# Patient Record
Sex: Female | Born: 1935 | Race: White | Hispanic: No | Marital: Married | Smoking: Never smoker
Health system: Southern US, Community
[De-identification: ages and names within clinical notes are randomized; demographics above are authoritative.]

## PROBLEM LIST (undated history)

## (undated) ENCOUNTER — Emergency Department (HOSPITAL_COMMUNITY): Discharge status: Absent without leave | Payer: Self-pay

## (undated) DIAGNOSIS — I4891 Unspecified atrial fibrillation: Secondary | ICD-10-CM

## (undated) DIAGNOSIS — I350 Nonrheumatic aortic (valve) stenosis: Secondary | ICD-10-CM

## (undated) DIAGNOSIS — E079 Disorder of thyroid, unspecified: Secondary | ICD-10-CM

## (undated) DIAGNOSIS — I340 Nonrheumatic mitral (valve) insufficiency: Secondary | ICD-10-CM

## (undated) DIAGNOSIS — I495 Sick sinus syndrome: Secondary | ICD-10-CM

## (undated) DIAGNOSIS — I441 Atrioventricular block, second degree: Secondary | ICD-10-CM

## (undated) DIAGNOSIS — I4819 Other persistent atrial fibrillation: Secondary | ICD-10-CM

## (undated) DIAGNOSIS — I10 Essential (primary) hypertension: Secondary | ICD-10-CM

## (undated) DIAGNOSIS — M353 Polymyalgia rheumatica: Secondary | ICD-10-CM

## (undated) DIAGNOSIS — H269 Unspecified cataract: Secondary | ICD-10-CM

## (undated) DIAGNOSIS — R2689 Other abnormalities of gait and mobility: Secondary | ICD-10-CM

## (undated) DIAGNOSIS — F99 Mental disorder, not otherwise specified: Secondary | ICD-10-CM

## (undated) DIAGNOSIS — Z87898 Personal history of other specified conditions: Secondary | ICD-10-CM

## (undated) DIAGNOSIS — M1990 Unspecified osteoarthritis, unspecified site: Secondary | ICD-10-CM

## (undated) DIAGNOSIS — C801 Malignant (primary) neoplasm, unspecified: Secondary | ICD-10-CM

## (undated) DIAGNOSIS — Z91013 Allergy to seafood: Principal | ICD-10-CM

## (undated) HISTORY — PX: EXCISION, PILONIDAL CYST: SHX000484

## (undated) HISTORY — PX: BIOPSY, BREAST: SHX000098

## (undated) HISTORY — DX: Unspecified atrial fibrillation: I48.91

## (undated) HISTORY — PX: CHOLECYSTECTOMY: SHX000213

## (undated) HISTORY — DX: Allergy to seafood: Z91.013

## (undated) HISTORY — PX: HYSTERECTOMY: AMBSHX0005

## (undated) HISTORY — PX: TONSILLECTOMY: SHX001830

## (undated) HISTORY — PX: CHOLECYSTECTOMY: SHX55

## (undated) HISTORY — DX: Polymyalgia rheumatica: M35.3

## (undated) HISTORY — DX: Sick sinus syndrome: I49.5

## (undated) HISTORY — DX: Atrioventricular block, second degree: I44.1

## (undated) HISTORY — DX: Nonrheumatic mitral (valve) insufficiency: I34.0

## (undated) HISTORY — DX: Other persistent atrial fibrillation: I48.19

## (undated) HISTORY — DX: Disorder of thyroid, unspecified: E07.9

## (undated) HISTORY — PX: ABDOMINAL HYSTERECTOMY: SHX81

## (undated) HISTORY — PX: PILONIDAL CYST EXCISION: SHX744

## (undated) HISTORY — DX: Unspecified cataract: H26.9

## (undated) HISTORY — DX: Nonrheumatic aortic (valve) stenosis: I35.0

## (undated) HISTORY — PX: APPENDECTOMY: SHX54

## (undated) HISTORY — PX: PACEMAKER IMPLANT: EP1218

---

## 2001-12-02 ENCOUNTER — Ambulatory Visit: Payer: Self-pay

## 2002-01-01 ENCOUNTER — Ambulatory Visit: Payer: Self-pay

## 2002-01-07 ENCOUNTER — Encounter: Payer: Self-pay | Admitting: Family Medicine

## 2002-01-15 ENCOUNTER — Ambulatory Visit: Payer: Self-pay

## 2002-01-15 ENCOUNTER — Encounter: Payer: Self-pay | Admitting: Family Medicine

## 2002-03-02 ENCOUNTER — Ambulatory Visit: Payer: Self-pay

## 2002-04-14 ENCOUNTER — Ambulatory Visit

## 2002-04-17 ENCOUNTER — Other Ambulatory Visit: Payer: Self-pay | Admitting: Family Medicine

## 2002-05-18 ENCOUNTER — Encounter: Payer: Self-pay | Admitting: "Endocrinology

## 2002-05-26 ENCOUNTER — Ambulatory Visit

## 2002-05-28 ENCOUNTER — Other Ambulatory Visit: Payer: Self-pay | Admitting: Family Medicine

## 2002-05-29 ENCOUNTER — Ambulatory Visit

## 2002-06-01 ENCOUNTER — Encounter

## 2002-06-22 ENCOUNTER — Other Ambulatory Visit: Payer: Self-pay | Admitting: "Endocrinology

## 2002-06-22 ENCOUNTER — Encounter: Payer: Self-pay | Admitting: "Endocrinology

## 2002-06-22 ENCOUNTER — Encounter: Admitting: "Endocrinology

## 2002-06-22 ENCOUNTER — Ambulatory Visit

## 2002-06-24 LAB — VITAMIN D, 25 HYDROXY (ARUP): Vitamin D, 25 Hydroxy (ARUP): 25 ng/mL (ref 20–57)

## 2003-01-26 ENCOUNTER — Emergency Department (HOSPITAL_COMMUNITY): Admission: EM | Admit: 2003-01-26 | Discharge: 2003-01-26 | Payer: Self-pay | Admitting: *Deleted

## 2003-10-01 ENCOUNTER — Ambulatory Visit

## 2004-01-04 ENCOUNTER — Encounter: Admitting: "Endocrinology

## 2005-06-25 DIAGNOSIS — E038 Other specified hypothyroidism: Secondary | ICD-10-CM | POA: Insufficient documentation

## 2006-02-18 ENCOUNTER — Encounter: Admission: RE | Admit: 2006-02-18 | Discharge: 2006-02-18 | Payer: Self-pay | Admitting: Neurology

## 2006-11-06 DIAGNOSIS — I4819 Other persistent atrial fibrillation: Secondary | ICD-10-CM | POA: Insufficient documentation

## 2006-11-06 DIAGNOSIS — E7800 Pure hypercholesterolemia, unspecified: Secondary | ICD-10-CM | POA: Insufficient documentation

## 2007-05-05 DIAGNOSIS — I10 Essential (primary) hypertension: Secondary | ICD-10-CM | POA: Insufficient documentation

## 2007-05-05 DIAGNOSIS — I059 Rheumatic mitral valve disease, unspecified: Secondary | ICD-10-CM | POA: Insufficient documentation

## 2007-05-05 DIAGNOSIS — R700 Elevated erythrocyte sedimentation rate: Secondary | ICD-10-CM | POA: Insufficient documentation

## 2007-10-17 DIAGNOSIS — M791 Myalgia, unspecified site: Secondary | ICD-10-CM | POA: Insufficient documentation

## 2008-03-30 DIAGNOSIS — Z7952 Long term (current) use of systemic steroids: Secondary | ICD-10-CM | POA: Insufficient documentation

## 2008-03-30 DIAGNOSIS — M353 Polymyalgia rheumatica: Secondary | ICD-10-CM | POA: Insufficient documentation

## 2008-07-19 DIAGNOSIS — N951 Menopausal and female climacteric states: Secondary | ICD-10-CM | POA: Insufficient documentation

## 2008-11-22 DIAGNOSIS — H269 Unspecified cataract: Secondary | ICD-10-CM | POA: Insufficient documentation

## 2008-11-22 DIAGNOSIS — M7120 Synovial cyst of popliteal space [Baker], unspecified knee: Secondary | ICD-10-CM | POA: Insufficient documentation

## 2009-05-23 DIAGNOSIS — R7301 Impaired fasting glucose: Secondary | ICD-10-CM | POA: Insufficient documentation

## 2010-10-21 NOTE — Progress Notes (Addendum)
June 22, 2002 RE: ALTAMESE, DEGUIRE    MR#: [id number]   DOB: 1933-02-23   Date of Service: 06/22/2002       Lissa Hoard, MD  PCN [facility]    Dear Dr. Frutoso Chase,    Thank you for allowing me to consult with your patient, Brittany Stokes, to comment on treatment options for her goiter and hypothyroidism.    As you know, she is 36 and was diagnosed with hypothyroidism and a goiter in 1996. The patient brought in old records to show on 11/20/94. She had an MRI of her neck that revealed a thyroid that measured 8 cm by 4.5 x 4 cm on the right and 4.5 x 2.5 x 3 cm on the left. She had an I-123 uptake on 11/28/94 that had a borderline high uptake of 29 percent. The distribution of the nuclide in both lobes was non-homogeneous, but there were no focal areas of photopenia, meaning no cold areas. She was started on thyroid hormone replacement and gradually the dosage has been adjusted and she is currently taking Levoxyl 0.112 mg.    She has had periodic ultrasounds of the thyroid and on 07/09/00, the right lobe of her thyroid measured 10.7 x 2.5 x 3.2 cm and on the left it measured 10.2 x 2.5 x 3.7 cm done at [facility] in [place], [place]. She then had a repeat done at [facility] in [place], [place] and her thyroid at that time, on the right measured 7.1 x 3.4 cm, and on the left, it measured 5.7 x 3.2 cm. At neither of the ultrasounds were nodules noted.    The patient is occasionally aware that she has a goiter. If she moves her head in a particular position she has difficulty swallowing. Very rarely she will awake with a choking feeling.    Past Medical History: 1) Macular degeneration. 2) Hypothyroidism. 3) Hypertension. 4) History of atrial fibrillation. 5) Hyperlipidemia.    Past Surgical History: 1) Status post tonsillectomy and adenoidectomy. 2) Status post cholecystectomy. 3) Status post C-section. 4) Status post pilonidal cyst repair. 5) Status post breast biopsy. 6) Status  post TAHBSO.    Family History: Mother died at 84. She had rheumatic valve disease. Father died at 54, he had hypertension and died of alcohol related complications. She has two brothers, both with hypertension. She has a daughter with scleroderma and mixed connective tissue disorder.    Social History: She is married. She splits her time between [place] and [address] [facility]. She is spending more time here in the [facility] area because of her daughter's illness. She does not have a regular exercise program. She is afraid to exercise because of the atrial fibrillation. She does not smoke; she quit in the 30's. She does not drink alcohol.    Medications: Levoxyl 0.112 mg, Vasotec 5 mg, Premarin 0.625 mg, Tenormin 50 mg, vitamin E, vitamin B12, aspirin 81 mg, Xanax 0.25 mg and amiodarone.    Review of Systems: Her weight is stable. Cardiovascular; she has intermittent palpitations, but no recent history of atrial fibrillation. She has intermittent constipation. Mild neck pain. She has vertigo, particularly when she changes her head, more severe recently. The rest of her review of systems is negative.    Physical Exam: Blood pressure 154/74. Pulse 64. Weight 160 pounds. General: She is alert, well-appearing. Skin: Warm and dry, no rash. Neck: The right lobe of her thyroid is two to three times enlarged and measures  6 cm by 4 cm with a measuring tape. The left lobe measures 4 cm by 3 cm. Lungs: Clear, no rales. Heart: Regular rate and rhythm. Abdomen; Soft, nontender no hepatosplenomegaly. Extremities: No edema.    LABORATORY: On 01/08/02, TSH 5.10, free T4 1.24. It was reported to me by the patient that in September of 2003, another physician of the patient's did a TSH and it was 3.07.    ASSESSMENT AND PLAN: 1) Goiter. By measurements done by ultrasound at two different sites, her thyroid size decreased slightly between 2001 and 2002. This may have been operator dependent. My tape measurements of her thyroid are  even smaller than the ultrasound, but I probably am not able to feel the extent and measure the extent of both of her lobes.    Since she did not have a nodule in either of the two previous ultrasounds, and since I cannot feel a nodule, I did not request a repeat ultrasound this year. I will request another ultrasound in November of 2004, but between now and then if her thyroid should increased in size or I can palpate a nodule, I will document this with an ultrasound and perform a fine needle aspiration. Since she has a usually asymptomatic goiter, I would just observe. If she becomes symptomatic could consider surgery or I-131.    2. Hypothyroidism. By reported labs in September of 2003, her TSH was in a reasonable range and she should continue on the Levoxyl 0.112 mg.    3. Health care maintenance. I discussed the importance of exercises. I discussed that the Tenormin and amiodarone should control her heart rate and it is important that she does gradually restart exercising. I also discussed the importance of calcium and vitamin D to prevent osteoporosis. The patient did have a bone density study that was in the normal range in October of 2003.    Because of the history of macular degeneration, I have advised the patient to take lutein and to wear her sunglasses.    Thank you for allowing me to consult. I would like to see your patient in follow-up in six months to check the size of her goiter.    Sincerely,        Gearald Stonebraker Elenor Legato, MD  ASSOCIATE PROFESSOR  DIVISION OF ENDOCRINOLOGY, CLINICAL NUTRITION AND VASCULAR MEDICINE DEPARTMENT OF INTERNAL MEDICINE  THIS WAS ELECTRONICALLY SIGNED - 06/25/2002 12:30 PM PST BY:                   ZOX:[id number])    D: 06/22/2002 10:53 AM  T: 06/23/2002 07:35 PM  C#: [id number]

## 2011-11-09 DIAGNOSIS — Z950 Presence of cardiac pacemaker: Secondary | ICD-10-CM | POA: Insufficient documentation

## 2012-03-19 ENCOUNTER — Encounter (HOSPITAL_COMMUNITY): Payer: Self-pay

## 2012-03-19 ENCOUNTER — Emergency Department (INDEPENDENT_AMBULATORY_CARE_PROVIDER_SITE_OTHER)
Admission: EM | Admit: 2012-03-19 | Discharge: 2012-03-19 | Disposition: A | Discharge status: Home / Self Care | Payer: Medicare Other | Source: Home / Self Care | Attending: Family Medicine | Admitting: Family Medicine

## 2012-03-19 DIAGNOSIS — N390 Urinary tract infection, site not specified: Secondary | ICD-10-CM

## 2012-03-19 HISTORY — DX: Unspecified atrial fibrillation: I48.91

## 2012-03-19 HISTORY — DX: Essential (primary) hypertension: I10

## 2012-03-19 LAB — POCT URINALYSIS DIP (DEVICE): Bilirubin Urine: NEGATIVE

## 2012-03-19 MED ORDER — CEPHALEXIN 500 MG PO CAPS: 500.0000 mg | ORAL_CAPSULE | Freq: Four times a day (QID) | ORAL | Status: AC

## 2012-03-19 NOTE — ED Provider Notes (Signed)
History     CSN: [id number]  Arrival date & time 03/19/12  1505   First MD Initiated Contact with Patient 03/19/12 1610      Chief Complaint  Patient presents with  . Urinary Tract Infection    (Consider location/radiation/quality/duration/timing/severity/associated sxs/prior treatment) Patient is a 78 y.o. female presenting with urinary tract infection. The history is provided by the patient.  Urinary Tract Infection This is a new problem. The current episode started more than 2 days ago. The problem has not changed since onset.Associated symptoms include abdominal pain.    Past Medical History  Diagnosis Date  . Hypertension   . Atrial fibrillation     Past Surgical History  Procedure Date  . Cholecystectomy   . Cesarean section   . Abdominal hysterectomy   . Pilonidal cyst excision     No family history on file.  History  Substance Use Topics  . Smoking status: Never Smoker   . Smokeless tobacco: Not on file  . Alcohol Use: No    OB History    Grav Para Term Preterm Abortions TAB SAB Ect Mult Living                  Review of Systems  Constitutional: Negative.  Negative for fever.  Gastrointestinal: Positive for abdominal pain. Negative for nausea and vomiting.  Genitourinary: Positive for dysuria, urgency and frequency. Negative for flank pain.    Allergies  Epinephrine; Erythromycin; and Tetracyclines & related  Home Medications   Current Outpatient Rx  Name Route Sig Dispense Refill  . ALPRAZOLAM 0.25 MG PO TABS Oral Take 0.25 mg by mouth as needed.    Marland Kitchen DABIGATRAN ETEXILATE MESYLATE 150 MG PO CAPS Oral Take 150 mg by mouth every 12 (twelve) hours.    Marland Kitchen DIGOXIN 0.25 MG PO TABS Oral Take 0.25 mg by mouth daily.    Marland Kitchen ESTROGENS CONJUGATED 0.625 MG PO TABS Oral Take 0.625 mg by mouth daily. Take daily for 21 days then do not take for 7 days.    Marland Kitchen LEVOTHYROXINE SODIUM 125 MCG PO TABS Oral Take 125 mcg by mouth daily.    Marland Kitchen PREDNISONE PO Oral Take 4 mg  by mouth daily.    Marland Kitchen SOTALOL HCL 80 MG PO TABS Oral Take 80 mg by mouth 3 (three) times daily.    . CEPHALEXIN 500 MG PO CAPS Oral Take 1 capsule (500 mg total) by mouth 4 (four) times daily. Take all of medicine and drink lots of fluids 20 capsule 0    BP 136/79  Pulse 99  Temp 98.3 F (36.8 C) (Oral)  Resp 18  SpO2 100%  Physical Exam  Nursing note and vitals reviewed. Constitutional: She is oriented to person, place, and time. She appears well-developed and well-nourished.  HENT:  Head: Normocephalic.  Pulmonary/Chest: Breath sounds normal.  Abdominal: Soft. Bowel sounds are normal. There is no tenderness. There is no rebound.  Neurological: She is alert and oriented to person, place, and time.  Skin: Skin is warm and dry.    ED Course  Procedures (including critical care time)  Labs Reviewed  POCT URINALYSIS DIP (DEVICE) - Abnormal; Notable for the following:    Hgb urine dipstick SMALL (*)     Leukocytes, UA SMALL (*)  Biochemical Testing Only. Please order routine urinalysis from main lab if confirmatory testing is needed.   All other components within normal limits  LAB REPORT - SCANNED   No results found.   1.  UTI (lower urinary tract infection)       MDM          Linna Hoff, MD 03/24/12 [phone]

## 2012-03-19 NOTE — ED Notes (Addendum)
When I went in to discharge pt, she stated she was concerned about her heart rate being in the 40s- states she is concerned about this because she has pacemaker with rate set in the 70's.  Discussed with pt that her heart rate was in the 90's when checked and was not aware of it being that low.  Pt states that when EMT took her vitals that it read in the 40's on the monitor - all the while her husband is asking if they can leave.  Spoke with Red Christians who states that pulse ox was reading 40 BPM but when she palpated the pulse she got 74 but irregular and the pulse ox subsequently read in the 90s.  Explained to pt that pulse ox will not always be accurate with an irregular heart like atrial fib.which is why Kristen then checked it manually.  Spoke with Dr Artis Flock who states he discussed this with pt, palpated her pulse and auscultated her heart- states he reassured her although heart rate is irregular as expected with atrial fib, he found nothing to be concerned about.

## 2012-03-19 NOTE — ED Notes (Signed)
C/o urinary frequency, suprapubic pressure and dysuria for 1 week.  Pt is here visiting family.

## 2014-09-13 DIAGNOSIS — I5030 Unspecified diastolic (congestive) heart failure: Secondary | ICD-10-CM | POA: Insufficient documentation

## 2015-03-14 DIAGNOSIS — R350 Frequency of micturition: Secondary | ICD-10-CM | POA: Insufficient documentation

## 2015-03-14 DIAGNOSIS — N390 Urinary tract infection, site not specified: Secondary | ICD-10-CM | POA: Insufficient documentation

## 2015-05-16 DIAGNOSIS — Z7901 Long term (current) use of anticoagulants: Secondary | ICD-10-CM | POA: Insufficient documentation

## 2015-09-08 DIAGNOSIS — E042 Nontoxic multinodular goiter: Secondary | ICD-10-CM | POA: Insufficient documentation

## 2015-09-16 ENCOUNTER — Other Ambulatory Visit: Payer: Self-pay | Admitting: Cardiovascular Disease

## 2015-09-16 DIAGNOSIS — I340 Nonrheumatic mitral (valve) insufficiency: Secondary | ICD-10-CM | POA: Insufficient documentation

## 2015-09-19 ENCOUNTER — Encounter: Payer: Self-pay | Admitting: Cardiovascular Disease

## 2015-09-27 ENCOUNTER — Encounter: Payer: Self-pay | Admitting: Cardiovascular Disease

## 2015-09-27 ENCOUNTER — Ambulatory Visit
Admission: RE | Admit: 2015-09-27 | Discharge: 2015-09-27 | Disposition: A | Discharge status: Home / Self Care | Payer: Medicare Other | Source: Ambulatory Visit | Attending: Cardiovascular Disease | Admitting: Cardiovascular Disease

## 2015-09-27 ENCOUNTER — Ambulatory Visit (HOSPITAL_BASED_OUTPATIENT_CLINIC_OR_DEPARTMENT_OTHER): Payer: Medicare Other | Admitting: Cardiovascular Disease

## 2015-09-27 ENCOUNTER — Ambulatory Visit (HOSPITAL_BASED_OUTPATIENT_CLINIC_OR_DEPARTMENT_OTHER): Payer: Medicare Other

## 2015-09-27 DIAGNOSIS — I4891 Unspecified atrial fibrillation: Secondary | ICD-10-CM | POA: Insufficient documentation

## 2015-09-27 DIAGNOSIS — Z45018 Encounter for adjustment and management of other part of cardiac pacemaker: Secondary | ICD-10-CM

## 2015-09-27 DIAGNOSIS — I351 Nonrheumatic aortic (valve) insufficiency: Principal | ICD-10-CM | POA: Insufficient documentation

## 2015-09-27 DIAGNOSIS — I340 Nonrheumatic mitral (valve) insufficiency: Principal | ICD-10-CM

## 2015-09-27 LAB — ECHOCARDIOGRAM COMPLETE
IVSD 2D: 0.94 cm (ref 0.6–0.9)
LEFT INTERNAL DIMENSION IN SYSTOLE: 2.4 cm
LEFT VENTRICULAR INTERNAL DIMENSION IN DIASTOLE: 4.32 cm (ref 3.8–5.2)
LVEF (EST): 45 %
MV VALVE AREA P 1/2 METHOD: 4.57 cm2
POSTERIOR WALL: 0.74 cm (ref 0.6–0.9)
RV D(2D): 2.68 cm (ref 1.9–?)
TAPSE: 1.8 cm
TV PEAK SYSTOLIC PULMONARY ARTERY PRESSURE: 35.15 mmHg

## 2015-09-27 NOTE — Progress Notes (Signed)
Date of Service: 09/27/2015    Following MD: Dr. [name]  Implanting MD: Dr. Aileen Fass  Referring MD: Dr. Zenaida Deed    Diagnosis: I49.5 Sick Sinus Syndrome / Brady-Tachy    Device:  [place] [id number] ADVANTIO  Implant date: 10/16/2011  RA Lead: [place] Model 4469 [place] II Sterox EZ  Implant date: 10/16/2011  RV Lead: [place] Model 4470 [place] II Sterox EZ  Implant date: 10/16/2011  Underlying rhythm: AFib, Vent rate at 44 bpm    Settings:  Mode DDDR  Lower rate 75 bpm  Max tracking rate 130 bpm  Brady max sensor rate 130 bpm  AV delay 350.0/350.0 ms  RA sensing sensitivity 0.2 mV  RV sensing sensitivity 0.6 mV  RA pacing polarity: Bipolar  RV pacing polarity: Bipolar  RA pacing: Amplitude 2.0 V @ Pulse Width 0.4 ms  RV pacing: Amplitude 1.2 V @ Pulse Width 0.4 ms    RA Test:  RA impedance 580.0 ohms  RA sensing 2.4 mV  RA pacing threshold: Amplitude AFib  @ Pulse Width      RV Test:  RV impedance 556.0 ohms  RV sensing 18.7 mV  RV pacing threshold: Amplitude 0.6 V @ Pulse Width 0.4 ms    Battery:  Battery status MOS  Battery remaining longevity 3 years   Battery at [employer] 1     Percent Paced:  RA percent paced 1 %  RV percent paced 94 %

## 2015-09-27 NOTE — Progress Notes (Signed)
Gearlene Deasy was seen in Dr. Keturah Barre clinic today and had requested a device check today.  Patient has a Holiday representative.    Presenting Rhythm: AFib, Vp  Underlying Rhythm: AFib, ventricular rate at 44 bpm  Device function is normal, Lead function is normal  Battery status is good, estimating 3 years  Arrhythmias/Comments:   1 event in the NSVT bin. EGM shows AFib with RVR at 180 bpm for 10 seconds. According to the patient, this event occurred on the same day that he daughter passed away and she was very distraught which may have caused the tachycardia.  21 ATR events, totaling 99%. EGMs show AFib.  Patient is anti-coagulated    She was implanted by Dr. Davina Poke at [facility] and wants to establish care here. I will have her remote checks transferred to our clinic.    Dr. Candiss Norse was notified of the results.  Next scheduled office visit is in one year and next remote follow up is in 3 months  Interrogated by: Antoine Poche, MS, HLT II    Dr. Candiss Norse (HLT II supervising)  Department of Internal Medicine, Division of Cardiology

## 2015-09-27 NOTE — Nursing Note (Signed)
Pt identity verified X 2. Vitals checked. Allergies reviewed and verified. Pharmacy verified, EKG performed.    Gracelyn Coventry, MAII

## 2015-09-28 ENCOUNTER — Telehealth: Payer: Self-pay | Admitting: Cardiovascular Disease

## 2015-09-28 ENCOUNTER — Telehealth: Payer: Self-pay | Admitting: Physician Assistant

## 2015-09-28 LAB — POC ELECTROCARDIOGRAM WITH RHYTHM STRIP: QTC: 495

## 2015-09-28 NOTE — Telephone Encounter (Signed)
CALLED PATIENT TO SET-UP HER TEE FOR 10/10/15 @1100 

## 2015-09-28 NOTE — Telephone Encounter (Signed)
Patient was seen yesterday and requested that we follow her Latitude monitor now for her pacemaker. She stated she was followed by [facility]. I called [facility] and spoke to [place] in the pacemaker clinic. He stated they do not have her on their Latitude and have not seen her in years. I tried to enroll her on the web site however it stated there was an error with her information. I called [employer] support and had them look up her account. They stated she is followed by [facility] and the birthday in her account is incorrect. I was able to use the incorrect birthday and transfer her care to our clinic. I corrected the birthday once she was transferred. I also e-mailed Junior at [facility] to let him know we are following this patient. I called the patient to let her know she is on our account now.  Antoine Poche, MS, HLT II

## 2015-09-29 ENCOUNTER — Telehealth: Payer: Self-pay | Admitting: Cardiovascular Disease

## 2015-09-29 NOTE — Telephone Encounter (Signed)
PT CALL TO R/S HER TEE FM 10/10/15 TO 11/10/15 @1100 
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
## 2015-10-05 ENCOUNTER — Encounter: Payer: Self-pay | Admitting: Cardiovascular Disease

## 2015-10-05 NOTE — Progress Notes (Addendum)
Latitude Remote, reviewed on 10/05/15.  Idellar Stocks had a unscheduled remote transmission.   Patient has a Engineer, site [phone] dual chamber pacemaker.    Device function is Normal, Lead function is Normal  Battery status is good estimated longevity is 3 years.  Presenting EGM: Afib/Vp at 76bpm    Arrhythmias/Comments:  Chronic PAF. Per Dr Frederich Chick Singh's last note on 09/27/15,  the  PT is taking Digoxin, Sotalol and Pradaxa.    Next scheduled remote is in May  and office visit is yearly in February.   Interrogated by: Rulon Abide HLT II    [name] MD (HLT II supervising)  Department of Internal Medicine, Division of Cardiology    I have personally reviewed the report and download and agree with the findings.  [name], P.A  Department of Internal Medicine,  Division of Cardiology.

## 2015-10-06 NOTE — Progress Notes (Signed)
Date of Service: 10/05/2015    Following MD: Dr. [name]  Implanting MD: Dr. Aileen Fass  Referring MD: Dr. Zenaida Deed    Diagnosis: I49.5 Sick Sinus Syndrome / Brady-Tachy    Device:  [place] [id number] ADVANTIO  Implant date: 10/16/2011  RA Lead: [place] Model 4469 [place] II Sterox EZ  Implant date: 10/16/2011  RV Lead: [place] Model 4470 [place] II Sterox EZ  Implant date: 10/16/2011  Underlying rhythm: afibb    Settings:  Mode DDDR  Lower rate 75 bpm  Max tracking rate 130 bpm  Brady max sensor rate 130 bpm  AV delay 350/350 ms  RA sensing sensitivity 0.2 mV  RV sensing sensitivity 0.6 mV  RA pacing polarity: Bipolar  RV pacing polarity: Bipolar  RA pacing: Amplitude 2.0 V @ Pulse Width 0.4 ms  RV pacing: Amplitude 1.1 V @ Pulse Width 0.4 ms    RA Test:  RA impedance 578 ohms  RA pacing threshold: Amplitude 0.5 V @ Pulse Width 0.4 ms    RV Test:  RV impedance 544 ohms  RV pacing threshold: Amplitude 0.7 V @ Pulse Width 0.4 ms    Battery:  Battery status BOS  Battery remaining longevity 36 months    Percent Paced:  RA percent paced 0 %  RV percent paced 99 %

## 2015-10-13 NOTE — Progress Notes (Signed)
The following instructions were sent to patient:    Name: Keyanna Cannella    [place]:     Transesophageal echo    On  11/09/2015  Arrival time 1100    AT [facility], [address], [place]. [address]  When you arrive at the hospital go straight to admissions at 1P214 in the [facility] and check in then you will be going to [place] [facility] to get ready for the procedure.     Wheelchairs are available at the [employer] and in the Parking Structure  Patients receive ONE parking voucher on the day of the procedure                   YPRE-PROCEDURE INSTRUCTIONS     Do not eat or drink after midnight the night before the procedure         May take your medications you normally take in the morning with sips of water, except for  any diuretics (water pills)  and any blood sugar pills  you are on.     You will receive a call from the Nurse coordinator before the day of your procedure      YON THE [place] will be here for 3 to 4 hours and then will be discharged home       You need to have someone bringing you and taking you home    ANY QUESTIONS OR CONCERNS:     Call Nurse Coordinator at [phone] or Scheduler at [phone]

## 2015-10-28 ENCOUNTER — Encounter: Payer: Self-pay | Admitting: Cardiovascular Disease

## 2015-10-28 DIAGNOSIS — I340 Nonrheumatic mitral (valve) insufficiency: Principal | ICD-10-CM

## 2015-10-28 NOTE — Progress Notes (Addendum)
Latitude  Remote, reviewed on 10/28/15.  Brittany Stokes had a unscheduled remote transmission.   Patient has a Engineer, site [phone] dual chamber pacemaker.    Device function is Normal, Lead function is Normal  Battery status is good  Presenting EGM: Afib Vp at 78bpm    Arrhythmias/Comments: Afib 100% of the time. Pr takes Digoxin, Sotalol and Pradaxa per Dr Ledell Noss last note.  Pt is pacaed 99% of the time in the RV.  Echo  EF=45% per Dr Ledell Noss last note.  Next scheduled remote is in June and office visit is  Yearly in February.   Interrogated by: Rulon Abide    Needs to establish with one of our EP or pacer cardiologists for [place] to follow her remotes.  I have personally reviewed the report and download and agree with the findings.  [name], P.A  Department of Internal Medicine,  Division of Cardiology.

## 2015-10-28 NOTE — Progress Notes (Signed)
Date of Service: 10/28/2015    Following MD: Dr. [name]  Implanting MD: Dr. Aileen Fass  Referring MD: Dr. Zenaida Deed    Diagnosis: I49.5 Sick Sinus Syndrome / Brady-Tachy    Device:  [place] [phone] ADVANTIO  Implant date: 10/16/2011  RA Lead: [place] Model 4469 [place] II Sterox EZ  Implant date: 10/16/2011  RV Lead: [place] Model 4470 [place] II Sterox EZ  Implant date: 10/16/2011  Underlying rhythm: afib    Settings:  Mode DDDR  Lower rate 75 bpm  Max tracking rate 130 bpm  Brady max sensor rate 130 bpm  AV delay 350/350 ms  RA sensing sensitivity 0.2 mV  RV sensing sensitivity 0.6 mV  RA pacing polarity: Bipolar  RV pacing polarity: Bipolar  RA pacing: Amplitude 2.0 V @ Pulse Width 0.4 ms  RV pacing: Amplitude 1.2 V @ Pulse Width 0.4 ms    RA Test:  RA impedance 580 ohms  RA pacing threshold: Amplitude 0.5 V @ Pulse Width 0.4 ms    RV Test:  RV impedance 558 ohms  RV pacing threshold: Amplitude 0.7 V @ Pulse Width 0.4 ms    Battery:  Battery status BOS  Battery remaining longevity 36 months    Percent Paced:  RA percent paced 0 %  RV percent paced 99 %

## 2015-10-30 NOTE — Progress Notes (Signed)
Ok to place referral.    gs

## 2015-10-31 NOTE — Progress Notes (Signed)
Referral ordered.    Adelaine Roppolo, RN

## 2015-10-31 NOTE — Addendum Note (Signed)
Addended byLindie Spruce on: 10/31/2015 11:42 AM     Modules accepted: Orders

## 2015-11-08 ENCOUNTER — Telehealth: Payer: Self-pay

## 2015-11-08 NOTE — H&P (Addendum)
CARDIOLOGY ADMIT NOTE    Scheduled Procedure: TEE   Date: 11/09/15  Cardiologist: Candiss Norse MD    Preoperative dx:  Mitral Regurg     HPI:  Brittany Stokes is a 59yr female with paroxymsal A Fib, SSS s/p PPM, IBM, PMR, HTN who was evaluated by Dr. Candiss Norse for MitraClip given her severe MR 2/2 degenerative mitral disease. She is referred for TEE as part of her work-up.      Denies hest pain, chest tightness, LE edema, orthopnea, or paroxysmal nocturnal dyspnea. Patient unaware of palpitations, dizziness, or syncope. Patient has never been told of angina pectoris or prior myocardial infarction. She does report dyspnea with minimal exertion and is quite anxious about the procedure.     PAST MEDICAL HISTORY:   Severe MR  Paroxysmal A Fib  HTN  PMR  IBM  SSS s/p PPM    CURRENT PROBLEM LIST:   Patient Active Problem List    Diagnosis Date Noted    Mitral regurgitation 09/16/2015       ALLERGIES:     Ciprofloxacin    Unknown-Explain in Comments  Epinephrine    Tachycardia  Erythromycin    N/A  Shellfish Containing Products    Angioedema  Tetracycline    Unknown-Explain in Comments    MEDICATIONS:   Sotalol 80mg  po TID  Digoxin  Prednisone   Pradaxa    FAMILY HISTORY: NC    SOCIAL HISTORY:   Denies EtOH, tobacco or illicit drug use. Lives with husband.   (former smoker->40 yrs ago)  3 children - one daughter recently died.  REVIEW OF SYSTEMS:    General review negative except for the above noted.  Including absence of fever, chills, nausea, vomiting, night sweats, decreased appetite, recent weight loss, generalized bone pain, or new onset headaches.                    PHYSICAL EXAMINATION:    Pre-Sedation Assessment:  Airway:  Mallampati class 2  Neck:  ROM normal  Lungs:  normal  Card:  irregular    ASA Status: II  Vital Signs:   Temp: --  Temp src: --  Pulse: 81 (03/29 1135)  BP: 160/80 (03/29 1135)  Resp: 20 (03/29 1135)  SpO2: 99 % (03/29 1135)  Height: 162.6 cm (5\' 4" ) (03/29 1135)  Weight: 68.1 kg (150 lb 3.2 oz) (03/29  1135)    General:NAD, alert, orientated, cooperative    Neuro: Grossly intact. Non-focal    HEENT: Normocephalic, sclera clear, pink/moist oral pharynx.    Neck: No JVD or bruits. Supple without adenopathy.    Cardiac: S1, S2 with holosystolic murmur heard best at apex. No rubs, gallops.    Lungs: normal    Abdomen:  Soft, NT, +BS, no hepatospleenomegaly    Upper Extremities:  +2 radial and brachial pulses. No edema.    Lower Extremities:  Groin with +2 femoral pulses, negative bruits. Sensation intact.    Right: +2 DP and +2 PT pulses           Left:   +2 DP and +2 PT pulses   No pedal edema, legs are warm.    Skin:  Intact     LABS:  Lab Results   Lab Name Value Date/Time    WBC 6.7 06/22/2002 10:03 AM    HGB 13.0 06/22/2002 10:03 AM    HCT 37.9 06/22/2002 10:03 AM    PLT 331 06/22/2002 10:03 AM     No results  found for: NA, K, CL, CO2, BUN, CR, GLU  No components found for: HGHA1C  INR   No results found for this basename: INR:* in the last 72 hours   CrCl cannot be calculated (Patient has no serum creatinine result on file.).  No results found for: CHOL, LDLC, HDL, TRIG  No results found for: AST, ALT, ALP, ALB, TP, TBIL  THYROID STIMULATING HORMONE (mcIU/mL)   Date Value   06/22/2002 4.01     THYROXINE, FREE (FREE T4) (ng/dL)   Date Value   06/22/2002 1.21     No results found for: HSCRP  SED RATE WESTERGREN (no units)   Date Value   06/22/2002 44       RADIOGRAPHIC FINDINGS:     ECHO:  09/27/15  1. Apical septal segment, apical inferior segment, and apex are abnormal as described below.  2. The LV systolic function is mildly reduced. The estimated LV ejection fraction is 45%.  3. Trivial pericardial effusion.  4. Mild to moderate aortic valve sclerosis/calcification without any evidence of aortic stenosis.  5. The left ventricular diastolic function is normal.  6. Hypokinetic right ventricular apex.  7. Pacing wire/catheter visualized in the right atrium and Pacing wire/catheter visualized in the   right  ventricle.  8. The left atrium is mildly dilated by LA volume.  9. Mildly dilated right atrium.  10. Mitral regurgitation vena contracta is 0.5 cm.  11. 2+ regurgitation of the tricuspid valve.  12. 3+ regurgitation of the mitral valve.    EKG:   Pre-Procedure  V paced    IMPRESSION AND PLAN:  73yr female scheduled for TEE    We have discussed the benefits, risks, and alternatives regarding procedure.  Patient wishes to proceed forward with the procedure and has signed the consent freely. We have discussed and answered all the patient's questions, and agrees with the surgical plan.    Kizzie Ide, MD  Clinical Fellow  Cardiovascular Medicine  Pager [phone]    Attending note:  I  discussed with fellow and spoke with patient.  Followed by rheumatology for PMR.  Has been given script for furosemide but does not use it.  No h/o MI.  Told of recent decrease in LVEF.  Evaluating MR by Dr. Glynn Octave.  No dysphagia or GI bleeding.  Chews food well.  Has AF on pradaxa and PPM and mainly paced on current monitoring.  States she previously had HTN but checks bp at home and not elevated. Initially BP 160/90 here.        Roberto Scales, M.D.  Attending  Pgr [phone]

## 2015-11-08 NOTE — Telephone Encounter (Addendum)
Called and spoke to patient about her Transesophageal echo scheduled on 11/09/15 . Patient is to arrive at 1100 at admissions on the first floor of the hospital in the [facility] [facility]. NPO after midnight the night before. May take medications in the morning that she normally takes with sips of water except for any diuretics or blood sugar pills she is on. Will be here about 3 to 4 hours then will be sent home. Will need a ride to and from the hospital. Patient understands instruction and will call if any other questions. Patient is very anxious about the procedure, she states that she doesn't swallow pills well so she is not sure how this is going to work. States she has a muscle condition that is a cause of this. Dr. Candiss Norse order the TEE and she stated she did tell him but also did talk to him about a lot of things that day. So it is noted and I told her that the TEE department does look at history and notes from Dr. Candiss Norse usually the day prior to the procedure. Also patient stated she has a pacemaker and has Latitude wanted to know if she could download manually today. Yesterday coming back from her cruise she stated she felt sick and that was about 6:00pm. She wanted to see if her device captured anything, checked her pulse rate and it was she thought in the 150s. Also she is wondering if her electrolytes are off. She really tried hard to watch her diet on the cruise.  I let her know that she can download her device and I will call the device clinic and let them know why you downloaded. Added BMP to patients procedure.     Amount of time spent educating patient: 15 minutes    Education     Title: Generic Teaching Goals/Outcomes (Resolved)     Points For This Title     Point: Diagnostic Tests/Procedures (Resolved)    Description: Diagnostic Tests/Procedures    Learning Progress Summary    Learner Readiness Method Response Comment Documented by Status   Patient Acceptance E VU see EMR note DB 11/08/15 1446 Done                Point: Dietary Modifications (Resolved)    Description: Dietary Modifications    Learning Progress Summary    Learner Readiness Method Response Comment Documented by Status   Patient Acceptance E VU see EMR note DB 11/08/15 1446 Done               Point: Medications (Resolved)    Description: Reason for the medication, significant clinical side effects, answer any questions prior to first administration.  Enter name of the new medication in the comments section.    Learning Progress Summary    Learner Readiness Method Response Comment Documented by Status   Patient Acceptance E VU see EMR note DB 11/08/15 1446 Done               Point: Room/Orientation (Resolved)    Description: Room/Orientation    Learning Progress Summary    Learner Readiness Method Response Comment Documented by Status   Patient Acceptance E VU see EMR note DB 11/08/15 1446 Done                      User Key     Initials Effective Dates Name Provider Type Discipline    DB 04/14/15 -  Jerrell Belfast, RN .NURSE: Investment banker, corporate or  LVN) Interdisciplinary

## 2015-11-09 ENCOUNTER — Ambulatory Visit: Payer: Medicare Other

## 2015-11-09 ENCOUNTER — Ambulatory Visit
Admission: RE | Admit: 2015-11-09 | Discharge: 2015-11-09 | DRG: 307 | Disposition: A | Discharge status: Home / Self Care | Payer: Medicare Other | Source: Ambulatory Visit | Attending: Internal Medicine | Admitting: Internal Medicine

## 2015-11-09 ENCOUNTER — Ambulatory Visit: Payer: Medicare Other | Attending: Cardiovascular Disease

## 2015-11-09 DIAGNOSIS — Z950 Presence of cardiac pacemaker: Secondary | ICD-10-CM | POA: Insufficient documentation

## 2015-11-09 DIAGNOSIS — I081 Rheumatic disorders of both mitral and tricuspid valves: Secondary | ICD-10-CM

## 2015-11-09 DIAGNOSIS — Z45018 Encounter for adjustment and management of other part of cardiac pacemaker: Principal | ICD-10-CM | POA: Insufficient documentation

## 2015-11-09 DIAGNOSIS — I340 Nonrheumatic mitral (valve) insufficiency: Secondary | ICD-10-CM | POA: Insufficient documentation

## 2015-11-09 DIAGNOSIS — I480 Paroxysmal atrial fibrillation: Secondary | ICD-10-CM | POA: Insufficient documentation

## 2015-11-09 DIAGNOSIS — I10 Essential (primary) hypertension: Secondary | ICD-10-CM | POA: Insufficient documentation

## 2015-11-09 HISTORY — DX: Unspecified osteoarthritis, unspecified site: M19.90

## 2015-11-09 HISTORY — DX: Essential (primary) hypertension: I10

## 2015-11-09 HISTORY — DX: Malignant (primary) neoplasm, unspecified: C80.1

## 2015-11-09 HISTORY — DX: Other abnormalities of gait and mobility: R26.89

## 2015-11-09 LAB — BASIC METABOLIC PANEL
CALCIUM: 9.5 mg/dL (ref 8.6–10.5)
CARBON DIOXIDE TOTAL: 24 meq/L (ref 24–32)
CHLORIDE: 106 meq/L (ref 95–110)
CREATININE BLOOD: 0.45 mg/dL (ref 0.44–1.27)
GLUCOSE: 86 mg/dL (ref 70–99)
POTASSIUM: 4 meq/L (ref 3.3–5.0)
SODIUM: 139 meq/L (ref 135–145)
UREA NITROGEN, BLOOD (BUN): 11 mg/dL (ref 8–22)

## 2015-11-09 MED ORDER — EPINEPHRINE 0.1 MG/ML INJECTION SYRINGE
1.0000 mg | INJECTION | INTRAMUSCULAR | Status: DC | PRN
Start: 2015-11-09 — End: 2015-11-09

## 2015-11-09 MED ORDER — LIDOCAINE HCL 2 % MUCOSAL SOLUTION
5.0000 mL | Freq: Once | Status: AC
Start: 2015-11-09 — End: 2015-11-09
  Administered 2015-11-09: 5 mL via OROMUCOSAL
  Filled 2015-11-09: qty 15

## 2015-11-09 MED ORDER — MIDAZOLAM (PF) 1 MG/ML INJECTION SOLUTION
0.5000 mg | INTRAMUSCULAR | Status: DC | PRN
Start: 2015-11-09 — End: 2015-11-09
  Administered 2015-11-09: 2 mg via INTRAVENOUS
  Filled 2015-11-09: qty 8

## 2015-11-09 MED ORDER — FENTANYL (PF) 50 MCG/ML INJECTION SOLUTION
25.0000 ug | INTRAMUSCULAR | Status: DC | PRN
Start: 2015-11-09 — End: 2015-11-09
  Administered 2015-11-09: 75 ug via INTRAVENOUS
  Filled 2015-11-09: qty 4

## 2015-11-09 MED ORDER — NACL 0.9% IV INFUSION
INTRAVENOUS | Status: DC
Start: 2015-11-09 — End: 2015-11-09

## 2015-11-09 MED ORDER — LIDOCAINE HCL 10 MG/ML (1 %) INJECTION SOLUTION
3.0000 mL | Freq: Once | INTRAMUSCULAR | Status: AC
Start: 2015-11-09 — End: 2015-11-09
  Administered 2015-11-09: 10 mL via TOPICAL
  Filled 2015-11-09: qty 20

## 2015-11-09 MED ORDER — LIDOCAINE HCL 2 % MUCOSAL JELLY
Freq: Once | Status: AC
Start: 2015-11-09 — End: 2015-11-09
  Administered 2015-11-09: 5 mL via TOPICAL
  Filled 2015-11-09: qty 5

## 2015-11-09 MED ORDER — ATROPINE 0.1 MG/ML INJECTION SYRINGE
0.5000 mg | INJECTION | INTRAMUSCULAR | Status: DC | PRN
Start: 2015-11-09 — End: 2015-11-09
  Filled 2015-11-09: qty 10

## 2015-11-09 MED ORDER — NALOXONE 0.4 MG/ML INJECTION SOLUTION
0.1000 mg | INTRAMUSCULAR | Status: DC | PRN
Start: 2015-11-09 — End: 2015-11-09

## 2015-11-09 MED ORDER — FLUMAZENIL 0.1 MG/ML INTRAVENOUS SOLUTION
0.2000 mg | INTRAVENOUS | Status: DC | PRN
Start: 2015-11-09 — End: 2015-11-09

## 2015-11-09 MED ORDER — DIPHENHYDRAMINE 50 MG/ML INJECTION SOLUTION
25.0000 mg | INTRAMUSCULAR | Status: DC | PRN
Start: 2015-11-09 — End: 2015-11-09
  Filled 2015-11-09: qty 1

## 2015-11-09 MED ORDER — ONDANSETRON HCL (PF) 4 MG/2 ML INJECTION SOLUTION
4.0000 mg | INTRAMUSCULAR | Status: DC | PRN
Start: 2015-11-09 — End: 2015-11-09
  Filled 2015-11-09: qty 2

## 2015-11-09 NOTE — Nurse Focus (Signed)
Patient arrived to [place] via gurney. Family member at bedside. VSS, RA, tele: paced. Patient sleepy. Arouses easily when name is called. Answers questions appropriately. Continue to monitor. Sherri Rad RN

## 2015-11-09 NOTE — Nurse Procedure (Addendum)
Patient is brought to mini EP lab by gurney for TEE. Patient is alert and awake. Verbalizes understanding about the procedure. ID band checked.Patient is placed on continuous cardiac monitoring. O2 started by nasal canula 2L per min. Suction at bedside. Dr Donata Clay explained the procedure to the patient. Consent available. Air way assessment completed. Lidocaine 1% throat spray applied. Sedation started with versed and fentanyl. Bite block placed. Patient is positioned on left lateral position.Scope inserted by DR. Aman with ease. Procedure completed with out any complications. Findings: moderate to severe MR    RN Transfer report for TEE and Cardioversion:    S/P TEE  Total medication received:  Fentanyl:    75 mcg                                      Last dose @ 1353  Versed          2 mg                                       Last dose @ 1350    Other  Probe out @ 1415  Last vital signs: see emr  Patient able to get out of bed @ 1615 / patient able to eat soft liquids @ 1615 and solids at 1815  Treatment plan:   Procedure physician information:  [place]   Patient is transferred to [place] Report given to Ruso

## 2015-11-09 NOTE — Discharge Instructions (Signed)
If you had Sedation with your Procedure;      WATCH FOR COMMON SIDE EFFECTS OF SEDATION FOR 24 HOURS    Dizziness    Confusion    Unsteadiness of gait    Altered vision       DO NOT DO THESE ACTIVITES FOR 24 HOURS    Drive as a driver    Do not Drink alcoholic beverages    Do not stay alone if possible     POST-PROCEDURE WATCH FOR THE FOLLOWING    Shortness of breath    Chest pain    Racing heart    Dizziness    Nausea, vomiting     Sweating, Claminess       No solid food for 2 hours after your procedure (0615 pm today). Start with soft foods and advance slowly to your usual diet. Use caution when sipping liquids as your gag reflex can be altered by the local anesthesia used on your throat during your procedure.

## 2015-11-09 NOTE — Nurse Discharge Note (Signed)
DISCHARGE NOTE:    Patient has been discharged home with all instructions regarding changes to medication regiment and diet, activity limitation as related to procedure, and sedation received, all follow up care, and when to seek medical assistance or advice. Patient and family verbalize understanding of all d/c instructions. Discharge paperwork given to patient. PIV discontinued. Patient sent home in stable condition with family.

## 2015-11-10 ENCOUNTER — Encounter: Payer: Self-pay | Admitting: Physician Assistant

## 2015-11-10 ENCOUNTER — Encounter: Payer: Self-pay | Admitting: Cardiovascular Disease

## 2015-11-10 NOTE — Progress Notes (Signed)
Turned AFib burden alert off on Latitude per Truitt Leep, PA and Dr. Anice Paganini Cisneros-Fong, MS, HLT II

## 2015-11-10 NOTE — Progress Notes (Addendum)
Latitude Remote, reviewed on 11/10/15.  Brittany Stokes had a unscheduled remote transmission.   Patient has a Engineer, site [phone] dual chamber pacemaker.    Device function is Normal, Lead function is Normal  Battery status is  Good estimated longevity is 3 years  Presenting EGM: Afib/Vp At 50bpm    Arrhythmias/Comments: Chronic Afibb. Pt takes Digoxin Sotalol and Pradaxa  Pt is paced in the RV 99% of the time.  TEE on 11/09/15 EF was 50%.    Next scheduled remote is in June and office visit is in February.   Interrogated by: Rulon Abide HLT II    [name] MD (HLT II supervising)  Department of Internal Medicine, Division of Cardiology    Some atrial undersensing with the auto sensing.  May need to bring her in and change to more sensitive and static.  I have personally reviewed the report and download and agree with the findings.  [name], P.A  Department of Internal Medicine,  Division of Cardiology.

## 2015-11-11 LAB — TEE TRANSESOPHAGEAL ECHOCARDIOGRAM: LVEF (EST): 50 %

## 2015-11-11 NOTE — Progress Notes (Signed)
Date of Service: 11/09/2015    Following MD: Dr. [name]  Implanting MD: Dr. Aileen Fass  Referring MD: Dr. Zenaida Deed    Diagnosis: I49.5 Sick Sinus Syndrome / Brady-Tachy    Device:  [place] [phone] ADVANTIO  Implant date: 10/16/2011  RA Lead: [place] Model 4469 [place] II Sterox EZ  Implant date: 10/16/2011  RV Lead: [place] Model 4470 [place] II Sterox EZ  Implant date: 10/16/2011  Underlying rhythm: afibb    Settings:  Mode DDDR  Lower rate 75 bpm  Max tracking rate 130 bpm  Brady max sensor rate 130 bpm  AV delay 350.0/350.0 ms  RA sensing sensitivity 0.2 mV  RV sensing sensitivity 0.6 mV  RA pacing polarity: Bipolar  RV pacing polarity: Bipolar  RA pacing: Amplitude 2.0 V @ Pulse Width 0.4 ms  RV pacing: Amplitude 1.2 V @ Pulse Width 0.4 ms    RA Test:  RA impedance 506 ohms  RA sensing 1.2 mV    RV Test:  RV impedance 511 ohms  RV sensing paced   RV pacing threshold: Amplitude 0.7 V @ Pulse Width 0.40 ms    Battery:  Battery remaining longevity 36 months    Percent Paced:  RA percent paced 0 %  RV percent paced 99 %

## 2015-11-11 NOTE — Progress Notes (Addendum)
Latitude Remote, reviewed on 11/09/15.  Brittany Stokes had a scheduled remote transmission.   Patient has a [place] dual IT trainer.    Device function is Normal, Lead function is Normal  Battery status is Good  Presenting EGM: Afibb/ Vp at 75bpm    Arrhythmias/Comments: Chronic afib. Pt takes Digoxin Sotalol and Pradaxa.Pt called in saying she felt ill on 11/08/15 and wondered if her heart race had increased on that day around 6:00pm. No arrhythmia was noted.    Next scheduled remote is in June  and office visit is yearly in February.   Interrogated by: Rulon Abide HTL II    [name] MD (HLT II supervising)  Department of Internal Medicine, Division of Cardiology

## 2015-11-12 NOTE — Progress Notes (Signed)
I have personally reviewed the report and agree with the findings.

## 2015-11-14 NOTE — Progress Notes (Signed)
Date of Service: 11/10/2015    Following MD: Dr. [name]  Implanting MD: Dr. Aileen Fass  Referring MD: Dr. Zenaida Deed    Diagnosis: I49.5 Sick Sinus Syndrome / Brady-Tachy    Device:  [place] [phone] ADVANTIO  Implant date: 10/16/2011  RA Lead: [place] Model 4469 [place] II Sterox EZ  Implant date: 10/16/2011  RV Lead: [place] Model 4470 [place] II Sterox EZ  Implant date: 10/16/2011  Underlying rhythm: afib    Settings:  Mode DDDR  Lower rate 75 bpm  Max tracking rate 130 bpm  Brady max sensor rate 130 bpm  AV delay 350/350 ms  RA sensing sensitivity 0.2 mV  RV sensing sensitivity 0.6 mV  RA pacing polarity: Bipolar  RV pacing polarity: Bipolar  RA pacing: Amplitude 2.0 V @ Pulse Width 0.4 ms  RV pacing: Amplitude 1.2 V @ Pulse Width 0.4 ms    RA Test:  RA impedance 587 ohms  RA pacing threshold: Amplitude 0.5 V @ Pulse Width 0.4 ms    RV Test:  RV impedance 569 ohms  RV pacing threshold: Amplitude 0.6 V @ Pulse Width 0.4 ms    Battery:  Battery status BOS  Battery remaining longevity 36 months    Percent Paced:  RA percent paced 0 %  RV percent paced 99 %

## 2015-11-15 LAB — ELECTROCARDIOGRAM WITH RHYTHM STRIP: QTC: 472

## 2015-11-16 ENCOUNTER — Ambulatory Visit (HOSPITAL_BASED_OUTPATIENT_CLINIC_OR_DEPARTMENT_OTHER): Payer: Medicare Other | Admitting: Cardiovascular Disease

## 2015-11-16 ENCOUNTER — Ambulatory Visit (HOSPITAL_BASED_OUTPATIENT_CLINIC_OR_DEPARTMENT_OTHER): Payer: Medicare Other

## 2015-11-16 ENCOUNTER — Encounter: Payer: Self-pay | Admitting: Cardiovascular Disease

## 2015-11-16 ENCOUNTER — Ambulatory Visit
Admission: RE | Admit: 2015-11-16 | Discharge: 2015-11-16 | Disposition: A | Discharge status: Home / Self Care | Payer: Medicare Other | Source: Ambulatory Visit | Attending: Cardiovascular Disease | Admitting: Cardiovascular Disease

## 2015-11-16 VITALS — BP 138/82 | HR 89 | Ht 64.5 in | Wt 150.0 lb

## 2015-11-16 DIAGNOSIS — Z7901 Long term (current) use of anticoagulants: Secondary | ICD-10-CM

## 2015-11-16 DIAGNOSIS — Z45018 Encounter for adjustment and management of other part of cardiac pacemaker: Secondary | ICD-10-CM

## 2015-11-16 DIAGNOSIS — I481 Persistent atrial fibrillation: Principal | ICD-10-CM | POA: Insufficient documentation

## 2015-11-16 DIAGNOSIS — I447 Left bundle-branch block, unspecified: Secondary | ICD-10-CM

## 2015-11-16 DIAGNOSIS — I429 Cardiomyopathy, unspecified: Secondary | ICD-10-CM

## 2015-11-16 DIAGNOSIS — I4819 Other persistent atrial fibrillation: Secondary | ICD-10-CM

## 2015-11-16 DIAGNOSIS — R9431 Abnormal electrocardiogram [ECG] [EKG]: Secondary | ICD-10-CM

## 2015-11-16 DIAGNOSIS — Z950 Presence of cardiac pacemaker: Secondary | ICD-10-CM

## 2015-11-16 DIAGNOSIS — I340 Nonrheumatic mitral (valve) insufficiency: Principal | ICD-10-CM

## 2015-11-16 DIAGNOSIS — R0600 Dyspnea, unspecified: Secondary | ICD-10-CM

## 2015-11-16 MED ORDER — METOPROLOL TARTRATE 25 MG TABLET
12.5000 mg | ORAL_TABLET | Freq: Two times a day (BID) | ORAL | 12 refills | Status: DC
Start: 2015-11-16 — End: 2015-11-21

## 2015-11-16 NOTE — Progress Notes (Signed)
Brittany Stokes  MR #: [id number]    DOB: [birth year]    SEX: Female   AGE: 82yr    CONSULTATION DATE:11/16/2015    CARDIOLOGY CONSULTATION:    REASON FOR CONSULT: afib      REQUESTING PHYSICIAN: Dr. Candiss Norse    CHIEF COMPLAINT:   palpitations    HISTORY OF PRESENT ILLNESS:    82 yr old female with dyspnea on exertion, class II, relieved by rest; no radiating pain. EKg noted to have afib persistent, and echo revealed severe MR. She is being evaluated for MV repair vs mitraclip.She has been on pradaxa and sotalol.     No radiating chest pain,  no orthopnea or PND,  no sustained palpitations, no syncope.No leg swelling.No TIA/Stroke; no fatigue.      No cough, no fever, no TIA/ stroke symptoms, no constipation, no nausea/ vomiting, no diarrhoea, no open wounds, no urinary problems . No bleeding.    A complete review of systems was performed and negative except as per HPI.        PAST MEDICAL HISTORY:    Past Medical History   Diagnosis Date    Arthritis     Balance problem     Cancer      skin cancer    Hypertension     Shellfish allergy 11/22/2015   Severe MR  Persistent afib  PPM for SSS  Polymyalgia Rheumatica and inclusion body myositis. Chronically elevated CPK. On steroids  HTN  Hypothyroidism  PVC    No past surgical history on file.  Patient Active Problem List   Diagnosis    Mitral regurgitation    Shellfish allergy         ALLERGIES:      Ciprofloxacin    Unknown-Explain in Comments  Epinephrine    Tachycardia  Erythromycin    N/A  Shellfish Containing Products    Angioedema  Tetracycline    Unknown-Explain in Comments      MEDICATIONS:      Current Outpatient Prescriptions:     Alprazolam (XANAX) 0.25 mg Tablet Tablet, Take by mouth., Disp: , Rfl:     DIGOX 250 mcg Tablet,   , Disp: , Rfl:     FamoTIDine (PEPCID) 20 mg Tablet Tablet, Take 1 tablet by mouth every morning. Take with Prednisone and Benadryl, Disp: 2 tablet, Rfl: 0    Metoprolol Succinate (TOPROL XL) 50 mg SR Tablet, Take 1  tablet by mouth every day. Take 1 tablet by mouth mid morning., Disp: 30 tablet, Rfl: 12    PRADAXA 150 mg Capsule,   , Disp: , Rfl:     PredniSONE (DELTASONE) 1 mg Tablet, Take 3 mg by mouth., Disp: , Rfl:     PREMARIN 0.625 mg Tablet,   , Disp: , Rfl:     SYNTHROID 125 mcg Tablet,   , Disp: , Rfl:         SOCIAL HISTORY:    Social History   Substance Use Topics    Smoking status: Former Smoker    Smokeless tobacco: None    Alcohol use No         FAMILY HISTORY:    No family history on file.      PHYSICAL EXAMINATION:    BP 138/82  Pulse 89  Ht 1.638 m (5' 4.5")  Wt 68 kg (150 lb)  SpO2 96%  BMI 25.35 kg/m2  HEENT normal, PERLA, EOMI  Sclera anicteric  External ears normal  Oral mucosa- no  erythema  Neck supple  A&O x4  Well groomed  No thyromegaly  No lymphadenopathy  chest: CTA  cor: irr, no m/r/g  JVP normal, no car bruit  no clubbing/cyanosis/edema  pulses  bil equal  abd: soft, bs+, no Hepatosplenomegaly  Skeletal: no deformities  Skin: normal  neuro: no gross deficit        STUDIES:    Ekg: afib; intermittent VP complexes        LABORATORY:    Lab Results   Component Value Date    TSH 4.01 06/22/2002   ]    Lab Results   Lab Name Value Date/Time    NA 139 11/09/2015 11:30 AM    K 4.0 11/09/2015 11:30 AM    CL 106 11/09/2015 11:30 AM    CO2 24 11/09/2015 11:30 AM    BUN 11 11/09/2015 11:30 AM    CR 0.45 11/09/2015 11:30 AM    GLU 86 11/09/2015 11:30 AM       Lab Results   Lab Name Value Date/Time    WBC 6.7 06/22/2002 10:03 AM    HGB 13.0 06/22/2002 10:03 AM    HCT 37.9 06/22/2002 10:03 AM    PLT 331 06/22/2002 10:03 AM       No results found for: HGBA1C    No results found for: CHOL, LDLC, HDL, TRIG    No results found for: AST, ALT, ALP, ALB, TP, TBIL      Echo: LVEF 50%, severe MR        Holter:NA    I have personally reviewed, EKG, Holter/ event monitor, labs,and imaging studies.    PPM;     ASSESSMENT AND PLAN:  82 yr old female     1. Dyspnea: PFT ordered; TEE reveals severe MR; likely  controbuting. [place] planned.    2. MR ? ACEI, also for BP control; primary vs secondary to persistent Afib and LAE. LV dimensions normal. Mitraclip likely to be done by Dr. Candiss Norse    3. Afib- persistent since Sep 2016, secondary to MR.   Ablation discussed- deferred, until resolution of MR.   Rate control + vs Rhythm control addressed. She prefers to be rate controlled.    stop sotalol.start metoprolol    Continue pradaxa for now.     4.PPM- dual chamber- reprogram 60/min to reduce VP.   5. Cardiomyopathy: mild.. Unclear if related to MR or secondary to fast rates. Best to obtain Holter for HR monitor.      The plan was reviewed with the patient. Patient was informed of the thought process behind the clinical decision making that occurred during this visit. By the end of the visit, all questions were answered, and the patient was in agreement with the plan.     Thank you for allowing me to participate in the care of your patient. Please call me for any questions.    Fu arrcar after mitraclip.    Electronically signed by  Hermina Barters MD, MAS  Attending Physician  Division of Cardiovascular Medicine

## 2015-11-16 NOTE — Nursing Note (Signed)
BP repeated. Olympia Adelsberger, LVN

## 2015-11-16 NOTE — Progress Notes (Signed)
Brittany Stokes was seen in Lorretta Harp MD clinic today and had requested a device check today.  Patient has Careers adviser.    Device function is normal, Lead function is normal  Battery status is MOL II with 3 years longevity.  Underlying rhythm: Afib at 68-72 bpm    Arrhythmias/Comments:   1 AMS episode with atrial burden of 100 bpm. EGMs showed atrial fibrillation/ atrial flutter.   No VT/VF episodes     Per Lorretta Harp MD change LLR from 80 to 60 bpm.    Lorretta Harp MD was notified of results.  Next scheduled office visit is 1 year and next remote follow up is 3 months  Interrogated by: Jack Quarto CCT, CRAT, HLT II    Lorretta Harp MD (HLT II supervising)  Department of Internal Medicine, Division of Cardiology

## 2015-11-16 NOTE — Progress Notes (Signed)
Date of Service: 11/16/2015    Following MD: Dr. [name]  Implanting MD: Dr. Aileen Fass  Referring MD: Dr. Zenaida Deed    Diagnosis: I49.5 Sick Sinus Syndrome / Brady-Tachy    Device:  [place] [id number] ADVANTIO  Implant date: 10/16/2011  RA Lead: [place] Model 4469 [place] II Sterox EZ  Implant date: 10/16/2011  RV Lead: [place] Model 4470 [place] II Sterox EZ  Implant date: 10/16/2011  Underlying rhythm: 68-72 bpm AFIB/AFLU    Settings:  Mode DDDR  Lower rate 60 bpm  Max tracking rate 130 bpm  Brady max sensor rate 130 bpm  AV delay 350.0/350.0 ms  RA sensing sensitivity 0.2 mV  RV sensing sensitivity 0.6 mV  RA pacing polarity: Bipolar  RV pacing polarity: Bipolar  RA pacing: Amplitude 2.0 V @ Pulse Width 0.4 ms  RV pacing: Amplitude 1.1 V @ Pulse Width 0.4 ms    RA Test:  RA impedance 578.0 ohms  RA sensing 1.9 mV  RA pacing threshold: Amplitude 0.5000 V @ Pulse Width 0.4 ms    RV Test:  RV impedance 550.0 ohms  RV sensing 19.0 mV  RV pacing threshold: Amplitude 0.6000 V @ Pulse Width 0.4 ms    Battery:  Battery status BOS  Battery remaining longevity 6-10 years     Percent Paced:  RA percent paced 0 %  RV percent paced 99 %

## 2015-11-16 NOTE — Nursing Note (Signed)
Patient ID verified x2. Vitals taken. Screened for pain. Allergy list reviewed. Pharmacy confirmed. EKG done per MD's order. Troi Bechtold, LVN

## 2015-11-17 ENCOUNTER — Encounter: Payer: Self-pay | Admitting: Cardiovascular Disease

## 2015-11-17 ENCOUNTER — Telehealth: Payer: Self-pay | Admitting: Cardiovascular Disease

## 2015-11-17 DIAGNOSIS — Z91013 Allergy to seafood: Principal | ICD-10-CM

## 2015-11-17 NOTE — Telephone Encounter (Signed)
Patient states her pacemaker setting was recently changed from 35 to 17 which she would discuss with a pacemaker technician.  She can be reached at [phone].    Raeford Razor; [facility]  Phone: [phone]  Fax  [phone]

## 2015-11-17 NOTE — Progress Notes (Signed)
Date of Service: 11/17/2015    Following MD: Dr. [name]  Implanting MD: Dr. Aileen Fass  Referring MD: Dr. Zenaida Deed    Diagnosis: I49.5 Sick Sinus Syndrome / Brady-Tachy    Device:  [place] [id number] ADVANTIO  Implant date: 10/16/2011  RA Lead: [place] Model 4469 [place] II Sterox EZ  Implant date: 10/16/2011  RV Lead: [place] Model 4470 [place] II Sterox EZ  Implant date: 10/16/2011    Settings:  Mode DDDR  Lower rate 60 bpm  Max tracking rate 130 bpm  Brady max sensor rate 130 bpm  AV delay 350/350 ms  RA sensing sensitivity 0.2 mV  RV sensing sensitivity 0.6 mV  RA pacing polarity: Bipolar  RV pacing polarity: Bipolar  RA pacing: Amplitude 2.0 V @ Pulse Width 0.4 ms  RV pacing: Amplitude 1.1 V @ Pulse Width 0.4 ms    RA Test:  RA impedance 580 ohms  RA sensing 1.4 mV    RV Test:  RV impedance 566 ohms  RV pacing threshold: Amplitude 0.6 V @ Pulse Width 0.4 ms    Battery:  Battery status BOS  Battery remaining longevity 36 months    Percent Paced:  RA percent paced 0 %  RV percent paced 69 %

## 2015-11-17 NOTE — Progress Notes (Addendum)
Latitude Remote, reviewed on 11/17/2015.  A remote transmission was sent on [employer]  Patient has Careers adviser.   This was an unscheduled remote transmission.     Device function is normal, Lead function is normal  Battery status is MOL I, with an estimated longevity of 3 years.   Presenting EGM:  AS/VS-VP at 60-75 bpm- atrial fibrillation     Arrhythmias/Comments:   1 AMS episode with atrial burden of 100%, EGMs showed atrial fibrillation   No VT/VF episodes     Patient had called regarding changes, see telephone encounter    Next scheduled remote is 3 months and office visit is one year.   Interrogated by: Jack Quarto CCT, CRAT, HLT II    Lorretta Harp MD (HLT II supervising)  Department of Internal Medicine, Division of Cardiology    Appears to be persistant PAF/PAFlutter.  On pradaxa, toprol xl and digoxin.  I have personally reviewed the report and download and agree with the findings.  [name], P.A  Department of Internal Medicine,  Division of Cardiology.

## 2015-11-17 NOTE — Telephone Encounter (Signed)
Called patient. She stated that she does not like her heart rate jumping around. She can feel it. She stated that it makes her feel jittery. She would like to have the rate back up, but agreed to try it for one week. She Stated she had her heart rate like this before and she did like it before and she she felt more normal when it was higher.   I told her that she had went from 99% RV paced to 56% RV paced, which is good.     She would like an appt with Dr Candiss Norse to talk about her heart and the echo results. She also wants to know if she is safe to travel this summer, she will be gone for 2 months.   Please advise  Rica Heather CCT, CRAT, HLT II

## 2015-11-17 NOTE — Telephone Encounter (Signed)
Let me talk to her when i get back (this weekend).      Last i left it with her.  She needs a TEE and Cath.  TEE completed.  Cath pending.      Let me discuss via phone before scheduling with me at least.      Bartley Vuolo

## 2015-11-17 NOTE — Telephone Encounter (Signed)
Forwarding message to Dr. Candiss Norse and to Dr. Delane Ginger.    Patient is requesting a f/up appointment with Dr. Candiss Norse. Marcell Barlow, RN

## 2015-11-17 NOTE — Telephone Encounter (Signed)
Called and spoke with patient.  Identity verified.  She described her heart as doing a "hop, skip and jump" today, but for the last couple of hours, things are "calming down".  She denies chest pain or shortness of breath, and doesn't feel the heartbeat as much when she is standing up.  She said she decided to bake some cookies to distract her.  She does not sound to be in any distress at this time.  Heart rate ranges from 59 to 73.  Says she was switched from sotalol to metoprolol.    She would like to have an appointment with Dr. Candiss Norse soon, as she plans on visiting her family/grandchildren in June, and wants to know if she'll be able to help take care of them, or if she can travel at all.    I told her I would send this message to Dr. Candiss Norse, as well as Dr. Delane Ginger, and we would call her back with an appointment date and time.  Daiva Eves, RN, Cardiology & Nephrology

## 2015-11-18 ENCOUNTER — Encounter: Payer: Self-pay | Admitting: Cardiovascular Disease

## 2015-11-18 ENCOUNTER — Telehealth: Payer: Self-pay | Admitting: Cardiovascular Disease

## 2015-11-18 NOTE — Progress Notes (Addendum)
Latitude Remote, reviewed on 11/17/2015.  A remote transmission was sent on [facility].  Patient has Careers adviser.   This was an unscheduled remote transmission.     Device function is normal, Lead function is normal  Battery status is MOL I, with an estimated longevity of 3 years.   Presenting EGM:  AS/VS-VP at 60-75 bpm- atrial fibrillation     Arrhythmias/Comments:   1 AMS episode with atrial burden of 100%, EGMs showed atrial fibrillation. Patient is on Dabigatran (PRADAXA)   No VT/VF episodes     Next scheduled remote is 3 months and office visit is one year.   Interrogated by: Jack Quarto CCT, CRAT, HLT II    Lorretta Harp MD (HLT II supervising)  Department of Internal Medicine, Division of Cardiology    LRL recently decreased to allow intrinsic rhythm.  I have personally reviewed the report and download and agree with the findings.  [name], P.A  Department of Internal Medicine,  Division of Cardiology.

## 2015-11-18 NOTE — Progress Notes (Signed)
Date of Service: 11/18/2015    Following MD: Dr. [name]  Implanting MD: Dr. Aileen Fass  Referring MD: Dr. Zenaida Deed    Diagnosis: I49.5 Sick Sinus Syndrome / Brady-Tachy    Device:  [place] [id number] ADVANTIO  Implant date: 10/16/2011  RA Lead: [place] Model 4469 [place] II Sterox EZ  Implant date: 10/16/2011  RV Lead: [place] Model 4470 [place] II Sterox EZ  Implant date: 10/16/2011    Settings:  Mode DDDR  Lower rate 60 bpm  Max tracking rate 130 bpm  Brady max sensor rate 130 bpm  AV delay 350/350 ms  RA sensing sensitivity 0.2 mV  RV sensing sensitivity 0.6 mV  RA pacing polarity: Bipolar  RV pacing polarity: Bipolar  RA pacing: Amplitude 2.0 V @ Pulse Width 0.4 ms  RV pacing: Amplitude 1.1 V @ Pulse Width 0.4 ms    RA Test:  RA impedance 580 ohms  RA sensing 1.4 mV    RV Test:  RV impedance 566 ohms  RV pacing threshold: Amplitude 0.6 V @ Pulse Width 0.4 ms    Battery:  Battery status BOS  Battery remaining longevity 36 months  Battery at 57%     Percent Paced:  RA percent paced 0 %  RV percent paced 56 %

## 2015-11-18 NOTE — Telephone Encounter (Signed)
Patient calling with c/o elevated BP since yesterday. Feels agitated and nervous. Feels like her heart is beating harder than usual. Irregularity is more noticeable.  Patient was tearful during our call.   She was seen on 4/5 and sotalol was discontinued. She was started on Metoprolol 12.'5mg'$  BID.  Last BP about 2 hours ago 173/111.  She is currently at an infusion center with her husband who is receiving a blood transfusion for multiple myeloma.   She has a pulse oximeter with her and she reports HR ranges from 79-100. O2 sat 97%.    Advised patient I would consult Dr. Delane Ginger and call her back with plan.    Discussed with Dr. Delane Ginger.  Orders to increase Metoprolol tartrate to '25mg'$  BID.  BP check, EKG on Monday while MD in clinic.  Reassure patient.     Spoke to patient again, gave instructions per MD.   Patient repeats back instructions and verbalized understanding.  Patient reassured.  Scheduled for nurse visit on 4/10 at [place], RN

## 2015-11-18 NOTE — Progress Notes (Signed)
Latitude Remote, reviewed on 11/18/2015.  A remote transmission was sent on [facility]. Patient has Careers adviser.   This was an unscheduled remote transmission.     Device function is normal, Lead function is normal  Battery status is MOL I, with an estimated longevity of 3 years.   Presenting EGM: AS/VS-VP at 60-75 bpm- atrial fibrillation     Arrhythmias/Comments:   1 AMS episode with atrial burden of 100%, EGMs showed atrial fibrillation. Patient is on Dabigatran (PRADAXA)     No VT/VF episodes     Patient had called regarding changes, see telephone encounter    Next scheduled remote is 3 months and office visit is one year.   Interrogated by: Jack Quarto CCT, CRAT, HLT II    Lorretta Harp MD (HLT II supervising)  Department of Internal Medicine, Division of Cardiology

## 2015-11-18 NOTE — Telephone Encounter (Signed)
Patient is calling because BP is going high 173/111.  She states that Dr. Delane Ginger changed her medications.  Informed nurse, she will contact patient.

## 2015-11-19 NOTE — Progress Notes (Signed)
I have personally reviewed the report and agree with the findings.

## 2015-11-20 NOTE — Telephone Encounter (Signed)
Noted that Dr. Candiss Norse is following re echo. I have reprogramemd device to allow intrinsic RV conduction, to improve LV function.    Electronically signed by  Hermina Barters MD, MAS  Attending Physician  Division of Cardiovascular Medicine

## 2015-11-21 ENCOUNTER — Encounter: Payer: Self-pay | Admitting: Adult Health

## 2015-11-21 ENCOUNTER — Ambulatory Visit: Payer: Medicare Other | Attending: Adult Health | Admitting: Adult Health

## 2015-11-21 VITALS — BP 149/73 | HR 60 | Ht 64.5 in | Wt 151.9 lb

## 2015-11-21 VITALS — BP 149/73 | HR 60 | Wt 151.9 lb

## 2015-11-21 DIAGNOSIS — I4891 Unspecified atrial fibrillation: Secondary | ICD-10-CM | POA: Insufficient documentation

## 2015-11-21 DIAGNOSIS — I10 Essential (primary) hypertension: Principal | ICD-10-CM | POA: Insufficient documentation

## 2015-11-21 DIAGNOSIS — R9431 Abnormal electrocardiogram [ECG] [EKG]: Secondary | ICD-10-CM | POA: Insufficient documentation

## 2015-11-21 DIAGNOSIS — I482 Chronic atrial fibrillation, unspecified: Secondary | ICD-10-CM

## 2015-11-21 MED ORDER — METOPROLOL SUCCINATE ER 50 MG TABLET,EXTENDED RELEASE 24 HR
50.0000 mg | EXTENDED_RELEASE_TABLET | Freq: Every day | ORAL | 12 refills | Status: DC
Start: 2015-11-21 — End: 2016-11-30

## 2015-11-21 NOTE — Nursing Note (Signed)
Vital signs taken, screened for pain, allergies and pharmacy verified. Latasha Amos, MAI

## 2015-11-21 NOTE — Telephone Encounter (Signed)
Just spoke to patient.  Discussed findings of TEE.    She has moderate-severe, severe MR.  She has mild anterior leaflet prolapse with medial A2/P2 severe centrally directed MR.  She has bouts of HTN with SBP rising to 190s leading to dyspnea and also anxiety and also worsening of her afib with RVR.      We discussed role of MitraClip here which would be to decrease her MR and to decrease above symptoms.    She is agreeable to proceed.  She needs cor angio + catheterization.  Order has already been placed.    Janine, can we schedule cor angio for next week? L radial, R IJ.  She is on pradaxa, will need to hold.  No bridge.  I think Tuesday is good for me next week    Brittany Stokes

## 2015-11-21 NOTE — Patient Instructions (Signed)
Your prescription will be changed to toprol XL (extended release).  You will now take  toprol XL 50mg  once a day.  Take the medication mid morning for best results.   Continue to take your blood pressure daily.   Follow up with Dr. Candiss Norse on Friday.

## 2015-11-22 ENCOUNTER — Encounter: Payer: Self-pay | Admitting: Cardiovascular Disease

## 2015-11-22 DIAGNOSIS — Z91013 Allergy to seafood: Secondary | ICD-10-CM | POA: Insufficient documentation

## 2015-11-22 HISTORY — DX: Allergy to seafood: Z91.013

## 2015-11-22 MED ORDER — PREDNISONE 20 MG TABLET
60.0000 mg | ORAL_TABLET | Freq: Every day | ORAL | 0 refills | Status: AC
Start: 2015-11-27 — End: 2015-11-29

## 2015-11-22 MED ORDER — FAMOTIDINE 20 MG TABLET
20.0000 mg | ORAL_TABLET | Freq: Every day | ORAL | 0 refills | Status: DC
Start: 2015-11-27 — End: 2016-07-18

## 2015-11-22 MED ORDER — DIPHENHYDRAMINE 25 MG CAPSULE
25.0000 mg | ORAL_CAPSULE | Freq: Every day | ORAL | 0 refills | Status: AC
Start: 2015-11-27 — End: 2015-11-29

## 2015-11-22 NOTE — Telephone Encounter (Signed)
Left message for Janine regarding scheduling.    Melvyn Neth RN, BSN

## 2015-11-22 NOTE — Progress Notes (Signed)
I have personally reviewed the report and agree with the findings and recommendations.      Electronically signed by  Amzie Sillas N Emilee Market MD, MAS  Attending Physician  Division of Cardiovascular Medicine

## 2015-11-22 NOTE — Progress Notes (Signed)
S:Brittany Stokes is a 67yr female with paroxymsal A Fib, SSS s/p PPM, IBM, PMR, HTN. She was seen on 11/16/15 by Dr. Delane Ginger and sotalol was discontinued. She was started on Metoprolol 12.5mg  BID at that time. Pt called clinic on 11/18/15 with c/o elevated BP.  Pt reported feeling agitated and nervous "like her heart is beating harder than usual".  Her BP prior to her phone call was 173/111.  She had a pulse oximeter with her and she reported HR ranges from 79-100. O2 sat 97%. She was instructed to increase metoprolol to 25mg  BID and asked to come to clinic to be evaluated.  Today she presents to clinic for follow up.    Pt reports she feels fine now, but for the last couple of days, her blood pressure has increased to 190/136 around 5pm.  As a result of the high BP, she has palpitations and anxiety.  Pt reports improvement of symptoms when her husband rubs her arm and shoulders.  Pt reports she has been taking 25mg  metoprolol in the evening, but only taking 12.5 in the morning because she is afraid to take an increased dose due to her SBP running 100-110 in the am.  Pt is supposed to be scheduled for cardiac catheterization to evaluate for mitral clip, but pt states she has questions regarding the procedure and would like to discuss it further with Dr. Maryelizabeth Kaufmann:    Blood pressure 149/73, pulse 60, height 1.638 m (5' 4.5"), weight 68.9 kg (151 lb 14.4 oz), SpO2 99 %.    GENERAL: NAD  HEART: rate regular. Irregular rhythm. No murmurs/rubs/gallops  LUNGS: Clear bilaterally to auscultation, no rales/wheezes/rhonchi.  ABDOMEN: soft and nontender. BS (+) x 4quad  EXT: no edema.  Extremities warm with good cap refill.  2+ radial pulses B/L    EKG completed 11/21/15   Atrial fibrillation with frequent ventricular paced complexes  Vent rate 67bpm      A: hypertension likely exacerbated by anxiety    P: D/w Dr. Delane Ginger who recommends the following:  -change beta blocker to Toprol XL 50mg  tablet - take 1 tablet once a day in  mid morning  -continue to monitor BP daily.    -follow up with Dr. Candiss Norse on Friday 11/25/15 RE: mitral clip procedure    Deondrea Markos M. Donnal Debar MSN, ANP-BC  Nurse Practitioner  [facility]  [phone]

## 2015-11-22 NOTE — Addendum Note (Signed)
Addended by: Danny Lawless on: 11/22/2015 11:49 AM     Modules accepted: Orders

## 2015-11-22 NOTE — Telephone Encounter (Addendum)
A date and time of 11/29/15 with 0900 arrival for cardiac catheterization with Dr. Candiss Norse, referral from Dr. Donavan Burnet,  were left with my call back number for confirmation. Pre-procedure instructions, information and directions will be mailed to her home. Pre-medications for her Shellfish allergy were ordered at the [facility] in her chart. JNeely, MSN, Cardiovascular CNS    [facility]   INVASIVE CARDIOVASCULAR PRE-PROCEDURE INSTRUCTIONS    Brittany Stokes  YOU HAVE BEEN SCHEDULED ON 11/29/2015 TO ARRIVE AT 9:00 AM  FOR YOUR CORONARY ANGIOGRAM  WITH DR. [facility]        Please check in at [facility], [phone]   [address] [place]  [address]    Wheelchairs are available at the [employer] and in [place] [address]    You will receive ONE parking voucher for the day of the procedure      YPRE-PROCEDURE INSTRUCTIONSY                 STOP taking Pradaxa (dabigatran) on 11/27/2015  TAKE PREDNISONE 60 MG, PEPCID 20 MG AND BENADRYL 25 MG      ONCE A DAY, FOR TWO DAYS, STARTING ON 11/27/2015     They have been ordered at the [facility] on [address] MIDNIGHT the night before your procedure.     You may take THE REST of your morning medications with a sip of water.                         IF YOU HAVE ANY QUESTIONS CALL THE NURSE COORDINATOR AT [phone]     Lysle Dingwall OF [facility]    Unexpected patient emergencies may delay the start of your procedure.    We will keep you informed if this occurs.      You need to be driven home.  Your ride should be ready to pick you up when you are discharged.      [place] FOR YOUR CARE

## 2015-11-24 ENCOUNTER — Telehealth: Payer: Self-pay

## 2015-11-24 NOTE — Telephone Encounter (Signed)
Brittany Stokes was not home when I called.  Requested she call me bakc and gave my number to her husband.  He asked what the call was about and I explained we needed to review her medications before her procedure nest Tuesday.  Brittany Stokes said they knew nothing about a procedure next Tuesday.  I explained that I LM on their phone on April 6.  I asked her to call me so I can speak to her about the procedure and medications. JNeely, MSN, Cardiovascular CNS

## 2015-11-25 ENCOUNTER — Ambulatory Visit: Payer: Medicare Other | Attending: Cardiovascular Disease | Admitting: Cardiovascular Disease

## 2015-11-25 ENCOUNTER — Encounter: Payer: Self-pay | Admitting: Cardiovascular Disease

## 2015-11-25 VITALS — BP 152/75 | HR 72 | Resp 18 | Ht 64.5 in | Wt 151.6 lb

## 2015-11-25 DIAGNOSIS — Z950 Presence of cardiac pacemaker: Secondary | ICD-10-CM | POA: Insufficient documentation

## 2015-11-25 DIAGNOSIS — Z7901 Long term (current) use of anticoagulants: Secondary | ICD-10-CM | POA: Insufficient documentation

## 2015-11-25 DIAGNOSIS — I10 Essential (primary) hypertension: Secondary | ICD-10-CM | POA: Insufficient documentation

## 2015-11-25 DIAGNOSIS — I340 Nonrheumatic mitral (valve) insufficiency: Principal | ICD-10-CM | POA: Insufficient documentation

## 2015-11-25 DIAGNOSIS — R9431 Abnormal electrocardiogram [ECG] [EKG]: Secondary | ICD-10-CM | POA: Insufficient documentation

## 2015-11-25 DIAGNOSIS — I495 Sick sinus syndrome: Secondary | ICD-10-CM | POA: Insufficient documentation

## 2015-11-25 DIAGNOSIS — I481 Persistent atrial fibrillation: Secondary | ICD-10-CM | POA: Insufficient documentation

## 2015-11-25 NOTE — Nursing Note (Signed)
Vitals were taken, screened for pain, pharmacy and allergies verified. EKG was also done.      Sesar Madewell MA
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
## 2015-11-28 NOTE — Progress Notes (Signed)
Dr. Candiss Norse has notified me that Brittany Stokes's cardiac catheterization for tomorrow, 11/29/15, has been cancelled.  She is not pursuing Mitral Clipping as an option at this time. JNeely, MSN, Cardiovascular CNS

## 2015-11-30 LAB — POC ELECTROCARDIOGRAM WITH RHYTHM STRIP: QTC: 405

## 2015-12-01 ENCOUNTER — Encounter: Payer: Self-pay | Admitting: Cardiovascular Disease

## 2015-12-02 NOTE — Progress Notes (Signed)
Date of Service: 12/01/2015    Following MD: Dr. [name]  Implanting MD: Dr. Aileen Fass  Referring MD: Dr. Zenaida Deed    Diagnosis: I49.5 Sick Sinus Syndrome / Brady-Tachy    Device:  [place] [phone] ADVANTIO  Implant date: 10/16/2011  RA Lead: [place] Model 4469 [place] II Sterox EZ  Implant date: 10/16/2011  RV Lead: [place] Model 4470 [place] II Sterox EZ  Implant date: 10/16/2011    Settings:  Mode DDDR  Lower rate 60 bpm  Max tracking rate 130 bpm  Brady max sensor rate 130 bpm  AV delay 350/350 ms  RA sensing sensitivity 0.2 mV  RV sensing sensitivity 0.6 mV  RA pacing polarity: Bipolar  RV pacing polarity: Bipolar  RA pacing: Amplitude 2.0 V @ Pulse Width 0.4 ms  RV pacing: Amplitude 1.1 V @ Pulse Width 0.4 ms    RA Test:  RA impedance 570 ohms  RA sensing 2.0 mV    RV Test:  RV impedance 497 ohms  RV sensing 19.8 mV  RV pacing threshold: Amplitude 0.5 V @ Pulse Width 0.4 ms    Battery:  Battery status BOS  Battery remaining longevity 36 months  Battery at 57%     Percent Paced:  RA percent paced 0 %  RV percent paced 58 %

## 2015-12-02 NOTE — Progress Notes (Addendum)
Latitude Remote, reviewed on 12/01/2015.  A remote transmission was sent on [facility].  Patient has Careers adviser.   This was an unscheduled  remote transmission.     Device function is normal, Lead function is normal  Battery status is MOL I, with an estimated longevity of 3 years.   Presenting EGM:  AS/VS-P at 70-86 bpm    Arrhythmias/Comments:   1 AMS episode which the atrial burden is 100%. EGM showed atrial fibrillation. Patient is on Apixaban [place]ne [place])    1-NSVT episode which the EGMs showed atrial fibrillation with rapid ventricular response at 190-200 bpm for 1.5 seconds    Next scheduled remote is May and office visit is April 2018.   Interrogated by: Jack Quarto CCT, CRAT, HLT II    [name] MD (HLT II supervising)  Department of Internal Medicine, Division of Cardiology    100% atrial burden.  Event in the NS VT bin is a short run of RVR.  I have personally reviewed the report and download and agree with the findings.  [name], P.A  Department of Internal Medicine,  Division of Cardiology.

## 2015-12-03 NOTE — Progress Notes (Signed)
I have personally reviewed the report and agree with the findings and recommendations.      Electronically signed by  Shaaron Golliday N Lucy Woolever MD, MAS  Attending Physician  Division of Cardiovascular Medicine

## 2015-12-05 ENCOUNTER — Telehealth: Payer: Self-pay | Admitting: Cardiovascular Disease

## 2015-12-05 NOTE — Telephone Encounter (Signed)
Tell her to stop captopril.    She should start amlodipine 2.5mg  po daily.    If in 1 week after starting her SBPs are > 140, she should increase to 5mg  po daily.    No blood tests needed.      Brittany Stokes

## 2015-12-05 NOTE — Telephone Encounter (Signed)
Returned call @ # provided and left message to call clinic. Will triage further upon callback.  Beryl Balz, Float RN

## 2015-12-05 NOTE — Telephone Encounter (Signed)
Patient is calling stating she can not control her b/p states it has been running high to the point where she was in the ER.  States her reading was 195/110.    States she was given medication at the Er to bring it down,.    States she does not know what to do but needs help from Dr. Candiss Norse to help adjust her medication,.      Thank You,  Brittany Stokes

## 2015-12-05 NOTE — Telephone Encounter (Signed)
Patient returned clinic/physicians call. Verified name, DOB, MRN and/or address at initiation of call. Stated she went to [facility] ER last night due to elevated BP since Friday as systolic > 123XX123 diastolic > 123XX123. Was evaluated. Rx for captopril 25 mg HS was given. About 30 minutes later her BP went down to 123456 systolic. Called in to find out if providers agree to the new Rx she was given. Pt bought a BP machine with arm cuff. Stated it was calibrated in ER last night. Advised to monitor and log readings as suggested. Knows to call clinic with elevated readings, symptoms.  In agreement with plan.        Morene Antu, Float RN

## 2015-12-06 ENCOUNTER — Telehealth: Payer: Self-pay | Admitting: Internal Medicine

## 2015-12-06 MED ORDER — LISINOPRIL 5 MG TABLET
5.0000 mg | ORAL_TABLET | Freq: Every day | ORAL | 11 refills | Status: DC
Start: 2015-12-06 — End: 2016-07-18

## 2015-12-06 NOTE — Telephone Encounter (Signed)
She calls in because she is having really high blood pressure 195/110 and she went to the ED 2 nights ago and was given Captopril QHS. She decided not to take the Captopril last night because she wasn't sure that it wouldn't affect her heart rate. She calls in because her blood pressure is close to A999333 systolic again today. She is very anxious and afraid of having damage to her mitraclip from the HTN. She denied any shortness of breath, swelling, or orthopnea. I reassured her that we would get her blood pressure under control. I sent a prescription to [facility] for Lisinopril 5mg  PO daily. I told her that if her blood pressure got over [id number] systolic we recommended that she go back to the ED. She felt more at ease and thanked me.

## 2015-12-06 NOTE — Telephone Encounter (Signed)
Patient husband calling wanting to follow-up on wife's BP    Please call patient back.    Thank Doy Mince  [facility]  [phone]

## 2015-12-06 NOTE — Telephone Encounter (Signed)
Left message for patient to return call.  Per EMR notes, it appears patient spoke to MD on call sometime this morning.     Lindie Spruce, RN

## 2015-12-09 NOTE — Telephone Encounter (Signed)
Agree with recommendation made.    Electronically signed by  Hermina Barters MD, MAS  Attending Physician  Division of Cardiovascular Medicine

## 2015-12-12 ENCOUNTER — Ambulatory Visit
Admission: RE | Admit: 2015-12-12 | Discharge: 2015-12-12 | Disposition: A | Discharge status: Home / Self Care | Payer: Medicare Other | Source: Ambulatory Visit | Attending: Cardiovascular Disease | Admitting: Cardiovascular Disease

## 2015-12-12 DIAGNOSIS — I4949 Other premature depolarization: Secondary | ICD-10-CM

## 2015-12-12 DIAGNOSIS — I481 Persistent atrial fibrillation: Secondary | ICD-10-CM | POA: Insufficient documentation

## 2015-12-12 DIAGNOSIS — I499 Cardiac arrhythmia, unspecified: Principal | ICD-10-CM | POA: Insufficient documentation

## 2015-12-12 DIAGNOSIS — I480 Paroxysmal atrial fibrillation: Secondary | ICD-10-CM

## 2015-12-12 NOTE — Procedures (Signed)
Brittany Stokes arrived for a 24 hour Holter Monitor appointment. The monitor number was  [id number]. Start time of the monitor was . Patient was given verbal instructions regarding the monitor as stated in the patient diary form. Brittany Stokes was given verbal instruction to return the monitor on 12/13/2015.  Patient signed loan agreement stating that he/she is responsible to return the monitor. Brittany Stokes verbalized understanding of all instructions given.  Dr. Delane Ginger ordered the test for cardiac dysrhythmia.

## 2015-12-14 NOTE — Procedures (Addendum)
Holter was returned and data for patient Brittany Stokes was downloaded and sent to [facility] (Holter System) with no technical problems.  Study ready for holter scanning.

## 2015-12-15 LAB — POC ELECTROCARDIOGRAM WITH RHYTHM STRIP: QTC: 489

## 2015-12-16 ENCOUNTER — Ambulatory Visit
Admission: RE | Admit: 2015-12-16 | Discharge: 2015-12-16 | Disposition: A | Discharge status: Home / Self Care | Payer: Medicare Other | Source: Ambulatory Visit

## 2015-12-16 NOTE — Procedures (Signed)
Holter study for patient Brittany Stokes was scanned and will be sent to physician's Cardiofile box (Holter system) for review and final interpretation.

## 2015-12-19 ENCOUNTER — Ambulatory Visit
Admission: RE | Admit: 2015-12-19 | Discharge: 2015-12-19 | Disposition: A | Discharge status: Home / Self Care | Payer: Medicare Other | Source: Ambulatory Visit | Attending: Cardiovascular Disease | Admitting: Cardiovascular Disease

## 2015-12-19 DIAGNOSIS — I428 Other cardiomyopathies: Principal | ICD-10-CM | POA: Insufficient documentation

## 2015-12-19 LAB — ECHOCARDIOGRAM COMPLETE
IVSD 2D: 1.16 cm (ref 0.6–0.9)
LEFT INTERNAL DIMENSION IN SYSTOLE: 2.55 cm
LEFT VENTRICULAR INTERNAL DIMENSION IN DIASTOLE: 4.18 cm (ref 3.8–5.2)
LVEF (EST): 55 %
MV VALVE AREA P 1/2 METHOD: 4.21 cm2
POSTERIOR WALL: 0.96 cm (ref 0.6–0.9)
RV D(2D): 2.52 cm (ref 1.9–?)
TAPSE: 1.4 cm
TV PEAK SYSTOLIC PULMONARY ARTERY PRESSURE: 42.35 mmHg

## 2015-12-31 LAB — ELECTROCARDIOGRAM WITH RHYTHM STRIP
QTC: 393
QTC: 399
QTC: 413

## 2016-01-02 ENCOUNTER — Encounter: Payer: Self-pay | Admitting: Cardiovascular Disease

## 2016-01-02 NOTE — Telephone Encounter (Signed)
From: Brittany Stokes  To: Brittany Ace, MD  Sent: 01/02/2016 9:07 AM PDT  Subject: Test Result Question    Dr. Candiss Norse,  I just enrolled In [facility] so was able to pick the results of the ECHOCARDIOGRAM done on May 8 th. I was happy to see the higher ejection factor but was very surprised to see Stage ll left ventricular diastolic function is abnormal. THis has NEVER been abnormal even on previous ECHO done at [facility]. Is this a concern? WHat does it mean? I am seeing you on Friday, June 2 nd. At 2:00. We are leaving a week later for two months to the [facility]. Should this be addressed sooner? Will this create a problem?  I need your guidance and expertise to feel a level of comfort at this point.  Thank you so much. Brittany Stokes [phone]. [phone]

## 2016-01-03 NOTE — Progress Notes (Signed)
Date of Service: 01/02/2016    Following MD: Dr. [name]  Implanting MD: Dr. Aileen Fass  Referring MD: Dr. Zenaida Deed    Diagnosis: I49.5 Sick Sinus Syndrome / Brady-Tachy    Device:  [place] [id number] ADVANTIO  Implant date: 10/16/2011  RA Lead: [place] Model 4469 [place] II Sterox EZ  Implant date: 10/16/2011  RV Lead: [place] Model 4470 [place] II Sterox EZ  Implant date: 10/16/2011  Underlying rhythm: N/A    Settings:  Mode DDDR  Lower rate 60 bpm  Max tracking rate 130 bpm  Brady max sensor rate 130 bpm  AV delay 350/350 ms  RA sensing sensitivity 0.2 mV  RV sensing sensitivity 0.6 mV  RA pacing polarity: Bipolar  RV pacing polarity: Bipolar  RA pacing: Amplitude 2.0 V @ Pulse Width 0.4 ms  RV pacing: Amplitude 1.1 V @ Pulse Width 0.4 ms    RA Test:  RA sensing 1.5 mV  RA pacing threshold: Amplitude N/A  @ Pulse Width      RV Test:  RV sensing 23.3 mV  RV pacing threshold: Amplitude 0.6 V @ Pulse Width 0.4 ms    Battery:  Battery status MOS  Battery remaining longevity 36 months  Battery at MOL 1     Percent Paced:  RA percent paced 0 %  RV percent paced 62 %

## 2016-01-03 NOTE — Progress Notes (Addendum)
Latitude Remote, reviewed on 01/02/16.  Brittany Stokes had a scheduled remote transmission that is too early to bill.   Patient has a [employer] representative.    Device function is normal, Lead function is normal  Battery status is good, estimating 3 years  Presenting EGM: AFib, Vp  Arrhythmias/Comments:   No VHR events.  11 ATR events. EGMs show AFib, the longest event is started on 12/22/15 and is still ongoing.    Patient has a history of atrial arrhythmias and is on Pradaxa and Toprol. She saw Lorretta Harp MD in April and it was noted, "Ablation discussed- deferred, until resolution of MR."    Next scheduled remote is August and office visit is April 2018.   Interrogated by: Antoine Poche, MS, HLT II    [name] MD (HLT II supervising)  Department of Internal Medicine, Division of Cardiology    I have personally reviewed the report and download and agree with the findings.  [name], P.A  Department of Internal Medicine,  Division of Cardiology.

## 2016-01-13 ENCOUNTER — Ambulatory Visit: Payer: Medicare Other | Attending: Cardiovascular Disease | Admitting: Cardiovascular Disease

## 2016-01-13 ENCOUNTER — Encounter: Payer: Self-pay | Admitting: Cardiovascular Disease

## 2016-01-13 VITALS — BP 142/84 | HR 82 | Resp 18 | Ht 64.0 in | Wt 150.8 lb

## 2016-01-13 DIAGNOSIS — I10 Essential (primary) hypertension: Secondary | ICD-10-CM | POA: Insufficient documentation

## 2016-01-13 DIAGNOSIS — R9431 Abnormal electrocardiogram [ECG] [EKG]: Secondary | ICD-10-CM | POA: Insufficient documentation

## 2016-01-13 DIAGNOSIS — I495 Sick sinus syndrome: Secondary | ICD-10-CM | POA: Insufficient documentation

## 2016-01-13 DIAGNOSIS — I481 Persistent atrial fibrillation: Secondary | ICD-10-CM | POA: Insufficient documentation

## 2016-01-13 DIAGNOSIS — I340 Nonrheumatic mitral (valve) insufficiency: Principal | ICD-10-CM | POA: Insufficient documentation

## 2016-01-13 DIAGNOSIS — Z950 Presence of cardiac pacemaker: Secondary | ICD-10-CM | POA: Insufficient documentation

## 2016-01-13 DIAGNOSIS — Z7901 Long term (current) use of anticoagulants: Secondary | ICD-10-CM | POA: Insufficient documentation

## 2016-01-13 NOTE — Nursing Note (Signed)
Vitals were taken, screened for pain, pharmacy and allergies verified. EKG was also done.      Durwood Dittus MA
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
## 2016-01-13 NOTE — Nursing Note (Signed)
Medication list reviewed by pt and updated. N. Camille Dragan LVN

## 2016-01-15 LAB — POC ELECTROCARDIOGRAM WITH RHYTHM STRIP: QTC: 414

## 2016-01-18 NOTE — Progress Notes (Signed)
Date of Service: 01/13/2016    Following MD: Dr. [name]  Implanting MD: Dr. Aileen Fass  Referring MD: Dr. Zenaida Deed    Diagnosis: I49.5 Sick Sinus Syndrome / Brady-Tachy    Device:  [place] [id number] ADVANTIO  Implant date: 10/16/2011  RA Lead: [place] Model 4469 [place] II Sterox EZ  Implant date: 10/16/2011  RV Lead: [place] Model 4470 [place] II Sterox EZ  Implant date: 10/16/2011    Settings:  Mode DDDR  Lower rate 60 bpm  Max tracking rate 130 bpm  Brady max sensor rate 130 bpm  AV delay 350/350 ms  RA sensing sensitivity 0.2 mV  RV sensing sensitivity 0.6 mV  RA pacing polarity: Bipolar  RV pacing polarity: Bipolar  RA pacing: Amplitude 2.0 V @ Pulse Width 0.4 ms  RV pacing: Amplitude 1.1 V @ Pulse Width 0.4 ms    RA Test:  RA impedance 567 ohms  RA sensing 2.8 mV    RV Test:  RV impedance 517 ohms  RV sensing 20.0 mV  RV pacing threshold: Amplitude 0.6 V @ Pulse Width 0.4 ms    Battery:  Battery status BOS  Battery remaining longevity 36 months  Battery at 57%     Percent Paced:  RA percent paced 0 %  RV percent paced 62 %

## 2016-01-18 NOTE — Progress Notes (Addendum)
Latitude  Remote, reviewed on 01/13/2016.  A remote transmission was sent on [facility].  Patient has Careers adviser.   This was an unscheduled  remote transmission.     Device function is normal, Lead function is normal  Battery status is MOL I, with an estimated longevity of 3 years.   Presenting EGM:  AS/VS at atrial flutter     Arrhythmias/Comments:   1 AMS episode with atrial burden of 100% and the longest is >48 hours. Patient is on Dabigatran (PRADAXA)   No new NSVT episode     Next scheduled remote is Aug and office visit is April.   Interrogated by: Jack Quarto CCT, CRAT, HLT II    [name] MD (HLT II supervising)  Department of Internal Medicine, Division of Cardiology    100% atrial burden with controlled ventricular rate on pradaxa.  I have personally reviewed the report and download and agree with the findings.  [name], P.A  Department of Internal Medicine,  Division of Cardiology.

## 2016-03-05 ENCOUNTER — Encounter: Payer: Self-pay | Admitting: Cardiovascular Disease

## 2016-03-09 NOTE — Progress Notes (Signed)
Date of Service: 03/05/2016    Following MD: Dr. [name]  Implanting MD: Dr. Aileen Fass  Referring MD: Dr. Zenaida Deed    Diagnosis: I49.5 Sick Sinus Syndrome / Brady-Tachy    Device:  [place] [id number] ADVANTIO  Implant date: 10/16/2011  RA Lead: [place] Model 4469 [place] II Sterox EZ  Implant date: 10/16/2011  RV Lead: [place] Model 4470 [place] II Sterox EZ  Implant date: 10/16/2011    Settings:  Mode DDDR  Lower rate 60 bpm  Max tracking rate 130 bpm  Brady max sensor rate 130 bpm  AV delay 350/350 ms  RA sensing sensitivity 0.2 mV  RV sensing sensitivity 0.6 mV  RA pacing polarity: Bipolar  RV pacing polarity: Bipolar  RA pacing: Amplitude 2.0 V @ Pulse Width 0.4 ms  RV pacing: Amplitude 1.0 V @ Pulse Width 0.4 ms    RA Test:  RA impedance 571 ohms  RA pacing threshold: Amplitude 0.5 V @ Pulse Width 0.4 ms    RV Test:  RV impedance 492 ohms  RV pacing threshold: Amplitude 0.5 V @ Pulse Width 0.4 ms    Battery:  Battery status BOS  Battery remaining longevity 36 months    Percent Paced:  RA percent paced 0 %  RV percent paced 62 %

## 2016-03-09 NOTE — Progress Notes (Signed)
Latitude Remote, reviewed on 03/05/16.  Brianni Gonzalo had a unscheduled remote transmission.   Patient has a Engineer, site [phone] dual chamber pacemaker.    Device function is Normal, Lead function is Normal  Battery status is:  Good estimated longevity is 3 years.  Presenting EGM: Afib/Vp at 95-100bpm    Arrhythmias/Comments: Chronic Afib,Atrial burden is 100%. Pt is taking Pradaxa and Digoxin.    Next scheduled remote is in August and office visit is  Yearly in April.   Interrogated by: Rulon Abide HLT II    [name] MD (HLT II supervising)  Department of Internal Medicine, Division of Cardiology

## 2016-03-10 NOTE — Progress Notes (Signed)
I have personally reviewed the report and agree with the findings.

## 2016-04-02 ENCOUNTER — Ambulatory Visit: Payer: Medicare Other | Attending: Cardiovascular Disease

## 2016-04-02 DIAGNOSIS — Z45018 Encounter for adjustment and management of other part of cardiac pacemaker: Principal | ICD-10-CM | POA: Insufficient documentation

## 2016-04-03 NOTE — Progress Notes (Signed)
Date of Service: 04/02/2016    Following MD: Dr. [name]  Implanting MD: Dr. Aileen Fass  Referring MD: Dr. Zenaida Deed    Diagnosis: I49.5 Sick Sinus Syndrome / Brady-Tachy    Device:  [place] [phone] ADVANTIO  Implant date: 10/16/2011  RA Lead: [place] Model 4469 [place] II Sterox EZ  Implant date: 10/16/2011  RV Lead: [place] Model 4470 [place] II Sterox EZ  Implant date: 10/16/2011  Underlying rhythm: N/A    Settings:  Mode DDDR  Lower rate 60 bpm  Max tracking rate 130 bpm  Brady max sensor rate 130 bpm  AV delay 350/350 ms  RA sensing sensitivity 0.2 mV  RV sensing sensitivity 0.6 mV  RA pacing polarity: Bipolar  RV pacing polarity: Bipolar  RA pacing: Amplitude 2.0 V @ Pulse Width 0.4 ms  RV pacing: Amplitude 1.1 V @ Pulse Width 0.4 ms    RA Test:  RA impedance 549 ohms  RA sensing 1.9 mV  RA pacing threshold: Amplitude N/A  @ Pulse Width      RV Test:  RV impedance 460 ohms  RV sensing 20.2 mV  RV pacing threshold: Amplitude 0.6 V @ Pulse Width 0.4 ms    Battery:  Battery status MOS  Battery remaining longevity 30 months  Battery at MOL 1     Percent Paced:  RA percent paced 0 %  RV percent paced 61 %

## 2016-04-03 NOTE — Progress Notes (Signed)
Latitude Remote, reviewed on 04/02/16.  Brittany Stokes had a scheduled remote transmission.   Patient has a Holiday representative.    Device function is normal, Lead function is normal  Battery status is good, estimating 2.5 years  Presenting EGM: AFib, Vp (AFib since 03/22/16 at 6:36 pm)  Arrhythmias/Comments:   No VHR events  11 ATR events. EGMs show AFib. The longest event is the one the patient is currently in. She has been in AFib for 11 days.  Patient has a history of atrial arrhythmias and is on Pradaxa, Digoxin and Toprol.     Next scheduled remote is November and office visit is April 2018.   Interrogated by: Antoine Poche, MS, HLT II    [name] MD (HLT II supervising)  Department of Internal Medicine, Division of Cardiology

## 2016-04-05 ENCOUNTER — Telehealth: Payer: Self-pay | Admitting: Cardiovascular Disease

## 2016-04-05 ENCOUNTER — Encounter: Payer: Self-pay | Admitting: Cardiovascular Disease

## 2016-04-05 NOTE — Telephone Encounter (Signed)
Patient calling to report that recent chest x-ray performed at [facility] (results available in [facility]) revealed an enlargement of her heart.  She wants Dr. Candiss Norse to be aware and wonders if she should schedule with him soon.  She can be reached at [phone].    Raeford Razor; [place] [facility] clinic  Phone: [phone]  Fax  [phone]

## 2016-04-05 NOTE — Telephone Encounter (Signed)
Per chart review, pt's last CXR done at [facility] on 11/16/15 reads:     Findings: Frontal and lateral views of the chest were obtained. Bipolar  pacemaker is in place from the left box partially obscuring left upper zone  on the PA view. Heart size is normal and there are no signs of CHF. Pacer  leads appear intact. There is no evidence of pleural fluid or mass. The  bones are osteopenic with degenerative changes    From care everywhere, patient has CXRs going back to 2011 that document heart mildly enlarged. Unable to view actual CXR image in [place] for comparison.     Most recent CXR reading on 03/05/16 from Care Everywhere reads:    Cardiomediastinal silhouette: Cardiomediastinal silhouette is  enlarged. Pacemaker.Acquanetta Belling to Dr. Candiss Norse for review.     Spoke with patient. Identity verified. Advised patient that I am forwarding her message to Dr. Candiss Norse for review and will get back to her with his recommendations. Patient verbalizes understanding and has no further questions at this time.     Katy Apo RN, BSN

## 2016-04-06 NOTE — Progress Notes (Signed)
I have personally reviewed the report and agree with the findings.

## 2016-04-06 NOTE — Progress Notes (Signed)
Date of Service: 04/05/2016    Following MD: Dr. [name]  Implanting MD: Dr. Aileen Fass  Referring MD: Dr. Zenaida Deed    Diagnosis: I49.5 Sick Sinus Syndrome / Brady-Tachy    Device:  [place] [phone] ADVANTIO  Implant date: 10/16/2011  RA Lead: [place] Model 4469 [place] II Sterox EZ  Implant date: 10/16/2011  RV Lead: [place] Model 4470 [place] II Sterox EZ  Implant date: 10/16/2011  Underlying rhythm: N/A    Settings:  Mode DDDR  Lower rate 60 bpm  Max tracking rate 130 bpm  Brady max sensor rate 130 bpm  AV delay 350/350 ms  RA sensing sensitivity 0.2 mV  RV sensing sensitivity 0.6 mV  RA pacing polarity: Bipolar  RV pacing polarity: Bipolar  RA pacing: Amplitude 2.0 V @ Pulse Width 0.4 ms  RV pacing: Amplitude 1.0 V @ Pulse Width 0.4 ms    RA Test:  RA impedance 549 ohms  RA sensing 1.1 mV  RA pacing threshold: Amplitude N/A  @ Pulse Width      RV Test:  RV impedance 464 ohms  RV sensing 21.9 mV  RV pacing threshold: Amplitude 0.5 V @ Pulse Width 0.4 ms    Battery:  Battery status MOS  Battery remaining longevity 30 months  Battery at MOS 1     Percent Paced:  RA percent paced 0 %  RV percent paced 61 %

## 2016-04-06 NOTE — Progress Notes (Addendum)
Latitude Remote, reviewed on 04/05/16.  Brittany Stokes had an unscheduled patient initiated remote transmission.   Patient has a Holiday representative.    Device function is normal, Lead function is normal  Battery status is good, estimating 2.5 years  Presenting EGM: AFib, Vp and Vs (AFib since 03/22/16 at 6:36 pm)  Arrhythmias/Comments:   No VHR events  1 ATR event since the last remote on 04/02/16 which the patient is currently in.   Patient has a history of atrial arrhythmias and is on Pradaxa, Digoxin and Toprol.     I sent a mychart message to the patient regarding her manual downloads. It appears she has been downloading monthly.    Next scheduled remote is November and office visit is April 2018.   Interrogated by: Antoine Poche, MS, HLT II    [name] MD (HLT II supervising)  Department of Internal Medicine, Division of Cardiology    I have personally reviewed the report and download and agree with the findings.  [name], P.A  Department of Internal Medicine,  Division of Cardiology.

## 2016-04-09 NOTE — Telephone Encounter (Signed)
Spoke with patient, identity verified. Advised patient as written below per Dr. Candiss Norse. Patient agreeable to repeat ECHO in 6 months. Patient given phone number of heart station to schedule [phone]. Patient verbalizes understanding and has no further questions.    Katy Apo RN, BSN

## 2016-04-09 NOTE — Telephone Encounter (Signed)
CXR is not a great test to evaluate for heart size and function.  She had an echocardiogram in May which showed squeeze of the heart and the size of the heart were normal.  We can get a repeat echo in Nov (6 monts from prior).  And this way can discuss the results during clinic visit in dec.    pls order echo for mid November.    gs

## 2016-04-27 ENCOUNTER — Ambulatory Visit
Admission: RE | Admit: 2016-04-27 | Discharge: 2016-04-27 | Disposition: A | Discharge status: Home / Self Care | Payer: Medicare Other | Source: Ambulatory Visit | Attending: Cardiovascular Disease | Admitting: Cardiovascular Disease

## 2016-04-27 ENCOUNTER — Other Ambulatory Visit: Payer: Self-pay | Admitting: Cardiovascular Disease

## 2016-04-27 DIAGNOSIS — I081 Rheumatic disorders of both mitral and tricuspid valves: Principal | ICD-10-CM | POA: Insufficient documentation

## 2016-04-27 DIAGNOSIS — I340 Nonrheumatic mitral (valve) insufficiency: Principal | ICD-10-CM

## 2016-04-27 LAB — ECHOCARDIOGRAM COMPLETE
IVSD 2D: 1 cm (ref 0.6–0.9)
LEFT INTERNAL DIMENSION IN SYSTOLE: 2.88 cm
LEFT VENTRICULAR INTERNAL DIMENSION IN DIASTOLE: 4.33 cm (ref 3.8–5.2)
LVEF (EST): 55 %
MV E-WAVE VEL/E'TISSUE VEL LAT: 15.133 Decel
MV VALVE AREA P 1/2 METHOD: 3.68 cm2
POSTERIOR WALL: 1.01 cm (ref 0.6–0.9)
RV D(2D): 2.03 cm (ref 1.9–?)
TAPSE: 1.3 cm
TV PEAK SYSTOLIC PULMONARY ARTERY PRESSURE: 26.43 mmHg

## 2016-04-30 ENCOUNTER — Telehealth: Payer: Self-pay | Admitting: Cardiovascular Disease

## 2016-04-30 NOTE — Telephone Encounter (Signed)
Results released. Gamble Enderle, RN

## 2016-04-30 NOTE — Telephone Encounter (Signed)
Patient would like her echo result to be released on mychart.    Thank You,  Irving Shows

## 2016-05-21 ENCOUNTER — Encounter: Payer: Self-pay | Admitting: Cardiovascular Disease

## 2016-05-21 NOTE — Telephone Encounter (Signed)
From: [name]  To: Sidney Ace, MD  Sent: 05/21/2016 11:29 AM PDT  Subject: Test Result Question    I am referencing the Echo done onSept.15, [id number]. I do not have an appointment till December and am concerned about worsening of Tricuspid valve and what I can be doing as far as improving  this new result. I am not certain I understand if this is something I need to be concerned about  . I was wondering if I need to see Dr,  . Candiss Norse before my scheduled appointment to feel a level of confidence  as we are heading for a 17 day cruise in November and whether cruise is recommended. I would appreciate your help. I am, also, having a more difficult time   keeping my blood pressure down regularly. I am very strict with my low salt diet.  I will look forward to your comments, suggestions and help. thank you  [facility].

## 2016-05-24 ENCOUNTER — Telehealth: Payer: Self-pay | Admitting: Cardiovascular Disease

## 2016-05-24 NOTE — Telephone Encounter (Signed)
Called and left message on both numbers to schedule appointment with Dr. Candiss Norse.

## 2016-05-24 NOTE — Telephone Encounter (Signed)
Please schedule and contact patient with date and time.  Thanks. Daiva Eves, RN, Cardiology & Nephrology

## 2016-05-24 NOTE — Telephone Encounter (Signed)
Called and spoke with patient.  Identity verified.  States for the last couple of weeks her blood pressure has been ranging from the 123456 to 99991111 systolic, and she is "really good" about avoiding sodium in her diet.  She denies edema or other symptoms, except feeling tired.  She saw on her echo results that her tricuspid regurg went from 2+ to 3+, and she questions the significance of this.    She is leaving for a cruise on Nov. 15th, and will be gone until Dec. 2nd, 2017.  She wonders if she needs to see Dr. Candiss Norse before she leaves on her trip.    I told her I would send this message to Dr. Candiss Norse. Daiva Eves, RN, Cardiology & Nephrology

## 2016-05-24 NOTE — Telephone Encounter (Signed)
You can add her for oct 23 at 4pm or end of clinic at 5:20pm    gs

## 2016-05-24 NOTE — Telephone Encounter (Signed)
Patient states there's been some recent changes on her last echo and she is concerned about it.  She feels she's doing everything she can do to control BP, but its still high.

## 2016-05-25 NOTE — Telephone Encounter (Signed)
Patient called back and scheduled appointment

## 2016-05-25 NOTE — Telephone Encounter (Signed)
Patient scheduled for 06/04/16 with Dr. Candiss Norse.    Lindie Spruce, RN

## 2016-06-04 ENCOUNTER — Ambulatory Visit: Payer: Medicare Other | Attending: Cardiovascular Disease | Admitting: Cardiovascular Disease

## 2016-06-04 VITALS — BP 148/77 | HR 68 | Ht 64.0 in | Wt 150.8 lb

## 2016-06-04 DIAGNOSIS — Z950 Presence of cardiac pacemaker: Secondary | ICD-10-CM | POA: Insufficient documentation

## 2016-06-04 DIAGNOSIS — I10 Essential (primary) hypertension: Secondary | ICD-10-CM | POA: Insufficient documentation

## 2016-06-04 DIAGNOSIS — I340 Nonrheumatic mitral (valve) insufficiency: Secondary | ICD-10-CM | POA: Insufficient documentation

## 2016-06-04 DIAGNOSIS — I349 Nonrheumatic mitral valve disorder, unspecified: Principal | ICD-10-CM | POA: Insufficient documentation

## 2016-06-04 DIAGNOSIS — I481 Persistent atrial fibrillation: Secondary | ICD-10-CM | POA: Insufficient documentation

## 2016-06-04 DIAGNOSIS — I4891 Unspecified atrial fibrillation: Secondary | ICD-10-CM | POA: Insufficient documentation

## 2016-06-04 NOTE — Nursing Note (Signed)
EKG performed as ordered. Vital signs taken, screened for pain, allergies and pharmacy verified.Vita Mindzyak, MAI

## 2016-06-06 LAB — POC ELECTROCARDIOGRAM WITH RHYTHM STRIP: QTC: 409
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
## 2016-07-16 ENCOUNTER — Ambulatory Visit: Payer: Medicare Other | Attending: Cardiovascular Disease

## 2016-07-16 ENCOUNTER — Encounter: Payer: Self-pay | Admitting: Cardiovascular Disease

## 2016-07-16 DIAGNOSIS — Z45018 Encounter for adjustment and management of other part of cardiac pacemaker: Principal | ICD-10-CM | POA: Insufficient documentation

## 2016-07-17 ENCOUNTER — Encounter: Payer: Self-pay | Admitting: Cardiovascular Disease

## 2016-07-18 ENCOUNTER — Telehealth: Payer: Self-pay | Admitting: Cardiovascular Disease

## 2016-07-18 DIAGNOSIS — I10 Essential (primary) hypertension: Principal | ICD-10-CM

## 2016-07-18 MED ORDER — LISINOPRIL 20 MG TABLET
20.0000 mg | ORAL_TABLET | Freq: Every day | ORAL | 5 refills | Status: DC
Start: 2016-07-18 — End: 2017-01-01

## 2016-07-18 MED ORDER — AMLODIPINE 5 MG TABLET
2.5000 mg | ORAL_TABLET | Freq: Every day | ORAL | 5 refills | Status: DC
Start: 2016-07-18 — End: 2016-08-31

## 2016-07-18 NOTE — Telephone Encounter (Addendum)
Per Dr. Keturah Barre last office visit note:    --meds actively being adjusted by Dr Delane Ginger.  Will defer further management to her.      Called and spoke with patient.  She is tearful sounding and states someone needs to help her, she doesn't know what to do anymore.  States her blood pressure today was 190/110.  Heart rate is "fine".  Denies symptoms such as chest pain or shortness of breath; states her head hurts.  Denies that it is unbearable or excruciating.  She already took her daily metoprolol, which she usually takes in the afternoon.  She did purchase an arm cuff blood pressure monitor.    She also mentioned that she sent in her "latitude" to the device clinic; states she didn't come to her 07/16/16 appointment. She said she had "5 hours of palpitations" the other day.    I told her I would send this message to her physicians, as well as the device clinic, and would call her back with any instructions.  She was agreeable to this.  Daiva Eves, RN, Cardiology & Nephrology

## 2016-07-18 NOTE — Telephone Encounter (Signed)
Noted. Agree with recs.    Electronically signed by  Hermina Barters MD, MAS  Attending Physician  Division of Cardiovascular Medicine

## 2016-07-18 NOTE — Telephone Encounter (Signed)
Called and spoke with patient.  Confirmed cardiac meds, per current medication list.  She is taking all as prescribed.  Informed of Dr. Keturah Barre instructions.  She verbalized understanding.  Medications and lab test ordered.   Daiva Eves, RN, Cardiology & Nephrology

## 2016-07-18 NOTE — Telephone Encounter (Signed)
This patient sent 2 remote downloads this week. Both downloads show 100% AFib burden with the majority of her ventricular rates between 60-90 bpm. Patient has known atrial arrhythmias. There are a few times where her ventricular rate increases to 130 bpm however they are not long enough to record as a ventricular high rate event. Patient is on Prqadxa, Digoxin and Metoprolol. I will notify the nurse.  Antoine Poche, MS, HLT II

## 2016-07-18 NOTE — Telephone Encounter (Signed)
Patient has been experiencing high BP even though she has made adjustments.  Her last BP reading this morning was 175/108.  She takes medication but it doesn't work consistently.

## 2016-07-18 NOTE — Telephone Encounter (Signed)
pls confirm home meds with her.    Increase lisinopril to 20mg  po daily  Start norvasc 2.5 daily    BMP in one week.    gs

## 2016-07-19 NOTE — Progress Notes (Addendum)
Latitude Remote, reviewed on 07/17/16.  Brittany Stokes had a patient intiated remote transmission.   Patient has a Holiday representative.    Device function is normal, Lead function is normal  Battery status is good, estimating 2.5 years  Presenting EGM: AFib, Vp and Vs  Arrhythmias/Comments:   No VHR events  Still in 100% AFib since yesterday's remote.  Patient has a history of atrial arrhythmias and is on Pradaxa, Digoxin and Toprol.    Next scheduled remote is March and office visit is 11/21/16.   Interrogated by: Antoine Poche, MS, HLT II    [name] MD (HLT II supervising)  Department of Internal Medicine, Division of Cardiology    Atrial fib with controlled ventricular rates.  On pradaxa, dig and toprol xl.  I have personally reviewed the report and download and agree with the findings.  [name], P.A  Department of Internal Medicine,  Division of Cardiology.

## 2016-07-19 NOTE — Progress Notes (Signed)
Date of Service: 07/17/2016    Following MD: Dr. [name]  Implanting MD: Dr. Aileen Fass  Referring MD: Dr. Zenaida Deed    Diagnosis: I49.5 Sick Sinus Syndrome / Brady-Tachy    Device:  [place] [id number] ADVANTIO  Implant date: 10/16/2011  RA Lead: [place] Model 4469 [place] II Sterox EZ  Implant date: 10/16/2011  RV Lead: [place] Model 4470 [place] II Sterox EZ  Implant date: 10/16/2011  Underlying rhythm: N/A    Settings:  Mode DDDR  Lower rate 60 bpm  Max tracking rate 130 bpm  Brady max sensor rate 130 bpm  AV delay 350/350 ms  RA sensing sensitivity 0.2 mV  RV sensing sensitivity 0.6 mV  RA pacing polarity: Bipolar  RV pacing polarity: Bipolar  RA pacing: Amplitude 2.0 V @ Pulse Width 0.4 ms  RV pacing: Amplitude 1.0 V @ Pulse Width 0.4 ms    RA Test:  RA impedance 533 ohms  RA sensing 3.9 mV  RA pacing threshold: Amplitude N/A  @ Pulse Width      RV Test:  RV impedance 443 ohms  RV sensing 20.5 mV  RV pacing threshold: Amplitude 0.5 V @ Pulse Width 0.4 ms    Battery:  Battery status MOS  Battery remaining longevity 30 months  Battery at [employer]     Percent Paced:  RA percent paced 0 %  RV percent paced 62 %

## 2016-07-19 NOTE — Progress Notes (Signed)
Latitude Remote, reviewed on 07/16/16.  Brittany Stokes had a scheduled remote transmission.   Patient has a Holiday representative.    Device function is normal, Lead function is normal  Battery status is good, estimating 2.5 years  Presenting EGM: AFib, Vp  Arrhythmias/Comments:   No VHR events  10 ATR events, totaling 100% of the time. EGMs show AFib.  Patient has a history of atrial arrhythmias and is on Pradaxa, Digoxin and Toprol.     Next scheduled remote is March and office visit is 11/21/16.   Interrogated by: Antoine Poche, MS, HLT II    [name] MD (HLT II supervising)  Department of Internal Medicine, Division of Cardiology

## 2016-07-19 NOTE — Progress Notes (Signed)
Date of Service: 07/16/2016    Following MD: Dr. [name]  Implanting MD: Dr. Aileen Fass  Referring MD: Dr. Zenaida Deed    Diagnosis: I49.5 Sick Sinus Syndrome / Brady-Tachy    Device:  [place] [id number] ADVANTIO  Implant date: 10/16/2011  RA Lead: [place] Model 4469 [place] II Sterox EZ  Implant date: 10/16/2011  RV Lead: [place] Model 4470 [place] II Sterox EZ  Implant date: 10/16/2011  Underlying rhythm: N/A    Settings:  Mode DDDR  Lower rate 60 bpm  Max tracking rate 130 bpm  Brady max sensor rate 130 bpm  AV delay 350/350 ms  RA sensing sensitivity 0.2 mV  RV sensing sensitivity 0.6 mV  RA pacing polarity: Bipolar  RV pacing polarity: Bipolar  RA pacing: Amplitude 2.0 V @ Pulse Width 0.4 ms  RV pacing: Amplitude 1.0 V @ Pulse Width 0.4 ms    RA Test:  RA impedance 580 ohms  RA sensing 3.8 mV  RA pacing threshold: Amplitude N/A  @ Pulse Width      RV Test:  RV impedance 478 ohms  RV sensing 24.8 mV  RV pacing threshold: Amplitude 0.5 V @ Pulse Width 0.4 ms    Battery:  Battery status MOS  Battery remaining longevity 30 months  Battery at [employer]     Percent Paced:  RA percent paced 0 %  RV percent paced 62 %

## 2016-07-20 NOTE — Progress Notes (Signed)
I have personally reviewed the report and agree with the findings.

## 2016-07-30 ENCOUNTER — Ambulatory Visit: Payer: Medicare Other | Attending: Cardiovascular Disease

## 2016-07-30 DIAGNOSIS — I10 Essential (primary) hypertension: Principal | ICD-10-CM | POA: Insufficient documentation

## 2016-07-30 LAB — BASIC METABOLIC PANEL
CALCIUM: 10 mg/dL (ref 8.6–10.5)
CARBON DIOXIDE TOTAL: 26 mmol/L (ref 24–32)
CHLORIDE: 103 mmol/L (ref 95–110)
CREATININE BLOOD: 0.44 mg/dL (ref 0.44–1.27)
GLUCOSE: 120 mg/dL — AB (ref 70–99)
POTASSIUM: 4.3 mmol/L (ref 3.3–5.0)
SODIUM: 135 mmol/L (ref 135–145)
UREA NITROGEN, BLOOD (BUN): 9 mg/dL (ref 8–22)

## 2016-08-01 ENCOUNTER — Telehealth: Payer: Self-pay | Admitting: Cardiovascular Disease

## 2016-08-01 NOTE — Telephone Encounter (Signed)
Called and spoke with patient  Identity verified.  I told her that her bmp was normal, except her blood sugar was a little elevated at 120.  She said she was spoiled because Judi Cong gives her results the same day. I told her that the results will be released today, via MyChart.    She states her blood pressure's are "all over the place", and said this past Sunday, 07/29/16, her blood pressure was 190/110.  We discussed at length her medication regimen. She has been taking, or not taking, her amlodipine, and possibly her lisinopril, based on her blood pressure readings.  I gave her specific instructions to take her metoprolol and amlodipine in the morning, around the same time, then take her lisinopril in the evening, ~ 12 hrs later.  I advised her to do so because she said sometimes her systolic will drop to 99, and at times she feels dizzy.      She confirmed that she worries a lot about her blood pressure.  I advised that she not take her blood pressure multiple times a day unless she is experiencing symptoms, but it is okay to take it in the morning to see how it is responding to her medications.    She verbalized understanding and agreed to try this regimen for a week.  She will call us with any additional questions or problems.  Daiva Eves, RN, Cardiology

## 2016-08-01 NOTE — Telephone Encounter (Signed)
Patient is calling in regards to her test results. Said that she is upset that it is taking so long for people at [facility] to get back to her with the results. Told her that I would send a message to the nurses and have them give her a call back.    [place]

## 2016-08-01 NOTE — Telephone Encounter (Signed)
Agree.      gs

## 2016-08-07 ENCOUNTER — Other Ambulatory Visit: Payer: Self-pay | Admitting: Cardiovascular Disease

## 2016-08-07 MED ORDER — DIGOX 250 MCG (0.25 MG) TABLET
250.0000 ug | ORAL_TABLET | Freq: Every day | ORAL | 11 refills | Status: DC
Start: 2016-08-07 — End: 2017-07-19

## 2016-08-07 NOTE — Telephone Encounter (Signed)
Ok to refilll.    Thnks,    gs

## 2016-08-20 ENCOUNTER — Ambulatory Visit
Admission: RE | Admit: 2016-08-20 | Discharge: 2016-08-20 | Disposition: A | Discharge status: Home / Self Care | Payer: Medicare Other | Source: Ambulatory Visit | Attending: Cardiovascular Disease | Admitting: Cardiovascular Disease

## 2016-08-20 ENCOUNTER — Other Ambulatory Visit: Payer: Self-pay

## 2016-08-20 DIAGNOSIS — I361 Nonrheumatic tricuspid (valve) insufficiency: Secondary | ICD-10-CM | POA: Insufficient documentation

## 2016-08-20 DIAGNOSIS — I340 Nonrheumatic mitral (valve) insufficiency: Principal | ICD-10-CM | POA: Insufficient documentation

## 2016-08-20 LAB — ECHOCARDIOGRAM COMPLETE
IVSD 2D: 1.16 cm (ref 0.6–0.9)
LEFT INTERNAL DIMENSION IN SYSTOLE: 3.28 cm
LEFT VENTRICULAR INTERNAL DIMENSION IN DIASTOLE: 4.43 cm (ref 3.8–5.2)
LVEF (EST): 60 %
MV E-WAVE VEL/E'TISSUE VEL LAT: 10.525
MV VALVE AREA P 1/2 METHOD: 3.42 cm2
POSTERIOR WALL: 0.82 cm (ref 0.6–0.9)
TAPSE: 1.51 cm
TV PEAK SYSTOLIC PULMONARY ARTERY PRESSURE: 30.56 mmHg

## 2016-08-21 ENCOUNTER — Telehealth: Payer: Self-pay | Admitting: Cardiovascular Disease

## 2016-08-21 NOTE — Telephone Encounter (Signed)
Patient is calling stating she had an echo done yesterday 1/8 in the morning.  She would like to get there results to her test.  She would like it to be posted on my chart as well.    Thank You,  Irving Shows

## 2016-08-21 NOTE — Telephone Encounter (Signed)
Forwarding to Dr. Candiss Norse for review and interpretation of echo.     Lindie Spruce, RN

## 2016-08-22 NOTE — Telephone Encounter (Addendum)
Called and spoke with patient.  Identity verified. I told her that the echo result was going to be automatically released today, so I will release it for her.      She wants to know if she can go on a cruise; she is anxious to book one, states, "I want to live my life", but isn't sure if she needs the Mitraclip procedure.    I told her I would send this message to Dr. Candiss Norse and we would get back to her.  She was agreeable to this.  Daiva Eves, RN, Cardiology

## 2016-08-23 NOTE — Telephone Encounter (Signed)
I called patient.    gs

## 2016-08-24 ENCOUNTER — Telehealth: Payer: Self-pay | Admitting: Cardiovascular Disease

## 2016-08-24 DIAGNOSIS — I340 Nonrheumatic mitral (valve) insufficiency: Principal | ICD-10-CM

## 2016-08-24 NOTE — Telephone Encounter (Signed)
Forwarding to phone room staff, please schedule echo and visit with Dr. Candiss Norse per his note below.    Lindie Spruce, RN

## 2016-08-24 NOTE — Telephone Encounter (Signed)
LVM for patient, scheduled Echo for Feb 1st at 3pm. Mailed appointment slip and provided [place] number to r/s if needed [phone])

## 2016-08-24 NOTE — Telephone Encounter (Signed)
Notes Recorded by Sidney Ace, MD on 08/23/2016 at 9:06 AM  Called pt at home with results. Ok to observe for now. No real CHF symptoms.    pls arrange for echo first week of march and visit with me prior to mar 18.    Thanks,    gs

## 2016-08-24 NOTE — Telephone Encounter (Signed)
Echo ordered.     Routing to discharge staff and phone room.    Please coordinate echo and f/u with Dr. Candiss Norse as indicated below.    Lindie Spruce, RN

## 2016-08-28 NOTE — Telephone Encounter (Signed)
Please reschedule echo for first week of March per Dr. Keturah Barre request below.    Lindie Spruce, RN

## 2016-08-29 NOTE — Telephone Encounter (Signed)
Echo scheduled for March 1st

## 2016-08-30 NOTE — Telephone Encounter (Signed)
Called and [facility] for patient to confirm apt for 10/18/16 @4 :00. Mailed reminder slip.  [place]

## 2016-08-30 NOTE — Telephone Encounter (Signed)
Forwarding to phone room staff, please reschedule appointment for after March 1st echo but prior to March 18th.    Thanks,  Lindie Spruce, RN

## 2016-08-31 ENCOUNTER — Other Ambulatory Visit: Payer: Self-pay | Admitting: Cardiovascular Disease

## 2016-08-31 MED ORDER — AMLODIPINE 2.5 MG TABLET
2.5000 mg | ORAL_TABLET | Freq: Every day | ORAL | 3 refills | Status: DC
Start: 2016-08-31 — End: 2016-09-10

## 2016-08-31 NOTE — Telephone Encounter (Signed)
Received request from pt's pharmacy to change 5 mg tablet of Norvasc to 2.5 mg tablet d/t cutting 5 mg tabs is causing them to crumble.    Refilled medication per RN protocol.

## 2016-09-10 ENCOUNTER — Telehealth: Payer: Self-pay | Admitting: Cardiovascular Disease

## 2016-09-10 NOTE — Telephone Encounter (Signed)
Discussed with Dr. Candiss Norse, ok for patient to stop Amlodipine. She can continue Toprol 25mg  daily and Lisinopril 5-10mg  daily (depeding on BP reading).    Called patient, relayed the above message from MD. I asked her to call back with any concerns.    Lindie Spruce, RN

## 2016-09-10 NOTE — Telephone Encounter (Signed)
Patient is calling stating she need advise on her Norvasc and other b/p medication.    States her blood pressure is starting to run low.  114/64 this is before her medication this morning.         Thank You,  Brittany Stokes

## 2016-09-10 NOTE — Telephone Encounter (Signed)
Patient reports that the last few weeks her BP has been low. Typically in am SBP 110s prior to medication. C/o dizziness when SBP below 100.  She has made some medication adjustments on her own.   She's been taking Toprol 25mg   (actual dose is 50mg ).   She is also cutting amlodipine in half, taking 1.25mg  daily. She takes 5mg -10mg  Lisinopril in the afternoon, depending on BP. The highest afternoon reading has been 140s.   If she is experiencing emotional stress, she does say BP can become very high.     Patient would like some direction on what medications she can eliminate or take PRN. She has mild pedal edema and asks if she can stop Amlodipine.   Will route to Dr. Candiss Norse and call patient back with plan.    Lindie Spruce, RN

## 2016-09-10 NOTE — Telephone Encounter (Signed)
Left message for patient to return call. Kiondre Grenz, RN

## 2016-09-13 ENCOUNTER — Ambulatory Visit: Payer: Medicare Other

## 2016-09-17 ENCOUNTER — Encounter: Payer: Self-pay | Admitting: Cardiovascular Disease

## 2016-10-09 ENCOUNTER — Telehealth: Payer: Self-pay | Admitting: Cardiovascular Disease

## 2016-10-09 NOTE — Telephone Encounter (Signed)
Patient called very distraught about her BP, says that it has been high for 12 hours with systolics in [id number].  She says that typically she is controlled with BP in 130's and that medications typically work for her.  She says that she woke up in the middle of the night because she was worried about her blood pressure and took metoprolol early.  She says that she feels like because her blood pressure is high that her "body is working harder", though she denies CP, SOB, HA or blurry vision.  She is wondering if she needs to go to the emergency room.    -Provided reassurance that isolated BP readings not concerning could be related to other factors, stress, illness or pain.  Continue BP medication as prescribed, if BP remains elevated to call and we can make adjustments as needed but not typically needed for 1 day of elevated pressures.  -Patient wondering if she needs to go to hospital, given symptoms described discussed that there were no red flags and no need to go to urgently though if it would give her piece of mind to be seen by physician she could go but no need to go currently.  To take her medication as prescribed and check BP later today if still elevated call clinic and can make adjustments if needed.    Aida Raider, MD  [facility]  [phone]

## 2016-10-11 ENCOUNTER — Ambulatory Visit
Admission: RE | Admit: 2016-10-11 | Discharge: 2016-10-11 | Disposition: A | Discharge status: Home / Self Care | Payer: Medicare Other | Source: Ambulatory Visit | Attending: Cardiovascular Disease | Admitting: Cardiovascular Disease

## 2016-10-11 DIAGNOSIS — I361 Nonrheumatic tricuspid (valve) insufficiency: Secondary | ICD-10-CM | POA: Insufficient documentation

## 2016-10-11 DIAGNOSIS — I340 Nonrheumatic mitral (valve) insufficiency: Principal | ICD-10-CM | POA: Insufficient documentation

## 2016-10-11 LAB — ECHOCARDIOGRAM COMPLETE
IVSD 2D: 0.91 cm (ref 0.6–0.9)
LEFT INTERNAL DIMENSION IN SYSTOLE: 2.62 cm
LEFT VENTRICULAR INTERNAL DIMENSION IN DIASTOLE: 4.37 cm (ref 3.8–5.2)
LVEF (EST): 65 %
MV E-WAVE VEL/E'TISSUE VEL LAT: 15.916 Decel
MV VALVE AREA P 1/2 METHOD: 3.08 cm2
POSTERIOR WALL: 0.92 cm (ref 0.6–0.9)
TAPSE: 1.35 cm
TV PEAK SYSTOLIC PULMONARY ARTERY PRESSURE: 38.32 mmHg

## 2016-10-12 ENCOUNTER — Telehealth: Payer: Self-pay | Admitting: Cardiovascular Disease

## 2016-10-12 NOTE — Telephone Encounter (Signed)
Forwarding to Dr. Singh for review.    Amiliah Campisi, RN

## 2016-10-12 NOTE — Telephone Encounter (Signed)
Patient would like her echo results °

## 2016-10-15 ENCOUNTER — Ambulatory Visit: Payer: Medicare Other | Attending: Cardiovascular Disease

## 2016-10-15 DIAGNOSIS — Z45018 Encounter for adjustment and management of other part of cardiac pacemaker: Principal | ICD-10-CM | POA: Insufficient documentation

## 2016-10-15 NOTE — Telephone Encounter (Signed)
Patient calling to inquire about echo results again.    Can be reached at [phone]    Thank you,  Suella Broad  [place], Float

## 2016-10-15 NOTE — Telephone Encounter (Signed)
Called patient, left message to call clinic back.    1. We received her first message and have forwarded it to Dr. Candiss Norse for review and recommendations- no recs received yet  2. Patient has appointment next week with Dr. Candiss Norse, he can discuss echo results with her at that time.    Katy Apo RN, BSN

## 2016-10-16 NOTE — Telephone Encounter (Signed)
Patient returning Brittany Stokes's call. Relayed to Patient Brittany Stokes's message below. Patient still requesting a call from [place].    [place] II, Float

## 2016-10-16 NOTE — Telephone Encounter (Signed)
I spoke to her.  SM.    gs

## 2016-10-16 NOTE — Telephone Encounter (Signed)
Spoke to patient, she is anxious about her echo results. She reviewed them in EMR and is worried that her heart is getting worse.   She has an appointment with Dr Candiss Norse on 3/12 but hopes to have a call from him prior just for reassurance.   She can be reached on her cell phone and it's ok to leave a message.    Advised patient that I would forward a message to Dr. Candiss Norse.    Lindie Spruce, RN

## 2016-10-18 ENCOUNTER — Encounter: Payer: Self-pay | Admitting: Cardiovascular Disease

## 2016-10-22 ENCOUNTER — Telehealth: Payer: Self-pay | Admitting: Cardiovascular Disease

## 2016-10-22 ENCOUNTER — Encounter: Payer: Self-pay | Admitting: Cardiovascular Disease

## 2016-10-22 NOTE — Progress Notes (Signed)
Latitude Remote, reviewed on 10/15/16.  Brittany Stokes had a scheduled remote transmission.   Patient has a Engineer, agricultural pacemaker that was implanted by Dr. Davina Poke.    Device function is normal, Lead function is normal  Battery status is good, estimating 2.5 years  Presenting EGM: AFib, Vp  Arrhythmias/Comments:   1 NSVT event. EGM shows AFib with a 2 second run of RVR between 155-190 bpm.  Patient is in AFib 100% of the time and is on Pradaxa, Digoxin and Toprol.    Next scheduled remote is June and office visit will be scheduled soon.   Interrogated by: Antoine Poche, MS, HLT II    [name] MD (HLT II supervising)  Department of Internal Medicine, Division of Cardiology

## 2016-10-22 NOTE — Telephone Encounter (Signed)
Patient had an appointment today that she requested to cancel as she is currently in the hospital. She is requesting a call from Dr. Candiss Norse to discuss her heart and issues. Also wanted to reschedule and next available in May so Patient would like to discuss options regarding coming in sooner if possible.    Thank you,  Brittany Stokes  [place] II, Float

## 2016-10-22 NOTE — Progress Notes (Signed)
Date of Service: 10/15/2016    Following MD: Dr. [name]  Referring MD: Dr. Zenaida Deed    Diagnosis: I49.5 Sick Sinus Syndrome / Brady-Tachy    Device:  [place] [id number] ADVANTIO  Implant date: 10/16/2011  RA Lead: [place] Model 4469 [place] II Sterox EZ  Implant date: 10/16/2011   RV Lead: Cardiac [place] Model 4470 [place] II Sterox EZ  Implant date: 10/16/2011   Underlying rhythm: N/A    Settings:  Mode DDDR  Lower rate 60 bpm  Max tracking rate 130 bpm  Max sensor rate 130 bpm  AV delay 350/350 ms  RA sensing sensitivity 0.2 mV  RV sensing sensitivity 0.6 mV  RA pacing polarity: Bipolar  RV pacing polarity: Bipolar  RA pacing: Amplitude 2.0 V @ Pulse Width 0.4 ms  RV pacing: Amplitude 1.0 V @ Pulse Width 0.4 ms    RA Test:  RA impedance 538 ohms  RA sensing 1.2 mV  RA pacing threshold: Amplitude N/A  @ Pulse Width      RV Test:  RV impedance 438 ohms  RV sensing 19.6 mV  RV pacing threshold: Amplitude 0.6 V @ Pulse Width 0.4 ms    Battery:  Battery status MOS  Battery remaining longevity 30 months  Battery at MOS     Percent Paced:  RA percent paced 0 %  RV percent paced 63 %

## 2016-10-22 NOTE — Telephone Encounter (Signed)
Spoke to patient, she is hospitalized at [facility]. She was taken by ambulance Saturday morning for abdominal pain and diarrhea. While there her HR has been elevated intermittently, as high as 150s.   She is concerned because they know nothing about her cardiac history and thinks she isn't getting the cardiac care she needs. [facility] does not have Epic and cannot see her records. Discussed with patient that increased HR can be the body's normal response to fever, pain, dehydration. Urged her to discuss her concerns with the physician or team caring for her today. Advised that [facility] can request any records they need from [facility]. She would like to reschedule her appointment but next available is in May. I asked her to update [place] once she is out of the hospital and we can work on finding an earlier appointment for her.    Lindie Spruce, RN

## 2016-10-22 NOTE — Progress Notes (Signed)
I have personally reviewed the report and agree with the findings.

## 2016-11-19 ENCOUNTER — Ambulatory Visit (HOSPITAL_BASED_OUTPATIENT_CLINIC_OR_DEPARTMENT_OTHER): Payer: Medicare Other

## 2016-11-19 ENCOUNTER — Ambulatory Visit: Payer: Medicare Other | Attending: Cardiovascular Disease | Admitting: Cardiovascular Disease

## 2016-11-19 ENCOUNTER — Encounter: Payer: Self-pay | Admitting: Cardiovascular Disease

## 2016-11-19 VITALS — BP 136/78 | HR 60 | Resp 18 | Ht 64.0 in | Wt 147.7 lb

## 2016-11-19 DIAGNOSIS — I349 Nonrheumatic mitral valve disorder, unspecified: Principal | ICD-10-CM | POA: Insufficient documentation

## 2016-11-19 DIAGNOSIS — I481 Persistent atrial fibrillation: Secondary | ICD-10-CM | POA: Insufficient documentation

## 2016-11-19 DIAGNOSIS — Z45018 Encounter for adjustment and management of other part of cardiac pacemaker: Secondary | ICD-10-CM | POA: Insufficient documentation

## 2016-11-19 DIAGNOSIS — Z950 Presence of cardiac pacemaker: Secondary | ICD-10-CM | POA: Insufficient documentation

## 2016-11-19 DIAGNOSIS — I10 Essential (primary) hypertension: Secondary | ICD-10-CM | POA: Insufficient documentation

## 2016-11-19 DIAGNOSIS — Z7901 Long term (current) use of anticoagulants: Secondary | ICD-10-CM | POA: Insufficient documentation

## 2016-11-19 MED ORDER — HYDRALAZINE 25 MG TABLET
25.0000 mg | ORAL_TABLET | ORAL | 3 refills | Status: DC
Start: 2016-11-19 — End: 2017-10-07

## 2016-11-19 NOTE — Progress Notes (Signed)
Date of Service: 11/19/2016    Following MD: Dr. [name]  Referring MD: Dr. Zenaida Deed    Diagnosis: I49.5 Sick Sinus Syndrome / Brady-Tachy    Device:  [place] [id number] ADVANTIO  Implant date: 10/16/2011  RA Lead: [place] Model 4469 [place] II Sterox EZ  Implant date: 10/16/2011   RV Lead: Cardiac [place] Model 4470 [place] II Sterox EZ  Implant date: 10/16/2011   Underlying rhythm: Afib at 60-92 bpm    Settings:  Mode DDDR  Lower rate 60 bpm  Max tracking rate 130 bpm  Max sensor rate 130 bpm  AV delay 350.0/350.0 ms  RA sensing sensitivity 0.2 mV  RV sensing sensitivity 0.6 mV  RA pacing polarity: Bipolar  RV pacing polarity: Bipolar  RA pacing: Amplitude 2.0 V @ Pulse Width 0.4 ms  RV pacing: Amplitude 1.0 V @ Pulse Width 0.4 ms    RA Test:  RA impedance 570.0 ohms  RA sensing 3.5 mV    RV Test:  RV impedance 460.0 ohms  RV sensing 20.5 mV  RV pacing threshold: Amplitude 0.5000 V @ Pulse Width 0.4 ms    Battery:  Battery status MOS  Battery remaining longevity 2.5 years   Battery at [employer] I     Percent Paced:  RA percent paced 1 %  RV percent paced 63 %

## 2016-11-19 NOTE — Progress Notes (Signed)
I have personally reviewed the report and agree with the findings.

## 2016-11-19 NOTE — Nursing Note (Addendum)
Vitals were taken, screened for pain, pharmacy and allergies verified. Patient saw [place] [facility].    Karie Fetch MA

## 2016-11-19 NOTE — Progress Notes (Signed)
Brittany Stokes was seen in [facility] clinic today and had requested a device check today.  Patient has Teacher, music.    Device function is normal, Lead function is normal  Battery status is MOL with estimated longevity of 2.5 years.  Underlying rhythm: atrial fibrillation at 60-92 bpm    Arrhythmias/Comments:   111 AMS episode with atrial burden of 100% for 365 days. EGMs showed atrial fibrillation/ atrial flutter.   50 NSVT episode which 3 EGMs showed atrial fibrillation with rapid ventricular response at 160-185 bpm. (this was also when the patient was in the hospital at [facility] )    Patient is on Dabigatran (PRADAXA) and Metoprolol     Glynn Octave MD was notified of results.  Next scheduled office visit is one year and next remote follow up is every 3 months  Interrogated by: Jack Quarto CCT, CRAT, HLT II    [name] MD (HLT II supervising)  Department of Internal Medicine, Division of Cardiology
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
## 2016-11-30 ENCOUNTER — Other Ambulatory Visit: Payer: Self-pay | Admitting: Adult Health

## 2016-11-30 NOTE — Telephone Encounter (Signed)
Chart reviewed. Refilled per [facility] RN refill policy.   Stillman Buenger, RN

## 2017-01-01 ENCOUNTER — Other Ambulatory Visit: Payer: Self-pay | Admitting: Cardiovascular Disease

## 2017-01-01 MED ORDER — LISINOPRIL 20 MG TABLET
20.0000 mg | ORAL_TABLET | Freq: Every day | ORAL | 5 refills | Status: DC
Start: 2017-01-01 — End: 2017-07-18

## 2017-01-01 NOTE — Telephone Encounter (Signed)
Per 11/19/16 visit with Dr. Candiss Norse, patient to continue with lisinopril 20mg  as prescribed.  Requested Prescriptions     Signed Prescriptions Disp Refills    Lisinopril (PRINIVIL, ZESTRIL) 20 mg Tablet 30 tablet 5     Sig: Take 1 tablet by mouth every day.     Authorizing Provider: Zenaida Deed DEEP     Ordering User: Toma Aran, RN

## 2017-01-01 NOTE — Telephone Encounter (Signed)
Refill request received from [place]  for Lisinopril (PRINIVIL, ZESTRIL) 20 mg Tablet      LOV: 04.09.2018  NOV: N/a    Wessie Shanks/MA I

## 2017-02-25 ENCOUNTER — Ambulatory Visit: Payer: Medicare Other | Attending: Cardiovascular Disease

## 2017-02-25 DIAGNOSIS — Z45018 Encounter for adjustment and management of other part of cardiac pacemaker: Principal | ICD-10-CM | POA: Insufficient documentation

## 2017-03-04 NOTE — Progress Notes (Signed)
Latitude  Remote, reviewed on 02/25/17.  A remote transmission was sent on [facility].  Patient has Careers adviser.   This was a scheduled  remote transmission.     Device function is normal, Lead function is normal  Battery status is MOL I, with an estimated longevity of 2 years.   Presenting EGM:  AS/ VS-VP(predominatly VP)    Arrhythmias/Comments:   1 AMS/ATR episode with atrial burden of 100% with the longest of 97 days(since 11/19/2016). EGMs showed atrial fibrillation/ atrial flutter with ventricular rate of 70-75 bpm  No VT/VF episodes     Next scheduled remote is Oct and office visit is April.   Interrogated by: Jack Quarto CCT, CRAT, HLT II    [name] MD, PH.D, F.A.C.C., F.A.S.E.  [place]  Department of Internal Medicine, Division of Cardiology   (HLT II supervising)

## 2017-03-04 NOTE — Progress Notes (Signed)
Date of Service: 02/25/2017    Following MD: Dr. [name]  Referring MD: Dr. Zenaida Deed    Diagnosis: I49.5 Sick Sinus Syndrome / Brady-Tachy    Device:  [place] [id number] ADVANTIO  Implant date: 10/16/2011  RA Lead: [place] Model 4469 [place] II Sterox EZ  Implant date: 10/16/2011   RV Lead: [place] Model 4470 [place] II Sterox EZ  Implant date: 10/16/2011     Settings:  Mode DDDR  Lower rate 60 bpm  Max tracking rate 130 bpm  Max sensor rate 130 bpm  AV delay 350/350 ms  RA sensing sensitivity 0.2 mV  RV sensing sensitivity 0.6 mV  RA pacing polarity: Bipolar  RV pacing polarity: Bipolar  RA pacing: Amplitude 2.0 V @ Pulse Width 0.4 ms  RV pacing: Amplitude 1.0 V @ Pulse Width 0.4 ms    RA Test:  RA impedance 564 ohms  RA sensing 3.3 mV    RV Test:  RV impedance 453 ohms  RV sensing 21.4 mV  RV pacing threshold: Amplitude 0.5 V @ Pulse Width 0.4 ms    Battery:  Battery status BOS  Battery remaining longevity 24 months  Battery at 42%     Percent Paced:  RA percent paced 0 %  RV percent paced 75 %

## 2017-03-07 NOTE — Progress Notes (Signed)
I have personally reviewed the report and agree with the findings.

## 2017-03-19 ENCOUNTER — Encounter: Payer: Self-pay | Admitting: Cardiovascular Disease

## 2017-03-19 NOTE — Progress Notes (Signed)
Contacted patient via MyChart requesting to review medications. -Julian Medina LVN

## 2017-03-21 NOTE — Progress Notes (Signed)
Called patient, patient answered, verified patient identification x2, reconciled medication and appointment with patient. - Hafiz Irion LVN

## 2017-04-01 ENCOUNTER — Telehealth: Payer: Self-pay | Admitting: Cardiovascular Disease

## 2017-04-01 ENCOUNTER — Encounter: Payer: Self-pay | Admitting: Cardiovascular Disease

## 2017-04-01 ENCOUNTER — Ambulatory Visit: Payer: Medicare Other | Attending: Cardiovascular Disease | Admitting: Cardiovascular Disease

## 2017-04-01 VITALS — BP 143/70 | HR 75 | Ht 64.0 in | Wt 145.3 lb

## 2017-04-01 DIAGNOSIS — I159 Secondary hypertension, unspecified: Secondary | ICD-10-CM | POA: Insufficient documentation

## 2017-04-01 DIAGNOSIS — I482 Chronic atrial fibrillation: Secondary | ICD-10-CM | POA: Insufficient documentation

## 2017-04-01 DIAGNOSIS — I340 Nonrheumatic mitral (valve) insufficiency: Secondary | ICD-10-CM | POA: Insufficient documentation

## 2017-04-01 DIAGNOSIS — I10 Essential (primary) hypertension: Secondary | ICD-10-CM | POA: Insufficient documentation

## 2017-04-01 DIAGNOSIS — I119 Hypertensive heart disease without heart failure: Secondary | ICD-10-CM | POA: Insufficient documentation

## 2017-04-01 DIAGNOSIS — I4891 Unspecified atrial fibrillation: Secondary | ICD-10-CM | POA: Insufficient documentation

## 2017-04-01 DIAGNOSIS — Z1329 Encounter for screening for other suspected endocrine disorder: Secondary | ICD-10-CM | POA: Insufficient documentation

## 2017-04-01 DIAGNOSIS — Z950 Presence of cardiac pacemaker: Secondary | ICD-10-CM | POA: Insufficient documentation

## 2017-04-01 DIAGNOSIS — I349 Nonrheumatic mitral valve disorder, unspecified: Principal | ICD-10-CM | POA: Insufficient documentation

## 2017-04-01 NOTE — Telephone Encounter (Signed)
Pt is calling stating that she has not had a echo done yet and she states that she usually has one done before her appt. Her appt is for today and she is not sure what she should do.     She states that she has not seen MD since April.    Please advise and call back     Thank you,  [employer]

## 2017-04-01 NOTE — Nursing Note (Signed)
Vitals signs were taken, screened for pain, pharmacy and allergies verified.  EKG performed.    Essa Wenk, MA

## 2017-04-01 NOTE — Telephone Encounter (Signed)
Last echo 3/18. A repeat echo prior to this appointment was not ordered. Patient instructed to keep appointment.     Lindie Spruce, RN
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
## 2017-04-02 NOTE — Telephone Encounter (Signed)
Patient informed of labs ordered for secondary HTN.    Lindie Spruce, RN

## 2017-04-04 ENCOUNTER — Ambulatory Visit
Admission: RE | Admit: 2017-04-04 | Discharge: 2017-04-04 | Disposition: A | Discharge status: Home / Self Care | Payer: Medicare Other | Source: Ambulatory Visit | Attending: Cardiovascular Disease | Admitting: Cardiovascular Disease

## 2017-04-04 DIAGNOSIS — I159 Secondary hypertension, unspecified: Principal | ICD-10-CM | POA: Insufficient documentation

## 2017-04-04 DIAGNOSIS — I701 Atherosclerosis of renal artery: Secondary | ICD-10-CM

## 2017-04-07 LAB — POC ELECTROCARDIOGRAM WITH RHYTHM STRIP: QTC: 473

## 2017-04-08 ENCOUNTER — Telehealth: Payer: Self-pay | Admitting: Cardiovascular Disease

## 2017-04-08 ENCOUNTER — Ambulatory Visit: Payer: Medicare Other | Attending: Cardiovascular Disease

## 2017-04-08 DIAGNOSIS — Z1329 Encounter for screening for other suspected endocrine disorder: Secondary | ICD-10-CM | POA: Insufficient documentation

## 2017-04-08 DIAGNOSIS — I340 Nonrheumatic mitral (valve) insufficiency: Principal | ICD-10-CM | POA: Insufficient documentation

## 2017-04-08 DIAGNOSIS — I150 Renovascular hypertension: Principal | ICD-10-CM

## 2017-04-08 DIAGNOSIS — I159 Secondary hypertension, unspecified: Secondary | ICD-10-CM | POA: Insufficient documentation

## 2017-04-08 LAB — SED RATE WESTERGREN: SED RATE WESTERGREN: 37 mm/h — AB (ref 0–30)

## 2017-04-08 LAB — THYROXINE, FREE (FREE T4): THYROXINE, FREE (FREE T4): 1.16 ng/dL (ref 0.56–1.64)

## 2017-04-08 LAB — C-REACTIVE PROTEIN: C-REACTIVE PROTEIN (MULTPLIER 0.1): 0.8 mg/dL (ref 0.1–0.8)

## 2017-04-08 LAB — THYROID STIMULATING HORMONE: THYROID STIMULATING HORMONE: 1.08 u[IU]/mL (ref 0.35–3.30)

## 2017-04-08 NOTE — Telephone Encounter (Signed)
Called pt to discuss finding of RAS and issues with controlling her BP.    Will have her see Dr Reece Agar for further discussion and consideration of renal artery stenting.    Zenaida Deed, M.D., [facility], [facility]  Interventional Cardiology  Pgr [phone]

## 2017-04-10 LAB — ALDOSTERONE,BLOOD: ALDOSTERONE,BLOOD: 16.5 ng/dL

## 2017-04-10 LAB — RENIN ACTIVITY: RENIN ACTIVITY: 2.5 ng/mL/h

## 2017-04-12 ENCOUNTER — Ambulatory Visit: Payer: Medicare Other | Attending: Cardiovascular Disease

## 2017-04-12 DIAGNOSIS — I159 Secondary hypertension, unspecified: Principal | ICD-10-CM | POA: Insufficient documentation

## 2017-04-13 LAB — CORTISOL: CORTISOL: 9.9 ug/dL

## 2017-04-17 LAB — METANEPHRINES FRACTIONATED,UR
CREATININE,UR PER 24HR: 462 mg/d (ref 400–1300)
CREATININE,UR PER VOLUME: 42 mg/dL
METANEPHRINE,UR-PER 24HR: 52 ug/d (ref 39–143)
METANEPHRINE,UR-PER VOLUME: 47 ug/L
METANEPHRINE,UR-RATIO TO CRT: 112 ug/g{creat} (ref 0–300)
NORMETANEPHRIN,UR-RATIO TO CRT: 479 ug/g{creat} — AB (ref 0–400)
NORMETANEPHRINE,UR-PER 24HR: 221 ug/d (ref 109–393)
NORMETANEPHRINE,UR-PER VOLUME: 201 ug/L
TIME OF COLLECTION: 24 h
TOTAL VOLUME: 1100 mL

## 2017-04-17 LAB — CATECHOLAMINES,URINE FREE
CREATININE,UR PER 24HR: 418 mg/d (ref 400–1300)
CREATININE,UR PER VOLUME: 38 mg/dL
DOPAMINE,UR PER 24HR: 129 ug/d (ref 56–272)
DOPAMINE,UR PER VOLUME: 117 ug/L
DOPAMINE,UR RATIO TO CRT: 308 ug/g{creat} — AB (ref 0–250)
EPINEPHRINE,UR PER 24 HR: 1 ug/d (ref 1–5)
EPINEPHRINE,UR PER VOLUME: 1 ug/L
EPINEPHRINE,UR RATIO TO CRT: 3 ug/g{creat} (ref 0–20)
NOREPINEPHRINE,UR PER 24HR: 42 ug/d (ref 11–60)
NOREPINEPHRINE,UR PER VOLUME: 38 ug/L
NOREPINEPHRINE,UR RATIO TO CRT: 100 ug/g{creat} — AB (ref 0–45)
TIME OF COLLECTION: 24 h
TOTAL VOLUME: 1100 mL

## 2017-04-17 LAB — METANEPHRINES,PLASMA (FREE)
METANEPHRINE: 0.11 nmol/L (ref 0.00–0.49)
NORMETANEPHRINE: 0.35 nmol/L (ref 0.00–0.89)

## 2017-04-18 ENCOUNTER — Encounter: Payer: Self-pay | Admitting: Vascular Surgery

## 2017-04-18 ENCOUNTER — Ambulatory Visit: Payer: Medicare Other | Attending: Vascular Surgery | Admitting: Vascular Surgery

## 2017-04-18 VITALS — BP 131/78 | HR 76 | Temp 97.4°F | Resp 16 | Wt 144.6 lb

## 2017-04-18 DIAGNOSIS — I701 Atherosclerosis of renal artery: Secondary | ICD-10-CM | POA: Insufficient documentation

## 2017-04-18 DIAGNOSIS — I150 Renovascular hypertension: Principal | ICD-10-CM | POA: Insufficient documentation

## 2017-04-18 NOTE — Progress Notes (Signed)
Vascular Surgery Attending Note  CC: Referral from cardiologit for renovascular HTN    Subjective:   Brittany Stokes comes in today for evaluation of R  renovascular stenosis of 60% found on ultrasound imaging studies. She has a long history of HTN, as well as a fib and mitral regurgitaiton and is followed closely by Dr. Wallace Keller with cardiology. Her blood pressures have become more labile recently, varying with SBPs from 90 to 220s so further workup was initiated to evaluate the etiology of her HTN.     The patient reports recent episodes of hypertensive emergency with associated vision changes and "not feeling well", which sometimes have required ER visits for control. She denies any headaches, SOB, CP, abdominal pain, n/v/d, or fevers/chills. She does note recent weight loss but attributes this to recent changes in her diet.     Past Medical History:   Diagnosis Date    Arthritis     Atrial fibrillation     Balance problem     Cancer     skin cancer    Hypertension     Shellfish allergy 11/22/2015       Past Surgical History:   Procedure Laterality Date    BIOPSY, BREAST      CESAREAN SECTION      x 3    CHOLECYSTECTOMY      EXCISION, PILONIDAL CYST      HYSTERECTOMY      TONSILLECTOMY           Current Outpatient Prescriptions:     Alprazolam (XANAX) 0.25 mg Tablet Tablet, Take by mouth., Disp: , Rfl:     DIGOX 250 mcg Tablet, Take 1 tablet by mouth every day., Disp: 30 tablet, Rfl: 11    HydrALAZINE (APRESOLINE) 25 mg Tablet, Take 1 tablet by mouth As Directed. Indications: hypertension Take one tablet every 6 hours as needed for systolic blood pressure (the top number) > 150., Disp: 90 tablet, Rfl: 3    Lisinopril (PRINIVIL, ZESTRIL) 20 mg Tablet, Take 1 tablet by mouth every day., Disp: 30 tablet, Rfl: 5    Metoprolol Succinate (TOPROL XL) 50 mg SR Tablet, Take 1 tablet by mouth every day., Disp: 30 tablet, Rfl: 11    PRADAXA 150 mg Capsule, Take 150 mg by mouth 2 times daily.        , Disp: ,  Rfl:     PredniSONE (DELTASONE) 1 mg Tablet, Take 2 mg by mouth every morning after a meal.        , Disp: , Rfl:     PREMARIN 0.625 mg Tablet, Take 0.625 mg by mouth every day.        , Disp: , Rfl:     SYNTHROID 125 mcg Tablet, Take 125 mcg by mouth every morning before a meal.        , Disp: , Rfl:       Social History     Social History    Marital status: MARRIED     Spouse name: Brittany Stokes    Number of children: Brittany Stokes    Years of education: Brittany Stokes     Occupational History    Not on file.     Social History Main Topics    Smoking status: Former Smoker     Packs/day: 0.50     Types: Cigarettes     Quit date: 04/19/1967    Smokeless tobacco: Never Used    Alcohol use No    Drug use: No  Sexual activity: Not on file     Other Topics Concern    Not on file     Social History Narrative    Patient is married, 2 children. She is currently retired and used to be a Pharmacist, hospital.        Family History   Problem Relation Age of Onset    heart condition [OTHER] Mother     Sclerosis [OTHER] Father     lung disease [OTHER] Brother          Ciprofloxacin    Unknown-Explain in Comments  Epinephrine    Tachycardia  Erythromycin    Brittany Stokes  Shellfish Containing Products    Angioedema  Tetracycline    Unknown-Explain in Comments      Review of Systems   Constitutional: Negative for activity change, appetite change and fever.   HENT: Negative for congestion.    Eyes: Negative for discharge.   Respiratory: Negative for shortness of breath.    Cardiovascular: Positive for palpitations. Negative for chest pain.   Gastrointestinal: Positive for nausea. Negative for abdominal pain, constipation and diarrhea.   Endocrine: Negative for cold intolerance and heat intolerance.   Musculoskeletal: Positive for gait problem.   Skin: Negative for color change.   Neurological: Negative for syncope, speech difficulty and headaches.   Psychiatric/Behavioral: Negative for agitation.        Temp: 36.3 C (97.4 F) (09/06 1539)  Temp src: Tympanic (09/06  1539)  Pulse: 76 (09/06 1544)  BP: 131/78 (09/06 1544)  Resp: 16 (09/06 1544)  SpO2: --  Height: --  Weight: 65.6 kg (144 lb 10 oz) (09/06 1539)     Physical Exam   Constitutional: She is oriented to person, place, and time. She appears well-developed and well-nourished.   HENT:   Head: Normocephalic and atraumatic.   Eyes: EOM are normal.   Neck: Normal range of motion. Neck supple.   Cardiovascular: Normal rate.    Irregularly irregular   Musculoskeletal: Normal range of motion. She exhibits no edema.   Lymphadenopathy:     She has no cervical adenopathy.   Neurological: She is alert and oriented to person, place, and time.   Skin: Skin is warm and dry.         Imaging:   Renal Artery Duplex 04/04/2017:   1. THERE ARE TWO RIGHT RENAL ARTERIES, BOTH WITH GREATER THAN 60% STENOSIS. THE RIGHT RENAL VEIN IS PATENT.  2. THERE ARE TWO LEFT RENAL ARTERIES, BOTH WITH NO EVIDENCE OF STENOSIS. THE LEFT RENAL VEIN IS   PATENT.  3. THE KIDNEYS ARE OF NORMAL AND SYMMETRIC LENGTH.    A/P:   Patient is an 83 year old woman with an extensive cardiac history including years of HTN, recently with persistently high blood pressure, occasionally with SBP as high as 230. Renal artery ultrasound demonstrated R renal artery stenosis of 60%. Given the mild velocities demonstrated on duplex studies, as well as the patients resistance to surgical intervention, no vascular intervention is recommended at this time. We recommend continued close follow up with cardiology for further HTN management.     The patient was seen and evaluated and a plan devised with the attending physician Dr. Reece Agar.     [name], MD  General Surgery PGY-1  PI # [id number]  Vascular Surgery, service pager [phone]        Vascular Surgery Attending Note    This patient was seen and evaluated with Dr. Jenness Corner in clinic. We developed  a plan together and discussed this with the patient.     I also personally reviewed her duplex ultrasound with her today.  We discussed  that given the mild elevation in the velocities within her renal arteries I would not recommend any treatment at this time.  We discussed that renal artery interventions are reserved for patients with a solitary kidney or those with flash pulmonary edema, worsening creatinine, or severe ostial stenosis.  She does not take any of these criteria.    Given that the stenosis is only right at 60% I think she will do fine without any further treatment.  I also reviewed her metanephrine studies and she does not appear to have a pheochromocytoma.  It is unclear why she has such labile blood pressure, but I discussed with her that at this time there would not be a vascular intervention she needs.    I also do not feel strongly about following her renal artery stenosis again given how mildly elevated her velocities are.  If she were to have a worsening creatinine or develop any other concerning symptoms that might indicate a renal origin, we could reconsider our treatment plan.  She does not need to follow-up with me.    This note was created using the support of [employer]. Please note any grammatical, sound-alike, or syntax errors as likely dictation errors.     Sheila Oats, MD  Division of Vascular Surgery  [facility]  [address] [place]  [place], [place]  Phone [phone]  Pager [phone]

## 2017-04-18 NOTE — Nursing Note (Signed)
Vital signs taken, allergies verified, screened for pain,  Joury Allcorn, MA

## 2017-04-30 ENCOUNTER — Telehealth: Payer: Self-pay | Admitting: Cardiovascular Disease

## 2017-04-30 NOTE — Telephone Encounter (Signed)
Discussed with Dr. Candiss Norse, he will review echo, in the meantime, hold Lisinopril for SBP <100, if SBP >100, take Lisinopril 10mg  daily (half of current dose). Patient to come in for BP check and BP machine calibration.     Spoke to patient, reviewed MD recommendation. She verbalized understanding. She declined nurse visit at this time, she will see her PCP for machine calibration. I asked her to call back if she wasn't able to do this or if she still had concerns about BP. Patient verbalized understanding.    Lindie Spruce, RN    .

## 2017-04-30 NOTE — Telephone Encounter (Signed)
Patient c/o low BP, 90s/50-60s x 2 weeks. Has been feeling fatigued so checking BP more frequently.  She is concerned because BP has never been consistently this low.   She has been taking Toprol 50mg  daily but sometimes hold Lisinopril 40mg  or takes half a pills when BP too low.   Echo is scheduled for tomorrow.  Requests MD review and recommendation regarding BP and fatigue.     Lindie Spruce, RN

## 2017-04-30 NOTE — Telephone Encounter (Signed)
Patient states his BP on the low side, under 100.  She's been very tired lately.  She states this is a new symptom for her.

## 2017-05-01 ENCOUNTER — Ambulatory Visit
Admission: RE | Admit: 2017-05-01 | Discharge: 2017-05-01 | Disposition: A | Discharge status: Home / Self Care | Payer: Medicare Other | Source: Ambulatory Visit | Attending: Cardiovascular Disease | Admitting: Cardiovascular Disease

## 2017-05-01 DIAGNOSIS — I340 Nonrheumatic mitral (valve) insufficiency: Principal | ICD-10-CM | POA: Insufficient documentation

## 2017-05-01 DIAGNOSIS — I361 Nonrheumatic tricuspid (valve) insufficiency: Secondary | ICD-10-CM | POA: Insufficient documentation

## 2017-05-01 LAB — ECHOCARDIOGRAM COMPLETE
IVSD 2D: 1.05 cm (ref 0.6–0.9)
LEFT INTERNAL DIMENSION IN SYSTOLE: 3.02 cm
LEFT VENTRICULAR INTERNAL DIMENSION IN DIASTOLE: 4.81 cm (ref 3.8–5.2)
LVEF (EST): 65 %
POSTERIOR WALL: 1.06 cm (ref 0.6–0.9)
RV D(2D): 2.38 cm (ref 1.9–?)
TAPSE: 1.6 cm
TV PEAK SYSTOLIC PULMONARY ARTERY PRESSURE: 36.18 mmHg

## 2017-05-03 ENCOUNTER — Other Ambulatory Visit: Payer: Self-pay

## 2017-05-03 ENCOUNTER — Telehealth: Payer: Self-pay | Admitting: Cardiovascular Disease

## 2017-05-03 NOTE — Telephone Encounter (Signed)
Notes Recorded by Sidney Ace, MD on 05/02/2017 at 6:54 AM  Hi Brittany Stokes,    Can you pls let her know that the MR is overall about the same.    gs    Patient informed, she had no questions.    Brittany Spruce, RN

## 2017-05-03 NOTE — Telephone Encounter (Signed)
Patient is calling to get results to her echo.      Thank You,  Brittany Stokes

## 2017-05-16 ENCOUNTER — Ambulatory Visit: Payer: Medicare Other | Admitting: Vascular Surgery

## 2017-05-27 ENCOUNTER — Ambulatory Visit: Payer: Medicare Other | Attending: Cardiovascular Disease

## 2017-05-27 DIAGNOSIS — Z45018 Encounter for adjustment and management of other part of cardiac pacemaker: Principal | ICD-10-CM | POA: Insufficient documentation

## 2017-06-03 ENCOUNTER — Other Ambulatory Visit: Payer: Self-pay | Admitting: Cardiovascular Disease

## 2017-06-03 DIAGNOSIS — I340 Nonrheumatic mitral (valve) insufficiency: Principal | ICD-10-CM

## 2017-06-03 NOTE — Progress Notes (Signed)
Received medical clearance form from [place], to be completed and signed by Dr. Candiss Norse.  Discussed with him.  Will order lab work, since last bmp was 07/30/16, and last documented CBC was 06/22/02.      Called and spoke with patient.  Identity verified.  She said that her oral procedure is on hold for now, since she recently developed Shingles, and had to go to [facility] due to vomiting/side effects of Acyclovir.    I told her I will request records from them, as they most likely did a cbc and bmp.  I told her we will keep the clearance form for now, until she is ready to move forward with her procedure.  She was agreeable.

## 2017-06-07 ENCOUNTER — Telehealth: Payer: Self-pay | Admitting: Cardiovascular Disease

## 2017-06-07 NOTE — Telephone Encounter (Signed)
Called and spoke with patient, regarding our recent telephone conversation about her getting lab tests prior to her oral surgery.  (See Orders only note dated 06/03/17).      I told her that I ordered the labs requested by her oral surgeon, and when she is ready to proceed, she can get them done at whatever [facility] lab she normally goes to.  She verbalized understanding and was in agreement.      She then told me that she is still feeling poorly, related to her shingles and Acyclovir.  I had to clarify with her who diagnosed her.  She said she thought she had a spider bite and sent to the ED because she was feeling poorly. They diagnosed her and prescribed the medication.  She has since followed up with her pcp office, and was advised to lower her dose of Acyclovir, but she states she feels worse.  I advised that she call them back to see if there is anything else she can do or take.  She was in agreement.      I will keep the oral surgeons form in Dr. Keturah Barre file until patient is ready to proceed with the procedure.  Daiva Eves, RN, Cardiology    Correction from Orders Only note:  Patient went to [facility], not [facility].  Will fax request to [phone], as given to me by [place].

## 2017-06-12 ENCOUNTER — Telehealth: Payer: Self-pay | Admitting: Cardiovascular Disease

## 2017-06-12 NOTE — Telephone Encounter (Signed)
[place] calling stating that she is going to fax over clearance for patient.    Please give her a call back    Thank you,  [employer]

## 2017-06-13 NOTE — Telephone Encounter (Signed)
Clearance forms located in Dr. Ledell Noss folder. Notified [place] that we received forms.    Melvyn Neth RN, BSN

## 2017-06-18 ENCOUNTER — Telehealth: Payer: Self-pay | Admitting: Cardiovascular Disease

## 2017-06-18 NOTE — Telephone Encounter (Signed)
Patient has shingles and they want to prescribe Gabapentin.  She wants to make sure its ok to take with her heart medication.

## 2017-06-18 NOTE — Telephone Encounter (Signed)
Gabapentin ok per Dr. Candiss Norse. Patient informed.     Lindie Spruce, RN

## 2017-06-18 NOTE — Telephone Encounter (Signed)
Yes

## 2017-07-03 NOTE — Progress Notes (Signed)
Date of Service: 05/27/2017    Following MD: Dr. [name]  Implanting MD: Dr. Aileen Fass  Referring MD: Dr. Zenaida Deed    Diagnosis: I49.5 Sick Sinus Syndrome / Brady-Tachy    Device:  [id number]-[id number] [phone] ADVANTIO  Implant date: 10/16/2011  RA Lead: Bon[employer]el Model 4469 [place] II Sterox EZ  Implant date: 10/16/2011   RV Lead: Freedom Model 4470 [place] II Sterox EZ  Implant date: 10/16/2011     Settings:  Mode DDDR  Lower rate 60 bpm  Max tracking rate 130 bpm  Max sensor rate 130 bpm  AV delay 350/350 ms  RA sensing sensitivity 0.2 mV  RV sensing sensitivity 0.6 mV  RA pacing polarity: Bipolar  RV pacing polarity: Bipolar  RA Sense polarity: Bipolar  RV Sense polarity: Bipolar  RA pacing: Amplitude 2.0 V @ Pulse Width 0.4 ms  RV pacing: Amplitude 1.0 V @ Pulse Width 0.4 ms    RA Test:  RA impedance 604 ohms  RA pacing threshold: Amplitude 0.5 V @ Pulse Width 0.4 ms    RV Test:  RV impedance 489 ohms  RV pacing threshold: Amplitude 0.5 V @ Pulse Width 0.4 ms    Battery:  Battery status BOS  Battery remaining longevity 24 months    Percent Paced:  RA percent paced 0 %  RV percent paced 78 %

## 2017-07-03 NOTE — Progress Notes (Signed)
Lat[employer] Remote, reviewed on 05/27/17.  Brittany Stokes had a scheduled remote transmission.   Patient has a Engineer, site [phone] dual chamber pacemaker. Dr Zenaida Deed is her Primary Cardiologist.    Device function is normal, Lead function is normal  Battery status is good, estimating 2 years  Presenting EGM: AFib/ Vp and Vs.    Arrhythmias/Comments:   No VHR events    Afib 100% of he time, she is on Pradaxa, Digoxin and Toprol.     Next scheduled remote is in January and office visit is yearly in April.   Interrogated by: Rulon Abide HLT II    [name] MD, PH.D, F.A.C.C., F.A.S.E.  [place]  Department of Internal Medicine, Division of Cardiology

## 2017-07-06 NOTE — Progress Notes (Signed)
I have personally reviewed the report and agree with the findings.

## 2017-07-12 ENCOUNTER — Encounter: Payer: Self-pay | Admitting: Cardiovascular Disease

## 2017-07-12 NOTE — Progress Notes (Signed)
Medications Last Reviewed by Sidney Ace, MD on 04/02/2017.  PVP Chart Review done. Contacted patient via MyChart requesting to review medications and upcoming appointment with Dr. Candiss Norse. Leanor Kail LVN

## 2017-07-18 ENCOUNTER — Other Ambulatory Visit: Payer: Self-pay | Admitting: Cardiovascular Disease

## 2017-07-18 NOTE — Telephone Encounter (Signed)
Chart reviewed. Patient seen in clinic within the last year. Medication refilled per prescription refill by clinic RN standardized protocol.    Charmaine Placido RN, BSN  Cardiology Triage

## 2017-07-19 ENCOUNTER — Ambulatory Visit: Payer: Medicare Other | Attending: Cardiovascular Disease | Admitting: Cardiovascular Disease

## 2017-07-19 ENCOUNTER — Encounter: Payer: Self-pay | Admitting: Cardiovascular Disease

## 2017-07-19 VITALS — BP 162/80 | HR 63 | Ht 64.0 in | Wt 143.1 lb

## 2017-07-19 DIAGNOSIS — I10 Essential (primary) hypertension: Secondary | ICD-10-CM | POA: Insufficient documentation

## 2017-07-19 DIAGNOSIS — I4819 Other persistent atrial fibrillation: Secondary | ICD-10-CM

## 2017-07-19 DIAGNOSIS — I482 Chronic atrial fibrillation: Secondary | ICD-10-CM | POA: Insufficient documentation

## 2017-07-19 DIAGNOSIS — I4891 Unspecified atrial fibrillation: Secondary | ICD-10-CM | POA: Insufficient documentation

## 2017-07-19 DIAGNOSIS — Z950 Presence of cardiac pacemaker: Secondary | ICD-10-CM | POA: Insufficient documentation

## 2017-07-19 DIAGNOSIS — I349 Nonrheumatic mitral valve disorder, unspecified: Principal | ICD-10-CM | POA: Insufficient documentation

## 2017-07-19 DIAGNOSIS — R9431 Abnormal electrocardiogram [ECG] [EKG]: Secondary | ICD-10-CM | POA: Insufficient documentation

## 2017-07-19 MED ORDER — DIGOXIN 125 MCG (0.125 MG) TABLET
0.1250 mg | ORAL_TABLET | Freq: Every day | ORAL | 3 refills | Status: DC
Start: 2017-07-19 — End: 2018-06-24

## 2017-07-19 MED ORDER — AMLODIPINE 2.5 MG TABLET
2.5000 mg | ORAL_TABLET | Freq: Every day | ORAL | 3 refills | Status: DC
Start: 2017-07-19 — End: 2017-07-29

## 2017-07-19 NOTE — Nursing Note (Signed)
Vitals were taken, screened for pain, pharmacy and allergies verified. EKG performed.    Tymir Terral, MA
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
## 2017-07-22 ENCOUNTER — Encounter: Payer: Self-pay | Admitting: Cardiovascular Disease

## 2017-07-22 LAB — POC ELECTROCARDIOGRAM WITH RHYTHM STRIP: QTC: 387

## 2017-07-29 ENCOUNTER — Telehealth: Payer: Self-pay | Admitting: Cardiovascular Disease

## 2017-07-29 MED ORDER — AMLODIPINE 5 MG TABLET
5.0000 mg | ORAL_TABLET | Freq: Every day | ORAL | 12 refills | Status: DC
Start: 2017-07-29 — End: 2017-10-07

## 2017-07-29 NOTE — Telephone Encounter (Signed)
Relayed MD recommendation below. Patient says she cannot tolerate hydralazine because it makes her heart race. She will increase Amlodipine to 5mg  daily. I asked her to keep a log and call back later this week if BP remains high. She verbalized understanding.    Lindie Spruce, RN

## 2017-07-29 NOTE — Telephone Encounter (Signed)
Patient is calling stating that she would like to talk to the nurse in regards to her BP and BP medication. States that her BP medication is not working and would like advise before she leaves out of town. States she has not taken BP this morning, but BP last night was 170/95. Please call patient back.    Thank you,  Rae Mar II, Cardiology Clinic
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
## 2017-07-29 NOTE — Telephone Encounter (Signed)
Sounds good.  Increase norvasc to 5mg  po daily    And then would use her hydralazine prn.  I think she has a prescription for this.    Keep track of SBPs and would have to add on third agent if BP continues to be elevated.    gs

## 2017-07-30 NOTE — Telephone Encounter (Signed)
Spoke to patient, after increase of Amlodipine to 5mg , BP improved this morning 125/72.  While out running errands, she felt that BP was high but was unable to check it.  Once back home, recheck on BP 120s/70s.   Patient does have mild edema to hands and feet. No other symptoms.   Advised that this could be from increasing dose of amlodipine. She will monitor BP and swelling for the next 2 days and call Thursday afternoon if it persists. She is leaving town on Friday and would like to have BP under control by then.    Lindie Spruce, RN

## 2017-07-30 NOTE — Telephone Encounter (Signed)
Patient is calling stating that she would like to talk to [place], [place] in regards to her BP. States that she is still having high blood pressure. States that she can't take BP right now since they are not at home. Please call patient back.    Thank you,  Rae Mar II, Cardiology Clinic

## 2017-07-30 NOTE — Telephone Encounter (Signed)
Called patient, mailbox full. Will call again.    Lindie Spruce, RN

## 2017-08-28 ENCOUNTER — Ambulatory Visit: Payer: Medicare Other | Attending: Cardiovascular Disease

## 2017-08-28 DIAGNOSIS — Z45018 Encounter for adjustment and management of other part of cardiac pacemaker: Principal | ICD-10-CM | POA: Insufficient documentation

## 2017-09-13 NOTE — Telephone Encounter (Signed)
Encounter closed for administrative reasons.

## 2017-09-14 ENCOUNTER — Other Ambulatory Visit: Payer: Self-pay | Admitting: Cardiovascular Disease

## 2017-09-16 NOTE — Telephone Encounter (Signed)
Chart reviewed; Medication refilled per [place] RN standardized procedure.  Tiyon Sanor, RN

## 2017-09-17 ENCOUNTER — Telehealth: Payer: Self-pay | Admitting: Cardiovascular Disease

## 2017-09-17 NOTE — Telephone Encounter (Signed)
Agree. Thanks

## 2017-09-17 NOTE — Telephone Encounter (Signed)
Patient states that she is having leg swelling, and feels little more tired. She is getting over shingles.
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
## 2017-09-17 NOTE — Telephone Encounter (Signed)
Discussed with Dr. Candiss Norse, plan for nurse visit 2/11 when he is in clinic. Patient to bring all medication bottles.   Called patient, VM is full. Will route to triage for f/u tomorrow.     Lindie Spruce, RN

## 2017-09-18 NOTE — Telephone Encounter (Signed)
Spoke to pt. Scheduled appt for 2/11 at 1045am. Pt will bring all medications with her.

## 2017-10-04 NOTE — Progress Notes (Signed)
I have personally reviewed the report and agree with the findings.

## 2017-10-04 NOTE — Progress Notes (Signed)
Date of Service: 08/28/2017    Following MD: Dr. [name]  Implanting MD: Dr. Aileen Fass  Referring MD: Dr. Zenaida Deed    Diagnosis: I49.5 Sick Sinus Syndrome / Brady-Tachy    Device:  Louise [id number] ADVANTIO  Implant date: 10/16/2011  RA Lead: [place] Model 4469 [place] II Sterox EZ  Implant date: 10/16/2011   RV Lead: [place] Model 4470 [place] II Sterox EZ  Implant date: 10/16/2011     Settings:  Mode DDDR  Lower rate 60 bpm  Max tracking rate 130 bpm  Max sensor rate 130 bpm  AV delay 350/350 ms  RA sensing sensitivity 0.2 mV  RV sensing sensitivity 0.6 mV  RA pacing polarity: Bipolar  RV pacing polarity: Bipolar  RA Sense polarity: Bipolar  RV Sense polarity: Bipolar  RA pacing: Amplitude 2.0 V @ Pulse Width 0.4 ms  RV pacing: Amplitude 1.1 V @ Pulse Width 0.4 ms    RA Test:  RA impedance 526 ohms  RA pacing threshold: Amplitude 0.5 V @ Pulse Width 0.4 ms    RV Test:  RV impedance 442 ohms  RV pacing threshold: Amplitude 0.6 V @ Pulse Width 0.4 ms    Battery:  Battery status MOS  Battery remaining longevity 18 months    Percent Paced:  RA percent paced 0 %  RV percent paced 77 %

## 2017-10-04 NOTE — Progress Notes (Signed)
Latitude Remote, reviewed on 08/28/17  Brittany Stokes had a scheduled remote transmission.   Patient has a Engineer, site [phone] dual chamber pacemaker. Dr Zenaida Deed is her Primary Cardiologist.    Device function is normal, Lead function is normal  Battery status is good, estimating 1.5 years  Presenting EGM: AFib / Vp and Vs.    Arrhythmias/Comments:   3 NSVT episodes (all on 08/18/17) - EGMs show Atrial fibrillation w/ RVR. Longest lasting 13 sec at 169-182bpm.    Afib 100% of he time, she is on Pradaxa, Digoxin and Toprol.     Next scheduled remote is every 91 days and office visit is yearly in April.   Interrogated by: Zenovia Jordan, HLT II    [name] MD, PH.D, F.A.C.C., F.A.S.E.  [place]  Department of Internal Medicine, Division of Cardiology      Current Outpatient Medications on File Prior to Visit   Medication Sig Dispense Refill    Alprazolam (XANAX) 0.25 mg Tablet Tablet Take by mouth.      Amlodipine (NORVASC) 5 mg Tablet Take 1 tablet by mouth every day. 30 tablet 12    Digoxin (LANOXIN) 125 mcg Tablet Take 1 tablet by mouth every day. 90 tablet 3    HydrALAZINE (APRESOLINE) 25 mg Tablet Take 1 tablet by mouth As Directed. Indications: hypertension Take one tablet every 6 hours as needed for systolic blood pressure (the top number) > 150. 90 tablet 3    Lisinopril (PRINIVIL, ZESTRIL) 20 mg Tablet TAKE ONE TABLET BY MOUTH ONE TIME DAILY  90 tablet 3    PRADAXA 150 mg Capsule Take 150 mg by mouth 2 times daily.                  SYNTHROID 125 mcg Tablet Take 125 mcg by mouth every morning before a meal.                   No current facility-administered medications on file prior to visit.

## 2017-10-07 ENCOUNTER — Ambulatory Visit: Payer: Medicare Other | Attending: Cardiovascular Disease

## 2017-10-07 VITALS — BP 163/87 | HR 76 | Ht 64.0 in | Wt 144.4 lb

## 2017-10-07 DIAGNOSIS — I10 Essential (primary) hypertension: Secondary | ICD-10-CM | POA: Insufficient documentation

## 2017-10-07 DIAGNOSIS — I340 Nonrheumatic mitral (valve) insufficiency: Principal | ICD-10-CM | POA: Insufficient documentation

## 2017-10-07 NOTE — Nursing Note (Signed)
Patient verified by name and date of birth.   Vital signs assessed, allergies verified, screened for pain and verified pharmacy.   By Tyaire Odem Vega-Mendoza, MA II

## 2017-10-07 NOTE — Patient Instructions (Signed)
1. Schedule echo for March  2. See Dr. Candiss Norse in April  3. Do not take clonidine  4. Consider amlodipine or hydralazine for blood pressure control

## 2017-10-08 NOTE — Progress Notes (Signed)
S: Pt unsure of the reason for the appointment; she thought she was supposed to see Dr. Candiss Norse. Pt was contacted to come to clinic d/t reports of swelling and elevated blood pressure and for medication review. She had her [place] fax over a medication report. She says that her PCP has given her an Rx for clonidine which she takes sparingly. She says she does not take amlodipine or hydralazine because they don't work for her. She is worried about the ankle swelling and her mitral valve regurgitation. She is also anxious because her husband is hospitalized at an OSH currently.    O:    BP 163/87 (SITE: left arm, Orthostatic Position: sitting, Cuff Size: regular)   Pulse 76   Ht 1.626 m (5\' 4" )   Wt 65.5 kg (144 lb 6.4 oz)   SpO2 100%   BMI 24.79 kg/m     GENERAL: NAD  HEART: irregular. No murmurs  LUNGS: Clear bilaterally, no rales/wheezes/rhonchi  ABDOMEN: soft and nontender.   EXT: 1+ bilateral ankle edema    Lab Results   Lab Name Value Date/Time    NA 135 07/30/2016 09:58 AM    NA 139 11/09/2015 11:30 AM    K 4.3 07/30/2016 09:58 AM    K 4.0 11/09/2015 11:30 AM    CL 103 07/30/2016 09:58 AM    CL 106 11/09/2015 11:30 AM    CO2 26 07/30/2016 09:58 AM    CO2 24 11/09/2015 11:30 AM    BUN 9 07/30/2016 09:58 AM    BUN 11 11/09/2015 11:30 AM    CR 0.44 07/30/2016 09:58 AM    CR 0.45 11/09/2015 11:30 AM    GLU 120 (H) 07/30/2016 09:58 AM    GLU 86 11/09/2015 11:30 AM       Lab Results   Lab Name Value Date/Time    WBC 6.7 06/22/2002 10:03 AM    HGB 13.0 06/22/2002 10:03 AM    HCT 37.9 06/22/2002 10:03 AM    PLT 331 06/22/2002 10:03 AM       Outpatient Medications Marked as Taking for the 10/07/17 encounter (Nursing/Ancillary Staff) with NURSE GENCAR   Medication Sig Dispense Refill    CloNIDine (CATAPRES) 0.1 mg Tablet       Conjugated Estrogens (PREMARIN) 0.625 mg Tablet Take 0.625 mg by mouth every day.      Digoxin (LANOXIN) 125 mcg Tablet Take 1 tablet by mouth every day. 90 tablet 3    Lisinopril  (PRINIVIL, ZESTRIL) 20 mg Tablet TAKE ONE TABLET BY MOUTH ONE TIME DAILY  90 tablet 3    Metoprolol Succinate (TOPROL XL) 50 mg SR Tablet TAKE ONE TABLET BY MOUTH ONE TIME DAILY  90 tablet 3    PRADAXA 150 mg Capsule Take 150 mg by mouth 2 times daily.                  PredniSONE (DELTASONE) 1 mg Tablet Take 1 mg by mouth every morning after a meal.      SYNTHROID 125 mcg Tablet Take 125 mcg by mouth every morning before a meal.                     A: Pt is hypertensive but refusing to take amlodipine    P: D/w Dr. Candiss Norse who recommends the following:  1. Echo  2. F/u in one month  3. Consider amlodipine  4. No clonidine    The patient indicates understanding of these issues  and agrees with the plan.

## 2017-10-15 ENCOUNTER — Encounter: Payer: Self-pay | Admitting: Cardiovascular Disease

## 2017-10-17 ENCOUNTER — Emergency Department (EMERGENCY_DEPARTMENT_HOSPITAL): Payer: Medicare Other

## 2017-10-17 ENCOUNTER — Observation Stay
Admission: EM | Admit: 2017-10-17 | Discharge: 2017-10-18 | DRG: 307 | Disposition: A | Discharge status: Home / Self Care | Payer: Medicare Other | Attending: Cardiovascular Disease | Admitting: Cardiovascular Disease

## 2017-10-17 DIAGNOSIS — Z7952 Long term (current) use of systemic steroids: Secondary | ICD-10-CM

## 2017-10-17 DIAGNOSIS — Z950 Presence of cardiac pacemaker: Secondary | ICD-10-CM | POA: Insufficient documentation

## 2017-10-17 DIAGNOSIS — R0989 Other specified symptoms and signs involving the circulatory and respiratory systems: Secondary | ICD-10-CM

## 2017-10-17 DIAGNOSIS — G4452 New daily persistent headache (NDPH): Secondary | ICD-10-CM

## 2017-10-17 DIAGNOSIS — I340 Nonrheumatic mitral (valve) insufficiency: Principal | ICD-10-CM | POA: Insufficient documentation

## 2017-10-17 DIAGNOSIS — M1990 Unspecified osteoarthritis, unspecified site: Secondary | ICD-10-CM | POA: Insufficient documentation

## 2017-10-17 DIAGNOSIS — I482 Chronic atrial fibrillation: Secondary | ICD-10-CM | POA: Insufficient documentation

## 2017-10-17 DIAGNOSIS — Z7901 Long term (current) use of anticoagulants: Secondary | ICD-10-CM | POA: Insufficient documentation

## 2017-10-17 DIAGNOSIS — Z8551 Personal history of malignant neoplasm of bladder: Secondary | ICD-10-CM

## 2017-10-17 DIAGNOSIS — K5730 Diverticulosis of large intestine without perforation or abscess without bleeding: Secondary | ICD-10-CM

## 2017-10-17 DIAGNOSIS — R1032 Left lower quadrant pain: Secondary | ICD-10-CM

## 2017-10-17 DIAGNOSIS — G7241 Inclusion body myositis [IBM]: Secondary | ICD-10-CM | POA: Insufficient documentation

## 2017-10-17 DIAGNOSIS — I10 Essential (primary) hypertension: Secondary | ICD-10-CM | POA: Insufficient documentation

## 2017-10-17 DIAGNOSIS — Z85828 Personal history of other malignant neoplasm of skin: Secondary | ICD-10-CM | POA: Insufficient documentation

## 2017-10-17 DIAGNOSIS — R7989 Other specified abnormal findings of blood chemistry: Secondary | ICD-10-CM

## 2017-10-17 DIAGNOSIS — R9431 Abnormal electrocardiogram [ECG] [EKG]: Secondary | ICD-10-CM

## 2017-10-17 DIAGNOSIS — E039 Hypothyroidism, unspecified: Secondary | ICD-10-CM

## 2017-10-17 DIAGNOSIS — I160 Hypertensive urgency: Secondary | ICD-10-CM

## 2017-10-17 DIAGNOSIS — I4891 Unspecified atrial fibrillation: Secondary | ICD-10-CM

## 2017-10-17 DIAGNOSIS — R51 Headache: Secondary | ICD-10-CM

## 2017-10-17 DIAGNOSIS — I495 Sick sinus syndrome: Secondary | ICD-10-CM

## 2017-10-17 DIAGNOSIS — Z87891 Personal history of nicotine dependence: Secondary | ICD-10-CM | POA: Insufficient documentation

## 2017-10-17 DIAGNOSIS — M353 Polymyalgia rheumatica: Secondary | ICD-10-CM | POA: Insufficient documentation

## 2017-10-17 LAB — HEPATIC FUNCTION PANEL
ALANINE TRANSFERASE (ALT): 40 U/L (ref 5–54)
ALBUMIN: 3.5 g/dL (ref 3.2–4.6)
ALKALINE PHOSPHATASE (ALP): 57 U/L (ref 35–115)
ASPARTATE TRANSAMINASE (AST): 44 U/L — AB (ref 15–43)
BILIRUBIN DIRECT: 0.2 mg/dL (ref 0.0–0.2)
BILIRUBIN TOTAL: 0.9 mg/dL (ref 0.3–1.3)
PROTEIN: 7.3 g/dL (ref 6.3–8.3)

## 2017-10-17 LAB — CBC WITH DIFFERENTIAL
BASOPHILS % AUTO: 0.8 %
Basophils Abs Auto: 0.1 10*3/uL (ref 0.0–0.2)
EOSINOPHIL % AUTO: 0.6 %
EOSINOPHIL ABS AUTO: 0 10*3/uL (ref 0.0–0.5)
HEMATOCRIT: 39.9 % (ref 36.0–46.0)
HEMOGLOBIN: 13.4 g/dL (ref 12.0–16.0)
LYMPHOCYTE ABS AUTO: 2.7 10*3/uL (ref 1.0–4.8)
LYMPHOCYTES % AUTO: 41.3 %
MCH: 31.9 pg (ref 27.0–33.0)
MCHC: 33.7 % (ref 32.0–36.0)
MCV: 94.7 fL (ref 80.0–100.0)
MONOCYTES % AUTO: 9.9 %
MONOCYTES ABS AUTO: 0.7 10*3/uL (ref 0.1–0.8)
MPV: 8.6 fL (ref 6.8–10.0)
NEUTROPHIL ABS AUTO: 3.1 10*3/uL (ref 1.8–7.7)
NEUTROPHILS % AUTO: 47.4 %
PLATELET COUNT: 248 10*3/uL (ref 130–400)
RDW: 12.5 % (ref 0.0–14.7)
RED CELL COUNT: 4.21 10*6/uL (ref 4.00–5.20)
WHITE BLOOD CELL COUNT: 6.6 10*3/uL (ref 4.5–11.0)

## 2017-10-17 LAB — URINALYSIS-COMPLETE
BILIRUBIN URINE: NEGATIVE
GLUCOSE URINE: NEGATIVE mg/dL
KETONES: NEGATIVE mg/dL
LEUK. ESTERASE: NEGATIVE
NITRITE URINE: NEGATIVE
OCCULT BLOOD URINE: NEGATIVE mg/dL
PH URINE: 7 (ref 4.8–7.8)
PROTEIN URINE: NEGATIVE mg/dL
SPECIFIC GRAVITY, URINE: 1.005 (ref 1.002–1.030)
UROBILINOGEN.: NEGATIVE mg/dL (ref ?–2.0)

## 2017-10-17 LAB — BASIC METABOLIC PANEL
CALCIUM: 9.5 mg/dL (ref 8.6–10.5)
CARBON DIOXIDE TOTAL: 24 mmol/L (ref 24–32)
CHLORIDE: 104 mmol/L (ref 95–110)
CREATININE BLOOD: 0.42 mg/dL — AB (ref 0.44–1.27)
Glucose: 96 mg/dL (ref 70–99)
POTASSIUM: 4.1 mmol/L (ref 3.3–5.0)
SODIUM: 137 mmol/L (ref 135–145)
Urea Nitrogen, Blood (BUN): 8 mg/dL (ref 8–22)

## 2017-10-17 LAB — MAGNESIUM (MG): MAGNESIUM (MG): 1.9 mg/dL (ref 1.5–2.6)

## 2017-10-17 LAB — TSH WITH FREE T4 REFLEX: THYROID STIMULATING HORMONE: 0.99 u[IU]/mL (ref 0.35–3.30)

## 2017-10-17 LAB — LIPASE: LIPASE: 33 U/L (ref 13–51)

## 2017-10-17 LAB — INR
INR: 1.05 (ref 0.87–1.18)
PROTHROMBIN TIME: 10.4 s (ref 8.0–11.9)

## 2017-10-17 LAB — SED RATE WESTERGREN: SED RATE WESTERGREN: 58 mm/h — AB (ref 0–30)

## 2017-10-17 LAB — B-TYPE NATRIURETIC PEPTIDE: B-TYPE NATRIURETIC PEPTIDE: 218 pg/mL — AB (ref 1–100)

## 2017-10-17 LAB — CREATINE KINASE: CREATINE KINASE: 296 U/L — AB (ref 0–250)

## 2017-10-17 LAB — TROPONIN T
TROPONINT: 175 ng/L — AB (ref <=19)
TROPONINT: 164 ng/L — AB (ref <=19)

## 2017-10-17 LAB — C-REACTIVE PROTEIN: C-REACTIVE PROTEIN (MULTPLIER 0.1): 0.8 mg/dL (ref 0.1–0.8)

## 2017-10-17 MED ORDER — ACETAMINOPHEN 325 MG TABLET
650.0000 mg | ORAL_TABLET | Freq: Three times a day (TID) | ORAL | Status: DC | PRN
Start: 2017-10-17 — End: 2017-10-18

## 2017-10-17 MED ORDER — IOHEXOL 350 MG IODINE/ML INTRAVENOUS SOLUTION
125.0000 mL | INTRAVENOUS | Status: AC
Start: 2017-10-17 — End: 2017-10-17
  Administered 2017-10-17: 125 mL via INTRAVENOUS

## 2017-10-17 MED ORDER — PROCHLORPERAZINE EDISYLATE 10 MG/2 ML (5 MG/ML) INJECTION SOLUTION
10.0000 mg | Freq: Once | INTRAMUSCULAR | Status: DC
Start: 2017-10-17 — End: 2017-10-17
  Filled 2017-10-17: qty 2

## 2017-10-17 MED ORDER — DABIGATRAN ETEXILATE 150 MG CAPSULE
150.0000 mg | ORAL_CAPSULE | Freq: Two times a day (BID) | ORAL | Status: DC
Start: 2017-10-17 — End: 2017-10-18
  Administered 2017-10-17 – 2017-10-18 (×2): 150 mg via ORAL
  Filled 2017-10-17 (×2): qty 1

## 2017-10-17 MED ORDER — LEVOTHYROXINE 125 MCG TABLET
125.0000 ug | ORAL_TABLET | Freq: Every day | ORAL | Status: DC
Start: 2017-10-18 — End: 2017-10-18
  Administered 2017-10-18: 125 ug via ORAL
  Filled 2017-10-17: qty 1

## 2017-10-17 MED ORDER — METOPROLOL SUCCINATE ER 50 MG TABLET,EXTENDED RELEASE 24 HR
50.0000 mg | EXTENDED_RELEASE_TABLET | Freq: Every day | ORAL | Status: DC
Start: 2017-10-17 — End: 2017-10-18
  Administered 2017-10-17 – 2017-10-18 (×2): 50 mg via ORAL
  Filled 2017-10-17 (×2): qty 1

## 2017-10-17 MED ORDER — ACETAMINOPHEN 500 MG TABLET
1000.0000 mg | ORAL_TABLET | Freq: Once | ORAL | Status: AC
Start: 2017-10-17 — End: 2017-10-17
  Administered 2017-10-17: 500 mg via ORAL
  Filled 2017-10-17: qty 2

## 2017-10-17 MED ORDER — LACTATED RINGERS IV BOLUS - DURATION REQ
1.0000 L | Freq: Once | INTRAVENOUS | Status: AC
Start: 2017-10-17 — End: 2017-10-17
  Administered 2017-10-17: 1000 mL via INTRAVENOUS

## 2017-10-17 MED ORDER — POLYETHYLENE GLYCOL 3350 17 GRAM ORAL POWDER PACKET
17.0000 g | Freq: Every day | ORAL | Status: DC
Start: 2017-10-18 — End: 2017-10-18
  Filled 2017-10-17: qty 1

## 2017-10-17 MED ORDER — ASPIRIN 325 MG TABLET
325.0000 mg | ORAL_TABLET | Freq: Once | ORAL | Status: AC
Start: 2017-10-17 — End: 2017-10-17
  Administered 2017-10-17: 325 mg via ORAL
  Filled 2017-10-17: qty 1

## 2017-10-17 MED ORDER — NIFEDIPINE 10 MG CAPSULE
10.0000 mg | ORAL_CAPSULE | Freq: Three times a day (TID) | ORAL | Status: DC
Start: 2017-10-17 — End: 2017-10-17

## 2017-10-17 MED ORDER — CLONIDINE HCL 0.1 MG TABLET
0.1000 mg | ORAL_TABLET | Freq: Once | ORAL | Status: DC
Start: 2017-10-17 — End: 2017-10-17
  Filled 2017-10-17: qty 1

## 2017-10-17 MED ORDER — BISACODYL 10 MG RECTAL SUPPOSITORY
10.0000 mg | RECTAL | Status: DC | PRN
Start: 2017-10-17 — End: 2017-10-18

## 2017-10-17 MED ORDER — DIGOXIN 125 MCG (0.125 MG) TABLET
0.1250 mg | ORAL_TABLET | Freq: Every day | ORAL | Status: DC
Start: 2017-10-18 — End: 2017-10-18
  Administered 2017-10-18: 0.125 mg via ORAL
  Filled 2017-10-17: qty 1

## 2017-10-17 MED ORDER — PREDNISONE 1 MG TABLET
1.0000 mg | ORAL_TABLET | Freq: Every day | ORAL | Status: DC
Start: 2017-10-18 — End: 2017-10-18
  Administered 2017-10-18: 1 mg via ORAL
  Filled 2017-10-17: qty 1

## 2017-10-17 MED ORDER — LISINOPRIL 20 MG TABLET
20.0000 mg | ORAL_TABLET | Freq: Two times a day (BID) | ORAL | Status: DC
Start: 2017-10-17 — End: 2017-10-18
  Administered 2017-10-17 – 2017-10-18 (×2): 20 mg via ORAL
  Filled 2017-10-17 (×2): qty 1

## 2017-10-17 NOTE — ED Nursing Note (Signed)
Report back to rn, tx care

## 2017-10-17 NOTE — Clinical Case Management (Signed)
Clinical Case Management Assessments    Name: Brittany Stokes  MRN: [id number]   Date of Birth: 18-Dec-1932 (60yr) Gender: female    Note Date: 10/17/2017 Note Time: 18:13       INITIAL ASSESSMENT NOTE    Patient able to participate in plan?: Yes        Developmental Level Appropriate (Pediatrics): N/A   Living arrangements: Spouse/Significant other   Type of Residence: private residence  Support system: Children  Primary support person: Nikeshia Keetch  - Son   Primary contact phone number: [phone]       Pre-Hospital Services: None       DME in place: Manual wheelchair, Front wheel walker, 3 in 1 commode       Does the patient have ongoing [place] needs?: Yes     Permanent Address: [place] [address]  Discharge Address:  TBA  Pre-Hospitalization mobility: Independent           Patient can follow-up with:   PCP: [name], MD  / Phone Number: [phone]  Preferred Pharmacy: [facility] [place], [place], [phone] [place]    Funding/Billing: Payor: MEDICARE / Plan: MEDICARE PART A&B / Product Type: *No Product type* /    Secondary Insurance: AARP    Comments: Patient lives with her spouse and daughter . She is independent but ambulates using a walker. Anticipated discharge plan is to discharge to home with no needs depending on the hospital course.        Date/Time: 10/17/2017 18:13  Electronically Signed by:   Aliene Altes  Pager: [phone]

## 2017-10-17 NOTE — ED Triage Note (Signed)
Pt a/o, ambulatory to triage with c/o neck pain.  Pt reports started yesterday, worse today. Denies fevers. Denies trauma, falls.  Pt reports ongoing cough, productive, clear.  Diarrhea x 4 days.  Pt reports has been under a lot of stress-husband in SNF.  Lungs cta, abdomen soft, mild swelling noted to bilat feet.  Pt hypertensive in triage.  Reports took all am meds as prescribed.  Reports new prescription of amlodipine that she has not yet started taking.  Skin p/w/d.
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
## 2017-10-17 NOTE — ED Provider Notes (Addendum)
EMERGENCY DEPARTMENT PHYSICIAN NOTE - Brittany Stokes       Date of Service:   10/17/2017  8:45 AM Patient's PCP: [name]   Note Started: 10/17/2017 11:52 DOB: 02-14-1933         Chief Complaint   Patient presents with    Neck Pain       The history provided by the patient, relative and medical records.  Interpreter used: No    Brittany Stokes is a 84yr old female, who  has a past medical history of Arthritis, Atrial fibrillation ([place]), Balance problem, Cancer ([place]), Hypertension, and Shellfish allergy., presenting to the ED with a chief complaint of weakness, rectal bleeding, and headache which started approximately 5-7 days ago.  She states that her #1 complaint is generalized weakness and fatigue.  She states that she has this at baseline but it has gotten significantly worse over the last week.      Has noticed some severe diarrhea and diffuse, crampy, LLQ, abdominal pain without radiation.  Mild rectal bleeding, especially with wiping.  No rectal pain.  No history of GI bleeds.  No history of hemorrhoids.      No chest pain or shortness of breath.  Mild cough.  Also states that she is having some neck pain and posterior headache.  No change to her vision.  No focal weakness or numbness.  No change to her vision.  No change in her mental status per family members.  States that she has been compliant with her hypertension medications but may have missed a dose yesterday.  Did take her hypertension meds this morning.  Has had globally poor p.o. intake for the last 3-5 days.     Per chart review has severe mitral regurg for which she is being followed by Dr. Candiss Norse of cardiology.  She also has polymyalgia rheumatica with inclusion body myositis for which she supposed be on prednisone but has not given no follow-up since her rheumatologist retired.     A full history, including past medical, social, and family history (as detailed in this note), was reviewed and updated as necessary.      HISTORY:  Past Medical  History    Arthritis    Atrial fibrillation (HCC)    Balance problem    Cancer (HCC)    Hypertension    Shellfish allergy    Allergies   Allergen Reactions    Ciprofloxacin Unknown-Explain in Comments    Epinephrine Tachycardia    Erythromycin N/A    Shellfish Containing Products Angioedema    Tetracycline Unknown-Explain in Comments      Past Surgical History:  No date: Biopsy, breast  No date: Cesarean section  No date: Cholecystectomy  No date: Excision, pilonidal cyst  No date: Hysterectomy  No date: Tonsillectomy   Current Outpatient Medications:     Alprazolam (XANAX) 0.25 mg Tablet Tablet, Take by mouth.    CloNIDine (CATAPRES) 0.1 mg Tablet,       Conjugated Estrogens (PREMARIN) 0.625 mg Tablet, Take 0.625 mg by mouth every day.    Digoxin (LANOXIN) 125 mcg Tablet, Take 1 tablet by mouth every day.    Lisinopril (PRINIVIL, ZESTRIL) 20 mg Tablet, TAKE ONE TABLET BY MOUTH ONE TIME DAILY     Metoprolol Succinate (TOPROL XL) 50 mg SR Tablet, TAKE ONE TABLET BY MOUTH ONE TIME DAILY     PRADAXA 150 mg Capsule, Take 150 mg by mouth 2 times daily.  PredniSONE (DELTASONE) 1 mg Tablet, Take 1 mg by mouth every morning after a meal.    SYNTHROID 125 mcg Tablet, Take 125 mcg by mouth every morning before a meal.           Social History     Tobacco Use    Smoking status: Former Smoker     Packs/day: 0.50     Types: Cigarettes     Last attempt to quit: 04/19/1967     Years since quitting: 50.5    Smokeless tobacco: Never Used   Substance Use Topics    Alcohol use: No    Drug use: No     Social History     Social History Narrative    Patient is married, 2 children. She is currently retired and used to be a Pharmacist, hospital.     Family History   Problem Relation Age of Onset    Other (heart condition) Mother     Other (Sclerosis) Father     Other (lung disease) Brother            Review of Systems   Constitutional: Positive for fatigue. Negative for chills and fever.   HENT: Negative for  congestion, rhinorrhea and sore throat.    Eyes: Negative for visual disturbance.   Respiratory: Positive for cough. Negative for chest tightness, shortness of breath and wheezing.    Cardiovascular: Negative for chest pain and palpitations.   Gastrointestinal: Positive for abdominal pain, anal bleeding, blood in stool, diarrhea and nausea. Negative for vomiting.   Genitourinary: Negative for dysuria, frequency and urgency.   Musculoskeletal: Negative for back pain and neck pain.   Skin: Negative for rash and wound.   Neurological: Positive for headaches. Negative for syncope, weakness, light-headedness and numbness.   All other systems reviewed and are negative.         TRIAGE VITAL SIGNS:  Temp: 36.4 C (97.5 F) (10/17/17 0845)  Temp src: Oral (10/17/17 0845)  Pulse: 79 (10/17/17 0845)  BP: (!) 204/94 (10/17/17 0845)  Resp: 20 (10/17/17 0845)  SpO2: 99 % (10/17/17 0845)  Weight: 62.8 kg (138 lb 7.2 oz) (10/17/17 0845)    Physical Exam   Constitutional: She is oriented to person, place, and time. She appears well-developed and well-nourished. No distress.   Well-appearing elderly female, lying on ED stretcher, no acute distress, GCS 15, pleasant interact with provider.   HENT:   Head: Normocephalic and atraumatic.   Right Ear: External ear normal.   Left Ear: External ear normal.   Nose: Nose normal.   Mouth/Throat: Oropharynx is clear and moist.   Eyes: Pupils are equal, round, and reactive to light. Conjunctivae and EOM are normal.   Neck: Normal range of motion. Neck supple.   Mild discomfort with head rotation.  No midline C-spine tenderness.   Cardiovascular: Normal rate, normal heart sounds and intact distal pulses. Exam reveals no gallop and no friction rub.   No murmur heard.  Irregularly irregular rhythm.   Pulmonary/Chest: Effort normal and breath sounds normal. No stridor. No respiratory distress. She has no wheezes. She has no rales. She exhibits no tenderness.   Abdominal: Soft. Bowel sounds are  normal. She exhibits no distension. There is tenderness. There is no rebound and no guarding.   Left lower quadrant abdominal pain.   Genitourinary: Rectal exam shows guaiac negative stool.   Musculoskeletal: Normal range of motion. She exhibits no tenderness or deformity.   Neurological: She is alert and oriented to person,  place, and time. No cranial nerve deficit or sensory deficit. Coordination normal.   Skin: Skin is warm and dry. No rash noted. She is not diaphoretic.   Psychiatric: She has a normal mood and affect. Her behavior is normal. Judgment and thought content normal.   Nursing note and vitals reviewed.       INITIAL ASSESSMENT & PLAN, MEDICAL DECISION MAKING, ED COURSE  Brittany Stokes is a 4yr female who presents with a chief complaint of fatigue, diarrhea, neck pain, and headache.     Differential includes, but is not limited to: N STEMI, electrolyte abnormality, diverticulitis, intra-abdominal infection, sepsis, intracranial hemorrhage, migraine, anxiety, rheumatologic condition, medication noncompliance, hypertensive urgency, hypertensive emergency     The results of the ED evaluation were notable for the following:     Pertinent lab results:   Labs Reviewed   BASIC METABOLIC PANEL - Abnormal       Result Value    Sodium 137      Potassium 4.1      Chloride 104      Carbon Dioxide Total 24      Urea Nitrogen, Blood (BUN) 8      Glucose 96      Calcium 9.5      Creatinine Serum 0.42 (*)     E-GFR, African American >60      E-GFR, Non-African American >60     HEPATIC FUNCTION PANEL - Abnormal    Protein 7.3      Alkaline Phosphatase (ALP) 57      Aspartate Transaminase (AST) 44 (*)     Bilirubin Total 0.9      Alanine Transferase (ALT) 40      Bilirubin Direct 0.2      Albumin 3.5     TROPONIN T - Abnormal    TROPONIN T 175 (*)     Narrative:     [facility] uses an Production manager for [employer] (IFCC) compliant high sensitivity cardiac troponin T assay with a cut off  corresponding to the 99th percentile of the reference population with <10% CV (imprecision). This cutoff for the high sensitivity cardiac troponin T assay is 19 ng/L.    High sensitivity cardiac troponin results should never be used with any contemporary troponin assays, and troponin I should never be used interchangeably with troponin T.    The Third Universal Definition of Myocardial Infarction from the Joint ESC/ACCF/AHA/WHF Task Force for the Redefinition of Myocardial Infarction (Circulation 2012; 126; 2020-2035) states that a typical rise and fall of Troponin above the 99th percentile and 10% CV cut-off with at least one of the following as suggestive of myocardial injury: (a) Ischemic symptoms, (b) ECG changes indicative of ischemia, (c) Imaging evidence of a new regional wall-motion abnormality, and/or (d) Identification of an intracoronary thrombus by angiography or autopsy.    New troponin units of measurements were implemented May 14, 2016. To convert from old units of measurement for troponin (ng/mL) to new units (ng/L), multiply old units by 1000 (e.g., [old units] ng/mL x 1000 = [new units] ng/L).    B-TYPE NATRIURETIC PEPTIDE - Abnormal    B-TYPE NATRIURETIC PEPTIDE 218 (*)     Narrative:     Suggestive of congestive heart failure; clinical correlation necessary.   SED RATE WESTERGREN - Abnormal    SED Rate Westergren 58 (*)    TROPONIN T - Abnormal    TROPONIN T 164 (*)     Narrative:  [facility] uses an Production manager for [employer]) compliant high sensitivity cardiac troponin T assay with a cut off corresponding to the 99th percentile of the reference population with <10% CV (imprecision). This cutoff for the high sensitivity cardiac troponin T assay is 19 ng/L.    High sensitivity cardiac troponin results should never be used with any contemporary troponin assays, and troponin I should never be used interchangeably with troponin T.    The Third Universal  Definition of Myocardial Infarction from the Joint ESC/ACCF/AHA/WHF Task Force for the Redefinition of Myocardial Infarction (Circulation 2012; 126; 2020-2035) states that a typical rise and fall of Troponin above the 99th percentile and 10% CV cut-off with at least one of the following as suggestive of myocardial injury: (a) Ischemic symptoms, (b) ECG changes indicative of ischemia, (c) Imaging evidence of a new regional wall-motion abnormality, and/or (d) Identification of an intracoronary thrombus by angiography or autopsy.    New troponin units of measurements were implemented May 14, 2016. To convert from old units of measurement for troponin (ng/mL) to new units (ng/L), multiply old units by 1000 (e.g., [old units] ng/mL x 1000 = [new units] ng/L).    LIPASE - Normal    Lipase 33     INR - Normal    INR 1.05      Prothrombin Time 10.4     TSH WITH FREE T4 REFLEX - Normal    Thyroid Stimulating Hormone 0.99     C-REACTIVE PROTEIN - Normal    C-Reactive Protein 0.8      Narrative:     TEST INTERPRETATION:    Primarily useful in measuring acute non-specific response to tissue injury or inflammation or chronic inflammation. Units of measure are mg/dL. Correlations have been demonstrated for postoperative recovery, infectious diseases, clinical evaluation of stress, trauma,infection, inflammation, and surgery.  NOT for cardiovascular risk evaluation!    Reference Range:  0.1-0.8 mg/dL     CBC WITH DIFFERENTIAL    White Blood Cell Count 6.6      Red Blood Cell Count 4.21      Hemoglobin 13.4      Hematocrit 39.9      MCV 94.7      MCH 31.9      MCHC 33.7      RDW 12.5      MPV 8.6      Platelet Count 248      Neutrophils % Auto 47.4      Lymphocytes % Auto 41.3      Monocytes % Auto 9.9      Eosinophil % Auto 0.6      Basophils % Auto 0.8      Neutrophil Abs Auto 3.1      Lymphocyte Abs Auto 2.7      Monocytes Abs Auto 0.7      Eosinophil Abs Auto 0.0      Basophils Abs Auto 0.1     URINALYSIS-COMPLETE     COLLECTION CLEAN CATCH      COLOR Yellow      CLARITY Clear      SPECIFIC GRAVITY, URINE 1.005      pH URINE 7.0      OCCULT BLOOD URINE Negative      BILIRUBIN URINE Negative      KETONES Negative      GLUCOSE URINE Negative      PROTEIN URINE Negative      UROBILINOGEN Negative  NITRITE URINE Negative      LEUK ESTERASE Negative      MICROSCOPIC  Not Indicated         Pertinent imaging results (reviewed and interpreted independently by me):   Head CT: no ICH    Radiology reads:   Ct Head Without Contrast    Result Date: 10/17/2017  CT HEAD WITHOUT CONTRAST EXAM DATE: 10/17/2017 2:55 PM COMPARISON: None INDICATION: Headache, new daily persistent; Posterior headache, severe HTN; TECHNIQUE: 2.5 mm axial images from skull base through vertex with sagittal and coronal reformatted images. DOSE REPORT: This study involved (1) CT acquisition(s).  The CTDIvol and DLP values are included below as required by state law: 1; Series: 2; Head; 16 cm; CTDIvol=66.2 mGy;  DLP 1242 mGy-cm For further information on CT radiation dose, see [email or web address] FINDINGS: Brain: No evidence of hemorrhage, mass, shift, or extra axial fluid collection. Few scattered foci of decreased attenuation throughout the hemispheric white matter, likely moderate sequela of chronic small vessel ischemic disease The gray-white matter differentiation is maintained. Ventricular and sulcal prominence consistent with mild generalized brain parenchymal volume loss, within normal limits for age. Bones and orbits: Minimal paranasal sinus mucosal thickening. Soft tissues: Normal. Paranasal sinuses and mastoid air cells: Clear. IMPRESSION: 1. No acute intracranial hemorrhage or mass effect. Preliminary Report Electronically Signed By: Theodoro Kalata on 10/17/2017 3:08 PM I have personally reviewed the images of this study and agree with the above report. Final Report Electronically Signed By: Mariana Arn, M.D. on 10/17/2017  4:00 PM    EKG (reviewed and interpreted independently by me): Atrial fibrillation, rate 75, 2 PVCs, nonspecific ST segment changes, unchanged from priors  ED Medication Administration through 10/17/2017 1624     Date/Time Order Dose Route Action    10/17/2017 1245 Lactated Ringers Bolus 1,000 mL 1,000 mL IV New bag/syringe    10/17/2017 1238 Acetaminophen (TYLENOL) Tablet 1,000 mg 500 mg ORAL Given    10/17/2017 1419 Aspirin Tablet 325 mg 325 mg ORAL Given    10/17/2017 1456 Iohexol (OMNIPAQUE) 350 mg/mL Injection 125 mL 125 mL IV Given        Chart Review: I reviewed the patient's prior medical records. Pertinent information that is relevant to this encounter         PATIENT SUMMARY  Brittany Stokes is an 84 year old female presented to the emergency department with multiple complaints.  ABC stable.  Vitals upon arrival were significant for significant hypertension.  On physical exam, the patient had mild left lower quadrant abdominal tenderness.      Given her complaints of possible rectal bleeding guaiac test was performed and was negative.  Given her normal hemoglobin, reassuring for no acute GI bleed at this time.    Given her history of atrial fibrillation, severe mitral regurg, and now headache, concerning for possible intracranial source of her symptoms.  A CT scan was obtained and showed no acute intracranial hemorrhage.    Has been poorly compliant with her rheumatology medications as an outpatient given lack of follow-up, inflammatory markers were obtained and were mildly elevated.    Her most significant lab abnormality was a elevated troponin which down trended on her second troponin.  No acute signs of ischemia on her EKG however she does have nonspecific ST segment changes across the lateral leads and multiple PVCs in the setting of her known A. fib.  Is a high risk for possible cardiac cause of her symptoms given her previous pacemaker and known A. fib.  Given  that the patient was on clonidine as an  outpatient offered to control her blood pressure with clonidine but the patient refused.  Her blood pressure went down without intervention, so unlikely to have hypertensive emergency as her primary underlying cause of her symptoms. Patient was given aspirin for her underlying possible NSTEMI.    At the time of signout to the oncoming service the patient's disposition was pending reads of abdominal CT scan.  If her CT scan is negative anticipate cardiology consult/admission.       LAST VITAL SIGNS:  Temp: 36.7 C (98.1 F) (10/17/17 1500)  Temp src: Oral (10/17/17 1500)  Pulse: 69 (10/17/17 1500)  BP: 169/84 (10/17/17 1500)  Resp: 18 (10/17/17 1500)  SpO2: 98 % (10/17/17 1500)  Weight: 62.8 kg (138 lb 7.2 oz) (10/17/17 0845)    Clinical Impression: Hypertension, headache, elevated troponin, atrial fibrillation, abdominal pain    Disposition: Pending    PATIENT'S GENERAL CONDITION:  Serious: Vital signs may be unstable and not within normal limits. Patient is acutely ill. Indicators are questionable.    Electronically signed by: Peyton Najjar, MD, Resident         This patient was seen, evaluated, and care plan was developed with the resident.  I agree with the findings and plan as outlined in our combined note.    Verlene Mayer, MD      Electronically signed by: Verlene Mayer, MD, Attending Physician

## 2017-10-17 NOTE — ED Nursing Note (Signed)
Cardiology at bedside.

## 2017-10-17 NOTE — ED Nursing Note (Signed)
Patient transported to CT 

## 2017-10-17 NOTE — ED Nursing Note (Signed)
Report to Vanessa, RN.

## 2017-10-17 NOTE — ED Nursing Note (Signed)
Assumed care of pt for 1 hour lunch break. Md b/s with [place]. Will continue to montior

## 2017-10-17 NOTE — ED Nursing Note (Signed)
Assumed care of pt.  Awake and alert, skin pwd.   Complaints of increased bp, HA and neck pain.  BP improved since arrival, refuses bp meds/compazine.  Dr. Loletta Specter at bedside for eval

## 2017-10-17 NOTE — ED Nursing Note (Signed)
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 Patient presents in awake state, A/O x 4 and speaking in slightly slurred speech pattern which granddaughter states as baseline. Facial symmetry present and tongue midline. Moves all extremities equally to command. Currently denies chest pain, shortness of breath, nausea, or back pain, but is acknowledging neck pain 6/10 and described as new onset which has been preventing her from sleeping. Initial vital signs show Ventricular paced rhythm HR 59 172/80 and 99% room air. See nursing flowsheet for assessment information.

## 2017-10-17 NOTE — H&P (Addendum)
CARDIOLOGY ADMISSION HISTORY & PHYSICAL    DATE OF ADMISSION:  10/17/2017   Primary Care: [name], Phone: [phone]  Cardiologist: Dr. Sidney Ace      CODE STATUS: Full    CC: Neck Pain  SYMPTOMS:  dizziness  RISK FACTORS:  hypertension    HPI:   Brittany Stokes is a 75yrfemale with hx of severe MR, HTN, a-fib, sick sinus syndrome with current pacemaker presenting with L posterior neck pain and general complaints of fatigue and malaise. These symptoms have been present x 1 week, but she has had generalized fatigue x 2-3 weeks. She has had diarrhea and diffuse abdominal pain, and has noted BRBPR only on the toilet paper. She denies gross blood, and has no hx of hemorrhoids. She reports swelling in her feet R>L and denies any recent weight-gain, orthopnea, PND. Has a pacemaker that was interrogated on 08/28/2017. The patient does note that she has been fighting shingles x 3 months and her husband has been hospitalized recently.    The patient has had viral symptoms of productive cough of white sputum and congestion x 2 weeks and believes that she has a respiratory infection. She denies any chest pain or shortness of breath, no visual changes or any fever or chills. The patient is reportedly compliant with her hypertensive regimens, but previously has been given PO hydralazine, which she has historically refused to take because she states that it doesn't work, she also has previously been been prescribed clonidine which she intermittently took until 2/26 when she was prescribed amlodipine. The patient has not picked up her amlodipine until today, on chart review it appears that the patient was prescribed this in December 2018, but stopped this due to swelling in hands and feet.    Patient has a hx of PMR with inclusion body myositis and is currently reportedly on '1mg'$  prednisone daily. Reports no follow-up with rheumatology since her previous doctor retired.       ED Course:  Vitals: T36.4, HR 79, RR 20, BP  204/94  Patient presented with vague complaints including diarrhea, generalized fatigue and malaise. Guaiac negative.CT head negative, elevated ESR and CRP. She had an initially elevated Troponin at 175 which down-trended to 164, no acute ST ischemic changes. The patient was hypertensive, and ED attempted to give the patient clonodine for symptoms but the patient refused. BP improved without intervention. Given 1L LR bolus    EKG 10/17/17  Atrial fibrillation with V-paced complexes rate 75, nonspecific T wave inversions in inferior leads.     CXR 10/17/17  IMPRESSION:  1. Mild pulmonary congestion    Trop:  175 -->164  CBC: unremarkable  BMP: unremarkable  BNP:  218    Admitted to CCU due to hypertension    PRIOR CARDIOVASCULAR PROCEDURES  1. S/p dcPPM for sick sinus syndrome    Echo 05/01/2018  SUMMARY:  1. The LV systolic function is normal. The estimated LV ejection fraction is 65 %.  2. Borderline concentric left ventricular hypertrophy.  3. Mild aortic valve stenosis, mean gradient 6 mmHg, calculated AVA 1.4 sq cm.  4. The left ventricular diastolic function is indeterminate.  5. The left atrium is mildly dilated by LA volume.  6. Mildly dilated right atrium.  7. 2-3+ regurgitation of the mitral valve. MR appears slightly less severe than seen on prior echo   10/11/2016  8. 2+ regurgitation of the tricuspid valve.  9. Pacing wire/catheter visualized in the right atrium and Pacing wire/catheter visualized  in the   right ventricle.    ROS: All systems were reviewed and were negative except as mentioned in HPI.    MEDICAL HISTORY:  Past Medical History:   Diagnosis Date    Arthritis     Atrial fibrillation (HCC)     Balance problem     Cancer ([place])     skin cancer    Hypertension     Shellfish allergy 11/22/2015       SURGICAL HISTORY:  Past Surgical History:   Procedure Laterality Date    BIOPSY, BREAST      CESAREAN SECTION      x 3    CHOLECYSTECTOMY      EXCISION, PILONIDAL CYST       HYSTERECTOMY      TONSILLECTOMY       FAMILY HISTORY:  Family History   Problem Relation Name Age of Onset    Other (heart condition) Mother      Other (Sclerosis) Father      Other (lung disease) Brother       SOCIAL HISTORY  Social History     Socioeconomic History    Marital status: MARRIED     Spouse name: Not on file    Number of children: Not on file    Years of education: Not on file    Highest education level: Not on file   Occupational History    Not on file   Social Needs    Financial resource strain: Not on file    Food insecurity:     Worry: Not on file     Inability: Not on file    Transportation needs:     Medical: Not on file     Non-medical: Not on file   Tobacco Use    Smoking status: Former Smoker     Packs/day: 0.50     Types: Cigarettes     Last attempt to quit: 04/19/1967     Years since quitting: 50.5    Smokeless tobacco: Never Used   Substance and Sexual Activity    Alcohol use: No    Drug use: No    Sexual activity: Not on file   Lifestyle    Physical activity:     Days per week: Not on file     Minutes per session: Not on file    Stress: Not on file   Relationships    Social connections:     Talks on phone: Not on file     Gets together: Not on file     Attends religious service: Not on file     Active member of club or organization: Not on file     Attends meetings of clubs or organizations: Not on file     Relationship status: Not on file    Intimate partner violence:     Fear of current or ex partner: Not on file     Emotionally abused: Not on file     Physically abused: Not on file     Forced sexual activity: Not on file   Other Topics Concern    Not on file   Social History Narrative    Patient is married, 2 children. She is currently retired and used to be a Pharmacist, hospital.      RESIDENCE: Lives at home with husband  EtOH: none  TOBACCO: hx o smoking quit in 2725D  ILLICITS: none      ALLERGIES:   Ciprofloxacin  Unknown-Explain in Comments  Epinephrine     Tachycardia  Erythromycin    N/A  Shellfish Containing Products    Angioedema  Tetracycline    Unknown-Explain in Comments    MEDS PRIOR TO ADMISSION    (Not in a hospital admission)    CURRENT DAILY MEDS  Dabigatran (PRADAXA) Capsule 150 mg, ORAL, BID  [START ON 10/18/2017] Digoxin (LANOXIN) Tablet 0.125 mg, ORAL, Daily 1300  [START ON 10/18/2017] LevoTHYROxine Tablet 125 mcg, ORAL, QAM AC  Lisinopril (PRINIVIL, ZESTRIL) Tablet 20 mg, ORAL, BID  Metoprolol Succinate (TOPROL XL) SR Tablet 50 mg, ORAL, QAM  NIFEdipine (ADALAT, PROCARDIA) Capsule 10 mg, ORAL, TID  [START ON 10/18/2017] Polyethylene Glycol 3350 (MIRALAX) Oral Powder Packet 17 g, ORAL, QAM  [START ON 10/18/2017] PredniSONE (DELTASONE) Tablet 1 mg, ORAL, QAM w/ meal      CURRENT MED DRIPS     CURRENT PRN MEDS       ADMISSION VITALS  VITAL SIGNS (Last Recorded) Average, Max, and Min within 24 hours   BP: 172/75     BP: (158-209)/(75-101)   Pulse: 73     Pulse Min: 69 Max: 79  Resp: 17     Resp Min: 17 Max: 20  SpO2: 100 %     SpO2 Min: 98 % Max: 100 %  Temp: 37 C (98.6 F)     Temp Min: 36.4 C (97.5 F) Max: 37 C (98.6 F)  Weight: 62.8 kg (138 lb 7.2 oz)    CVP mean: (not recorded) No Data Recorded    Retired PAP systolic: (not recorded) No Data Recorded    Retired PAP diastolic: (not recorded) No Data Recorded    Retired PWP: (not recorded) No Data Recorded    Retired CO: (not recorded) No Data Recorded    Retired CI: (not recorded) No Data Recorded    Retired SVR: (not recorded) No Data Recorded        PHYSICAL EXAM:  General appearance: NAD, resting comfortably, smiling and laughing.   HEENT: NCAT. EOMI. PERRL. Sclerae anicteric. OP clear. Nares externally normal  Neck: No JVD. No LAD. No thyromegaly. No carotid bruits.  Heart: No heaves/thrills. Irregularly irregular. Normal S1, S2. 2/6 holosystolic murmur heard best at apex, no rubs, gallops. PMI is non-displaced.  Lungs: CTAB. No wheezes, rales, ronchi.  Abd: + BS. Soft. NTND.  Extremities: mild  trace edema to the ankles R>L. Warm and well perfused.  Skin: No rashes or ulcerations.  Neuro: CN II-XII intact. Strength and sensation intact grossly throughout.  Pulses: Radial pulses 2+ bilaterally. DP pulses 2+ bilaterally.  Mental Status: Alert and oriented x 3.    LABORATORY STUDIES:  Recent labs for the past 48 hours     10/17/17 1240    WHITE BLOOD CELL COUNT 6.6    HEMOGLOBIN 13.4    HEMATOCRIT 39.9    PLATELET COUNT 248      Recent labs for the past 48 hours     10/17/17 1239    GLUCOSE 96    UREA NITROGEN, BLOOD (BUN) 8    CREATININE BLOOD 0.42*    SODIUM 137    POTASSIUM 4.1    CHLORIDE 104    CARBON DIOXIDE TOTAL 24    CALCIUM 9.5    MAGNESIUM (MG) --    PHOSPHORUS (PO4) --        Recent labs for the past 48 hours     10/17/17 1239    INR 1.05  No results found for this basename: HGBA1C:* in the last 48 hours   Recent labs for the past 48 hours     10/17/17 1239    BILIRUBIN TOTAL 0.9    ASPARTATE TRANSAMINASE (AST) 44*    ALKALINE PHOSPHATASE (ALP) 57    ALBUMIN 3.5    PROTEIN 7.3        No results found for this basename: CHOL:*,LDLC:*,LDLD:*,HDL:*,TRIG:* in the last 48 hours   Recent labs for the past 48 hours     10/17/17 1239    CK-MB --    MYOGLOBIN --    B-TYPE NATRIURETIC PEPTIDE 218*       STUDIES/IMAGING:     EKG 10/17/17  Atrial fibrillation with V-paced complexes rate 75, nonspecific T wave inversions in inferior leads.     CXR 10/17/17  IMPRESSION:  1. Mild pulmonary congestion    CT Head  1. No acute intracranial hemorrhage or mass effect.    CT ABDOMEN  IMPRESSION:  1. No cause for left lower quadrant pain.   2. Pancolonic diverticulosis without acute inflammation.  3. The enhancement of the liver suggests congestive hepatopathy from  elevated right heart pressure.    ASSESSMENT AND PLAN   In summary, 43W with PMH of severe MR, SSS s/p dual-chamber PPM, Chronic Afib (on Pradaxa), PMR w/ inclusion body myositis, HTN, who presents poorly controlled HTN and neck pain.  (03/07 1751)    =======ACTIVE=======    #HTN: Patient with hypertension currently only on lisinopril and metoprolol. Was prescribed amlodipine, which the patient was unable to pick up and per chart review also refused to take intermittently. No signs/symptoms of hypertensive urgency or emergency as patient has no suggestion of end organ dysfunction. Blood pressure improved without intervention in the ED.    -Increased lisinopril to 20 mg BID   -Continue metoprolol succinate '50mg'$  daily   -Consider Nifedipine '10mg'$  TID    #Mitral Regurgitation: Has a hx of Severe MR, likely mixed with FMR > DMR.  Etiology: dilated LA and MV annulus from afib. Patient with very few signs of volume overload, though CXR with suggestion of pulmonary edema. Patient not on outpatient lasix. Last echo with EF of 65% and 2+ mitral regurgitation.   -Formal echocardiogram    #Atrial fibrillation.  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 Rate controlled, CHADs2VaSC = 4.    -continue pradaxa    #Hypothyroidism  -continue levothyroxine 168mg    #Polymyalgia rheumatica and inclusion body myositis  -Continue prednisone '1mg'$   -will need outpatient referral to rheumatology      =======HOSPITAL BUNDLE=======  # FEN:   - IVF: None  - Diet: Low Sodium diet  # Code Status: Full  # DVT PPX: on pradaxa  # Bowel Regimen: miralax and dulcolax  # Allergies:   Ciprofloxacin    Unknown-Explain in Comments  Epinephrine    Tachycardia  Erythromycin    N/A  Shellfish Containing Products    Angioedema  Tetracycline    Unknown-Explain in Comments  # Disposition: Likely home tomorrow  # PRESENT ON ADMISSION:  Are any of the following five conditions present or suspected on admission: decubitus ulcer, infection from an intravascular device, infection due to an indwelling catheter, surgical site infection or pneumonia? No.    Electronically signed by:  MModena Jansky MD PGY-1  Pager # [phone]   INTERVENTIONAL CARDIOLOGY ATTENDING ADDENDUM:  I  performed a history and physical examination of the patient, reviewed all relevant laboratory  and diagnostic studies, and discussed management with the resident on October 18, 2017. I reviewed the resident's note and agree with the documented findings and plan of care (except as edited above), which we developed together. Working diagnosis is hypertensive urgency (bordering onto emergency) due to likely medication non-adherence. 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 Nifedipine has been added to the patient's current regimen, but there is concern this patient has secondary (renovascular) hypertension. It may be prudent to pursue invasive physiologic assessment of the right renal artery stenosis before placement of a stent that is described as 60% on renal duplex ultrasound. However, the patient today (as she has been in the past) is very resistant to any invasive procedures. This negates the additional suggestion to consider CT or MRA of the renal arteries. Otherwise, the patient was noted to have a type 2 NSTEMI and is again not interested in an invasive procedure like cardiac catheterization. This, however, may not be necessary in the future if she is able to maintain a blood pressure goal of at least <140/90 mmHg. The patient will be discharged home today and will record her blood pressures twice per day for the next week; results will be sent to the [facility].    Camillia Herter, MD, [facility], [facility]  Health Sciences Clinical Assistant Professor  Interventional Cardiology  Division of Cardiovascular Medicine  Email: [email or web address]  Office: [phone]  Pager: [phone]

## 2017-10-17 NOTE — ED Nursing Note (Signed)
Received report from Zel RN from break relief period. Assuming care of patient at this time.

## 2017-10-17 NOTE — ED Nursing Note (Signed)
First contact with pt. Pt here for elevated BP and neck pain. States neck pain started yesterday. Pt with intact ROM. Pt on BP meds. States SBP in 200's. Pt denies HA, CP or abd pain. Pt ambulates to BR with RN. Steady slow gait. Even unlabored resp.

## 2017-10-18 ENCOUNTER — Telehealth: Payer: Self-pay | Admitting: Cardiovascular Disease

## 2017-10-18 DIAGNOSIS — I160 Hypertensive urgency: Secondary | ICD-10-CM

## 2017-10-18 LAB — BASIC METABOLIC PANEL
CALCIUM: 9 mg/dL (ref 8.6–10.5)
CARBON DIOXIDE TOTAL: 25 mmol/L (ref 24–32)
CHLORIDE: 103 mmol/L (ref 95–110)
CREATININE BLOOD: 0.35 mg/dL — AB (ref 0.44–1.27)
GLUCOSE: 77 mg/dL (ref 70–99)
POTASSIUM: 3.7 mmol/L (ref 3.3–5.0)
SODIUM: 138 mmol/L (ref 135–145)
UREA NITROGEN, BLOOD (BUN): 8 mg/dL (ref 8–22)

## 2017-10-18 LAB — CBC WITH DIFFERENTIAL
BASOPHILS % AUTO: 1 %
BASOPHILS ABS AUTO: 0 10*3/uL (ref 0.0–0.2)
EOSINOPHIL % AUTO: 2.5 %
EOSINOPHIL ABS AUTO: 0.1 10*3/uL (ref 0.0–0.5)
HEMATOCRIT: 38.6 % (ref 36.0–46.0)
HEMOGLOBIN: 12.8 g/dL (ref 12.0–16.0)
LYMPHOCYTE ABS AUTO: 2 10*3/uL (ref 1.0–4.8)
LYMPHOCYTES % AUTO: 45.8 %
MCH: 31.7 pg (ref 27.0–33.0)
MCHC: 33.2 % (ref 32.0–36.0)
MCV: 95.3 fL (ref 80.0–100.0)
MONOCYTES % AUTO: 13.6 %
MONOCYTES ABS AUTO: 0.6 10*3/uL (ref 0.1–0.8)
MPV: 8.8 fL (ref 6.8–10.0)
NEUTROPHILS % AUTO: 37.1 %
Neutrophils Abs Auto: 1.6 10*3/uL — ABNORMAL LOW (ref 1.8–7.7)
Platelet Count: 227 10*3/uL (ref 130–400)
RDW: 12.9 % (ref 0.0–14.7)
RED CELL COUNT: 4.05 10*6/uL (ref 4.00–5.20)
WHITE BLOOD CELL COUNT: 4.4 10*3/uL — AB (ref 4.5–11.0)

## 2017-10-18 LAB — ECHOCARDIOGRAM COMPLETE
IVSD 2D: 1.11 cm (ref 0.6–0.9)
LEFT INTERNAL DIMENSION IN SYSTOLE: 2.74 cm
LEFT VENTRICULAR INTERNAL DIMENSION IN DIASTOLE: 4.09 cm (ref 3.8–5.2)
LVEF (EST): 65 %
POSTERIOR WALL: 1.31 cm (ref 0.6–0.9)
TAPSE: 1.91 cm
TV PEAK SYSTOLIC PULMONARY ARTERY PRESSURE: 30.09 mmHg

## 2017-10-18 LAB — C DIFFICILE SURVEILLANCE TEST: TEST RESULT 2: NEGATIVE

## 2017-10-18 LAB — HEPATIC FUNCTION PANEL
ALANINE TRANSFERASE (ALT): 34 U/L (ref 5–54)
ALBUMIN: 2.9 g/dL — AB (ref 3.2–4.6)
ALKALINE PHOSPHATASE (ALP): 47 U/L (ref 35–115)
ASPARTATE TRANSAMINASE (AST): 38 U/L (ref 15–43)
BILIRUBIN DIRECT: 0.2 mg/dL (ref 0.0–0.2)
BILIRUBIN TOTAL: 0.6 mg/dL (ref 0.3–1.3)
PROTEIN: 6.3 g/dL (ref 6.3–8.3)

## 2017-10-18 MED ORDER — NIFEDIPINE ER 30 MG TABLET,EXTENDED RELEASE 24 HR
30.0000 mg | EXTENDED_RELEASE_CAPSULE | Freq: Every day | ORAL | Status: DC
Start: 2017-10-18 — End: 2017-10-18
  Administered 2017-10-18: 30 mg via ORAL
  Filled 2017-10-18: qty 1

## 2017-10-18 MED ORDER — NIFEDIPINE ER 30 MG TABLET,EXTENDED RELEASE 24 HR
30.0000 mg | EXTENDED_RELEASE_CAPSULE | Freq: Every day | ORAL | 0 refills | Status: DC
Start: 2017-10-19 — End: 2017-11-06

## 2017-10-18 MED ORDER — LISINOPRIL 20 MG TABLET
20.0000 mg | ORAL_TABLET | Freq: Two times a day (BID) | ORAL | 0 refills | Status: DC
Start: 2017-10-18 — End: 2017-11-11

## 2017-10-18 NOTE — Discharge Instructions (Signed)
We increased your lisinopril to twice a day  We continued your metoprolol  We added nifidipine 30mg  daily for better blood pressure control    Your Ejection fraction was 65%, and your mitral regurgitation is stable    Please follow-up with Dr. Candiss Norse in clinic in your previously scheduled appointment.

## 2017-10-18 NOTE — Discharge Summary (Addendum)
DISCHARGE SUMMARY  Date of Admission:   10/17/2017 0845 Date of Discharge 10/18/2017   Admitting  Service: Cardiology Discharging Service: (A) Cardiology    Attending Physician at time of Discharge: Camillia Herter, MD        Comments to Outpatient Physician:  1. Patient needs blood pressure control, and monitoring. 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   2. Has cardiologist Dr. 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     Medications at time of Discharge:  Current Discharge Medication List      START taking these medications    Details   NIFEdipine (PROCARDIA XL) 30 mg ER Tablet Take 1 tablet by mouth every morning.  Qty: 30 tablet, Refills: 0         CONTINUE these medications which have CHANGED    Details   Lisinopril (PRINIVIL, ZESTRIL) 20 mg Tablet Take 1 tablet by mouth 2 times daily.  Qty: 60 tablet, Refills: 0         CONTINUE these medications which have NOT CHANGED    Details   Alprazolam (XANAX) 0.25 mg Tablet Tablet Take by mouth.      Conjugated Estrogens (PREMARIN) 0.625 mg Tablet Take 0.625 mg by mouth every day.      Digoxin (LANOXIN) 125 mcg Tablet Take 1 tablet by mouth every day.  Qty: 90 tablet, Refills: 3      Metoprolol Succinate (TOPROL XL) 50 mg SR Tablet TAKE ONE TABLET BY MOUTH ONE TIME DAILY   Qty: 90 tablet, Refills: 3      PRADAXA 150 mg Capsule Take 150 mg by mouth 2 times daily.                  PredniSONE (DELTASONE) 1 mg Tablet Take 1 mg by mouth every morning after a meal.      SYNTHROID 125 mcg Tablet Take 125 mcg by mouth every morning before a meal.                     STOP taking these medications        CloNIDine (CATAPRES) 0.1 mg Tablet Comments:   Reason for Stopping:                Procedure(s) Performed:   Echocardiogram    Consultation(s):   None    Reason for Admission and Brief HPI:   Brittany Stokes is a 68yr female with hx of severe MR, HTN, a-fib, sick sinus syndrome with current pacemaker presenting with L posterior neck pain and general complaints of fatigue and malaise. These symptoms have been present x 1 week, but she has had generalized fatigue x 2-3 weeks. She has had diarrhea and diffuse abdominal pain, and has noted BRBPR only on the toilet paper. She denies gross blood, and has no hx of hemorrhoids. She reports swelling in her feet R>L and denies any recent weight-gain, orthopnea, PND. Has a pacemaker that was interrogated on 08/28/2017. The patient does note that she has been fighting shingles x 3 months and her husband has been hospitalized recently.    The patient has had viral symptoms of productive cough of white sputum and congestion x  2 weeks and believes that she has a respiratory infection. She denies any chest pain or shortness of breath, no visual changes or any fever or chills. The patient is reportedly compliant with her hypertensive regimens, but previously has been given PO hydralazine, which she has historically refused to take because she states that it doesn't work, she also has previously been been prescribed clonidine which she intermittently took until 2/26 when she was prescribed amlodipine. The patient has not picked up her amlodipine until today, on chart review it appears that the patient was prescribed this in December 2018, but stopped this due to swelling in hands and feet.    Patient has a hx of PMR with inclusion body myositis and is currently reportedly on 1mg  prednisone daily. Reports no follow-up with rheumatology since her previous doctor retired.    Hospital Course:   #HTN: Patient with hypertension currently only on lisinopril and metoprolol. Was prescribed  amlodipine, which the patient was unable to pick up and per chart review also refused to take intermittently. No signs/symptoms of hypertensive urgency or emergency as patient has no suggestion of end organ dysfunction. Blood pressure improved without intervention in the ED.              -Increased lisinopril to 20 mg BID             -Continue metoprolol succinate 50mg  daily             -Consider Nifedipine 10mg  TID    #Mitral Regurgitation: Has a hx of Severe MR, likely mixed with FMR >DMR. Etiology: dilated LA and MV annulus from afib. Patient with very few signs of volume overload, though CXR with suggestion of pulmonary edema. Patient not on outpatient lasix. Last echo with EF of 65% and 2+ mitral regurgitation.   -Formal echocardiogram    #Atrial fibrillation.  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 Rate controlled, CHADs2VaSC = 4.    -continue pradaxa    #Hypothyroidism  -continue levothyroxine 160mcg    #Polymyalgia rheumatica and inclusion body myositis  -Continue prednisone 1mg   -will need outpatient referral to rheumatology       COMPLICATIONS DURING ADMISSION     Complication(s) During Admission: None         Vital Signs:  Current Vitals  Temp: 36.5 C (97.7 F)  BP: 143/68  Pulse: 62  Resp: 18  SpO2: 96 %      Weight: 62.7 kg (138 lb 3.7 oz)     Physical Exam:  General appearance: NAD, resting comfortably, smiling and laughing.   HEENT: NCAT. EOMI. PERRL. Sclerae anicteric. OP clear. Nares externally normal  Neck: No JVD. No LAD. No thyromegaly. No carotid bruits.  Heart: No heaves/thrills. Regular rate, normal rhythm. Normal S1, S2. 2/6 holosystolic murmur heard best at apex, no rubs, gallops. PMI is non-displaced.  Lungs: CTAB. No wheezes, rales, ronchi.  Abd: + BS. Soft. NTND.  Extremities: mild trace edema to the ankles R>L. Warm and well perfused.  Skin: No rashes or ulcerations.  Neuro: CN II-XII intact. Strength and sensation intact grossly throughout.  Pulses: Radial  pulses 2+ bilaterally. DP pulses 2+ bilaterally.  Mental Status: Alert and oriented x 3.      Lines on discharge: None         CONDITION AT DISCHARGE     Condition at Discharge: Stable            DISCHARGE DISPOSITION     Discharge To: Home  Discharge Instructions:     DISCHARGE DIET     Discharge Diet: Low Sodium (2 gms)      DISCHARGE FLUID INSTRUCTIONS    2 L fluid restriction     Discharge Fluid Instructions: Restrict, Specify mls / 24 hours in Comments      [place]     Discharge Activity Restrictions: Increase exercise/mobilization    Discharge Activity Restrictions: Patient may return to work / school, specify date in comments          Recommended Follow Up Appointments:  Sidney Ace, MD  [facility]  [place] [address]  [phone]      Follow-up on previously scheduled appointment.        Scheduled Appointments:    Future Appointments   Date Time Provider [place]   10/21/2017  3:00 PM TESTS [facility] [place] None   11/18/2017  9:40 AM Sidney Ace, MD [facility] A        Pertinent Lab, Study, and Image Findings:   Lab Results   Lab Name Value Date/Time    NA 138 10/18/2017 03:15 AM    NA 139 11/09/2015 11:30 AM    K 3.7 10/18/2017 03:15 AM    K 4.0 11/09/2015 11:30 AM    CL 103 10/18/2017 03:15 AM    CL 106 11/09/2015 11:30 AM    CO2 25 10/18/2017 03:15 AM    CO2 24 11/09/2015 11:30 AM    BUN 8 10/18/2017 03:15 AM    BUN 11 11/09/2015 11:30 AM    CR 0.35 (L) 10/18/2017 03:15 AM    CR 0.45 11/09/2015 11:30 AM    GLU 77 10/18/2017 03:15 AM    GLU 86 11/09/2015 11:30 AM    CA 9.0 10/18/2017 03:15 AM    CA 9.5 11/09/2015 11:30 AM    MG 1.9 10/17/2017 12:39 PM     Lab Results   Lab Name Value Date/Time    WBC 4.4 (L) 10/18/2017 03:15 AM    WBC 6.7 06/22/2002 10:03 AM    HGB 12.8 10/18/2017 03:15 AM    HGB 13.0 06/22/2002 10:03 AM    HCT 38.6 10/18/2017 03:15 AM    HCT 37.9 06/22/2002 10:03 AM    PLT 227 10/18/2017 03:15 AM    PLT 331  06/22/2002 10:03 AM    MCV 95.3 10/18/2017 03:15 AM    MCV 95.5 06/22/2002 10:03 AM    RDW 12.9 10/18/2017 03:15 AM    RDW 13.0 06/22/2002 10:03 AM      Lab Results   Lab Name Value Date/Time    AST 38 10/18/2017 03:15 AM    ALT 34 10/18/2017 03:15 AM    ALP 47 10/18/2017 03:15 AM    TBIL 0.6 10/18/2017 03:15 AM    BILID 0.2 10/18/2017 03:15 AM    ALB 2.9 (L) 10/18/2017 03:15 AM     Lab Results   Lab Name Value Date/Time    INR 1.05 10/17/2017 12:39 PM      No results found for: HGBA1C  No results found for: CHOL, LDLCDIRECT, LDLC, HDL, TRIG  No results found for: TROPONINI  Lab Results   Lab Name Value Date/Time    TSH 0.99 10/17/2017 12:39 PM    TSH 4.01 06/22/2002 10:03 AM    FT4 1.16 04/08/2017 12:00 PM    FT4 1.21 06/22/2002 10:03 AM           MICROBIOLOGY RESULTS  MRSA negative  C-Diff negative    IMAGING RESULTS  CXR 10/17/17  IMPRESSION:  1. Mild pulmonary congestion    CT Head  1. No acute intracranial hemorrhage or mass effect.    CT ABDOMEN  IMPRESSION:  1. No cause for left lower quadrant pain.   2. Pancolonic diverticulosis without acute inflammation.  3. The enhancement of the liver suggests congestive hepatopathy from  elevated right heart pressure.    EKG   EKG 10/17/17  Atrial fibrillation with V-paced complexes rate 75, nonspecific T wave inversions in inferior leads.     PROCEDURE RESULTS  None    Studies Pending at Time of Discharge:  - None      Electronically signed by:  Modena Jansky, MD PGY-1  Emergency Medicine  Pager # [phone]      Total time spent on discharge summary: 20 minutes    INTERVENTIONAL CARDIOLOGY ATTENDING ADDENDUM:  I performed a history and physical examination of the patient, reviewed all relevant laboratory and diagnostic studies, and discussed management with the resident on October 18, 2017. I reviewed the resident's note and agree with the documented findings and plan of care (except as edited above), which we developed together. Working diagnosis is hypertensive  urgency (bordering onto emergency) due to likely medication non-adherence. 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 Nifedipine has been added to the patient's current regimen, but there is concern this patient has secondary (renovascular) hypertension. It may be prudent to pursue invasive physiologic assessment of the right renal artery stenosis before placement of a stent that is described as 60% on renal duplex ultrasound. However, the patient today (as she has been in the past) is very resistant to any invasive procedures. This negates the additional suggestion to consider CT or MRA of the renal arteries. Otherwise, the patient was noted to have a type 2 NSTEMI and is again not interested in an invasive procedure like cardiac catheterization, as it is possible the patient does have obstructive coronary artery disease. This procedure, however, may not be necessary in the future if she is able to maintain a blood pressure goal of at least <140/90 mmHg, and continues to deny chest pain or other angina equivalent. The patient will be discharged home today since her blood pressure has consistently been at goal this afternoon, and will obtain her blood pressure measurements twice per day for the next week; results will be sent to the [place] by [place]. Of note, the patient has a grade 3/6 holosystolic murmur best heard in the apex that radiates to the left axillae and becomes louder with handgrip.     Camillia Herter, MD, [facility], [facility]  Health Sciences Clinical Assistant Professor  Interventional Cardiology  Division of Cardiovascular Medicine  Email: [email or web address]  Office: [phone]  Pager: [phone]

## 2017-10-18 NOTE — Nurse Assessment (Signed)
ASSESSMENT NOTE    Note Started: 10/17/2017, 19:50     Initial assessment completed and recorded in EMR.  Report received from day shift nurse and orders reviewed. Plan of Care reviewed and updated, discussed with patient.  Nash Mantis,  RN

## 2017-10-18 NOTE — Telephone Encounter (Signed)
Returned call to pt. Verified pt ID. She is currently admitted in the hospital. Called to request her ECHO results and wonders if she can be discharged today. She has expensive tickets to the theater tonight that she does not want to miss.   Encouraged pt to discuss her care and d/c plans with inpatient care team.   Informed bedside nurse of pt call.   Wende Mott, RN

## 2017-10-18 NOTE — Nurse Focus (Signed)
DISCHARGE NOTE    Pt ready for DC. Pt verbalizes understanding of all DC instructions including meds and follow up appointments. Pt ambulating with walker, IV Dc'd Telemetry DC'd. Pt DC'd to home. Vassie Loll, RN

## 2017-10-18 NOTE — Plan of Care (Signed)
Problem: Patient Care Overview  Goal: Plan of Care Review  Respiratory Care Evaluation - Pulmonary Hygiene Pathway Initial Evaluation    Name: Brittany Stokes Admit Date:   10/17/2017  8:45 AM Date of Birth:   May 10, 1933   Age: 84 yrs Sex: female        Chief Complaint:   Chief Complaint   Patient presents with    Neck Pain       Risk Factor Analysis:    *If any part of the risk factor analysis cannot be filled out, do not apply the tool: assess/tx as appropriate. Physician may order a new evaluation via the risk factor analysis at any time.    Score with Spirometry  0 - 4 points = low risk: no atelectasis; no retained secretions  5 - 9 points = moderate risk  10 - 17 points = high risk  18 - 24 points = acute risk    Score without Spirometry  0 - 3 points = low risk: no atelectasis; no retained secretions  4 - 7 points = moderate risk  8 - 14 points = high risk  15 - 20 points = acute risk    Spirometry:  No spirometry on file  Age:  >= 75 = 2  Obesity:  <150% IBW = 0  Surgery Location:  None = 0  Pulmonary Hx:  None = 0  Incentive Spirometry:  < 59ml/kg IBW = 1  Atelectasis/consolidation on CXR:  no atelectasis/consolidation/cxray = 0  Smoking Hx:  quit >= 5 yrs ago = 0  Mobility Issues:   None = 0    Risk Factor Total Score: 3  Risk Level: Low Risk          Treatment Plan by RT:   Modality: None  Frequency: None    Treatment Plan by RN:  4x/day deep breathing, I.S., cough/splint, ambulate/chair per pathway.     *Moderate through acute risk patients will be re-evaluated every 24 hours using the Progress Scoring Tool.    If patient's condition changes please reorder Respiratory Care Evaluation.    Lyndee Leo

## 2017-10-18 NOTE — Nurse Focus (Signed)
Spoke with MD at nurses station, pt SBP dropped to the 150's. MD would like patient to take dose of nifedipine now and continue to monitor patient BP. Will continue to monitor.

## 2017-10-18 NOTE — Nurse Focus (Signed)
Patient called me to the room frustrated, stating that she has not slept due to her roommates TV being on and wanted to know if she could have it turned off. I said of course. She also stated that she is feeling like her BP is high again. SBP is currently in the 170's again. Spent extensive time talking with patient and getting her to calm down, she was in tears over her BP being elevated again. Cross cover MD notified and requested to have her BP re-taken and txt page the results to the MD. Will continue to monitor.

## 2017-10-18 NOTE — Plan of Care (Signed)
Problem: Patient Care Overview  Goal: Plan of Care Review  Outcome: Ongoing (interventions implemented as appropriate)  Evaluation Note For All Goals  Patient BP continued to fluctuate through the night, patient states that this hypertensive event feels different from previous hypertensive emergency episodes that she has had in the past. She continues to be very anxious about new medications and requires a lot of support and explanation about medications. She stated several times that she often cuts her pills in half at home because she does not feel she always needs a full dose. Cross cover MD team made aware of this statement. Patient SBP down into the 140's this am. She was not able to sleep well last night. Patient should discharge when medically stable. Will continue to monitor.   Goal: Individualization and Mutuality  Outcome: Ongoing (interventions implemented as appropriate)    Goal: Discharge Needs Assessment  Outcome: Ongoing (interventions implemented as appropriate)    Goal: Interprofessional Rounds/Family Conf  Outcome: Ongoing (interventions implemented as appropriate)      Problem: Fall Risk (Adult)  Goal: Identify Related Risk Factors and Signs and Symptoms  Related risk factors and signs and symptoms are identified upon initiation of Human Response Clinical Practice Guideline (CPG).  Outcome: Ongoing (interventions implemented as appropriate)    Goal: Absence of Fall  Patient will demonstrate the desired outcomes by discharge/transition of care.  Outcome: Ongoing (interventions implemented as appropriate)      Problem: Hypertensive Disease/Crisis (Arterial) (Adult)  Goal: Signs and Symptoms of Listed Potential Problems Will be Absent, Minimized or Managed (Hypertensive Disease/Crisis)  Signs and symptoms of listed potential problems will be absent, minimized or managed by discharge/transition of care (reference Hypertensive Disease/Crisis (Arterial) (Adult) CPG).  Outcome: Ongoing (interventions  implemented as appropriate)

## 2017-10-18 NOTE — Nurse Focus (Signed)
Talked with patient for over 20 minutes and re-checked BP a couple times. She is nervous about taking an additional cardiac medication, she wants me to come back at 0300 to re-check her BP before taking the medication. Will come back and check her then. Will notify MD of any adverse changes or if patient decides to refuse medication.

## 2017-10-18 NOTE — Telephone Encounter (Signed)
Patient is calling in, states that she had echo done today and is waiting for results. States that she would like to know the results because that is the only way she can leave the ER.  Please call patient back

## 2017-10-18 NOTE — Nurse Focus (Signed)
Dr. Titus Dubin notified re: pt Mag level not sent this AM. Per MD, no need for mag lab at this time. Vassie Loll, RN

## 2017-10-18 NOTE — Nurse Assessment (Addendum)
ASSESSMENT NOTE     Initial assessment completed and recorded in EMR.  Report received from night shift nurse and orders reviewed. Rec'd patient lying in bed AAOx4. VSS, afebrile. + CMS to all extremities. Lungs CTA, no c/o pain. Tele=normal sinus rhythm. Tol PO well. Voids to bathroom. Will continue to monitor. Plan of Care reviewed and appropriate, discussed with patient. Call to Dr. Titus Dubin re: pt Metoprolol and Nifidipine given at 0300 this AM.  Per MD, will discuss on rounds and advise RN when next dose Metoprolol should be given. Vassie Loll, RN

## 2017-10-19 ENCOUNTER — Telehealth: Payer: Self-pay | Admitting: Cardiovascular Disease

## 2017-10-19 LAB — CULTURE SURVEILLANCE, MRSA

## 2017-10-19 NOTE — Telephone Encounter (Signed)
Brittany Stokes called because she is concerned that her blood pressure is in the 111B systolic this morning and she has not received Nifedipine yet.  I recommended that she go to the pharmacy this morning and have the medication filled.    Jonetta Osgood, MD  Cardiology fellow

## 2017-10-21 ENCOUNTER — Telehealth: Payer: Self-pay | Admitting: Cardiovascular Disease

## 2017-10-21 ENCOUNTER — Other Ambulatory Visit: Payer: Self-pay

## 2017-10-21 NOTE — Telephone Encounter (Signed)
Patient discharged 10/18/17. Nifedipine ER 30mg  was added. Patient calling with concerns of low BP in the mornings. SBP readings often low 100s and upper 90s.  Also taking Lisinopril 20mg  BID and Toprol 50mg  once daily.   She has been taking Lisinopril, Toprol and Nifedipine in am and Lisinopril in pm.  Yesterday she took Nifedipine in the evening. AM BP was slightly better, SBP 108 but she felt it was too low to take Lisinopril and Toprol.   At time of our call, BP 136/74, patient agrees to take medications now.     She wants to know if she can hold evening dose of Lisinopril and take just nifedipine in the evening in hopes that am BP will not be so low.   Advised patient that I will discuss with Dr. Candiss Norse and call patient back with plan.    Lindie Spruce, RN

## 2017-10-21 NOTE — Telephone Encounter (Addendum)
Patient is calling in, states that she can't take NIFEdipine (PROCARDIA XL) 30 mg ER Tablet. States that her BP was 98/83 and she doesn't think she can take another Nifedipine.   Please call patient back.

## 2017-10-21 NOTE — Telephone Encounter (Signed)
Discussed with Dr. Candiss Norse, ok for patient to hold evening dose of Lisinopril and take only nifedipine in the evening.   AM meds: Lisinopril 20mg , Toprol 50mg    PM meds: Nifedipine ER 30mg      Patient is agreeable to plan and will to monitor BP closely and call at the end of the week with readings.     Lindie Spruce, RN

## 2017-10-21 NOTE — Telephone Encounter (Signed)
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       Per: patient. Three patient identifiers used.    Discharge Instruction Clarification:    Patient has been prescribed the following medications and is concerned with taking all of them together to control blood pressure. Patient is fearful it will decrease her blood pressure too much.     Nifedipine 30 mg Take 1 tablet by mouth every morning.  Lisinopril 20 mg tab take one tab two times per day  Metoprolol 50 mg Take one tab once daily    Patient reported home blood pressure reading this morning was 98/60 HR 83  Patient is concerned her blood pressure goes as high as 381 systolic but she is fearful of hypotension.       Advice:  Patient has an appointment that was previously scheduled with Dr. Candiss Norse in April. Recommended patient to follow up sooner for increased medication compliance. Transferred patient to cardiology clinic for scheduling. Patient to call PCP or present to the ED for any worsening of symptoms.       Per: patient verbalizes agreement to plan. Patient to follow up with Cardiology Clinic if further issues or concerns.    Time Spent to Close: 10 minutes    Mertha Finders, BSN, RN  Post-discharge [employer]  [phone] - Direct Line  [phone] - Post-discharge call department    Please send all EMR Responses to our work department pool:   P [facility] [place]

## 2017-10-23 ENCOUNTER — Telehealth: Payer: Self-pay | Admitting: Cardiovascular Disease

## 2017-10-23 DIAGNOSIS — I10 Essential (primary) hypertension: Principal | ICD-10-CM

## 2017-10-23 NOTE — Telephone Encounter (Signed)
Called patient.  Left message to return my call.  Dashea Mcmullan, RN, Cardiology

## 2017-10-23 NOTE — Telephone Encounter (Signed)
Patient is calling again states that she forgot to leave her number. Patient can be reached at [phone]

## 2017-10-23 NOTE — Telephone Encounter (Signed)
Patient is returning a phone call to nurse.      Thank You,  Brittany Stokes

## 2017-10-23 NOTE — Telephone Encounter (Signed)
Called and spoke with patient.  Identity verified.  She states the swelling of her feet started ~ 2 weeks ago, but she feels it has gotten worse since starting the Procardia.  Sbp today was 141.  She said her feet look "grotesque".  Follows a low sodium diet and has lost weight, not gained.  She has no other complaints.    I told her I would send this message to Dr. Candiss Norse and would call her back with any new instructions.  Daiva Eves, RN, Cardiology

## 2017-10-23 NOTE — Telephone Encounter (Signed)
Patient is calling stating that she was in the hospital over night was given new b/p medication NIFEdipine (PROCARDIA XL) 30 mg ER  States she is struggling with this medication states she cant even put on her shoe on because she is so swollen.  States her whole body is swollen.    Patient can be reached at [phone]      Thank You,  Irving Shows

## 2017-10-26 NOTE — Telephone Encounter (Signed)
Would stop nifedipine, it's prob whats causing her LE edema (pls list on allergies as well)    pls start HCTZ 25 daily.  BMP in one week.    Zenaida Deed, M.D., [facility], [facility]  Interventional Cardiology  Pgr [phone]

## 2017-10-27 LAB — ELECTROCARDIOGRAM WITH RHYTHM STRIP: QTC: 406

## 2017-11-01 ENCOUNTER — Encounter: Payer: Self-pay | Admitting: Cardiovascular Disease

## 2017-11-05 NOTE — Telephone Encounter (Signed)
Message below was not routed to triage pool and noticed today by our device tech when patient called in after sending a download.   Called patient, left message to return call.    Lindie Spruce, RN

## 2017-11-06 MED ORDER — HYDROCHLOROTHIAZIDE 25 MG TABLET
25.0000 mg | ORAL_TABLET | Freq: Every day | ORAL | 5 refills | Status: DC
Start: 2017-11-06 — End: 2017-11-11

## 2017-11-06 NOTE — Telephone Encounter (Signed)
Called and spoke with patient.  Identity verified.  She was a little upset that she is just now getting a response.  I apologized.  She said she has been feeling horrible lately and has had a productive cough and feeling fatigued for ~ three weeks, but the cough is getting better.  She may ask her doctor for a chest Xray.  She said her sbp was 100 and105 last night and this am, after taking Procardia and metoprolol respectively.    I informed of Dr. Keturah Barre instructions to start the new medication and get a blood draw one week after starting. She verbalized understanding.  She asked me to call her pharmacist to cancel her Nifedipine refill.  I told her I would do so.      Called her [facility] and spoke with pharmacist Merrilee Seashore.  He will discontinue her Procardia prescription.      I called the patient back to tell her I called her pharmacy, and left brief message to follow up with her pcp regarding her current symptoms, especially if they don't improve.  Daiva Eves, RN, Cardiology

## 2017-11-07 NOTE — Telephone Encounter (Signed)
Call transferred to me from the phone room.  She is frustrated and a little confused because she doesn't understand why Hctz was prescribed in place of Procardia, since it is a diuretic.  I explained to her that it affects her blood pressure as well.  She has not yet started it.  We discussed her medication regimen at length.  She said she took 1/2 of her nifedipine last night because she worries greatly about her blood pressure being high at night.    I suggested she take her metoprolol and hctz in the am's, and her lisinopril at night but she states "lisinopril does nothing for me".  She has only taken her metoprolol today.  She again is going take her lisinopril and Nifedipine this evening, then will start the hctz tomorrow, and take her meds as I suggested.  I told her she may be able to go back to her lisinopril twice a day but she sees Dr. Candiss Norse on April 1st, so will discuss everything with him at that time.  She had for further questions.  Daiva Eves, RN, Cardiology

## 2017-11-07 NOTE — Telephone Encounter (Signed)
Called patient back.  Left brief message informing that she doesn't have to call me back unless she wants to; I left her a message yesterday with the information I wanted to give her. Daiva Eves, RN, Cardiology

## 2017-11-07 NOTE — Telephone Encounter (Signed)
Patient is returning phone call to nurse.    Thank you, [employer]

## 2017-11-11 ENCOUNTER — Encounter: Payer: Self-pay | Admitting: Cardiovascular Disease

## 2017-11-11 ENCOUNTER — Ambulatory Visit: Payer: Medicare Other | Attending: Cardiovascular Disease | Admitting: Cardiovascular Disease

## 2017-11-11 ENCOUNTER — Ambulatory Visit: Payer: Medicare Other

## 2017-11-11 VITALS — BP 128/56 | HR 59 | Resp 18 | Ht 64.0 in | Wt 134.7 lb

## 2017-11-11 DIAGNOSIS — I340 Nonrheumatic mitral (valve) insufficiency: Principal | ICD-10-CM | POA: Insufficient documentation

## 2017-11-11 DIAGNOSIS — R9431 Abnormal electrocardiogram [ECG] [EKG]: Secondary | ICD-10-CM | POA: Insufficient documentation

## 2017-11-11 DIAGNOSIS — I10 Essential (primary) hypertension: Secondary | ICD-10-CM | POA: Insufficient documentation

## 2017-11-11 DIAGNOSIS — I482 Chronic atrial fibrillation: Secondary | ICD-10-CM | POA: Insufficient documentation

## 2017-11-11 DIAGNOSIS — I495 Sick sinus syndrome: Secondary | ICD-10-CM | POA: Insufficient documentation

## 2017-11-11 LAB — CBC WITH DIFFERENTIAL
BASOPHILS % AUTO: 0.7 %
BASOPHILS ABS AUTO: 0 10*3/uL (ref 0.0–0.2)
EOSINOPHIL % AUTO: 1.4 %
EOSINOPHIL ABS AUTO: 0.1 10*3/uL (ref 0.0–0.5)
HEMOGLOBIN: 13.4 g/dL (ref 12.0–16.0)
Hematocrit: 40.1 % (ref 36.0–46.0)
LYMPHOCYTE ABS AUTO: 3 10*3/uL (ref 1.0–4.8)
LYMPHOCYTES % AUTO: 45.4 %
MCH: 31.9 pg (ref 27.0–33.0)
MCHC: 33.5 % (ref 32.0–36.0)
MCV: 95.3 fL (ref 80.0–100.0)
MONOCYTES % AUTO: 11.1 %
MONOCYTES ABS AUTO: 0.7 10*3/uL (ref 0.1–0.8)
MPV: 8.8 fL (ref 6.8–10.0)
NEUTROPHIL ABS AUTO: 2.7 10*3/uL (ref 1.8–7.7)
NEUTROPHILS % AUTO: 41.4 %
PLATELET COUNT: 284 10*3/uL (ref 130–400)
RDW: 12.9 % (ref 0.0–14.7)
RED CELL COUNT: 4.21 10*6/uL (ref 4.00–5.20)
WHITE BLOOD CELL COUNT: 6.5 10*3/uL (ref 4.5–11.0)

## 2017-11-11 LAB — BASIC METABOLIC PANEL
CARBON DIOXIDE TOTAL: 25 mmol/L (ref 24–32)
CHLORIDE: 94 mmol/L — AB (ref 95–110)
CREATININE BLOOD: 0.39 mg/dL — AB (ref 0.44–1.27)
Calcium: 10 mg/dL (ref 8.6–10.5)
GLUCOSE: 101 mg/dL — AB (ref 70–99)
POTASSIUM: 4.1 mmol/L (ref 3.3–5.0)
SODIUM: 131 mmol/L — AB (ref 135–145)
UREA NITROGEN, BLOOD (BUN): 16 mg/dL (ref 8–22)

## 2017-11-11 MED ORDER — LISINOPRIL 20 MG TABLET
20.0000 mg | ORAL_TABLET | Freq: Every day | ORAL | 3 refills | Status: DC
Start: 2017-11-11 — End: 2018-01-13

## 2017-11-11 MED ORDER — HYDROCHLOROTHIAZIDE 25 MG TABLET
12.5000 mg | ORAL_TABLET | Freq: Every day | ORAL | 3 refills | Status: DC
Start: 2017-11-11 — End: 2018-06-10

## 2017-11-11 NOTE — Nursing Note (Signed)
Vitals were taken, screened for pain, pharmacy and allergies verified. EKG was also done.      Tanda Morrissey MA

## 2017-11-12 LAB — POC ELECTROCARDIOGRAM WITH RHYTHM STRIP: QTC: 458

## 2017-11-15 NOTE — Progress Notes (Signed)
Latitude Remote, reviewed on 10/15/17  Brittany Stokes had a unscheduled remote transmission for NSVT episodes.  Patient has a Company secretary pacemaker. Dr Zenaida Deed is her Primary Cardiologist.    Device function is normal, Lead function is normal  Battery status is good, estimating 1.5 years  Presenting EGM: Chronic AFib /Vp and Vs.    Arrhythmias/Comments: Since last reset on 08/28/17,  7 NSVT episodes  - EGMs show Atrial fibrillation w/ RVR. Longest lasting 5- 13 sec at 169-186bpm.    Afib 100% of he time, sheis on Pradaxa, Digoxin and Toprol.     Next scheduled remote isevery 91 daysand office visit is yearly in April.   Interrogated [id number] Brittany Stokes, HLT II    [name] MD, PH.D, F.A.C.C., F.A.S.E.  [place]  Department of Internal Medicine, Division of Cardiology    Current Outpatient Medications on File Prior to Visit   Medication Sig Dispense Refill    Alprazolam (XANAX) 0.25 mg Tablet Tablet Take by mouth.      Conjugated Estrogens (PREMARIN) 0.625 mg Tablet Take 0.625 mg by mouth every day.      Digoxin (LANOXIN) 125 mcg Tablet Take 1 tablet by mouth every day. 90 tablet 3    Metoprolol Succinate (TOPROL XL) 50 mg SR Tablet TAKE ONE TABLET BY MOUTH ONE TIME DAILY  90 tablet 3    PRADAXA 150 mg Capsule Take 150 mg by mouth 2 times daily.                  PredniSONE (DELTASONE) 1 mg Tablet Take 1 mg by mouth every morning after a meal.      SYNTHROID 125 mcg Tablet Take 125 mcg by mouth every morning before a meal.                   No current facility-administered medications on file prior to visit.

## 2017-11-15 NOTE — Progress Notes (Signed)
Date of Service: 10/15/2017    Following MD: Dr. [name]  Implanting MD: Dr. Aileen Fass  Referring MD: Dr. Zenaida Deed    Diagnosis: I49.5 Sick Sinus Syndrome / Brady-Tachy    Device:  [place] [id number] ADVANTIO  Implant date: 10/16/2011  RA Lead: [place] Model 4469 [place] II Sterox EZ  Implant date: 10/16/2011   RV Lead: [place] Model 4470 [place] II Sterox EZ  Implant date: 10/16/2011     Settings:  Mode DDDR  Lower rate 60 bpm  Max tracking rate 130 bpm  Max sensor rate 130 bpm  AV delay 350/350 ms  RA sensing sensitivity 0.2 mV  RV sensing sensitivity 0.6 mV  RA pacing polarity: Bipolar  RV pacing polarity: Bipolar  RA Sense polarity: Bipolar  RV Sense polarity: Bipolar  RA pacing: Amplitude 2.0 V @ Pulse Width 0.4 ms  RV pacing: Amplitude 1.2 V @ Pulse Width 0.4 ms    RA Test:  RA impedance 529 ohms  RA pacing threshold: Amplitude 0.5 V @ Pulse Width 0.4 ms    RV Test:  RV impedance 419 ohms  RV pacing threshold: Amplitude 0.6 V @ Pulse Width 0.4 ms    Battery:  Battery status MOS  Battery remaining longevity 18 months    Percent Paced:  RA percent paced 0 %  RV percent paced 74 %
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
## 2017-11-18 ENCOUNTER — Encounter: Payer: Self-pay | Admitting: Cardiovascular Disease

## 2017-11-26 NOTE — Progress Notes (Signed)
ATTENDING NOTE: I have seen and examined this patient.  Please refer to the Cardiology Fellow's note for the full details the history and physical examination of this visit.  I agree with the Fellow's findings and assessment.  I agree with the plans that we developed as outlined above. I was present during the entire procedure.  Samanthamarie Ezzell, MD

## 2017-11-27 ENCOUNTER — Ambulatory Visit: Payer: Medicare Other | Attending: Cardiovascular Disease

## 2017-11-27 DIAGNOSIS — Z45018 Encounter for adjustment and management of other part of cardiac pacemaker: Principal | ICD-10-CM | POA: Insufficient documentation

## 2017-12-23 ENCOUNTER — Encounter: Payer: Self-pay | Admitting: Cardiovascular Disease

## 2017-12-29 NOTE — Progress Notes (Signed)
Date of Service: 11/01/2017    Following MD: Dr. [name]  Implanting MD: Dr. Aileen Fass  Referring MD: Dr. Zenaida Deed    Diagnosis: I49.5 Sick Sinus Syndrome / Brady-Tachy    Device:  [place] [id number] ADVANTIO  Implant date: 10/16/2011  RA Lead: [place] Model 4469 [place] II Sterox EZ  Implant date: 10/16/2011   RV Lead: [place] Model 4470 [place] II Sterox EZ  Implant date: 10/16/2011     Settings:  Mode DDDR  Lower rate 60 bpm  Max tracking rate 130 bpm  Max sensor rate 130 bpm  AV delay 350/350 ms  RA sensing sensitivity 0.2 mV  RV sensing sensitivity 0.6 mV  RA pacing polarity: Bipolar  RV pacing polarity: Bipolar  RA Sense polarity: Bipolar  RV Sense polarity: Bipolar  RA pacing: Amplitude 2.0 V @ Pulse Width 0.4 ms  RV pacing: Amplitude 1.0 V @ Pulse Width 0.4 ms    RA Test:  RA impedance 568 ohms  RA pacing threshold: Amplitude 0.5 V @ Pulse Width 0.4 ms    RV Test:  RV impedance 464 ohms  RV pacing threshold: Amplitude 0.5 V @ Pulse Width 0.4 ms    Battery:  Battery status BOS  Battery remaining longevity 18 months    Percent Paced:  RA percent paced 0 %  RV percent paced 73 %

## 2017-12-29 NOTE — Progress Notes (Signed)
Latitude Remote, reviewed on 11/01/17.  [name] had an unscheduled remote transmission for a symptom activated event.  Patient has a [place] [phone] dual chamber pacemaker that was implanted by Dr. Davina Poke, now followed by Dr. [name].    Device function is normal, Lead function is normal  Battery status is good, estimating 2.5 years  Presenting EGM: AFib, Vp    Arrhythmias/Comments:   2 NSVT events. EGMs show AFib with  RVR between 160-190 bpm for 3-6 seconds.Pt complains of occasional  Palpitations.  Pt is in AFib 100% of the time and is on Pradaxa, Digoxin and Toprol.    Next scheduled remote is June and office visit will be scheduled soon.   Interrogated ZE:SPQZRA Kellems-Ross, HLT II    [name] MD, PH.D, F.A.C.C., F.A.S.E.  [place]  Department of Internal Medicine, Division of Cardiology

## 2017-12-31 NOTE — Progress Notes (Signed)
I have personally reviewed the report and agree with the findings.

## 2018-01-01 ENCOUNTER — Encounter: Payer: Self-pay | Admitting: Cardiovascular Disease

## 2018-01-01 NOTE — Telephone Encounter (Signed)
From: [name]  To: Sidney Ace, MD  Sent: 01/01/2018 3:36 PM PDT  Subject: Non-urgent Medical Advice Question    Has anyone noticed we have disconnected the latitude because we are selling our house?

## 2018-01-02 ENCOUNTER — Ambulatory Visit: Payer: Medicare Other | Attending: Cardiovascular Disease

## 2018-01-02 DIAGNOSIS — I10 Essential (primary) hypertension: Principal | ICD-10-CM | POA: Insufficient documentation

## 2018-01-02 LAB — BASIC METABOLIC PANEL
CALCIUM: 10 mg/dL (ref 8.6–10.5)
CARBON DIOXIDE TOTAL: 23 mmol/L — AB (ref 24–32)
CHLORIDE: 98 mmol/L (ref 95–110)
CREATININE BLOOD: 0.42 mg/dL — AB (ref 0.44–1.27)
GLUCOSE: 71 mg/dL (ref 70–99)
POTASSIUM: 4.1 mmol/L (ref 3.3–5.0)
SODIUM: 135 mmol/L (ref 135–145)
UREA NITROGEN, BLOOD (BUN): 17 mg/dL (ref 8–22)

## 2018-01-13 ENCOUNTER — Encounter: Payer: Self-pay | Admitting: Cardiovascular Disease

## 2018-01-13 ENCOUNTER — Ambulatory Visit: Payer: Medicare Other | Attending: Cardiovascular Disease | Admitting: Cardiovascular Disease

## 2018-01-13 VITALS — BP 142/77 | HR 61 | Ht 64.0 in | Wt 133.8 lb

## 2018-01-13 DIAGNOSIS — I517 Cardiomegaly: Secondary | ICD-10-CM | POA: Insufficient documentation

## 2018-01-13 DIAGNOSIS — I349 Nonrheumatic mitral valve disorder, unspecified: Principal | ICD-10-CM | POA: Insufficient documentation

## 2018-01-13 DIAGNOSIS — Z950 Presence of cardiac pacemaker: Secondary | ICD-10-CM | POA: Insufficient documentation

## 2018-01-13 DIAGNOSIS — I340 Nonrheumatic mitral (valve) insufficiency: Secondary | ICD-10-CM | POA: Insufficient documentation

## 2018-01-13 DIAGNOSIS — I10 Essential (primary) hypertension: Secondary | ICD-10-CM | POA: Insufficient documentation

## 2018-01-13 DIAGNOSIS — I482 Chronic atrial fibrillation: Secondary | ICD-10-CM | POA: Insufficient documentation

## 2018-01-13 DIAGNOSIS — R9431 Abnormal electrocardiogram [ECG] [EKG]: Secondary | ICD-10-CM | POA: Insufficient documentation
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
## 2018-01-13 NOTE — Nursing Note (Signed)
Vitals were taken, screened for pain, pharmacy and allergies verified. EKG performed.    Francis Doenges, MA

## 2018-01-16 LAB — POC ELECTROCARDIOGRAM WITH RHYTHM STRIP: QTC: 470

## 2018-01-17 NOTE — Progress Notes (Signed)
[employer] Remote, reviewed on 11/27/17  Brittany Stokes had a scheduled remote transmission.  Patient has a Company secretary pacemaker. Dr Zenaida Deed is her Primary Cardiologist.    Device function is normal, Lead function is normal  Battery status is good, estimating1year  Presenting EGM: Chronic AFib /Vp and Vs.    Arrhythmias/Comments:Since last reset on 11/01/17,  2 NSVT episodes  - EGMs show Atrial fibrillation w/ RVR. Longest lasting 8- 10 sec at 164-181bpm.    Afib 100% of he time, sheis on Pradaxa, Digoxin and Toprol.     Next scheduled remote isevery 91 daysand office visit is yearly in April.   Interrogated MP:NTIRWER [name], HLT II     [name] MD, PH.D, F.A.C.C., F.A.S.E.  [name]  Department of Internal Medicine, Division of Cardiology    Current Outpatient Medications on File Prior to Visit   Medication Sig Dispense Refill    Alprazolam (XANAX) 0.25 mg Tablet Tablet Take by mouth.      Conjugated Estrogens (PREMARIN) 0.625 mg Tablet Take 0.625 mg by mouth every day.      Digoxin (LANOXIN) 125 mcg Tablet Take 1 tablet by mouth every day. 90 tablet 3    Hydrochlorothiazide (HYDRO-DIURIL) 25 mg Tablet Take 0.5 tablets by mouth every morning. 45 tablet 3    Metoprolol Succinate (TOPROL XL) 50 mg SR Tablet TAKE ONE TABLET BY MOUTH ONE TIME DAILY  90 tablet 3    PRADAXA 150 mg Capsule Take 150 mg by mouth 2 times daily.                  PredniSONE (DELTASONE) 1 mg Tablet Take 1 mg by mouth every morning after a meal.      SYNTHROID 125 mcg Tablet Take 125 mcg by mouth every morning before a meal.                   No current facility-administered medications on file prior to visit.

## 2018-01-17 NOTE — Progress Notes (Signed)
I have personally reviewed the report and agree with the findings.

## 2018-01-17 NOTE — Progress Notes (Signed)
Date of Service: 11/27/2017    Following MD: Dr. [name]  Implanting MD: Dr. Aileen Fass  Referring MD: Dr. Zenaida Deed    Diagnosis: I49.5 Sick Sinus Syndrome / Brady-Tachy    Device:  [employer] [id number] ADVANTIO  Implant date: 10/16/2011  RA Lead: [place] Model 4469 [place] II Sterox EZ  Implant date: 10/16/2011   RV Lead: [place] Model 4470 [place] II Sterox EZ  Implant date: 10/16/2011     Settings:  Mode DDDR  Lower rate 60 bpm  Max tracking rate 130 bpm  Max sensor rate 130 bpm  AV delay 350/350 ms  RA sensing sensitivity 0.2 mV  RV sensing sensitivity 0.6 mV  RA pacing polarity: Bipolar  RV pacing polarity: Bipolar  RA Sense polarity: Bipolar  RV Sense polarity: Bipolar  RA pacing: Amplitude 2.0 V @ Pulse Width 0.4 ms  RV pacing: Amplitude 1.1 V @ Pulse Width 0.4 ms    RA Test:  RA impedance 424 ohms  RA pacing threshold: Amplitude 0.5 V @ Pulse Width 0.4 ms    RV Test:  RV impedance 453 ohms  RV pacing threshold: Amplitude 0.6 V @ Pulse Width 0.4 ms    Battery:  Battery status MOS  Battery remaining longevity 12 months    Percent Paced:  RA percent paced 0 %  RV percent paced 72 %

## 2018-02-28 ENCOUNTER — Ambulatory Visit
Admission: RE | Admit: 2018-02-28 | Discharge: 2018-02-28 | Disposition: A | Discharge status: Home / Self Care | Payer: Medicare Other | Source: Ambulatory Visit | Attending: Cardiovascular Disease | Admitting: Cardiovascular Disease

## 2018-02-28 DIAGNOSIS — I361 Nonrheumatic tricuspid (valve) insufficiency: Secondary | ICD-10-CM | POA: Insufficient documentation

## 2018-02-28 DIAGNOSIS — I340 Nonrheumatic mitral (valve) insufficiency: Principal | ICD-10-CM | POA: Insufficient documentation

## 2018-02-28 DIAGNOSIS — I517 Cardiomegaly: Secondary | ICD-10-CM | POA: Insufficient documentation

## 2018-02-28 LAB — ECHOCARDIOGRAM COMPLETE
IVSD 2D: 1.1 cm (ref 0.6–0.9)
LEFT INTERNAL DIMENSION IN SYSTOLE: 2.62 cm
LEFT VENTRICULAR INTERNAL DIMENSION IN DIASTOLE: 4.16 cm (ref 3.8–5.2)
POSTERIOR WALL: 1 cm (ref 0.6–0.9)
TAPSE: 1.7 cm
TV PEAK SYSTOLIC PULMONARY ARTERY PRESSURE: 36.56 mmHg

## 2018-03-04 ENCOUNTER — Encounter: Payer: Self-pay | Admitting: Cardiovascular Disease

## 2018-03-04 NOTE — Telephone Encounter (Signed)
Sidney Ace, MD   to Me       03/04/18 9:30 AM   Note      Everything is unchanged.  Continue as is.    gs

## 2018-03-04 NOTE — Telephone Encounter (Signed)
Numbers can fluctuate between studies.    It was 38, then 36, then 30, and now 36.  As long as it is < 40 is all that matters.    RA size is same when i've reviewed them side by side.    Echo is globally unchanged from prior.  No changes.  Cont meds as is.    gs

## 2018-03-04 NOTE — Telephone Encounter (Signed)
Everything is unchanged.  Continue as is.    gs

## 2018-03-04 NOTE — Telephone Encounter (Signed)
From: Kamill Fulbright  Sent: 03/04/2018 8:06 AM PDT  Subject: Test Result Question    This is a message for Dr. Candiss Norse. After receiving my latest Echo results in a timely manner I have a few question.  What are your thoughts and concerns regarding this report? Do I have to do anything different? Is my heart worse? Any recommendations would be appreciated.  Thank you in advance for addressing my concerns.

## 2018-04-10 ENCOUNTER — Ambulatory Visit: Payer: Medicare Other | Admitting: RHEUMATOLOGY

## 2018-04-15 ENCOUNTER — Ambulatory Visit: Payer: Medicare Other | Admitting: RHEUMATOLOGY

## 2018-04-15 VITALS — BP 116/68 | HR 60 | Wt 134.0 lb

## 2018-04-15 DIAGNOSIS — M353 Polymyalgia rheumatica: Principal | ICD-10-CM

## 2018-04-15 NOTE — Nursing Note (Signed)
Vital signs obtained, chief complaint identified, allergies verified, screened for pain, med history taken, pharmacy verified.  [name], MA I

## 2018-04-15 NOTE — Progress Notes (Signed)
Rheumatology Initial Consultation Note    Reason for consult : per Referring physician : Muscle pain    Dear Dr. [name], MD   I had the pleasure of seeing Brittany Stokes in consultation in my rheumatology clinic    CC : Muscle pain    HPI : Brittany Stokes, 84yrold female presents to Rheumatology clinic for initial evaluation.   Patient is a very pleasant 84year old Caucasian female accompanied by her spouse.  She reports that she has been diagnosed with inclusion body myositis in 2009.  Patient has seen multiple rheumatologist over the years, along the line she was diagnosed with polymyalgia rheumatica with increased pain and stiffness in her shoulders and thighs with some swelling in her hand was started on prednisone in 2009, the dose at that time was 15 mg in the morning and 5 mg at night and all symptoms became normal within a few days.  Patient was on prednisone for few years and then she went off the prednisone but then she had again a relapse in 2016 16 was put back on the prednisone.      Patient now reports of increased worsening muscle aches and pains and muscle weakness he denies any falls swelling or numbness.  This is had over the past few weeks.  She saw her PCP who did blood work on 0805 to the 19th show normal CBC normal CMP ESR elevated at 52 AST elevated at 38-, UA is normal.  She is denying any jaw pain or temporal headaches.  No rashes.        EMG done 11/28/2007: there is evdience for myopathy, incresed insertional activity and fibs, positive sharp waves for all the muscles of the right upper, lower extremities and paralumbar muscles. ncs minor cts right.   minor abnormality right posterior tibial.     MRI Of the upper leg w/o"  distal portion of vastus lateralis muscles bilaterally symmetrically, increased signal on stir images,     muscle bx from the L thigh:  done 11/12/2007:  left thigh muscle bx: active inflammation with muscle fiber destruction, consistent with inflammatory  myopathy:  -active interstitial inflammatory process  -lymphocytic infiltrates in the endomysium and perimysium of the muscle.   evidence of cell mediated distruction of the muscle fibers that appear degenerated and invaded by lymphocytes and histiocytes.   No ragged red fibers. NO rimmed vacuolae      Review of Systems   Constitutional: Positive for activity change and fatigue.   HENT: Negative.    Eyes: Negative.    Respiratory: Negative.    Cardiovascular: Negative.    Gastrointestinal: Negative.    Endocrine: Negative.    Genitourinary: Negative.    Musculoskeletal: Positive for gait problem and myalgias.   Skin: Negative.    Allergic/Immunologic: Negative.    Neurological: Positive for weakness.   Psychiatric/Behavioral: Negative.        I reviewed patient's past medical/surgical and family/social history.    PAST MEDICAL HX:  Past Medical History:   Diagnosis Date    Arthritis     Atrial fibrillation ([place]     Balance problem     Cancer ([place]     skin cancer    Hypertension     Shellfish allergy 11/22/2015       PAST SURGICAL HX:  Past Surgical History:   Procedure Laterality Date    BIOPSY, BREAST      CESAREAN SECTION      x 3  CHOLECYSTECTOMY      EXCISION, PILONIDAL CYST      HYSTERECTOMY      TONSILLECTOMY         MEDICATIONS: reviewed in EMR  Current Outpatient Medications on File Prior to Visit   Medication Sig Dispense Refill    Alprazolam (XANAX) 0.25 mg Tablet Tablet Take by mouth.      Conjugated Estrogens (PREMARIN) 0.625 mg Tablet Take 0.625 mg by mouth every day.      Digoxin (LANOXIN) 125 mcg Tablet Take 1 tablet by mouth every day. 90 tablet 3    Hydrochlorothiazide (HYDRO-DIURIL) 25 mg Tablet Take 0.5 tablets by mouth every morning. 45 tablet 3    Metoprolol Succinate (TOPROL XL) 50 mg SR Tablet TAKE ONE TABLET BY MOUTH ONE TIME DAILY  90 tablet 3    PRADAXA 150 mg Capsule Take 150 mg by mouth 2 times daily.                  PredniSONE (DELTASONE) 1 mg Tablet Take 1 mg by  mouth every morning after a meal.      SYNTHROID 125 mcg Tablet Take 125 mcg by mouth every morning before a meal.                   No current facility-administered medications on file prior to visit.          Immunization:    There is no immunization history on file for this patient.    ALLERGY:  Allergies   Allergen Reactions    Ciprofloxacin Unknown-Explain in Comments    Epinephrine Tachycardia    Erythromycin N/A    Shellfish Containing Products Angioedema    Tetracycline Unknown-Explain in Comments        SOCIAL HX:  Social History     Socioeconomic History    Marital status: MARRIED     Spouse name: Not on file    Number of children: Not on file    Years of education: Not on file    Highest education level: Not on file   Occupational History    Not on file   Social Needs    Financial resource strain: Not on file    Food insecurity:     Worry: Not on file     Inability: Not on file    Transportation needs:     Medical: Not on file     Non-medical: Not on file   Tobacco Use    Smoking status: Former Smoker     Packs/day: 0.50     Types: Cigarettes     Last attempt to quit: 04/19/1967     Years since quitting: 51.0    Smokeless tobacco: Never Used   Substance and Sexual Activity    Alcohol use: No    Drug use: No    Sexual activity: Not on file   Lifestyle    Physical activity:     Days per week: Not on file     Minutes per session: Not on file    Stress: Not on file   Relationships    Social connections:     Talks on phone: Not on file     Gets together: Not on file     Attends religious service: Not on file     Active member of club or organization: Not on file     Attends meetings of clubs or organizations: Not on file     Relationship status: Not  on file    Intimate partner violence:     Fear of current or ex partner: Not on file     Emotionally abused: Not on file     Physically abused: Not on file     Forced sexual activity: Not on file   Other Topics Concern    Not on file   Social  History Narrative    Patient is married, 2 children. She is currently retired and used to be a Pharmacist, hospital.        FAMILY HX:  Family History   Problem Relation Name Age of Onset    Other (heart condition) Mother      Other (Sclerosis) Father      Other (lung disease) Brother             OBJECTIVE:   Temp: --  Temp src: --  Pulse: 60 (09/03 1548)  BP: 116/68 (09/03 1548)  Resp: --  SpO2: 96 % (09/03 1548)  Height: --  Weight: 60.8 kg (134 lb) (09/03 1548)    Physical Exam   Constitutional: She is oriented to person, place, and time. She appears well-developed and well-nourished.   HENT:   Head: Normocephalic and atraumatic.   Right Ear: External ear normal.   Left Ear: External ear normal.   Nose: Nose normal.   Mouth/Throat: Oropharynx is clear and moist.   Eyes: Pupils are equal, round, and reactive to light. Conjunctivae and EOM are normal.   Neck: Normal range of motion. Neck supple.   Cardiovascular: Normal rate, regular rhythm, normal heart sounds and intact distal pulses.   Pulmonary/Chest: Effort normal and breath sounds normal.   Abdominal: Soft. Bowel sounds are normal.   Musculoskeletal: Normal range of motion.   Impingement in the right shoulder, normal range of motion the left shoulder normal range of motion in the elbows wrists and hands no effusion in the large joints no synovitis in the peripheral joints gelling in the hips bilaterally, 4 out of 5 muscle strength in the lower extremities normal strength in the upper extremities.   Neurological: She is alert and oriented to person, place, and time.   Skin: Skin is warm and dry.   Psychiatric: She has a normal mood and affect. Her behavior is normal. Judgment and thought content normal.         LABS:  Lab Results   Lab Name Value Date/Time    WBC 6.5 11/11/2017 04:16 PM    WBC 6.7 06/22/2002 10:03 AM    HGB 13.4 11/11/2017 04:16 PM    HGB 13.0 06/22/2002 10:03 AM    HCT 40.1 11/11/2017 04:16 PM    HCT 37.9 06/22/2002 10:03 AM    PLT 284 11/11/2017 04:16  PM    PLT 331 06/22/2002 10:03 AM       Lab Results   Lab Name Value Date/Time    NA 135 01/02/2018 11:01 AM    NA 139 11/09/2015 11:30 AM    K 4.1 01/02/2018 11:01 AM    K 4.0 11/09/2015 11:30 AM    CL 98 01/02/2018 11:01 AM    CL 106 11/09/2015 11:30 AM    CO2 23 (L) 01/02/2018 11:01 AM    CO2 24 11/09/2015 11:30 AM    BUN 17 01/02/2018 11:01 AM    BUN 11 11/09/2015 11:30 AM    CR 0.42 (L) 01/02/2018 11:01 AM    CR 0.45 11/09/2015 11:30 AM    GLU 71 01/02/2018 11:01 AM  GLU 86 11/09/2015 11:30 AM     Lab Results   Lab Name Value Date/Time    AST 38 10/18/2017 03:15 AM    ALT 34 10/18/2017 03:15 AM    ALP 47 10/18/2017 03:15 AM    ALB 2.9 (L) 10/18/2017 03:15 AM    TP 6.3 10/18/2017 03:15 AM    TBIL 0.6 10/18/2017 03:15 AM           Pertinent laboratory data  and imaging on EMR was reviewed and discussed with patient.     ASSESSMENT/PLAN: Brittany Stokes is a 84yrold female who presents to rheumatology clinic for initial evaluation.  1. [place]  Patient is a very pleasant 84year old Caucasian female who has been diagnosed with polymyalgia rheumatica and also inclusion body myositis starting in 2019 and since then she has been on on and off prednisone to some degree to her previous rheumatologist.  Now she is having increasing joint pain and stiffness of the past few weeks.  It is possible that she may be having recurrence of her inflammatory arthropathy versus inflammatory muscle disease.  Is also possible that she may have some amount of adrenal insufficiency because she is been on prednisone which has been discontinued recently.  Advised patient that I would like to repeat her blood work with her cortisol levels and ACTH levels to further insufficiency and also check her CPK and inflammatory markers to see which of her diseases are giving her problems.  It is possible that patient may need to go back on prednisone 10 mg daily to treat her inflammatory arthropathy.  I also believe that the  patient has been on chronic prednisone we may better be just leave her on low-dose prednisone 3 to 4 mg so that she does not become adrenal insufficient.  We discussed her blood work and further plans in 2 weeks.  - CORTISOL; Future  - CORTISOL, SALIVA; Future  - ADRENOCORTICOTROPIC HORMONE; Future  - C-REACTIVE PROTEIN; Future  - SED RATE WESTERGREN; Future  - ALDOLASE; Future  - ANTI-NUCLEAR AB SCREEN (ANA); Future  - ANTI-SCLERODERMA (SCL-70) AB; Future  - SM/RNP AB, IGG; Future  - SS-A/RO AB, IGG; Future  - SS-B/LA AB, IGG; Future  - CYCLIC CITRULLINATED PEPTIDE; Future  - CREATINE KINASE; Future  - ANTI-DNA AB, IGG; Future      A total of 45 minutes were spent with the patient, more than 50 % of the face-to-face time with patient was spent discussing the patient's disease status, potential diagnosis,prognosis, treatment options, side effects, and treatment plan.   No barriers to learning.Patient verbalizes understanding of teaching and instructions.  Patient reminded to always contact me should any concerns develop.        I appreciate being able to assist in this patient's care.    RTC : 2 weeks      Report electronically signed by:  HJacqualin CombesMD  Division of Rheumatology, Allergy and Immunology

## 2018-04-17 ENCOUNTER — Ambulatory Visit: Payer: Medicare Other | Attending: RHEUMATOLOGY

## 2018-04-17 DIAGNOSIS — M353 Polymyalgia rheumatica: Principal | ICD-10-CM | POA: Insufficient documentation

## 2018-04-17 LAB — C-REACTIVE PROTEIN: C-Reactive Protein: 1.2 mg/dL — ABNORMAL HIGH (ref 0.1–0.8)

## 2018-04-17 LAB — CORTISOL: Cortisol: 10 ug/dL

## 2018-04-17 LAB — SED RATE WESTERGREN: Sed Rate Westergren: 25 mm/h (ref 0–30)

## 2018-04-17 LAB — CREATINE KINASE: CREATINE KINASE: 404 U/L — AB (ref 0–250)

## 2018-04-18 LAB — ANTI-SCLERODERMA (SCL-70) AB: Scl 70 Ab, IgG: NEGATIVE

## 2018-04-18 LAB — ANTI-NUCLEAR AB SCREEN (ANA): Anti-Nuclear Ab Screen (ANA): NEGATIVE

## 2018-04-18 LAB — SS-B/LA AB, IGG: SS-B/La Ab, IgG: NEGATIVE

## 2018-04-18 LAB — SS-A/RO AB, IGG: ANTI-SSA AB: NEGATIVE

## 2018-04-18 LAB — CYCLIC CITRULLINATED PEPTIDE: CYCLIC CITRULLINATED PEPTIDE: NEGATIVE

## 2018-04-18 LAB — SM/RNP AB, IGG: Sm/RNP Ab, IgG: NEGATIVE

## 2018-04-18 LAB — ANTI-DNA AB, IGG
ANTI-DNA AB QUALITATIVE: NEGATIVE
ANTI-DNA AB QUANTITATIVE: 4 [IU]/mL (ref <=4)

## 2018-04-18 LAB — ALDOLASE: ALDOLASE: 7.8 U/L (ref 1.5–8.1)

## 2018-04-22 ENCOUNTER — Ambulatory Visit: Payer: Self-pay | Admitting: RHEUMATOLOGY

## 2018-04-23 ENCOUNTER — Ambulatory Visit: Payer: Medicare Other | Admitting: RHEUMATOLOGY

## 2018-04-23 LAB — ADRENOCORTICOTROPIC HORMONE: ACTH,Result: 26 ng/L (ref 6–50)

## 2018-04-30 ENCOUNTER — Telehealth: Payer: Self-pay | Admitting: RHEUMATOLOGY

## 2018-04-30 ENCOUNTER — Ambulatory Visit: Payer: Medicare Other | Admitting: RHEUMATOLOGY

## 2018-04-30 VITALS — BP 108/70 | HR 65 | Wt 134.0 lb

## 2018-04-30 DIAGNOSIS — G7241 Inclusion body myositis [IBM]: Secondary | ICD-10-CM | POA: Insufficient documentation

## 2018-04-30 DIAGNOSIS — M353 Polymyalgia rheumatica: Secondary | ICD-10-CM | POA: Insufficient documentation

## 2018-04-30 MED ORDER — PREDNISONE 1 MG TABLET
5.0000 mg | ORAL_TABLET | Freq: Every day | ORAL | 11 refills | Status: AC
Start: 2018-04-30 — End: 2019-04-25

## 2018-04-30 MED ORDER — PREDNISONE 1 MG TABLET
5.0000 mg | ORAL_TABLET | Freq: Every day | ORAL | 11 refills | Status: DC
Start: 2018-04-30 — End: 2018-04-30

## 2018-04-30 NOTE — Telephone Encounter (Signed)
done

## 2018-04-30 NOTE — Telephone Encounter (Signed)
The patient requested to resend the medication because when she got to the pharmacy she was told they don't have anything from the doctor. Thanks.       PredniSONE (DELTASONE) 1 mg Tablet

## 2018-04-30 NOTE — Progress Notes (Signed)
Rheumatology Initial Consultation Note    Reason for consult : per Referring physician : Muscle pain    Dear Dr. [name], MD   I had the pleasure of seeing Brittany Stokes in consultation in my rheumatology clinic    CC : Muscle pain    HPI : Brittany Stokes, 84yrold female presents to Rheumatology clinic for initial evaluation.   Patient is a very pleasant 84year old Caucasian female accompanied by her spouse.  She reports that she has been diagnosed with inclusion body myositis in 2009.  Patient has seen multiple rheumatologist over the years, along the line she was diagnosed with polymyalgia rheumatica with increased pain and stiffness in her shoulders and thighs with some swelling in her hand was started on prednisone in 2009, the dose at that time was 15 mg in the morning and 5 mg at night and all symptoms became normal within a few days.  Patient was on prednisone for few years and then she went off the prednisone but then she had again a relapse in 2016 16 was put back on the prednisone.      Patient now reports of increased worsening muscle aches and pains and muscle weakness he denies any falls swelling or numbness.  This is had over the past few weeks.  She saw her PCP who did blood work on 0805 to the 19th show normal CBC normal CMP ESR elevated at 52 AST elevated at 38-, UA is normal.  She is denying any jaw pain or temporal headaches.  No rashes.        EMG done 11/28/2007: there is evdience for myopathy, incresed insertional activity and fibs, positive sharp waves for all the muscles of the right upper, lower extremities and paralumbar muscles. ncs minor cts right.   minor abnormality right posterior tibial.     MRI Of the upper leg w/o"  distal portion of vastus lateralis muscles bilaterally symmetrically, increased signal on stir images,     muscle bx from the L thigh:  done 11/12/2007:  left thigh muscle bx: active inflammation with muscle fiber destruction, consistent with inflammatory  myopathy:  -active interstitial inflammatory process  -lymphocytic infiltrates in the endomysium and perimysium of the muscle.   evidence of cell mediated distruction of the muscle fibers that appear degenerated and invaded by lymphocytes and histiocytes.   No ragged red fibers. NO rimmed vacuolae      Interim history:  Patient.  She still having persistent stiffness in her hips and her thighs going to her legs she still has to use her walker regularly, she is not taking any prednisone for about 6 months.  Denies any headaches or jaw pain denies any pain or stiffness in her shoulders elbows or hands.  She has not had any recent fractures.  She reports that she feels weak in her legs over the past 2 to 4 months.    Review of Systems   Constitutional: Positive for activity change and fatigue.   HENT: Negative.    Eyes: Negative.    Respiratory: Negative.    Cardiovascular: Negative.    Gastrointestinal: Negative.    Endocrine: Negative.    Genitourinary: Negative.    Musculoskeletal: Positive for gait problem and myalgias.   Skin: Negative.    Allergic/Immunologic: Negative.    Neurological: Positive for weakness.   Psychiatric/Behavioral: Negative.        I reviewed patient's past medical/surgical and family/social history.    PAST MEDICAL HX:  Past Medical  History:   Diagnosis Date    Arthritis     Atrial fibrillation ([facility])     Balance problem     Cancer ([place])     skin cancer    Hypertension     Shellfish allergy 11/22/2015       PAST SURGICAL HX:  Past Surgical History:   Procedure Laterality Date    BIOPSY, BREAST      CESAREAN SECTION      x 3    CHOLECYSTECTOMY      EXCISION, PILONIDAL CYST      HYSTERECTOMY      TONSILLECTOMY         MEDICATIONS: reviewed in EMR  Current Outpatient Medications on File Prior to Visit   Medication Sig Dispense Refill    Alprazolam (XANAX) 0.25 mg Tablet Tablet Take by mouth.      Conjugated Estrogens (PREMARIN) 0.625 mg Tablet Take 0.625 mg by mouth every day.       Digoxin (LANOXIN) 125 mcg Tablet Take 1 tablet by mouth every day. 90 tablet 3    Hydrochlorothiazide (HYDRO-DIURIL) 25 mg Tablet Take 0.5 tablets by mouth every morning. 45 tablet 3    Metoprolol Succinate (TOPROL XL) 50 mg SR Tablet TAKE ONE TABLET BY MOUTH ONE TIME DAILY  90 tablet 3    PRADAXA 150 mg Capsule Take 150 mg by mouth 2 times daily.                  PredniSONE (DELTASONE) 1 mg Tablet Take 1 mg by mouth every morning after a meal.      SYNTHROID 125 mcg Tablet Take 125 mcg by mouth every morning before a meal.                   No current facility-administered medications on file prior to visit.          Immunization:    There is no immunization history on file for this patient.    ALLERGY:  Allergies   Allergen Reactions    Ciprofloxacin Unknown-Explain in Comments    Epinephrine Tachycardia    Erythromycin N/A    Shellfish Containing Products Angioedema    Tetracycline Unknown-Explain in Comments        SOCIAL HX:  Social History     Socioeconomic History    Marital status: MARRIED     Spouse name: Not on file    Number of children: Not on file    Years of education: Not on file    Highest education level: Not on file   Occupational History    Not on file   Social Needs    Financial resource strain: Not on file    Food insecurity:     Worry: Not on file     Inability: Not on file    Transportation needs:     Medical: Not on file     Non-medical: Not on file   Tobacco Use    Smoking status: Former Smoker     Packs/day: 0.50     Types: Cigarettes     Last attempt to quit: 04/19/1967     Years since quitting: 51.0    Smokeless tobacco: Never Used   Substance and Sexual Activity    Alcohol use: No    Drug use: No    Sexual activity: Not on file   Lifestyle    Physical activity:     Days per week: Not on file  Minutes per session: Not on file    Stress: Not on file   Relationships    Social connections:     Talks on phone: Not on file     Gets together: Not on file     Attends  religious service: Not on file     Active member of club or organization: Not on file     Attends meetings of clubs or organizations: Not on file     Relationship status: Not on file    Intimate partner violence:     Fear of current or ex partner: Not on file     Emotionally abused: Not on file     Physically abused: Not on file     Forced sexual activity: Not on file   Other Topics Concern    Not on file   Social History Narrative    Patient is married, 2 children. She is currently retired and used to be a Pharmacist, hospital.        FAMILY HX:  Family History   Problem Relation Name Age of Onset    Other (heart condition) Mother      Other (Sclerosis) Father      Other (lung disease) Brother             OBJECTIVE:   Temp: --  Temp src: --  Pulse: 65 (09/18 1043)  BP: 108/70 (09/18 1043)  Resp: --  SpO2: 98 % (09/18 1043)  Height: --  Weight: 60.8 kg (134 lb) (09/18 1043)    Physical Exam   Constitutional: She is oriented to person, place, and time. She appears well-developed and well-nourished.   HENT:   Head: Normocephalic and atraumatic.   Right Ear: External ear normal.   Left Ear: External ear normal.   Nose: Nose normal.   Mouth/Throat: Oropharynx is clear and moist.   Eyes: Pupils are equal, round, and reactive to light. Conjunctivae and EOM are normal.   Neck: Normal range of motion. Neck supple.   Cardiovascular: Normal rate, regular rhythm, normal heart sounds and intact distal pulses.   Pulmonary/Chest: Effort normal and breath sounds normal.   Abdominal: Soft. Bowel sounds are normal.   Musculoskeletal: Normal range of motion.   Impingement in the right shoulder, normal range of motion the left shoulder normal range of motion in the elbows wrists and hands no effusion in the large joints no synovitis in the peripheral joints gelling in the hips bilaterally, 4 out of 5 muscle strength in the lower extremities normal strength in the upper extremities.   Neurological: She is alert and oriented to person, place,  and time.   Skin: Skin is warm and dry.   Psychiatric: She has a normal mood and affect. Her behavior is normal. Judgment and thought content normal.         LABS:  Lab Results   Lab Name Value Date/Time    WBC 6.5 11/11/2017 04:16 PM    WBC 6.7 06/22/2002 10:03 AM    HGB 13.4 11/11/2017 04:16 PM    HGB 13.0 06/22/2002 10:03 AM    HCT 40.1 11/11/2017 04:16 PM    HCT 37.9 06/22/2002 10:03 AM    PLT 284 11/11/2017 04:16 PM    PLT 331 06/22/2002 10:03 AM       Lab Results   Lab Name Value Date/Time    NA 135 01/02/2018 11:01 AM    NA 139 11/09/2015 11:30 AM    K 4.1 01/02/2018 11:01 AM  K 4.0 11/09/2015 11:30 AM    CL 98 01/02/2018 11:01 AM    CL 106 11/09/2015 11:30 AM    CO2 23 (L) 01/02/2018 11:01 AM    CO2 24 11/09/2015 11:30 AM    BUN 17 01/02/2018 11:01 AM    BUN 11 11/09/2015 11:30 AM    CR 0.42 (L) 01/02/2018 11:01 AM    CR 0.45 11/09/2015 11:30 AM    GLU 71 01/02/2018 11:01 AM    GLU 86 11/09/2015 11:30 AM     Lab Results   Lab Name Value Date/Time    AST 38 10/18/2017 03:15 AM    ALT 34 10/18/2017 03:15 AM    ALP 47 10/18/2017 03:15 AM    ALB 2.9 (L) 10/18/2017 03:15 AM    TP 6.3 10/18/2017 03:15 AM    TBIL 0.6 10/18/2017 03:15 AM           Pertinent laboratory data  and imaging on EMR was reviewed and discussed with patient.     ASSESSMENT/PLAN: Brittany Stokes is a 84yrold female who presents to rheumatology clinic for initial evaluation.  1. [place]umatica ([place]  Patient is a very pleasant 84year old Caucasian female who has been diagnosed with polymyalgia rheumatica and also inclusion body myositis starting in 2019 and since then she has been on on and off prednisone to some degree to her previous rheumatologist.  Now she is having increasing joint pain and stiffness of the past few weeks.  It is possible that she may be having recurrence of her inflammatory arthropathy versus inflammatory muscle disease.  Is also possible that she may have some amount of adrenal insufficiency because she is  been on prednisone which has been discontinued recently.  Advised patient that I would like to repeat her blood work with her cortisol levels and ACTH levels to further insufficiency and also check her CPK and inflammatory markers to see which of her diseases are giving her problems.  It is possible that patient may need to go back on prednisone 10 mg daily to treat her inflammatory arthropathy.  I also believe that the patient has been on chronic prednisone we may better be just leave her on low-dose prednisone 3 to 4 mg so that she does not become adrenal insufficient.  We discussed her blood work and further plans in 2 weeks.    Today's evaluation  Patient will continue with the patient did show that her CRP level, her CPK level also somewhat elevated but she has had elevated CPK level for more than 10 years.  Advised her that this is most likely a relapse of her polymyalgia rheumatica  Started her prednisone 5 mg daily advised her to call immediately to see if her stiffness pain and weakness is improved.  Patient will try to decrease her prednisone dose to 1 mg daily however patient should not get off the prednisone completely she should most likely be on prednisone to 3 mg daily for rest of her.  Patient does have history of inclusion body myositis and her CPK levels are again elevated however patient symptoms are more consistent with PMR therefore I am not pushing for any new muscle biopsy or any other immunosuppressive therapy.      A total of 25 minutes were spent with the patient, more than 50 % of the face-to-face time with patient was spent discussing the patient's disease status, potential diagnosis,prognosis, treatment options, side effects, and treatment plan.   No barriers to learning.Patient verbalizes  understanding of teaching and instructions.  Patient reminded to always contact me should any concerns develop.        I appreciate being able to assist in this patient's care.    RTC : 12  weeks      Report electronically signed by:  Jacqualin Combes MD  Division of Rheumatology, Allergy and Immunology

## 2018-05-12 ENCOUNTER — Ambulatory Visit: Payer: Medicare Other | Attending: Cardiovascular Disease | Admitting: Cardiovascular Disease

## 2018-05-12 ENCOUNTER — Ambulatory Visit (HOSPITAL_BASED_OUTPATIENT_CLINIC_OR_DEPARTMENT_OTHER): Payer: Medicare Other

## 2018-05-12 ENCOUNTER — Encounter: Payer: Self-pay | Admitting: Cardiovascular Disease

## 2018-05-12 VITALS — BP 143/67 | HR 68 | Ht 60.0 in | Wt 135.6 lb

## 2018-05-12 DIAGNOSIS — Z45018 Encounter for adjustment and management of other part of cardiac pacemaker: Secondary | ICD-10-CM | POA: Insufficient documentation

## 2018-05-12 DIAGNOSIS — I10 Essential (primary) hypertension: Secondary | ICD-10-CM | POA: Insufficient documentation

## 2018-05-12 DIAGNOSIS — Z950 Presence of cardiac pacemaker: Secondary | ICD-10-CM | POA: Insufficient documentation

## 2018-05-12 DIAGNOSIS — R9431 Abnormal electrocardiogram [ECG] [EKG]: Secondary | ICD-10-CM | POA: Insufficient documentation

## 2018-05-12 DIAGNOSIS — I349 Nonrheumatic mitral valve disorder, unspecified: Principal | ICD-10-CM | POA: Insufficient documentation

## 2018-05-12 DIAGNOSIS — I482 Chronic atrial fibrillation: Secondary | ICD-10-CM | POA: Insufficient documentation

## 2018-05-12 DIAGNOSIS — I495 Sick sinus syndrome: Secondary | ICD-10-CM | POA: Insufficient documentation

## 2018-05-12 NOTE — Nursing Note (Signed)
Vitals were taken, screened for pain, pharmacy and allergies verified. EKG performed.    Kealy Lewter, MA

## 2018-05-13 LAB — POC ELECTROCARDIOGRAM WITH RHYTHM STRIP: QTC: 392
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
## 2018-05-14 NOTE — Progress Notes (Signed)
Brittany Stokes was seen in [facility] clinic today and had requested a device check today.  Patient has a [place] [id number] dual chamber pacemaker that was implanted by Dr. Davina Poke, now followed by Dr. [name].    Device function is normal, Lead function is normal  Battery status is MOL with estimated longevity of 10 months.  Underlying rhythm: atrial fibrillation-Vs at 42-50 bpm    Arrhythmias/Comments:   1 AMS episode with atrial burden of 100% for 365 days. EGMs showed atrial fibrillation/ atrial flutter.   22 NSVT episode which 3 EGMs showed atrial fibrillation with rapid ventricular response at 160-185 bpm.      Brittany Deed MD was notified of results.  Next scheduled office visit is one year.I ordered her a new Latitude remote transmitter with a cell adapter today.   Interrogated by: Tenna Delaine, HLT II    [name] MD, PH.D, F.A.C.C., F.A.S.E.  [place]  Department of Internal Medicine, Division of Cardiology    Current Outpatient Medications:     Alprazolam (XANAX) 0.25 mg Tablet Tablet, Take by mouth., Disp: , Rfl:     Conjugated Estrogens (PREMARIN) 0.625 mg Tablet, Take 0.625 mg by mouth every day., Disp: , Rfl:     Digoxin (LANOXIN) 125 mcg Tablet, Take 1 tablet by mouth every day., Disp: 90 tablet, Rfl: 3    Hydrochlorothiazide (HYDRO-DIURIL) 25 mg Tablet, Take 0.5 tablets by mouth every morning., Disp: 45 tablet, Rfl: 3    Metoprolol Succinate (TOPROL XL) 50 mg SR Tablet, TAKE ONE TABLET BY MOUTH ONE TIME DAILY , Disp: 90 tablet, Rfl: 3    PRADAXA 150 mg Capsule, Take 150 mg by mouth 2 times daily.        , Disp: , Rfl:     PredniSONE (DELTASONE) 1 mg Tablet, Take 1 mg by mouth every morning after a meal., Disp: , Rfl:     PredniSONE (DELTASONE) 1 mg Tablet, Take 5 tablets by mouth every morning after a meal., Disp: 150 tablet, Rfl: 11    SYNTHROID 125 mcg Tablet, Take 125 mcg by mouth every morning before a meal.        , Disp: , Rfl:

## 2018-05-14 NOTE — Progress Notes (Signed)
Date of Service: 05/12/2018    Following MD: Dr. [name]  Implanting MD: Dr. Aileen Fass  Referring MD: Dr. Zenaida Deed    Diagnosis: I49.5 Sick Sinus Syndrome / Brady-Tachy    Device:  [place] [id number] ADVANTIO  Implant date: 10/16/2011  RA Lead: [place] Model 4469 [place] II Sterox EZ  Implant date: 10/16/2011   RV Lead: [place] Model 4470 [place] II Sterox EZ  Implant date: 10/16/2011     Settings:  Mode DDDR  Lower rate 60 bpm  Max tracking rate 130 bpm  Max sensor rate 130 bpm  AV delay 350.0/350.0 ms  RA sensing sensitivity 0.2 mV  RV sensing sensitivity 0.6 mV  RA pacing polarity: Bipolar  RV pacing polarity: Bipolar  RA Sense polarity: Bipolar  RV Sense polarity: Bipolar  RA pacing: Amplitude 2.0 V @ Pulse Width 0.4 ms  RV pacing: Amplitude 1.1 V @ Pulse Width 0.4 ms    RA Test:  RA impedance 566.0 ohms  RA sensing 2.8 mV    RV Test:  RV impedance 482.0 ohms  RV sensing 23.4 mV  RV pacing threshold: Amplitude 0.5000 V @ Pulse Width 0.4 ms    Battery:  Battery status BOS    Percent Paced:  RA percent paced 0 %  RV percent paced 71.0 %

## 2018-05-17 NOTE — Progress Notes (Signed)
I have personally reviewed the report and agree with the findings.

## 2018-05-26 ENCOUNTER — Telehealth: Payer: Self-pay | Admitting: Cardiovascular Disease

## 2018-05-26 MED ORDER — METOPROLOL SUCCINATE ER 50 MG TABLET,EXTENDED RELEASE 24 HR
50.0000 mg | EXTENDED_RELEASE_TABLET | Freq: Every day | ORAL | 3 refills | Status: AC
Start: 2018-05-26 — End: 2019-05-26

## 2018-05-26 NOTE — Telephone Encounter (Signed)
Medication refilled per Prescription Refill by Clinic RN Standardized Procedure.    Duval Macleod, RN

## 2018-05-26 NOTE — Telephone Encounter (Signed)
Patient is calling stating she needs a refill on her Metoprolol Succinate (TOPROL XL) 50 mg SR Tablet    She is requesting for a 90 day supply called into the [facility] on file        Thank You,  [employer]

## 2018-05-27 ENCOUNTER — Telehealth: Payer: Self-pay | Admitting: Cardiovascular Disease

## 2018-05-28 ENCOUNTER — Encounter: Payer: Self-pay | Admitting: Cardiovascular Disease

## 2018-06-04 ENCOUNTER — Encounter: Payer: Self-pay | Admitting: Cardiovascular Disease

## 2018-06-10 ENCOUNTER — Telehealth: Payer: Self-pay | Admitting: Cardiovascular Disease

## 2018-06-10 MED ORDER — HYDROCHLOROTHIAZIDE 25 MG TABLET
12.5000 mg | ORAL_TABLET | Freq: Every day | ORAL | 3 refills | Status: AC
Start: 2018-06-10 — End: 2019-06-10

## 2018-06-10 NOTE — Telephone Encounter (Signed)
Medication refilled per Prescription Refill by Clinic RN Standardized Procedure.    Auden Wettstein, RN

## 2018-06-10 NOTE — Telephone Encounter (Signed)
Patient needs her Hydrochlorothiazide prescription sent to new pharmacy, [facility] [phone].

## 2018-06-18 ENCOUNTER — Ambulatory Visit: Payer: Medicare Other | Admitting: RHEUMATOLOGY

## 2018-06-18 VITALS — BP 102/64 | HR 63 | Wt 134.0 lb

## 2018-06-18 DIAGNOSIS — M353 Polymyalgia rheumatica: Principal | ICD-10-CM

## 2018-06-18 NOTE — Progress Notes (Signed)
Latitude Remote reviewed on 05/28/18.  Brittany Stokes had unscheduled remote transmission: for SAM, MV sensor switch.  Patient has a [employer] representative. Implanted by Dr. Davina Poke for sick sinus syndrome/Tachy-Brady.  [name] MD follows device.    Device function is normal, Lead function is normal  Battery status is MOS2, with an estimated longevity of 7 months.   Presenting EGM:  AS/VS-VP atrial fibrillation    Arrhythmias/Comments: Since last office visit 05/12/18:  No AMS/ATR episodes  No VT/VF episodes    1 SAM, MV sensor switch episode with 1 EGM showing atrial fibrillation with ventricular rate of 80-130.      Next scheduled remote is January and office visit is September 2020.   Interrogated by: Jamelle Haring, CCT, HLT II     [name] MD, PH.D, F.A.C.C., F.A.S.E.  [place]  Department of Internal Medicine, Division of Cardiology  (HLT II supervising)    Current Outpatient Medications on File Prior to Visit   Medication Sig Dispense Refill    Alprazolam (XANAX) 0.25 mg Tablet Tablet Take by mouth.      Conjugated Estrogens (PREMARIN) 0.625 mg Tablet Take 0.625 mg by mouth every day.      Digoxin (LANOXIN) 125 mcg Tablet Take 1 tablet by mouth every day. 90 tablet 3    Metoprolol Succinate (TOPROL XL) 50 mg SR Tablet Take 1 tablet by mouth every day. 90 tablet 3    PRADAXA 150 mg Capsule Take 150 mg by mouth 2 times daily.                  PredniSONE (DELTASONE) 1 mg Tablet Take 1 mg by mouth every morning after a meal.      PredniSONE (DELTASONE) 1 mg Tablet Take 5 tablets by mouth every morning after a meal. 150 tablet 11    SYNTHROID 125 mcg Tablet Take 125 mcg by mouth every morning before a meal.                   No current facility-administered medications on file prior to visit.

## 2018-06-18 NOTE — Nursing Note (Signed)
Vital signs obtained, chief complaint identified, allergies verified, screened for pain, med history taken, pharmacy verified.  Brittany Vilchis, MA I

## 2018-06-18 NOTE — Progress Notes (Signed)
Date of Service: 05/28/2018    Following MD: Dr. [name]  Implanting MD: Dr. Aileen Fass  Referring MD: Dr. Zenaida Deed    Diagnosis: I49.5 Sick Sinus Syndrome / Brady-Tachy    Device:  [place] [id number] ADVANTIO  Implant date: 10/16/2011  RA Lead: [place] Model 4469 [place] II Sterox EZ  Implant date: 10/16/2011   RV Lead: [place] Model 4470 [place] II Sterox EZ  Implant date: 10/16/2011   Underlying rhythm: AS/VS-VP  atrial fibrillation    Settings:  Mode DDDR  Lower rate 60 bpm  Max tracking rate 130 bpm  Max sensor rate 130 bpm  AV delay 350/350 ms  RA sensing sensitivity 0.2 mV  RV sensing sensitivity 0.6 mV  RA pacing polarity: Bipolar  RV pacing polarity: Bipolar  RA Sense polarity: Bipolar  RV Sense polarity: Bipolar  RA pacing: Amplitude 2.0 V @ Pulse Width 0.4 ms  RV pacing: Amplitude 1.1 V @ Pulse Width 0.4 ms    RA Test:  RA impedance 535 ohms    RV Test:  RV impedance 451 ohms  RV pacing threshold: Amplitude 0.6 V @ Pulse Width 0.4 ms    Battery:  Battery status MOS  Battery remaining longevity 7 months  Battery at 11%     Percent Paced:  RA percent paced 0 %  RV percent paced 73 %

## 2018-06-18 NOTE — Progress Notes (Signed)
Rheumatology Initial Consultation Note    Reason for consult : per Referring physician : Muscle pain    Dear Dr. [name], MD   I had the pleasure of seeing Brittany Stokes in consultation in my rheumatology clinic    CC : Muscle pain    HPI : Brittany Stokes, 84yrold female presents to Rheumatology clinic for initial evaluation.   Patient is a very pleasant 84year old Caucasian female accompanied by her spouse.  She reports that she has been diagnosed with inclusion body myositis in 2009.  Patient has seen multiple rheumatologist over the years, along the line she was diagnosed with polymyalgia rheumatica with increased pain and stiffness in her shoulders and thighs with some swelling in her hand was started on prednisone in 2009, the dose at that time was 15 mg in the morning and 5 mg at night and all symptoms became normal within a few days.  Patient was on prednisone for few years and then she went off the prednisone but then she had again a relapse in 2016 16 was put back on the prednisone.      Patient now reports of increased worsening muscle aches and pains and muscle weakness he denies any falls swelling or numbness.  This is had over the past few weeks.  She saw her PCP who did blood work on 0805 to the 19th show normal CBC normal CMP ESR elevated at 52 AST elevated at 38-, UA is normal.  She is denying any jaw pain or temporal headaches.  No rashes.        EMG done 11/28/2007: there is evdience for myopathy, incresed insertional activity and fibs, positive sharp waves for all the muscles of the right upper, lower extremities and paralumbar muscles. ncs minor cts right.   minor abnormality right posterior tibial.     MRI Of the upper leg w/o"  distal portion of vastus lateralis muscles bilaterally symmetrically, increased signal on stir images,     muscle bx from the L thigh:  done 11/12/2007:  left thigh muscle bx: active inflammation with muscle fiber destruction, consistent with inflammatory  myopathy:  -active interstitial inflammatory process  -lymphocytic infiltrates in the endomysium and perimysium of the muscle.   evidence of cell mediated distruction of the muscle fibers that appear degenerated and invaded by lymphocytes and histiocytes.   No ragged red fibers. NO rimmed vacuolae      Last visit history:  Patient.  She still having persistent stiffness in her hips and her thighs going to her legs she still has to use her walker regularly, she is not taking any prednisone for about 6 months.  Denies any headaches or jaw pain denies any pain or stiffness in her shoulders elbows or hands.  She has not had any recent fractures.  She reports that she feels weak in her legs over the past 2 to 4 months.    Interim history:  Patient reports that since her last visit she is still having persistent weakness in her arms and legs bilaterally he decrease her prednisone dose down to 2 mg daily because she feels very anxious on it.  She fell about 3 months ago hit her head she went to the ER where she did a CT scan and CT scan was normal.  She reported that she tried physical therapy but still feels weak, she is using a walker regularly now she does not like to do.  Requesting neurology consult to make sure she did  not have a stroke.        Review of Systems   Constitutional: Positive for activity change and fatigue.   HENT: Negative.    Eyes: Negative.    Respiratory: Negative.    Cardiovascular: Negative.    Gastrointestinal: Negative.    Endocrine: Negative.    Genitourinary: Negative.    Musculoskeletal: Positive for gait problem and myalgias.   Skin: Negative.    Allergic/Immunologic: Negative.    Neurological: Positive for weakness.   Psychiatric/Behavioral: Negative.        I reviewed patient's past medical/surgical and family/social history.    PAST MEDICAL HX:  Past Medical History:   Diagnosis Date    Arthritis     Atrial fibrillation ([place])     Balance problem     Cancer ([place])     skin cancer     Hypertension     Shellfish allergy 11/22/2015       PAST SURGICAL HX:  Past Surgical History:   Procedure Laterality Date    BIOPSY, BREAST      CESAREAN SECTION      x 3    CHOLECYSTECTOMY      EXCISION, PILONIDAL CYST      HYSTERECTOMY      TONSILLECTOMY         MEDICATIONS: reviewed in EMR  Current Outpatient Medications on File Prior to Visit   Medication Sig Dispense Refill    Alprazolam (XANAX) 0.25 mg Tablet Tablet Take by mouth.      Conjugated Estrogens (PREMARIN) 0.625 mg Tablet Take 0.625 mg by mouth every day.      Digoxin (LANOXIN) 125 mcg Tablet Take 1 tablet by mouth every day. 90 tablet 3    Hydrochlorothiazide (HYDRO-DIURIL) 25 mg Tablet Take 0.5 tablets by mouth every morning. 45 tablet 3    Metoprolol Succinate (TOPROL XL) 50 mg SR Tablet Take 1 tablet by mouth every day. 90 tablet 3    PRADAXA 150 mg Capsule Take 150 mg by mouth 2 times daily.                  PredniSONE (DELTASONE) 1 mg Tablet Take 1 mg by mouth every morning after a meal.      PredniSONE (DELTASONE) 1 mg Tablet Take 5 tablets by mouth every morning after a meal. 150 tablet 11    SYNTHROID 125 mcg Tablet Take 125 mcg by mouth every morning before a meal.                   No current facility-administered medications on file prior to visit.          Immunization:    There is no immunization history on file for this patient.    ALLERGY:  Allergies   Allergen Reactions    Ciprofloxacin Unknown-Explain in Comments    Epinephrine Tachycardia    Erythromycin N/A    Shellfish Containing Products Angioedema    Tetracycline Unknown-Explain in Comments        SOCIAL HX:  Social History     Socioeconomic History    Marital status: MARRIED     Spouse name: Not on file    Number of children: Not on file    Years of education: Not on file    Highest education level: Not on file   Occupational History    Not on file   Social Needs    Financial resource strain: Not on file    Food insecurity:  Worry: Not on file      Inability: Not on file    Transportation needs:     Medical: Not on file     Non-medical: Not on file   Tobacco Use    Smoking status: Former Smoker     Packs/day: 0.50     Types: Cigarettes     Last attempt to quit: 04/19/1967     Years since quitting: 51.2    Smokeless tobacco: Never Used   Substance and Sexual Activity    Alcohol use: No    Drug use: No    Sexual activity: Not on file   Lifestyle    Physical activity:     Days per week: Not on file     Minutes per session: Not on file    Stress: Not on file   Relationships    Social connections:     Talks on phone: Not on file     Gets together: Not on file     Attends religious service: Not on file     Active member of club or organization: Not on file     Attends meetings of clubs or organizations: Not on file     Relationship status: Not on file    Intimate partner violence:     Fear of current or ex partner: Not on file     Emotionally abused: Not on file     Physically abused: Not on file     Forced sexual activity: Not on file   Other Topics Concern    Not on file   Social History Narrative    Patient is married, 2 children. She is currently retired and used to be a Pharmacist, hospital.        FAMILY HX:  Family History   Problem Relation Name Age of Onset    Other (heart condition) Mother      Other (Sclerosis) Father      Other (lung disease) Brother             OBJECTIVE:   Temp: --  Temp src: --  Pulse: 63 (11/06 1043)  BP: 102/64 (11/06 1043)  Resp: --  SpO2: 98 % (11/06 1043)  Height: --  Weight: 60.8 kg (134 lb) (11/06 1043)    Physical Exam  Constitutional:       Appearance: She is well-developed.   HENT:      Head: Normocephalic and atraumatic.      Right Ear: External ear normal.      Left Ear: External ear normal.      Nose: Nose normal.   Eyes:      Conjunctiva/sclera: Conjunctivae normal.      Pupils: Pupils are equal, round, and reactive to light.   Neck:      Musculoskeletal: Normal range of motion and neck supple.   Cardiovascular:       Rate and Rhythm: Normal rate and regular rhythm.      Heart sounds: Normal heart sounds.   Pulmonary:      Effort: Pulmonary effort is normal.      Breath sounds: Normal breath sounds.   Abdominal:      General: Bowel sounds are normal.      Palpations: Abdomen is soft.   Musculoskeletal: Normal range of motion.      Comments: Impingement in the right shoulder, normal range of motion the left shoulder normal range of motion in the elbows wrists and hands no effusion in the large  joints no synovitis in the peripheral joints gelling in the hips bilaterally, 4 out of 5 muscle strength in the lower extremities normal strength in the upper extremities.   Skin:     General: Skin is warm and dry.   Neurological:      Mental Status: She is alert and oriented to person, place, and time.   Psychiatric:         Behavior: Behavior normal.         Thought Content: Thought content normal.         Judgment: Judgment normal.           LABS:  Lab Results   Lab Name Value Date/Time    WBC 6.5 11/11/2017 04:16 PM    WBC 6.7 06/22/2002 10:03 AM    HGB 13.4 11/11/2017 04:16 PM    HGB 13.0 06/22/2002 10:03 AM    HCT 40.1 11/11/2017 04:16 PM    HCT 37.9 06/22/2002 10:03 AM    PLT 284 11/11/2017 04:16 PM    PLT 331 06/22/2002 10:03 AM       Lab Results   Lab Name Value Date/Time    NA 135 01/02/2018 11:01 AM    NA 139 11/09/2015 11:30 AM    K 4.1 01/02/2018 11:01 AM    K 4.0 11/09/2015 11:30 AM    CL 98 01/02/2018 11:01 AM    CL 106 11/09/2015 11:30 AM    CO2 23 (L) 01/02/2018 11:01 AM    CO2 24 11/09/2015 11:30 AM    BUN 17 01/02/2018 11:01 AM    BUN 11 11/09/2015 11:30 AM    CR 0.42 (L) 01/02/2018 11:01 AM    CR 0.45 11/09/2015 11:30 AM    GLU 71 01/02/2018 11:01 AM    GLU 86 11/09/2015 11:30 AM     Lab Results   Lab Name Value Date/Time    AST 38 10/18/2017 03:15 AM    ALT 34 10/18/2017 03:15 AM    ALP 47 10/18/2017 03:15 AM    ALB 2.9 (L) 10/18/2017 03:15 AM    TP 6.3 10/18/2017 03:15 AM    TBIL 0.6 10/18/2017 03:15 AM            Pertinent laboratory data  and imaging on EMR was reviewed and discussed with patient.     ASSESSMENT/PLAN: Brittany Stokes is a 84yrold female who presents to rheumatology clinic for initial evaluation.  1. [place]  Patient is a very pleasant 84year old Caucasian female who has been diagnosed with polymyalgia rheumatica and also inclusion body myositis starting in 2019 and since then she has been on on and off prednisone to some degree to her previous rheumatologist.  Now she is having increasing joint pain and stiffness of the past few weeks.  It is possible that she may be having recurrence of her inflammatory arthropathy versus inflammatory muscle disease.  Is also possible that she may have some amount of adrenal insufficiency because she is been on prednisone which has been discontinued recently.  Advised patient that I would like to repeat her blood work with her cortisol levels and ACTH levels to further insufficiency and also check her CPK and inflammatory markers to see which of her diseases are giving her problems.  It is possible that patient may need to go back on prednisone 10 mg daily to treat her inflammatory arthropathy.  I also believe that the patient has been on chronic prednisone we may better be just leave  her on low-dose prednisone 3 to 4 mg so that she does not become adrenal insufficient.  We discussed her blood work and further plans in 2 weeks.    Today's evaluation  Patient will continue with the patient did show that her CRP level, her CPK level also somewhat elevated but she has had elevated CPK level for more than 10 years.  Advised her that this is most likely a relapse of her polymyalgia rheumatica  Patient will stay on the prednisone 2 mg for now.  Also sending patient to neurology to see if she can be reevaluated for a stroke versus possibly myositis patient cannot get an MRI but we may consider doing an EMG as that may help [place] make a better  diagnosis.  And somewhat apprehensive about starting patient on any immunosuppressive therapy at this age.      A total of 25 minutes were spent with the patient, more than 50 % of the face-to-face time with patient was spent discussing the patient's disease status, potential diagnosis,prognosis, treatment options, side effects, and treatment plan.   No barriers to learning.Patient verbalizes understanding of teaching and instructions.  Patient reminded to always contact me should any concerns develop.        I appreciate being able to assist in this patient's care.    RTC : 12 weeks      Report electronically signed by:  Jacqualin Combes MD  Division of Rheumatology, Allergy and Immunology

## 2018-06-21 NOTE — Progress Notes (Signed)
I have personally reviewed the report and agree with the findings.

## 2018-06-24 ENCOUNTER — Telehealth: Payer: Self-pay | Admitting: Cardiovascular Disease

## 2018-06-24 MED ORDER — DIGOXIN 125 MCG (0.125 MG) TABLET
0.1250 mg | ORAL_TABLET | Freq: Every day | ORAL | 3 refills | Status: AC
Start: 2018-06-24 — End: 2019-06-24

## 2018-06-24 NOTE — Telephone Encounter (Signed)
Medication refilled per Prescription Refill by Clinic RN Standardized Procedure.    Spoke to patient, advised that brand name may be a higher copay. Asked her to call us back if she decides she wants the generic.    Lindie Spruce, RN

## 2018-06-24 NOTE — Telephone Encounter (Signed)
Patient called. She states she was advised by the pharmacy to call here and ask for the refill to her Digoxin. She is now using [facility] representative in [facility]. She said the Digoxin refill use to go to the [place] that she is no longer using. She would like a 90 day supply. She would also prefer the brand name. Please call back and advise.

## 2018-07-08 ENCOUNTER — Encounter: Payer: Self-pay | Admitting: Neurology

## 2018-07-08 ENCOUNTER — Ambulatory Visit: Payer: Medicare Other | Admitting: Neurology

## 2018-07-08 VITALS — BP 132/88 | HR 70 | Temp 97.4°F | Ht 64.0 in | Wt 137.4 lb

## 2018-07-08 DIAGNOSIS — Z950 Presence of cardiac pacemaker: Secondary | ICD-10-CM | POA: Insufficient documentation

## 2018-07-08 DIAGNOSIS — G7241 Inclusion body myositis [IBM]: Principal | ICD-10-CM

## 2018-07-08 DIAGNOSIS — I4891 Unspecified atrial fibrillation: Secondary | ICD-10-CM | POA: Insufficient documentation

## 2018-07-08 DIAGNOSIS — M353 Polymyalgia rheumatica: Secondary | ICD-10-CM

## 2018-07-08 NOTE — Progress Notes (Signed)
[facility]  NEUROLOGY    OUTPATIENT NEUROLOGY CONSULTATION    PATIENT: Brittany Stokes  MRN: [id number]  SEX: female  AGE: 12yr  DOB: Sep 16, 1932    DATE OF SERVICE: 07/08/2018    PRIMARY CARE PHYSICIAN: [name], MD      CHIEF COMPLAINT:   Weakness    Consultation     Requested by Laure Kidney, MD     HISTORY OF PRESENT ILLNESS:   Brittany Stokes is a 84yr old right-handed woman who is seen today in neurological consultation at the request of Dr. Laure Kidney of rheumatology regarding muscle disease. Brittany Stokes is accompanied today by her husband, Richardson Landry.    For a duration of at least 15 years, Brittany Stokes has been bothered by weakness. Her hands and arms are especially weak but there is also weakness located in the lower extremities. With respect to severity, the weakness is severe in the upper extremities, especially distally, and somewhat less severe in the lower extremities. There is also slurred speech. The timing is constant, nonfluctuating, and very slowly progressive. Modifying factors are absent. Prior diagnostic workup evidently included EMG in 2009 which demonstrated myopathic discharges in multiple muscles and muscle biopsy which demonstrated inflammatory infiltrates. She saw neuromuscular specialists at [facility] and [facility] who felt that she has inclusion body myositis. Brittany Stokes was also diagnosed with polymyalgia rheumatica which was felt to be a separate unrelated problem and her daughter also had autoimmune disorders including lupus and scleroderma. Brittany Stokes presents today because she wants to maintain as much quality of life as possible.     PAST MEDICAL HISTORY:  Polymyalgia rheumatica, mitral valve regurgitation, atrial fibrillation, cardiac pacemaker placement.    MEDICATIONS:  Alprazolam, conjugated estrogens, digoxin, hydrochlorothiazide, metoprolol, dabigatran, prednisone 1 mg daily, Synthroid.     SOCIAL HISTORY:  Nonsmoker. No alcohol  abuse.    FAMILY HISTORY:  No known family history of neuromuscular disease.  Daughter deceased, had lupus and scleroderma.    REVIEW OF SYSTEMS:  All systems reviewed on 07/08/2018 and all systems are negative or noncontributory except as mentioned in the history of present illness.    PHYSICAL EXAMINATION:  Vital signs: BP 132/88 (SITE: left arm, Orthostatic Position: sitting, Cuff Size: regular)   Pulse 70   Temp 36.3 C (97.4 F) (Tympanic)   Ht 1.626 m (5\' 4" )   Wt 62.3 kg (137 lb 6.4 oz)   SpO2 99%   BMI 23.58 kg/m   General appearance: Patient is sitting comfortably, in no acute respiratory distress.  Eyes: Anicteric.  Ears, nose, mouth, and throat: No tongue trauma or lip trauma.  Musculoskeletal: Neck supple. No joint effusions noted.  Cardiovascular: Extremities warm and well-perfused. Pulse regular.  Skin: No jaundice.  Psychiatric: Normal mood and affect.  Neurological: Alert. Speech fluent but slightly slurred. Comprehension intact. Visual fields full to confrontation. PERRL. EOMI. No spontaneous or gaze evoked nystagmus. Facial sensation intact. Facial muscle strength normal but possible mild bilateral blepharoptosis. Not overtly hard of hearing. Palate elevation normal. Sternocleidomastoid function and shoulder shrug are probably slightly weak. Neck flexion slightly weak. Tongue protrudes in midline. No tongue fasciculations. Reflexes absent throughout. Babinski sign absent. Motor power 3/5 in finger flexors and wrist flexors but weaker on left, 4/5 bilateral biceps and triceps and deltoids, 4/5 bilateral hip flexors, 5/5 distal lower extremities. Sensation diminished in a bilateral stocking distribution to vibration and pinprick and temperature. Sit to stand transfers are effortful. Gait is paretic, stable with a  walker.    OTHER DATA:  Old records reviewed:  I reviewed prior records which indicate that [facility] had neurological consultations by Erling Conte, MD, at [facility] on 12/22/2007 and by  Guinevere Scarlet, MD, at [facility] on 02/05/2008. Savannaha's clinical presentation was felt to be consistent with inclusion body myositis.    Laboratory results:  Lab Results   Lab Name Value Date/Time    CK 404 (H) 04/17/2018 08:08 AM    CK 296 (H) 10/17/2017 06:42 PM     Medicine test results:  Electrocardiogram 05/12/2018: Atrial fibrillation with a demand pacemaker.    IMPRESSION/PLAN:   (G72.41) Inclusion body myositis  (primary encounter diagnosis)  Comment: New problem to this examiner; no additional workup planned.   Plan: [place] for consultation with a neuromuscular specialist. PM&R PHYSICIAN REFERRAL to establish care in [facility]. SPEECH THERAPY REFERRAL.    (M35.3) Polymyalgia rheumatica ([facility])  Comment: Established problem; stable.   Plan: Follow up with rheumatologist.        Electronically signed by Bennett Scrape. Candace Cruise, M.D.   [facility]  [address]  [place], [place] [address]  [phone]

## 2018-07-08 NOTE — Nursing Note (Signed)
Patient vitals taken, medication allergies, smoking history, and pain assessment taken.   Dana Dorner, MA

## 2018-07-08 NOTE — Patient Instructions (Addendum)
Inclusion body myositis     Plan:     1. PM&R (PHYSICAL MEDICINE AND REHABILITATION) PHYSICIAN  REFERRAL for "[facility]"  2. NEUROLOGY ALS CLINIC REFERRAL for possible treatment of muscle disease.  3. SPEECH THERAPY REFERRAL.

## 2018-07-09 ENCOUNTER — Telehealth: Payer: Self-pay | Admitting: Neurology

## 2018-07-15 ENCOUNTER — Ambulatory Visit: Payer: Medicare Other | Attending: RHEUMATOLOGY

## 2018-07-15 DIAGNOSIS — M353 Polymyalgia rheumatica: Principal | ICD-10-CM | POA: Insufficient documentation

## 2018-07-15 LAB — COMPREHENSIVE METABOLIC PANEL
ALANINE TRANSFERASE (ALT): 31 U/L (ref 5–54)
ALBUMIN: 3.6 g/dL (ref 3.2–4.6)
ALKALINE PHOSPHATASE (ALP): 53 U/L (ref 35–115)
Aspartate Transaminase (AST): 36 U/L (ref 15–43)
BILIRUBIN TOTAL: 0.8 mg/dL (ref 0.3–1.3)
CALCIUM: 9.8 mg/dL (ref 8.6–10.5)
CARBON DIOXIDE TOTAL: 28 mmol/L (ref 24–32)
CHLORIDE: 97 mmol/L (ref 95–110)
CREATININE BLOOD: 0.39 mg/dL — AB (ref 0.44–1.27)
E-GFR, NON-AFRICAN AMERICAN (FEMALE): 97 mL/min/{1.73_m2}
GLUCOSE: 81 mg/dL (ref 70–99)
POTASSIUM: 3.9 mmol/L (ref 3.3–5.0)
PROTEIN: 7.3 g/dL (ref 6.3–8.3)
SODIUM: 134 mmol/L — AB (ref 135–145)
UREA NITROGEN, BLOOD (BUN): 15 mg/dL (ref 8–22)

## 2018-07-15 LAB — CBC WITH DIFFERENTIAL
BASOPHILS % AUTO: 1.1 %
BASOPHILS ABS AUTO: 0.1 10*3/uL (ref 0.0–0.2)
EOSINOPHIL % AUTO: 2.2 %
EOSINOPHIL ABS AUTO: 0.1 10*3/uL (ref 0.0–0.5)
HEMATOCRIT: 40.1 % (ref 36.0–46.0)
HEMOGLOBIN: 13.9 g/dL (ref 12.0–16.0)
LYMPHOCYTE ABS AUTO: 2.3 10*3/uL (ref 1.0–4.8)
LYMPHOCYTES % AUTO: 47.1 %
MCH: 32.6 pg (ref 27.0–33.0)
MCHC: 34.7 % (ref 32.0–36.0)
MCV: 93.9 fL (ref 80.0–100.0)
MONOCYTES % AUTO: 12.5 %
MONOCYTES ABS AUTO: 0.6 10*3/uL (ref 0.1–0.8)
MPV: 8.1 fL (ref 6.8–10.0)
NEUTROPHIL ABS AUTO: 1.8 10*3/uL (ref 1.8–7.7)
NEUTROPHILS % AUTO: 37.1 %
PLATELET COUNT: 223 10*3/uL (ref 130–400)
RDW: 13.5 % (ref 0.0–14.7)
RED CELL COUNT: 4.27 10*6/uL (ref 4.00–5.20)
WHITE BLOOD CELL COUNT: 5 10*3/uL (ref 4.5–11.0)

## 2018-07-15 LAB — SED RATE WESTERGREN: SED RATE WESTERGREN: 19 mm/h (ref 0–30)

## 2018-07-15 LAB — C-REACTIVE PROTEIN: C-REACTIVE PROTEIN (MULTPLIER 0.1): 0.3 mg/dL (ref 0.1–0.8)

## 2018-07-18 ENCOUNTER — Encounter: Payer: Self-pay | Admitting: Cardiovascular Disease

## 2018-07-22 ENCOUNTER — Encounter: Payer: Self-pay | Admitting: Cardiovascular Disease

## 2018-07-23 ENCOUNTER — Ambulatory Visit: Payer: Medicare Other | Admitting: Neurology

## 2018-08-11 ENCOUNTER — Encounter: Payer: Self-pay | Admitting: Cardiovascular Disease

## 2018-08-11 ENCOUNTER — Ambulatory Visit (HOSPITAL_BASED_OUTPATIENT_CLINIC_OR_DEPARTMENT_OTHER): Payer: Medicare Other | Admitting: Cardiovascular Disease

## 2018-08-11 ENCOUNTER — Ambulatory Visit: Payer: Medicare Other | Attending: Cardiovascular Disease

## 2018-08-11 VITALS — BP 145/74 | HR 60 | Ht 60.0 in | Wt 133.2 lb

## 2018-08-11 DIAGNOSIS — I4891 Unspecified atrial fibrillation: Secondary | ICD-10-CM | POA: Insufficient documentation

## 2018-08-11 DIAGNOSIS — I10 Essential (primary) hypertension: Secondary | ICD-10-CM | POA: Insufficient documentation

## 2018-08-11 DIAGNOSIS — I340 Nonrheumatic mitral (valve) insufficiency: Principal | ICD-10-CM

## 2018-08-11 DIAGNOSIS — I349 Nonrheumatic mitral valve disorder, unspecified: Secondary | ICD-10-CM

## 2018-08-11 DIAGNOSIS — Z950 Presence of cardiac pacemaker: Secondary | ICD-10-CM

## 2018-08-11 DIAGNOSIS — I4821 Permanent atrial fibrillation: Secondary | ICD-10-CM

## 2018-08-11 DIAGNOSIS — R0789 Other chest pain: Secondary | ICD-10-CM

## 2018-08-11 DIAGNOSIS — Z45018 Encounter for adjustment and management of other part of cardiac pacemaker: Secondary | ICD-10-CM | POA: Insufficient documentation

## 2018-08-11 DIAGNOSIS — R9431 Abnormal electrocardiogram [ECG] [EKG]: Secondary | ICD-10-CM | POA: Insufficient documentation

## 2018-08-11 NOTE — Nursing Note (Signed)
Vitals were taken, screened for pain, pharmacy and allergies verified. EKG performed.    Queen Blossom, MA for device clinic

## 2018-08-11 NOTE — Nursing Note (Signed)
The following procedures were done per physician's order: 12 lead EKG performed.  Procedure performed uneventfully.   Patient tolerated well the procedure and results were given to MD for review.     Miria Cappelli, MA II
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
## 2018-08-11 NOTE — Progress Notes (Signed)
Brittany Stokes was seen in the device clinic today prior to her appt with Glynn Octave MD.    Patient has [place] dual chamber pacemaker.Implanted by Nonie Hoyer MD for SSS.    Device function is normal, Lead function is normal  Battery status is MOL 2 with estimated longevity of < .3 months.  Underlying rhythm: Atrial fibrillation with Ventricular rates of 40-50 bpm.     Arrhythmias/Comments:   Atrial burden is 100% for 365 days.   EGMs showed atrial fibrillation/ atrial flutter.   50 NSVT episode which 3 EGMs showed atrial fibrillation with rapid ventricular response at 160-185 bpm. (this was also when the patient was in the hospital at [facility] )    Glynn Octave MD was notified of results. I will send Dr. [name] A message regarding Battery Status.    Interrogated by: Rulon Abide HLT II    [name] MD, PH.D, F.A.C.C., F.A.S.E.  [place]  Department of Internal Medicine, Division of Cardiology

## 2018-08-11 NOTE — Progress Notes (Addendum)
Date of Service: 06/04/2018    Following MD: Dr. [name]  Implanting MD: Dr. Aileen Fass  Referring MD: Dr. Zenaida Deed    Diagnosis: I49.5 Sick Sinus Syndrome / Brady-Tachy    Device:  [place] [id number] ADVANTIO  Implant date: 10/16/2011  RA Lead: [place] Model 4469 [place] II Sterox EZ  Implant date: 10/16/2011   RV Lead: [place] Model 4470 [place] II Sterox EZ  Implant date: 10/16/2011     Settings:  Mode DDDR  Lower rate 60 bpm  Max tracking rate 130 bpm  Max sensor rate 130 bpm  AV delay 350/350 ms  RA sensing sensitivity 0.2 mV  RV sensing sensitivity 0.6 mV  RA pacing polarity: Bipolar  RV pacing polarity: Bipolar  RA Sense polarity: Bipolar  RV Sense polarity: Bipolar  RA pacing: Amplitude 2.0 V @ Pulse Width 0.4 ms  RV pacing: Amplitude 1.1 V @ Pulse Width 0.4 ms    RA Test:  RA impedance 539 ohms  RA pacing threshold: Amplitude 0.5 V @ Pulse Width 0.4 ms    RV Test:  RV impedance 456 ohms  RV pacing threshold: Amplitude 0.6 V @ Pulse Width 0.4 ms    Battery:  Battery status MOS  Battery remaining longevity 6 months    Percent Paced:  RA percent paced 0 %  RV percent paced 73 %

## 2018-08-11 NOTE — Patient Instructions (Addendum)
Plase work on scheduling your stress test and echocardiogram as soon as possible.

## 2018-08-11 NOTE — Progress Notes (Signed)
Lat[employer] Remote, reviewed on 06/04/18  Brittany Stokes had a scheduled remote transmission.   Patient has a Engineer, agricultural pacemaker that was implanted by Dr. Davina Poke.    Device function is normal, Lead function is normal  Battery status is good, estimating 6.0 months  Presenting EGM: AFib/ Vs intermittent Vp at 70-85bpm.    Arrhythmias/Comments: Since last remote on 05/28/18  No ventricular events.    Patient is in AFib 100% of the time and is on Pradaxa, Digoxin and Toprol.    Next scheduled remote is June and office appt is 08/11/18 the same day she sees Dr.Singh  Interrogated by: Brittany Stokes, HLT II    Brittany Bruin MD, PH.D, F.A.C.C., F.A.S.E.  [place]  Department of Internal Medicine, Division of Cardiology

## 2018-08-13 NOTE — Progress Notes (Addendum)
Date of Service: 08/11/2018    Following MD: Dr. [name]  Implanting MD: Dr. Aileen Fass  Referring MD: Dr. Zenaida Deed    Diagnosis: I49.5 Sick Sinus Syndrome / Brady-Tachy    Device:  [place] [id number] ADVANTIO  Implant date: 10/16/2011  RA Lead: [place] Model 4469 [place] II Sterox EZ  Implant date: 10/16/2011   RV Lead: [place] Model 4470 [place] II Sterox EZ  Implant date: 10/16/2011   Underlying rhythm: Afib/Vs at 40bpm    Settings:  Mode DDDR  Lower rate 60 bpm  Max tracking rate 130 bpm  Max sensor rate 130 bpm  AV delay 350.0/350.0 ms  RA sensing sensitivity 0.2 mV  RV sensing sensitivity 0.6 mV  RA pacing polarity: Bipolar  RV pacing polarity: Bipolar  RA Sense polarity: Bipolar  RV Sense polarity: Bipolar  RA pacing: Amplitude 2.0 V @ Pulse Width 0.4 ms  RV pacing: Amplitude 1.1 V @ Pulse Width 0.4 ms    RA Test:  RA impedance 580.0 ohms  RA sensing 1.7 mV  RA pacing threshold: Amplitude 0.5000 V @ Pulse Width 0.4 ms    RV Test:  RV impedance 470.0 ohms  RV sensing 23.2 mV  RV pacing threshold: Amplitude 0.5000 V @ Pulse Width 0.4 ms    Battery:  Battery status One Year Remaining    Percent Paced:  RA percent paced 0 %  RV percent paced 73 %

## 2018-08-14 LAB — POC ELECTROCARDIOGRAM WITH RHYTHM STRIP: QTC: 450

## 2018-08-14 LAB — ELECTROCARDIOGRAM WITH RHYTHM STRIP: QTC: 450

## 2018-08-15 NOTE — Progress Notes (Signed)
Latitude Remote, reviewed on 07/22/18.  [name] had an unscheduled remote transmission for:She thought we didn't call  Her last week, we didn't get her transmission.    Patient has a [place] [phone] dual chamber pacemaker that was implanted by Dr. Davina Poke, now followed by Dr. [name].    Device function is normal, Lead function is normal  Battery status is good, estimated longevity is < 3 months.  Presenting EGM: AFib, Vp.    Vp 721% of the time.    Arrhythmias/Comments:   Pt is in AFib 100% of the time and is on Pradaxa, Digoxin and Toprol.    Next scheduled remote is in March and office visit will be scheduled soon.   Interrogated MG:NOIBBC [place], HLT II    [name] MD, PH.D, F.A.C.C., F.A.S.E.  [place]  Department of Internal Medicine, Division of Cardiology

## 2018-08-15 NOTE — Progress Notes (Signed)
Latitude Remote, reviewed on 07/18/18.  Brittany Stokes had an unscheduled remote transmission to see if the transmitter was working after restarting Animator.    Patient has a Brittany Stokes dual chamber pacemaker that was implanted by Dr. Davina Poke, now followed by Dr. [name].    Device function is normal, Lead function is normal  Battery status is good, estimated longevity is 3 months.  Presenting EGM: AFib, Vp.    Vp 71% of the time.    Arrhythmias/Comments:   Pt is in AFib 100% of the time and is on Pradaxa, Digoxin and Toprol.    Next scheduled remote is June and office visit will be scheduled soon.   Interrogated Brittany Stokes, HLT II    [name] MD, PH.D, F.A.C.C., F.A.S.E.  [place]  Department of Internal Medicine, Division of Cardiology    Current Outpatient Medications:     Alprazolam (XANAX) 0.25 mg Tablet Tablet, Take by mouth., Disp: , Rfl:     Conjugated Estrogens (PREMARIN) 0.625 mg Tablet, Take 0.625 mg by mouth every day., Disp: , Rfl:     Digoxin (LANOXIN) 125 mcg Tablet, Take 1 tablet by mouth every day., Disp: 90 tablet, Rfl: 3    Hydrochlorothiazide (HYDRO-DIURIL) 25 mg Tablet, Take 0.5 tablets by mouth every morning., Disp: 45 tablet, Rfl: 3    Metoprolol Succinate (TOPROL XL) 50 mg SR Tablet, Take 1 tablet by mouth every day., Disp: 90 tablet, Rfl: 3    PRADAXA 150 mg Capsule, Take 150 mg by mouth 2 times daily.        , Disp: , Rfl:     PredniSONE (DELTASONE) 1 mg Tablet, Take 1 mg by mouth every morning after a meal., Disp: , Rfl:     PredniSONE (DELTASONE) 1 mg Tablet, Take 5 tablets by mouth every morning after a meal., Disp: 150 tablet, Rfl: 11    SYNTHROID 125 mcg Tablet, Take 125 mcg by mouth every morning before a meal.        , Disp: , Rfl:

## 2018-08-19 NOTE — Progress Notes (Signed)
I have personally reviewed the report and agree with the findings.

## 2018-08-21 NOTE — Progress Notes (Signed)
I have personally reviewed the report and agree with the findings.

## 2018-08-27 ENCOUNTER — Encounter: Payer: Self-pay | Admitting: Cardiovascular Disease

## 2018-08-27 ENCOUNTER — Inpatient Hospital Stay: Admit: 2018-08-27 | Payer: Self-pay

## 2018-08-29 ENCOUNTER — Ambulatory Visit: Payer: Medicare Other | Attending: Cardiovascular Disease

## 2018-08-29 ENCOUNTER — Ambulatory Visit (HOSPITAL_BASED_OUTPATIENT_CLINIC_OR_DEPARTMENT_OTHER): Payer: Medicare Other | Admitting: Cardiovascular Disease

## 2018-08-29 DIAGNOSIS — I10 Essential (primary) hypertension: Secondary | ICD-10-CM

## 2018-08-29 DIAGNOSIS — I4891 Unspecified atrial fibrillation: Secondary | ICD-10-CM

## 2018-08-29 DIAGNOSIS — I340 Nonrheumatic mitral (valve) insufficiency: Principal | ICD-10-CM | POA: Insufficient documentation

## 2018-08-29 DIAGNOSIS — Z45018 Encounter for adjustment and management of other part of cardiac pacemaker: Secondary | ICD-10-CM | POA: Insufficient documentation

## 2018-08-29 DIAGNOSIS — Z7901 Long term (current) use of anticoagulants: Secondary | ICD-10-CM

## 2018-08-29 DIAGNOSIS — Z950 Presence of cardiac pacemaker: Principal | ICD-10-CM

## 2018-08-29 NOTE — Progress Notes (Signed)
Date of Service: 08/29/2018    Following MD: Dr. [name]  Implanting MD: Dr. Aileen Fass  Referring MD: Dr. Zenaida Deed    Diagnosis: I49.5 Sick Sinus Syndrome / Brady-Tachy    Device:  [place] [id number] ADVANTIO  Implant date: 10/16/2011  RA Lead: [place] Model 4469 [place] II Sterox EZ  Implant date: 10/16/2011   RV Lead: [place] Model 4470 [place] II Sterox EZ  Implant date: 10/16/2011     Settings:  Mode DDDR  Lower rate 60 bpm  Max tracking rate 130 bpm  Max sensor rate 130 bpm  AV delay 350.0/350.0 ms  RA sensing sensitivity 0.2 mV  RV sensing sensitivity 0.6 mV  RA pacing polarity: Bipolar  RV pacing polarity: Bipolar  RA Sense polarity: Bipolar  RV Sense polarity: Bipolar  RA pacing: Amplitude 2.0 V @ Pulse Width 0.4 ms  RV pacing: Amplitude 1.1 V @ Pulse Width 0.4 ms    RA Test:  RA impedance 583.0 ohms  RA sensing 2.9 mV    RV Test:  RV impedance 463.0 ohms  RV sensing 21.4 mV  RV pacing threshold: Amplitude 0.6 V @ Pulse Width 0.40 ms    Battery:  Battery status One Year Remaining    Percent Paced:  RA percent paced 0 %  RV percent paced 72 %

## 2018-08-29 NOTE — Progress Notes (Signed)
Cardiology Clinic Note    Brittany Starring, MD    Dear Dr. [name], MD    I had the pleasure of seeing Andreya Lacks today in Cardiology Clinic for evaluation of her pacemaker, this is her first visit to me, she came with her husband. Her cardiologist is Dr. Glynn Octave, last seen in 07-2018. She saw Dr. Lauralee Evener in 2017 for palpitations once.     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  Atrial functional MR, afib on prodaxa. HTN, frequent PVCs and SSS s/p dcPPM Corporate investment banker).  She came with her husband today, Her ppm is ERI    She is generally been in Class I-II symptoms. Her functional status overall is quite limited due to her hx of polymyalgia rheumatica and inclusion body myositis.  She is chronically on steroids for these conditions and uses a cane to get around minimally. Her PMR and [employer] have overall been quite stable.     Of note, she has been diagnosed with Raynauds in the feet by her Neurologist or Rheumatologist, she is unsure which.  She does not want trainee to do the generator change surgery, her daugther had bad experience with gallbladder surgery in [facility] for 20 days    Pertinent History:  1.  Hypothyroidism  2. Polymyalgia Rheumatica and inclusion body myositis.  Chronically elevated CPK.  On steroids    HISTORY  Patient Active Problem List    Diagnosis Date Noted    Atrial fibrillation ([place]) 07/08/2018    Status post placement of cardiac pacemaker 07/08/2018    Polymyalgia rheumatica ([place]) 04/30/2018    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    Shellfish allergy 11/22/2015    Nonrheumatic mitral valve regurgitation 09/16/2015     Past Medical History:   Diagnosis Date    Arthritis     Atrial fibrillation ([place])     Balance problem     Cancer ([place])     skin cancer    Hypertension     Shellfish allergy 11/22/2015     Past Surgical History:   Procedure Laterality Date    BIOPSY, BREAST      CESAREAN  SECTION      x 3    CHOLECYSTECTOMY      EXCISION, PILONIDAL CYST      HYSTERECTOMY      TONSILLECTOMY         ALLERGIES:    Ciprofloxacin    Unknown-Explain in Comments  Epinephrine    Tachycardia  Erythromycin    N/A  Shellfish Containing Products    Angioedema  Sulfa (Sulfonamide Antibiotics)    Nausea/Vomiting  Tetracycline    Unknown-Explain in Comments    CURRENT MEDS:   Alprazolam (XANAX) 0.25 mg Tablet Tablet Take by mouth.     Conjugated Estrogens (PREMARIN) 0.625 mg Tablet Take 0.625 mg by mouth every day.     Digoxin (LANOXIN) 125 mcg Tablet Take 1 tablet by mouth every day. 90 tablet     Hydrochlorothiazide (HYDRO-DIURIL) 25 mg Tablet Take 0.5 tablets by mouth every morning.     Metoprolol Succinate (TOPROL XL) 50 mg SR Tablet Take 1 tablet by mouth every day.     PRADAXA 150 mg Capsule Take 150 mg by mouth 2 times daily.      PredniSONE (DELTASONE) 1 mg Tablet Take 1 mg by mouth every morning after a meal.  PredniSONE (DELTASONE) 1 mg Tablet Take 5 tablets by mouth every morning after a meal.     SYNTHROID 125 mcg Tablet Take 125 mcg by mouth every morning before a meal.         Social History     Socioeconomic History    Marital status: MARRIED     Spouse name: Not on file    Number of children: Not on file    Years of education: Not on file    Highest education level: Not on file   Occupational History    Not on file   Social Needs    Financial resource strain: Not on file    Food insecurity:     Worry: Not on file     Inability: Not on file    Transportation needs:     Medical: Not on file     Non-medical: Not on file   Tobacco Use    Smoking status: Former Smoker     Packs/day: 0.50     Types: Cigarettes     Last attempt to quit: 04/19/1967     Years since quitting: 51.4    Smokeless tobacco: Never Used   Substance and Sexual Activity    Alcohol use: No    Drug use: No    Sexual activity: Not on file   Lifestyle    Physical activity:     Days per week: Not on file      Minutes per session: Not on file    Stress: Not on file   Relationships    Social connections:     Talks on phone: Not on file     Gets together: Not on file     Attends religious service: Not on file     Active member of club or organization: Not on file     Attends meetings of clubs or organizations: Not on file     Relationship status: Not on file    Intimate partner violence:     Fear of current or ex partner: Not on file     Emotionally abused: Not on file     Physically abused: Not on file     Forced sexual activity: Not on file   Other Topics Concern    Not on file   Social History Narrative    Patient is married, 2 children. She is currently retired and used to be a Pharmacist, hospital.      Family History   Problem Relation Name Age of Onset    Other (heart condition) Mother      Other (Sclerosis) Father      Other (lung disease) Brother         Constitutional: negative.  A 14 point review of systems was obtained utilizing our medical questionnaire. All systems were negative except those listed in HPI and the following:  ROS: Constitutional: negative.  Review of Systems:   Pertinent negative review of systems are summarized below. General/Constitutional: No new complaints, feels well. No fever, chills, no night sweats, no weight change. Skin: negative. Eyes/Ears/Nose/Mouth/Throat: No double vision, tearing, nose bleeding, mouth soreness, dental difficulties, nor neck problems. Cardiovascular: No precordial pain, palpitations, syncope, dyspnea on exertion, orthopnea, nocturnal paroxysmal dyspnea. Respiratory: No shortness of breath, cough, wheezing, hemoptysis. Gastrointestinal: no nausea, vomiting, diarrhea or constipation. Genitourinary: No new urgency, frequency, dysuria, nocturia, hematuria, polyuria. Musculoskeletal: No new pain, swelling, redness or soreness of muscles or joints, cramps. Neurologic: No convulsions, paralyses, numbness, tremor, incoordination, parenthesis, or  balance problems. Psychiatric:  Unremarkable and alert and cooperative. Allergic/Immunologic/Lymphatic/Endocrine: Unremarkable without new complaints.     Temp: --  Temp src: --  Pulse: --  BP: --  Resp: --  SpO2: --  Height: --  Weight: --]  There is no height or weight on file to calculate BMI.  General Appearance: healthy, alert, no distress, pleasant affect, cooperative, skin warm, dry, and pink  Lungs: clear to auscultation  Cardiovascular:S1, S2 normal, no lift, heave, or thrill, regular rate and rhythm and no murmurs, clicks, or gallops  Abdomen:BS normal.  Abdomen soft, non-tender.  No masses or organomegaly  Extremities:no cyanosis, clubbing, or edema  Skin:Skin color, texture, turgor normal. No rashes or lesions  Mental Status:Appearance/Cooperation: oriented times 3    LABS:    Lab Results   Lab Name Value Date/Time    WBC 5.0 07/15/2018 08:34 AM    WBC 6.7 06/22/2002 10:03 AM    HGB 13.9 07/15/2018 08:34 AM    HGB 13.0 06/22/2002 10:03 AM    HCT 40.1 07/15/2018 08:34 AM    HCT 37.9 06/22/2002 10:03 AM    PLT 223 07/15/2018 08:34 AM    PLT 331 06/22/2002 10:03 AM         Lab Results   Lab Name Value Date/Time    NA 134 (L) 07/15/2018 08:34 AM    NA 139 11/09/2015 11:30 AM    K 3.9 07/15/2018 08:34 AM    K 4.0 11/09/2015 11:30 AM    CL 97 07/15/2018 08:34 AM    CL 106 11/09/2015 11:30 AM    CO2 28 07/15/2018 08:34 AM    CO2 24 11/09/2015 11:30 AM    BUN 15 07/15/2018 08:34 AM    BUN 11 11/09/2015 11:30 AM    CR 0.39 (L) 07/15/2018 08:34 AM    CR 0.45 11/09/2015 11:30 AM    GLU 81 07/15/2018 08:34 AM    GLU 86 11/09/2015 11:30 AM       No results found for: CHOL, LDLC, HDL, TRIG    No results found for: HGBA1C    Lab Results   Lab Name Value Date/Time    TSH 0.99 10/17/2017 12:39 PM    TSH 4.01 06/22/2002 10:03 AM    FT4 1.16 04/08/2017 12:00 PM    FT4 1.21 06/22/2002 10:03 AM        Echocardiogram: 02-2018  SUMMARY:  1. Mild concentric left ventricular hypertrophy.  2. Mild to moderate aortic valve sclerosis/calcification without any  evidence of aortic stenosis.  3. The left ventricular diastolic function is abnormal. Stage II:    4. Pacing wire/catheter visualized in the right ventricle and  in the right atrium.  5. The left atrium is severely dilated by LA volume.  6. Severely dilated right atrium.  7. 2+ regurgitation of the mitral valve.  8. 2+ regurgitation of the tricuspid valve.  9. Left ventricular ejection fraction, by Simpson's Biplane, is normal at 68 %.    ECG: 2019  Demand pacemaker; interpretation is based on intrinsic rhythm  Atrial fibrillation with Demand ventricular pacemaker  Abnormal ECG  When compared with ECG of 11-Aug-2018 15:22,  No significant change was found  Confirmed by TAKEDA MD, PATRICIA ([id number]) on 08/14/2018 9:15:32 PM    PPM clinic 08-2018  Lacretia Tindall was seen in the Device clinic today.Dr. [name] was in clinic and spoke with  Mrs Billard about her Device battery status and the need to replace the device soon.  Mr.  Beshara  was also present.    Patient has a [place] [phone] dual chamber pacemaker that was implanted by Dr. Davina Poke on 10/16/11, now followed by Dr. [name].    Device function is normal, Lead function is normal  Battery status is MOL with estimated longevity of <3 months.  Underlying rhythm: atrial fibrillation-Vs at 46 bpm    Arrhythmias/Comments:   1 AMS episode with atrial burden of 100% for 365 days. EGMs showed atrial fibrillation/ atrial flutter.    Dr. Edison Simon placed the Device change out order, I will Notify [name] RN EP scheduling coordinator.    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 HTN, frequent PVCs and SSS s/p dcPPM Corporate investment banker). Her ppm is ERI    Plan:   PPM gen change  On prodaxa stop for 2 days.    Please feel free to contact me in the interim period with any concerns or questions.    Sincerely,    Report electronically signed by:  [name] MD, Attending Physician  Beeper [phone]

## 2018-08-29 NOTE — Progress Notes (Signed)
Brittany Stokes was seen in the Device clinic today.Dr. [name] was in clinic and spoke with  [name] about her Device battery status and the need to replace the device soon.  [name]  was also present.    Patient has a [place] [phone] dual chamber pacemaker that was implanted by Dr. Davina Poke on 10/16/11, now followed by Dr. [name].    Device function is normal, Lead function is normal  Battery status is MOL with estimated longevity of <3 months.  Underlying rhythm: atrial fibrillation-Vs at 46 bpm    Arrhythmias/Comments:   1 AMS episode with atrial burden of 100% for 365 days. EGMs showed atrial fibrillation/ atrial flutter.    Dr. Edison Simon placed the Device change out order, I will Notify [name] RN EP scheduling coordinator.    Interrogated by: Rulon Abide, HLT II    [name] MD, PH.D, F.A.C.C., F.A.S.E.  [place]  Department of Internal Medicine, Division of Cardiology

## 2018-08-30 NOTE — Progress Notes (Signed)
I have personally reviewed the report and agree with the findings.

## 2018-09-03 ENCOUNTER — Telehealth: Payer: Self-pay | Admitting: Cardiovascular Disease

## 2018-09-03 NOTE — Telephone Encounter (Signed)
Please be advised that this patient's procedure is scheduled for PM CHANGE OUT  with Dr. Edison Simon on 09/23/18 with arrival time of 1100.  Patient's demographic, insurance, address and e-mail information were confirmed and updated. Patient prefers to receive procedure information via My chart.  This information has been routed to the EP Nurse Coordinator.     Patient has questions about her Echo in relations with the PM Change out. Dr. Candiss Norse please call patient for more clarification.    Thank you,  [facility]patient [facility]iology  [phone]

## 2018-09-04 NOTE — Telephone Encounter (Signed)
I have personally reviewed the report and agree with the findings.

## 2018-09-05 ENCOUNTER — Encounter: Payer: Self-pay | Admitting: Cardiovascular Disease

## 2018-09-05 NOTE — Progress Notes (Signed)
[facility] Remote, reviewed on 08/27/2018  Brittany Stokes had a scheduled remote transmission.   Patient has a Engineer, agricultural pacemaker that was implanted by Dr. Davina Poke.  Dr. [name] now follows the Pt's device. Primary cardiologist is Dr. Zenaida Deed.    Device function is normal, Lead function is normal  Battery status is good, estimating 3< months.    Presenting EGM: AFib/ Vs 70-85bpm.    Arrhythmias/Comments: Since last clinic visit on 08/11/18  No ventricular events.    Patient is in AFib 100% of the time.    Next scheduled Clinic appt is 08/29/17. Dr. Edison Simon will consult with the Pt reg her device battery status when she is seen in the device clinic 08/29/2018 for.    Interrogated by: Rulon Abide, HLT II    [name] MD, PH.D, F.A.C.C., F.A.S.E.  [place]  Department of Internal Medicine, Division of Cardiology    Current Outpatient Medications:     Alprazolam (XANAX) 0.25 mg Tablet Tablet, Take by mouth., Disp: , Rfl:     Conjugated Estrogens (PREMARIN) 0.625 mg Tablet, Take 0.625 mg by mouth every day., Disp: , Rfl:     Digoxin (LANOXIN) 125 mcg Tablet, Take 1 tablet by mouth every day., Disp: 90 tablet, Rfl: 3    Hydrochlorothiazide (HYDRO-DIURIL) 25 mg Tablet, Take 0.5 tablets by mouth every morning., Disp: 45 tablet, Rfl: 3    Metoprolol Succinate (TOPROL XL) 50 mg SR Tablet, Take 1 tablet by mouth every day., Disp: 90 tablet, Rfl: 3    PRADAXA 150 mg Capsule, Take 150 mg by mouth 2 times daily.        , Disp: , Rfl:     PredniSONE (DELTASONE) 1 mg Tablet, Take 1 mg by mouth every morning after a meal., Disp: , Rfl:     PredniSONE (DELTASONE) 1 mg Tablet, Take 5 tablets by mouth every morning after a meal., Disp: 150 tablet, Rfl: 11    SYNTHROID 125 mcg Tablet, Take 125 mcg by mouth every morning before a meal.        , Disp: , Rfl:

## 2018-09-05 NOTE — Progress Notes (Signed)
Date of Service: 08/27/2018    Following MD: Dr. [name]  Implanting MD: Dr. Aileen Fass  Referring MD: Dr. Zenaida Deed    Diagnosis: I49.5 Sick Sinus Syndrome / Brady-Tachy    Device:  [place] [id number] ADVANTIO  Implant date: 10/16/2011  RA Lead: [place] Model 4469 [place] II Sterox EZ  Implant date: 10/16/2011   RV Lead: [place] Model 4470 [place] II Sterox EZ  Implant date: 10/16/2011     Settings:  Mode DDDR  Lower rate 60 bpm  Max tracking rate 130 bpm  Max sensor rate 130 bpm  AV delay 350/350 ms  RA sensing sensitivity 0.2 mV  RV sensing sensitivity 0.6 mV  RA pacing polarity: Bipolar  RV pacing polarity: Bipolar  RA Sense polarity: Bipolar  RV Sense polarity: Bipolar  RA pacing: Amplitude 2.0 V @ Pulse Width 0.4 ms  RV pacing: Amplitude 1.1 V @ Pulse Width 0.4 ms    RA Test:  RA impedance 538 ohms  RA pacing threshold: Amplitude 0.5 V @ Pulse Width 0.4 ms    RV Test:  RV impedance 432 ohms  RV pacing threshold: Amplitude 0.6 V @ Pulse Width 0.4 ms    Battery:  Battery status MOS  Battery remaining longevity 3 months    Percent Paced:  RA percent paced 0 %  RV percent paced 71 %

## 2018-09-06 NOTE — Progress Notes (Signed)
I have personally reviewed the report and agree with the findings.

## 2018-09-11 ENCOUNTER — Ambulatory Visit
Admission: RE | Admit: 2018-09-11 | Discharge: 2018-09-11 | Disposition: A | Discharge status: Home / Self Care | Payer: Medicare Other | Source: Ambulatory Visit | Attending: Cardiovascular Disease | Admitting: Cardiovascular Disease

## 2018-09-11 DIAGNOSIS — I313 Pericardial effusion (noninflammatory): Secondary | ICD-10-CM | POA: Insufficient documentation

## 2018-09-11 DIAGNOSIS — I358 Other nonrheumatic aortic valve disorders: Secondary | ICD-10-CM | POA: Insufficient documentation

## 2018-09-11 DIAGNOSIS — I340 Nonrheumatic mitral (valve) insufficiency: Principal | ICD-10-CM | POA: Insufficient documentation

## 2018-09-11 DIAGNOSIS — I361 Nonrheumatic tricuspid (valve) insufficiency: Secondary | ICD-10-CM | POA: Insufficient documentation

## 2018-09-11 LAB — ECHOCARDIOGRAM COMPLETE
IVSD 2D: 0.9 cm (ref 0.6–0.9)
LEFT INTERNAL DIMENSION IN SYSTOLE: 2.67 cm
LEFT VENTRICULAR INTERNAL DIMENSION IN DIASTOLE: 4.44 cm (ref 3.8–5.2)
POSTERIOR WALL: 1.02 cm (ref 0.6–0.9)
TAPSE: 1.73 cm
TV PEAK SYSTOLIC PULMONARY ARTERY PRESSURE: 41.52 mmHg

## 2018-09-15 ENCOUNTER — Encounter: Payer: Self-pay | Admitting: Cardiovascular Disease

## 2018-09-15 NOTE — Telephone Encounter (Signed)
From: Brittany Stokes  To: Sidney Ace, MD  Sent: 09/15/2018 8:39 AM PST  Subject: Test Result Question    Dr. Candiss Norse,  Would you please comment on my last Echo results. Of course, my question is how about Tricuspid valve at 3. When is the number considered serious leading to heart failure, etc. Does it really mean it is worse? Any other comments on [place] to consider?  Thank you for your expertise and time in answering these questions.   Brittany Stokes

## 2018-09-17 ENCOUNTER — Ambulatory Visit: Payer: Medicare Other | Admitting: RHEUMATOLOGY

## 2018-09-17 NOTE — Telephone Encounter (Signed)
Patient is calling in to f/u regarding message below.     She can be reached at [phone].     Thank You,   [employer]

## 2018-09-18 ENCOUNTER — Telehealth: Payer: Self-pay

## 2018-09-18 NOTE — Telephone Encounter (Signed)
Spoke to pt. She denies any symptoms of SOB, orthopnea, edema. She is concerned that she hasn't heard the results. In particular, she is worried that any worsening results may affect her device procedure on 09/23/18 with Dr. Edison Simon.

## 2018-09-19 NOTE — Telephone Encounter (Signed)
Called and spoke to  [facility] about her device procedure on 09/23/18. Patient is having a pacemaker generator change out and is to show up at 1100am in  Admissions on the first floor of the [facility] here in the [place] after midnight the night before the procedure.  May take medications the morning of the procedure with sips of water except for her HCTZ. She is to stop her Pradaxa on 09/21/18.  Should be able to go home the same day. If we do anything with the leads at the time of the change out then you will be staying overnight. Either way will need a ride to and from the hospital.  with. Patient understands instructions and will call if any other questions.       Last device check on 08/30/18:  Larry Knipp was seen in the Device clinic today.Dr. [name] was in clinic and spoke with  Mrs Guardiola about her Device battery status and the need to replace the device soon.  Mr. Dulany  was also present.    Patient has a [place] [phone] dual chamber pacemaker that was implanted by Dr. Davina Poke on 10/16/11, now followed by Dr. [name].    Device function is normal, Lead function is normal  Battery status is MOL with estimated longevity of <3 months.  Underlying rhythm: atrial fibrillation-Vs at 46 bpm    Arrhythmias/Comments:   1 AMS episode with atrial burden of 100% for 365 days. EGMs showed atrial fibrillation/ atrial flutter.    Dr. Edison Simon placed the Device change out order, I will Notify [name] RN EP scheduling coordinator.    Interrogated by: Rulon Abide, HLT II    [name] MD, PH.D, F.A.C.C., F.A.S.E.  [place]  Department of Internal Medicine, Division of Cardiology

## 2018-09-19 NOTE — Progress Notes (Signed)
Date of Service: 07/22/2018    Following MD: Dr. [name]  Implanting MD: Dr. Aileen Fass  Referring MD: Dr. Zenaida Deed    Diagnosis: I49.5 Sick Sinus Syndrome / Brady-Tachy    Device:  [place] [id number] ADVANTIO  Implant date: 10/16/2011  RA Lead: [place] Model 4469 [place] II Sterox EZ  Implant date: 10/16/2011   RV Lead: [place] Model 4470 [place] II Sterox EZ  Implant date: 10/16/2011     Settings:  Mode DDDR  Lower rate 60 bpm  Max tracking rate 130 bpm  Max sensor rate 130 bpm  AV delay 350/350 ms  RA sensing sensitivity 0.2 mV  RV sensing sensitivity 0.6 mV  RA pacing polarity: Bipolar  RV pacing polarity: Bipolar  RA Sense polarity: Bipolar  RV Sense polarity: Bipolar  RA pacing: Amplitude 2.0 V @ Pulse Width 0.4 ms  RV pacing: Amplitude 3.5 V @ Pulse Width 0.4 ms    RA Test:  RA impedance 549 ohms  RA pacing threshold: Amplitude 0.5 V @ Pulse Width 0.4 ms    RV Test:  RV impedance 453 ohms  RV pacing threshold: Amplitude 0.6 V @ Pulse Width 0.4 ms    Battery:  Battery status MOS  Battery remaining longevity 3 months    Percent Paced:  RA percent paced 0 %  RV percent paced 75 %

## 2018-09-19 NOTE — Progress Notes (Signed)
Date of Service: 07/18/2018    Following MD: Dr. [name]  Implanting MD: Dr. Aileen Fass  Referring MD: Dr. Zenaida Deed    Diagnosis: I49.5 Sick Sinus Syndrome / Brady-Tachy    Device:  [place] [id number] ADVANTIO  Implant date: 10/16/2011  RA Lead: [place] Model 4469 [place] II Sterox EZ  Implant date: 10/16/2011   RV Lead: [place] Model 4470 [place] II Sterox EZ  Implant date: 10/16/2011     Settings:  Mode DDDR  Lower rate 60 bpm  Max tracking rate 130 bpm  Max sensor rate 130 bpm  AV delay 350/350 ms  RA sensing sensitivity 0.2 mV  RV sensing sensitivity 0.6 mV  RA pacing polarity: Bipolar  RV pacing polarity: Bipolar  RA Sense polarity: Bipolar  RV Sense polarity: Bipolar  RA pacing: Amplitude 2.0 V @ Pulse Width 0.4 ms  RV pacing: Amplitude 1.1 V @ Pulse Width 0.4 ms    RA Test:  RA impedance 597 ohms  RA pacing threshold: Amplitude 0.5 V @ Pulse Width 0.4 ms    RV Test:  RV impedance 499 ohms  RV pacing threshold: Amplitude 0.5 V @ Pulse Width 0.4 ms    Battery:  Battery status MOS  Battery remaining longevity 3 months    Percent Paced:  RA percent paced 0 %  RV percent paced 75 %

## 2018-09-22 ENCOUNTER — Telehealth: Payer: Self-pay | Admitting: Cardiovascular Disease

## 2018-09-22 NOTE — Telephone Encounter (Signed)
Dr. Edison Simon called pt on Friday 2/7.

## 2018-09-22 NOTE — Telephone Encounter (Signed)
Per your request, this patient's procedure has been r/s from 09/23/18 to 10/09/18 patient arrival time 1100 with Dr. Edison Simon. Patient's demographic, insurance, address and e-mail information were confirmed and updated. Patient prefers to receive procedure information via My Chart.  This information has been routed to the EP Nurse Coordinator.         [facility]  Outpatient Cardiology  [phone]

## 2018-09-22 NOTE — Telephone Encounter (Signed)
She will take some time.    Can you set up a video conf with her on 3/2 at 9am?    Thanks,    gs

## 2018-09-22 NOTE — Telephone Encounter (Signed)
Per your request, this patient's PM CHANGE OUT procedure has been r/s from 10/09/18 to  10/07/18 patient arrival time 1100 with Dr. Edison Simon. Patient's demographic, insurance, address and e-mail information were confirmed and updated. Patient prefers to receive procedure information via My Chart.  This information has been routed to the EP Nurse Coordinator.         [facility]  Outpatient Cardiology  [phone]

## 2018-09-25 NOTE — Progress Notes (Signed)
I have personally reviewed the report and agree with the findings.

## 2018-09-26 ENCOUNTER — Ambulatory Visit: Payer: Medicare Other | Admitting: Physical Medicine & Rehabilitation

## 2018-10-01 ENCOUNTER — Ambulatory Visit: Payer: Medicare Other | Admitting: RHEUMATOLOGY

## 2018-10-01 VITALS — BP 126/62 | HR 66 | Wt 132.0 lb

## 2018-10-01 DIAGNOSIS — G7241 Inclusion body myositis [IBM]: Secondary | ICD-10-CM

## 2018-10-01 DIAGNOSIS — R531 Weakness: Secondary | ICD-10-CM

## 2018-10-01 DIAGNOSIS — M353 Polymyalgia rheumatica: Principal | ICD-10-CM

## 2018-10-01 NOTE — Nursing Note (Signed)
Vital signs obtained, chief complaint identified, allergies verified, screened for pain, med history taken, pharmacy verified.  Waldon Sheerin, MA I

## 2018-10-01 NOTE — Progress Notes (Signed)
Rheumatology Initial Consultation Note    Reason for consult : per Referring physician : Muscle pain    Dear Dr. [name], MD   I had the pleasure of seeing Brittany Stokes in consultation in my rheumatology clinic    CC : Muscle pain    HPI : Brittany Stokes, 85yrold female presents to Rheumatology clinic for initial evaluation.   Patient is a very pleasant 85year old Caucasian female accompanied by her spouse.  She reports that she has been diagnosed with inclusion body myositis in 2009.  Patient has seen multiple rheumatologist over the years, along the line she was diagnosed with polymyalgia rheumatica with increased pain and stiffness in her shoulders and thighs with some swelling in her hand was started on prednisone in 2009, the dose at that time was 15 mg in the morning and 5 mg at night and all symptoms became normal within a few days.  Patient was on prednisone for few years and then she went off the prednisone but then she had again a relapse in 2016 16 was put back on the prednisone.      Patient now reports of increased worsening muscle aches and pains and muscle weakness he denies any falls swelling or numbness.  This is had over the past few weeks.  She saw her PCP who did blood work on 0805 to the 19th show normal CBC normal CMP ESR elevated at 52 AST elevated at 38-, UA is normal.  She is denying any jaw pain or temporal headaches.  No rashes.        EMG done 11/28/2007: there is evdience for myopathy, incresed insertional activity and fibs, positive sharp waves for all the muscles of the right upper, lower extremities and paralumbar muscles. ncs minor cts right.   minor abnormality right posterior tibial.     MRI Of the upper leg w/o"  distal portion of vastus lateralis muscles bilaterally symmetrically, increased signal on stir images,     muscle bx from the L thigh:  done 11/12/2007:  left thigh muscle bx: active inflammation with muscle fiber destruction, consistent with inflammatory  myopathy:  -active interstitial inflammatory process  -lymphocytic infiltrates in the endomysium and perimysium of the muscle.   evidence of cell mediated distruction of the muscle fibers that appear degenerated and invaded by lymphocytes and histiocytes.   No ragged red fibers. NO rimmed vacuolae      Last visit history:  Patient.  She still having persistent stiffness in her hips and her thighs going to her legs she still has to use her walker regularly, she is not taking any prednisone for about 6 months.  Denies any headaches or jaw pain denies any pain or stiffness in her shoulders elbows or hands.  She has not had any recent fractures.  She reports that she feels weak in her legs over the past 2 to 4 months.    Last visit history:  Patient reports that since her last visit she is still having persistent weakness in her arms and legs bilaterally he decrease her prednisone dose down to 2 mg daily because she feels very anxious on it.  She fell about 3 months ago hit her head she went to the ER where she did a CT scan and CT scan was normal.  She reported that she tried physical therapy but still feels weak, she is using a walker regularly now she does not like to do.  Requesting neurology consult to make sure she  did not have a stroke.    Interim history:  Patient reports that since the last visit she is done prednisone 1 mg daily denies any side effects from the medication he still has persistent weakness in her arms and legs.  No recent falls he is trying to use her walker regularly and now she wants to go to physical therapy no headaches no jaw pain no vision problems.      Review of Systems   Constitutional: Positive for activity change and fatigue.   HENT: Negative.    Eyes: Negative.    Respiratory: Negative.    Cardiovascular: Negative.    Gastrointestinal: Negative.    Endocrine: Negative.    Genitourinary: Negative.    Musculoskeletal: Positive for gait problem and myalgias.   Skin: Negative.     Allergic/Immunologic: Negative.    Neurological: Positive for weakness.   Psychiatric/Behavioral: Negative.        I reviewed patient's past medical/surgical and family/social history.    PAST MEDICAL HX:  Past Medical History:   Diagnosis Date    Arthritis     Atrial fibrillation ([place])     Balance problem     Cancer ([place])     skin cancer    Hypertension     Shellfish allergy 11/22/2015       PAST SURGICAL HX:  Past Surgical History:   Procedure Laterality Date    BIOPSY, BREAST      CESAREAN SECTION      x 3    CHOLECYSTECTOMY      EXCISION, PILONIDAL CYST      HYSTERECTOMY      TONSILLECTOMY         MEDICATIONS: reviewed in EMR  Current Outpatient Medications on File Prior to Visit   Medication Sig Dispense Refill    Alprazolam (XANAX) 0.25 mg Tablet Tablet Take by mouth.      Conjugated Estrogens (PREMARIN) 0.625 mg Tablet Take 0.625 mg by mouth every day.      Digoxin (LANOXIN) 125 mcg Tablet Take 1 tablet by mouth every day. 90 tablet 3    Hydrochlorothiazide (HYDRO-DIURIL) 25 mg Tablet Take 0.5 tablets by mouth every morning. 45 tablet 3    Metoprolol Succinate (TOPROL XL) 50 mg SR Tablet Take 1 tablet by mouth every day. 90 tablet 3    PRADAXA 150 mg Capsule Take 150 mg by mouth 2 times daily.                  PredniSONE (DELTASONE) 1 mg Tablet Take 1 mg by mouth every morning after a meal.      PredniSONE (DELTASONE) 1 mg Tablet Take 5 tablets by mouth every morning after a meal. 150 tablet 11    SYNTHROID 125 mcg Tablet Take 125 mcg by mouth every morning before a meal.                   No current facility-administered medications on file prior to visit.          Immunization:    There is no immunization history on file for this patient.    ALLERGY:  Allergies   Allergen Reactions    Ciprofloxacin Unknown-Explain in Comments    Epinephrine Tachycardia    Erythromycin N/A    Shellfish Containing Products Angioedema    Sulfa (Sulfonamide Antibiotics) Nausea/Vomiting     Tetracycline Unknown-Explain in Comments        SOCIAL HX:  Social History  Socioeconomic History    Marital status: MARRIED     Spouse name: Not on file    Number of children: Not on file    Years of education: Not on file    Highest education level: Not on file   Occupational History    Not on file   Social Needs    Financial resource strain: Not on file    Food insecurity     Worry: Not on file     Inability: Not on file    Transportation needs     Medical: Not on file     Non-medical: Not on file   Tobacco Use    Smoking status: Former Smoker     Packs/day: 0.50     Types: Cigarettes     Last attempt to quit: 04/19/1967     Years since quitting: 51.4    Smokeless tobacco: Never Used   Substance and Sexual Activity    Alcohol use: No    Drug use: No    Sexual activity: Not on file   Lifestyle    Physical activity     Days per week: Not on file     Minutes per session: Not on file    Stress: Not on file   Relationships    Social connections     Talks on phone: Not on file     Gets together: Not on file     Attends religious service: Not on file     Active member of club or organization: Not on file     Attends meetings of clubs or organizations: Not on file     Relationship status: Not on file    Intimate partner violence     Fear of current or ex partner: Not on file     Emotionally abused: Not on file     Physically abused: Not on file     Forced sexual activity: Not on file   Other Topics Concern    Not on file   Social History Narrative    Patient is married, 2 children. She is currently retired and used to be a Pharmacist, hospital.        FAMILY HX:  Family History   Problem Relation Name Age of Onset    Other (heart condition) Mother      Other (Sclerosis) Father      Other (lung disease) Brother             OBJECTIVE:   Temp: --  Temp src: --  Pulse: 66 (02/19 1042)  BP: 126/62 (02/19 1042)  Resp: --  SpO2: 98 % (02/19 1042)  Height: --  Weight: 59.9 kg (132 lb) (02/19 1042)    Physical  Exam  Constitutional:       Appearance: She is well-developed.   HENT:      Head: Normocephalic and atraumatic.      Right Ear: External ear normal.      Left Ear: External ear normal.      Nose: Nose normal.   Eyes:      Conjunctiva/sclera: Conjunctivae normal.      Pupils: Pupils are equal, round, and reactive to light.   Neck:      Musculoskeletal: Normal range of motion and neck supple.   Cardiovascular:      Rate and Rhythm: Normal rate and regular rhythm.      Heart sounds: Normal heart sounds.   Pulmonary:      Effort: Pulmonary  effort is normal.      Breath sounds: Normal breath sounds.   Abdominal:      General: Bowel sounds are normal.      Palpations: Abdomen is soft.   Musculoskeletal: Normal range of motion.      Comments: Impingement in the right shoulder, normal range of motion the left shoulder normal range of motion in the elbows wrists and hands no effusion in the large joints no synovitis in the peripheral joints gelling in the hips bilaterally, 4 out of 5 muscle strength in the lower extremities normal strength in the upper extremities.   Skin:     General: Skin is warm and dry.   Neurological:      Mental Status: She is alert and oriented to person, place, and time.   Psychiatric:         Behavior: Behavior normal.         Thought Content: Thought content normal.         Judgment: Judgment normal.           LABS:  Lab Results   Lab Name Value Date/Time    WBC 5.0 07/15/2018 08:34 AM    WBC 6.7 06/22/2002 10:03 AM    HGB 13.9 07/15/2018 08:34 AM    HGB 13.0 06/22/2002 10:03 AM    HCT 40.1 07/15/2018 08:34 AM    HCT 37.9 06/22/2002 10:03 AM    PLT 223 07/15/2018 08:34 AM    PLT 331 06/22/2002 10:03 AM       Lab Results   Lab Name Value Date/Time    NA 134 (L) 07/15/2018 08:34 AM    NA 139 11/09/2015 11:30 AM    K 3.9 07/15/2018 08:34 AM    K 4.0 11/09/2015 11:30 AM    CL 97 07/15/2018 08:34 AM    CL 106 11/09/2015 11:30 AM    CO2 28 07/15/2018 08:34 AM    CO2 24 11/09/2015 11:30 AM    BUN 15  07/15/2018 08:34 AM    BUN 11 11/09/2015 11:30 AM    CR 0.39 (L) 07/15/2018 08:34 AM    CR 0.45 11/09/2015 11:30 AM    GLU 81 07/15/2018 08:34 AM    GLU 86 11/09/2015 11:30 AM     Lab Results   Lab Name Value Date/Time    AST 36 07/15/2018 08:34 AM    ALT 31 07/15/2018 08:34 AM    ALP 53 07/15/2018 08:34 AM    ALB 3.6 07/15/2018 08:34 AM    TP 7.3 07/15/2018 08:34 AM    TBIL 0.8 07/15/2018 08:34 AM           Pertinent laboratory data  and imaging on EMR was reviewed and discussed with patient.     ASSESSMENT/PLAN: Pinki Rottman is a 85yrold female who presents to rheumatology clinic for initial evaluation.  1. [place]a [place]  Patient is a very pleasant 85year old Caucasian female who has been diagnosed with polymyalgia rheumatica and also inclusion body myositis starting in 2019 and since then she has been on on and off prednisone to some degree to her previous rheumatologist.  Now she is having increasing joint pain and stiffness of the past few weeks.  It is possible that she may be having recurrence of her inflammatory arthropathy versus inflammatory muscle disease.  Is also possible that she may have some amount of adrenal insufficiency because she is been on prednisone which has been discontinued recently.  Advised patient that I  would like to repeat her blood work with her cortisol levels and ACTH levels to further insufficiency and also check her CPK and inflammatory markers to see which of her diseases are giving her problems.  It is possible that patient may need to go back on prednisone 10 mg daily to treat her inflammatory arthropathy.  I also believe that the patient has been on chronic prednisone we may better be just leave her on low-dose prednisone 3 to 4 mg so that she does not become adrenal insufficient.  We discussed her blood work and further plans in 2 weeks.    Today's evaluation  Patient will continue with the patient did show that her CRP level, her CPK level also somewhat  elevated but she has had elevated CPK level for more than 10 years.  Advised her that this is most likely a relapse of her polymyalgia rheumatica  Patient will stay on the prednisone 1 mg for now.  Patient will go to physical therapy to see if that improves her muscle strength in her lower extremities.  Continue to use walker regularly.        A total of 15 minutes were spent with the patient, more than 50 % of the face-to-face time with patient was spent discussing the patient's disease status, potential diagnosis,prognosis, treatment options, side effects, and treatment plan.   No barriers to learning.Patient verbalizes understanding of teaching and instructions.  Patient reminded to always contact me should any concerns develop.        I appreciate being able to assist in this patient's care.    RTC : 12 weeks      Report electronically signed by:  Jacqualin Combes MD  Division of Rheumatology, Allergy and Immunology

## 2018-10-03 ENCOUNTER — Telehealth: Payer: Self-pay

## 2018-10-03 NOTE — H&P (Addendum)
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 HTN, frequent PVCs and SSS s/p dcPPM Corporate investment banker). Here today for device change out since ppm is ERI.    Patient was last seen in the Cardiology clinic on 08/29/2018 with Dr. Edison Simon. She is generally been in Class I-II symptoms. Her functional status overall is quite limited due to her hx of polymyalgia rheumatica and inclusion body myositis. She is chronically on steroids for these conditions and uses a cane to get around minimally. Her PMR and [employer] have overall been quite stable.     Denies Chest pain, chest tightness, dyspnea, orthopnea, or paroxysmal nocturnal dyspnea. Patient unaware of palpitations, dizziness, presyncope or syncope. Patient has never been told of angina pectoris or prior myocardial infarction.     PAST MEDICAL HISTORY:   Past Medical History:   Diagnosis Date    Arthritis     Atrial fibrillation ([place])     Balance problem     Cancer ([place])     skin cancer    H/O shortness of breath     Hypertension     Psychiatric illness     Shellfish allergy 11/22/2015       CURRENT PROBLEM LIST:   Patient Active Problem List    Diagnosis Date Noted    Atrial fibrillation ([place]) 07/08/2018    Status post placement of cardiac pacemaker 07/08/2018    Polymyalgia rheumatica ([place]) 04/30/2018    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    Shellfish allergy 11/22/2015    Nonrheumatic mitral valve regurgitation 09/16/2015       PAST SURGICAL HISTORY:   Past Surgical History:   Procedure Laterality Date    BIOPSY, BREAST      CESAREAN SECTION      x 3    CHOLECYSTECTOMY      EXCISION, PILONIDAL CYST      HYSTERECTOMY      TONSILLECTOMY         ALLERGIES:   Allergies   Allergen Reactions    Ciprofloxacin Unknown-Explain in Comments     Epinephrine Tachycardia    Erythromycin N/A    Shellfish Containing Products Angioedema    Sulfa (Sulfonamide Antibiotics) Nausea/Vomiting    Tetracycline Unknown-Explain in Comments       MEDICATIONS:   No outpatient medications have been marked as taking for the 11/20/18 encounter [facility] Encounter).        FAMILY HISTORY:   Family History   Problem Relation Name Age of Onset    Other (heart condition) Mother      Other (Sclerosis) Father      Other (lung disease) Brother         SOCIAL HISTORY:   Social History     Tobacco Use    Smoking status: Former Smoker     Packs/day: 0.50     Years: 15.00     Pack years: 7.50     Types: Cigarettes     Last attempt to quit: 04/19/1967     Years since quitting: 51.6    Smokeless tobacco: Never Used   Substance Use Topics    Alcohol use: No    Rec. Drug Use: denies  Occupation: retired  REVIEW OF SYSTEMS:    General review negative except for the above noted.  Including absence of fever, chills, nausea, vomiting, night sweats, decreased appetite, recent weight loss, generalized bone pain, or new onset headaches.                    PHYSICAL EXAMINATION:  Pre-Sedation/Anesthesia Assessment:  ASA Status: 3 - Moderate to severe systemic disease that limits activity but not incapacitating  Airway Assessment:   Mallampati image    Mallampati Class:  2, Oral Eval:Mouth opening normal, Neck IRJ:JOAC, Thyroid-mentum distance in fingerbreaths:3  ROM normal    Vital Signs:   Vitals:    11/20/18 1141 11/20/18 1154   BP: (!) 170/122    Pulse: 77    Resp: 17    Temp: 37.2 C (98.9 F)    TempSrc: Temporal    SpO2: 99%    Weight:  59.3 kg (130 lb 11.7 oz)   Height:  1.626 m (5\' 4" )       General:NAD, alert, orientated, cooperative    Neuro: Grossly intact. Non-focal    HEENT: Normocephalic, sclera clear, pink/moist oral pharynx.    Neck: No JVD or bruits. Supple without adenopathy.    Cardiac:S1, S2 without murmur. No rub.  irregular    Lungs: normal    Abdomen:  Soft, NT,  +BS    Upper Extremities:  +2 radial and brachial pulses. No edema.    Lower Extremities:  Groin with +2 femoral pulses, negative bruits. Sensation intact.               Right: +2 DP and +2 PT pulses           Left:   +2 DP and +2 PT pulses              No pedal edema, legs are warm.    Skin:  Intact   LABS:  Lab Results   Lab Name Value Date/Time    WBC 7.6 11/20/2018 11:50 AM    WBC 6.7 06/22/2002 10:03 AM    HGB 14.8 11/20/2018 11:50 AM    HGB 13.0 06/22/2002 10:03 AM    HCT 43.1 11/20/2018 11:50 AM    HCT 37.9 06/22/2002 10:03 AM    PLT 271 11/20/2018 11:50 AM    PLT 331 06/22/2002 10:03 AM     Lab Results   Lab Name Value Date/Time    NA 134 (L) 07/15/2018 08:34 AM    NA 139 11/09/2015 11:30 AM    K 3.9 07/15/2018 08:34 AM    K 4.0 11/09/2015 11:30 AM    CL 97 07/15/2018 08:34 AM    CL 106 11/09/2015 11:30 AM    CO2 28 07/15/2018 08:34 AM    CO2 24 11/09/2015 11:30 AM    BUN 15 07/15/2018 08:34 AM    BUN 11 11/09/2015 11:30 AM    CR 0.39 (L) 07/15/2018 08:34 AM    CR 0.45 11/09/2015 11:30 AM    GLU 81 07/15/2018 08:34 AM    GLU 86 11/09/2015 11:30 AM     No components found for: HGHA1C  INR   No results found for this basename: INR:* in the last 72 hours   CrCl cannot be calculated (Patient's most recent lab result is older than the maximum 30 days allowed.).  No results found for: CHOL, LDLC, HDL, TRIG  Lab Results   Lab Name Value Date/Time    AST 36 07/15/2018 08:34 AM  ALT 31 07/15/2018 08:34 AM    ALP 53 07/15/2018 08:34 AM    ALB 3.6 07/15/2018 08:34 AM    TP 7.3 07/15/2018 08:34 AM    TBIL 0.8 07/15/2018 08:34 AM     THYROID STIMULATING HORMONE (mcIU/mL)   Date Value   06/22/2002 4.01     Thyroid Stimulating Hormone (uIU/mL)   Date Value   10/17/2017 0.99     THYROXINE, FREE (FREE T4) (ng/dL)   Date Value   06/22/2002 1.21     Thyroxine, Free (Free T4) (ng/dL)   Date Value   04/08/2017 1.16     No results found for: HSCRP  Sed Rate Westergren   Date Value   07/15/2018 19 mm/hr   06/22/2002 44      RADIOGRAPHIC FINDINGS:    CXR:     Echocardiogram: 02-2018  SUMMARY:  1. Mild concentric left ventricular hypertrophy.  2. Mild to moderate aortic valve sclerosis/calcification without any evidence of aortic stenosis.  3. The left ventricular diastolic function is abnormal. Stage II:    4. Pacing wire/catheter visualized in the right ventricle and  in the right atrium.  5. The left atrium is severely dilated by LA volume.  6. Severely dilated right atrium.  7. 2+ regurgitation of the mitral valve.  8. 2+ regurgitation of the tricuspid valve.  9. Left ventricular ejection fraction, by Simpson's Biplane, is normal at 68 %.    ECG: 2019  Demand pacemaker; interpretation is based on intrinsic rhythm  Atrial fibrillation with Demand ventricular pacemaker  Abnormal ECG  When compared with ECG of 11-Aug-2018 15:22,  No significant change was found  Confirmed by TAKEDA MD, PATRICIA ([id number]) on 08/14/2018 9:15:32 PM    PPM clinic 08-2018  Minervia Osso was seen inthe Deviceclinic today.Dr. [name] was in clinic and spoke with Mrs Wahlert about her Device battery status and the need to replace the device soon.  Mr. Advincula was also present.    Patient has a [place] [id number] dual chamber pacemaker that was implanted by Dr. Shea Stakes 10/16/11, now followed by Dr. [name].    Device function is normal, Lead function is normal  Battery status is MOL with estimated longevity of<3 months.  Underlying rhythm: atrial fibrillation-Vs at 46bpm    Arrhythmias/Comments:   1 AMS episode with atrial burden of 100% for 365 days. EGMs showed atrial fibrillation/ atrial flutter.  Dr. Edison Simon placed the Device change out order, I will Notify [name] RN EP scheduling coordinator.    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 Atrial functional MR, afib  on prodaxa. HTN, frequent PVCs and SSS s/p dcPPM Corporate investment banker) scheduled for a device change out as PPm is ERI    Anticoagulant use: Pradaxa last dose Tuesday evening 11/18/2018  Antiarrythmic use: Toprol XL last dose 11/19/2018    We have discussed the benefits, risks, and alternatives regarding procedure.  Patient wishes to proceed forward with the procedure and has signed the consent freely. We have discussed and answered all the patient's questions, and agrees with the surgical plan.    Electronically signed by:    Haywood Pao, ACNP-BC  Cardiology  Pager: [phone]      ATTENDING NOTE: I have seen and examined this patient. Please refer to the Cardiology NP's note  for the full details the history and physical examination of this visit. I agree with the NP's findings and assessment. I agree with the plans that we developed as outlined above. I was present during the entire procedure.  [name], MD

## 2018-10-03 NOTE — Telephone Encounter (Signed)
Called and spoke to [name] about her device procedure on 10/07/18. Patient is having a pacemaker generator change out and is to show up at 1100am in  Admissions on the first floor of the [facility] here in the [place] after midnight the night before the procedure.  May take medications the morning of the procedure with sips of water except for her HCTZ she is on.  Stop Pradaxa on 10/05/18. Should be able to go home the same day. If we do anything with the leads at the time of the change out then you will be staying overnight. Either way will need a ride to and from the hospital.  with. Patient understands instructions and will call if any other questions.       Last device check 08/29/18  Brittany Stokes was seen in the [place] clinic today.Dr. [name] was in clinic and spoke with  Brittany Stokes about her Device battery status and the need to replace the device soon.  Brittany Stokes  was also present.    Patient has a [place] [id number] dual chamber pacemaker that was implanted by Dr. Davina Poke on 10/16/11, now followed by Dr. [name].    Device function is normal, Lead function is normal  Battery status is MOL with estimated longevity of <3 months.  Underlying rhythm: atrial fibrillation-Vs at 46 bpm    Arrhythmias/Comments:   1 AMS episode with atrial burden of 100% for 365 days. EGMs showed atrial fibrillation/ atrial flutter.    Dr. Edison Simon placed the Device change out order, I will Notify [name] RN EP scheduling coordinator.    Interrogated by: Rulon Abide, HLT II

## 2018-10-05 ENCOUNTER — Telehealth: Payer: Self-pay | Admitting: INTERNAL MEDICINE

## 2018-10-05 NOTE — Nurse Focus (Signed)
Pt called to cancel 2/25 pacemaker exchange. Cardiology MD Garibine (sp?) notified and indicated she would notify Dr. Edison Simon and call patient back to confirm. Salvatore Marvel, RN

## 2018-10-05 NOTE — Telephone Encounter (Signed)
[place]     Name: Brittany Stokes  MRN: [id number]    10/05/18    REASON FOR CALL: cancel PPM exchange appointment       BRIEF NOTE:     Brittany Stokes had called the PACU who paged CCU that she has to re-schedule Brittany Stokes PPM exchange scheduled for Tuesday, 2/25. Brittany Stokes husband is admitted at [facility] for a bowel obstruction, and there is concern that he has metastatic colon cancer on the PET scan. He has surgery scheduled for tomorrow, so Brittany Stokes is not able to come to [place] on Tuesday as scheduled. I encouraged Brittany Stokes to re-schedule this procedure as soon as convenient since Brittany Stokes is scheduled to expire around April 2020 ([place] dual chamber pacemaker that was implanted by Dr. Shea Stakes 10/16/11 and now follows with Dr. Edison Simon outpatient).     I spoke with Dr. Edison Simon, the EP attending scheduled for the [facility] on Tuesday. I will also route this encounter and leave a message for coordinator Sia Brima [phone]) to help with rescheduling.       ---  Elliot Cousin, M.D.   PGY-3, Internal Medicine  [facility]  Pager: [phone] / [place]: [phone]

## 2018-10-06 ENCOUNTER — Telehealth: Payer: Self-pay | Admitting: Cardiovascular Disease

## 2018-10-06 NOTE — Telephone Encounter (Signed)
Per EMR message, patient is requesting to cancel as her husband is having surgery.    Brittany Stokes  [phone]

## 2018-10-07 ENCOUNTER — Telehealth: Payer: Self-pay | Admitting: Cardiovascular Disease

## 2018-10-07 ENCOUNTER — Ambulatory Visit: Payer: Self-pay

## 2018-10-07 NOTE — Telephone Encounter (Signed)
Hi Dr. Edison Simon    As you already know. Per Dr. Paula Libra note 10/05/2018    Metta Koranda has a Careers adviser.Device reached ERI on 09/04/17. Erenest Rasher RN and Sia have scheduled this Pt twice for a change out with you.  09/23/2018 and 10/07/2018. Pt keeps declining pacemaker change out due to her husbands health.     She is afraid waiting until Mid March to have her device changed out is dangerous, but wants to get her husbands health situation under control.She wants to talk with you about what she should do-       [employer] Device Tech  HLT II.   [phone]

## 2018-10-08 ENCOUNTER — Telehealth: Payer: Self-pay | Admitting: Cardiovascular Disease

## 2018-10-08 NOTE — Telephone Encounter (Signed)
Please be advised that this patient's procedure is scheduled for PM CHANGE OUT  with Dr. Edison Simon on 10/20/18 with arrival time of 1100.  Patient's demographic, insurance, address and e-mail information were confirmed and updated. Patient prefers to receive procedure information via My Chart.  This information has been routed to the EP Nurse Coordinator.       [place]  Outpatient Cardiology  [phone]

## 2018-10-13 ENCOUNTER — Telehealth: Payer: Self-pay | Admitting: Cardiovascular Disease

## 2018-10-13 NOTE — Telephone Encounter (Signed)
Spoke with patient, 3 identifiers used. Pt voicing concerns regarding the presence of a Covid-19 patient in the hospital, she is scared to come for her procedure, especially since her husband is currently admitted at [facility] with Colon CA. Explained the following:     Patients calling with questions and expressing fears about safety at the hospital:    Taking care of individuals with complex illnesses such as an infectious disease is nothing new for our care teams - we do it every single day. We frequently handle highly contagious disease cases such as TB. Our teams are trained and equipped to treat this patient and any others who have a potential contagious disease.    As always, the safety of all patients is our top priority. Patients, staff and visitors should know that we have the right people, with the right training, facilities and processes to ensure a healthy and safe environment for everyone at all times.      The coronavirus patient who is in our hospital is in an isolation room, with airborne protections that creates a barrier with the rest of the hospital. It continues to be safe to check into [facility] and the [facility] clinics throughout our health system. As in any hospital, there are many infection control and management measures in place, at all times.     We are meeting, and in some cases exceeding, CDC protocols for determination of exposure and surveillance of both staff and patients at the [facility].        As a precaution, some employees are being asked to stay home and monitor their condition, the same as we do in other circumstances. Those employees were specifically identified and contacted individually. We are working on this in the same way we manage other diseases that require airborne precautions and monitoring.  This is something that we do every day here in the hospital.    As we regularly handle patients with infectious diseases, we have proper  infection prevention protocols in place to handle any coronavirus patient, and certainly others with more frequently seen infectious diseases.      We are proud of our health care workers who have been working to care for this patient and are committed to saving this patient's life.       We are not cancelling any appointments. There is no additional risk to patients, staff or visitors than there is any other day of the year.  We manage infectious disease every day in the hospital - it's what we do.  We are here to care for everyone, and to make sure every gets excellent care.    She is still expressing concerns about coming to [facility] for her procedure, inquiring how much time she has on her battery. Per [facility] device tech, patient reached ERI 09/04/18, has approx 3 months, RV paced 70% of the time. Called patient to advise, LM regarding the above and advised that I will have Dr. Edison Simon call her tomorrow to discuss.    Newt Minion, RN  Cardiology Triage

## 2018-10-13 NOTE — Telephone Encounter (Signed)
I have personally reviewed the report and agree with the findings.

## 2018-10-13 NOTE — Telephone Encounter (Signed)
Spoke with Brittany Stokes, Cardiac Triage.  Patient's device went ERI 09/04/18.  Underlying atrial fibrillation @ 46 (08/29/18 in-clinic visit) and VP 71%.    Jamelle Haring, CCT, HLT II

## 2018-10-13 NOTE — Telephone Encounter (Signed)
Patient is calling stating she has a procedure to change out her battery in her pacemaker.    States she has a few concerns, states her husband is currently admitted at [facility] with Colon Cancer.    States she is worried with the Corona Virus at our hospital and is scared to come in knowing its here.   She wants to get some advise of having this procedure done and it is safe to do so.    She wanted to know how much longer can she go before having her battery change out what is the life of her battery.    Please call patient back [phone].    Thank You,  Irving Shows

## 2018-10-17 ENCOUNTER — Telehealth: Payer: Self-pay

## 2018-10-17 NOTE — Telephone Encounter (Signed)
Called and left a message for Earle Burson about her device procedure on 10/20/18. Patient is having a pacemaker generator change out and is to show up at 1100am in  Admissions on the first floor of the [facility] here in the [place] after midnight the night before the procedure.  May take medications the morning of the procedure with sips of water except for HCTZ. Stop Pradaxa on 10/18/18. Should be able to go home the same day. If we do anything with the leads at the time of the change out then you will be staying overnight. Either way will need a ride to and from the hospital.  with. Asked patient to please call me back and confirm that she has received the message and understands instructions. Also if I can answer any questions patient may have.         Last device check 08/29/18    Sophi Calligan was seen in the [facility] today.Dr. [name] was in clinic and spoke with  Mrs Montag about her Device battery status and the need to replace the device soon.  Mr. Slingerland  was also present.    Patient has a [place] [id number] dual chamber pacemaker that was implanted by Dr. Davina Poke on 10/16/11, now followed by Dr. [name].    Device function is normal, Lead function is normal  Battery status is MOL with estimated longevity of <3 months.  Underlying rhythm: atrial fibrillation-Vs at 46 bpm    Arrhythmias/Comments:   1 AMS episode with atrial burden of 100% for 365 days. EGMs showed atrial fibrillation/ atrial flutter.    Dr. Edison Simon placed the Device change out order, I will Notify [name] RN EP scheduling coordinator.    Interrogated by: Rulon Abide, HLT II    [name] MD, PH.D, F.A.C.C., F.A.S.E.  [place]  Department of Internal Medicine, Division of Cardiology

## 2018-10-20 ENCOUNTER — Ambulatory Visit: Payer: Self-pay

## 2018-10-20 ENCOUNTER — Telehealth: Payer: Self-pay | Admitting: Cardiovascular Disease

## 2018-10-20 NOTE — Telephone Encounter (Signed)
Left message for patient to return call. Congetta Odriscoll, RN

## 2018-10-20 NOTE — Telephone Encounter (Signed)
Patient is calling in and states that she found out that her husband has cancer.  States that she has been in the hospital with him for about 17 day so she is not getting enough sleep.     States that her feet are swollen and would like suggestions from Dr. Candiss Norse on what she needs to do. States that she would like to be able to take care of her husband.     Patient can be reached at [phone]    Thank You,   Brittany Stokes

## 2018-10-21 NOTE — Telephone Encounter (Signed)
Patient is calling back.     Thank You,  Brittany Stokes

## 2018-10-22 NOTE — Telephone Encounter (Signed)
Spoke with patient, 2 identifiers used. Patient reports that her feet, ankles and legs are "pretty swollen", this is a new issue for her. Pt's husband has been admitted at [facility] for 19 days and she hasn't left his side. She says she is eating a low salt diet, but spends the majority of her time sitting at her husband bedside. She is very stressed and fatigued. She believes her blood pressure to be 180's/90's, she can "feel" that it's high. Pt inquiring whether she can take extra doses of her metoprolol and/or HCTZ for her edema or when she feels her BP is high. Explained that blood pressure meds cannot be taken PRN. Since patient has not recently checked her BP, I requested she sit quietly for 15 minutes, then check her blood pressure, and call back with reading.    Pt's BP 145/85, HR 60.    Forwarding to Dr. Candiss Norse for recommendations.

## 2018-10-22 NOTE — Telephone Encounter (Signed)
Left message for patient to return call. Bridger Pizzi, RN

## 2018-10-22 NOTE — Telephone Encounter (Signed)
Patient is calling in and would like a call back. Patient can be reached at [phone].     Thank You,   [employer]

## 2018-10-22 NOTE — Telephone Encounter (Signed)
Patient returned nurse phone call.  She asked to speak with a nurse today if possible.

## 2018-10-22 NOTE — Telephone Encounter (Signed)
Left message for pt to return call.    Lindie Spruce, RN

## 2018-10-27 ENCOUNTER — Ambulatory Visit: Payer: Medicare Other | Admitting: Cardiovascular Disease

## 2018-10-27 DIAGNOSIS — Z79899 Other long term (current) drug therapy: Secondary | ICD-10-CM

## 2018-10-27 DIAGNOSIS — Z950 Presence of cardiac pacemaker: Principal | ICD-10-CM

## 2018-10-27 NOTE — Telephone Encounter (Signed)
Brittany Stokes,    Can you see if she wants a video visit today?    Maybe in the afternoon?    Looks like there are some slots.    gs

## 2018-10-27 NOTE — Video Visit Notes (Signed)
Duplicate

## 2018-10-27 NOTE — Video Visit Notes (Signed)
Video Visit Note     I performed this clinical encounter by utilizing a real time telehealth video connection between my location and the patient's location. The patient's location was confirmed during this visit. I obtained verbal consent from the patient to perform this clinical encounter utilizing video and prepared the patient by answering any questions they had about the telehealth interaction.    CHIEF COMPLAINT: Follow up of sick sinus syndrome s/p PPM    SUBJECTIVE: Patient reports he has been in the hospital for 25 days. She endorses she is planning to go for battery replacement of her pacemaker placement. She also notes her husband has colon cancer and she has been caring for him and is nervous about having her procedure completed in light of the recent viral outbreak. She is inquiring regarding the safety of her presenting to the hospital for her procedure. She also endorses recently increased blood pressure and she has been taking extra Metoprolol recently (half of a 25 mg tablet).  At this time patient denies chest pain, pressure or dyspnea on exertion. Denies any orthopnea, PND or LE edema. Also denies any lightheadedness, dizziness, palpitations, pre-syncope or syncope.       OBJECTIVE:   Echocardiogram 08/2018  SUMMARY:  1. Left ventricular ejection fraction, by Simpson's Biplane, is normal at 67 %.  2. Mild to moderate aortic valve sclerosis/calcification without any evidence of aortic stenosis.  3. The left ventricular diastolic function is normal.  4. Pacing wire/catheter visualized in the right ventricle and Pacing wire/catheter visualized in   the right atrium.  5. The left atrium is moderately dilated by LA volume.  6. The right atrium is normal in size and structure.  7. Mildly elevated pulmonary artery systolic pressure.  8. The inferior vena cava is dilated.  9. 2+ regurgitation of the mitral valve.  10. 3+ regurgitation of the tricuspid valve.  11. Trivial pericardial  effusion.      ASSESSMENT & PLAN: I have recommended she should not take more than an extra 25 mg of Metoprolol daily. She should continue with her HCTZ as prescribed. I will discuss with our electrophysiologists to determine if she would be able to defer her battery replacement to a later date in order to avoid the recent viral outbreak.    Approximately 20 minutes were spent with the patient, of which more than 50% of the time was spent in counseling and/or coordinating care on sick sinus syndrome and pacemaker battery replacement. The patient understands and agrees with the plan of care outlined.    SCRIBE DISCLAIMER:  I, Verne Carrow, SCRIBE, am personally taking down the notes in the presence of Dr. Zenaida Deed, MD  Electronically signed by - Verne Carrow, [place], Scribe  10/27/2018  4:08 PM      PROVIDER DISCLAIMER:  Myna Hidalgo, MD, personally performed the services described in this documentation, as scribed by the trained medical scribe above in my presence, and it is both accurate and complete.    Electronically signed by: Zenaida Deed, MD, [facility], FSCAI (10/29/2018, 6:22 PM)    Zenaida Deed MD, [facility], [facility]  Interventional Cardiology  Pgr [phone]

## 2018-10-27 NOTE — Telephone Encounter (Signed)
Called and spoke with patient.   Scheduled VV for 1:20 today.     Thank You,   Lisette Montelongo

## 2018-10-30 ENCOUNTER — Telehealth: Payer: Self-pay | Admitting: Cardiovascular Disease

## 2018-10-30 NOTE — Telephone Encounter (Signed)
Please be advised that this patient's procedure is scheduled for PM CHANGE OUT  with Dr. Edison Simon on 11/20/18 with arrival time of 1100.  Patient's demographic, insurance, address and e-mail information were confirmed and updated. Patient prefers to receive procedure information via My chart.  This information has been routed to the EP Nurse Coordinator.       [place]  Outpatient Cardiology  [phone]

## 2018-11-04 ENCOUNTER — Encounter: Payer: Self-pay | Admitting: Cardiovascular Disease

## 2018-11-18 ENCOUNTER — Telehealth: Payer: Self-pay

## 2018-11-18 NOTE — Telephone Encounter (Signed)
Called and spoke to [facility] about her device procedure on 10/20/18. Patient is having a Pacemaker generator change out and is to show up at 1100am in  Admissions on the first floor of the [facility] here in the [facility]. NPO after midnight the night before the procedure.  May take medications the morning of the procedure with sips of water except for HCTZ. Stopped Pradaxa today. Should be able to go home the same day. If we do anything with the leads at the time of the change out then you will be staying overnight. Either way will need a ride to and from the hospital.  with. Patient understands instructions and will call if any other questions.       Last device check 09/05/18:   [employer] Remote, reviewed on 08/27/2018  Bhumi Godbey had a scheduled remote transmission.   Patient has a Engineer, agricultural pacemaker that was implanted by Dr. Davina Poke.  Dr. [name] now follows the Pt's device. Primary cardiologist is Dr. Zenaida Deed.    Device function is normal, Lead function is normal  Battery status is good, estimating 3< months.    Presenting EGM: AFib/ Vs 70-85bpm.    Arrhythmias/Comments: Since last clinic visit on 08/11/18  No ventricular events.    Patient is in AFib 100% of the time.    Next scheduled Clinic appt is 08/29/17. Dr. Edison Simon will consult with the Pt reg her device battery status when she is seen in the device clinic 08/29/2018 for.    Interrogated by: Rulon Abide, HLT II    [name] MD, PH.D, F.A.C.C., F.A.S.E.  [place]  Department of Internal Medicine, Division of Cardiology       CDC COVID-19 Screening     Symptoms:      Cough: no     Rhinorrhea: no     Shortness of breath: no     Fever: no     Other: none     Exposure risk: no     Travel: no, Other none     Dates of travel (for each location if multiple): N/A     Exposure to known COVID-19 patient or PUI: no     High prevalence in community: no     Due to Covid-19 Virus:    Visitors are  only allowed if they do not have symptoms of illness (fever, runny nose, cough, shortness of breath).   Visitors should be 12 years of age or older. Patients who have an appointment at a [facility] and will be going home the same day, may have one person with them. Visitors will be able to stay with the patient until patient is taken back to their procedure. They then will be asked to wait at the hospital cafeteria or in their car, will be called when patient is done with procedure.

## 2018-11-20 ENCOUNTER — Ambulatory Visit (HOSPITAL_BASED_OUTPATIENT_CLINIC_OR_DEPARTMENT_OTHER): Payer: Medicare Other

## 2018-11-20 ENCOUNTER — Ambulatory Visit
Admission: RE | Admit: 2018-11-20 | Discharge: 2018-11-20 | Disposition: A | Discharge status: Home / Self Care | Payer: Medicare Other | Attending: Cardiovascular Disease | Admitting: Cardiovascular Disease

## 2018-11-20 ENCOUNTER — Other Ambulatory Visit: Payer: Self-pay

## 2018-11-20 DIAGNOSIS — Z7901 Long term (current) use of anticoagulants: Secondary | ICD-10-CM | POA: Insufficient documentation

## 2018-11-20 DIAGNOSIS — I340 Nonrheumatic mitral (valve) insufficiency: Secondary | ICD-10-CM | POA: Insufficient documentation

## 2018-11-20 DIAGNOSIS — M353 Polymyalgia rheumatica: Secondary | ICD-10-CM | POA: Insufficient documentation

## 2018-11-20 DIAGNOSIS — I454 Nonspecific intraventricular block: Secondary | ICD-10-CM

## 2018-11-20 DIAGNOSIS — I10 Essential (primary) hypertension: Secondary | ICD-10-CM | POA: Insufficient documentation

## 2018-11-20 DIAGNOSIS — Z45010 Encounter for checking and testing of cardiac pacemaker pulse generator [battery]: Secondary | ICD-10-CM

## 2018-11-20 DIAGNOSIS — I493 Ventricular premature depolarization: Secondary | ICD-10-CM | POA: Insufficient documentation

## 2018-11-20 DIAGNOSIS — Z7952 Long term (current) use of systemic steroids: Secondary | ICD-10-CM | POA: Insufficient documentation

## 2018-11-20 DIAGNOSIS — I495 Sick sinus syndrome: Secondary | ICD-10-CM

## 2018-11-20 DIAGNOSIS — Z85828 Personal history of other malignant neoplasm of skin: Secondary | ICD-10-CM | POA: Insufficient documentation

## 2018-11-20 DIAGNOSIS — G7241 Inclusion body myositis [IBM]: Secondary | ICD-10-CM | POA: Insufficient documentation

## 2018-11-20 DIAGNOSIS — I4891 Unspecified atrial fibrillation: Secondary | ICD-10-CM | POA: Insufficient documentation

## 2018-11-20 DIAGNOSIS — Z950 Presence of cardiac pacemaker: Secondary | ICD-10-CM

## 2018-11-20 DIAGNOSIS — Z87891 Personal history of nicotine dependence: Secondary | ICD-10-CM | POA: Insufficient documentation

## 2018-11-20 HISTORY — DX: Personal history of other specified conditions: Z87.898

## 2018-11-20 HISTORY — DX: Mental disorder, not otherwise specified: F99

## 2018-11-20 HISTORY — PX: PACEMAKER GENERATOR CHANGE: SHX5998

## 2018-11-20 LAB — CBC WITH DIFFERENTIAL
Basophils % Auto: 0.9 %
Basophils Abs Auto: 0.1 10*3/uL (ref 0.0–0.2)
Eosinophils % Auto: 0.5 %
Eosinophils Abs Auto: 0 10*3/uL (ref 0.0–0.5)
Hematocrit: 43.1 % (ref 36.0–46.0)
Hemoglobin: 14.8 g/dL (ref 12.0–16.0)
Lymphocytes % Auto: 37.4 %
Lymphocytes Abs Auto: 2.8 10*3/uL (ref 1.0–4.8)
MCH: 32.9 pg (ref 27.0–33.0)
MCHC: 34.3 % (ref 32.0–36.0)
MCV: 95.9 fL (ref 80.0–100.0)
MPV: 7.6 fL (ref 6.8–10.0)
Monocytes % Auto: 7.6 %
Monocytes Abs Auto: 0.6 10*3/uL (ref 0.1–0.8)
Neutrophils % Auto: 53.6 %
Neutrophils Abs Auto: 4.1 10*3/uL (ref 1.8–7.7)
Platelet Count: 271 10*3/uL (ref 130–400)
RDW: 13.2 % (ref 0.0–14.7)
Red Blood Cell Count: 4.49 10*6/uL (ref 4.00–5.20)
White Blood Cell Count: 7.6 10*3/uL (ref 4.5–11.0)

## 2018-11-20 LAB — BASIC METABOLIC PANEL
Calcium: 10 mg/dL (ref 8.6–10.5)
Carbon Dioxide Total: 25 mmol/L (ref 24–32)
Chloride: 99 mmol/L (ref 95–110)
Creatinine Serum: 0.43 mg/dL — ABNORMAL LOW (ref 0.44–1.27)
E-GFR Creatinine (Female): 94 mL/min/{1.73_m2}
E-GFR, African American (Female): >100 mL/min/{1.73_m2}
Glucose: 110 mg/dL — ABNORMAL HIGH (ref 70–99)
Potassium: 3.6 mmol/L (ref 3.3–5.0)
Sodium: 135 mmol/L (ref 135–145)
Urea Nitrogen, Blood (BUN): 14 mg/dL (ref 8–22)

## 2018-11-20 LAB — TYPE AND SCREEN
Ab Scrn-Gel: NEGATIVE
Patient Blood Type: A POS

## 2018-11-20 LAB — INR
INR: 1.04 (ref 0.87–1.18)
Prothrombin Time: 9.5 secs (ref 8.4–10.7)

## 2018-11-20 LAB — MAGNESIUM (MG): Magnesium (Mg): 2.1 mg/dL (ref 1.5–2.6)

## 2018-11-20 MED ORDER — MIDAZOLAM (PF) 1 MG/ML INJECTION SOLUTION
INTRAMUSCULAR | Status: AC
Start: 2018-11-20 — End: 2018-11-20
  Filled 2018-11-20: qty 4, fill #0

## 2018-11-20 MED ORDER — HYDROCODONE 5 MG-ACETAMINOPHEN 325 MG TABLET
1.0000 | ORAL_TABLET | Freq: Four times a day (QID) | ORAL | 0 refills | Status: AC | PRN
Start: 2018-11-20 — End: 2018-11-23
  Filled 2018-11-20: qty 12, 3d supply, fill #0

## 2018-11-20 MED ORDER — NALOXONE 4 MG/ACTUATION NASAL SPRAY
NASAL | 0 refills | Status: AC
Start: 2018-11-20 — End: 2019-11-20
  Filled 2018-11-20: qty 2, 14d supply, fill #0

## 2018-11-20 MED ORDER — BUPIVACAINE (PF) 0.5 % (5 MG/ML) INJECTION SOLUTION
INTRAMUSCULAR | Status: AC
Start: 2018-11-20 — End: 2018-11-20
  Filled 2018-11-20: qty 30, fill #0

## 2018-11-20 MED ORDER — CEFAZOLIN IV PUSH ONCALL TO OR
2.0000 g | INTRAMUSCULAR | Status: DC
Start: 2018-11-20 — End: 2018-11-20

## 2018-11-20 MED ORDER — HYDROCODONE 5 MG-ACETAMINOPHEN 325 MG TABLET
1.0000 | ORAL_TABLET | ORAL | Status: DC | PRN
Start: 2018-11-20 — End: 2018-11-20

## 2018-11-20 MED ORDER — VANCOMYCIN 1 GRAM/200 ML IN DEXTROSE 5 % INTRAVENOUS PIGGYBACK
1000.0000 mg | INJECTION | INTRAVENOUS | Status: DC
Start: 2018-11-20 — End: 2018-11-20

## 2018-11-20 MED ORDER — FENTANYL (PF) 50 MCG/ML INJECTION SOLUTION
INTRAMUSCULAR | Status: AC
Start: 2018-11-20 — End: 2018-11-20
  Filled 2018-11-20: qty 4, fill #0

## 2018-11-20 MED ORDER — CEPHALEXIN 500 MG CAPSULE
500.0000 mg | ORAL_CAPSULE | Freq: Four times a day (QID) | ORAL | 0 refills | Status: AC
Start: 2018-11-20 — End: 2018-11-25
  Filled 2018-11-20: qty 20, 5d supply, fill #0

## 2018-11-20 MED ORDER — BACITRACIN 50,000 UNIT INTRAMUSCULAR SOLUTION
INTRAMUSCULAR | Status: AC
Start: 2018-11-20 — End: 2018-11-20
  Filled 2018-11-20: qty 50000, fill #0

## 2018-11-20 MED ORDER — NACL 0.9% IV INFUSION
INTRAVENOUS | Status: DC
Start: 2018-11-20 — End: 2018-11-20
  Administered 2018-11-20: 13:00:00 via INTRAVENOUS

## 2018-11-20 MED ORDER — ACETAMINOPHEN 500 MG TABLET
500.0000 mg | ORAL_TABLET | ORAL | Status: DC | PRN
Start: 2018-11-20 — End: 2018-11-20
  Administered 2018-11-20: 15:00:00 500 mg via ORAL
  Filled 2018-11-20: qty 1, fill #0

## 2018-11-20 MED ORDER — CEFAZOLIN 1 GRAM SOLUTION FOR INJECTION
INTRAMUSCULAR | Status: AC
Start: 2018-11-20 — End: 2018-11-20
  Filled 2018-11-20: qty 5, fill #0

## 2018-11-20 NOTE — Nurse Focus (Signed)
Pt off bedrest @ 1530 time. Pt off acuity to micropaq. Assisted pt to ambulate per protocol.  Left chest wound site stable, no hematoma, bruit or bleeding.  Pt got Tylenol for tenderness on left chest, and stated the pain is better. Pending discharge.     Ethelene Closser - Alphonzo Grieve, RN RN

## 2018-11-20 NOTE — Nurse Focus (Signed)
Pt back from pacemaker change-out procedure, pt's alert and oriented, vital signs stable, L upper chest device site dry, no bleeding or hematoma. Pt denied pain. Pt's grandson Lovena Le updated of pt's status.  Tiran Sauseda - Alphonzo Grieve, RN

## 2018-11-20 NOTE — Nurse Assessment (Signed)
Admission Note:    Patient admitted from home @ 1130 for procedure. Patient has been placed on continuous cardiac and vital signs monitoring. VSS, A/OX4. The patient was informed of unit safety precautions and routine, and the placement of personal items. Patient remains NPO, PIV placed, patient prepped for procedure, admission assessment completed. Pre, peri and post procedure care and routines were discussed with the patient/family. Patient's concerns were discussed. Patient verbalize understanding and agree with plan of care.  Aisley Whan - Alphonzo Grieve, RN

## 2018-11-20 NOTE — Discharge Instructions (Signed)
[place]   [place], [place]    POST DEVICE IMPLANT DISCHARGE INSTRUCTIONS        v  Wound care instructions      ***Keep bandage on and dry until seen in clinic, keep wound clean and dry, take a bath, no showering for 7 days.         Call and make appointment for a wound check appointment to be seen within 7-10 days at the [facility], [phone] M-F, 8:00 to 5:00.    If you are having any problems (redness, swelling, drainage) or any other concerns prior to your wound check please call for assistance at:      [facility] [phone] M-F, 8:00 to 5:00 for questions during normal business hours. For problems or concerns after hours and weekends [phone] and ask for Cardiologist on call      ACTIVITY LEVEL INFORMATION     v Avoid any activity for the next week    v Routine walking is allowed at the beginning of recovery period.     v Do not lift anything heavier than 10 pounds for at least 6 weeks after the procedure.    v Do not golf, swim,lift weights or perform any type of repetitive activity with the upper body for 3 months. Get OK from doctor before resuming these activities.    v Relax and rest at home for the first day or two after discharge.    v Driving, speak to your doctor for specific instructions.  Do not drive until seen in clinic and cleared by your doctor.  In general, no driving for at least 2 weeks.        OTHER     v If you have steri-strips over the wound, wait until they fall off by themselves (can take about 2-3 weeks or so).    v You may experience bruising and soreness at the catheter insertion sites and incision area. This is normal.    v Antibiotics: If you were instructed by your physician to take antibiotics at home for a few days, you need to take all of your pills.    v Pain medication: You may take regular strength Tylenol every 4 to 6 hours as needed for soreness or pain at incision site. If additional  pain medicine is prescribed for you by your physician, take as directed.     v Keep your temporary ID card about your device until your permanent one arrives in the mail about 4 to 6 weeks after implant. Keep your card with you at all times.    v Medic Alert ID: You may want to get an ID bracelet or necklace to have on you at all times so if you are unable to communicate, your device information is on your ID.    v Electro-Magnetic Interference (EMI):     Hold cell phone to the ear opposite side of your device or at least 6 to 12 inches away from device. Cordless phones will not affect your device   Do not lean over a running car engine, avoid arc welders or large electrical generators   MRI: MRI has a large magnet inside of it. Let the physician ordering the scan know that you have a device.  Some devices are MRI conditional and some MRI's can still be perform 6 weeks after implant with certain devices. Please alert your physician if they order an MRI.  Microwave ovens are safe to use; so are most of the appliances or equipment in your home or an office.   If have any questions or concerns about any other type of equipment call your doctors office.    v Planning a trip:  Check with your cardiologist first before traveling. Take ID card with you and present it at security checkpoint. There is no data to suggest that airport security passageways will adversely affect your device.    v Defibrillator patients only, If you:      Experience a single shock and feel fine afterwards the data from that shock is stored safely by your device and can be checked the next business day. If you don't feel well call your physician or go straight to Emergency room.   Experience 2 shocks call your physician or the doctor on call right away then you will be directed on where to go.    Experience 3 or more shocks call 911 and be seen at the closest Emergency Room.      Other Instructions:  1. Call the clinic today to schedule  an appt for device and wound check in 7-10 days. [phone]

## 2018-11-20 NOTE — Nurse Transfer Note (Signed)
Electrophysiology Lab                                                 Nursing hand-off Report      Electrophysiology MD: [name],   Pager: [phone]      Procedure Performed: Generator Change Out [facility])      Procedural Medications:  Antibiotic:  Cefazolin 1000  Mg IV  Fentanyl:   50   Mcg   Versed:     1  Mg      Intake:  IV fluids: 100     ML  Blood Products:  0  ML  Visipaque (contrast) Total:  0 ML    Output:  Urinary Output:  To void  EBL  <20        Vital Signs:  HR: 74  bpm,       Blood Pressure:  150/27mmHg,   RR:  18 /M  SpO2  99%  On room air    Device  Setting  Mode: DDDR .   Rate: 60-130 .    Device Location: Left chest.  Procedural Complication:  None    Current Condition:  Stable.      Patient transferred via gurney/bed at  1325  back to CSU with RN.  EKG, B/P, and SpO2 monitors.  Report given to bedside nurse.    Cardiac Electrophysiology Lab  [phone]

## 2018-11-20 NOTE — Nurse Discharge Note (Signed)
DISCHARGE NOTE:    Patient has been discharged home with all instructions regarding changes to medication regiment and /or diet,activity limitation as related to procedure, and sedation received, all follow up care, and when to seek medical assistance or advice.Patient and family verbalize understanding of all d/c instructions.Discharge paperwork given to patient.PIV discontinued.Patient sent home in stable condition with family.  Leidi Astle - Helen Adrain Nesbit, RN

## 2018-11-21 ENCOUNTER — Encounter: Payer: Self-pay | Admitting: Cardiovascular Disease

## 2018-11-21 ENCOUNTER — Telehealth: Payer: Self-pay | Admitting: Cardiovascular Disease

## 2018-11-21 ENCOUNTER — Ambulatory Visit: Payer: Medicare Other | Admitting: Physical Medicine & Rehabilitation

## 2018-11-21 LAB — CULTURE SURVEILLANCE, MRSA

## 2018-11-21 LAB — ELECTROCARDIOGRAM WITH RHYTHM STRIP: QTC: 400

## 2018-11-21 NOTE — Telephone Encounter (Signed)
Pt ok to resume pradaxa. Dr. Edison Simon called patient directly to advise.    Newt Minion, RN  Cardiology Triage

## 2018-11-21 NOTE — Telephone Encounter (Signed)
Patient is calling in and states that she had pacemaker change out. States that she has question regarding Pradaxa.     Patient states that It states to not start  pradaxa until Friday.  States she wants to make sure that is correct.     Patient can be reached at [phone].     Thank You,   [employer]

## 2018-11-24 ENCOUNTER — Telehealth: Payer: Self-pay | Admitting: Cardiovascular Disease

## 2018-11-24 NOTE — Telephone Encounter (Signed)
Patient is calling back stating no one has called her.  I let her know md hasn't responeded    States the question can go to both her MD' Dr. Candiss Norse and Dr. Edison Simon.    She would like to speak to a nurse today before everyone goes home to help her calm her fears a little      Thank You,  [employer]

## 2018-11-24 NOTE — Telephone Encounter (Signed)
Patient is calling in and states that she had a battery change out.  States that her device was set to 60 for several years.  States that they changed it to 74 and would like to know why they changed it.     Patient can be reached at [phone]    Thank You,   Merita Norton

## 2018-11-24 NOTE — Telephone Encounter (Signed)
Placing encounter in Dr. Brent Bulla folder.    Newt Minion, RN  Cardiology Triage

## 2018-11-24 NOTE — Telephone Encounter (Signed)
Spoke with patient, ID confirmed. Pt concerned that her pacer settings were changed. She states that her HR is always in the 70's since generator changeout, and is concerned that it is dangerous for her HR to be higher than it used to be. Reassured patient that her HR is not at a dangerous level, and advised that I will have Dr. Edison Simon review tomorrow and get back to her.    Newt Minion, RN  Cardiology Triage

## 2018-11-25 NOTE — Telephone Encounter (Signed)
Discussed with Dr. Edison Simon, no changes made to pt's device at generator change-out. Will confirm with next download.    Called patient, relayed Dr. Brent Bulla message. Pt became very upset, she is adamant that the setting were changed. She wants Dr. Candiss Norse to be notified that her settings were changed. Explained that a 70 HR is still normal and safe, but the patient insists that it is not a safe HR for her. Advised Dr. Edison Simon, he called patient directly to discuss.    Newt Minion, RN  Cardiology Triage

## 2018-11-27 NOTE — Progress Notes (Signed)
Video Visit Note     I performed this clinical encounter by utilizing a real time telehealth video connection between my location and the patient's location. The patient's location was confirmed during this visit. I obtained verbal consent from the patient to perform this clinical encounter utilizing video and prepared the patient by answering any questions they had about the telehealth interaction.    Brittany Stokes is a 82yr female with a hx of PMR, mitral regurgitation, chronic atrial fibrillation, HTN, SSS s/p dual chamber pacemaker originally implanted 10/16/2011, s/p generator [employer] Accolade by Dr. Edison Simon on 11/20/2018.       CC: device and wound check    Subjective:   Feeling "okay"   Frustrated about experience with [place]  She believes her LRL is now at 16 (instead of 60).  She was upset -after finding out that we cannot review her device data because she does not have the right remote box and will have to wait ~1-2 weeks (see today's telephone encounter for details)  Began showering yesterday  -she admits to picking off the skin glue  Completed her Keflex as prescribed postop  Taking all medications as prescribed; medication reconciliation completed  Denies fever/chills.   Denies surgical site bleeding, hematoma, drainage, erythema, warmth.     ROS: Denies fatigue, weakness, weight gain/loss, dizziness, lightheadedness, chest pain, palpitations, SOB, DOE, cough, edema, falls, syncope/presyncope, bleeding/bruising.        Objective:     General Exam: well developed, well nourished, in no apparent distress.    HEENT: Sclerae are anicteric.   Respiratory: respirations regular, even, and unlabored. No nasal flaring or accessory muscle use. No cough or wheezing.  Extremities: No clubbing, cyanosis or deformities.   Skin: pink, good color. Left upper chest pacemaker incision with residual skin glue. No erythema, hematoma, drainage, bleeding.   Musculoskeletal: ambulates with steady gait without assistive devices  Neurologic/Psychiatric: alert and oriented x4, GCS15 Mood and affect are appropriate.   Lymph: there is no lower extremity edema bilaterally.    Implant registration form 11/20/2018      Assessment and plan:     SSS   s/p dual chamber pacemaker originally implanted 10/16/2011  s/p generator [employer] Accolade by Dr. Edison Simon on 11/20/2018  Incision healing without complication  Discussed self care of incision site, activity, monitoring for s/s complications, when to seek medical attention  Mo with [facility] [employer] has ordered a new remote monitoring box  Patient will set up new remote monitoring box then call me directly so we can discuss remote findings  In-clinic appt 4-6 months    chronic atrial fibrillation  On metoprolol succinate 50mg  PO QD  On digoxin 1101mcg PO QD  CHA2DS2-VASc score 4  Anticoagulated on dabigatran 150mg  PO BID    Mitral regurgitation   Asymptomatic  Follow up with Dr. Candiss Norse    Follow up with Dr. Edison Simon and Dr. Candiss Norse  Follow up with other providers as scheduled      Approximately 20 minutes were spent with the patient, of which more than 50% of the time was spent in counseling and/or coordinating care on pacemaker postop care, device management. The patient understands and agrees with the plan of care outlined.      Fredirick Maudlin, MSN, NP-C, [facility]  Device Clinic Nurse Practitioner  [facility]  [address] [place]  [place], [place] [address]  [phone]

## 2018-11-28 ENCOUNTER — Ambulatory Visit: Payer: Medicare Other

## 2018-11-28 ENCOUNTER — Telehealth: Payer: Self-pay | Admitting: Registered Nurse

## 2018-11-28 DIAGNOSIS — I482 Chronic atrial fibrillation, unspecified: Secondary | ICD-10-CM

## 2018-11-28 DIAGNOSIS — Z950 Presence of cardiac pacemaker: Secondary | ICD-10-CM

## 2018-11-28 DIAGNOSIS — I340 Nonrheumatic mitral (valve) insufficiency: Secondary | ICD-10-CM

## 2018-11-28 DIAGNOSIS — Z4889 Encounter for other specified surgical aftercare: Secondary | ICD-10-CM

## 2018-11-28 NOTE — Telephone Encounter (Signed)
Called patient   3 patient identifiers  Patient ready for VV  Instructed to call [place] to sync new device with Latitude then send manual transmission before our VV today at 1330.   No other questions/concerns

## 2018-11-28 NOTE — Telephone Encounter (Signed)
Received VM from patient at 1015  She is very upset after having talked to [employer] support; she was told that her device was not registered and they cannot help her sync her device to the box.    I texted Mo Messalem with [employer] who was at implant based on implant registration form. He says the Latitude box she has currently is not compatible with her new device. He will order her a new box.     I called the patient. 3 patient identifiers. She is very upset with the care from [place]. She was hoping to put an end to the LRL 60 vs 70 based on the remote data. She is adamant that it is set at 70 based on her heart rate on home BP machine. I offered her an in-clinic visit for device/wound check but she refused. She would like to keep her appointment as video visit and will wait until the remote box arrives. No other questions/concerns

## 2018-12-02 NOTE — Telephone Encounter (Signed)
I have personally reviewed the report and agree with the findings.

## 2018-12-03 ENCOUNTER — Telehealth: Payer: Self-pay | Admitting: Cardiovascular Disease

## 2018-12-03 NOTE — Telephone Encounter (Signed)
Called patient. Verified identity.     Patient states she is not feeling well after her generator change out and she is adamant it is due to her heart rate setting being changed from 60 to 70. She confirms she has been taking all medications as prescribed . She did not take her blood pressure today but states yesterday her blood pressure was 190/110 and she had to take an extra dose of Metoprolol and her blood pressure normalized. Patient clarified that blood pressure spikes are not new but she feels they are happening more often after generator change out.    When I asked patient to elaborate on shortness of breath she states she noticied having a hard time catching her breath.    Patient denies any fevers, chills or swelling to lower extremities. She states pacer site is still clean,dry and intact. She admits to being more stressed over her husband and states she is disappointed by [place] in regards to her heart rate    She is still waiting on new box from [employer].    Melvyn Neth RN, BSN

## 2018-12-03 NOTE — Telephone Encounter (Signed)
pls have dr fan address or see patient in clinic or video visit.    gs

## 2018-12-03 NOTE — Telephone Encounter (Signed)
Patient is calling in and states that she had a battery change out two weeks ago.  States that she has not been feeling well. States that she is having more trouble breathing and high BP.     She would like this message to go to Dr. Candiss Norse and his nurse.  States she doesn't want to talk to Dr. Brent Bulla nurse.     Patient can be reached at [phone].    Thank You,   [employer]

## 2018-12-05 NOTE — Telephone Encounter (Addendum)
Spoke to patient, she expressed frustration with the delay in getting her remote equipment and also the increase in HR she's noticed. She doesn't believe the her lower rate is set at 60 any longer, thinks it's been set to 70. Patient declined for an in office device check as her husband is recovering from an illness right now.   Advised that per the chart notes, [employer] was contacted on 4/17 and a new box was going to be shipped. Patient will call [place] to check on status of her box. She will let us know when it arrives and will send a download. Patient was appreciative of the call and had no other questions.    Lindie Spruce, RN

## 2018-12-08 ENCOUNTER — Telehealth: Payer: Self-pay | Admitting: Registered Nurse

## 2018-12-08 NOTE — Telephone Encounter (Signed)
Received call from patient   3 patient identifiers  Patient is very upset.  She called [employer] and they said her box was never ordered. Indeed, I followed up with Mo from [facility] and it was never ordered as I requested back on 11/28/2018. He has now expedited shipping for her box, arrival 2-3 days. I offered the patient an in clinic appointment and she refused. I will call her on Wednesday as she requests. No other questions/concerns

## 2018-12-10 NOTE — Telephone Encounter (Signed)
Received voicemail from patient that she received her remote box and wants me to call her back.     I called the patient   3 patient identifiers  I reviewed the download from 12/10/2018 at 1356  LRL 60      Patient is still adamant that she is set at 70bpm  I instructed her to call [facility] to discuss her settings and questions  No other questions/concerns

## 2018-12-10 NOTE — Telephone Encounter (Signed)
Received a voicemail from the patient that [place] told her to discuss the settings with her doctor.     I called the patient, LVM to MyChart Dr. Edison Simon regarding her concerns so he can directly reply to her. I will message Dr. Edison Simon to contact her.

## 2018-12-10 NOTE — Telephone Encounter (Signed)
Called patient   3 patient identifiers  Patient still has not received her box.   I explained that it will take 2-3 days per Mo from [employer].  She is upset and believes her LRL is set at 110. "Someone needs to come out today if the box doesn't come". I explained that for safety purposes the [employer] team cannot go to houses for non-urgent checks. I again offered a clinic visit; she declined. I offered to call her on Friday and she agrees. "There will be hell to pay if the box doesn't come by Friday".    No other questions/concerns

## 2018-12-11 ENCOUNTER — Encounter: Payer: Self-pay | Admitting: Cardiovascular Disease

## 2018-12-11 NOTE — Telephone Encounter (Signed)
I have personally reviewed the report and agree with the findings.

## 2018-12-15 NOTE — Progress Notes (Addendum)
Date of Service: 12/11/2018    Following MD: Dr. [name]  Implanting MD: Dr. [name]  Referring MD: Dr. Zenaida Deed    Diagnosis: I49.5 Sick Sinus Syndrome / Brady-Tachy    Device:  [employer] [id number] ACCOLADE MRI EL  Implant date: 11/20/2018  RA Lead: [place] Model 4469 [place] II Sterox EZ  Implant date: 10/16/2011   RV Lead: [place] Model 4470 [place] II Sterox EZ  Implant date: 10/16/2011   Underlying rhythm: AS/VS intermittent atrial fibrillation    Settings:  Mode DDD  Lower rate 60 bpm  Max tracking rate 130 bpm  AV delay 350/350 ms  RA sensing sensitivity 0.2 mV  RV sensing sensitivity 0.6 mV  RA pacing polarity: Bipolar  RV pacing polarity: Bipolar  RA Sense polarity: Bipolar  RV Sense polarity: Bipolar  RA pacing: Amplitude 2.5 V @ Pulse Width 0.4 ms  RV pacing: Amplitude 3.5 V @ Pulse Width 0.4 ms    RA Test:  RA impedance 598 ohms  RA pacing threshold: Amplitude 0.5 V @ Pulse Width 0.4 ms    RV Test:  RV impedance 489 ohms  RV pacing threshold: Amplitude 0.5 V @ Pulse Width 0.4 ms    Battery:  Battery status RRT  Battery remaining longevity 10 years   Battery at OKAY     Percent Paced:  RA percent paced 0 %  RV percent paced 70 %

## 2018-12-15 NOTE — Progress Notes (Signed)
Latitude Remote reviewed on 11 December 2018.  Brittany Stokes had unscheduled remote transmission: for patient initiated.  Patient has a Patent [employer] MRI EL [id number] dual chamber pacemaker. Implanted by Inez Catalina MD for sick sinus syndrome/tachy-bradycardia and followed by [name] MD.    Device function is Normal, Lead function is Normal  Battery status is BOL, with an estimated longevity of 10 years.   Presenting EGM: AS/VS with intermittentd Atrial fibrillation     Arrhythmias/Comments: Since last remote on 21 November 2018  No AMS/ATR episode with an AT/AF Burden of 100% for a total time of 19.0 days since 20 November 2018  No ventricular tachycardia/ventricular fibrillation episodes     Next scheduled remote is July and office visit is April.   Interrogated by: Brittany Stokes HTL II     [name] MD, PH.D, F.A.C.C., F.A.S.E.  [place]  Department of Internal Medicine, Division of Cardiology    (HLT II supervising)    Current Outpatient Medications on File Prior to Visit   Medication Sig Dispense Refill    Conjugated Estrogens (PREMARIN) 0.625 mg Tablet Take 0.625 mg by mouth every day.      Digoxin (LANOXIN) 125 mcg Tablet Take 1 tablet by mouth every day. 90 tablet 3    Hydrochlorothiazide (HYDRO-DIURIL) 25 mg Tablet Take 0.5 tablets by mouth every morning. 45 tablet 3    Metoprolol Succinate (TOPROL XL) 50 mg SR Tablet Take 1 tablet by mouth every day. 90 tablet 3    Naloxone (NARCAN) 4 mg/actuation Nasal Spray Use for opioid overdose. Give 1 spray in nostril. If no response / breathing slows down, repeat every 2 minutes until emergency help arrives. 2 each 0    PRADAXA 150 mg Capsule Take 1 capsule by mouth 2 times daily. 60 capsule 11    PredniSONE (DELTASONE) 1 mg Tablet Take 1 mg by mouth every morning after a meal.      PredniSONE (DELTASONE) 1 mg Tablet Take 5 tablets by mouth every morning after a meal. 150 tablet 11    SYNTHROID 125 mcg Tablet Take 125 mcg by mouth  every morning before a meal.                   No current facility-administered medications on file prior to visit.

## 2018-12-16 NOTE — Progress Notes (Signed)
Opened in error

## 2018-12-16 NOTE — Progress Notes (Signed)
Date of Service: 11/21/2018    Following MD: Dr. [name]  Implanting MD: Dr. Aileen Fass  Referring MD: Dr. Zenaida Deed    Diagnosis: I49.5 Sick Sinus Syndrome / Brady-Tachy    Device:  [place] [id number] ADVANTIO  Implant date: 10/16/2011  RA Lead: [place] Model 4469 [place] II Sterox EZ  Implant date: 10/16/2011   RV Lead: [place] Model 4470 [place] II Sterox EZ  Implant date: 10/16/2011     Settings:  Mode DDD  Lower rate 60 bpm  Max tracking rate 130 bpm  AV delay 350/350 ms  RA sensing sensitivity 0.2 mV  RV sensing sensitivity 0.6 mV  RA pacing polarity: Bipolar  RV pacing polarity: Bipolar  RA Sense polarity: Bipolar  RV Sense polarity: Bipolar  RA pacing: Amplitude 2.5 V @ Pulse Width 0.4 ms  RV pacing: Amplitude 3.5 V @ Pulse Width 0.4 ms    RA Test:  RA impedance 598 ohms  RA pacing threshold: Amplitude 0.5 V @ Pulse Width 0.4 ms    RV Test:  RV impedance 489 ohms  RV pacing threshold: Amplitude 0.5 V @ Pulse Width 0.4 ms    Battery:  Battery status RRT    Percent Paced:  RA percent paced 0 %  RV percent paced 70 %

## 2018-12-16 NOTE — Progress Notes (Signed)
Latitude Remote, reviewed on 11/21/2018    Brittany Stokes had an unscheduled remote transmission.     Pt transmitter sent information from her old device. Pt no longer has the [place] [id number].    This transmission was sent the day after Pt had her device explanteded and replaced.      Interrogated by: Brittany Stokes, HLT II    Brittany Bruin MD, PH.D, F.A.C.C., F.A.S.E.  Brittany Stokes

## 2018-12-17 NOTE — Progress Notes (Signed)
I have personally reviewed the report and agree with the findings.

## 2018-12-24 NOTE — Progress Notes (Signed)
Date of Service: 11/20/2018    Following MD: Dr. [name]  Implanting MD: Dr. [name]  Referring MD: Dr. Zenaida Deed    Diagnosis: I49.5 Sick Sinus Syndrome / Brady-Tachy    Device:  [employer] [phone] ACCOLADE MRI EL  Implant date: 11/20/2018  RA Lead: [place] Model 4469 [place] II Sterox EZ  Implant date: 10/16/2011   RV Lead: [place] Model 4470 [place] II Sterox EZ  Implant date: 10/16/2011     Settings:  Mode DDDR  Lower rate 60 bpm  Max tracking rate 130 bpm  AV delay 135ms-350ms/120ms-350ms   RA sensing sensitivity 0.2 mV  RV sensing sensitivity 0.6 mV  RA pacing: Amplitude 2.5 V @ Pulse Width 2.0 ms  RV pacing: Amplitude 0.4 V @ Pulse Width 0.4 ms    RA Test:  RA impedance 577 ohms  RA sensing 1.4 mV  RA pacing threshold: Amplitude afib  @ Pulse Width afib     RV Test:  RV impedance 451 ohms  RV sensing 20.4 mV  RV pacing threshold: Amplitude 0.5 V @ Pulse Width 0.4 ms

## 2018-12-24 NOTE — Progress Notes (Signed)
Type of procedure: pacemaker generator change out  Implanted by: [name], MD  Referring/Cardiologist: Dr. Candiss Norse  Diagnosis: SSS  Patient type: outpatient  Company: [facility] [employer]  Complications: none  Other comments: none

## 2018-12-30 ENCOUNTER — Ambulatory Visit: Payer: Medicare Other | Admitting: RHEUMATOLOGY

## 2019-01-12 ENCOUNTER — Encounter: Payer: Self-pay | Admitting: Family Medicine

## 2019-01-12 ENCOUNTER — Ambulatory Visit (INDEPENDENT_AMBULATORY_CARE_PROVIDER_SITE_OTHER): Payer: Medicare Other | Admitting: Family Medicine

## 2019-01-12 ENCOUNTER — Other Ambulatory Visit: Payer: Self-pay

## 2019-01-12 VITALS — BP 125/86 | HR 70 | Temp 97.5°F | Ht 63.0 in | Wt 132.0 lb

## 2019-01-12 DIAGNOSIS — N390 Urinary tract infection, site not specified: Secondary | ICD-10-CM | POA: Diagnosis not present

## 2019-01-12 DIAGNOSIS — I5032 Chronic diastolic (congestive) heart failure: Secondary | ICD-10-CM

## 2019-01-12 DIAGNOSIS — Z950 Presence of cardiac pacemaker: Secondary | ICD-10-CM

## 2019-01-12 DIAGNOSIS — K5904 Chronic idiopathic constipation: Secondary | ICD-10-CM

## 2019-01-12 DIAGNOSIS — E038 Other specified hypothyroidism: Secondary | ICD-10-CM | POA: Diagnosis not present

## 2019-01-12 DIAGNOSIS — I10 Essential (primary) hypertension: Secondary | ICD-10-CM | POA: Diagnosis not present

## 2019-01-12 DIAGNOSIS — M353 Polymyalgia rheumatica: Secondary | ICD-10-CM

## 2019-01-12 DIAGNOSIS — Z7901 Long term (current) use of anticoagulants: Secondary | ICD-10-CM

## 2019-01-12 DIAGNOSIS — I4811 Longstanding persistent atrial fibrillation: Secondary | ICD-10-CM | POA: Diagnosis not present

## 2019-01-12 DIAGNOSIS — Z7952 Long term (current) use of systemic steroids: Secondary | ICD-10-CM

## 2019-01-12 DIAGNOSIS — E063 Autoimmune thyroiditis: Secondary | ICD-10-CM

## 2019-01-12 NOTE — Progress Notes (Signed)
Virtual Visit via Video   Due to the COVID-19 pandemic, this visit was completed with telemedicine (audio/video) technology to reduce patient and provider exposure as well as to preserve personal protective equipment.  I connected with Deanna Rivera by a video enabled telemedicine application and verified that I am speaking with the correct person using two identifiers. Location patient: Home Location provider: [facility], Office Persons participating in the virtual visit: Tabby Beaston, Briscoe Deutscher, DO Lonell Grandchild, CMA acting as scribe for Dr. Briscoe Deutscher.   I discussed the limitations of evaluation and management by telemedicine and the availability of in person appointments. The patient expressed understanding and agreed to proceed.  Care Team   Patient Care Team: Briscoe Deutscher, DO as PCP - General (Family Medicine)  Subjective:   HPI: Patient presents for NEW PATIENT appointment. See Assessment and Plan section for Problem Based Charting of issues discussed today.   Review of Systems  Constitutional: Negative for chills and fever.  HENT: Negative for hearing loss and tinnitus.   Eyes: Negative for blurred vision and double vision.  Respiratory: Negative for cough.   Cardiovascular: Negative for chest pain and palpitations.  Gastrointestinal: Negative for nausea and vomiting.  Genitourinary: Positive for frequency and urgency. Negative for dysuria.  Musculoskeletal: Positive for back pain. Negative for neck pain.       Low back both sides   Skin: Negative for rash.  Neurological: Negative for dizziness and headaches.  Endo/Heme/Allergies: Does not bruise/bleed easily.  Psychiatric/Behavioral: Negative for depression and suicidal ideas.    Patient Active Problem List   Diagnosis Date Noted  . Constipation 01/18/2019  . Status post placement of cardiac pacemaker 07/08/2018  . 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 Shellfish allergy 11/22/2015  . Nonrheumatic  mitral valve regurgitation 09/16/2015  . Nontoxic multinodular goiter 09/08/2015  . Current use of long term anticoagulation 05/16/2015  . Frequency of urination 03/14/2015  . Recurrent UTI 03/14/2015  . (HFpEF) heart failure with preserved ejection fraction ([place]) 09/13/2014  . Pacemaker 11/09/2011  . Impaired fasting glucose 05/23/2009  . Cataract 11/22/2008  . Synovial cyst of popliteal space 11/22/2008  . Symptomatic menopausal or female climacteric states 07/19/2008  . Long term current use of systemic steroids 03/30/2008  . Polymyalgia rheumatica ([place]) 03/30/2008  . Myalgia 10/17/2007  . Benign essential hypertension 05/05/2007  . ESR raised 05/05/2007  . Mitral valve disorder 05/05/2007  . Chronic thyroiditis 11/06/2006  . Atrial fibrillation ([place]), Rx Digoxin and Pradaxa 11/06/2006  . Pure hypercholesterolemia 11/06/2006  . Hypothyroidism due to Hashimoto's thyroiditis, Rx Levothyroxine 06/25/2005    Social History   Tobacco Use  . Smoking status: Never Smoker  . Smokeless tobacco: Never Used  Substance Use Topics  . Alcohol use: No    Current Outpatient Medications:  .  dabigatran (PRADAXA) 150 MG CAPS capsule, Take 1 capsule (150 mg total) by mouth every 12 (twelve) hours., Disp: 60 capsule, Rfl: 3 .  digoxin (LANOXIN) 0.25 MG tablet, Take 1 tablet (0.25 mg total) by mouth daily., Disp: 30 tablet, Rfl: 1 .  hydrochlorothiazide (HYDRODIURIL) 25 MG tablet, Take 1 tablet (25 mg total) by mouth daily., Disp: 30 tablet, Rfl: 1 .  levothyroxine (SYNTHROID) 125 MCG tablet, Take 1 tablet (125 mcg total) by mouth daily., Disp: 30 tablet, Rfl: 1 .  predniSONE (DELTASONE) 1 MG tablet, Take 1 tablet (1 mg total) by mouth daily., Disp: 30 tablet, Rfl: 1  Allergies  Allergen Reactions  . Monosodium Glutamate Other (See  Comments)    other  . Epinephrine   . Erythromycin   . Shellfish Allergy Other (See Comments)  . Sulfamethoxazole-Trimethoprim   . Tetracyclines & Related     Objective:   VITALS: Per patient if applicable, see vitals. GENERAL: Alert and in no acute distress. CARDIOPULMONARY: No increased WOB. Speaking in clear sentences.  PSYCH: Pleasant and cooperative. Speech normal rate and rhythm. Affect is appropriate. Insight and judgement are appropriate. Attention is focused, linear, and appropriate.  NEURO: Oriented as arrived to appointment on time with no prompting.   Depression screen [facility] 2/9 01/12/2019  Decreased Interest 0  Down, Depressed, Hopeless 0  PHQ - 2 Score 0  Altered sleeping 3  Tired, decreased energy 0  Change in appetite 0  Feeling bad or failure about yourself  0  Trouble concentrating 0  Moving slowly or fidgety/restless 0  Suicidal thoughts 0  PHQ-9 Score 3  Difficult doing work/chores Not difficult at all   Assessment and Plan:   Hypothyroidism due to Hashimoto's thyroiditis, Rx Levothyroxine Euthyroid. Will recheck labs. Continue Levothyroxine.   Recurrent UTI Usually associated with constipation.   Pacemaker Battery changed 11/20/18. Patient believes that it is set at 70 bmp instead of 60 bpm. [employer]. Needs to become established with Cardiology/EP.   Atrial fibrillation ([place]), Rx Digoxin and Pradaxa Well controlled.  No signs of complications, medication side effects, or red flags.  Continue current regimen.    Constipation Chronic constipation. BM every other day, hard, has to strain. We reviewed a bowel regimen. Will need to get labs.   Polymyalgia rheumatica (HCC) On chronic steroids. Dx at [facility].   Marland Kitchen COVID-19 Education: The signs and symptoms of COVID-19 were discussed with the patient and how to seek care for testing if needed. The importance of social distancing was discussed today. . Reviewed expectations re: course of current medical issues. . Discussed self-management of symptoms. . Outlined signs and symptoms indicating need for more acute intervention. . Patient verbalized  understanding and all questions were answered. Marland Kitchen Health Maintenance issues including appropriate healthy diet, exercise, and smoking avoidance were discussed with patient. . See orders for this visit as documented in the electronic medical record.  Briscoe Deutscher, DO  Records requested if needed. Time spent: 30 minutes, of which >50% was spent in obtaining information about her symptoms, reviewing her previous labs, evaluations, and treatments, counseling her about her condition (please see the discussed topics above), and developing a plan to further investigate it; she had a number of questions which I addressed.

## 2019-01-16 ENCOUNTER — Telehealth: Payer: Self-pay | Admitting: Family Medicine

## 2019-01-16 ENCOUNTER — Other Ambulatory Visit: Payer: Self-pay

## 2019-01-16 MED ORDER — PREDNISONE 1 MG PO TABS: 1.0000 mg | ORAL_TABLET | Freq: Every day | ORAL | 1 refills | Status: DC

## 2019-01-16 MED ORDER — DIGOXIN 250 MCG PO TABS: 0.2500 mg | ORAL_TABLET | Freq: Every day | ORAL | 1 refills | Status: DC

## 2019-01-16 MED ORDER — HYDROCHLOROTHIAZIDE 25 MG PO TABS: 25.0000 mg | ORAL_TABLET | Freq: Every day | ORAL | 1 refills | Status: DC

## 2019-01-16 MED ORDER — LEVOTHYROXINE SODIUM 125 MCG PO TABS: 125.0000 ug | ORAL_TABLET | Freq: Every day | ORAL | 1 refills | Status: DC

## 2019-01-16 MED ORDER — DABIGATRAN ETEXILATE MESYLATE 150 MG PO CAPS: 150.0000 mg | ORAL_CAPSULE | Freq: Two times a day (BID) | ORAL | 3 refills | Status: DC

## 2019-01-16 NOTE — Telephone Encounter (Signed)
Called in.

## 2019-01-16 NOTE — Telephone Encounter (Signed)
Ok to refill 

## 2019-01-16 NOTE — Telephone Encounter (Signed)
Okay to refill all.

## 2019-01-16 NOTE — Telephone Encounter (Signed)
See note

## 2019-01-16 NOTE — Telephone Encounter (Signed)
Copied from [place] [id number]. Topic: Quick Communication - Rx Refill/Question >> Jan 16, 2019 10:32 AM Blase Mess A wrote: Medication: Patient is calling to get a refill medication are not in her chart. She is a new patient of Dr. Juleen China. New Patient appt 01/12/19 Metoprolol ER Succinate Tab Hydrochlorothiazide (HYDRODIURIL) 25 MG tablet [[id number]]  PREDNISONE PO [[id number] digoxin (LANOXIN) 0.25 MG tablet [[id number]]  dabigatran (PRADAXA) 150 MG CAPS [[id number]]  Has the patient contacted their pharmacy? YES (Agent: If no, request that the patient contact the pharmacy for the refill.) (Agent: If yes, when and what did the pharmacy advise?)  Preferred Pharmacy (with phone number or street name): [facility] [id number]  Agent: Please be advised that RX refills may take up to 3 business days. We ask that you follow-up with your pharmacy.

## 2019-01-18 ENCOUNTER — Encounter: Payer: Self-pay | Admitting: Family Medicine

## 2019-01-18 DIAGNOSIS — K5900 Constipation, unspecified: Secondary | ICD-10-CM | POA: Insufficient documentation

## 2019-01-18 NOTE — Assessment & Plan Note (Signed)
Euthyroid. Will recheck labs. Continue Levothyroxine.

## 2019-01-18 NOTE — Assessment & Plan Note (Signed)
Usually associated with constipation.

## 2019-01-18 NOTE — Assessment & Plan Note (Signed)
On chronic steroids. Dx at [facility].

## 2019-01-18 NOTE — Assessment & Plan Note (Signed)
Chronic constipation. BM every other day, hard, has to strain. We reviewed a bowel regimen. Will need to get labs.

## 2019-01-18 NOTE — Assessment & Plan Note (Signed)
Battery changed 11/20/18. Patient believes that it is set at 70 bmp instead of 60 bpm. [employer]. Needs to become established with Cardiology/EP.

## 2019-01-18 NOTE — Assessment & Plan Note (Signed)
Well controlled.  No signs of complications, medication side effects, or red flags.  Continue current regimen.   

## 2019-01-21 ENCOUNTER — Ambulatory Visit: Payer: Self-pay

## 2019-01-21 NOTE — Telephone Encounter (Signed)
Pt called stating that she had her heart rate excellerate last night.  She states she stays in [facility]. She went to bed and this am did not feel well.  She took her BP and it was 225/125.  She took double her medication Metoprolol.  She states she lives at assisted living and her BP has come down per physical therapist that visited today but she is unsure what the numbers were.  She thinks 168/?.  HR was 109. Patient denies chest pain and weakness on one side of her body. Per protocol pt was instructed to go to the ER for evaluation. Pt verbalized understanding of all instructions. Note will be routed to office  Reason for Disposition . [7] Systolic BP  >= 062 OR Diastolic >= 376 AND [2] cardiac or neurologic symptoms (e.g., chest pain, difficulty breathing, unsteady gait, blurred vision)  Answer Assessment - Initial Assessment Questions 1. BLOOD PRESSURE: "What is the blood pressure?" "Did you take at least two measurements 5 minutes apart?"     225/125 last night 2. ONSET: "When did you take your blood pressure?"     Last night 3. HOW: "How did you obtain the blood pressure?" (e.g., visiting nurse, automatic home BP monitor)     home 4. HISTORY: "Do you have a history of high blood pressure?"     htn, a fib 5. MEDICATIONS: "Are you taking any medications for blood pressure?" "Have you missed any doses recently?"    Unsure  6. OTHER SYMPTOMS: "Do you have any symptoms?" (e.g., headache, chest pain, blurred vision, difficulty breathing, weakness)     no 7. PREGNANCY: "Is there any chance you are pregnant?" "When was your last menstrual period?"    N/A  Protocols used: HIGH BLOOD PRESSURE-A-AH

## 2019-01-22 ENCOUNTER — Telehealth: Payer: Self-pay

## 2019-01-22 ENCOUNTER — Telehealth: Payer: Self-pay | Admitting: Internal Medicine

## 2019-01-22 ENCOUNTER — Telehealth: Payer: Self-pay | Admitting: Cardiovascular Disease

## 2019-01-22 NOTE — Telephone Encounter (Signed)
Copied from [place] [id number]. Topic: General - Other >> Jan 22, 2019  9:58 AM Yvette Rack wrote: Reason for CRM: Pt stated she would like to have a urine specimen and labs ordered. Pt also requests a referral to a cardiologist.

## 2019-01-22 NOTE — Telephone Encounter (Signed)
Called patient she wanted to know if she needed to get labs done. Referral has been made to Cardiology already. I have given her contact information to call office to make that app. She did not go to ED (pluged up her monitor and did phone call with cardio in [place])

## 2019-01-22 NOTE — Telephone Encounter (Signed)
New message:    patient calling to see if she can get a appt with her husband she will new as well. They live at nursing home and need to come together. Also she would like to come on a Tuesday for transportation. Please call to discuss this appt.

## 2019-01-22 NOTE — Telephone Encounter (Signed)
Pt calling in again and wants to know if threre was any damage done to her heart on 01/20/19. She states that for 12hr period her heart rate was 225/125. Pt wants to know if the download for that is available. Please advise

## 2019-01-23 ENCOUNTER — Ambulatory Visit: Payer: Medicare Other | Attending: Cardiovascular Disease

## 2019-01-23 ENCOUNTER — Ambulatory Visit: Payer: Medicare Other | Admitting: Physical Medicine & Rehabilitation

## 2019-01-23 ENCOUNTER — Telehealth: Payer: Self-pay | Admitting: Cardiovascular Disease

## 2019-01-23 DIAGNOSIS — I495 Sick sinus syndrome: Secondary | ICD-10-CM

## 2019-01-23 DIAGNOSIS — Z45018 Encounter for adjustment and management of other part of cardiac pacemaker: Secondary | ICD-10-CM | POA: Insufficient documentation

## 2019-01-23 NOTE — Telephone Encounter (Signed)
Pt returning call from the nurse

## 2019-01-23 NOTE — Progress Notes (Signed)
Latitude Remote reviewed on 01/23/2019  Brittany Stokes had a sheduled remote transmission and a patient initiated, She states that for a recent 12hr period her heart rate was 225/125bpm.   Patient has a Patent attorney MRI EL L331 dual chamber pacemaker. Implanted by Inez Catalina MD for sick sinus syndrome/tachy-bradycardia and followed by [name] MD.    Device function is Normal, Lead function is Normal  Battery status is BOL, with an estimated longevity of 10 years.   Presenting EGM: AFib/Vp at 70bpm.    Ap 0%  Vp 84%    Arrhythmias/Comments: Since last remote on 12/11/18  1  Atrial fibrillation/Vp at 70bpm episode currently ongoing.  Burden of 100% for a total time of 62 days since before implant 11/20/2018  During Atrial fib Ventricular rates have been between 72-168bpm.     No ventricular tachycardia/ventricular fibrillation episodes.    Pt called into clinic regarding rapid heart rates I will forward this report to Dr Edison Simon and cardiology triage nursing staff.    Next scheduled remote is September and office visit is September.  Interrogated by: Rulon Abide HLT II.    [name] MD, PH.D, F.A.C.C., F.A.S.E.  [place]  Department of Internal Medicine, Division of Cardiology      Current Outpatient Medications:     Conjugated Estrogens (PREMARIN) 0.625 mg Tablet, Take 0.625 mg by mouth every day., Disp: , Rfl:     Digoxin (LANOXIN) 125 mcg Tablet, Take 1 tablet by mouth every day., Disp: 90 tablet, Rfl: 3    Hydrochlorothiazide (HYDRO-DIURIL) 25 mg Tablet, Take 0.5 tablets by mouth every morning., Disp: 45 tablet, Rfl: 3    Metoprolol Succinate (TOPROL XL) 50 mg SR Tablet, Take 1 tablet by mouth every day., Disp: 90 tablet, Rfl: 3    Naloxone (NARCAN) 4 mg/actuation Nasal Spray, Use for opioid overdose. Give 1 spray in nostril. If no response / breathing slows down, repeat every 2 minutes until emergency help arrives., Disp: 2 each, Rfl: 0    PRADAXA 150 mg Capsule,  Take 1 capsule by mouth 2 times daily., Disp: 60 capsule, Rfl: 11    PredniSONE (DELTASONE) 1 mg Tablet, Take 1 mg by mouth every morning after a meal., Disp: , Rfl:     PredniSONE (DELTASONE) 1 mg Tablet, Take 5 tablets by mouth every morning after a meal., Disp: 150 tablet, Rfl: 11    SYNTHROID 125 mcg Tablet, Take 125 mcg by mouth every morning before a meal.        , Disp: , Rfl:

## 2019-01-23 NOTE — Telephone Encounter (Signed)
Spoke with patient, ID confirmed x 3. Discussed patient's device readings, per patient she was calling to report high BP, not high HR. Pt had one time reading of 225/125, felt awful at the time, but has not had any other readings that high. Her typical SBP's are 130's. Pt is concerned that her heart may have been damaged while her blood pressure was high, explained that a one-time elevation is unlikely to cause damage. Pt reports that she moved to [place], advised her to let us know when she establishes with a new cardiologist so we can transfer her device downloads to them. She verbalized understanding, had no further questions.    Newt Minion, RN  Cardiology Triage

## 2019-01-23 NOTE — Telephone Encounter (Signed)
Response to Latitude remote transmission sent by  [name] 01/22/19.     Brittany Stokes had a sheduled remote and a patient initiated transmission, She states that for a recent 12hr period her heart rate was 225/125bpm.     Patient has a Patent attorney MRI EL L331 dual chamber pacemaker. Implanted by Inez Catalina MD for sick sinus syndrome/tachy-bradycardia and followed by [name] MD.    Arrhythmias/Comments: Since last remote on 12/11/18  1  Atrial fibrillation episode currently ongoing, Ventricular rate is 70bpm.  Atrial Burden of 100% for a total time of 62 days since before implant 11/20/2018  During Atrial fib Ventricular rates have been between 72-168bpm.  Pt has a history of Afib with RVR to 180'sbpm.  Full report is in EMR.    Kathey Kellems-Ross HLT II.

## 2019-01-23 NOTE — Progress Notes (Signed)
Date of Service: 01/22/2019    Following MD: Dr. [name]  Implanting MD: Dr. [name]  Referring MD: Dr. Zenaida Deed    Diagnosis: I49.5 Sick Sinus Syndrome / Brady-Tachy    Device:  [employer] [id number] ACCOLADE MRI EL  Implant date: 11/20/2018  RA Lead: [place] Model 4469 [place] II Sterox EZ  Implant date: 10/16/2011   RV Lead: [place] Model 4470 [place] II Sterox EZ  Implant date: 10/16/2011     Settings:  Mode DDDR  Lower rate 60 bpm  Max tracking rate 130 bpm  Max sensor rate 130 bpm  AV delay 350/350 ms  RA sensing sensitivity 0.2 mV  RV sensing sensitivity 0.6 mV  RA pacing polarity: Bipolar  RV pacing polarity: Bipolar  RA Sense polarity: Bipolar  RV Sense polarity: Bipolar  RA pacing: Amplitude 2.5 V @ Pulse Width 0.4 ms  RV pacing: Amplitude 1.1 V @ Pulse Width 0.4 ms    RA Test:  RA impedance 536 ohms    RV Test:  RV impedance 435 ohms  RV pacing threshold: Amplitude 0.6 V @ Pulse Width 0.4 ms    Battery:  Battery status BOS  Battery remaining longevity 114 months    Percent Paced:  RA percent paced 0 %  RV percent paced 84 %

## 2019-01-27 NOTE — Telephone Encounter (Signed)
Was sent to me by mistake do not see where was seen in ED

## 2019-01-27 NOTE — Progress Notes (Signed)
I have personally reviewed the report and agree with the findings.

## 2019-01-27 NOTE — Progress Notes (Signed)
Latitude  Remote reviewed on 11/04/2018.  Brittany Stokes had an unscheduled  remote transmission: for ERI.  Patient has a Nurse, mental health. Implanted by Brittany Cheek MD for sick sinus syndrome/Tachy-Brady .    Device function is normal, Lead function is noraml  Battery status is at ERI as of 09/04/2018 and schedule for change out on 11/20/2018.   Presenting EGM:  Atrial fibrillation /Vp    Arrhythmias/Comments: Since last office visit on 08/29/2018   1 atrial fibrillation episodes which the EGM showed atrial fibrillation with ventricular rate of 70-120 bpm  18 Nonsustained ventricular tachycardia episode which the EGM showed atrial fibrillation with rapid ventricular response at 160-195 bpm for 4-6 seconds in duration (9 were on 10/24/2018)    Next scheduled remote is every 3 months after change out and office visit is 7-10 days after change out.   Interrogated by: Brittany Stokes CCT, CRAT, HLT II     Brittany Bruin MD, PH.D, F.A.C.C., F.A.S.E.  [facility]  Department of Internal Medicine, Division of Cardiology    (HLT II supervising)    Current Outpatient Medications on File Prior to Visit   Medication Sig Dispense Refill    Conjugated Estrogens (PREMARIN) 0.625 mg Tablet Take 0.625 mg by mouth every day.      Digoxin (LANOXIN) 125 mcg Tablet Take 1 tablet by mouth every day. 90 tablet 3    Hydrochlorothiazide (HYDRO-DIURIL) 25 mg Tablet Take 0.5 tablets by mouth every morning. 45 tablet 3    Metoprolol Succinate (TOPROL XL) 50 mg SR Tablet Take 1 tablet by mouth every day. 90 tablet 3    Naloxone (NARCAN) 4 mg/actuation Nasal Spray Use for opioid overdose. Give 1 spray in nostril. If no response / breathing slows down, repeat every 2 minutes until emergency help arrives. 2 each 0    PRADAXA 150 mg Capsule Take 1 capsule by mouth 2 times daily. 60 capsule 11    PredniSONE (DELTASONE) 1 mg Tablet Take 1 mg by mouth every morning after a meal.       PredniSONE (DELTASONE) 1 mg Tablet Take 5 tablets by mouth every morning after a meal. 150 tablet 11    SYNTHROID 125 mcg Tablet Take 125 mcg by mouth every morning before a meal.                   No current facility-administered medications on file prior to visit.

## 2019-01-27 NOTE — Progress Notes (Signed)
Date of Service: 11/04/2018    Following MD: Dr. [name]  Implanting MD: Dr. Aileen Fass  Referring MD: Dr. Zenaida Deed    Diagnosis: I49.5 Sick Sinus Syndrome / Brady-Tachy    Device:  [place] [id number] ADVANTIO  Implant date: 10/16/2011    Settings:  Mode DDDR  Lower rate 60 bpm  Max tracking rate 130 bpm  Max sensor rate 130 bpm  AV delay 350/350 ms  RA sensing sensitivity 0.2 mV  RV sensing sensitivity 0.6 mV  RA pacing polarity: Bipolar  RV pacing polarity: Bipolar  RA Sense polarity: Bipolar  RV Sense polarity: Bipolar  RA pacing: Amplitude 2.0 V @ Pulse Width 0.4 ms  RV pacing: Amplitude 1.1 V @ Pulse Width 0.4 ms    RA Test:  RA impedance 545 ohms  RA sensing 0.7 mV  RA pacing threshold: Amplitude 0.5 V @ Pulse Width 0.4 ms    RV Test:  RV impedance 428 ohms  RV sensing 18.2 mV  RV pacing threshold: Amplitude 0.6 V @ Pulse Width 0.4 ms    Battery:  Battery status RRT  Battery at ERI as of 09/04/2018     Percent Paced:  RA percent paced 0 %  RV percent paced 70 %

## 2019-01-28 ENCOUNTER — Telehealth: Payer: Self-pay | Admitting: Family Medicine

## 2019-01-28 NOTE — Telephone Encounter (Signed)
Pt is calling and needs new rx [facility]. Pt needs levothyroxine 125 mcg, hctz 25 mg, digoxin 0.25 mg, prednisone 1 mg, pradaxa 150 mg x twice a day, and the metoprolol  succinate er 50 mg. This med was prescribed by her old doctor in [place]. Pt would like 90 day supply with refills

## 2019-01-29 ENCOUNTER — Telehealth: Payer: Self-pay | Admitting: Family Medicine

## 2019-01-29 ENCOUNTER — Other Ambulatory Visit: Payer: Self-pay

## 2019-01-29 MED ORDER — LEVOTHYROXINE SODIUM 125 MCG PO TABS: 125.0000 ug | ORAL_TABLET | Freq: Every day | ORAL | 1 refills | Status: DC

## 2019-01-29 MED ORDER — DABIGATRAN ETEXILATE MESYLATE 150 MG PO CAPS: 150.0000 mg | ORAL_CAPSULE | Freq: Two times a day (BID) | ORAL | 3 refills | Status: DC

## 2019-01-29 MED ORDER — PREDNISONE 1 MG PO TABS: 1.0000 mg | ORAL_TABLET | Freq: Every day | ORAL | 1 refills | Status: DC

## 2019-01-29 MED ORDER — DIGOXIN 250 MCG PO TABS: 0.2500 mg | ORAL_TABLET | Freq: Every day | ORAL | 1 refills | Status: DC

## 2019-01-29 MED ORDER — HYDROCHLOROTHIAZIDE 25 MG PO TABS: 25.0000 mg | ORAL_TABLET | Freq: Every day | ORAL | 1 refills | Status: DC

## 2019-01-29 NOTE — Telephone Encounter (Signed)
Medication: metoprolol  succinate er 50 mg -pt does not need refill immediately. Just wants to have authorization completed before she does need it.   Has the patient contacted their pharmacy? Yes (Agent: If no, request that the patient contact the pharmacy for the refill.) (Agent: If yes, when and what did the pharmacy advise?)  Preferred Pharmacy (with phone number or street name): [place] [address] [place] [address] [place] [address] Phone: [phone] Fax: [phone]    Agent: Please be advised that RX refills may take up to 3 business days. We ask that you follow-up with your pharmacy.

## 2019-01-29 NOTE — Telephone Encounter (Signed)
Patients says she takes the medication for blood pressure. Just moved to [facility] from [place]. Please advise.

## 2019-01-29 NOTE — Progress Notes (Signed)
Left voicemail message requesting a c/b from patient.  This medication is not listed on patient's medication list as a current medication nor in her history.

## 2019-01-29 NOTE — Telephone Encounter (Signed)
Left voicemail message on patient's home phone requesting a call back regarding rx refill request.  (This medication is not listed in patient's chart)

## 2019-01-29 NOTE — Telephone Encounter (Signed)
Patient informed no new questions.

## 2019-01-30 NOTE — Telephone Encounter (Signed)
I spoke with Dr. Juleen China about the medication and she gave permission to send this Rx in for patient. Metoprolol Succinate er 50mg  I spoke with patient and she just wanted this med added to her list of medication.

## 2019-02-11 ENCOUNTER — Other Ambulatory Visit: Payer: Self-pay

## 2019-02-11 ENCOUNTER — Encounter: Payer: Self-pay | Admitting: Internal Medicine

## 2019-02-11 ENCOUNTER — Ambulatory Visit (INDEPENDENT_AMBULATORY_CARE_PROVIDER_SITE_OTHER): Payer: Medicare Other | Admitting: Internal Medicine

## 2019-02-11 VITALS — BP 138/74 | HR 73 | Ht 63.0 in | Wt 132.0 lb

## 2019-02-11 DIAGNOSIS — Z950 Presence of cardiac pacemaker: Secondary | ICD-10-CM

## 2019-02-11 DIAGNOSIS — I495 Sick sinus syndrome: Secondary | ICD-10-CM | POA: Diagnosis not present

## 2019-02-11 DIAGNOSIS — I5032 Chronic diastolic (congestive) heart failure: Secondary | ICD-10-CM

## 2019-02-11 DIAGNOSIS — I4811 Longstanding persistent atrial fibrillation: Secondary | ICD-10-CM | POA: Diagnosis not present

## 2019-02-11 DIAGNOSIS — I340 Nonrheumatic mitral (valve) insufficiency: Secondary | ICD-10-CM

## 2019-02-11 LAB — CUP PACEART INCLINIC DEVICE CHECK
Brady Statistic RA Percent Paced: 0 %
Brady Statistic RV Percent Paced: 85 %
Date Time Interrogation Session: 20200701040000
Implantable Lead Implant Date: 20130305
Implantable Lead Implant Date: 20130305
Implantable Lead Location: 753859
Implantable Lead Location: 753860
Implantable Lead Model: 4469
Implantable Lead Model: 4470
Implantable Lead Serial Number: 546229
Implantable Lead Serial Number: 712016
Implantable Pulse Generator Implant Date: 20200409
Lead Channel Impedance Value: 453 Ohm
Lead Channel Impedance Value: 535 Ohm
Lead Channel Pacing Threshold Amplitude: 0.5 V
Lead Channel Pacing Threshold Pulse Width: 0.4 ms
Lead Channel Sensing Intrinsic Amplitude: 1.3 mV
Lead Channel Sensing Intrinsic Amplitude: 20 mV
Lead Channel Setting Pacing Amplitude: 1 V
Lead Channel Setting Pacing Amplitude: 2.5 V
Lead Channel Setting Pacing Pulse Width: 0.4 ms
Lead Channel Setting Sensing Sensitivity: 0.6 mV
Pulse Gen Serial Number: 859007

## 2019-02-11 NOTE — Progress Notes (Signed)
Deanna Deutscher, DO: Primary Cardiologist:  Dr Zola Button is a 85 y.o. female with a h/o bradycardia sp PPM (BSci) in [place] who presents today to establish care in the Electrophysiology device clinic. The patient has a h/o sick sinus syndrome.  Recently ERI and s/p generator change this year.  The patient has afib and is on pradaxa.  She also has a h/o severe MR.  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  She is on chronic steroids and uses a cane to get round. The patient reports doing very well since having a pacemaker implanted and remains very active despite her age.   Today, she  denies symptoms of palpitations, chest pain, shortness of breath, orthopnea, PND, lower extremity edema, dizziness, presyncope, syncope, or neurologic sequela.  The patientis tolerating medications without difficulties and is otherwise without complaint today.   Past Medical History:  Diagnosis Date  . Atrial fibrillation ([place])   . Hypertension    Past Surgical History:  Procedure Laterality Date  . ABDOMINAL HYSTERECTOMY    . CESAREAN SECTION    . CHOLECYSTECTOMY    . PILONIDAL CYST EXCISION      Social History   Socioeconomic History  . Marital status: Married    Spouse name: Not on file  . Number of children: Not on file  . Years of education: Not on file  . Highest education level: Not on file  Occupational History  . Not on file  Social Needs  . Financial resource strain: Not on file  . Food insecurity    Worry: Not on file    Inability: Not on file  . Transportation needs    Medical: Not on file    Non-medical: Not on file  Tobacco Use  . Smoking status: Never Smoker  . Smokeless tobacco: Never Used  Substance and Sexual Activity  . Alcohol use: No  . Drug use: No  . Sexual activity: Not on file  Lifestyle  . Physical activity    Days per week: 0 days    Minutes per session: 0 min  . Stress: Not on file  Relationships  . Social Herbalist  on phone: Not on file    Gets together: Not on file    Attends religious service: Not on file    Active member of club or organization: Not on file    Attends meetings of clubs or organizations: Not on file    Relationship status: Not on file  . Intimate partner violence    Fear of current or ex partner: Not on file    Emotionally abused: Not on file    Physically abused: Not on file    Forced sexual activity: Not on file  Other Topics Concern  . Not on file  Social History Narrative   Recently moved from [place]. In independent living with her husband. Daughter died from scleroderma complications. Hx of falls, but last was > 1.5 years ago. 01/18/2019     Family History  Problem Relation Age of Onset  . Early death Mother 8  . Hypertension Brother   . Cirrhosis Father 62  . Lung cancer Brother 82  . Scleroderma Daughter     Allergies  Allergen Reactions  . Monosodium Glutamate Other (See Comments)    other  . Epinephrine   . Erythromycin   . Shellfish Allergy Other (See Comments)  . Sulfamethoxazole-Trimethoprim   . Tetracyclines & Related  Current Outpatient Medications  Medication Sig Dispense Refill  . dabigatran (PRADAXA) 150 MG CAPS capsule Take 1 capsule (150 mg total) by mouth every 12 (twelve) hours. 180 capsule 3  . digoxin (LANOXIN) 0.25 MG tablet Take 1 tablet (0.25 mg total) by mouth daily. 90 tablet 1  . hydrochlorothiazide (HYDRODIURIL) 25 MG tablet Take 1 tablet (25 mg total) by mouth daily. (Patient taking differently: Take 12.5 mg by mouth daily. ) 90 tablet 1  . levothyroxine (SYNTHROID) 125 MCG tablet Take 1 tablet (125 mcg total) by mouth daily. 90 tablet 1  . metoprolol tartrate (LOPRESSOR) 50 MG tablet Take 50 mg by mouth daily.    . predniSONE (DELTASONE) 1 MG tablet Take 1 tablet (1 mg total) by mouth daily. 90 tablet 1   No current facility-administered medications for this visit.     ROS- all systems are reviewed and negative except as  per HPI  Physical Exam: Vitals:   02/11/19 1550  BP: 138/74  Pulse: 73  SpO2: 98%  Weight: 132 lb (59.9 kg)  Height: 5\' 3"  (1.6 m)    GEN- The patient is well appearing, alert and oriented x 3 today.   Head- normocephalic, atraumatic Eyes-  Sclera clear, conjunctiva pink Ears- hearing intact Oropharynx- clear Neck- supple,   Lungs-   normal work of breathing Chest- pacemaker pocket is well healed Heart- irregular rate and rhythm  GI- soft  Extremities- no clubbing, cyanosis, or edema MS- diffuse atrophy Skin- no rash or lesion Psych- euthymic mood, full affect Neuro- strength and sensation are intact Additional exam not performed due to social distancing  Pacemaker interrogation- reviewed in detail today,  See PACEART report ekg reveals afib with demand V pacing Assessment and Plan:  1. Sick sinus syndrome Normal pacemaker function See Pace Art report No changes today she is not device dependant today  2. Persistent afib Rate controlled On anticoagulation with pradaxa.  I do not have labs to assess appropriate dose of pradaxa.  She wishes to have labs followed by primary care.  3. HTN Stable No change required today  4. Mitral regurgitation Echo to evaluate prior to return  lattitude Return every 6 months to see EP PA I will see when needed  Thompson Grayer MD, [place] 02/11/2019 4:32 PM

## 2019-02-11 NOTE — Patient Instructions (Addendum)
Medication Instructions:  Your physician recommends that you continue on your current medications as directed. Please refer to the Current Medication list given to you today.  Labwork: None ordered.  Testing/Procedures: Your physician has requested that you have an echocardiogram. Echocardiography is a painless test that uses sound waves to create images of your heart. It provides your doctor with information about the size and shape of your heart and how well your heart's chambers and valves are working. This procedure takes approximately one hour. There are no restrictions for this procedure.    Follow-Up: Your physician wants you to follow-up in: 6 months with Oda Kilts, PA.   You will receive a reminder letter in the mail two months in advance. If you don't receive a letter, please call our office to schedule the follow-up appointment.  Remote monitoring is used to monitor your Pacemaker from home. This monitoring reduces the number of office visits required to check your device to one time per year. It allows [place] to keep an eye on the functioning of your device to ensure it is working properly. You are scheduled for a device check from home on 05/13/2019. You may send your transmission at any time that day. If you have a wireless device, the transmission will be sent automatically. After your physician reviews your transmission, you will receive a postcard with your next transmission date.  Any Other Special Instructions Will Be Listed Below (If Applicable).  If you need a refill on your cardiac medications before your next appointment, please call your pharmacy.

## 2019-02-16 ENCOUNTER — Encounter: Payer: PRIVATE HEALTH INSURANCE | Admitting: Internal Medicine

## 2019-02-23 ENCOUNTER — Ambulatory Visit (INDEPENDENT_AMBULATORY_CARE_PROVIDER_SITE_OTHER): Payer: Medicare Other | Admitting: Family Medicine

## 2019-02-23 ENCOUNTER — Other Ambulatory Visit: Payer: Self-pay

## 2019-02-23 ENCOUNTER — Encounter: Payer: Self-pay | Admitting: Family Medicine

## 2019-02-23 ENCOUNTER — Ambulatory Visit: Payer: Self-pay | Admitting: Cardiovascular Disease

## 2019-02-23 VITALS — Temp 97.6°F | Ht 63.0 in | Wt 132.0 lb

## 2019-02-23 DIAGNOSIS — R350 Frequency of micturition: Secondary | ICD-10-CM

## 2019-02-23 DIAGNOSIS — E063 Autoimmune thyroiditis: Secondary | ICD-10-CM

## 2019-02-23 DIAGNOSIS — E038 Other specified hypothyroidism: Secondary | ICD-10-CM

## 2019-02-23 DIAGNOSIS — E065 Other chronic thyroiditis: Secondary | ICD-10-CM

## 2019-02-23 DIAGNOSIS — E7800 Pure hypercholesterolemia, unspecified: Secondary | ICD-10-CM

## 2019-02-23 NOTE — Progress Notes (Signed)
Virtual Visit via Video   Due to the COVID-19 pandemic, this visit was completed with telemedicine (audio/video) technology to reduce patient and provider exposure as well as to preserve personal protective equipment.   I connected with Deanna Rivera by a video enabled telemedicine application and verified that I am speaking with the correct person using two identifiers. Location patient: Home Location provider: [facility], Office Persons participating in the virtual visit: Aleighya Mcanelly, Briscoe Deutscher, DO Lonell Grandchild, CMA acting as scribe for Dr. Briscoe Deutscher.   I discussed the limitations of evaluation and management by telemedicine and the availability of in person appointments. The patient expressed understanding and agreed to proceed.  Care Team   Patient Care Team: Briscoe Deutscher, DO as PCP - General (Family Medicine)  Subjective:   HPI: Patient has had increased urgency and frequency. She does not feel like she has the pain with UTI she has had in the past. She has had cloudy urine off and on. She has some leaking of urine when she changes position. For example when she pushes to get up with walker. Symptoms have stared over two months ago.   Review of Systems  Constitutional: Negative for chills and fever.  HENT: Negative for hearing loss and tinnitus.   Eyes: Negative for blurred vision and double vision.  Respiratory: Negative for cough and wheezing.   Cardiovascular: Negative for chest pain, palpitations and leg swelling.  Gastrointestinal: Negative for nausea and vomiting.  Genitourinary: Positive for frequency and urgency. Negative for dysuria.  Musculoskeletal: Negative for myalgias.  Skin: Negative for rash.  Neurological: Negative for tingling and headaches.  Psychiatric/Behavioral: Negative for depression and suicidal ideas.    Patient Active Problem List   Diagnosis Date Noted  . SSS (sick sinus syndrome) ([place]) 02/11/2019  . Constipation 01/18/2019    . Status post placement of cardiac pacemaker 07/08/2018  . 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 Shellfish allergy 11/22/2015  . Nonrheumatic mitral valve regurgitation 09/16/2015  . Nontoxic multinodular goiter 09/08/2015  . Current use of long term anticoagulation 05/16/2015  . Frequency of urination 03/14/2015  . Recurrent UTI 03/14/2015  . (HFpEF) heart failure with preserved ejection fraction ([place]) 09/13/2014  . Pacemaker 11/09/2011  . Impaired fasting glucose 05/23/2009  . Cataract 11/22/2008  . Synovial cyst of popliteal space 11/22/2008  . Symptomatic menopausal or female climacteric states 07/19/2008  . Long term current use of systemic steroids 03/30/2008  . Polymyalgia rheumatica ([place]) 03/30/2008  . Myalgia 10/17/2007  . Benign essential hypertension 05/05/2007  . ESR raised 05/05/2007  . Mitral valve disorder 05/05/2007  . Chronic thyroiditis 11/06/2006  . Atrial fibrillation ([place]), Rx Digoxin and Pradaxa 11/06/2006  . Pure hypercholesterolemia 11/06/2006  . Hypothyroidism due to Hashimoto's thyroiditis, Rx Levothyroxine 06/25/2005    Social History   Tobacco Use  . Smoking status: Never Smoker  . Smokeless tobacco: Never Used  Substance Use Topics  . Alcohol use: No   Current Outpatient Medications:  .  dabigatran (PRADAXA) 150 MG CAPS capsule, Take 1 capsule (150 mg total) by mouth every 12 (twelve) hours., Disp: 180 capsule, Rfl: 3 .  digoxin (LANOXIN) 0.25 MG tablet, Take 1 tablet (0.25 mg total) by mouth daily., Disp: 90 tablet, Rfl: 1 .  hydrochlorothiazide (HYDRODIURIL) 25 MG tablet, Take 1 tablet (25 mg total) by mouth daily. (Patient taking differently: Take 12.5 mg by mouth daily. ), Disp: 90 tablet, Rfl: 1 .  levothyroxine (SYNTHROID) 125 MCG tablet, Take 1 tablet (125 mcg  total) by mouth daily., Disp: 90 tablet, Rfl: 1 .  metoprolol tartrate (LOPRESSOR) 50 MG tablet, Take 50 mg by mouth daily., Disp: , Rfl:  .  predniSONE (DELTASONE) 1 MG tablet, Take  1 tablet (1 mg total) by mouth daily., Disp: 90 tablet, Rfl: 1  Allergies  Allergen Reactions  . Monosodium Glutamate Other (See Comments)    other  . Epinephrine   . Erythromycin   . Shellfish Allergy Other (See Comments)  . Sulfamethoxazole-Trimethoprim   . Tetracyclines & Related    Objective:   VITALS: Per patient if applicable, see vitals. GENERAL: Alert, appears well and in no acute distress. HEENT: Atraumatic, conjunctiva clear, no obvious abnormalities on inspection of external nose and ears. NECK: Normal movements of the head and neck. CARDIOPULMONARY: No increased WOB. Speaking in clear sentences. I:E ratio WNL.  MS: Moves all visible extremities without noticeable abnormality. PSYCH: Pleasant and cooperative, well-groomed. Speech normal rate and rhythm. Affect is appropriate. Insight and judgement are appropriate. Attention is focused, linear, and appropriate.  NEURO: CN grossly intact. Oriented as arrived to appointment on time with no prompting. Moves both UE equally.  SKIN: No obvious lesions, wounds, erythema, or cyanosis noted on face or hands.  Depression screen [facility] 2/9 01/12/2019  Decreased Interest 0  Down, Depressed, Hopeless 0  PHQ - 2 Score 0  Altered sleeping 3  Tired, decreased energy 0  Change in appetite 0  Feeling bad or failure about yourself  0  Trouble concentrating 0  Moving slowly or fidgety/restless 0  Suicidal thoughts 0  PHQ-9 Score 3  Difficult doing work/chores Not difficult at all   Assessment and Plan:   Deanna Rivera was seen today for urinary tract infection.  Diagnoses and all orders for this visit:  Urinary frequency Comments: Patient will be coming into the office tomorrow with her husband for his appointment. Will obtain UA, UCx, and labs. Orders: -     Urinalysis, Routine w reflex microscopic -     Urine Culture -     Cancel: CBC with Differential/Platelet -     Cancel: Comprehensive metabolic panel -     Cancel: Lipid panel -      Cancel: TSH -     Cancel: T4, free -     CBC with Differential/Platelet; Future -     Comprehensive metabolic panel; Future  Chronic thyroiditis -     Cancel: CBC with Differential/Platelet -     Cancel: Comprehensive metabolic panel -     Cancel: Lipid panel -     Cancel: TSH -     Cancel: T4, free  Hypothyroidism due to Hashimoto's thyroiditis, Rx Levothyroxine -     T4, free; Future -     TSH; Future  Pure hypercholesterolemia -     Lipid panel; Future  . COVID-19 Education: The signs and symptoms of COVID-19 were discussed with the patient and how to seek care for testing if needed. The importance of social distancing was discussed today. . Reviewed expectations re: course of current medical issues. . Discussed self-management of symptoms. . Outlined signs and symptoms indicating need for more acute intervention. . Patient verbalized understanding and all questions were answered. Marland Kitchen Health Maintenance issues including appropriate healthy diet, exercise, and smoking avoidance were discussed with patient. . See orders for this visit as documented in the electronic medical record.  Briscoe Deutscher, DO  Records requested if needed. Time spent: 25 minutes, of which >50% was spent in obtaining information about  her symptoms, reviewing her previous labs, evaluations, and treatments, counseling her about her condition (please see the discussed topics above), and developing a plan to further investigate it; she had a number of questions which I addressed.

## 2019-02-24 LAB — URINALYSIS, ROUTINE W REFLEX MICROSCOPIC
Bilirubin Urine: NEGATIVE
Hgb urine dipstick: NEGATIVE
Ketones, ur: NEGATIVE
Leukocytes,Ua: NEGATIVE
Nitrite: NEGATIVE
RBC / HPF: NONE SEEN (ref 0–?)
Specific Gravity, Urine: 1.02 (ref 1.000–1.030)
Total Protein, Urine: NEGATIVE
Urine Glucose: NEGATIVE
Urobilinogen, UA: 0.2 (ref 0.0–1.0)
pH: 6 (ref 5.0–8.0)

## 2019-02-25 LAB — UNLABELED: Test Ordered On Req: 395

## 2019-02-26 ENCOUNTER — Encounter: Payer: Self-pay | Admitting: Family Medicine

## 2019-02-28 LAB — URINE CULTURE
MICRO NUMBER:: 674041
SPECIMEN QUALITY:: ADEQUATE

## 2019-02-28 LAB — PAT ID TIQ DOC: Test Affected: 395

## 2019-03-16 ENCOUNTER — Other Ambulatory Visit: Payer: Self-pay

## 2019-03-16 ENCOUNTER — Telehealth: Payer: Self-pay | Admitting: Family Medicine

## 2019-03-16 DIAGNOSIS — I059 Rheumatic mitral valve disease, unspecified: Secondary | ICD-10-CM

## 2019-03-16 DIAGNOSIS — R700 Elevated erythrocyte sedimentation rate: Secondary | ICD-10-CM

## 2019-03-16 NOTE — Telephone Encounter (Signed)
Labs ordered and let patient know.

## 2019-03-16 NOTE — Telephone Encounter (Signed)
Patient would like orders for a lab draw in addition to the orders already placed for "any inflammation injection." Please contact patient to advise or to clarify. She comes 03/19/2019 for lab.

## 2019-03-16 NOTE — Telephone Encounter (Signed)
ESR, SED rate can be added.

## 2019-03-19 ENCOUNTER — Other Ambulatory Visit (INDEPENDENT_AMBULATORY_CARE_PROVIDER_SITE_OTHER): Payer: Medicare Other

## 2019-03-19 ENCOUNTER — Other Ambulatory Visit: Payer: Self-pay

## 2019-03-19 DIAGNOSIS — E7800 Pure hypercholesterolemia, unspecified: Secondary | ICD-10-CM | POA: Diagnosis not present

## 2019-03-19 DIAGNOSIS — E063 Autoimmune thyroiditis: Secondary | ICD-10-CM | POA: Diagnosis not present

## 2019-03-19 DIAGNOSIS — R350 Frequency of micturition: Secondary | ICD-10-CM

## 2019-03-19 DIAGNOSIS — E038 Other specified hypothyroidism: Secondary | ICD-10-CM | POA: Diagnosis not present

## 2019-03-19 DIAGNOSIS — R700 Elevated erythrocyte sedimentation rate: Secondary | ICD-10-CM

## 2019-03-19 LAB — COMPREHENSIVE METABOLIC PANEL
ALT: 24 U/L (ref 0–35)
AST: 25 U/L (ref 0–37)
Albumin: 4 g/dL (ref 3.5–5.2)
Alkaline Phosphatase: 60 U/L (ref 39–117)
BUN: 17 mg/dL (ref 6–23)
CO2: 31 mEq/L (ref 19–32)
Calcium: 10.2 mg/dL (ref 8.4–10.5)
Chloride: 96 mEq/L (ref 96–112)
Creatinine, Ser: 0.35 mg/dL — ABNORMAL LOW (ref 0.40–1.20)
GFR: 177.35 mL/min (ref >60.00)
Glucose, Bld: 99 mg/dL (ref 70–99)
Potassium: 4.2 mEq/L (ref 3.5–5.1)
Sodium: 133 mEq/L — ABNORMAL LOW (ref 135–145)
Total Bilirubin: 0.5 mg/dL (ref 0.2–1.2)
Total Protein: 7.3 g/dL (ref 6.0–8.3)

## 2019-03-19 LAB — CBC WITH DIFFERENTIAL/PLATELET
Basophils Absolute: 0 10*3/uL (ref 0.0–0.1)
Basophils Relative: 0.6 % (ref 0.0–3.0)
Eosinophils Absolute: 0.1 10*3/uL (ref 0.0–0.7)
Eosinophils Relative: 1.2 % (ref 0.0–5.0)
HCT: 40.7 % (ref 36.0–46.0)
Hemoglobin: 13.6 g/dL (ref 12.0–15.0)
Lymphocytes Relative: 42.3 % (ref 12.0–46.0)
Lymphs Abs: 3 10*3/uL (ref 0.7–4.0)
MCHC: 33.3 g/dL (ref 30.0–36.0)
MCV: 95.7 fl (ref 78.0–100.0)
Monocytes Absolute: 0.6 10*3/uL (ref 0.1–1.0)
Monocytes Relative: 9.2 % (ref 3.0–12.0)
Neutro Abs: 3.3 10*3/uL (ref 1.4–7.7)
Neutrophils Relative %: 46.7 % (ref 43.0–77.0)
Platelets: 255 10*3/uL (ref 150.0–400.0)
RBC: 4.25 Mil/uL (ref 3.87–5.11)
RDW: 13.2 % (ref 11.5–15.5)
WBC: 7 10*3/uL (ref 4.0–10.5)

## 2019-03-19 LAB — LIPID PANEL
Cholesterol: 188 mg/dL (ref 0–200)
HDL: 56.6 mg/dL (ref >39.00)
LDL Cholesterol: 105 mg/dL — ABNORMAL HIGH (ref 0–99)
NonHDL: 131.85
Total CHOL/HDL Ratio: 3
Triglycerides: 134 mg/dL (ref 0.0–149.0)
VLDL: 26.8 mg/dL (ref 0.0–40.0)

## 2019-03-19 LAB — SEDIMENTATION RATE: Sed Rate: 68 mm/hr — ABNORMAL HIGH (ref 0–30)

## 2019-03-19 LAB — TSH: TSH: 0.12 u[IU]/mL — ABNORMAL LOW (ref 0.35–4.50)

## 2019-03-19 LAB — T4, FREE: Free T4: 1.46 ng/dL (ref 0.60–1.60)

## 2019-03-20 ENCOUNTER — Telehealth: Payer: Self-pay | Admitting: Family Medicine

## 2019-03-20 ENCOUNTER — Other Ambulatory Visit: Payer: Self-pay

## 2019-03-20 MED ORDER — METOPROLOL TARTRATE 50 MG PO TABS: 50.0000 mg | ORAL_TABLET | Freq: Every day | ORAL | 1 refills | Status: DC

## 2019-03-20 NOTE — Telephone Encounter (Signed)
Refill sent in

## 2019-03-20 NOTE — Telephone Encounter (Signed)
[place], [place] [employer] [phone] (Phone) [phone] (Fax)   Pharmacist requesting metoprolol tartrate (LOPRESSOR) 50 MG tablet refill requesting on patient behalf, informed please allow 48 to 72 hour turn around time .

## 2019-03-20 NOTE — Telephone Encounter (Signed)
See note

## 2019-03-23 ENCOUNTER — Other Ambulatory Visit: Payer: Self-pay

## 2019-03-23 DIAGNOSIS — E0590 Thyrotoxicosis, unspecified without thyrotoxic crisis or storm: Secondary | ICD-10-CM

## 2019-03-23 MED ORDER — LEVOTHYROXINE SODIUM 112 MCG PO TABS: 112.0000 ug | ORAL_TABLET | Freq: Every day | ORAL | 3 refills | Status: DC

## 2019-03-23 NOTE — Progress Notes (Signed)
Made in error

## 2019-03-29 NOTE — Progress Notes (Signed)
Virtual Visit via Video   Due to the COVID-19 pandemic, this visit was completed with telemedicine (audio/video) technology to reduce patient and provider exposure as well as to preserve personal protective equipment.   I connected with [name] by a video enabled telemedicine application and verified that I am speaking with the correct person using two identifiers. Location patient: Home Location provider: [facility], Office Persons participating in the virtual visit: Darchelle Nunes, Briscoe Deutscher, DO   I discussed the limitations of evaluation and management by telemedicine and the availability of in person appointments. The patient expressed understanding and agreed to proceed.  Care Team   Patient Care Team: Briscoe Deutscher, DO as PCP - General (Family Medicine)  Subjective:   HPI: Patient had fall about one week ago. She did fall on knee. Has history of falls and pain. She has had increased pain and swelling after recent fall. She has been putting ice on it three times a day after fall and taking over the counter medications only for pain. She does not have any issues when siting. Only with walking. She does not have any redness she does have tenderness on on sides and swelling is more on the inside.   Review of Systems  Constitutional: Negative for chills and fever.  HENT: Negative for hearing loss and tinnitus.   Eyes: Negative for blurred vision and double vision.  Respiratory: Negative for cough and wheezing.   Cardiovascular: Negative for chest pain, palpitations and leg swelling.  Gastrointestinal: Negative for nausea and vomiting.  Genitourinary: Negative for dysuria and urgency.  Neurological: Negative for dizziness and headaches.  Psychiatric/Behavioral: Negative for depression and suicidal ideas.     Patient Active Problem List   Diagnosis Date Noted  . SSS (sick sinus syndrome) ([place]) 02/11/2019  . Constipation 01/18/2019  . Status post placement of  cardiac pacemaker 07/08/2018  . 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 Shellfish allergy 11/22/2015  . Nonrheumatic mitral valve regurgitation 09/16/2015  . Nontoxic multinodular goiter 09/08/2015  . Current use of long term anticoagulation 05/16/2015  . Frequency of urination 03/14/2015  . Recurrent UTI 03/14/2015  . (HFpEF) heart failure with preserved ejection fraction ([place]) 09/13/2014  . Pacemaker 11/09/2011  . Impaired fasting glucose 05/23/2009  . Cataract 11/22/2008  . Synovial cyst of popliteal space 11/22/2008  . Symptomatic menopausal or female climacteric states 07/19/2008  . Long term current use of systemic steroids 03/30/2008  . Polymyalgia rheumatica ([place]) 03/30/2008  . Myalgia 10/17/2007  . Benign essential hypertension 05/05/2007  . ESR raised 05/05/2007  . Mitral valve disorder 05/05/2007  . Chronic thyroiditis 11/06/2006  . Atrial fibrillation ([place]), Rx Digoxin and Pradaxa 11/06/2006  . Pure hypercholesterolemia 11/06/2006  . Hypothyroidism due to Hashimoto's thyroiditis, Rx Levothyroxine 06/25/2005    Social History   Tobacco Use  . Smoking status: Never Smoker  . Smokeless tobacco: Never Used  Substance Use Topics  . Alcohol use: No    Current Outpatient Medications:  .  dabigatran (PRADAXA) 150 MG CAPS capsule, Take 1 capsule (150 mg total) by mouth every 12 (twelve) hours., Disp: 180 capsule, Rfl: 3 .  digoxin (LANOXIN) 0.25 MG tablet, Take 1 tablet (0.25 mg total) by mouth daily., Disp: 90 tablet, Rfl: 1 .  hydrochlorothiazide (HYDRODIURIL) 25 MG tablet, Take 1 tablet (25 mg total) by mouth daily. (Patient taking differently: Take 12.5 mg by mouth daily. ), Disp: 90 tablet, Rfl: 1 .  levothyroxine (SYNTHROID) 112 MCG tablet, Take 1 tablet (112 mcg  total) by mouth daily., Disp: 90 tablet, Rfl: 3 .  Metoprolol Succinate 50 MG CS24, Take 50 mg by mouth daily., Disp: 90 capsule, Rfl: 2 .  predniSONE (DELTASONE) 1 MG tablet, Take 1 tablet (1 mg total) by  mouth daily., Disp: 90 tablet, Rfl: 1  Allergies  Allergen Reactions  . Monosodium Glutamate Other (See Comments)    other  . Epinephrine   . Erythromycin   . Shellfish Allergy Other (See Comments)  . Sulfamethoxazole-Trimethoprim   . Tetracyclines & Related     Objective:   VITALS: Per patient if applicable, see vitals. GENERAL: Alert, appears well and in no acute distress. HEENT: Atraumatic, conjunctiva clear, no obvious abnormalities on inspection of external nose and ears. NECK: Normal movements of the head and neck. CARDIOPULMONARY: No increased WOB. Speaking in clear sentences. I:E ratio WNL.  MS: Moves all visible extremities without noticeable abnormality. PSYCH: Pleasant and cooperative, well-groomed. Speech normal rate and rhythm. Affect is appropriate. Insight and judgement are appropriate. Attention is focused, linear, and appropriate.  NEURO: CN grossly intact. Oriented as arrived to appointment on time with no prompting. Moves both UE equally.  SKIN: No obvious lesions, wounds, erythema, or cyanosis noted on face or hands.  Depression screen [facility] 2/9 01/12/2019  Decreased Interest 0  Down, Depressed, Hopeless 0  PHQ - 2 Score 0  Altered sleeping 3  Tired, decreased energy 0  Change in appetite 0  Feeling bad or failure about yourself  0  Trouble concentrating 0  Moving slowly or fidgety/restless 0  Suicidal thoughts 0  PHQ-9 Score 3  Difficult doing work/chores Not difficult at all    Assessment and Plan:   Naysha was seen today for knee pain.  Diagnoses and all orders for this visit:  Chronic pain of right knee Comments: With acute injury.  Patient says that her physical therapist recommended go to the emergency department based on the way her knee looked.  Refer to Ortho. Orders: -     Ambulatory referral to Orthopedic Surgery  Urinary incontinence, unspecified type -     Ambulatory referral to Urology  Recurrent UTI  Chronic heart failure with  preserved ejection fraction (HCC) -     Metoprolol Succinate 50 MG CS24; Take 50 mg by mouth daily.    Marland Kitchen COVID-19 Education: The signs and symptoms of COVID-19 were discussed with the patient and how to seek care for testing if needed. The importance of social distancing was discussed today. . Reviewed expectations re: course of current medical issues. . Discussed self-management of symptoms. . Outlined signs and symptoms indicating need for more acute intervention. . Patient verbalized understanding and all questions were answered. Marland Kitchen Health Maintenance issues including appropriate healthy diet, exercise, and smoking avoidance were discussed with patient. . See orders for this visit as documented in the electronic medical record.  Briscoe Deutscher, DO  Records requested if needed. Time spent: 25 minutes, of which >50% was spent in obtaining information about her symptoms, reviewing her previous labs, evaluations, and treatments, counseling her about her condition (please see the discussed topics above), and developing a plan to further investigate it; she had a number of questions which I addressed.

## 2019-03-30 ENCOUNTER — Ambulatory Visit (INDEPENDENT_AMBULATORY_CARE_PROVIDER_SITE_OTHER): Payer: Medicare Other | Admitting: Family Medicine

## 2019-03-30 ENCOUNTER — Encounter: Payer: Self-pay | Admitting: Family Medicine

## 2019-03-30 VITALS — Ht 63.0 in | Wt 132.0 lb

## 2019-03-30 DIAGNOSIS — N390 Urinary tract infection, site not specified: Secondary | ICD-10-CM | POA: Diagnosis not present

## 2019-03-30 DIAGNOSIS — M25561 Pain in right knee: Secondary | ICD-10-CM | POA: Diagnosis not present

## 2019-03-30 DIAGNOSIS — I5032 Chronic diastolic (congestive) heart failure: Secondary | ICD-10-CM | POA: Diagnosis not present

## 2019-03-30 DIAGNOSIS — R32 Unspecified urinary incontinence: Secondary | ICD-10-CM | POA: Diagnosis not present

## 2019-03-30 DIAGNOSIS — G8929 Other chronic pain: Secondary | ICD-10-CM

## 2019-03-30 MED ORDER — METOPROLOL SUCCINATE 50 MG PO CS24: 50.0000 mg | EXTENDED_RELEASE_CAPSULE | Freq: Every day | ORAL | 2 refills | Status: DC

## 2019-04-02 ENCOUNTER — Ambulatory Visit (INDEPENDENT_AMBULATORY_CARE_PROVIDER_SITE_OTHER): Payer: Medicare Other | Admitting: Physician Assistant

## 2019-04-02 ENCOUNTER — Encounter: Payer: Self-pay | Admitting: Physician Assistant

## 2019-04-02 ENCOUNTER — Ambulatory Visit (INDEPENDENT_AMBULATORY_CARE_PROVIDER_SITE_OTHER): Payer: Medicare Other

## 2019-04-02 DIAGNOSIS — M1711 Unilateral primary osteoarthritis, right knee: Secondary | ICD-10-CM

## 2019-04-02 NOTE — Progress Notes (Signed)
Office Visit Note   Patient: Deanna Rivera           Date of Birth: 01/20/1933           MRN: [id number] Visit Date: 04/02/2019               Requested by: Briscoe Deutscher, [address],  [place] [address] PCP: Briscoe Deutscher, DO   Assessment & Plan: Visit Diagnoses:  1. Primary osteoarthritis of right knee     Plan: Long discussion with patient and her husband was had today discussing treatment options.  She is not an ideal surgical candidate is been told to avoid any type of surgical procedure on her knee.  Discussed with her conservative treatment which include aspiration and cortisone injection of the right knee, bracing, strengthening, natural anti-inflammatories, topical anti-inflammatories and supplemental injections.  Handout on supplemental injections was given.  She decided to go with some strengthening of the knee.  She already is seeing the therapist to work on gait I gave her prescription for quad strengthening home exercise program.  I showed her some quad strengthening exercises she can do on her own.  She is given a knee brace today.  She will follow-up with [place] on an as-needed basis pain persist or becomes worse.  Questions encouraged and answered at length.  Follow-Up Instructions: Return if symptoms worsen or fail to improve.   Orders:  Orders Placed This Encounter  Procedures  . XR Knee 1-2 Views Right   No orders of the defined types were placed in this encounter.     Procedures: No procedures performed   Clinical Data: No additional findings.   Subjective: Chief Complaint  Patient presents with  . Right Knee - Pain    HPI Deanna Rivera is an 85 year old female who were seen for the first time today for right knee pain.  She states that her right knee gives way on her at times and causes her to fall she is had multiple falls over the past 10 years due to her knee pain and the knee giving way on her.  She initially is wanting no plain  films over of her knee.  She is been told that she has a Baker's cyst in the knee.  She wants to know what type of exercise or therapy she can do to make her knee better.  She is been taken Tylenol for pain.  She states that the Tylenol does help sleep.  She notes swelling of the knee.  She ambulates with a Rollator. Review of Systems   Objective: Vital Signs: There were no vitals taken for this visit.  Physical Exam Constitutional:      Appearance: She is not ill-appearing or diaphoretic.  Pulmonary:     Effort: Pulmonary effort is normal.  Neurological:     Mental Status: She is alert and oriented to person, place, and time.  Psychiatric:        Mood and Affect: Mood normal.     Ortho Exam Bilateral knees good range of motion.  Right knee with slight crepitus passive range of motion.  She has tenderness along medial joint line of both knees.  No instability valgus varus stressing of either knee no abnormal warmth erythema or ecchymosis of either knee.  Slight effusion of the right knee no effusion left knee. Specialty Comments:  No specialty comments available.  Imaging: Xr Knee 1-2 Views Right  Result Date: 04/02/2019 Right knee AP and lateral views:  No acute fractures.  Near bone-on-bone medial compartment.  Slight narrowing of the lateral joint line.  Mild to moderate patellofemoral changes.  Normal bone mineralization.    PMFS History: Patient Active Problem List   Diagnosis Date Noted  . SSS (sick sinus syndrome) ([place]) 02/11/2019  . Constipation 01/18/2019  . Status post placement of cardiac pacemaker 07/08/2018  . 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 Shellfish allergy 11/22/2015  . Nonrheumatic mitral valve regurgitation 09/16/2015  . Nontoxic multinodular goiter 09/08/2015  . Current use of long term anticoagulation 05/16/2015  . Frequency of urination 03/14/2015  . Recurrent UTI 03/14/2015  . (HFpEF) heart failure with preserved ejection fraction ([place])  09/13/2014  . Pacemaker 11/09/2011  . Impaired fasting glucose 05/23/2009  . Cataract 11/22/2008  . Synovial cyst of popliteal space 11/22/2008  . Symptomatic menopausal or female climacteric states 07/19/2008  . Long term current use of systemic steroids 03/30/2008  . Polymyalgia rheumatica ([place]) 03/30/2008  . Myalgia 10/17/2007  . Benign essential hypertension 05/05/2007  . ESR raised 05/05/2007  . Mitral valve disorder 05/05/2007  . Chronic thyroiditis 11/06/2006  . Atrial fibrillation ([place]), Rx Digoxin and Pradaxa 11/06/2006  . Pure hypercholesterolemia 11/06/2006  . Hypothyroidism due to Hashimoto's thyroiditis, Rx Levothyroxine 06/25/2005   Past Medical History:  Diagnosis Date  . Atrial fibrillation ([place])   . Hypertension     Family History  Problem Relation Age of Onset  . Early death Mother 3  . Hypertension Brother   . Cirrhosis Father 62  . Lung cancer Brother 47  . Scleroderma Daughter     Past Surgical History:  Procedure Laterality Date  . ABDOMINAL HYSTERECTOMY    . CESAREAN SECTION    . CHOLECYSTECTOMY    . PILONIDAL CYST EXCISION     Social History   Occupational History  . Not on file  Tobacco Use  . Smoking status: Never Smoker  . Smokeless tobacco: Never Used  Substance and Sexual Activity  . Alcohol use: No  . Drug use: No  . Sexual activity: Not on file

## 2019-04-03 ENCOUNTER — Encounter: Payer: Self-pay | Admitting: Family Medicine

## 2019-04-07 ENCOUNTER — Telehealth: Payer: Self-pay | Admitting: Family Medicine

## 2019-04-07 NOTE — Telephone Encounter (Signed)
Called l/m to call office. She had labs and med change on 8/6. She is due for recheck anytime after 9/14. Orders are in and app can be made. Will not show change in labs if she has done this soon.

## 2019-04-07 NOTE — Telephone Encounter (Signed)
See note

## 2019-04-07 NOTE — Telephone Encounter (Signed)
Relation to pt: self  Call back [phone]    Reason for call:   Patient states since increase of levothyroxine (SYNTHROID) 112 MCG tablet, patient has gain a lot of weight and would like thyroid recheck, requesting orders, please advise

## 2019-04-09 ENCOUNTER — Telehealth: Payer: Self-pay | Admitting: Internal Medicine

## 2019-04-09 NOTE — Telephone Encounter (Signed)
Patient states the last time she was in to see Dr. Rayann Heman he was going to send her for an Echo, I did not see an order for it.

## 2019-04-09 NOTE — Addendum Note (Signed)
Addended by: Willeen Cass A on: 04/09/2019 09:04 AM   Modules accepted: Orders

## 2019-04-13 NOTE — Telephone Encounter (Signed)
Called patient gave information and lab app made. Will call if any issues.

## 2019-04-16 ENCOUNTER — Ambulatory Visit (HOSPITAL_COMMUNITY): Payer: Medicare Other | Attending: Internal Medicine

## 2019-04-16 ENCOUNTER — Other Ambulatory Visit: Payer: Self-pay

## 2019-04-16 DIAGNOSIS — I340 Nonrheumatic mitral (valve) insufficiency: Secondary | ICD-10-CM | POA: Insufficient documentation

## 2019-04-21 ENCOUNTER — Telehealth: Payer: Self-pay | Admitting: Internal Medicine

## 2019-04-21 NOTE — Telephone Encounter (Signed)
Patient would like to talk more to the nurse about the ECHO.

## 2019-04-27 ENCOUNTER — Other Ambulatory Visit: Payer: Self-pay

## 2019-04-28 ENCOUNTER — Other Ambulatory Visit (INDEPENDENT_AMBULATORY_CARE_PROVIDER_SITE_OTHER): Payer: Medicare Other

## 2019-04-28 ENCOUNTER — Other Ambulatory Visit: Payer: Self-pay

## 2019-04-28 DIAGNOSIS — E559 Vitamin D deficiency, unspecified: Secondary | ICD-10-CM

## 2019-04-28 DIAGNOSIS — R700 Elevated erythrocyte sedimentation rate: Secondary | ICD-10-CM | POA: Diagnosis not present

## 2019-04-28 DIAGNOSIS — E0590 Thyrotoxicosis, unspecified without thyrotoxic crisis or storm: Secondary | ICD-10-CM

## 2019-04-28 LAB — TSH: TSH: 0.61 u[IU]/mL (ref 0.35–4.50)

## 2019-04-28 LAB — SEDIMENTATION RATE: Sed Rate: 45 mm/hr — ABNORMAL HIGH (ref 0–30)

## 2019-04-28 LAB — VITAMIN D 25 HYDROXY (VIT D DEFICIENCY, FRACTURES): VITD: 29.79 ng/mL — ABNORMAL LOW (ref 30.00–100.00)

## 2019-04-28 NOTE — Addendum Note (Signed)
Addended by: Francis Dowse T on: 04/28/2019 01:11 PM   Modules accepted: Orders

## 2019-04-30 ENCOUNTER — Other Ambulatory Visit: Payer: PRIVATE HEALTH INSURANCE

## 2019-05-13 ENCOUNTER — Ambulatory Visit (INDEPENDENT_AMBULATORY_CARE_PROVIDER_SITE_OTHER): Payer: Medicare Other | Admitting: *Deleted

## 2019-05-13 DIAGNOSIS — I495 Sick sinus syndrome: Secondary | ICD-10-CM

## 2019-05-17 LAB — CUP PACEART REMOTE DEVICE CHECK
Battery Remaining Longevity: 114 mo
Battery Remaining Percentage: 100 %
Brady Statistic RA Percent Paced: 0 %
Brady Statistic RV Percent Paced: 86 %
Date Time Interrogation Session: 20201002133700
Implantable Lead Implant Date: 20130305
Implantable Lead Implant Date: 20130305
Implantable Lead Location: 753859
Implantable Lead Location: 753860
Implantable Lead Model: 4469
Implantable Lead Model: 4470
Implantable Lead Serial Number: 546229
Implantable Lead Serial Number: 712016
Implantable Pulse Generator Implant Date: 20200409
Lead Channel Impedance Value: 458 Ohm
Lead Channel Impedance Value: 569 Ohm
Lead Channel Pacing Threshold Amplitude: 0.5 V
Lead Channel Pacing Threshold Pulse Width: 0.4 ms
Lead Channel Setting Pacing Amplitude: 1 V
Lead Channel Setting Pacing Amplitude: 2.5 V
Lead Channel Setting Pacing Pulse Width: 0.4 ms
Lead Channel Setting Sensing Sensitivity: 0.6 mV
Pulse Gen Serial Number: 859007

## 2019-05-19 ENCOUNTER — Encounter: Payer: Self-pay | Admitting: Cardiology

## 2019-05-19 NOTE — Progress Notes (Signed)
Remote pacemaker transmission.   

## 2019-06-08 ENCOUNTER — Ambulatory Visit (INDEPENDENT_AMBULATORY_CARE_PROVIDER_SITE_OTHER): Payer: Medicare Other | Admitting: Family Medicine

## 2019-06-08 ENCOUNTER — Encounter: Payer: Self-pay | Admitting: Family Medicine

## 2019-06-08 VITALS — Temp 97.5°F | Wt 132.0 lb

## 2019-06-08 DIAGNOSIS — I4811 Longstanding persistent atrial fibrillation: Secondary | ICD-10-CM

## 2019-06-08 DIAGNOSIS — E038 Other specified hypothyroidism: Secondary | ICD-10-CM

## 2019-06-08 DIAGNOSIS — I5032 Chronic diastolic (congestive) heart failure: Secondary | ICD-10-CM | POA: Diagnosis not present

## 2019-06-08 DIAGNOSIS — Z7901 Long term (current) use of anticoagulants: Secondary | ICD-10-CM

## 2019-06-08 DIAGNOSIS — I10 Essential (primary) hypertension: Secondary | ICD-10-CM

## 2019-06-08 DIAGNOSIS — Z7952 Long term (current) use of systemic steroids: Secondary | ICD-10-CM

## 2019-06-08 DIAGNOSIS — G7241 Inclusion body myositis [IBM]: Secondary | ICD-10-CM

## 2019-06-08 DIAGNOSIS — M353 Polymyalgia rheumatica: Secondary | ICD-10-CM

## 2019-06-08 DIAGNOSIS — E063 Autoimmune thyroiditis: Secondary | ICD-10-CM

## 2019-06-08 DIAGNOSIS — I340 Nonrheumatic mitral (valve) insufficiency: Secondary | ICD-10-CM

## 2019-06-08 DIAGNOSIS — N3281 Overactive bladder: Secondary | ICD-10-CM

## 2019-06-08 MED ORDER — HYDROCHLOROTHIAZIDE 25 MG PO TABS: 12.5000 mg | ORAL_TABLET | Freq: Every day | ORAL | Status: DC

## 2019-06-08 MED ORDER — OXYBUTYNIN CHLORIDE ER 10 MG PO TB24: 10.0000 mg | ORAL_TABLET | Freq: Every day | ORAL | 5 refills | Status: DC

## 2019-06-08 NOTE — Progress Notes (Signed)
Virtual Visit via Video Note  Subjective  CC:  Chief Complaint  Patient presents with  . Transitions Of Care    Several referrals requested  . Urinary Frequency    Has had a recall on the type of wipes she has and reports she has increased urination and urine odor  . Insomnia     I connected with [name] on 06/08/19 at  3:20 PM EDT by a video enabled telemedicine application and verified that I am speaking with the correct person using two identifiers. Location patient: Home Location provider: [facility] [place], Office Persons participating in the virtual visit: Shelda Matsunaga, [name], MD Lilli Light, [place] discussed the limitations of evaluation and management by telemedicine and the availability of in person appointments. The patient expressed understanding and agreed to proceed. HPI: Female Deanna Rivera is a 85 y.o. female who was contacted today to address the problems listed above in the chief complaint. . 30 married female with multple medical problems documented and reviewed below. Reviewed chart and most recent labs.  . New problem: oab sx with urgency and frequent urination w/o dysuria or incontinence or gross hematuria. On hctz low dose for chf/htn. . 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 Steroids have been adjusted. Now only on 1mg  daily. Says aches and pains have improved. By report, likely had adrenal insufficiency after steroids had been stopped last year. Has IFG due to steroid use.  Marland Kitchen Poor sleeper. Chronically.  . recnet prevnar vaccine given at assited living facility.  . Sees cards due to severe MR and SSS s/p pacer. Also with afib on meds and anticoagulated.  Marland Kitchen HIGH risk pt.  . Declines further bone density screens.  Assessment  1. OAB (overactive bladder)   2. Chronic heart failure with preserved ejection fraction ([place])    3. Longstanding persistent atrial fibrillation ([place])   4. Long term current use of systemic steroids   5. Nonrheumatic mitral valve regurgitation   6. Benign essential hypertension   7. Current use of long term anticoagulation   8. Hypothyroidism due to Hashimoto's thyroiditis, Rx Levothyroxine   9. Inclusion body myositis   10. Polymyalgia rheumatica (HCC)      Plan   Chronic problems seem well controlled. :  No med changes today  Refer to rheum to manage long term pred  Trial of ditropan for oab sxs. Discussed irritative sxs: schedule office visit if needed to check urine.   Cards managing heart issues. HR pt; high fall risk as well  Low thyroid recently checked and controlled.   Due awv.  I discussed the assessment and treatment plan with the patient. The patient was provided an opportunity to ask questions and all were answered. The patient agreed with the plan and demonstrated an understanding of the instructions.   The patient was advised to call back or seek an in-person evaluation if the symptoms worsen or if the condition fails to improve as anticipated. Follow up: 3 months for recheck. awv needs to be scheduled as well.  Visit date not found  Meds ordered this encounter  Medications  . hydrochlorothiazide (HYDRODIURIL) 25 MG tablet    Sig: Take 0.5 tablets (12.5 mg total) by mouth daily.  Marland Kitchen oxybutynin (DITROPAN XL) 10 MG 24 hr tablet    Sig: Take 1 tablet (10 mg total) by mouth at bedtime.  Dispense:  30 tablet    Refill:  5      I reviewed the patients updated PMH, FH, and SocHx.    Patient Active Problem List   Diagnosis Date Noted  . SSS (sick sinus syndrome) ([place]) 02/11/2019    Priority: High  . Status post placement of cardiac pacemaker 07/08/2018    Priority: High  . 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 Nontoxic multinodular goiter 09/08/2015    Priority: High  . Current use of long term anticoagulation 05/16/2015    Priority:  High  . (HFpEF) heart failure with preserved ejection fraction ([place]) 09/13/2014    Priority: High  . Impaired fasting glucose 05/23/2009    Priority: High  . Long term current use of systemic steroids 03/30/2008    Priority: High  . Polymyalgia rheumatica ([place]) 03/30/2008    Priority: High  . Benign essential hypertension 05/05/2007    Priority: High  . Atrial fibrillation ([place]), Rx Digoxin and Pradaxa 11/06/2006    Priority: High  . Pure hypercholesterolemia 11/06/2006    Priority: High  . Hypothyroidism due to Hashimoto's thyroiditis, Rx Levothyroxine 06/25/2005    Priority: High  . Nonrheumatic mitral valve regurgitation 09/16/2015    Priority: Medium  . Recurrent UTI 03/14/2015    Priority: Medium  . Constipation 01/18/2019    Priority: Low  . Shellfish allergy 11/22/2015  . Synovial cyst of popliteal space 11/22/2008   Current Meds  Medication Sig  . dabigatran (PRADAXA) 150 MG CAPS capsule Take 1 capsule (150 mg total) by mouth every 12 (twelve) hours.  . digoxin (LANOXIN) 0.25 MG tablet Take 1 tablet (0.25 mg total) by mouth daily.  . hydrochlorothiazide (HYDRODIURIL) 25 MG tablet Take 0.5 tablets (12.5 mg total) by mouth daily.  Marland Kitchen levothyroxine (SYNTHROID) 112 MCG tablet Take 1 tablet (112 mcg total) by mouth daily.  . Metoprolol Succinate 50 MG CS24 Take 50 mg by mouth daily.  . predniSONE (DELTASONE) 1 MG tablet Take 1 tablet (1 mg total) by mouth daily.  . [DISCONTINUED] hydrochlorothiazide (HYDRODIURIL) 25 MG tablet Take 1 tablet (25 mg total) by mouth daily. (Patient taking differently: Take 12.5 mg by mouth daily. )    Allergies: Patient is allergic to monosodium glutamate; epinephrine; erythromycin; shellfish allergy; sulfamethoxazole-trimethoprim; and tetracyclines & related. Family History: Patient family history includes Cirrhosis (age of onset: 58) in her father; Early death (age of onset: 71) in her mother; Hypertension in her brother; Lung cancer (age of  onset: 50) in her brother; Scleroderma in her daughter. Social History:  Patient  reports that she has never smoked. She has never used smokeless tobacco. She reports that she does not drink alcohol or use drugs.  Review of Systems: Constitutional: Negative for fever malaise or anorexia Cardiovascular: negative for chest pain Respiratory: negative for SOB or persistent cough Gastrointestinal: negative for abdominal pain  OBJECTIVE Vitals: Temp (!) 97.5 F (36.4 C) (Oral)   Wt 132 lb (59.9 kg)   BMI 23.38 kg/m  General: no acute distress , A&Ox3  [name], MD

## 2019-06-10 ENCOUNTER — Encounter: Payer: Self-pay | Admitting: *Deleted

## 2019-06-19 ENCOUNTER — Telehealth: Payer: Self-pay

## 2019-06-19 NOTE — Telephone Encounter (Signed)
Copied from [place] [id number]. Topic: Referral - Request for Referral >> Jun 19, 2019 10:34 AM Erick Blinks wrote: Has patient seen PCP for this complaint? Yes.   *If NO, is insurance requiring patient see PCP for this issue before PCP can refer them? Referral for which specialty: Urology  Preferred provider/office: Prefers a female physician (highest recommended) Reason for referral: Irregular urination

## 2019-06-19 NOTE — Telephone Encounter (Signed)
Called patient she will call back later to make app. She did not have calender with her at this time.

## 2019-06-19 NOTE — Telephone Encounter (Signed)
Called patient she will call back to office to make app when she has callander in front of her.

## 2019-06-19 NOTE — Telephone Encounter (Signed)
Copied from [place] [id number]. Topic: General - Call Back - No Documentation >> Jun 19, 2019 10:40 AM Erick Blinks wrote: Reason for CRM: Requesting call back from nurse regarding possible UTI [phone]

## 2019-06-19 NOTE — Telephone Encounter (Signed)
Ok to start referral?  ?

## 2019-06-19 NOTE — Telephone Encounter (Signed)
Needs ov in person with me first to check urine, and examine pt.

## 2019-06-25 ENCOUNTER — Ambulatory Visit (INDEPENDENT_AMBULATORY_CARE_PROVIDER_SITE_OTHER): Payer: Medicare Other | Admitting: Physician Assistant

## 2019-06-25 ENCOUNTER — Encounter: Payer: Self-pay | Admitting: Physician Assistant

## 2019-06-25 ENCOUNTER — Other Ambulatory Visit: Payer: Self-pay

## 2019-06-25 DIAGNOSIS — M1711 Unilateral primary osteoarthritis, right knee: Secondary | ICD-10-CM

## 2019-06-25 MED ORDER — LIDOCAINE HCL 1 % IJ SOLN
3.0000 mL | INTRAMUSCULAR | Status: AC | PRN
  Administered 2019-06-25: 14:00:00 3 mL

## 2019-06-25 MED ORDER — METHYLPREDNISOLONE ACETATE 40 MG/ML IJ SUSP
40.0000 mg | INTRAMUSCULAR | Status: AC | PRN
  Administered 2019-06-25: 14:00:00 40 mg via INTRA_ARTICULAR

## 2019-06-25 NOTE — Progress Notes (Addendum)
   Procedure Note  Patient: Deanna Rivera             Date of Birth: [birth year]           MRN: [id number]             Visit Date: 06/25/2019  HPI: Deanna Rivera returns today due to her right knee pain.  Again she has severe arthritis of her right knee.  She is not a surgical candidate.  She is back today asking about possible injection aspiration of the knee.  At last visit she did not want to undergo aspiration of the knee or an injection.  She has had no new injury to the knee.  She ambulates with a Rollator.  Physical exam right knee tenderness along medial joint line.  No abnormal warmth erythema positive effusion.  Good range of motion both knees otherwise.  Procedures: Visit Diagnoses:  1. Primary osteoarthritis of right knee     Large Joint Inj on 06/25/2019 2:05 PM Indications: pain Details: 22 G 1.5 in needle, anterolateral approach  Arthrogram: No  Medications: 3 mL lidocaine 1 %; 40 mg methylPREDNISolone acetate 40 MG/ML Aspirate: 20 mL yellow Outcome: tolerated well, no immediate complications Procedure, treatment alternatives, risks and benefits explained, specific risks discussed. Consent was given by the patient. Immediately prior to procedure a time out was called to verify the correct patient, procedure, equipment, support staff and site/side marked as required. Patient was prepped and draped in the usual sterile fashion.     Plan: Reviewed quad strengthening exercises with her.  Discussed that she could have injections in the knee no more often than every 3 months.  Questions were encouraged and answered.

## 2019-06-26 ENCOUNTER — Telehealth: Payer: Self-pay

## 2019-06-26 NOTE — Telephone Encounter (Signed)
Spoke with patient.  She is c/o of a "thick" red mark approx 4" in length under right breast.  She reports that it is itchy, but denies any pain or burning.  She states that it has not spread anywhere else and has no rash anywhere else on her body.  She has had shingles in the past and is questioning if this can be recurrent.    She has not tried any hydrocortisone cream or anything else.    Please advise.

## 2019-06-26 NOTE — Telephone Encounter (Signed)
Patient is scheduled for an appt on 07/21/19 and has been added to the wait list in case someone cancels. If a CMA or [name] will call pt back in regards to the spot under her breast. Thank you!

## 2019-06-26 NOTE — Telephone Encounter (Signed)
Copied from [place] [id number]. Topic: General - Other >> Jun 26, 2019  9:46 AM Deanna Rivera wrote: Reason for CRM: Pt requests an appt with [name] asap on a Tuesday or Thursday for a referral to a urologist. Pt also request a call back for advice regarding a spot under her breast.

## 2019-06-26 NOTE — Telephone Encounter (Signed)
Please schedule for visit next week for urine and rash. Can use same day if needed. Thanks.

## 2019-06-26 NOTE — Telephone Encounter (Signed)
Hi Deanna Rivera,   Can you pls call this patient and schedule visit per Dr. Tamela Oddi note below.  Thanks

## 2019-06-29 ENCOUNTER — Telehealth: Payer: Self-pay | Admitting: Physician Assistant

## 2019-06-29 NOTE — Telephone Encounter (Signed)
Please advise 

## 2019-06-29 NOTE — Telephone Encounter (Signed)
Advised patient

## 2019-06-29 NOTE — Telephone Encounter (Signed)
Would give the injection more time to work.  She may benefit from a supplemental injection in the future.

## 2019-06-29 NOTE — Telephone Encounter (Signed)
Patient called left voicemail message stating that her leg is worse,stiff and need to know what should she do now? Patient asked for a call back today. The number to contact patient is [phone]

## 2019-07-02 ENCOUNTER — Ambulatory Visit (INDEPENDENT_AMBULATORY_CARE_PROVIDER_SITE_OTHER): Payer: Medicare Other | Admitting: Family Medicine

## 2019-07-02 ENCOUNTER — Encounter: Payer: Self-pay | Admitting: Family Medicine

## 2019-07-02 ENCOUNTER — Other Ambulatory Visit: Payer: Self-pay

## 2019-07-02 VITALS — BP 128/84 | HR 65 | Temp 97.4°F | Ht 63.0 in | Wt 130.0 lb

## 2019-07-02 DIAGNOSIS — I5032 Chronic diastolic (congestive) heart failure: Secondary | ICD-10-CM

## 2019-07-02 DIAGNOSIS — M353 Polymyalgia rheumatica: Secondary | ICD-10-CM

## 2019-07-02 DIAGNOSIS — N3281 Overactive bladder: Secondary | ICD-10-CM

## 2019-07-02 DIAGNOSIS — B0229 Other postherpetic nervous system involvement: Secondary | ICD-10-CM

## 2019-07-02 DIAGNOSIS — R350 Frequency of micturition: Secondary | ICD-10-CM | POA: Diagnosis not present

## 2019-07-02 LAB — POCT URINALYSIS DIPSTICK
Bilirubin, UA: POSITIVE
Blood, UA: NEGATIVE
Glucose, UA: NEGATIVE
Ketones, UA: NEGATIVE
Leukocytes, UA: NEGATIVE
Nitrite, UA: NEGATIVE
Protein, UA: NEGATIVE
Spec Grav, UA: >=1.03 — AB (ref 1.010–1.025)
Urobilinogen, UA: 0.2 E.U./dL
pH, UA: 6 (ref 5.0–8.0)

## 2019-07-02 NOTE — Progress Notes (Signed)
Subjective  CC:  Chief Complaint  Patient presents with  . Rash  . Over Active Bladder    HPI: Deanna Rivera is a 85 y.o. female who presents to the office today to address the problems listed above in the chief complaint.  85 yo with sxs of OAB. We discussed at last visit. Has frequent urgency sxs w/o dysuria; nocturia w/o hematuria. No pelvic pain, f/c/s. Was to start ditropan but never did. Worried that it may decrease her urine output and she has CHF. She denies sob or LE edema. The urinary frequency causes her to get up multiple times / night and disrupts her sleep. She has an unsteady gait and ambulates with walker due to PMR and is a high fall risk.   Beneath left breast was red and itchy; she has postherpetic neuralgia in that dermatome. No pain. She thought she had a rash.    Assessment  1. OAB (overactive bladder)   2. Urinary frequency   3. Neuralgia, postherpetic   4. Chronic heart failure with preserved ejection fraction (HCC)   5. Polymyalgia rheumatica ([place])      Plan   OAB:  Educated. Start trial of oxybutinin. F/u if not improving. ua today is concentrated w/o signs of infection.  Neuralgia: reassured. No rash.   PMR with unsteady gait on oral anticoagulation for afib. Discussed fall prevention.   Chf, diastolic: currently stable and managed with low dose hctz   Follow up: Return in about 3 months (around 10/02/2019) for recheck.  Visit date not found  Orders Placed This Encounter  Procedures  . Urine culture  . POCT urinalysis dipstick   No orders of the defined types were placed in this encounter.     I reviewed the patients updated PMH, FH, and SocHx.    Patient Active Problem List   Diagnosis Date Noted  . SSS (sick sinus syndrome) ([place]) 02/11/2019    Priority: High  . Status post placement of cardiac pacemaker 07/08/2018    Priority: High  . 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 Nontoxic multinodular goiter 09/08/2015     Priority: High  . Current use of long term anticoagulation 05/16/2015    Priority: High  . (HFpEF) heart failure with preserved ejection fraction ([place]) 09/13/2014    Priority: High  . Impaired fasting glucose 05/23/2009    Priority: High  . Long term current use of systemic steroids 03/30/2008    Priority: High  . Polymyalgia rheumatica ([place]) 03/30/2008    Priority: High  . Benign essential hypertension 05/05/2007    Priority: High  . Atrial fibrillation ([place]), Rx Digoxin and Pradaxa 11/06/2006    Priority: High  . Pure hypercholesterolemia 11/06/2006    Priority: High  . Hypothyroidism due to Hashimoto's thyroiditis, Rx Levothyroxine 06/25/2005    Priority: High  . Nonrheumatic mitral valve regurgitation 09/16/2015    Priority: Medium  . Recurrent UTI 03/14/2015    Priority: Medium  . Constipation 01/18/2019    Priority: Low  . Neuralgia, postherpetic 07/02/2019  . OAB (overactive bladder) 07/02/2019  . Shellfish allergy 11/22/2015  . Synovial cyst of popliteal space 11/22/2008   Current Meds  Medication Sig  . dabigatran (PRADAXA) 150 MG CAPS capsule Take 1 capsule (150 mg total) by mouth every 12 (twelve) hours.  . digoxin (LANOXIN) 0.25 MG tablet Take 1 tablet (0.25 mg total) by mouth daily.  . hydrochlorothiazide (HYDRODIURIL) 25 MG tablet Take 0.5 tablets (12.5 mg total) by  mouth daily.  Marland Kitchen levothyroxine (SYNTHROID) 112 MCG tablet Take 1 tablet (112 mcg total) by mouth daily.  . Metoprolol Succinate 50 MG CS24 Take 50 mg by mouth daily.  Marland Kitchen oxybutynin (DITROPAN XL) 10 MG 24 hr tablet Take 1 tablet (10 mg total) by mouth at bedtime.  . predniSONE (DELTASONE) 1 MG tablet Take 1 tablet (1 mg total) by mouth daily.    Allergies: Patient is allergic to monosodium glutamate; epinephrine; erythromycin; shellfish allergy; sulfamethoxazole-trimethoprim; and tetracyclines & related. Family History: Patient family history includes Cirrhosis (age of onset: 15) in her father;  Early death (age of onset: 58) in her mother; Hypertension in her brother; Lung cancer (age of onset: 60) in her brother; Scleroderma in her daughter. Social History:  Patient  reports that she has never smoked. She has never used smokeless tobacco. She reports that she does not drink alcohol or use drugs.  Review of Systems: Constitutional: Negative for fever malaise or anorexia Cardiovascular: negative for chest pain Respiratory: negative for SOB or persistent cough Gastrointestinal: negative for abdominal pain  Objective  Vitals: BP 128/84 (BP Location: Left Arm, Patient Position: Sitting, Cuff Size: Normal)   Pulse 65   Temp (!) 97.4 F (36.3 C) (Temporal)   Ht 5\' 3"  (1.6 m)   Wt 130 lb (59 kg)   SpO2 99%   BMI 23.03 kg/m  General: no acute distress , A&Ox3 HEENT: PEERL, conjunctiva normal, Oropharynx moist,neck is supple Respiratory:  Good breath sounds bilaterally, CTAB with normal respiratory effort Skin:  Warm, no rashes Abd: no suprapubic ttp  Office Visit on 07/02/2019  Component Date Value Ref Range Status  . Color, UA 07/02/2019 Yellow   Final  . Clarity, UA 07/02/2019 Clear   Final  . Glucose, UA 07/02/2019 Negative  Negative Final  . Bilirubin, UA 07/02/2019 Positive   Final  . Ketones, UA 07/02/2019 Negative   Final  . Spec Grav, UA 07/02/2019 >=1.030* 1.010 - 1.025 Final  . Blood, UA 07/02/2019 Negative   Final  . pH, UA 07/02/2019 6.0  5.0 - 8.0 Final  . Protein, UA 07/02/2019 Negative  Negative Final  . Urobilinogen, UA 07/02/2019 0.2  0.2 or 1.0 E.U./dL Final  . Nitrite, UA 07/02/2019 Negative   Final  . Leukocytes, UA 07/02/2019 Negative  Negative Final      Commons side effects, risks, benefits, and alternatives for medications and treatment plan prescribed today were discussed, and the patient expressed understanding of the given instructions. Patient is instructed to call or message via MyChart if he/she has any questions or concerns regarding our  treatment plan. No barriers to understanding were identified. We discussed Red Flag symptoms and signs in detail. Patient expressed understanding regarding what to do in case of urgent or emergency type symptoms.   Medication list was reconciled, printed and provided to the patient in AVS. Patient instructions and summary information was reviewed with the patient as documented in the AVS. This note was prepared with assistance of Dragon voice recognition software. Occasional wrong-word or sound-a-like substitutions may have occurred due to the inherent limitations of voice recognition software

## 2019-07-02 NOTE — Patient Instructions (Signed)
Please return in 3 months for recheck.   Please start the ditropan daily; this should help your bladder symptoms.   If you have any questions or concerns, please don't hesitate to send me a message via MyChart or call the office at [phone]. Thank you for visiting with [place] today! It's our pleasure caring for you.

## 2019-07-03 LAB — URINE CULTURE
MICRO NUMBER:: 1119384
SPECIMEN QUALITY:: ADEQUATE

## 2019-07-06 ENCOUNTER — Other Ambulatory Visit: Payer: Self-pay | Admitting: Family Medicine

## 2019-07-15 ENCOUNTER — Other Ambulatory Visit: Payer: Self-pay | Admitting: Physician Assistant

## 2019-07-15 ENCOUNTER — Telehealth: Payer: Self-pay | Admitting: Physician Assistant

## 2019-07-15 DIAGNOSIS — M25561 Pain in right knee: Secondary | ICD-10-CM

## 2019-07-15 NOTE — Telephone Encounter (Signed)
Ok Rule out meniscal tear.

## 2019-07-15 NOTE — Telephone Encounter (Signed)
Left voicemail. Order placed.

## 2019-07-15 NOTE — Telephone Encounter (Signed)
Returned call patient states her right knee is worse, its painful with every step. Requesting MRI. Seen 06/25/19 with right knee injection and aspiration.  Please advise.

## 2019-07-15 NOTE — Telephone Encounter (Signed)
Patient states her right knee has gotten worse and request a call back asap.

## 2019-07-21 ENCOUNTER — Encounter

## 2019-07-21 ENCOUNTER — Ambulatory Visit: Payer: Medicare Other | Admitting: Family Medicine

## 2019-07-22 ENCOUNTER — Telehealth: Payer: Self-pay | Admitting: Internal Medicine

## 2019-07-22 NOTE — Telephone Encounter (Signed)
Follow Up ° °Patient is returning call. Please give patient a call back.  °

## 2019-07-22 NOTE — Telephone Encounter (Signed)
I spoke with pt. Her husband cannot be left alone and she has no one to stay with him.  He will attend appointment with pt

## 2019-07-22 NOTE — Telephone Encounter (Signed)
Left message to call back  

## 2019-07-22 NOTE — Telephone Encounter (Signed)
New Message  Pt is calling and stating that husband has to attend appt ok 07/23/19 with her due to him not being able to stay alone due to forgetfulness  Please call

## 2019-07-23 ENCOUNTER — Other Ambulatory Visit: Payer: Self-pay

## 2019-07-23 ENCOUNTER — Encounter: Payer: Self-pay | Admitting: Internal Medicine

## 2019-07-23 ENCOUNTER — Ambulatory Visit (INDEPENDENT_AMBULATORY_CARE_PROVIDER_SITE_OTHER): Payer: Medicare Other | Admitting: Internal Medicine

## 2019-07-23 VITALS — BP 138/60 | HR 63 | Ht 63.0 in | Wt 133.0 lb

## 2019-07-23 DIAGNOSIS — I4819 Other persistent atrial fibrillation: Secondary | ICD-10-CM | POA: Diagnosis not present

## 2019-07-23 DIAGNOSIS — I495 Sick sinus syndrome: Secondary | ICD-10-CM | POA: Diagnosis not present

## 2019-07-23 DIAGNOSIS — Z950 Presence of cardiac pacemaker: Secondary | ICD-10-CM | POA: Diagnosis not present

## 2019-07-23 DIAGNOSIS — I10 Essential (primary) hypertension: Secondary | ICD-10-CM | POA: Diagnosis not present

## 2019-07-23 DIAGNOSIS — I340 Nonrheumatic mitral (valve) insufficiency: Secondary | ICD-10-CM

## 2019-07-23 NOTE — Progress Notes (Signed)
PCP: [name], MD Primary Cardiologist: Dr Edison Simon Primary EP:  Dr Rayann Heman  Deanna Rivera is a 85 y.o. female who presents today for routine electrophysiology followup.  Since last being seen in our clinic, the patient reports doing very well.  She is active for her age.  Her husband has metastatic colon cancer and is starting immunotherapy after christmas.  She is quite anxious and having difficulty sleeping.  Today, she denies symptoms of palpitations, chest pain, shortness of breath,  lower extremity edema, dizziness, presyncope, or syncope.  The patient is otherwise without complaint today.   Past Medical History:  Diagnosis Date  . Cataract   . Hypertension   . Moderate aortic stenosis   . Moderate mitral regurgitation   . Persistent atrial fibrillation ([place])   . PMR (polymyalgia rheumatica) (HCC)   . Second degree AV block   . Sick sinus syndrome ([place])   . Thyroid disease    Past Surgical History:  Procedure Laterality Date  . ABDOMINAL HYSTERECTOMY    . CESAREAN SECTION    . CHOLECYSTECTOMY    . PACEMAKER GENERATOR CHANGE  11/20/2018   [employer] Accolade MRI PPM implanted in [place]  . PACEMAKER IMPLANT    . PILONIDAL CYST EXCISION      ROS- all systems are reviewed and negative except as per HPI above  Current Outpatient Medications  Medication Sig Dispense Refill  . dabigatran (PRADAXA) 150 MG CAPS capsule Take 1 capsule (150 mg total) by mouth every 12 (twelve) hours. 180 capsule 3  . digoxin (LANOXIN) 0.25 MG tablet TAKE 1 TABLET BY MOUTH  DAILY 90 tablet 3  . hydrochlorothiazide (HYDRODIURIL) 25 MG tablet TAKE 1 TABLET BY MOUTH  DAILY 90 tablet 3  . levothyroxine (SYNTHROID) 112 MCG tablet Take 1 tablet (112 mcg total) by mouth daily. 90 tablet 3  . Metoprolol Succinate 50 MG CS24 Take 50 mg by mouth daily. 90 capsule 2  . oxybutynin (DITROPAN XL) 10 MG 24 hr tablet Take 1 tablet (10 mg total) by mouth at bedtime. 30 tablet 5  . predniSONE  (DELTASONE) 1 MG tablet Take 1 tablet (1 mg total) by mouth daily. 90 tablet 1   No current facility-administered medications for this visit.    Physical Exam: Vitals:   07/23/19 1438  BP: 138/60  Pulse: 63  SpO2: 98%  Weight: 133 lb (60.3 kg)  Height: 5\' 3"  (1.6 m)    GEN- The patient is well appearing, alert and oriented x 3 today.   Head- normocephalic, atraumatic Eyes-  Sclera clear, conjunctiva pink Ears- hearing intact Oropharynx- clear Lungs- , normal work of breathing Chest- pacemaker pocket is well healed Heart- Regular rate and rhythm  (paced) GI- soft  Extremities- no clubbing, cyanosis, or edema  Pacemaker interrogation- reviewed in detail today,  See PACEART report  Echo 04/16/2019 is reviewed in detail with patient and family.  EF 60%, mild LVH, moderate LA enlargement, mild to moderate MR, mild to moderate aortic stenosis  Assessment and Plan:  1. Symptomatic sinus bradycardia and second degree AV block Normal pacemaker function See Pace Art report No changes today she is not device dependant today Consider programming VVIR if she remains in afib  2. HTN Stable No change required today  3. afib Rate controlled afib burden is 100 %  She is on pradaxa and has labs followed by primary care  4. Mitral regurgitation Mild to moderate by recent echo.  She is very surprised as  she has been told that she has severe MR for years.  She was quite anxious about this today.  We discussed this at length.  Repeat echo in a year.  Given her advanced age, ultimately, we may decide to stop following by echo at that time.  Remote monitoring Return to see EP PA every 6 months I will see when needed  Thompson Grayer MD, [facility] 07/23/2019 3:11 PM

## 2019-07-23 NOTE — Patient Instructions (Addendum)
Medication Instructions:  Your physician recommends that you continue on your current medications as directed. Please refer to the Current Medication list given to you today.  Labwork: None ordered.  Testing/Procedures: None ordered.  Follow-Up: Your physician wants you to follow-up in: 6 months with Tommye Standard, PA.   You will receive a reminder letter in the mail two months in advance. If you don't receive a letter, please call our office to schedule the follow-up appointment.  Remote monitoring is used to monitor your Pacemaker from home. This monitoring reduces the number of office visits required to check your device to one time per year. It allows [place] to keep an eye on the functioning of your device to ensure it is working properly. You are scheduled for a device check from home on 08/12/2019. You may send your transmission at any time that day. If you have a wireless device, the transmission will be sent automatically. After your physician reviews your transmission, you will receive a postcard with your next transmission date.  Any Other Special Instructions Will Be Listed Below (If Applicable).  If you need a refill on your cardiac medications before your next appointment, please call your pharmacy.

## 2019-07-27 ENCOUNTER — Other Ambulatory Visit: Payer: Self-pay | Admitting: Radiology

## 2019-07-27 DIAGNOSIS — M25561 Pain in right knee: Secondary | ICD-10-CM

## 2019-07-27 DIAGNOSIS — M1711 Unilateral primary osteoarthritis, right knee: Secondary | ICD-10-CM

## 2019-07-28 LAB — CUP PACEART INCLINIC DEVICE CHECK
Brady Statistic RA Percent Paced: 0 %
Brady Statistic RV Percent Paced: 94 %
Date Time Interrogation Session: 20201210114437
Implantable Lead Implant Date: 20130305
Implantable Lead Implant Date: 20130305
Implantable Lead Location: 753859
Implantable Lead Location: 753860
Implantable Lead Model: 4469
Implantable Lead Model: 4470
Implantable Lead Serial Number: 546229
Implantable Lead Serial Number: 712016
Implantable Pulse Generator Implant Date: 20200409
Lead Channel Pacing Threshold Amplitude: 0.5 V
Lead Channel Pacing Threshold Pulse Width: 0.4 ms
Lead Channel Sensing Intrinsic Amplitude: 1.4 mV
Lead Channel Sensing Intrinsic Amplitude: 22.3 mV
Lead Channel Setting Pacing Amplitude: 1 V
Lead Channel Setting Pacing Amplitude: 2.5 V
Lead Channel Setting Pacing Pulse Width: 0.4 ms
Lead Channel Setting Sensing Sensitivity: 0.6 mV
Pulse Gen Serial Number: 859007

## 2019-08-04 ENCOUNTER — Telehealth: Payer: Self-pay | Admitting: Family Medicine

## 2019-08-04 NOTE — Telephone Encounter (Signed)
Please advise 

## 2019-08-04 NOTE — Telephone Encounter (Signed)
Called patient to see if she needed any follow up appointments, when I spoke with her she expressed a concern about how she and her husband were here in November for a possible UTI, she said her husbands urinalysis came back positive for bacteria and she said her results came through [place] saying it had been contaminated, ms.Myktyn said she would like to follow up with Dr.Andy about that before she has to get her covid vaccine.

## 2019-08-06 NOTE — Telephone Encounter (Signed)
Please inform pt that she DID not have a urinary tract infection. "contamination" refers to bacteria from the vaginal area and her culture was negative for infection. She does not need a recheck.  She can get the vaccine when it is available.

## 2019-08-10 NOTE — Telephone Encounter (Signed)
Patient informed of message and states she will get the vaccine on Wednesday.

## 2019-08-12 ENCOUNTER — Ambulatory Visit (INDEPENDENT_AMBULATORY_CARE_PROVIDER_SITE_OTHER): Payer: Medicare Other | Admitting: *Deleted

## 2019-08-12 DIAGNOSIS — I495 Sick sinus syndrome: Secondary | ICD-10-CM

## 2019-08-12 LAB — CUP PACEART REMOTE DEVICE CHECK
Battery Remaining Longevity: 132 mo
Battery Remaining Percentage: 100 %
Brady Statistic RA Percent Paced: 0 %
Brady Statistic RV Percent Paced: 83 %
Date Time Interrogation Session: 20201230064100
Implantable Lead Implant Date: 20130305
Implantable Lead Implant Date: 20130305
Implantable Lead Location: 753859
Implantable Lead Location: 753860
Implantable Lead Model: 4469
Implantable Lead Model: 4470
Implantable Lead Serial Number: 546229
Implantable Lead Serial Number: 712016
Implantable Pulse Generator Implant Date: 20200409
Lead Channel Impedance Value: 429 Ohm
Lead Channel Impedance Value: 535 Ohm
Lead Channel Pacing Threshold Amplitude: 0.6 V
Lead Channel Pacing Threshold Pulse Width: 0.4 ms
Lead Channel Setting Pacing Amplitude: 1.1 V
Lead Channel Setting Pacing Amplitude: 2.5 V
Lead Channel Setting Pacing Pulse Width: 0.4 ms
Lead Channel Setting Sensing Sensitivity: 0.6 mV
Pulse Gen Serial Number: 859007

## 2019-08-29 ENCOUNTER — Other Ambulatory Visit: Payer: Self-pay | Admitting: Family Medicine

## 2019-09-04 ENCOUNTER — Telehealth: Payer: Self-pay | Admitting: Physician Assistant

## 2019-09-04 NOTE — Telephone Encounter (Signed)
Called patient left voicemail message to return call to schedule and appointment for MRI review with Artis Delay or Dr Ninfa Linden

## 2019-09-05 ENCOUNTER — Telehealth: Payer: Self-pay | Admitting: Internal Medicine

## 2019-09-05 NOTE — Telephone Encounter (Addendum)
Called to notify pt about covid vaccine no answer (lvm )Left voicemail informing patient their vaccination tier has arrived, we would like to assist them with scheduling an appointment for their first dose of Covid-19 Vaccine. Advised to call back @ [phone].

## 2019-09-12 NOTE — Telephone Encounter (Signed)
Notified patient of been able to  Receive Covid vaccine. Which patient stated spouse and patient moved recently to [place] (8) months ago. And would like to cancel the reservation of the vaccine. Since being able to receive 2nd dosage already as of  09/11/19. In  Nurse living assisted environment.

## 2019-09-15 ENCOUNTER — Ambulatory Visit (HOSPITAL_COMMUNITY)
Admission: RE | Admit: 2019-09-15 | Discharge: 2019-09-15 | Disposition: A | Discharge status: Home / Self Care | Payer: Medicare Other | Source: Ambulatory Visit | Attending: Physician Assistant | Admitting: Physician Assistant

## 2019-09-15 ENCOUNTER — Other Ambulatory Visit: Payer: Self-pay

## 2019-09-15 DIAGNOSIS — M1711 Unilateral primary osteoarthritis, right knee: Secondary | ICD-10-CM

## 2019-09-24 ENCOUNTER — Ambulatory Visit (INDEPENDENT_AMBULATORY_CARE_PROVIDER_SITE_OTHER): Payer: Medicare Other | Admitting: Physician Assistant

## 2019-09-24 ENCOUNTER — Other Ambulatory Visit: Payer: Self-pay

## 2019-09-24 ENCOUNTER — Encounter: Payer: Self-pay | Admitting: Physician Assistant

## 2019-09-24 DIAGNOSIS — M1711 Unilateral primary osteoarthritis, right knee: Secondary | ICD-10-CM

## 2019-09-24 NOTE — Progress Notes (Signed)
HPI: Mrs.Kykytyn comes in today for follow-up of her right knee.  She underwent a CT scan of her knee and MRI was ordered however it was deemed that her pacemaker was not compatible however she does state that it is compatible with MRI.  She continues to have significant pain in the knee.  She states over the weekend after doing some walking that she had severe pain and had to lie down for the next day he could not really bear weight on the leg. CT of the MRI dated 09/15/2019 is reviewed with the patient and her husband who is present throughout the office visit today.  MRI showed no acute findings.  Tricompartmental arthritis with moderate to severe medial compartmental joint space narrowing with osteophytes.  Mild patellofemoral compartment lateral compartmental joint space narrowing.  No meniscal tear.  Moderate Baker's cyst. Patient had questions about what could be done in regards to her knee pain due to the fact that the cortisone injection did not help.  She is also tried braces and walks with a rolling walker.  Impression: Right knee tricompartmental arthritis  Plan: Discussed with her that we could try a supplemental injection in the.  She is also tracking braces.  Work on Forensic scientist.  Again she has been told that she is not a surgical candidate.  Radiologist did note that her bone appeared osteopenic she is wondering what supplements as well as vitamins she should be taking.  Like for her to take visit with her primary care physician how they would like to proceed with any supplements such as vitamin D and calcium.  She will follow-up with [place] on an as-needed basis.  A handout on supplemental injections was given.  She will let us know if she would like to proceed with these in the future.

## 2019-10-07 ENCOUNTER — Telehealth: Payer: Self-pay | Admitting: Internal Medicine

## 2019-10-07 NOTE — Telephone Encounter (Signed)
Pt c/o BP issue: STAT if pt c/o blurred vision, one-sided weakness or slurred speech  1. What are your last 5 BP readings?  2/24 157/86 2/23 170/97 ?      175/87 ?      169/91 ?      210/96  2. Are you having any other symptoms (ex. Dizziness, headache, blurred vision, passed out)? Just a slight headache  3. What is your BP issue? Elevated BP, she feeling her medication needs to be adjusted.

## 2019-10-09 NOTE — Telephone Encounter (Signed)
See mychart thread.  Pt to increase toprol XL to BID.  Pt will keep log of blood pressures.

## 2019-10-16 ENCOUNTER — Other Ambulatory Visit: Payer: Self-pay

## 2019-10-16 DIAGNOSIS — G7241 Inclusion body myositis [IBM]: Secondary | ICD-10-CM

## 2019-10-16 MED ORDER — PREDNISONE 1 MG PO TABS: 1.0000 mg | ORAL_TABLET | Freq: Every day | ORAL | 3 refills | Status: DC

## 2019-10-16 NOTE — Telephone Encounter (Signed)
LAST APPOINTMENT DATE: 07/02/2019  NEXT APPOINTMENT DATE:@Visit  date not found   LAST REFILL: 01/29/2019  QTY: 90 TABLET

## 2019-10-20 ENCOUNTER — Telehealth: Payer: Self-pay | Admitting: Family Medicine

## 2019-10-20 NOTE — Telephone Encounter (Signed)
I left a message asking the patient to call and schedule Medicare AWV with Loma Sousa ([place]).  If patient calls back, please schedule Medicare Wellness Visit at next available opening. Last AWV January 2019 VDM ([place])

## 2019-10-23 ENCOUNTER — Encounter: Payer: Self-pay | Admitting: Family Medicine

## 2019-10-23 ENCOUNTER — Ambulatory Visit (INDEPENDENT_AMBULATORY_CARE_PROVIDER_SITE_OTHER): Payer: Medicare Other | Admitting: Family Medicine

## 2019-10-23 VITALS — BP 126/81 | HR 61 | Ht 63.0 in | Wt 133.0 lb

## 2019-10-23 DIAGNOSIS — M1711 Unilateral primary osteoarthritis, right knee: Secondary | ICD-10-CM

## 2019-10-23 DIAGNOSIS — M353 Polymyalgia rheumatica: Secondary | ICD-10-CM

## 2019-10-23 DIAGNOSIS — E038 Other specified hypothyroidism: Secondary | ICD-10-CM

## 2019-10-23 DIAGNOSIS — R5383 Other fatigue: Secondary | ICD-10-CM

## 2019-10-23 DIAGNOSIS — I10 Essential (primary) hypertension: Secondary | ICD-10-CM

## 2019-10-23 DIAGNOSIS — E063 Autoimmune thyroiditis: Secondary | ICD-10-CM

## 2019-10-23 DIAGNOSIS — I495 Sick sinus syndrome: Secondary | ICD-10-CM | POA: Diagnosis not present

## 2019-10-23 DIAGNOSIS — E46 Unspecified protein-calorie malnutrition: Secondary | ICD-10-CM

## 2019-10-23 DIAGNOSIS — E042 Nontoxic multinodular goiter: Secondary | ICD-10-CM

## 2019-10-23 DIAGNOSIS — R7301 Impaired fasting glucose: Secondary | ICD-10-CM

## 2019-10-23 DIAGNOSIS — Z7901 Long term (current) use of anticoagulants: Secondary | ICD-10-CM

## 2019-10-23 DIAGNOSIS — E559 Vitamin D deficiency, unspecified: Secondary | ICD-10-CM

## 2019-10-23 NOTE — Progress Notes (Signed)
Subjective  CC:  Chief Complaint  Patient presents with  . Hypertension    has been elevated lately. icreased metoprolol per cardiologist. also takes hctz. requesting blood work.  . right knee    weak. not a candidate for surgery. requesting ensure and therapy    HPI: Deanna Rivera is a 86 y.o. female who presents to the office today to address the problems listed above in the chief complaint.   Hypertension f/u: reviewed notes from chart; cards has increased bb. She has a question about it: is this a good option?  "i'm a wreck" ... feels fatigued; worries about her nutrition because there is too much sodium served in the food cooked for her. Wants blood work checked. Denies specific complaints like sob, cp, palpitations, f/c/s or cold /cough sxs. No GI sxs.   Knee pain: reviewed OA notes. Has tricompartmental OA w/ neg acute MRI. No relief with steroid injection. Seeing PT.   Vit d deficiency.   Low thyroid   Assessment  1. Primary osteoarthritis of right knee   2. Benign essential hypertension   3. SSS (sick sinus syndrome) ([place])   4. Current use of long term anticoagulation   5. Fatigue, unspecified type   6. Nontoxic multinodular goiter   7. Impaired fasting glucose   8. Polymyalgia rheumatica ([place])   9. Hypothyroidism due to Hashimoto's thyroiditis, Rx Levothyroxine   10. Vitamin D deficiency   11. Unspecified protein-calorie malnutrition ([place])       Plan    Hypertension f/u: agreed with cardiology and reassured her about increasing toprol xl. Has f/u with cards. Discussed need to liberalize her salt restriction but she is adamant about this. Can use ensure as needed.   fatigue: will return for blood work. Will check vitamin levels, nutritional status, thyroid, sugars etc.  Education regarding management of these chronic disease states was given. Management strategies discussed on successive visits include dietary and exercise recommendations, goals of achieving  and maintaining IBW, and lifestyle modifications aiming for adequate sleep and minimizing stressors.   Follow up: Return in about 3 months (around 01/23/2020) for recheck.  Orders Placed This Encounter  Procedures  . CBC with Differential/Platelet  . Comprehensive metabolic panel  . B12 and Folate Panel  . Iron, TIBC and Ferritin Panel  . TSH  . VITAMIN D 25 Hydroxy (Vit-D Deficiency, Fractures)  . Hemoglobin A1c  . Sedimentation rate   No orders of the defined types were placed in this encounter.     BP Readings from Last 3 Encounters:  10/23/19 126/81  07/23/19 138/60  07/02/19 128/84   Wt Readings from Last 3 Encounters:  10/23/19 133 lb (60.3 kg)  07/23/19 133 lb (60.3 kg)  07/02/19 130 lb (59 kg)    Lab Results  Component Value Date   CHOL 188 03/19/2019   Lab Results  Component Value Date   HDL 56.60 03/19/2019   Lab Results  Component Value Date   LDLCALC 105 (H) 03/19/2019   Lab Results  Component Value Date   TRIG 134.0 03/19/2019   Lab Results  Component Value Date   CHOLHDL 3 03/19/2019   No results found for: LDLDIRECT Lab Results  Component Value Date   CREATININE 0.35 (L) 03/19/2019   BUN 17 03/19/2019   NA 133 (L) 03/19/2019   K 4.2 03/19/2019   CL 96 03/19/2019   CO2 31 03/19/2019    The ASCVD Risk score Mikey Bussing DC Jr., et al., 2013) failed to calculate  for the following reasons:   The 2013 ASCVD risk score is only valid for ages 53 to 91  I reviewed the patients updated PMH, FH, and SocHx.    Patient Active Problem List   Diagnosis Date Noted  . SSS (sick sinus syndrome) ([place]) 02/11/2019    Priority: High  . Status post placement of cardiac pacemaker 07/08/2018    Priority: High  . 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 Nontoxic multinodular goiter 09/08/2015    Priority: High  . Current use of long term anticoagulation 05/16/2015    Priority: High  . (HFpEF) heart failure with preserved ejection fraction  ([place]) 09/13/2014    Priority: High  . Pacemaker 11/09/2011    Priority: High  . Impaired fasting glucose 05/23/2009    Priority: High  . Long term current use of systemic steroids 03/30/2008    Priority: High  . Polymyalgia rheumatica ([place]) 03/30/2008    Priority: High  . Benign essential hypertension 05/05/2007    Priority: High  . Persistent atrial fibrillation ([place]) 11/06/2006    Priority: High  . Pure hypercholesterolemia 11/06/2006    Priority: High  . Hypothyroidism due to Hashimoto's thyroiditis, Rx Levothyroxine 06/25/2005    Priority: High  . Primary osteoarthritis of right knee 10/23/2019    Priority: Medium  . Nonrheumatic mitral valve regurgitation 09/16/2015    Priority: Medium  . Recurrent UTI 03/14/2015    Priority: Medium  . Neuralgia, postherpetic 07/02/2019    Priority: Low  . OAB (overactive bladder) 07/02/2019    Priority: Low  . Constipation 01/18/2019    Priority: Low  . Shellfish allergy 11/22/2015  . Synovial cyst of popliteal space 11/22/2008    Allergies: Monosodium glutamate, Epinephrine, Erythromycin, Shellfish allergy, Sulfamethoxazole-trimethoprim, and Tetracyclines & related  Social History: Patient  reports that she has never smoked. She has never used smokeless tobacco. She reports that she does not drink alcohol or use drugs.  Current Meds  Medication Sig  . dabigatran (PRADAXA) 150 MG CAPS capsule Take 1 capsule (150 mg total) by mouth every 12 (twelve) hours.  . digoxin (LANOXIN) 0.25 MG tablet TAKE 1 TABLET BY MOUTH  DAILY  . hydrochlorothiazide (HYDRODIURIL) 25 MG tablet TAKE 1 TABLET BY MOUTH  DAILY  . levothyroxine (SYNTHROID) 112 MCG tablet Take 1 tablet (112 mcg total) by mouth daily.  . Metoprolol Succinate 50 MG CS24 Take 50 mg by mouth daily.  . predniSONE (DELTASONE) 1 MG tablet Take 1 tablet (1 mg total) by mouth daily.    Review of Systems: Cardiovascular: negative for chest pain, palpitations, leg swelling,  orthopnea Respiratory: negative for SOB, wheezing or persistent cough Gastrointestinal: negative for abdominal pain Genitourinary: negative for dysuria or gross hematuria  Objective  Vitals: BP 126/81 (BP Location: Left Arm, Patient Position: Sitting, Cuff Size: Normal)   Pulse 61 Comment: pacemaker  Ht 5\' 3"  (1.6 m)   Wt 133 lb (60.3 kg)   SpO2 98%   BMI 23.56 kg/m  General: no acute distress  Psych:  Alert and oriented, normal mood and affect HEENT:  Normocephalic, atraumatic, supple neck  Cardiovascular:  RRR without murmur. no edema Respiratory:  Good breath sounds bilaterally, CTAB with normal respiratory effort Skin:  Warm, no rashes Neurologic:   Mental status is normal  Commons side effects, risks, benefits, and alternatives for medications and treatment plan prescribed today were discussed, and the patient expressed understanding of the given instructions. Patient is instructed to call or message  via MyChart if he/she has any questions or concerns regarding our treatment plan. No barriers to understanding were identified. We discussed Red Flag symptoms and signs in detail. Patient expressed understanding regarding what to do in case of urgent or emergency type symptoms.   Medication list was reconciled, printed and provided to the patient in AVS. Patient instructions and summary information was reviewed with the patient as documented in the AVS. This note was prepared with assistance of Dragon voice recognition software. Occasional wrong-word or sound-a-like substitutions may have occurred due to the inherent limitations of voice recognition software  This visit occurred during the SARS-CoV-2 public health emergency.  Safety protocols were in place, including screening questions prior to the visit, additional usage of staff PPE, and extensive cleaning of exam room while observing appropriate contact time as indicated for disinfecting solutions.

## 2019-10-23 NOTE — Progress Notes (Signed)
Called pt and LVM to schedule lab & f/u appt

## 2019-10-27 ENCOUNTER — Other Ambulatory Visit (INDEPENDENT_AMBULATORY_CARE_PROVIDER_SITE_OTHER): Payer: Medicare Other

## 2019-10-27 ENCOUNTER — Other Ambulatory Visit: Payer: Self-pay

## 2019-10-27 DIAGNOSIS — E042 Nontoxic multinodular goiter: Secondary | ICD-10-CM

## 2019-10-27 DIAGNOSIS — M353 Polymyalgia rheumatica: Secondary | ICD-10-CM

## 2019-10-27 DIAGNOSIS — I10 Essential (primary) hypertension: Secondary | ICD-10-CM | POA: Diagnosis not present

## 2019-10-27 DIAGNOSIS — E46 Unspecified protein-calorie malnutrition: Secondary | ICD-10-CM | POA: Diagnosis not present

## 2019-10-27 DIAGNOSIS — E559 Vitamin D deficiency, unspecified: Secondary | ICD-10-CM

## 2019-10-27 DIAGNOSIS — I495 Sick sinus syndrome: Secondary | ICD-10-CM

## 2019-10-27 DIAGNOSIS — R5383 Other fatigue: Secondary | ICD-10-CM

## 2019-10-27 DIAGNOSIS — R7301 Impaired fasting glucose: Secondary | ICD-10-CM | POA: Diagnosis not present

## 2019-10-27 LAB — CBC WITH DIFFERENTIAL/PLATELET
Basophils Absolute: 0.1 10*3/uL (ref 0.0–0.1)
Basophils Relative: 0.9 % (ref 0.0–3.0)
Eosinophils Absolute: 0.1 10*3/uL (ref 0.0–0.7)
Eosinophils Relative: 1.7 % (ref 0.0–5.0)
HCT: 40.9 % (ref 36.0–46.0)
Hemoglobin: 13.6 g/dL (ref 12.0–15.0)
Lymphocytes Relative: 37.6 % (ref 12.0–46.0)
Lymphs Abs: 2.4 10*3/uL (ref 0.7–4.0)
MCHC: 33.3 g/dL (ref 30.0–36.0)
MCV: 95.5 fl (ref 78.0–100.0)
Monocytes Absolute: 0.7 10*3/uL (ref 0.1–1.0)
Monocytes Relative: 11.2 % (ref 3.0–12.0)
Neutro Abs: 3.1 10*3/uL (ref 1.4–7.7)
Neutrophils Relative %: 48.6 % (ref 43.0–77.0)
Platelets: 270 10*3/uL (ref 150.0–400.0)
RBC: 4.28 Mil/uL (ref 3.87–5.11)
RDW: 13.7 % (ref 11.5–15.5)
WBC: 6.3 10*3/uL (ref 4.0–10.5)

## 2019-10-27 LAB — COMPREHENSIVE METABOLIC PANEL
ALT: 30 U/L (ref 0–35)
AST: 32 U/L (ref 0–37)
Albumin: 3.8 g/dL (ref 3.5–5.2)
Alkaline Phosphatase: 59 U/L (ref 39–117)
BUN: 14 mg/dL (ref 6–23)
CO2: 31 mEq/L (ref 19–32)
Calcium: 10.3 mg/dL (ref 8.4–10.5)
Chloride: 99 mEq/L (ref 96–112)
Creatinine, Ser: 0.33 mg/dL — ABNORMAL LOW (ref 0.40–1.20)
GFR: 189.54 mL/min (ref >60.00)
Glucose, Bld: 110 mg/dL — ABNORMAL HIGH (ref 70–99)
Potassium: 4.1 mEq/L (ref 3.5–5.1)
Sodium: 137 mEq/L (ref 135–145)
Total Bilirubin: 0.7 mg/dL (ref 0.2–1.2)
Total Protein: 7.5 g/dL (ref 6.0–8.3)

## 2019-10-27 LAB — TSH: TSH: 1.23 u[IU]/mL (ref 0.35–4.50)

## 2019-10-27 LAB — B12 AND FOLATE PANEL
Folate: >23.7 ng/mL (ref >5.9)
Vitamin B-12: 426 pg/mL (ref 211–911)

## 2019-10-27 LAB — SEDIMENTATION RATE: Sed Rate: 40 mm/hr — ABNORMAL HIGH (ref 0–30)

## 2019-10-27 LAB — HEMOGLOBIN A1C: Hgb A1c MFr Bld: 5.8 % (ref 4.6–6.5)

## 2019-10-27 LAB — VITAMIN D 25 HYDROXY (VIT D DEFICIENCY, FRACTURES): VITD: 34.95 ng/mL (ref 30.00–100.00)

## 2019-10-28 LAB — IRON,TIBC AND FERRITIN PANEL
%SAT: 32 % (calc) (ref 16–45)
Ferritin: 70 ng/mL (ref 16–288)
Iron: 112 ug/dL (ref 45–160)
TIBC: 349 mcg/dL (calc) (ref 250–450)

## 2019-10-28 LAB — PREALBUMIN: Prealbumin: 17 mg/dL (ref 17–34)

## 2019-11-05 ENCOUNTER — Other Ambulatory Visit: Payer: Self-pay | Admitting: Family Medicine

## 2019-11-05 DIAGNOSIS — I5032 Chronic diastolic (congestive) heart failure: Secondary | ICD-10-CM

## 2019-11-11 ENCOUNTER — Ambulatory Visit (INDEPENDENT_AMBULATORY_CARE_PROVIDER_SITE_OTHER): Payer: Medicare Other | Admitting: *Deleted

## 2019-11-11 ENCOUNTER — Other Ambulatory Visit: Payer: Self-pay

## 2019-11-11 DIAGNOSIS — I495 Sick sinus syndrome: Secondary | ICD-10-CM | POA: Diagnosis not present

## 2019-11-11 DIAGNOSIS — I5032 Chronic diastolic (congestive) heart failure: Secondary | ICD-10-CM

## 2019-11-11 LAB — CUP PACEART REMOTE DEVICE CHECK
Battery Remaining Longevity: 132 mo
Battery Remaining Percentage: 100 %
Brady Statistic RA Percent Paced: 0 %
Brady Statistic RV Percent Paced: 85 %
Date Time Interrogation Session: 20210331064100
Implantable Lead Implant Date: 20130305
Implantable Lead Implant Date: 20130305
Implantable Lead Location: 753859
Implantable Lead Location: 753860
Implantable Lead Model: 4469
Implantable Lead Model: 4470
Implantable Lead Serial Number: 546229
Implantable Lead Serial Number: 712016
Implantable Pulse Generator Implant Date: 20200409
Lead Channel Impedance Value: 449 Ohm
Lead Channel Impedance Value: 543 Ohm
Lead Channel Pacing Threshold Amplitude: 0.6 V
Lead Channel Pacing Threshold Pulse Width: 0.4 ms
Lead Channel Setting Pacing Amplitude: 1.1 V
Lead Channel Setting Pacing Amplitude: 2.5 V
Lead Channel Setting Pacing Pulse Width: 0.4 ms
Lead Channel Setting Sensing Sensitivity: 0.6 mV
Pulse Gen Serial Number: 859007

## 2019-11-11 MED ORDER — METOPROLOL SUCCINATE 50 MG PO CS24: 50.0000 mg | EXTENDED_RELEASE_CAPSULE | Freq: Every day | ORAL | 2 refills | Status: DC

## 2019-11-11 NOTE — Progress Notes (Signed)
PPM Remote  

## 2019-11-12 ENCOUNTER — Telehealth: Payer: Self-pay

## 2019-11-12 ENCOUNTER — Ambulatory Visit (INDEPENDENT_AMBULATORY_CARE_PROVIDER_SITE_OTHER): Payer: Medicare Other

## 2019-11-12 ENCOUNTER — Other Ambulatory Visit: Payer: Self-pay

## 2019-11-12 ENCOUNTER — Telehealth: Payer: Self-pay | Admitting: Family Medicine

## 2019-11-12 DIAGNOSIS — Z0000 Encounter for general adult medical examination without abnormal findings: Secondary | ICD-10-CM

## 2019-11-12 NOTE — Telephone Encounter (Signed)
Patient states that she would like to know if there are other options for incontinence that are not very invasive.  She is not taking Oxybutynin.   Also would like more input on GFR values from recent labs.

## 2019-11-12 NOTE — Progress Notes (Signed)
This visit is being conducted via phone call due to the COVID-19 pandemic. This patient has given me verbal consent via phone to conduct this visit, patient states they are participating from their home address. Some vital signs may be absent or patient reported.   Patient identification: identified by name, DOB, and current address.  Location provider: [facility], Office Persons participating in the virtual visit: Denman George LPN, patient, and Dr. Orma Flaming   Subjective:   Deanna Rivera is a 86 y.o. female who presents for Medicare Annual (Subsequent) preventive examination.  Review of Systems:   Cardiac Risk Factors include: advanced age (>65men, >72 women);hypertension    Objective:     Vitals: There were no vitals taken for this visit.  There is no height or weight on file to calculate BMI.  Advanced Directives 11/12/2019  Does Patient Have a Medical Advance Directive? Yes  Type of Advance Directive Living will;Healthcare Power of Attorney  Does patient want to make changes to medical advance directive? No - Patient declined  Copy of [place] in Chart? No - copy requested    Tobacco Social History   Tobacco Use  Smoking Status Never Smoker  Smokeless Tobacco Never Used     Counseling given: Not Answered   Clinical Intake:  Pre-visit preparation completed: Yes  Pain : No/denies pain  Diabetes: No  How often do you need to have someone help you when you read instructions, pamphlets, or other written materials from your doctor or pharmacy?: 1 - Never  Interpreter Needed?: No  Information entered by :: Denman George LPN  Past Medical History:  Diagnosis Date  . Cataract   . Hypertension   . Moderate aortic stenosis   . Moderate mitral regurgitation   . Persistent atrial fibrillation ([place])   . PMR (polymyalgia rheumatica) ([facility])   . Second degree AV block   . Sick sinus syndrome ([place])   . Thyroid disease    Past Surgical  History:  Procedure Laterality Date  . ABDOMINAL HYSTERECTOMY    . CESAREAN SECTION    . CHOLECYSTECTOMY    . PACEMAKER GENERATOR CHANGE  11/20/2018   [employer] Accolade MRI PPM implanted in [place]  . PACEMAKER IMPLANT    . PILONIDAL CYST EXCISION     Family History  Problem Relation Age of Onset  . Early death Mother 32  . Hypertension Brother   . Cirrhosis Father 62  . Lung cancer Brother 49  . Scleroderma Daughter    Social History   Socioeconomic History  . Marital status: Married    Spouse name: Not on file  . Number of children: Not on file  . Years of education: Not on file  . Highest education level: Not on file  Occupational History  . Occupation: Retired     Comment: Oncologist   Tobacco Use  . Smoking status: Never Smoker  . Smokeless tobacco: Never Used  Substance and Sexual Activity  . Alcohol use: No  . Drug use: No  . Sexual activity: Not Currently    Birth control/protection: Post-menopausal  Other Topics Concern  . Not on file  Social History Narrative   Recently moved from [place]. In independent living with her husband. Daughter died from scleroderma complications. Hx of falls, but last was > 1.5 years ago. 01/18/2019    Social Determinants of Health   Financial Resource Strain:   . Difficulty of Paying Living Expenses:   Food Insecurity:   .  Worried About Charity fundraiser in the Last Year:   . Arboriculturist in the Last Year:   Transportation Needs:   . Film/video editor (Medical):   Marland Kitchen Lack of Transportation (Non-Medical):   Physical Activity: Inactive  . Days of Exercise per Week: 0 days  . Minutes of Exercise per Session: 0 min  Stress:   . Feeling of Stress :   Social Connections:   . Frequency of Communication with Friends and Family:   . Frequency of Social Gatherings with Friends and Family:   . Attends Religious Services:   . Active Member of Clubs or Organizations:   . Attends Theatre manager Meetings:   Marland Kitchen Marital Status:     Outpatient Encounter Medications as of 11/12/2019  Medication Sig  . dabigatran (PRADAXA) 150 MG CAPS capsule Take 1 capsule (150 mg total) by mouth every 12 (twelve) hours.  . digoxin (LANOXIN) 0.25 MG tablet TAKE 1 TABLET BY MOUTH  DAILY  . hydrochlorothiazide (HYDRODIURIL) 25 MG tablet TAKE 1 TABLET BY MOUTH  DAILY  . levothyroxine (SYNTHROID) 112 MCG tablet Take 1 tablet (112 mcg total) by mouth daily.  . Metoprolol Succinate 50 MG CS24 Take 50 mg by mouth daily.  . predniSONE (DELTASONE) 1 MG tablet Take 1 tablet (1 mg total) by mouth daily.  Marland Kitchen oxybutynin (DITROPAN XL) 10 MG 24 hr tablet Take 1 tablet (10 mg total) by mouth at bedtime. (Patient not taking: Reported on 10/23/2019)   No facility-administered encounter medications on file as of 11/12/2019.    Activities of Daily Living In your present state of health, do you have any difficulty performing the following activities: 11/12/2019 03/30/2019  Hearing? N N  Vision? N Y  Difficulty concentrating or making decisions? N N  Walking or climbing stairs? Y Y  Dressing or bathing? N N  Doing errands, shopping? Y N  Preparing Food and eating ? N -  Using the Toilet? N -  In the past six months, have you accidently leaked urine? Y -  Do you have problems with loss of bowel control? N -  Managing your Medications? N -  Managing your Finances? N -  Housekeeping or managing your Housekeeping? N -  Some recent data might be hidden    Patient Care Team: [name], MD as PCP - General (Family Medicine) Thompson Grayer, MD as Consulting Physician (Cardiology) Georgia Lopes as Physician Assistant (Orthopedic Surgery)    Assessment:   This is a routine wellness examination for [facility].  Exercise Activities and Dietary recommendations Current Exercise Habits: The patient does not participate in regular exercise at present  Goals   None     Fall Risk Fall Risk  11/12/2019  03/30/2019 02/23/2019 01/12/2019  Falls in the past year? 1 1 0 1  Number falls in past yr: 1 1 0 0  Injury with Fall? 0 1 0 0  Risk for fall due to : Impaired balance/gait;Impaired mobility;History of fall(s) Impaired mobility - History of fall(s);Impaired mobility  Follow up Education provided;Falls prevention discussed;Falls evaluation completed Falls evaluation completed - -   Is the patient's home free of loose throw rugs in walkways, pet beds, electrical cords, etc?   yes      Grab bars in the bathroom? yes      Handrails on the stairs?   yes      Adequate lighting?   yes  Depression Screen PHQ 2/9 Scores 11/12/2019 01/12/2019  PHQ - 2 Score 0 0  PHQ- 9 Score - 3     Cognitive Function   6CIT Screen 11/12/2019  What Year? 0 points  What month? 0 points  What time? 0 points  Count back from 20 0 points  Months in reverse 0 points  Repeat phrase 0 points  Total Score 0    Immunization History  Administered Date(s) Administered  . Moderna SARS-COVID-2 Vaccination 08/12/2019  . Pneumococcal Conjugate-13 04/28/2019    Qualifies for Shingles Vaccine?Discussed and patient will check with pharmacy for coverage.  Patient education handout provided   Screening Tests Health Maintenance  Topic Date Due  . INFLUENZA VACCINE  03/13/2020  . PNA vac Low Risk Adult (2 of 2 - PPSV23) 04/27/2020  . DEXA SCAN  Discontinued  . TETANUS/TDAP  Discontinued    Cancer Screenings: Lung: Low Dose CT Chest recommended if Age 36-80 years, 30 pack-year currently smoking OR have quit w/in 15years. Patient does not qualify. Breast:  Up to date on Mammogram? Yes   Up to date of Bone Density/Dexa? Yes Colorectal: No longer indicated     Plan:  I have personally reviewed and addressed the Medicare Annual Wellness questionnaire and have noted the following in the patient's chart:  A. Medical and social history B. Use of alcohol, tobacco or illicit drugs  C. Current medications and  supplements D. Functional ability and status E.  Nutritional status F.  Physical activity G. Advance directives H. List of other physicians I.  Hospitalizations, surgeries, and ER visits in previous 12 months J.  [place] such as hearing and vision if needed, cognitive and depression L. Referrals, records requested, and appointments- none   In addition, I have reviewed and discussed with patient certain preventive protocols, quality metrics, and best practice recommendations. A written personalized care plan for preventive services as well as general preventive health recommendations were provided to patient.   Signed,  Denman George, LPN  Nurse Health Advisor   Nurse Notes: See telephone note for concerns

## 2019-11-12 NOTE — Telephone Encounter (Signed)
Patient states that she did.  In [place] it looks like orders were put in for vaccine on 08/29/19 but I dont see lot information or any other vaccine information

## 2019-11-12 NOTE — Patient Instructions (Signed)
Deanna Rivera , Thank you for taking time to come for your Medicare Wellness Visit. I appreciate your ongoing commitment to your health goals. Please review the following plan we discussed and let me know if I can assist you in the future.   Screening recommendations/referrals: Colorectal Screening: No longer indicated  Mammogram: No longer indicated  Bone Density:  No longer indicated   Vision and Dental Exams: Recommended annual ophthalmology exams for early detection of glaucoma and other disorders of the eye Recommended annual dental exams for proper oral hygiene  Vaccinations: Influenza vaccine: completed 04/29/19 Pneumococcal vaccine: up to dater; last 04/28/19 Tdap vaccine: up to date Shingles vaccine:  You may receive this vaccine at your local pharmacy. (see handout)   Advanced directives: Please bring a copy of your POA (Power of Attorney) and/or Living Will to your next appointment.  Goals: Recommend to drink at least 6-8 8oz glasses of water per day and consume a balanced diet rich in fresh fruits and vegetables.   Next appointment: Please schedule your Annual Wellness Visit with your Nurse Health Advisor in one year.  Preventive Care 84 Years and Older, Female Preventive care refers to lifestyle choices and visits with your health care provider that can promote health and wellness. What does preventive care include?  A yearly physical exam. This is also called an annual well check.  Dental exams once or twice a year.  Routine eye exams. Ask your health care provider how often you should have your eyes checked.  Personal lifestyle choices, including:  Daily care of your teeth and gums.  Regular physical activity.  Eating a healthy diet.  Avoiding tobacco and drug use.  Limiting alcohol use.  Practicing safe sex.  Taking low-dose aspirin every day if recommended by your health care provider.  Taking vitamin and mineral supplements as recommended by your health  care provider. What happens during an annual well check? The services and screenings done by your health care provider during your annual well check will depend on your age, overall health, lifestyle risk factors, and family history of disease. Counseling  Your health care provider may ask you questions about your:  Alcohol use.  Tobacco use.  Drug use.  Emotional well-being.  Home and relationship well-being.  Sexual activity.  Eating habits.  History of falls.  Memory and ability to understand (cognition).  Work and work Statistician.  Reproductive health. Screening  You may have the following tests or measurements:  Height, weight, and BMI.  Blood pressure.  Lipid and cholesterol levels. These may be checked every 5 years, or more frequently if you are over 69 years old.  Skin check.  Lung cancer screening. You may have this screening every year starting at age 84 if you have a 30-pack-year history of smoking and currently smoke or have quit within the past 15 years.  Fecal occult blood test (FOBT) of the stool. You may have this test every year starting at age 84.  Flexible sigmoidoscopy or colonoscopy. You may have a sigmoidoscopy every 5 years or a colonoscopy every 10 years starting at age 25.  Hepatitis C blood test.  Hepatitis B blood test.  Sexually transmitted disease (STD) testing.  Diabetes screening. This is done by checking your blood sugar (glucose) after you have not eaten for a while (fasting). You may have this done every 1-3 years.  Bone density scan. This is done to screen for osteoporosis. You may have this done starting at age 84.  Mammogram.  This may be done every 1-2 years. Talk to your health care provider about how often you should have regular mammograms. Talk with your health care provider about your test results, treatment options, and if necessary, the need for more tests. Vaccines  Your health care provider may recommend certain  vaccines, such as:  Influenza vaccine. This is recommended every year.  Tetanus, diphtheria, and acellular pertussis (Tdap, Td) vaccine. You may need a Td booster every 10 years.  Zoster vaccine. You may need this after age 84.  Pneumococcal 13-valent conjugate (PCV13) vaccine. One dose is recommended after age 84.  Pneumococcal polysaccharide (PPSV23) vaccine. One dose is recommended after age 84. Talk to your health care provider about which screenings and vaccines you need and how often you need them. This information is not intended to replace advice given to you by your health care provider. Make sure you discuss any questions you have with your health care provider. Document Released: 08/26/2015 Document Revised: 04/18/2016 Document Reviewed: 05/31/2015 Elsevier Interactive Patient Education  2017 [employer] Prevention in the Home Falls can cause injuries. They can happen to people of all ages. There are many things you can do to make your home safe and to help prevent falls. What can I do on the outside of my home?  Regularly fix the edges of walkways and driveways and fix any cracks.  Remove anything that might make you trip as you walk through a door, such as a raised step or threshold.  Trim any bushes or trees on the path to your home.  Use bright outdoor lighting.  Clear any walking paths of anything that might make someone trip, such as rocks or tools.  Regularly check to see if handrails are loose or broken. Make sure that both sides of any steps have handrails.  Any raised decks and porches should have guardrails on the edges.  Have any leaves, snow, or ice cleared regularly.  Use sand or salt on walking paths during winter.  Clean up any spills in your garage right away. This includes oil or grease spills. What can I do in the bathroom?  Use night lights.  Install grab bars by the toilet and in the tub and shower. Do not use towel bars as grab  bars.  Use non-skid mats or decals in the tub or shower.  If you need to sit down in the shower, use a plastic, non-slip stool.  Keep the floor dry. Clean up any water that spills on the floor as soon as it happens.  Remove soap buildup in the tub or shower regularly.  Attach bath mats securely with double-sided non-slip rug tape.  Do not have throw rugs and other things on the floor that can make you trip. What can I do in the bedroom?  Use night lights.  Make sure that you have a light by your bed that is easy to reach.  Do not use any sheets or blankets that are too big for your bed. They should not hang down onto the floor.  Have a firm chair that has side arms. You can use this for support while you get dressed.  Do not have throw rugs and other things on the floor that can make you trip. What can I do in the kitchen?  Clean up any spills right away.  Avoid walking on wet floors.  Keep items that you use a lot in easy-to-reach places.  If you need to reach something  above you, use a strong step stool that has a grab bar.  Keep electrical cords out of the way.  Do not use floor polish or wax that makes floors slippery. If you must use wax, use non-skid floor wax.  Do not have throw rugs and other things on the floor that can make you trip. What can I do with my stairs?  Do not leave any items on the stairs.  Make sure that there are handrails on both sides of the stairs and use them. Fix handrails that are broken or loose. Make sure that handrails are as long as the stairways.  Check any carpeting to make sure that it is firmly attached to the stairs. Fix any carpet that is loose or worn.  Avoid having throw rugs at the top or bottom of the stairs. If you do have throw rugs, attach them to the floor with carpet tape.  Make sure that you have a light switch at the top of the stairs and the bottom of the stairs. If you do not have them, ask someone to add them for  you. What else can I do to help prevent falls?  Wear shoes that:  Do not have high heels.  Have rubber bottoms.  Are comfortable and fit you well.  Are closed at the toe. Do not wear sandals.  If you use a stepladder:  Make sure that it is fully opened. Do not climb a closed stepladder.  Make sure that both sides of the stepladder are locked into place.  Ask someone to hold it for you, if possible.  Clearly mark and make sure that you can see:  Any grab bars or handrails.  First and last steps.  Where the edge of each step is.  Use tools that help you move around (mobility aids) if they are needed. These include:  Canes.  Walkers.  Scooters.  Crutches.  Turn on the lights when you go into a dark area. Replace any light bulbs as soon as they burn out.  Set up your furniture so you have a clear path. Avoid moving your furniture around.  If any of your floors are uneven, fix them.  If there are any pets around you, be aware of where they are.  Review your medicines with your doctor. Some medicines can make you feel dizzy. This can increase your chance of falling. Ask your doctor what other things that you can do to help prevent falls. This information is not intended to replace advice given to you by your health care provider. Make sure you discuss any questions you have with your health care provider. Document Released: 05/26/2009 Document Revised: 01/05/2016 Document Reviewed: 09/03/2014 Elsevier Interactive Patient Education  2017 [employer].

## 2019-11-12 NOTE — Telephone Encounter (Signed)
I just reviewed her AWV. Only shows one moderna  covid vaccine in dec 2020. Has she had her second one?  Thanks,  [facility]

## 2019-11-16 NOTE — Telephone Encounter (Signed)
She may use the medications or see a urologist. And her GFR is excellent. Her kidneys work fine.

## 2019-11-17 ENCOUNTER — Other Ambulatory Visit: Payer: Self-pay

## 2019-11-17 DIAGNOSIS — N3281 Overactive bladder: Secondary | ICD-10-CM

## 2019-11-17 NOTE — Telephone Encounter (Signed)
Patient is requesting referral to urology. Referral placed

## 2019-12-29 ENCOUNTER — Telehealth: Payer: Self-pay | Admitting: Internal Medicine

## 2019-12-29 ENCOUNTER — Telehealth: Payer: Self-pay | Admitting: Family Medicine

## 2019-12-29 DIAGNOSIS — M1711 Unilateral primary osteoarthritis, right knee: Secondary | ICD-10-CM

## 2019-12-29 NOTE — Telephone Encounter (Signed)
[facility] is calling requesting an Echo, but there is no active request. Please advise.

## 2019-12-29 NOTE — Telephone Encounter (Signed)
OK for referral? Please advise 

## 2019-12-29 NOTE — Telephone Encounter (Signed)
Patient would like to be referred to rheumatologist. Please advise.

## 2019-12-29 NOTE — Telephone Encounter (Signed)
Returned call to pt.  She has been made aware that her last echo was 04/2019 and Dr. Rayann Heman said will repeat in 1 year. Pt began to tell me that she has some swelling in her feet.  She denies wt gain.  Advised her I would send to a triage RN and have them call back to access.  Pt was very appreciative of that.

## 2020-01-01 NOTE — Telephone Encounter (Signed)
Please refer for PMR> thanks.

## 2020-01-06 NOTE — Addendum Note (Signed)
Addended by: Thomes Cake on: 01/06/2020 09:28 AM   Modules accepted: Orders

## 2020-01-12 ENCOUNTER — Telehealth: Payer: Self-pay | Admitting: Family Medicine

## 2020-01-12 ENCOUNTER — Other Ambulatory Visit: Payer: Self-pay

## 2020-01-12 DIAGNOSIS — M353 Polymyalgia rheumatica: Secondary | ICD-10-CM

## 2020-01-12 NOTE — Telephone Encounter (Signed)
Patient is calling in asking if she can get a referral to Rheumatology.

## 2020-01-12 NOTE — Telephone Encounter (Signed)
Ok for referral? Previous referral was denied, patient chose to continue prednisone treatment.

## 2020-01-12 NOTE — Telephone Encounter (Signed)
Sorry looks like there was a miscommunication.  Please refer to rheumatology for the dianosis of polymyaglia rheumatica (PMR). Looks like she got a referral to PMR physical medicine and rehab which is incorrect.   Thanks.

## 2020-01-12 NOTE — Telephone Encounter (Signed)
Referral placed.

## 2020-01-17 NOTE — Progress Notes (Signed)
Cardiology Office Note Date:  01/19/2020  Patient ID:  Deanna Rivera, DOB 05-27-1933, MRN [id number] PCP:  [name], MD  Cardiologist:  Dr. Edison Simon EP: Dr. Rayann Heman    Chief Complaint: 84mo f/u  History of Present Illness: Deanna Rivera is a 86 y.o. female with history of HTN, VHD w/Mod AS and MR, PMR, persistent AFib, SSSx w/PPM.  She comes in today to be seen for Dr. Rayann Heman, last seen by him 07/23/2019, she was doing well, worried about her husbands recent dx of metastatic colon ca.  No changes were made, though Dr. Rayann Heman mentioned consideration to programm VVIR if she remains in AF.  All in all seems like she is doing ok.  She seems less then happy with communication with the office, response time, communication.  We spent a bit of time on this today. She is upset that she was told she could not have an eco done when she wanted it, says that she historically has never gon a full year without updated echos.  She denies any CP, palpitations or cardiac awareness. When discussing her PPM programming and digoxin Rx she mentions that historically her Afib made her feel absolutely terrible and her current regime has been the best for her and is thankfully unaware of her AF. She She is worried about her BP.  Of late has noted some headaches and generally times feeling poorly hat have correlated with very high BP readings, some as high as 170/120 that she has taken an extra either 1/2 or whole metoprolol for.  This is not daily, but has started to happen intermittently. She took an extra 1/2 tab last night and her usual dose this AM.  She is only taking 1/2 tab of her HCTZ, she says has been that way "forever", and that she spends her whole day urinating with 1/2 tab and can't imagine being able to take more.  She notes that her legs tend to look red and a bit swollen.  No pain, no symptoms of claudication.  No wounds.  She walks with the aid of a walker 2/2 gait instabilities with her PMR  and inclusion body myositis in 2009 She has had falls, not terribly often, perhaps 10 in a year.  She denies any bleeding or signs of bleeding with her pradaxa   Device information BSCi dual chamber PPM, implanted 10/16/2011, gen change 10/20/2018   Past Medical History:  Diagnosis Date  . Cataract   . Hypertension   . Moderate aortic stenosis   . Moderate mitral regurgitation   . Persistent atrial fibrillation ([place])   . PMR (polymyalgia rheumatica) ([facility])   . Second degree AV block   . Sick sinus syndrome ([place])   . Thyroid disease     Past Surgical History:  Procedure Laterality Date  . ABDOMINAL HYSTERECTOMY    . CESAREAN SECTION    . CHOLECYSTECTOMY    . PACEMAKER GENERATOR CHANGE  11/20/2018   [employer] Accolade MRI PPM implanted in [place]  . PACEMAKER IMPLANT    . PILONIDAL CYST EXCISION      Current Outpatient Medications  Medication Sig Dispense Refill  . dabigatran (PRADAXA) 150 MG CAPS capsule Take 1 capsule (150 mg total) by mouth every 12 (twelve) hours. 180 capsule 3  . digoxin (LANOXIN) 0.25 MG tablet TAKE 1 TABLET BY MOUTH  DAILY 90 tablet 3  . hydrochlorothiazide (HYDRODIURIL) 25 MG tablet TAKE 1 TABLET BY MOUTH  DAILY (Patient taking differently: 12.5 mg. ) 90  tablet 3  . levothyroxine (SYNTHROID) 112 MCG tablet Take 1 tablet (112 mcg total) by mouth daily. 90 tablet 3  . Metoprolol Succinate 50 MG CS24 Take 50 mg by mouth daily. 90 capsule 2  . predniSONE (DELTASONE) 1 MG tablet Take 1 tablet (1 mg total) by mouth daily. 90 tablet 3   No current facility-administered medications for this visit.    Allergies:   Monosodium glutamate, Epinephrine, Erythromycin, Shellfish allergy, Sulfamethoxazole-trimethoprim, and Tetracyclines & related   Social History:  The patient  reports that she has never smoked. She has never used smokeless tobacco. She reports that she does not drink alcohol or use drugs.   Family History:  The patient's family history  includes Cirrhosis (age of onset: 42) in her father; Early death (age of onset: 82) in her mother; Hypertension in her brother; Lung cancer (age of onset: 89) in her brother; Scleroderma in her daughter.  ROS:  Please see the history of present illness.    All other systems are reviewed and otherwise negative.   PHYSICAL EXAM:  VS:  BP 140/62   Pulse 60   Ht 5\' 3"  (1.6 m)   Wt 130 lb (59 kg)   BMI 23.03 kg/m  BMI: Body mass index is 23.03 kg/m. Well nourished, well developed, in no acute distress  HEENT: normocephalic, atraumatic  Neck: no JVD, carotid bruits or masses Cardiac:  RRR; soft Sm, no rubs, or gallops Lungs:  CTA b/l, no wheezing, rhonchi or rales  Abd: soft, nontender MS: no deformity or atrophy Ext: trace if any edema is noted.  She has some reddish coloration to her skin, though not erythematous/warm or look of infection.  She has varicosities, and superficial/spider veins, much more L>R, no calf tenderness Skin: warm and dry, no rash Neuro:  No gross deficits appreciated Psych: euthymic mood, full affect  PPM site is stable, no tethering or discomfort, no skin changes   EKG:  Not done today  PPM interrogation done today and reviewed by myself:  Battery and lead measurements are good 100% AF 85%VP   04/16/2019: TTE IMPRESSIONS  1. The left ventricle has normal systolic function with an ejection  fraction of 60-65%. The cavity size was normal. There is mildly increased  left ventricular wall thickness. Left ventricular diastolic function could  not be evaluated secondary to atrial  fibrillation.  2. The right ventricle has normal systolic function. The cavity was  normal. There is no increase in right ventricular wall thickness.  3. Left atrial size was moderately dilated.  4. The mitral valve is abnormal. Mild thickening of the mitral valve  leaflet. Mitral valve regurgitation is mild to moderate by color flow  Doppler.  5. The tricuspid valve is  abnormal.  6. The aortic valve is tricuspid. Mild calcification of the aortic valve.  Aortic valve regurgitation is trivial by color flow Doppler. Mild-moderate  stenosis of the aortic valve.  7. The aorta is normal unless otherwise noted.    Recent Labs: 10/27/2019: ALT 30; BUN 14; Creatinine, Ser 0.33; Hemoglobin 13.6; Platelets 270.0; Potassium 4.1; Sodium 137; TSH 1.23  03/19/2019: Cholesterol 188; HDL 56.60; LDL Cholesterol 105; Total CHOL/HDL Ratio 3; Triglycerides 134.0; VLDL 26.8   CrCl cannot be calculated (Patient's most recent lab result is older than the maximum 21 days allowed.).   Wt Readings from Last 3 Encounters:  01/19/20 130 lb (59 kg)  10/23/19 133 lb (60.3 kg)  07/23/19 133 lb (60.3 kg)  Other studies reviewed: Additional studies/records reviewed today include: summarized above  ASSESSMENT AND PLAN:  1. PPM     Intact function, no programming changes  She is very leary about any changes to her regime At her next visit if again still in AF (and suspect she will be), will plan VVIR  2. Persistent AFib      CHA2DS2Vasc is 4, on pradaxa, appropriately dosed     100% burden  She is on dig 0.25mg  daily Again, she is reluctant abut making changes, suggested reducing to 0.125mg  daily, for now, will start by getting a dig level  3. HTN     She reports intermittent markedly hypertensive readings      She will take an additional metoprolol daily for BP >160/100 She is asked to keep a daily BP log making note of any additional medicine taken Have her see the BP clinic/[facility] with her daily BP log and home cuff   Disposition: F/u with remotes Q 20mo and in clinic 48mo, sooner if needed    Current medicines are reviewed at length with the patient today.  The patient did not have any concerns regarding medicines.  Venetia Night, PA-C 01/19/2020 4:42 PM     [address] [place] [address] [phone] (office)    [phone] (fax)

## 2020-01-19 ENCOUNTER — Ambulatory Visit (INDEPENDENT_AMBULATORY_CARE_PROVIDER_SITE_OTHER): Payer: Medicare Other | Admitting: Physician Assistant

## 2020-01-19 ENCOUNTER — Other Ambulatory Visit: Payer: Self-pay

## 2020-01-19 VITALS — BP 140/62 | HR 60 | Ht 63.0 in | Wt 130.0 lb

## 2020-01-19 DIAGNOSIS — I10 Essential (primary) hypertension: Secondary | ICD-10-CM | POA: Diagnosis not present

## 2020-01-19 DIAGNOSIS — I495 Sick sinus syndrome: Secondary | ICD-10-CM

## 2020-01-19 DIAGNOSIS — I4811 Longstanding persistent atrial fibrillation: Secondary | ICD-10-CM | POA: Diagnosis not present

## 2020-01-19 DIAGNOSIS — Z79899 Other long term (current) drug therapy: Secondary | ICD-10-CM | POA: Diagnosis not present

## 2020-01-19 DIAGNOSIS — Z950 Presence of cardiac pacemaker: Secondary | ICD-10-CM

## 2020-01-19 NOTE — Patient Instructions (Addendum)
Medication Instructions:   YOU MAY CONTINUE TAKING AN EXTRA TABLET OF METOPROLOL DAILY FOR BLOOD PRESSURE 160/100  *If you need a refill on your cardiac medications before your next appointment, please call your pharmacy*   Lab Work: NEED LAB DIGOXIN DRAWN  FOR MEDICATION MANAGEMENT   If you have labs (blood work) drawn today and your tests are completely normal, you will receive your results only by: Marland Kitchen MyChart Message (if you have MyChart) OR . A paper copy in the mail If you have any lab test that is abnormal or we need to change your treatment, we will call you to review the results.   Testing/Procedures: NONE ORDERED  TODAY   Follow-Up:    At [facility], you and your health needs are our priority.  As part of our continuing mission to provide you with exceptional heart care, we have created designated Provider Care Teams.  These Care Teams include your primary Cardiologist (physician) and Advanced Practice Providers (APPs -  Physician Assistants and Nurse Practitioners) who all work together to provide you with the care you need, when you need it.  We recommend signing up for the patient portal called "MyChart".  Sign up information is provided on this After Visit Summary.  MyChart is used to connect with patients for Virtual Visits (Telemedicine).  Patients are able to view lab/test results, encounter notes, upcoming appointments, etc.  Non-urgent messages can be sent to your provider as well.   To learn more about what you can do with MyChart, go to [email or web address].    Your next appointment:    6 month(s)  The format for your next appointment:   In Person  Provider:   You may see Dr. Rayann Heman or one of the following Advanced Practice Providers on your designated Care Team:    Chanetta Marshall, NP  Tommye Standard, PA-C  Legrand Como "[name]" [place], [place]  Other Instructions:  PLEASE WEAR SUPPORT STOCKINGS TO HELP WITH SWELLING IN LEGS   ELEVATE YOUR FEET YOUR LEGS WHILE  SEATING . KEEP A DAILY LOG OF BLOOD PRESSURES

## 2020-01-26 ENCOUNTER — Encounter: Payer: Self-pay | Admitting: Family Medicine

## 2020-01-26 ENCOUNTER — Other Ambulatory Visit: Payer: Self-pay

## 2020-01-26 ENCOUNTER — Ambulatory Visit (INDEPENDENT_AMBULATORY_CARE_PROVIDER_SITE_OTHER): Payer: Medicare Other | Admitting: Family Medicine

## 2020-01-26 VITALS — BP 122/70 | HR 60 | Temp 97.3°F | Ht 63.0 in

## 2020-01-26 DIAGNOSIS — R2681 Unsteadiness on feet: Secondary | ICD-10-CM

## 2020-01-26 DIAGNOSIS — Z72820 Sleep deprivation: Secondary | ICD-10-CM

## 2020-01-26 DIAGNOSIS — I10 Essential (primary) hypertension: Secondary | ICD-10-CM | POA: Diagnosis not present

## 2020-01-26 MED ORDER — HYDROCHLOROTHIAZIDE 25 MG PO TABS: 12.5000 mg | ORAL_TABLET | Freq: Every day | ORAL | Status: DC

## 2020-01-26 NOTE — Patient Instructions (Signed)
Please return in 6 months for lab work and recheck.   You may try otc melatonin nightly to see if that helps you sleep. But taking a nap in the daytime would be fine as well.   If you have any questions or concerns, please don't hesitate to send me a message via MyChart or call the office at [phone]. Thank you for visiting with [place] today! It's our pleasure caring for you.

## 2020-01-26 NOTE — Progress Notes (Signed)
Subjective  CC:  Chief Complaint  Patient presents with  . Hypertension  . Fatigue    HPI: Deanna Rivera is a 86 y.o. female who presents to the office today to address the problems listed above in the chief complaint.  Hypertension f/u: Control is improved. Pt reports she is doing well. taking medications as instructed, no medication side effects noted, no TIAs, no chest pain on exertion, no dyspnea on exertion, no swelling of ankles. Reviewed recent cardiology notes and finding. She denies adverse effects from his BP medications. Compliance with medication is good.   Poor sleep and unsteady gait. Chronic. Improved some with PT.   Assessment  1. Benign essential hypertension   2. Poor sleep   3. Unsteady gait      Plan    Hypertension f/u: BP control is well controlled. Continue same meds  Poor sleep: discussed etiology and age relationship to changes in sleep. Can try melatonin but reassured overall. Can nap during day if needed.  Gait: multifactorial. Did well with PT. Caution for fall prevention.  Education regarding management of these chronic disease states was given. Management strategies discussed on successive visits include dietary and exercise recommendations, goals of achieving and maintaining IBW, and lifestyle modifications aiming for adequate sleep and minimizing stressors.   Follow up: 6 months for labs/cpe/fu  No orders of the defined types were placed in this encounter.  Meds ordered this encounter  Medications  . hydrochlorothiazide (HYDRODIURIL) 25 MG tablet    Sig: Take 0.5 tablets (12.5 mg total) by mouth daily.    Requesting 1 year supply      BP Readings from Last 3 Encounters:  01/26/20 122/70  01/19/20 140/62  10/23/19 126/81   Wt Readings from Last 3 Encounters:  01/19/20 130 lb (59 kg)  10/23/19 133 lb (60.3 kg)  07/23/19 133 lb (60.3 kg)    Lab Results  Component Value Date   CHOL 188 03/19/2019   Lab Results  Component Value  Date   HDL 56.60 03/19/2019   Lab Results  Component Value Date   LDLCALC 105 (H) 03/19/2019   Lab Results  Component Value Date   TRIG 134.0 03/19/2019   Lab Results  Component Value Date   CHOLHDL 3 03/19/2019   No results found for: LDLDIRECT Lab Results  Component Value Date   CREATININE 0.33 (L) 10/27/2019   BUN 14 10/27/2019   NA 137 10/27/2019   K 4.1 10/27/2019   CL 99 10/27/2019   CO2 31 10/27/2019    The ASCVD Risk score (Goff DC Jr., et al., 2013) failed to calculate for the following reasons:   The 2013 ASCVD risk score is only valid for ages 50 to 7  I reviewed the patients updated PMH, FH, and SocHx.    Patient Active Problem List   Diagnosis Date Noted  . SSS (sick sinus syndrome) ([place]) 02/11/2019    Priority: High  . Status post placement of cardiac pacemaker 07/08/2018    Priority: High  . 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 Nontoxic multinodular goiter 09/08/2015    Priority: High  . Current use of long term anticoagulation 05/16/2015    Priority: High  . (HFpEF) heart failure with preserved ejection fraction ([place]) 09/13/2014    Priority: High  . Pacemaker 11/09/2011    Priority: High  . Impaired fasting glucose 05/23/2009    Priority: High  . Long term current use of systemic steroids 03/30/2008  Priority: High  . Polymyalgia rheumatica ([place]) 03/30/2008    Priority: High  . Benign essential hypertension 05/05/2007    Priority: High  . Persistent atrial fibrillation ([place]) 11/06/2006    Priority: High  . Pure hypercholesterolemia 11/06/2006    Priority: High  . Hypothyroidism due to Hashimoto's thyroiditis, Rx Levothyroxine 06/25/2005    Priority: High  . Primary osteoarthritis of right knee 10/23/2019    Priority: Medium  . Nonrheumatic mitral valve regurgitation 09/16/2015    Priority: Medium  . Recurrent UTI 03/14/2015    Priority: Medium  . Neuralgia, postherpetic 07/02/2019    Priority: Low  . OAB  (overactive bladder) 07/02/2019    Priority: Low  . Constipation 01/18/2019    Priority: Low  . Shellfish allergy 11/22/2015  . Synovial cyst of popliteal space 11/22/2008    Allergies: Monosodium glutamate, Epinephrine, Erythromycin, Shellfish allergy, Sulfamethoxazole-trimethoprim, and Tetracyclines & related  Social History: Patient  reports that she has never smoked. She has never used smokeless tobacco. She reports that she does not drink alcohol and does not use drugs.  Current Meds  Medication Sig  . dabigatran (PRADAXA) 150 MG CAPS capsule Take 1 capsule (150 mg total) by mouth every 12 (twelve) hours.  . digoxin (LANOXIN) 0.25 MG tablet TAKE 1 TABLET BY MOUTH  DAILY  . hydrochlorothiazide (HYDRODIURIL) 25 MG tablet Take 0.5 tablets (12.5 mg total) by mouth daily.  Marland Kitchen levothyroxine (SYNTHROID) 112 MCG tablet Take 1 tablet (112 mcg total) by mouth daily.  . Metoprolol Succinate 50 MG CS24 Take 50 mg by mouth daily.  . predniSONE (DELTASONE) 1 MG tablet Take 1 tablet (1 mg total) by mouth daily.  . [DISCONTINUED] hydrochlorothiazide (HYDRODIURIL) 25 MG tablet TAKE 1 TABLET BY MOUTH  DAILY (Patient taking differently: 12.5 mg. )    Review of Systems: Cardiovascular: negative for chest pain, palpitations, leg swelling, orthopnea Respiratory: negative for SOB, wheezing or persistent cough Gastrointestinal: negative for abdominal pain Genitourinary: negative for dysuria or gross hematuria  Objective  Vitals: BP 122/70 (BP Location: Left Arm, Patient Position: Sitting, Cuff Size: Normal)   Pulse 60   Temp (!) 97.3 F (36.3 C) (Temporal)   Ht 5\' 3"  (1.6 m)   SpO2 97%   BMI 23.03 kg/m  General: no acute distress  Psych:  Alert and oriented, normal mood and affect Cor: rrr Lungs: cta   Commons side effects, risks, benefits, and alternatives for medications and treatment plan prescribed today were discussed, and the patient expressed understanding of the given instructions.  Patient is instructed to call or message via MyChart if he/she has any questions or concerns regarding our treatment plan. No barriers to understanding were identified. We discussed Red Flag symptoms and signs in detail. Patient expressed understanding regarding what to do in case of urgent or emergency type symptoms.   Medication list was reconciled, printed and provided to the patient in AVS. Patient instructions and summary information was reviewed with the patient as documented in the AVS. This note was prepared with assistance of Dragon voice recognition software. Occasional wrong-word or sound-a-like substitutions may have occurred due to the inherent limitations of voice recognition software  This visit occurred during the SARS-CoV-2 public health emergency.  Safety protocols were in place, including screening questions prior to the visit, additional usage of staff PPE, and extensive cleaning of exam room while observing appropriate contact time as indicated for disinfecting solutions.

## 2020-01-29 ENCOUNTER — Other Ambulatory Visit: Payer: Self-pay | Admitting: Physician Assistant

## 2020-01-29 ENCOUNTER — Other Ambulatory Visit: Payer: Self-pay

## 2020-01-29 ENCOUNTER — Ambulatory Visit (INDEPENDENT_AMBULATORY_CARE_PROVIDER_SITE_OTHER): Payer: Medicare Other | Admitting: Pharmacist

## 2020-01-29 ENCOUNTER — Other Ambulatory Visit: Payer: Self-pay | Admitting: Family Medicine

## 2020-01-29 VITALS — BP 110/70 | HR 62

## 2020-01-29 DIAGNOSIS — I10 Essential (primary) hypertension: Secondary | ICD-10-CM | POA: Diagnosis not present

## 2020-01-29 NOTE — Patient Instructions (Addendum)
It was nice meeting you today!  Continue taking your hydrochlorothiazide 25mg  once daily and your metoprolol succinate 50 mg once daily  Continue to check your blood pressure at home  Give me to a call to schedule an appointment to review your blood pressure readings  Karren Cobble, PharmD, BCACP, [facility] [address] [place], [place] [address] Phone: [phone]; Fax: [phone] 01/29/2020 3:13 PM

## 2020-01-29 NOTE — Progress Notes (Signed)
Patient ID: Deanna Rivera                 DOB: 07/18/1933                      MRN: [id number]     HPI: Deanna Rivera is a 86 y.o. female referred by Tommye Standard, PA-C to HTN clinic. PMH is significant for HTN, VHD w/Mod AS and MR, PMR, persistent AFib, SSSx w/PPM.  At last visit with [name] on 01/19/20, she was worried about her BP. BP in clinic was elevated at 140/62. She had noted some headaches and general poor feeling that correlated with very high BP readings (some as high as 170/120). She had been taking extra metoprolol for her elevated BP intermittently (not daily). Additionally, she was only taking half a tablet of her HCTZ. She said that she had been taking it that way "forever", and that she spends her whole day urinating with 1/2 tab and can't imagine being able to take more. At the conclusion of that visit, she was instructed to keep a BP log and report to HTN clinic for further HTN management. She was also instructed to take an extra dose of metoprolol succinate 50mg  for BP >160/100.   She saw her PCP on 01/26/20. BP was controlled at 122/70. No medication changes were made.  She presents today confused about her reason for appointment.  She said she was told to not take her digoxin this morning so the pharmacist can check her digoxin level.  Advised that she was here for a HTN consult but she does have a digoxin level lab test ordered.  Patient says she takes her medications in the morning and often has high blood pressure.  But when asked, she remarked that she only tests her BP ~3x a week and does not write it down.  She says she can feel when it is high and it goes down once her husband starts rubbing her feet.  Husband has a cancer diagnosis and repeated twice in room that he was "a ticking time bomb."  She says she takes another half of metoprolol when her BP is high and this helps.    Has recently moved into an independent assisted living facility and does not care for the food.  She  says a nutritionist works there but they add too much salt to her food.  Requests clonidine for her blood pressure spikes.  Current HTN meds: HCTZ 12.5 mg daily, metoprolol succinate 50mg  daily Previously tried: N/A BP goal: <130/80 mmHg  Family History: The patient's family history includes Cirrhosis (age of onset: 51) in her father; Early death (age of onset: 32) in her mother; Hypertension in her brother; Lung cancer (age of onset: 73) in her brother; Scleroderma in her daughter.  Social History: The patient reports that she has never smoked. She has never used smokeless tobacco. She reports that she does not drink alcohol or use drugs.   Home BP readings: unknown at this time  Wt Readings from Last 3 Encounters:  01/19/20 130 lb (59 kg)  10/23/19 133 lb (60.3 kg)  07/23/19 133 lb (60.3 kg)   BP Readings from Last 3 Encounters:  01/26/20 122/70  01/19/20 140/62  10/23/19 126/81   Pulse Readings from Last 3 Encounters:  01/26/20 60  01/19/20 60  10/23/19 61    Renal function: CrCl cannot be calculated (Patient's most recent lab result is older than the maximum 21 days allowed.).  Past Medical History:  Diagnosis Date  . Cataract   . Hypertension   . Moderate aortic stenosis   . Moderate mitral regurgitation   . Persistent atrial fibrillation ([place])   . PMR (polymyalgia rheumatica) ([facility])   . Second degree AV block   . Sick sinus syndrome ([place])   . Thyroid disease     Current Outpatient Medications on File Prior to Visit  Medication Sig Dispense Refill  . dabigatran (PRADAXA) 150 MG CAPS capsule Take 1 capsule (150 mg total) by mouth every 12 (twelve) hours. 180 capsule 3  . digoxin (LANOXIN) 0.25 MG tablet TAKE 1 TABLET BY MOUTH  DAILY 90 tablet 3  . hydrochlorothiazide (HYDRODIURIL) 25 MG tablet Take 0.5 tablets (12.5 mg total) by mouth daily.    Marland Kitchen levothyroxine (SYNTHROID) 112 MCG tablet Take 1 tablet (112 mcg total) by mouth daily. 90 tablet 3  . Metoprolol  Succinate 50 MG CS24 Take 50 mg by mouth daily. 90 capsule 2  . predniSONE (DELTASONE) 1 MG tablet Take 1 tablet (1 mg total) by mouth daily. 90 tablet 3   No current facility-administered medications on file prior to visit.    Allergies  Allergen Reactions  . Monosodium Glutamate Other (See Comments)    other  . Epinephrine   . Erythromycin   . Shellfish Allergy Other (See Comments)  . Sulfamethoxazole-Trimethoprim   . Tetracyclines & Related      Assessment/Plan:  1. Hypertension - Patient blood pressure in room today 110/70 (checked twice) which is at goal of <130/80.  Patient says her BP is high at home but did not bring her machine nor does she write down her values.  She is likely stressed due to husbands cancer diagnosis and frequent medical visits.  Advised patient I was hesitant to start another medication such as clonidine without evidence of hypertension.  Recommended patient schedule follow up appointment with blood pressure monitor so we could review results and make decision from there.  Patient voiced understanding and will call to schedule.  Karren Cobble, PharmD, BCACP, [facility] [address] [place], [place] [address] Phone: [phone]; Fax: [phone] 01/29/2020 4:42 PM

## 2020-01-30 LAB — DIGOXIN LEVEL: Digoxin, Serum: 0.8 ng/mL (ref 0.5–0.9)

## 2020-02-01 NOTE — Telephone Encounter (Signed)
Please review

## 2020-02-05 ENCOUNTER — Telehealth: Payer: Self-pay

## 2020-02-05 MED ORDER — DIGOXIN 125 MCG PO TABS: 0.1250 mg | ORAL_TABLET | Freq: Every day | ORAL | 1 refills | Status: DC

## 2020-02-05 MED ORDER — DIGOXIN 125 MCG PO TABS: 0.1250 mg | ORAL_TABLET | Freq: Every day | ORAL | 0 refills | Status: DC

## 2020-02-05 NOTE — Progress Notes (Signed)
We can recheck a trough (no digoxin that am) in 7-10 days, but otherwise should be stable to keep her 6 month follow up barring symptoms.   Legrand Como [address] [place], [place]

## 2020-02-05 NOTE — Telephone Encounter (Signed)
-----   Message from Shirley Friar, PA-C sent at 02/01/2020  9:40 AM EDT ----- Digoxin level OK, but would like to keep less than <0.8. [name] had recommended reducing to 0.125 mg daily which would be appropriate.    Can we confirm if patient had taken her digoxin the day she had this labwork drawn?  Would continue to recommend decreasing digoxin to 0.125 mg daily.   Legrand Como [address] [place], [place]  02/01/2020 9:39 AM

## 2020-02-05 NOTE — Telephone Encounter (Signed)
Pt is aware and agreeable to the dose change. I sent in a 14 day supply to her local pharmacy and a maintenance dose to [facility] per the patients request.

## 2020-02-05 NOTE — Progress Notes (Signed)
Some insurances may not cover two doses of the same medication, And I've not seen someone chronically alternating doses like that and discussed with pharmacy who have not either.   Would recommend she either take 0.125 mg daily or 0.25 mg every OTHER day.

## 2020-02-05 NOTE — Addendum Note (Signed)
Addended by: Campbell Riches on: 02/05/2020 08:33 AM   Modules accepted: Orders

## 2020-02-05 NOTE — Progress Notes (Signed)
Just another digoxin level without her having taken it that morning.    Thank you!

## 2020-02-08 ENCOUNTER — Telehealth: Payer: Self-pay | Admitting: Family Medicine

## 2020-02-08 NOTE — Telephone Encounter (Signed)
Chief Complaint Double Vision Reason for Call Symptomatic / Request for Health Information Initial Comment Caller was napping outside and woke up and her eyes were bouncing and she could not focus. Caller states last night while reading she had double vision. Translation No Nurse Assessment Nurse: Enrigue Catena Date/Time Eilene Ghazi Time): 02/07/2020 3:05:31 PM Confirm and document reason for call. If symptomatic, describe symptoms. ---Caller was napping outside and woke up and her eyes were bouncing around. She was having some double vision last night, but was not having it today. No eye pain or headache. She has had migraines in the past with ocular involvement but this is different. No fever. Has the patient had close contact with a person known or suspected to have the novel coronavirus illness OR traveled / lives in area with major community spread (including international travel) in the last 14 days from the onset of symptoms? * If Asymptomatic, screen for exposure and travel within the last 14 days. ---No Does the patient have any new or worsening symptoms? ---Yes Will a triage be completed? ---Yes Related visit to physician within the last 2 weeks? ---N/A Does the PT have any chronic conditions? (i.e. diabetes, asthma, this includes High risk factors for pregnancy, etc.) ---Yes List chronic conditions. ---HTN Is this a behavioral health or substance abuse call? ---No Guidelines Guideline Title Affirmed Question Affirmed Notes Nurse Date/Time (Eastern Time) Vision Loss or Change Flashes of light (Exception: brief from pressing on the eyeball) Enrigue Catena 02/07/2020 3:08:44 PMPLEASE NOTE: All timestamps contained within this report are represented as [place] Standard Time. CONFIDENTIALTY NOTICE: This fax transmission is intended only for the addressee. It contains information that is legally privileged, confidential or otherwise protected from use or disclosure. If you  are not the intended recipient, you are strictly prohibited from reviewing, disclosing, copying using or disseminating any of this information or taking any action in reliance on or regarding this information. If you have received this fax in error, please notify us immediately by telephone so that we can arrange for its return to [place]. Phone: [phone], Toll-Free: [phone], Fax: [phone] Page: 2 of 2 Call Id: [id number] [place]. Time Eilene Ghazi Time) Disposition Final User 02/07/2020 3:16:06 PM See HCP within 4 Hours (or PCP triage) Yes Baron Sane Disagree/Comply Comply Caller Understands Yes PreDisposition Call Doctor Care Advice Given Per Guideline SEE HCP WITHIN 4 HOURS (OR PCP TRIAGE): * IF OFFICE WILL BE CLOSED AND NO PCP (PRIMARY CARE PROVIDER) SECOND-LEVEL TRIAGE: You need to be seen within the next 3 or 4 hours. A nearby Urgent Care Center [facility]) is often a good source of care. Another choice is to go to the ED. Go sooner if you become worse. ANOTHER ADULT SHOULD DRIVE: * It is better and safer if another adult drives instead of you. CALL BACK IF: * Blurred vision becomes worse. CARE ADVICE given per Vision Loss or Change (Adult) guideline. Referrals GO TO FACILITY UNDECIDED

## 2020-02-08 NOTE — Telephone Encounter (Signed)
FYI

## 2020-02-10 ENCOUNTER — Ambulatory Visit (INDEPENDENT_AMBULATORY_CARE_PROVIDER_SITE_OTHER): Payer: Medicare Other | Admitting: *Deleted

## 2020-02-10 DIAGNOSIS — I495 Sick sinus syndrome: Secondary | ICD-10-CM | POA: Diagnosis not present

## 2020-02-10 LAB — CUP PACEART REMOTE DEVICE CHECK
Battery Remaining Longevity: 126 mo
Battery Remaining Percentage: 100 %
Brady Statistic RA Percent Paced: 0 %
Brady Statistic RV Percent Paced: 88 %
Date Time Interrogation Session: 20210630064100
Implantable Lead Implant Date: 20130305
Implantable Lead Implant Date: 20130305
Implantable Lead Location: 753859
Implantable Lead Location: 753860
Implantable Lead Model: 4469
Implantable Lead Model: 4470
Implantable Lead Serial Number: 546229
Implantable Lead Serial Number: 712016
Implantable Pulse Generator Implant Date: 20200409
Lead Channel Impedance Value: 448 Ohm
Lead Channel Impedance Value: 537 Ohm
Lead Channel Pacing Threshold Amplitude: 0.6 V
Lead Channel Pacing Threshold Pulse Width: 0.4 ms
Lead Channel Setting Pacing Amplitude: 1.1 V
Lead Channel Setting Pacing Amplitude: 2.5 V
Lead Channel Setting Pacing Pulse Width: 0.4 ms
Lead Channel Setting Sensing Sensitivity: 0.6 mV
Pulse Gen Serial Number: 859007

## 2020-02-11 NOTE — Progress Notes (Signed)
Remote pacemaker transmission.   

## 2020-02-18 ENCOUNTER — Telehealth: Payer: Self-pay | Admitting: Family Medicine

## 2020-02-18 NOTE — Telephone Encounter (Signed)
Noted  

## 2020-02-18 NOTE — Telephone Encounter (Signed)
Patient said the bite has gotten better, does not want appointment.   Nurse Assessment Nurse: Laqueta Due, RN, [employer] (Eastern Time): 02/17/2020 8:03:01 PM Confirm and document reason for call. If symptomatic, describe symptoms. ---pt has insect bite and there is a red line about 1/2 inch in length. no fever. Has the patient had close contact with a person known or suspected to have the novel coronavirus illness OR traveled / lives in area with major community spread (including international travel) in the last 14 days from the onset of symptoms? * If Asymptomatic, screen for exposure and travel within the last 14 days. ---No Does the patient have any new or worsening symptoms? ---Yes Will a triage be completed? ---Yes Related visit to physician within the last 2 weeks? ---N/A Does the PT have any chronic conditions? (i.e. diabetes, asthma, this includes High risk factors for pregnancy, etc.) ---Unknown Is this a behavioral health or substance abuse call? ---No Guidelines Guideline Title Affirmed Question Affirmed Notes Nurse Date/Time (Eastern Time) Insect Bite Bite starts to look bad (e.g., blister, purplish skin, ulcer) (Exception: just swelling or small red bump) Laqueta Due, RN, [employer] 02/17/2020 8:04:47 PM Disp. Time Eilene Ghazi Time) Disposition Final User 02/17/2020 8:09:00 PM SEE PCP WITHIN 3 DAYS Yes [place], RN, AmberPLEASE NOTE: All timestamps contained within this report are represented as [place] Standard Time. CONFIDENTIALTY NOTICE: This fax transmission is intended only for the addressee. It contains information that is legally privileged, confidential or otherwise protected from use or disclosure. If you are not the intended recipient, you are strictly prohibited from reviewing, disclosing, copying using or disseminating any of this information or taking any action in reliance on or regarding this information. If you have received this fax in error, please notify us  immediately by telephone so that we can arrange for its return to [place]. Phone: [phone], Toll-Free: [phone], Fax: [phone] Page: 2 of 2 Call Id: [id number] [place] Disagree/Comply Comply Caller Understands Yes PreDisposition Did not know what to do Care Advice Given Per Guideline SEE PCP WITHIN 3 DAYS: * You need to be seen within 2 or 3 days. ANTIBIOTIC OINTMENT: * Apply an antibiotic ointment (OTC). * Do this 4 times per day. CALL BACK IF: * Fever occurs * You become worse. CARE ADVICE given per Insect Bite (Adult) guideline. Comments User: Letta Moynahan, RN Date/Time Eilene Ghazi Time): 02/17/2020 8:03:58 PM noticed red spot on arm yesterday. Referrals REFERRED TO PCP OFFICE

## 2020-02-26 ENCOUNTER — Telehealth: Payer: Self-pay | Admitting: Internal Medicine

## 2020-02-26 DIAGNOSIS — I340 Nonrheumatic mitral (valve) insufficiency: Secondary | ICD-10-CM

## 2020-02-26 DIAGNOSIS — I10 Essential (primary) hypertension: Secondary | ICD-10-CM

## 2020-02-26 NOTE — Telephone Encounter (Signed)
Returned call to Pt.  Advised had had Pt's medications reviewed and that prednisone would be ok to take with her current medications.  Pt became somewhat aggressive in tone and states she is not taking any additional prednisone and "none of my doctors listen to me.  I need to have an echo to check on my heart"  Advised Pt had an echo 04/2019.  Advised echo at that time was ok.  Pt states "I had an echo every 6 months in [place].  Who do I need to talk to to get an echo ordered?"   Advised would send message to Dr. Rayann Heman to see if he would like to order an echo.  Advised would call Pt back.

## 2020-02-26 NOTE — Telephone Encounter (Signed)
New Message:      Pt says she was just informed that her Muscle Enzymes were high. Her Rheumatologist wants to put her on a high dose of Prednisone. She concerned about taking this along with her other medicine.

## 2020-02-26 NOTE — Telephone Encounter (Signed)
No interactions between prednisone and her current medication list

## 2020-03-06 NOTE — Telephone Encounter (Signed)
I agree with [name].

## 2020-03-07 ENCOUNTER — Telehealth: Payer: Self-pay | Admitting: Internal Medicine

## 2020-03-07 NOTE — Telephone Encounter (Signed)
Returned call to Pt.  Pt wants an Echo.  Advised per Dr. Rayann Heman she can have a yearly Echo.  She will be due in September.  Pt states again she had an echo every 6 months in [place].   Reiterated per Dr. Rayann Heman she can have a yearly echo.  Pt wants to know how to wean down to digoxin 0.125 mg daily.  Advised Pt she did not have to wean down.   She wants to discuss with a pharmacist.

## 2020-03-07 NOTE — Telephone Encounter (Signed)
Deanna Rivera and [place]  Although digoxin 0.125mg  is correct based of renal function, her level was 0.8. Deanna Rivera, you said you wanted to target a goal of <0.8. I was just curious why you wanted to target a lower level instead of the afib goal level of 0.8-2 ?

## 2020-03-07 NOTE — Telephone Encounter (Signed)
Left voicemail for patient to call the office 

## 2020-03-07 NOTE — Telephone Encounter (Signed)
Deanna Rivera is returning Tina's call.

## 2020-03-07 NOTE — Telephone Encounter (Signed)
New Message:    Pt is asking to speak to the doctrt on call for Dr Rayann Heman today. She says she need to discuss changing her dose of Digoxin, She said she also feels like she need an Echo.

## 2020-03-07 NOTE — Telephone Encounter (Addendum)
Spoke to the patient.  Informed her about Dr. Rayann Heman and [name]'s recommendations for a yearly echo and the follow up is due in September.    Asked about Digoxin dosing. Based on pervious documenting she was to reduce her dose from 0.25mg  daily to 0.125mg  daily related to her digxin levels and informed patient.   Patient asked the same questions multiple times and was advised with recommendation.   Patient verbalized understanding.

## 2020-03-08 ENCOUNTER — Encounter: Payer: Self-pay | Admitting: Physician Assistant

## 2020-03-08 ENCOUNTER — Other Ambulatory Visit: Payer: Self-pay

## 2020-03-08 ENCOUNTER — Ambulatory Visit (HOSPITAL_COMMUNITY)
Admission: RE | Admit: 2020-03-08 | Discharge: 2020-03-08 | Disposition: A | Discharge status: Home / Self Care | Payer: Medicare Other | Source: Ambulatory Visit | Attending: Physician Assistant | Admitting: Physician Assistant

## 2020-03-08 ENCOUNTER — Ambulatory Visit (INDEPENDENT_AMBULATORY_CARE_PROVIDER_SITE_OTHER): Payer: Medicare Other | Admitting: Physician Assistant

## 2020-03-08 VITALS — BP 138/78 | HR 60 | Temp 98.0°F | Ht 63.0 in | Wt 131.0 lb

## 2020-03-08 DIAGNOSIS — R1904 Left lower quadrant abdominal swelling, mass and lump: Secondary | ICD-10-CM

## 2020-03-08 DIAGNOSIS — R350 Frequency of micturition: Secondary | ICD-10-CM

## 2020-03-08 DIAGNOSIS — R1030 Lower abdominal pain, unspecified: Secondary | ICD-10-CM | POA: Diagnosis not present

## 2020-03-08 NOTE — Progress Notes (Signed)
Deanna Rivera is a 86 y.o. female here for a new problem.  I acted as a Education administrator for [employer], PA-C [employer], [place]  History of Present Illness:   Chief Complaint  Patient presents with  . Mass in abdomen    HPI  Mass in abdomen Pt noticed a mass in her abdomen, she noticed it a few weeks ago. The mass is a long shape. Mass is on the left lower side of her abdomen. Patient is having more urinary frequency, has been increasing in frequency especially at night. She has been seeing a urologist.  Denies abdominal pain, rectal bleeding, possible injury. Does have significant hx of abdominal surgeries: cholecystectomy, cesarean section x 3, abdominal hysterectomy.  Has noticed a change in stools -- having more straining, denies blood -- has been going on for at least 6 months.   Had a colonoscopy at age 59 y/o -- was told that she had polyps. Did not have follow-up.  Wt Readings from Last 5 Encounters:  03/08/20 131 lb (59.4 kg)  01/19/20 130 lb (59 kg)  10/23/19 133 lb (60.3 kg)  07/23/19 133 lb (60.3 kg)  07/02/19 130 lb (59 kg)     Past Medical History:  Diagnosis Date  . Cataract   . Hypertension   . Moderate aortic stenosis   . Moderate mitral regurgitation   . Persistent atrial fibrillation ([place])   . PMR (polymyalgia rheumatica) (HCC)   . Second degree AV block   . Sick sinus syndrome ([place])   . Thyroid disease      Social History   Tobacco Use  . Smoking status: Never Smoker  . Smokeless tobacco: Never Used  Substance Use Topics  . Alcohol use: No  . Drug use: No    Past Surgical History:  Procedure Laterality Date  . ABDOMINAL HYSTERECTOMY    . CESAREAN SECTION    . CHOLECYSTECTOMY    . PACEMAKER GENERATOR CHANGE  11/20/2018   [employer] Accolade MRI PPM implanted in [place]  . PACEMAKER IMPLANT    . PILONIDAL CYST EXCISION      Family History  Problem Relation Age of Onset  . Early death Mother 65  . Hypertension Brother   .  Cirrhosis Father 58  . Lung cancer Brother 84  . Scleroderma Daughter     Allergies  Allergen Reactions  . Monosodium Glutamate Other (See Comments)    other  . Epinephrine   . Erythromycin   . Shellfish Allergy Other (See Comments)  . Sulfamethoxazole-Trimethoprim   . Tetracyclines & Related     Current Medications:   Current Outpatient Medications:  .  digoxin (LANOXIN) 0.125 MG tablet, Take 1 tablet (0.125 mg total) by mouth daily., Disp: 14 tablet, Rfl: 0 .  digoxin (LANOXIN) 0.125 MG tablet, Take 1 tablet (0.125 mg total) by mouth daily. DOSE CHANGE - Maintenance dose, Disp: 90 tablet, Rfl: 1 .  hydrochlorothiazide (HYDRODIURIL) 25 MG tablet, Take 0.5 tablets (12.5 mg total) by mouth daily., Disp: , Rfl:  .  levothyroxine (SYNTHROID) 112 MCG tablet, TAKE 1 TABLET BY MOUTH  DAILY, Disp: 90 tablet, Rfl: 3 .  Metoprolol Succinate 50 MG CS24, Take 50 mg by mouth daily., Disp: 90 capsule, Rfl: 2 .  PRADAXA 150 MG CAPS capsule, TAKE 1 CAPSULE BY MOUTH  EVERY 12 HOURS, Disp: 180 capsule, Rfl: 3 .  predniSONE (DELTASONE) 1 MG tablet, Take 1 tablet (1 mg total) by mouth daily., Disp: 90 tablet, Rfl: 3  Review of Systems:   ROS  Negative unless otherwise specified per HPI.  Vitals:   Vitals:   03/08/20 1122  BP: (!) 138/78  Pulse: 60  Temp: 98 F (36.7 C)  TempSrc: Temporal  SpO2: 97%  Weight: 131 lb (59.4 kg)  Height: 5\' 3"  (1.6 m)     Body mass index is 23.21 kg/m.  Physical Exam:   Physical Exam Vitals and nursing note reviewed.  Constitutional:      General: She is not in acute distress.    Appearance: She is well-developed. She is not ill-appearing or toxic-appearing.  Cardiovascular:     Rate and Rhythm: Normal rate and regular rhythm.     Pulses: Normal pulses.     Heart sounds: Normal heart sounds, S1 normal and S2 normal.     Comments: No LE edema Pulmonary:     Effort: Pulmonary effort is normal.     Breath sounds: Normal breath sounds.  Abdominal:        Comments: Exam limited as patient states her mobility will not allow her to lie on table; exam was performed with patient standing  Skin:    General: Skin is warm and dry.  Neurological:     Mental Status: She is alert.     GCS: GCS eye subscore is 4. GCS verbal subscore is 5. GCS motor subscore is 6.  Psychiatric:        Speech: Speech normal.        Behavior: Behavior normal. Behavior is cooperative.     Assessment and Plan:   Deanna Rivera was seen today for mass in abdomen.  Diagnoses and all orders for this visit:  Left lower quadrant abdominal mass; Lower abdominal pain Unclear etiology. No evidence of acute abdomen. Possible scar tissue vs hernia vs mass vs other. Will update labs, urine, and also obtain abdominal/pelvic CT for further evaluation. -     CBC with Differential/Platelet; Future -     Comprehensive metabolic panel; Future -     Urinalysis, Routine w reflex microscopic; Future -     Urine Culture; Future -     Urine Culture -     Urinalysis, Routine w reflex microscopic -     Comprehensive metabolic panel -     CBC with Differential/Platelet -     CT Abdomen Pelvis Wo Contrast; Future  Urinary frequency Followed by urology but she did request UA today to r/o infection. Will treat based on urine culture results. -     Urine Culture; Future -     Urine Culture  . Reviewed expectations re: course of current medical issues. . Discussed self-management of symptoms. . Outlined signs and symptoms indicating need for more acute intervention. . Patient verbalized understanding and all questions were answered. . See orders for this visit as documented in the electronic medical record. . Patient received an After-Visit Summary.  CMA or LPN served as scribe during this visit. History, Physical, and Plan performed by medical provider. The above documentation has been reviewed and is accurate and complete.  Inda Coke, PA-C

## 2020-03-08 NOTE — Telephone Encounter (Signed)
Called pt who did not understand why her digoxin dose was being reduced when her level was therapeutic. Advised pt that provider wants level a bit lower due to her advanced age. Discussed that she does not need to taper down on her dose, and that she can just start taking 0.125mg  each day. She verbalized understanding and wanted to know when her level would be rechecked. I do not see that follow up lab work has been scheduled and advised her that I would forward her message to schedule follow up labs when needed.

## 2020-03-08 NOTE — Patient Instructions (Signed)
It was great to see you!  I will be in touch with your labs and urine results.  We are scheduling a CT scan to figure out more information regarding your abdominal mass. We will be in touch with these results.  If at any time you have sudden onset severe pain or other concerns, please do not delay care and go to the ER.  Take care,  Inda Coke PA-C

## 2020-03-09 LAB — URINALYSIS, ROUTINE W REFLEX MICROSCOPIC
Bilirubin Urine: NEGATIVE
Glucose, UA: NEGATIVE
Hgb urine dipstick: NEGATIVE
Ketones, ur: NEGATIVE
Leukocytes,Ua: NEGATIVE
Nitrite: NEGATIVE
Protein, ur: NEGATIVE
Specific Gravity, Urine: 1.013 (ref 1.001–1.03)
pH: 7.5 (ref 5.0–8.0)

## 2020-03-09 LAB — URINE CULTURE
MICRO NUMBER:: 10754593
SPECIMEN QUALITY:: ADEQUATE

## 2020-03-09 LAB — CBC WITH DIFFERENTIAL/PLATELET
Absolute Monocytes: 730 cells/uL (ref 200–950)
Basophils Absolute: 42 cells/uL (ref 0–200)
Basophils Relative: 0.5 %
Eosinophils Absolute: 100 cells/uL (ref 15–500)
Eosinophils Relative: 1.2 %
HCT: 43 % (ref 35.0–45.0)
Hemoglobin: 14.4 g/dL (ref 11.7–15.5)
Lymphs Abs: 3469 cells/uL (ref 850–3900)
MCH: 31.6 pg (ref 27.0–33.0)
MCHC: 33.5 g/dL (ref 32.0–36.0)
MCV: 94.5 fL (ref 80.0–100.0)
MPV: 10.3 fL (ref 7.5–12.5)
Monocytes Relative: 8.8 %
Neutro Abs: 3959 cells/uL (ref 1500–7800)
Neutrophils Relative %: 47.7 %
Platelets: 257 10*3/uL (ref 140–400)
RBC: 4.55 10*6/uL (ref 3.80–5.10)
RDW: 12.5 % (ref 11.0–15.0)
Total Lymphocyte: 41.8 %
WBC: 8.3 10*3/uL (ref 3.8–10.8)

## 2020-03-09 LAB — COMPREHENSIVE METABOLIC PANEL
AG Ratio: 1.2 (calc) (ref 1.0–2.5)
ALT: 31 U/L — ABNORMAL HIGH (ref 6–29)
AST: 36 U/L — ABNORMAL HIGH (ref 10–35)
Albumin: 4.2 g/dL (ref 3.6–5.1)
Alkaline phosphatase (APISO): 56 U/L (ref 37–153)
BUN/Creatinine Ratio: 29 (calc) — ABNORMAL HIGH (ref 6–22)
BUN: 10 mg/dL (ref 7–25)
CO2: 28 mmol/L (ref 20–32)
Calcium: 10.4 mg/dL (ref 8.6–10.4)
Chloride: 97 mmol/L — ABNORMAL LOW (ref 98–110)
Creat: 0.35 mg/dL — ABNORMAL LOW (ref 0.60–0.88)
Globulin: 3.5 g/dL (calc) (ref 1.9–3.7)
Glucose, Bld: 93 mg/dL (ref 65–99)
Potassium: 4.4 mmol/L (ref 3.5–5.3)
Sodium: 135 mmol/L (ref 135–146)
Total Bilirubin: 0.9 mg/dL (ref 0.2–1.2)
Total Protein: 7.7 g/dL (ref 6.1–8.1)

## 2020-03-09 NOTE — Addendum Note (Signed)
Addended by: Willeen Cass A on: 03/09/2020 03:14 PM   Modules accepted: Orders

## 2020-03-09 NOTE — Addendum Note (Signed)
Addended by: Willeen Cass A on: 03/09/2020 03:05 PM   Modules accepted: Orders

## 2020-03-10 ENCOUNTER — Other Ambulatory Visit: Payer: Self-pay

## 2020-03-10 ENCOUNTER — Telehealth: Payer: Self-pay | Admitting: Family Medicine

## 2020-03-10 ENCOUNTER — Telehealth: Payer: Self-pay | Admitting: Internal Medicine

## 2020-03-10 DIAGNOSIS — I5032 Chronic diastolic (congestive) heart failure: Secondary | ICD-10-CM

## 2020-03-10 NOTE — Telephone Encounter (Signed)
Deanna Rivera, pt is calling about CT results please advise.

## 2020-03-10 NOTE — Telephone Encounter (Signed)
Staff message sent to Dr. Morene Rankins to advise Pt contacted [facility] to request her CT results.

## 2020-03-10 NOTE — Telephone Encounter (Signed)
Patient is requesting a referral to a new cardiologist, states she would like another doctor.

## 2020-03-10 NOTE — Telephone Encounter (Signed)
CT scan results (listed in her labs drawn on same day) are as follows: "CT scan did not show any explanation for your abdominal mass, no evidence of hernia. Recommend that we continue to monitor and have you follow-up with [name] if symptoms change/persist."  Per chart review, these were already given to patient with her lab results via telephone on 03/09/20.

## 2020-03-10 NOTE — Telephone Encounter (Signed)
I do not see that our office ordered the CT

## 2020-03-10 NOTE — Telephone Encounter (Signed)
Left message on voicemail to call office.  

## 2020-03-10 NOTE — Telephone Encounter (Signed)
New referral placed.

## 2020-03-10 NOTE — Telephone Encounter (Signed)
Patient is requesting to discuss CT scan completed on 03/08/20.

## 2020-03-11 NOTE — Telephone Encounter (Signed)
Pt returned call. Andy's team was with pt. Told her we would call her back.

## 2020-03-11 NOTE — Telephone Encounter (Signed)
Went over CT results with patient

## 2020-03-17 NOTE — Telephone Encounter (Signed)
Returned patients call  Left message to call office back

## 2020-03-17 NOTE — Telephone Encounter (Signed)
Follow Up:      Pt received her CT results from Dr Gwynneth Munson office. She still have some questions about what was said about the Aorta. She was told by Dr Gwynneth Munson office to contact Dr Jackalyn Lombard office to discuss this part of the CT results.Deanna Rivera

## 2020-03-17 NOTE — Telephone Encounter (Signed)
The patient called office back about CT result all questions were answered and patient verbalized understanding.  Darrell Jewel, RN 03/17/2020

## 2020-04-07 ENCOUNTER — Other Ambulatory Visit: Payer: Self-pay

## 2020-04-07 ENCOUNTER — Telehealth: Payer: Self-pay

## 2020-04-07 DIAGNOSIS — M353 Polymyalgia rheumatica: Secondary | ICD-10-CM

## 2020-04-07 DIAGNOSIS — M1711 Unilateral primary osteoarthritis, right knee: Secondary | ICD-10-CM

## 2020-04-07 NOTE — Telephone Encounter (Signed)
Sent to [facility]

## 2020-04-07 NOTE — Telephone Encounter (Signed)
FYI, new referral placed

## 2020-04-07 NOTE — Telephone Encounter (Signed)
Pt states the Rheumatologist that we referred her to didn't work out and she would like a referral to be sent in to a different Rheumatologist

## 2020-04-14 ENCOUNTER — Other Ambulatory Visit: Payer: Self-pay

## 2020-04-14 ENCOUNTER — Ambulatory Visit (HOSPITAL_COMMUNITY): Payer: Medicare Other | Attending: Cardiology

## 2020-04-14 DIAGNOSIS — I340 Nonrheumatic mitral (valve) insufficiency: Secondary | ICD-10-CM

## 2020-04-14 DIAGNOSIS — I10 Essential (primary) hypertension: Secondary | ICD-10-CM

## 2020-04-14 LAB — ECHOCARDIOGRAM COMPLETE
AR max vel: 1.43 cm2
AV Area VTI: 1.38 cm2
AV Area mean vel: 1.41 cm2
AV Mean grad: 8.5 mmHg
AV Peak grad: 14.1 mmHg
Ao pk vel: 1.88 m/s
Area-P 1/2: 4.32 cm2
MV M vel: 4.89 m/s
MV Peak grad: 95.6 mmHg
Radius: 0.7 cm
S' Lateral: 2.7 cm

## 2020-04-22 ENCOUNTER — Telehealth: Payer: Self-pay | Admitting: Internal Medicine

## 2020-04-22 NOTE — Telephone Encounter (Signed)
Deanna Rivera is calling requesting a nurse call her to discuss her Echo results. Please advise.

## 2020-04-22 NOTE — Telephone Encounter (Signed)
The patient has been notified of the result and verbalized understanding.  All questions (if any) were answered. Darrell Jewel, RN 04/22/2020 10:38 AM

## 2020-05-11 ENCOUNTER — Ambulatory Visit (INDEPENDENT_AMBULATORY_CARE_PROVIDER_SITE_OTHER): Payer: Medicare Other | Admitting: Emergency Medicine

## 2020-05-11 DIAGNOSIS — I495 Sick sinus syndrome: Secondary | ICD-10-CM

## 2020-05-12 LAB — CUP PACEART REMOTE DEVICE CHECK
Battery Remaining Longevity: 126 mo
Battery Remaining Percentage: 100 %
Brady Statistic RA Percent Paced: 0 %
Brady Statistic RV Percent Paced: 84 %
Date Time Interrogation Session: 20210929064100
Implantable Lead Implant Date: 20130305
Implantable Lead Implant Date: 20130305
Implantable Lead Location: 753859
Implantable Lead Location: 753860
Implantable Lead Model: 4469
Implantable Lead Model: 4470
Implantable Lead Serial Number: 546229
Implantable Lead Serial Number: 712016
Implantable Pulse Generator Implant Date: 20200409
Lead Channel Impedance Value: 425 Ohm
Lead Channel Impedance Value: 526 Ohm
Lead Channel Pacing Threshold Amplitude: 0.6 V
Lead Channel Pacing Threshold Pulse Width: 0.4 ms
Lead Channel Setting Pacing Amplitude: 1.2 V
Lead Channel Setting Pacing Amplitude: 2.5 V
Lead Channel Setting Pacing Pulse Width: 0.4 ms
Lead Channel Setting Sensing Sensitivity: 0.6 mV
Pulse Gen Serial Number: 859007

## 2020-05-16 NOTE — Progress Notes (Signed)
Remote pacemaker transmission.   

## 2020-05-24 ENCOUNTER — Telehealth: Payer: Self-pay | Admitting: RHEUMATOLOGY

## 2020-05-24 NOTE — Telephone Encounter (Signed)
All clinical notes from office visits with Dr. Doristine Johns faxed to [facility] at [phone] per patient's request. ROI scanned into patient's chart.  Michaeal Anon Raices, MA I 05/24/20 2:54 PM

## 2020-06-15 ENCOUNTER — Other Ambulatory Visit: Payer: Self-pay | Admitting: Student

## 2020-07-05 ENCOUNTER — Other Ambulatory Visit: Payer: Self-pay

## 2020-07-05 ENCOUNTER — Ambulatory Visit (INDEPENDENT_AMBULATORY_CARE_PROVIDER_SITE_OTHER): Payer: Medicare Other | Admitting: Student

## 2020-07-05 VITALS — BP 112/66 | HR 98 | Ht 64.0 in | Wt 132.0 lb

## 2020-07-05 DIAGNOSIS — Z79899 Other long term (current) drug therapy: Secondary | ICD-10-CM | POA: Diagnosis not present

## 2020-07-05 DIAGNOSIS — I495 Sick sinus syndrome: Secondary | ICD-10-CM

## 2020-07-05 DIAGNOSIS — Z950 Presence of cardiac pacemaker: Secondary | ICD-10-CM

## 2020-07-05 LAB — CUP PACEART INCLINIC DEVICE CHECK
Date Time Interrogation Session: 20211123141507
Implantable Lead Implant Date: 20130305
Implantable Lead Implant Date: 20130305
Implantable Lead Location: 753859
Implantable Lead Location: 753860
Implantable Lead Model: 4469
Implantable Lead Model: 4470
Implantable Lead Serial Number: 546229
Implantable Lead Serial Number: 712016
Implantable Pulse Generator Implant Date: 20200409
Lead Channel Impedance Value: 468 Ohm
Lead Channel Impedance Value: 468 Ohm
Lead Channel Impedance Value: 569 Ohm
Lead Channel Impedance Value: 569 Ohm
Lead Channel Sensing Intrinsic Amplitude: 1.4 mV
Lead Channel Sensing Intrinsic Amplitude: 1.4 mV
Lead Channel Sensing Intrinsic Amplitude: 21 mV
Lead Channel Sensing Intrinsic Amplitude: 21 mV
Lead Channel Setting Pacing Amplitude: 1.1 V
Lead Channel Setting Pacing Amplitude: 2.5 V
Lead Channel Setting Pacing Pulse Width: 0.4 ms
Lead Channel Setting Sensing Sensitivity: 0.6 mV
Pulse Gen Serial Number: 859007

## 2020-07-05 NOTE — Patient Instructions (Signed)
Medication Instructions:  *If you need a refill on your cardiac medications before your next appointment, please call your pharmacy*  Lab Work: Your physician has recommended that you have lab work today: BMET and Digoxin Level  If you have labs (blood work) drawn today and your tests are completely normal, you will receive your results only by: Marland Kitchen MyChart Message (if you have MyChart) OR . A paper copy in the mail If you have any lab test that is abnormal or we need to change your treatment, we will call you to review the results.  Follow-Up: At [facility], you and your health needs are our priority.  As part of our continuing mission to provide you with exceptional heart care, we have created designated Provider Care Teams.  These Care Teams include your primary Cardiologist (physician) and Advanced Practice Providers (APPs -  Physician Assistants and Nurse Practitioners) who all work together to provide you with the care you need, when you need it.  We recommend signing up for the patient portal called "MyChart".  Sign up information is provided on this After Visit Summary.  MyChart is used to connect with patients for Virtual Visits (Telemedicine).  Patients are able to view lab/test results, encounter notes, upcoming appointments, etc.  Non-urgent messages can be sent to your provider as well.   To learn more about what you can do with MyChart, go to [email or web address].    Your next appointment:   Your physician wants you to follow-up in: 6 MONTHS with Oda Kilts, PA-C You will receive a reminder letter in the mail two months in advance. If you don't receive a letter, please call our office to schedule the follow-up appointment.'  Remote monitoring is used to monitor your Pacemaker from home. This monitoring reduces the number of office visits required to check your device to one time per year. It allows [place] to keep an eye on the functioning of your device to ensure it is working  properly. You are scheduled for a device check from home on 08/10/20. You may send your transmission at any time that day. If you have a wireless device, the transmission will be sent automatically. After your physician reviews your transmission, you will receive a postcard with your next transmission date.

## 2020-07-05 NOTE — Progress Notes (Signed)
Electrophysiology Office Note Date: 07/05/2020  ID:  Deanna Rivera, DOB Dec 05, 1932, MRN [id number]  PCP: [name], MD Primary Cardiologist: No primary care provider on file. Electrophysiologist: Thompson Grayer, MD   CC: Pacemaker follow-up  Deanna Rivera is a 86 y.o. female seen today for Thompson Grayer, MD for routine electrophysiology followup.  Since last being seen in our clinic the patient reports doing well. She has fatigue that is worse in the mornings. This is chronic. She has some concerns about her BP as it is today, wondering if it is too low.  We reviewed her Echo and discussed that the indications for surgery in the setting of valvular disease are 1: severity, and 2: symptoms. She is relatively stable and would not be a surgical candidate, which she is aware of.  she denies chest pain, palpitations, dyspnea, PND, orthopnea, nausea, vomiting, dizziness, syncope, edema, weight gain, or early satiety.   Device History: BSCi dual chamber PPM, implanted 10/16/2011, gen change 10/20/2018  Past Medical History:  Diagnosis Date  . Cataract   . Hypertension   . Moderate aortic stenosis   . Moderate mitral regurgitation   . Persistent atrial fibrillation ([place])   . PMR (polymyalgia rheumatica) (HCC)   . Second degree AV block   . Sick sinus syndrome ([place])   . Thyroid disease    Past Surgical History:  Procedure Laterality Date  . ABDOMINAL HYSTERECTOMY    . CESAREAN SECTION    . CHOLECYSTECTOMY    . PACEMAKER GENERATOR CHANGE  11/20/2018   [employer] Accolade MRI PPM implanted in [place]  . PACEMAKER IMPLANT    . PILONIDAL CYST EXCISION      Current Outpatient Medications  Medication Sig Dispense Refill  . digoxin (LANOXIN) 0.125 MG tablet Take 1 tablet (0.125 mg total) by mouth daily. 14 tablet 0  . hydrochlorothiazide (HYDRODIURIL) 25 MG tablet Take 0.5 tablets (12.5 mg total) by mouth daily.    Marland Kitchen levothyroxine (SYNTHROID) 112 MCG tablet TAKE 1 TABLET BY  MOUTH  DAILY 90 tablet 3  . Metoprolol Succinate 50 MG CS24 Take 50 mg by mouth daily. 90 capsule 2  . PRADAXA 150 MG CAPS capsule TAKE 1 CAPSULE BY MOUTH  EVERY 12 HOURS 180 capsule 3  . predniSONE (DELTASONE) 1 MG tablet Take 1 tablet (1 mg total) by mouth daily. 90 tablet 3   No current facility-administered medications for this visit.    Allergies:   Monosodium glutamate, Epinephrine, Erythromycin, Shellfish allergy, Sulfamethoxazole-trimethoprim, and Tetracyclines & related   Social History: Social History   Socioeconomic History  . Marital status: Married    Spouse name: Not on file  . Number of children: Not on file  . Years of education: Not on file  . Highest education level: Not on file  Occupational History  . Occupation: Retired     Comment: Oncologist   Tobacco Use  . Smoking status: Never Smoker  . Smokeless tobacco: Never Used  Substance and Sexual Activity  . Alcohol use: No  . Drug use: No  . Sexual activity: Not Currently    Birth control/protection: Post-menopausal  Other Topics Concern  . Not on file  Social History Narrative   Recently moved from [place]. In independent living with her husband. Daughter died from scleroderma complications. Hx of falls, but last was > 1.5 years ago. 01/18/2019    Social Determinants of Health   Financial Resource Strain:   . Difficulty of Paying Living Expenses: Not on  file  Food Insecurity:   . Worried About Charity fundraiser in the Last Year: Not on file  . Ran Out of Food in the Last Year: Not on file  Transportation Needs:   . Lack of Transportation (Medical): Not on file  . Lack of Transportation (Non-Medical): Not on file  Physical Activity:   . Days of Exercise per Week: Not on file  . Minutes of Exercise per Session: Not on file  Stress:   . Feeling of Stress : Not on file  Social Connections:   . Frequency of Communication with Friends and Family: Not on file  . Frequency of Social  Gatherings with Friends and Family: Not on file  . Attends Religious Services: Not on file  . Active Member of Clubs or Organizations: Not on file  . Attends Archivist Meetings: Not on file  . Marital Status: Not on file  Intimate Partner Violence:   . Fear of Current or Ex-Partner: Not on file  . Emotionally Abused: Not on file  . Physically Abused: Not on file  . Sexually Abused: Not on file    Family History: Family History  Problem Relation Age of Onset  . Early death Mother 59  . Hypertension Brother   . Cirrhosis Father 33  . Lung cancer Brother 60  . Scleroderma Daughter      Review of Systems: All other systems reviewed and are otherwise negative except as noted above.  Physical Exam: Vitals:   07/05/20 1218  BP: 112/66  Pulse: 98  SpO2: 98%  Weight: 132 lb (59.9 kg)  Height: 5\' 4"  (1.626 m)     GEN- The patient is well appearing, alert and oriented x 3 today.   HEENT: normocephalic, atraumatic; sclera clear, conjunctiva pink; hearing intact; oropharynx clear; neck supple  Lungs- Clear to ausculation bilaterally, normal work of breathing.  No wheezes, rales, rhonchi Heart- Regular rate and rhythm, no murmurs, rubs or gallops  GI- soft, non-tender, non-distended, bowel sounds present  Extremities- no clubbing or cyanosis. No edema MS- no significant deformity or atrophy Skin- warm and dry, no rash or lesion; PPM pocket well healed Psych- euthymic mood, full affect Neuro- strength and sensation are intact  PPM Interrogation- reviewed in detail today,  See PACEART report  EKG:  EKG is not ordered today.   Recent Labs: 10/27/2019: TSH 1.23 03/08/2020: ALT 31; BUN 10; Creat 0.35; Hemoglobin 14.4; Platelets 257; Potassium 4.4; Sodium 135   Wt Readings from Last 3 Encounters:  07/05/20 132 lb (59.9 kg)  03/08/20 131 lb (59.4 kg)  01/19/20 130 lb (59 kg)     Other studies Reviewed: Additional studies/ records that were reviewed today include:  Previous EP office notes, Previous remote checks, Most recent labwork.   Assessment and Plan:  1. Symptomatic bradycardia s/p [employer] PPM  Normal PPM function See Claudia Desanctis Art report No changes today  2. Persistent AF On pradaxa for CHA2DS2VASC of at least 5.   100% burden -> Permanent Continue digoxin 0.125 mg daily. Level today.  She refused programming to VVIR, as it would only increase her battery life and not affect her symptoms. She says "10.5 years of battery is more than enough" Echo 04/14/2020 LVEF 65-70% Moderate MR (stable)  3. HTN BP has been stable.  RTC 6 months. Sooner with symptoms.   Current medicines are reviewed at length with the patient today.   The patient does not have concerns regarding her medicines.  The following  changes were made today:  none  Labs/ tests ordered today include:  Orders Placed This Encounter  Procedures  . Basic Metabolic Panel (BMET)  . Digoxin level     Disposition:   Follow up with EP APP in 6 Months    Signed, Annamaria Helling  07/05/2020 2:18 PM  [address] [place] [address] [phone] (office) [phone] (fax)

## 2020-07-06 ENCOUNTER — Telehealth: Payer: Self-pay

## 2020-07-06 DIAGNOSIS — Z79899 Other long term (current) drug therapy: Secondary | ICD-10-CM

## 2020-07-06 LAB — BASIC METABOLIC PANEL
BUN/Creatinine Ratio: 35 — ABNORMAL HIGH (ref 12–28)
BUN: 14 mg/dL (ref 8–27)
CO2: 28 mmol/L (ref 20–29)
Calcium: 10.1 mg/dL (ref 8.7–10.3)
Chloride: 98 mmol/L (ref 96–106)
Creatinine, Ser: 0.4 mg/dL — ABNORMAL LOW (ref 0.57–1.00)
GFR calc Af Amer: 110 mL/min/{1.73_m2} (ref >59)
GFR calc non Af Amer: 95 mL/min/{1.73_m2} (ref >59)
Glucose: 94 mg/dL (ref 65–99)
Potassium: 4.4 mmol/L (ref 3.5–5.2)
Sodium: 136 mmol/L (ref 134–144)

## 2020-07-06 LAB — DIGOXIN LEVEL: Digoxin, Serum: 1 ng/mL — ABNORMAL HIGH (ref 0.5–0.9)

## 2020-07-06 NOTE — Telephone Encounter (Signed)
-----   Message from Shirley Friar, PA-C sent at 07/06/2020  9:54 AM EST ----- She had taken her digoxin am of this lab. For completeness, we should repeat a digoxin level next week in the morning with her NOT taking her digoxin that morning, please.  Legrand Como [address]" [place], PA-C  07/06/2020 9:53 AM

## 2020-07-06 NOTE — Telephone Encounter (Signed)
The patient has been notified of the result and verbalized understanding.  All questions (if any) were answered.  Patient plans to come in for redraw on Tues 11/30. She is aware to HOLD her digoxin that morning until after she has her labs drawn.  Wilma Flavin, RN 07/06/2020 10:05 AM

## 2020-07-12 ENCOUNTER — Other Ambulatory Visit: Payer: Self-pay

## 2020-07-12 ENCOUNTER — Other Ambulatory Visit: Payer: Medicare Other | Admitting: *Deleted

## 2020-07-12 DIAGNOSIS — Z79899 Other long term (current) drug therapy: Secondary | ICD-10-CM

## 2020-07-13 LAB — DIGOXIN LEVEL: Digoxin, Serum: 0.5 ng/mL (ref 0.5–0.9)

## 2020-07-14 ENCOUNTER — Telehealth: Payer: Self-pay

## 2020-07-14 NOTE — Telephone Encounter (Signed)
Patient returning Tay's call connected call to [facility]

## 2020-07-14 NOTE — Telephone Encounter (Signed)
Per DPR left detailed message on patients voicemail with stable labs and no changes. I Instructed patient to call back if she has any questions or concerns.

## 2020-07-14 NOTE — Telephone Encounter (Signed)
-----   Message from Shirley Friar, PA-C sent at 07/13/2020  7:52 AM EST ----- Stable trough level. Please let her know no change is needed in her digoxin at this time. Legrand Como [address]" [place], PA-C  07/13/2020 7:52 AM

## 2020-07-28 ENCOUNTER — Other Ambulatory Visit: Payer: Self-pay

## 2020-07-28 ENCOUNTER — Encounter: Payer: Self-pay | Admitting: Family Medicine

## 2020-07-28 ENCOUNTER — Ambulatory Visit (INDEPENDENT_AMBULATORY_CARE_PROVIDER_SITE_OTHER): Payer: Medicare Other | Admitting: Family Medicine

## 2020-07-28 VITALS — BP 116/70 | HR 60 | Temp 97.5°F | Ht 64.0 in | Wt 132.0 lb

## 2020-07-28 DIAGNOSIS — E038 Other specified hypothyroidism: Secondary | ICD-10-CM

## 2020-07-28 DIAGNOSIS — Z7901 Long term (current) use of anticoagulants: Secondary | ICD-10-CM

## 2020-07-28 DIAGNOSIS — Z1212 Encounter for screening for malignant neoplasm of rectum: Secondary | ICD-10-CM

## 2020-07-28 DIAGNOSIS — N3281 Overactive bladder: Secondary | ICD-10-CM

## 2020-07-28 DIAGNOSIS — I10 Essential (primary) hypertension: Secondary | ICD-10-CM

## 2020-07-28 DIAGNOSIS — I5032 Chronic diastolic (congestive) heart failure: Secondary | ICD-10-CM | POA: Diagnosis not present

## 2020-07-28 DIAGNOSIS — M353 Polymyalgia rheumatica: Secondary | ICD-10-CM

## 2020-07-28 DIAGNOSIS — Z1211 Encounter for screening for malignant neoplasm of colon: Secondary | ICD-10-CM

## 2020-07-28 DIAGNOSIS — Z7952 Long term (current) use of systemic steroids: Secondary | ICD-10-CM

## 2020-07-28 DIAGNOSIS — Z780 Asymptomatic menopausal state: Secondary | ICD-10-CM

## 2020-07-28 DIAGNOSIS — R7301 Impaired fasting glucose: Secondary | ICD-10-CM | POA: Diagnosis not present

## 2020-07-28 DIAGNOSIS — E063 Autoimmune thyroiditis: Secondary | ICD-10-CM

## 2020-07-28 NOTE — Progress Notes (Signed)
Subjective  Chief Complaint  Patient presents with  . Annual Exam    Non-fasting, requesting urine testing today  . Constipation    OTC medication with no relief, BM once every 3 days  . Urinary Incontinence    Mostly at night, states she is getting up 3-4 times nightly, half the time she is able to make it to the toilet     HPI: Deanna Rivera is a 86 y.o. female who presents to [facility] [place] today for a Female Wellness Visit. She also has the concerns and/or needs as listed above in the chief complaint. These will be addressed in addition to the Health Maintenance Visit.   Wellness Visit: annual visit with health maintenance review and exam without Pap   HM: age 63 and reportedly refused CRC screens in the past but now would like to do cologuard. Her husband was dxd with colon cancer. Due for dexa: had refused in past. High risk due to age and chronic pred. Defers pneumovax today because they will be traveling this weekend. Will return to receive it Chronic disease f/u and/or acute problem visit: (deemed necessary to be done in addition to the wellness visit):  HTN: fairly well controlled. Feels well w/o cp  CHF and PAF: reviewed recent cardiology eval. Stable. conitnue with meds  H/o ifg and low thyroid due for recheck. She is chronically fatigued in the am but does better through out the day. No changes .  Unsteady gait w/ h/o myositis or PMR: on very low dose pred chronically. I reviewed rheum notes: pt declines further changes to her medications. Denies pain. High fall risk on anticoagulation.   C/o OAB sxs worsening. Failed ditropan. Seeing urologist. Doesn't want to try other meds now. No irritative sxs but wants urine checked.    Assessment  1. Benign essential hypertension   2. Chronic heart failure with preserved ejection fraction ([place])   3. Hypothyroidism due to Hashimoto's thyroiditis, Rx Levothyroxine   4. Impaired fasting glucose   5.  Current use of long term anticoagulation   6. OAB (overactive bladder)   7. Long term current use of systemic steroids   8. Polymyalgia rheumatica [facility])      Plan  Female Wellness Visit:  Age appropriate Health Maintenance and Prevention measures were discussed with patient. Included topics are cancer screening recommendations, ways to keep healthy (see AVS) including dietary and exercise recommendations, regular eye and dental care, use of seat belts, and avoidance of moderate alcohol use and tobacco use. Discussed CRC screens: pt is adamant that she wants the cologuard. Discussed f/u. dexa ordered. No longer gtting mammograms  BMI: discussed patient's BMI and encouraged positive lifestyle modifications to help get to or maintain a target BMI.  HM needs and immunizations were addressed and ordered. See below for orders. See HM and immunization section for updates.  Routine labs and screening tests ordered including cmp, cbc and lipids where appropriate.  Discussed recommendations regarding Vit D and calcium supplementation (see AVS)  Chronic disease management visit and/or acute problem visit:  HTN is controlled.   Recheck thyroid and sugars. Appears stable  PMR/myositis on chronic pred: pt defers further rheum eval. Continue pred lifelong to avoid adrenal insufficiency  High fall risk. Uses a walker to ambulate.   chf per cards.   Follow up: 6 months for recheck   No orders of the defined types were placed in this encounter.  No orders of the defined types were  placed in this encounter.     Lifestyle: Body mass index is 22.66 kg/m. Wt Readings from Last 3 Encounters:  07/28/20 132 lb (59.9 kg)  07/05/20 132 lb (59.9 kg)  03/08/20 131 lb (59.4 kg)     Patient Active Problem List   Diagnosis Date Noted  . SSS (sick sinus syndrome) ([place]) 02/11/2019    Priority: High  . Status post placement of cardiac pacemaker 07/08/2018    Priority: High  . 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    Priority: High  . Nontoxic multinodular goiter 09/08/2015    Priority: High  . Current use of long term anticoagulation 05/16/2015    Priority: High  . (HFpEF) heart failure with preserved ejection fraction ([place]) 09/13/2014    Priority: High  . Pacemaker 11/09/2011    Priority: High  . Impaired fasting glucose 05/23/2009    Priority: High  . Long term current use of systemic steroids 03/30/2008    Priority: High  . Polymyalgia rheumatica ([place]) 03/30/2008    Priority: High  . Benign essential hypertension 05/05/2007    Priority: High  . Persistent atrial fibrillation ([place]) 11/06/2006    Priority: High  . Pure hypercholesterolemia 11/06/2006    Priority: High  . Hypothyroidism due to Hashimoto's thyroiditis, Rx Levothyroxine 06/25/2005    Priority: High  . Primary osteoarthritis of right knee 10/23/2019    Priority: Medium  . Nonrheumatic mitral valve regurgitation 09/16/2015    Priority: Medium  . Recurrent UTI 03/14/2015    Priority: Medium  . Neuralgia, postherpetic 07/02/2019    Priority: Low  . OAB (overactive bladder) 07/02/2019    Priority: Low  . Constipation 01/18/2019    Priority: Low  . Shellfish allergy 11/22/2015  . Synovial cyst of popliteal space 11/22/2008   Health Maintenance  Topic Date Due  . PNA vac Low Risk Adult (2 of 2 - PPSV23) 04/27/2020  . INFLUENZA VACCINE  Completed  . COVID-19 Vaccine  Completed  . DEXA SCAN  Discontinued  . TETANUS/TDAP  Discontinued   Immunization History  Administered Date(s) Administered  . Fluad Quad(high Dose 65+) 05/13/2020  . Moderna Sars-Covid-2 Vaccination 08/12/2019, 09/09/2019, 06/14/2020  . Pneumococcal Conjugate-13 04/28/2019   We updated and reviewed the patient's past history in detail and it is documented below. Allergies: Patient is allergic to monosodium glutamate, epinephrine, erythromycin, shellfish allergy, sulfamethoxazole-trimethoprim, and tetracyclines & related. Past  Medical History Patient  has a past medical history of Cataract, Hypertension, Moderate aortic stenosis, Moderate mitral regurgitation, Persistent atrial fibrillation ([place]), PMR (polymyalgia rheumatica) ([place]), Second degree AV block, Sick sinus syndrome ([place]), and Thyroid disease. Past Surgical History Patient  has a past surgical history that includes Cholecystectomy; Cesarean section; Abdominal hysterectomy; Pilonidal cyst excision; Pacemaker generator change (11/20/2018); and PACEMAKER IMPLANT. Family History: Patient family history includes Cirrhosis (age of onset: 27) in her father; Early death (age of onset: 53) in her mother; Hypertension in her brother; Lung cancer (age of onset: 36) in her brother; Scleroderma in her daughter. Social History:  Patient  reports that she has never smoked. She has never used smokeless tobacco. She reports that she does not drink alcohol and does not use drugs.  Review of Systems: Constitutional: negative for fever or malaise Ophthalmic: negative for photophobia, double vision or loss of vision Cardiovascular: negative for chest pain, dyspnea on exertion, or new LE swelling Respiratory: negative for SOB or persistent cough Gastrointestinal: negative for abdominal pain, change in bowel habits or melena Genitourinary: negative for dysuria or  gross hematuria, no abnormal uterine bleeding or disharge Musculoskeletal: negative for new gait disturbance or muscular weakness Integumentary: negative for new or persistent rashes, no breast lumps Neurological: negative for TIA or stroke symptoms Psychiatric: negative for SI or delusions Allergic/Immunologic: negative for hives  Patient Care Team    Relationship Specialty Notifications Start End  [name], MD PCP - General Family Medicine  06/08/19   Thompson Grayer, MD PCP - Electrophysiology Cardiology  07/05/20   Thompson Grayer, MD Consulting Physician Cardiology  06/08/19   Pete Pelt, PA-C  Physician Assistant Orthopedic Surgery  11/12/19     Objective  Vitals: BP 116/70   Pulse 60   Temp (!) 97.5 F (36.4 C) (Temporal)   Ht 5\' 4"  (1.626 m)   Wt 132 lb (59.9 kg)   SpO2 98%   BMI 22.66 kg/m  General:  Well developed, well nourished, no acute distress  Psych:  Alert and orientedx3,normal mood and affect HEENT:  Normocephalic, atraumatic, non-icteric sclera,  supple neck without adenopathy, mass or thyromegaly Cardiovascular:  RRR w/ murmur Respiratory:  Good breath sounds bilaterally, CTAB with normal respiratory effort Gastrointestinal: normal bowel sounds, soft, non-tender, no noted masses. No HSM Ext: w/ o edema   Commons side effects, risks, benefits, and alternatives for medications and treatment plan prescribed today were discussed, and the patient expressed understanding of the given instructions. Patient is instructed to call or message via MyChart if he/she has any questions or concerns regarding our treatment plan. No barriers to understanding were identified. We discussed Red Flag symptoms and signs in detail. Patient expressed understanding regarding what to do in case of urgent or emergency type symptoms.   Medication list was reconciled, printed and provided to the patient in AVS. Patient instructions and summary information was reviewed with the patient as documented in the AVS. This note was prepared with assistance of Dragon voice recognition software. Occasional wrong-word or sound-a-like substitutions may have occurred due to the inherent limitations of voice recognition software  This visit occurred during the SARS-CoV-2 public health emergency.  Safety protocols were in place, including screening questions prior to the visit, additional usage of staff PPE, and extensive cleaning of exam room while observing appropriate contact time as indicated for disinfecting solutions.

## 2020-07-28 NOTE — Patient Instructions (Signed)
Please return in 6 months for hypertension follow up and recheck.  I will release your lab results to you on your MyChart account with further instructions. Please reply with any questions.   I recommend the Cologuard test for your colon cancer screening that is due. I have ordered this test for you. [facility] will soon contact you to verify your insurance, address etc. They will then send you the kit; follow the instructions in the kit and return the kit to [facility]. They will run the test and send the results to me. I will then give you the results. If this test is negative, we recommend repeating a colon cancer screening test in 3 years. If it is positive, I will refer you to a Gastroenterologist so you can get set up for the recommended colonoscopy.  Thank you!  Please start daily otc miralax to help your constipation.  We will call you to get you an appointment for a bone density scan.   If you have any questions or concerns, please don't hesitate to send me a message via MyChart or call the office at [phone]. Thank you for visiting with [place] today! It's our pleasure caring for you.

## 2020-07-29 LAB — CBC WITH DIFFERENTIAL/PLATELET
Absolute Monocytes: 810 cells/uL (ref 200–950)
Basophils Absolute: 27 cells/uL (ref 0–200)
Basophils Relative: 0.3 %
Eosinophils Absolute: 89 cells/uL (ref 15–500)
Eosinophils Relative: 1 %
HCT: 40.7 % (ref 35.0–45.0)
Hemoglobin: 13.7 g/dL (ref 11.7–15.5)
Lymphs Abs: 2884 cells/uL (ref 850–3900)
MCH: 31.7 pg (ref 27.0–33.0)
MCHC: 33.7 g/dL (ref 32.0–36.0)
MCV: 94.2 fL (ref 80.0–100.0)
MPV: 10.2 fL (ref 7.5–12.5)
Monocytes Relative: 9.1 %
Neutro Abs: 5091 cells/uL (ref 1500–7800)
Neutrophils Relative %: 57.2 %
Platelets: 249 10*3/uL (ref 140–400)
RBC: 4.32 10*6/uL (ref 3.80–5.10)
RDW: 12.5 % (ref 11.0–15.0)
Total Lymphocyte: 32.4 %
WBC: 8.9 10*3/uL (ref 3.8–10.8)

## 2020-07-29 LAB — URINALYSIS, ROUTINE W REFLEX MICROSCOPIC
Bilirubin Urine: NEGATIVE
Glucose, UA: NEGATIVE
Hgb urine dipstick: NEGATIVE
Ketones, ur: NEGATIVE
Leukocytes,Ua: NEGATIVE
Nitrite: NEGATIVE
Protein, ur: NEGATIVE
Specific Gravity, Urine: 1.015 (ref 1.001–1.03)
pH: 5.5 (ref 5.0–8.0)

## 2020-07-29 LAB — COMPLETE METABOLIC PANEL WITH GFR
AG Ratio: 1.1 (calc) (ref 1.0–2.5)
ALT: 29 U/L (ref 6–29)
AST: 34 U/L (ref 10–35)
Albumin: 3.9 g/dL (ref 3.6–5.1)
Alkaline phosphatase (APISO): 54 U/L (ref 37–153)
BUN/Creatinine Ratio: 68 (calc) — ABNORMAL HIGH (ref 6–22)
BUN: 23 mg/dL (ref 7–25)
CO2: 28 mmol/L (ref 20–32)
Calcium: 10.5 mg/dL — ABNORMAL HIGH (ref 8.6–10.4)
Chloride: 97 mmol/L — ABNORMAL LOW (ref 98–110)
Creat: 0.34 mg/dL — ABNORMAL LOW (ref 0.60–0.88)
GFR, Est African American: 116 mL/min/{1.73_m2} (ref >=60)
GFR, Est Non African American: 100 mL/min/{1.73_m2} (ref >=60)
Globulin: 3.6 g/dL (calc) (ref 1.9–3.7)
Glucose, Bld: 84 mg/dL (ref 65–99)
Potassium: 4.3 mmol/L (ref 3.5–5.3)
Sodium: 135 mmol/L (ref 135–146)
Total Bilirubin: 0.5 mg/dL (ref 0.2–1.2)
Total Protein: 7.5 g/dL (ref 6.1–8.1)

## 2020-07-29 LAB — LIPID PANEL
Cholesterol: 202 mg/dL — ABNORMAL HIGH (ref <200)
HDL: 66 mg/dL (ref >=50)
LDL Cholesterol (Calc): 117 mg/dL (calc) — ABNORMAL HIGH
Non-HDL Cholesterol (Calc): 136 mg/dL (calc) — ABNORMAL HIGH (ref <130)
Total CHOL/HDL Ratio: 3.1 (calc) (ref <5.0)
Triglycerides: 87 mg/dL (ref <150)

## 2020-07-29 LAB — SEDIMENTATION RATE: Sed Rate: 41 mm/h — ABNORMAL HIGH (ref 0–30)

## 2020-07-29 LAB — HEMOGLOBIN A1C
Hgb A1c MFr Bld: 5.8 % of total Hgb — ABNORMAL HIGH (ref <5.7)
Mean Plasma Glucose: 120 mg/dL
eAG (mmol/L): 6.6 mmol/L

## 2020-07-29 LAB — TSH: TSH: 1.85 mIU/L (ref 0.40–4.50)

## 2020-07-29 NOTE — Progress Notes (Signed)
Please call patient: I have reviewed his/her lab results. All lab results are normal urine is normal as well.

## 2020-08-10 ENCOUNTER — Ambulatory Visit (INDEPENDENT_AMBULATORY_CARE_PROVIDER_SITE_OTHER): Payer: Medicare Other

## 2020-08-10 DIAGNOSIS — I495 Sick sinus syndrome: Secondary | ICD-10-CM | POA: Diagnosis not present

## 2020-08-10 LAB — CUP PACEART REMOTE DEVICE CHECK
Battery Remaining Longevity: 126 mo
Battery Remaining Percentage: 100 %
Brady Statistic RA Percent Paced: 0 %
Brady Statistic RV Percent Paced: 76 %
Date Time Interrogation Session: 20211229070800
Implantable Lead Implant Date: 20130305
Implantable Lead Implant Date: 20130305
Implantable Lead Location: 753859
Implantable Lead Location: 753860
Implantable Lead Model: 4469
Implantable Lead Model: 4470
Implantable Lead Serial Number: 546229
Implantable Lead Serial Number: 712016
Implantable Pulse Generator Implant Date: 20200409
Lead Channel Impedance Value: 431 Ohm
Lead Channel Impedance Value: 515 Ohm
Lead Channel Pacing Threshold Amplitude: 0.7 V
Lead Channel Pacing Threshold Pulse Width: 0.4 ms
Lead Channel Setting Pacing Amplitude: 1.1 V
Lead Channel Setting Pacing Amplitude: 2.5 V
Lead Channel Setting Pacing Pulse Width: 0.4 ms
Lead Channel Setting Sensing Sensitivity: 0.6 mV
Pulse Gen Serial Number: 859007

## 2020-08-17 ENCOUNTER — Other Ambulatory Visit: Payer: Self-pay

## 2020-08-17 DIAGNOSIS — G7241 Inclusion body myositis [IBM]: Secondary | ICD-10-CM

## 2020-08-17 DIAGNOSIS — I5032 Chronic diastolic (congestive) heart failure: Secondary | ICD-10-CM

## 2020-08-17 MED ORDER — DIGOXIN 125 MCG PO TABS: 0.1250 mg | ORAL_TABLET | Freq: Every day | ORAL | 3 refills | Status: DC

## 2020-08-24 ENCOUNTER — Other Ambulatory Visit: Payer: Self-pay | Admitting: Family Medicine

## 2020-08-25 NOTE — Progress Notes (Signed)
Remote pacemaker transmission.   

## 2020-09-09 ENCOUNTER — Telehealth: Payer: Self-pay | Admitting: Internal Medicine

## 2020-09-09 ENCOUNTER — Other Ambulatory Visit: Payer: Self-pay | Admitting: Physician Assistant

## 2020-09-09 DIAGNOSIS — M25561 Pain in right knee: Secondary | ICD-10-CM

## 2020-09-09 NOTE — Telephone Encounter (Signed)
Pt called to report that she has been having fluctuations in her BP for about a month. She is worried that at random times thought the day she will get a headache and will check her BP and it will be "high" but she does not record the readings and she is not sure how to get the back readings off of her machine.   Yesterday afternoon she developed a headache and her BP was 190/119. She rested and her BP had come down to 145/80.   She denies chest pain, SOB, palpitations.   It seems her PCP Dr. Billey Chang has been managing her BP but she reports that her office has asked her to call Dr. Rayann Heman. I explained to her that with Dr. Rayann Heman being EP we may have to get her set up with Gen cards but she has seen the HTN clinic this past summer 01/2020.   Will forward to [facility] for his review and recommendations. I have asked the pt to keep track of her BP readings by writing them down.

## 2020-09-09 NOTE — Telephone Encounter (Signed)
Pt c/o BP issue: STAT if pt c/o blurred vision, one-sided weakness or slurred speech  1. What are your last 5 BP readings? This morning 145/80 but all night is was running 190/119  2. Are you having any other symptoms (ex. Dizziness, headache, blurred vision, passed out)? Headache   3. What is your BP issue? running up then patient some medication it only gose a little

## 2020-09-09 NOTE — Telephone Encounter (Signed)
Patient has been advised in the past she can take an extra metoprolol when her BP is >160.  Recommend she retest

## 2020-09-09 NOTE — Telephone Encounter (Signed)
Left message for the pt to call our office back.  

## 2020-09-09 NOTE — Telephone Encounter (Signed)
Spoke with the pt and advised her take the added metoprolol if her BP is elevated and has a headache. She will start with a half.   I asked her to check her BP once in the morning and once at night and to record it with the times on paper. I asked her to call us on Monday to follow up with [place] to see how her BP is trending. She will also watch her NA intake and caffeine.   Pt declined an appt next Thursday with the [facility] which is the only day she can make appts... she says she is seeing her Urologist Dr. Gilford Rile that day.

## 2020-09-12 NOTE — Telephone Encounter (Signed)
Pt called to report her BP readings from the weekend..   Friday night 128/75 Saturday 147/71 and 143/87 Sunday 161/92 prior to meds Monday 98/70 and she had not checked it again.   I was offering to make her an appt to follow up in the HTN clinic but she is reporting that she has been having increased fatigue and some increased SOB with exertion.   Pt advised that we will see her tomorrow at 3:45 pm and for her to continue to monoitor her BP and HR and to write them down for [place]... to be sure she is drinking enough fluids.   Pt agreed and will let us know if her facility is unable to bring her by tomorrow morning so we can change the appt if needed.

## 2020-09-12 NOTE — Telephone Encounter (Signed)
    Pt is calling back and would like to speak with RN Lelon Frohlich again.

## 2020-09-13 ENCOUNTER — Other Ambulatory Visit: Payer: Self-pay

## 2020-09-13 ENCOUNTER — Ambulatory Visit (INDEPENDENT_AMBULATORY_CARE_PROVIDER_SITE_OTHER): Payer: Medicare Other | Admitting: Nurse Practitioner

## 2020-09-13 ENCOUNTER — Encounter: Payer: Self-pay | Admitting: Nurse Practitioner

## 2020-09-13 VITALS — BP 106/70 | HR 60 | Ht 64.0 in | Wt 131.0 lb

## 2020-09-13 DIAGNOSIS — I10 Essential (primary) hypertension: Secondary | ICD-10-CM

## 2020-09-13 DIAGNOSIS — Z950 Presence of cardiac pacemaker: Secondary | ICD-10-CM

## 2020-09-13 DIAGNOSIS — Z79899 Other long term (current) drug therapy: Secondary | ICD-10-CM

## 2020-09-13 DIAGNOSIS — I495 Sick sinus syndrome: Secondary | ICD-10-CM | POA: Diagnosis not present

## 2020-09-13 NOTE — Telephone Encounter (Signed)
The patient has an appointment with Truitt Merle, NP this afternoon.

## 2020-09-13 NOTE — Patient Instructions (Addendum)
After Visit Summary:  We will be checking the following labs today - NONE   Medication Instructions:    Continue with your current medicines.   Ok to take only one extra Toprol if needed for BP over 485 systolic.    If you need a refill on your cardiac medications before your next appointment, please call your pharmacy.     Testing/Procedures To Be Arranged:  N/A  Follow-Up:   See Dr. Rayann Heman in one month with your BP diary.     At [facility], you and your health needs are our priority.  As part of our continuing mission to provide you with exceptional heart care, we have created designated Provider Care Teams.  These Care Teams include your primary Cardiologist (physician) and Advanced Practice Providers (APPs -  Physician Assistants and Nurse Practitioners) who all work together to provide you with the care you need, when you need it.  Special Instructions:  . Stay safe, wash your hands for at least 20 seconds and wear a mask when needed.  . It was good to talk with you both today.  . I will see if we can get you both some help on your Pradaxa.    Call the [place] office at [phone] if you have any questions, problems or concerns.

## 2020-09-13 NOTE — Progress Notes (Signed)
CARDIOLOGY OFFICE NOTE  Date:  09/13/2020    Deanna Rivera Date of Birth: [birth year] Medical Record #[id number]  PCP:  [name], MD  Cardiologist:  [facility]   Chief Complaint  Patient presents with  . Follow-up    Work in visit to discuss BP = seen for Dr. Rayann Heman    History of Present Illness: Deanna Rivera is a 87 y.o. female who presents today for a work in visit.  Seen for Dr. Rayann Heman.   She has a history of HTN, VHD w/Mod AS and MR, PMR, persistent AFib, and SSSx w/PPM. She is on Pradaxa for her anticoagulation.   Last seen in November by Oda Kilts, PA - seemed to be doing ok.    Phone call earlier this week - "Pt called to report her BP readings from the weekend.  Friday night 128/75 Saturday 147/71 and 143/87 Sunday 161/92 prior to meds Monday 98/70 and she had not checked it again.  I was offering to make her an appt to follow up in the HTN clinic but she is reporting that she has been having increased fatigue and some increased SOB with exertion.  Pt advised that we will see her tomorrow at 3:45 pm and for her to continue to monoitor her BP and HR and to write them down for [place]... to be sure she is drinking enough fluids. Pt agreed and will let us know if her facility is unable to bring her by tomorrow morning so we can change the appt if needed."  Thus added to this schedule for today.   Comes in today. Here with her husband.  She notes her BP has been quite labile - up as high as 412 systolic and now down to 878 systolic. Had some headache when it was high. It was fine when she saw PCP in December.  She has been taking extra Toprol. Typically feels terrible in the mornings - wonders "why am I alive" but then after moving around and showering - feels normal. They have moved to an independent living - "[facility]". Unclear how much salt she is now getting but probably more than she is used to. As the visit goes on - she gets very upset - she is worried about  the rising cost of Pradaxa now - will be over $600 for a 3 month supply - this may be the entire issue for her.   Past Medical History:  Diagnosis Date  . Cataract   . Hypertension   . Moderate aortic stenosis   . Moderate mitral regurgitation   . Persistent atrial fibrillation ([place])   . PMR (polymyalgia rheumatica) ([facility])   . Second degree AV block   . Sick sinus syndrome ([place])   . Thyroid disease     Past Surgical History:  Procedure Laterality Date  . ABDOMINAL HYSTERECTOMY    . CESAREAN SECTION    . CHOLECYSTECTOMY    . PACEMAKER GENERATOR CHANGE  11/20/2018   [employer] Accolade MRI PPM implanted in [place]  . PACEMAKER IMPLANT    . PILONIDAL CYST EXCISION       Medications: Current Meds  Medication Sig  . digoxin (LANOXIN) 0.125 MG tablet Take 1 tablet (0.125 mg total) by mouth daily.  . hydrochlorothiazide (HYDRODIURIL) 25 MG tablet Take 0.5 tablets (12.5 mg total) by mouth daily.  Marland Kitchen levothyroxine (SYNTHROID) 112 MCG tablet TAKE 1 TABLET BY MOUTH  DAILY  . LUMIGAN 0.01 % SOLN SMARTSIG:In Eye(s)  . Metoprolol  Succinate 50 MG CS24 Take 50 mg by mouth daily.  Marland Kitchen PRADAXA 150 MG CAPS capsule TAKE 1 CAPSULE BY MOUTH  EVERY 12 HOURS  . predniSONE (DELTASONE) 1 MG tablet Take 1 tablet (1 mg total) by mouth daily.     Allergies: Allergies  Allergen Reactions  . Monosodium Glutamate Other (See Comments)    other  . Epinephrine   . Erythromycin   . Shellfish Allergy Other (See Comments)  . Sulfamethoxazole-Trimethoprim   . Tetracyclines & Related     Social History: The patient  reports that she has never smoked. She has never used smokeless tobacco. She reports that she does not drink alcohol and does not use drugs.   Family History: The patient's family history includes Cirrhosis (age of onset: 41) in her father; Early death (age of onset: 59) in her mother; Hypertension in her brother; Lung cancer (age of onset: 31) in her brother; Scleroderma in her  daughter.   Review of Systems: Please see the history of present illness.   All other systems are reviewed and negative.   Physical Exam: VS:  BP 106/70   Pulse 60   Ht 5\' 4"  (1.626 m)   Wt 131 lb (59.4 kg)   SpO2 97%   BMI 22.49 kg/m  .  BMI Body mass index is 22.49 kg/m.  Wt Readings from Last 3 Encounters:  09/13/20 131 lb (59.4 kg)  07/28/20 132 lb (59.9 kg)  07/05/20 132 lb (59.9 kg)   BP is 160/80 by me in both arms.   General: Elderly. Alert and in no acute distress.  Using a walker. She gets rather tearful today.  Cardiac: Irregular irregular rhythm. Rate is fine. Harsh outflow murmur.  No significant edema.  Respiratory:  Lungs are clear to auscultation bilaterally with normal work of breathing.  GI: Soft and nontender.  MS: No deformity or atrophy. Gait and ROM intact.  Skin: Warm and dry. Color is normal.  Neuro:  Strength and sensation are intact and no gross focal deficits noted.  Psych: Alert, appropriate and with normal affect.   LABORATORY DATA:  EKG:  EKG is ordered today.  Personally reviewed by me. This demonstrates V pacing with underlying AF.  Lab Results  Component Value Date   WBC 8.9 07/28/2020   HGB 13.7 07/28/2020   HCT 40.7 07/28/2020   PLT 249 07/28/2020   GLUCOSE 84 07/28/2020   CHOL 202 (H) 07/28/2020   TRIG 87 07/28/2020   HDL 66 07/28/2020   LDLCALC 117 (H) 07/28/2020   ALT 29 07/28/2020   AST 34 07/28/2020   NA 135 07/28/2020   K 4.3 07/28/2020   CL 97 (L) 07/28/2020   CREATININE 0.34 (L) 07/28/2020   BUN 23 07/28/2020   CO2 28 07/28/2020   TSH 1.85 07/28/2020   HGBA1C 5.8 (H) 07/28/2020     BNP (last 3 results) No results for input(s): BNP in the last 8760 hours.  ProBNP (last 3 results) No results for input(s): PROBNP in the last 8760 hours.   Other Studies Reviewed Today:  ECHO IMPRESSIONS 04/2020  1. Left ventricular ejection fraction, by estimation, is 65 to 70%. The  left ventricle has normal function. The  left ventricle has no regional  wall motion abnormalities. There is mild concentric left ventricular  hypertrophy. Left ventricular diastolic  function could not be evaluated. The average left ventricular global  longitudinal strain is -13.1 %.  2. Right ventricular systolic function is normal. The right ventricular  size is normal. There is mildly elevated pulmonary artery systolic  pressure. The estimated right ventricular systolic pressure is 0000000 mmHg.  3. Left atrial size was moderately dilated.  4. Right atrial size was mildly dilated.  5. The mitral valve is normal in structure. Moderate mitral valve  regurgitation. No evidence of mitral stenosis.  6. Tricuspid valve regurgitation is moderate.  7. The aortic valve is normal in structure. Aortic valve regurgitation is  not visualized. Mild aortic valve stenosis.  8. The inferior vena cava is normal in size with greater than 50%  respiratory variability, suggesting right atrial pressure of 3 mmHg.     ASSESSMENT AND PLAN:   1. Reported labile BP - her BP is 160/80 by me in both arms- she is upset - I think this is driving some of the issue - will have her monitor for now - she does not wish to take extra HCTZ due to her incontinence. Ok to use one extra Toprol prn SBP over 180.   2. Underlying PPM - followed by EP  3. Valvular heart disease - favor conservative management.   4. Persistent AF - rate is ok - PPM in place  5. Chronic anticoagulation - has apparently been on Pradaxa for years - she wishes to stay on this - will see if there are any options for her - her husband is apparently on this as well - she has never reportedly tried any other agent - he apparently has. Will see if there is any recourse. If change to Eliquis - would use the 2.5 mg dose since she is over 80 years and is under 60kg.    1. Symptomatic sinus bradycardia and second degree AV block Normal pacemaker function See Pace Art report No changes  today she is not device dependant today Consider programming VVIR if she remains in afib  2. HTN Stable No change required today  3. afib Rate controlled afib burden is 100 %  She is on pradaxa and has labs followed by primary care  4. Mitral regurgitation Mild to moderate by recent echo.  She is very surprised as she has been told that she has severe MR for years.  She was quite anxious about this today.  We discussed this at length.  Repeat echo in a year.  Given her advanced age, ultimately, we may decide to stop following by echo at that time.  Current medicines are reviewed with the patient today.  The patient does not have concerns regarding medicines other than what has been noted above.  The following changes have been made:  See above.  Labs/ tests ordered today include:    Orders Placed This Encounter  Procedures  . EKG 12-Lead     Disposition:   FU with Dr. Rayann Heman and his team in one month.     Patient is agreeable to this plan and will call if any problems develop in the interim.   SignedTruitt Merle, NP  09/13/2020 4:34 PM  [facility] [address] [place], [place]  [address] Phone: [phone] Fax: [phone]

## 2020-09-14 ENCOUNTER — Telehealth: Payer: Self-pay

## 2020-09-14 NOTE — Telephone Encounter (Signed)
**Note De-Identified Talaysia Pinheiro Obfuscation** Demoni Parmar, Deliah Boston, LPN  Burtis Junes, NP Cecille Rubin,  I was on hold with [facility] for 20 mins prior to calling the pt without getting to s/w anyone.   The pt does not have a good understanding of her and her husbands ins plan.  She does not know if they have Part D coverage at all and is unwilling to call to ask why their Pradaxa cost has gone up.   We have no new insurance on them since 2020 and she states that nothing has changed.  I asked if their deductible went up and she does not know.  I requested that she call their ins provider to discuss options but she is insisting that we call [facility] and tell them her husband can only take Pradaxa.   I offered her [employer] pt asst phone number to call as they help pts who are uninsured or underinsured and if the are approved they will receive it free of charge for the reminder of this year.but she started crying loudly and gave the phone to her husband.   I gave him their number and asked that he call to discuss pt asst with them.  I have sent a message to our pharmacist asking what I should do as I am unsure  Thanks, Jeani Hawking        Previous Messages   ----- Message -----  From: Burtis Junes, NP  Sent: 09/14/2020  8:21 AM EST  To: Deliah Boston Asaad Gulley, LPN   That would be great - they were both very upset - she was crying - I think that is why her bp was so high.   lori  ----- Message -----  From: Dennie Fetters, LPN  Sent: 10/19/1015  8:14 AM EST  To: Burtis Junes, NP   Jonathon Bellows,  Since its the beginning of the year Im inclined to think this is related to a deductible they may owe as Pradaxa is a name brand med with a high cost.  I will call them to discuss.  Thanks for letting me know.  Jeani Hawking  ----- Message -----  From: Burtis Junes, NP  Sent: 09/13/2020  4:34 PM EST  To: Deliah Boston Ileana Chalupa, LPN   Jeani Hawking,   Is there anyway you can help with this patient and her husband - he is Alma Friendly -   They are both on Pradaxa - need  appeals to stay on this - cost has escalated to over 600 dollars for 3 months supply.   Is there anything we can do??? They said someone was suppose to call.   lori

## 2020-09-14 NOTE — Telephone Encounter (Signed)
**Note De-Identified Deanna Rivera Obfuscation** After s/w our pharmacist I attempted to get the pts ins information from [facility] but was advised that the last ins info they have for the pt and her husband is from 2020 but did give me the phone number off the back of the card. I called the phone number but it was for ordering Oxygen.  I called [facility] again and was able to s/w someone who transferred my call to the prior authorization dept. I did a Pradaxa tier exception with [name]. Per [name] it will take up to 72 hours for their determination and that they will fax [place] a determination letter to the office.  [phone]  I called the pt to make her aware that I did the Pradaxa Tier Exceptions as requested and she still had many questions that I cannot answer so I advised her that she should contact her ins provider to get a better understanding of her and her husbands plan/coverage and I also asked her to please bring of her and her husbands insurance cards to the office so we will have the needed information on hand. She again said "what card?".  I again encouraged her to call her ins provide and to please bring all of their ins cards to their next visit with [place].

## 2020-09-15 ENCOUNTER — Telehealth: Payer: Self-pay | Admitting: Nurse Practitioner

## 2020-09-15 NOTE — Telephone Encounter (Signed)
Routing to Dr. Rayann Heman as Juluis Rainier.  I would favor having her come for a nurse visit with her BP cuff to check and see if it actually correlates. Her BP was great at her visit. I think her anxiety is driving most of this. She also has underlying AF.   Cecille Rubin

## 2020-09-15 NOTE — Telephone Encounter (Signed)
Pt c/o BP issue: STAT if pt c/o blurred vision, one-sided weakness or slurred speech  1. What are your last 5 BP readings?  125/70 (at time of phone call) 188/117 last night. Patient called On Call provider last night   2. Are you having any other symptoms (ex. Dizziness, headache, blurred vision, passed out)? Headache, just feels terrible. Symptoms are hard to explain  3. What is your BP issue? Patient said her BP fluctuates a lot and she is not sure what to do

## 2020-09-15 NOTE — Telephone Encounter (Signed)
09/20/20 nurse visit at 2 pm. Patient to bring home cuff.  Patient aware and agreeable.

## 2020-09-15 NOTE — Telephone Encounter (Signed)
On 09/13/20 patient saw Deanna Rivera in office.   1. Reported labile BP - her BP is 160/80 by me in both arms- she is upset - I think this is driving some of the issue - will have her monitor for now - she does not wish to take extra HCTZ due to her incontinence. Ok to use one extra Toprol prn SBP over 180. To see Dr. Rayann Rivera in one month with your BP diary.   09/14/20: patient took daily dose of Metoprolol succinate at 7 am.  Patient took extra dose at 1 pm.  9 pm blood pressure 188/117 called on call covering provider. Pt told to take extra 1/2 tablet. 09/15/20: BP 135/72 8 am before daily dose.   Patient would like to know "what do I do going forward if the extra metoprolol succinate does not work again?"  Will forward to [employer] and Dr. Rayann Rivera for advisement.

## 2020-09-19 ENCOUNTER — Other Ambulatory Visit: Payer: Self-pay | Admitting: Family Medicine

## 2020-09-19 ENCOUNTER — Telehealth: Payer: Self-pay | Admitting: Internal Medicine

## 2020-09-19 ENCOUNTER — Telehealth: Payer: Self-pay

## 2020-09-19 DIAGNOSIS — G7241 Inclusion body myositis [IBM]: Secondary | ICD-10-CM

## 2020-09-19 NOTE — Telephone Encounter (Signed)
  LAST APPOINTMENT DATE: 07/2020  NEXT APPOINTMENT DATE:@Visit  date not found  MEDICATION:PRADAXA 150 MG CAPS capsule  PHARMACY: [facility], [place]  Comments: Patient has a refill coming through mail order but will be out before it gets to her next week.   Please advise

## 2020-09-19 NOTE — Telephone Encounter (Signed)
Noted.   Deanna Rivera 

## 2020-09-19 NOTE — Telephone Encounter (Signed)
Patient is calling to reschedule her Nurse Visit for 09/20/2020. Please advise.

## 2020-09-19 NOTE — Telephone Encounter (Signed)
The patient is requesting to cancel her nurse visit. She states that she is going to get a new blood pressure cuff because hers is "really old" and she will reach back out to our office if her blood get high with the new cuff.  Will forward to [place].

## 2020-09-20 ENCOUNTER — Other Ambulatory Visit: Payer: Self-pay | Admitting: Family Medicine

## 2020-09-20 ENCOUNTER — Other Ambulatory Visit: Payer: Self-pay | Admitting: *Deleted

## 2020-09-20 ENCOUNTER — Encounter: Payer: Self-pay | Admitting: Internal Medicine

## 2020-09-20 ENCOUNTER — Telehealth: Payer: Self-pay

## 2020-09-20 ENCOUNTER — Ambulatory Visit: Payer: Medicare Other

## 2020-09-20 MED ORDER — DABIGATRAN ETEXILATE MESYLATE 150 MG PO CAPS: ORAL_CAPSULE | ORAL | 3 refills | Status: DC

## 2020-09-20 NOTE — Telephone Encounter (Signed)
Please see phone note about medication management.

## 2020-09-20 NOTE — Telephone Encounter (Signed)
..   LAST APPOINTMENT DATE: 07/28/20  NEXT APPOINTMENT DATE:@Visit  date not found  MEDICATION:PRADAXA 150 MG CAPS capsule    [place], [place]  Let patient know to contact pharmacy at the end of the day to make sure medication is ready.  Please notify patient to allow 48-72 hours to process  Encourage patient to contact the pharmacy for refills or they can request refills through [place]:   LAST REFILL:  QTY:  REFILL DATE:    OTHER COMMENTS:    Okay for refill?  Please advise

## 2020-09-20 NOTE — Telephone Encounter (Signed)
**Note De-Identified Fredrick Dray Obfuscation** I called the pt to discuss her Pradaxa tier exception.  I have spoken with the pt and her husband multiple times advising them that their ins provider will not allow a Pradaxa tier exception until he tries and fails Xarelto and she has to try and fail Eliquis and Xarelto which are at lower tiers and will cost less.  Nari continues to talk over me while Im trying to explain what needs to be done and refuses to believe that Dr Rayann Heman can not force their ins provider to pay more than they are now for their Pradaxa.  I finally advised the pt that if she wouldn't allow me to talk that I would call their son Lennette Bihari [facility]) to discuss. She continued to talk loudly so I ended the call and called Lennette Bihari.  Lennette Bihari is aware that without proof that his dad cannot take Xarelto or proof that his Mom cannot take Eliquis or Xarelto their ins plan is not going to approve a tier exception for Pradaxa.  He states that he will discuss with his parents and that they will either: 1. attempt to get documents from their cardiologist in [place] proving that they have to take Pradaxa or  2. they will switch to tier 3 Xarelto or Eliquis.  I gave Lennette Bihari my name and Dr Jackalyn Lombard office phone number so he can call me back with update and I did request that I only deal with him concerning this going forward.

## 2020-09-20 NOTE — Telephone Encounter (Signed)
Patient calling about Pradaxa.   Give information that was given to her son Lennette Bihari:  1. Attempt to get documents from their cardiologist in [place] proving that they have to take Pradaxa or  2. They will switch to tier 3 Xarelto or Eliquis.  Patient will work on getting information from [place]. Patient requested not to speak with Prior Auth RN or have her son contacted about this anymore. Advised patient to fax information with "to the attention of Otila Kluver."  Patient verbalized understanding.

## 2020-09-20 NOTE — Telephone Encounter (Signed)
Pt called following up prescription refill.

## 2020-09-20 NOTE — Telephone Encounter (Signed)
Rx was send to pharmacy  

## 2020-09-20 NOTE — Telephone Encounter (Signed)
Patient call back to speak with Otila Kluver. She states she needs to give her information specifically.

## 2020-09-20 NOTE — Telephone Encounter (Signed)
Rx send to [facility]

## 2020-09-20 NOTE — Telephone Encounter (Signed)
error 

## 2020-09-21 NOTE — Telephone Encounter (Signed)
Spoke to [employer] and gave her advisement that after review of fax that Mr. Hayman must try and fail Xarelto before they will consider a tier exception for Pradaxa. Verbalized understanding and wishes to continue Pradaxa prescription at this time.   Mrs. Mcclatchy will need to try and fail Eliquis and Xarelto before they will consider a tier exception for Pradaxa. Verbalized understanding and wishes to continue Pradaxa prescription at this time.   Advised to call back if they choose to change medication in the future. We would be happy to assist.

## 2020-09-21 NOTE — Telephone Encounter (Signed)
**Note De-Identified Deanna Rivera Obfuscation** I have read the notes from the pts cardiologist in [place], Dr Deanna Rivera.  The notes do not mention the pt ever trying and failing Xarelto which is why his Ins provider will not grant a tier exception for name brand Pradaxa.  The notes also do not prove that Deanna Rivera cannot take Eliquis (no evidence of intolerance or ineffectiveness) but does prove that the pt is not taking recommended doses.  These notes in my opinion will not help the pts Pradaxa tier exception and may in fact hurt their changes of gaining an approval.  As I explained to Deanna Rivera yesterday that per their ins plan they will have to pay for Pradaxa or Deanna Rivera must try and fail Xarelto before they will consider a tier exception for Pradaxa.

## 2020-09-21 NOTE — Telephone Encounter (Signed)
Fax received from [facility] for [employer]. Has been sent to [place] for review.

## 2020-09-28 ENCOUNTER — Other Ambulatory Visit: Payer: Self-pay

## 2020-09-28 MED ORDER — DABIGATRAN ETEXILATE MESYLATE 150 MG PO CAPS: ORAL_CAPSULE | ORAL | 0 refills | Status: DC

## 2020-10-10 ENCOUNTER — Encounter: Payer: Medicare Other | Admitting: Internal Medicine

## 2020-10-12 ENCOUNTER — Telehealth: Payer: Self-pay | Admitting: Internal Medicine

## 2020-10-12 NOTE — Telephone Encounter (Signed)
STAT if HR is under 50 or over 120 (normal HR is 60-100 beats per minute)  1) What is your heart rate? 60  2) Do you have a log of your heart rate readings (document readings)? 101, 90,80, 100, 90  3) Do you have any other symptoms? Patient is extremely tired. She was up all night   Patient wanted to kno wwhy her pacemaker did not control her HR last night

## 2020-10-12 NOTE — Telephone Encounter (Signed)
Patient reports she has had urinary frequency, chills , and decreased energy x 3 days. She noticed an increase in her HR. Remote transmission reviewed that showed device function WNL, PAF, and no events. Recommended patient f/u with PCP . Patient agreed with plan.

## 2020-10-12 NOTE — Telephone Encounter (Signed)
LMOM to call DC , # and hours provided. Patient in PAF. No events on 10/12/20 transmission.

## 2020-10-20 ENCOUNTER — Encounter: Payer: Medicare Other | Admitting: Internal Medicine

## 2020-10-21 ENCOUNTER — Telehealth: Payer: Self-pay

## 2020-10-21 NOTE — Telephone Encounter (Signed)
Patient was scheduled for Cystoscopy yesterday but unable to have performed due to UTI. Urologist given macrobid 100 mg BID. Advised patient to finish antibiotic and f/u next week. Appt scheduled.

## 2020-10-21 NOTE — Telephone Encounter (Signed)
Patient is calling in stating she thinks she may have another uti, and would like a phone call in return to explain to her situation in depth.

## 2020-10-27 ENCOUNTER — Ambulatory Visit (INDEPENDENT_AMBULATORY_CARE_PROVIDER_SITE_OTHER): Payer: Medicare Other | Admitting: Family Medicine

## 2020-10-27 ENCOUNTER — Encounter: Payer: Self-pay | Admitting: Family Medicine

## 2020-10-27 ENCOUNTER — Other Ambulatory Visit: Payer: Self-pay

## 2020-10-27 VITALS — BP 130/78 | HR 59 | Temp 97.3°F | Ht 64.0 in | Wt 131.0 lb

## 2020-10-27 DIAGNOSIS — Z7952 Long term (current) use of systemic steroids: Secondary | ICD-10-CM

## 2020-10-27 DIAGNOSIS — N390 Urinary tract infection, site not specified: Secondary | ICD-10-CM

## 2020-10-27 DIAGNOSIS — N3281 Overactive bladder: Secondary | ICD-10-CM

## 2020-10-27 DIAGNOSIS — G7241 Inclusion body myositis [IBM]: Secondary | ICD-10-CM

## 2020-10-27 LAB — POCT URINALYSIS DIPSTICK
Bilirubin, UA: NEGATIVE
Blood, UA: NEGATIVE
Glucose, UA: NEGATIVE
Ketones, UA: NEGATIVE
Leukocytes, UA: NEGATIVE
Nitrite, UA: NEGATIVE
Protein, UA: NEGATIVE
Spec Grav, UA: 1.015 (ref 1.010–1.025)
Urobilinogen, UA: 0.2 E.U./dL
pH, UA: 6 (ref 5.0–8.0)

## 2020-10-27 NOTE — Progress Notes (Signed)
Subjective  CC:  Chief Complaint  Patient presents with  . Urinary Tract Infection    Pt was on Macrobid and finished antibiotic yesterday.    HPI: Deanna Rivera is a 87 y.o. female who presents to the office today to address the problems listed above in the chief complaint.  87 year old female who was scheduled for cystoscopy for overactive bladder symptoms and perhaps history of gross hematuria last week.  Unfortunately she had a UTI at that time.  She has been treated with Macrobid.  She reports she had malaise and urinary frequency which is chronic but no gross hematuria, dysuria, fevers or chills.  She completed the Macrobid yesterday.  Her energy level is back to normal.  Urinary frequency persist.  She has several questions.  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  She has classic symptoms per rheumatology.  There is no treatment.  No changes in medications are recommended.  Patient has lower extremity weakness.  She does use a walker with a seat so she can sit frequently.  No new symptoms.  No fevers or chills  Office Visit on 10/27/2020  Component Date Value Ref Range Status  . Color, UA 10/27/2020 yellow   Final  . Clarity, UA 10/27/2020 hazy   Final  . Glucose, UA 10/27/2020 Negative  Negative Final  . Bilirubin, UA 10/27/2020 negative   Final  . Ketones, UA 10/27/2020 negative   Final  . Spec Grav, UA 10/27/2020 1.015  1.010 - 1.025 Final  . Blood, UA 10/27/2020 negative   Final  . pH, UA 10/27/2020 6.0  5.0 - 8.0 Final  . Protein, UA 10/27/2020 Negative  Negative Final  . Urobilinogen, UA 10/27/2020 0.2  0.2 or 1.0 E.U./dL Final  . Nitrite, UA 10/27/2020 negative   Final  . Leukocytes, UA 10/27/2020 Negative  Negative Final    Assessment  1. Recurrent UTI   2. OAB (overactive bladder)   3. Inclusion body myositis   4. Long term current use of systemic steroids      Plan   UTI:  Normal dipstick today.  Patient is feeling better.  Completed 7-day course of Macrobid.  Will await culture.  All questions answered.  If culture is negative, recommend follow-up with urology for further evaluation and treatment of her overactive bladder symptoms.  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  Immunocompromise.  Continue prednisone low-dose daily.  Prednisone 1 mg daily  Follow up: As needed Visit date not found  Orders Placed This Encounter  Procedures  . Urine Culture  . POCT urinalysis dipstick   No orders of the defined types were placed in this encounter.     I reviewed the patients updated PMH, FH, and SocHx.    Patient Active Problem List   Diagnosis Date Noted  . SSS (sick sinus syndrome) ([place]) 02/11/2019    Priority: High  . Status post placement of cardiac pacemaker 07/08/2018    Priority: High  . 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 Nontoxic multinodular goiter 09/08/2015    Priority: High  . Current use of long term anticoagulation 05/16/2015    Priority: High  . (HFpEF) heart failure with preserved ejection fraction ([place]) 09/13/2014    Priority: High  . Pacemaker 11/09/2011    Priority: High  . Impaired fasting glucose 05/23/2009    Priority: High  . Long term current use of systemic steroids 03/30/2008  Priority: High  . Polymyalgia rheumatica ([place]) 03/30/2008    Priority: High  . Benign essential hypertension 05/05/2007    Priority: High  . Persistent atrial fibrillation ([place]) 11/06/2006    Priority: High  . Pure hypercholesterolemia 11/06/2006    Priority: High  . Hypothyroidism due to Hashimoto's thyroiditis, Rx Levothyroxine 06/25/2005    Priority: High  . Primary osteoarthritis of right knee 10/23/2019    Priority: Medium  . Nonrheumatic mitral valve regurgitation 09/16/2015    Priority: Medium  . Recurrent UTI 03/14/2015    Priority: Medium  . Neuralgia, postherpetic 07/02/2019    Priority: Low  . OAB  (overactive bladder) 07/02/2019    Priority: Low  . Constipation 01/18/2019    Priority: Low  . Shellfish allergy 11/22/2015  . Synovial cyst of popliteal space 11/22/2008   Current Meds  Medication Sig  . dabigatran (PRADAXA) 150 MG CAPS capsule TAKE 1 CAPSULE BY MOUTH  EVERY 12 HOURS  . digoxin (LANOXIN) 0.125 MG tablet Take 1 tablet (0.125 mg total) by mouth daily.  . hydrochlorothiazide (HYDRODIURIL) 25 MG tablet Take 0.5 tablets (12.5 mg total) by mouth daily.  Marland Kitchen levothyroxine (SYNTHROID) 112 MCG tablet TAKE 1 TABLET BY MOUTH  DAILY  . LUMIGAN 0.01 % SOLN SMARTSIG:In Eye(s)  . Metoprolol Succinate 50 MG CS24 Take 50 mg by mouth daily.  . predniSONE (DELTASONE) 1 MG tablet TAKE 1 TABLET BY MOUTH  DAILY    Allergies: Patient is allergic to monosodium glutamate, ciprofloxacin, epinephrine, erythromycin, shellfish allergy, sulfamethoxazole-trimethoprim, and tetracyclines & related. Family History: Patient family history includes Cirrhosis (age of onset: 19) in her father; Early death (age of onset: 53) in her mother; Hypertension in her brother; Lung cancer (age of onset: 63) in her brother; Scleroderma in her daughter. Social History:  Patient  reports that she has never smoked. She has never used smokeless tobacco. She reports that she does not drink alcohol and does not use drugs.  Review of Systems: Constitutional: Negative for fever malaise or anorexia Cardiovascular: negative for chest pain Respiratory: negative for SOB or persistent cough Gastrointestinal: negative for abdominal pain  Objective  Vitals: BP 130/78 (BP Location: Left Arm, Patient Position: Sitting, Cuff Size: Normal)   Pulse (!) 59   Temp (!) 97.3 F (36.3 C) (Temporal)   Ht 5\' 4"  (1.626 m)   Wt 131 lb (59.4 kg)   SpO2 98%   BMI 22.49 kg/m  General: no acute distress , A&Ox3, appears well, ambulates with walker Abdomen: Benign, no suprapubic tenderness, no CVA tenderness     Commons side effects,  risks, benefits, and alternatives for medications and treatment plan prescribed today were discussed, and the patient expressed understanding of the given instructions. Patient is instructed to call or message via MyChart if he/she has any questions or concerns regarding our treatment plan. No barriers to understanding were identified. We discussed Red Flag symptoms and signs in detail. Patient expressed understanding regarding what to do in case of urgent or emergency type symptoms.   Medication list was reconciled, printed and provided to the patient in AVS. Patient instructions and summary information was reviewed with the patient as documented in the AVS. This note was prepared with assistance of Dragon voice recognition software. Occasional wrong-word or sound-a-like substitutions may have occurred due to the inherent limitations of voice recognition software  This visit occurred during the SARS-CoV-2 public health emergency.  Safety protocols were in place, including screening questions prior to the visit, additional usage of staff  PPE, and extensive cleaning of exam room while observing appropriate contact time as indicated for disinfecting solutions.

## 2020-10-28 LAB — URINE CULTURE
MICRO NUMBER:: 11659571
Result:: NO GROWTH
SPECIMEN QUALITY:: ADEQUATE

## 2020-10-29 ENCOUNTER — Other Ambulatory Visit: Payer: Self-pay | Admitting: Family Medicine

## 2020-10-31 ENCOUNTER — Telehealth: Payer: Self-pay

## 2020-10-31 NOTE — Progress Notes (Signed)
Please call patient: I have reviewed his/her lab results. Good news: the bladder infection has resolved.

## 2020-10-31 NOTE — Telephone Encounter (Signed)
patient called back regarding lab results

## 2020-11-01 NOTE — Telephone Encounter (Signed)
See result note.  

## 2020-11-09 ENCOUNTER — Ambulatory Visit (INDEPENDENT_AMBULATORY_CARE_PROVIDER_SITE_OTHER): Payer: Medicare Other

## 2020-11-09 DIAGNOSIS — I495 Sick sinus syndrome: Secondary | ICD-10-CM

## 2020-11-09 LAB — CUP PACEART REMOTE DEVICE CHECK
Battery Remaining Longevity: 120 mo
Battery Remaining Percentage: 100 %
Brady Statistic RA Percent Paced: 0 %
Brady Statistic RV Percent Paced: 74 %
Date Time Interrogation Session: 20220330064200
Implantable Lead Implant Date: 20130305
Implantable Lead Implant Date: 20130305
Implantable Lead Location: 753859
Implantable Lead Location: 753860
Implantable Lead Model: 4469
Implantable Lead Model: 4470
Implantable Lead Serial Number: 546229
Implantable Lead Serial Number: 712016
Implantable Pulse Generator Implant Date: 20200409
Lead Channel Impedance Value: 420 Ohm
Lead Channel Impedance Value: 511 Ohm
Lead Channel Pacing Threshold Amplitude: 0.7 V
Lead Channel Pacing Threshold Pulse Width: 0.4 ms
Lead Channel Setting Pacing Amplitude: 1.1 V
Lead Channel Setting Pacing Amplitude: 2.5 V
Lead Channel Setting Pacing Pulse Width: 0.4 ms
Lead Channel Setting Sensing Sensitivity: 0.6 mV
Pulse Gen Serial Number: 859007

## 2020-11-22 NOTE — Progress Notes (Signed)
Remote pacemaker transmission.   

## 2020-12-30 ENCOUNTER — Other Ambulatory Visit: Payer: Self-pay | Admitting: Family Medicine

## 2021-01-18 ENCOUNTER — Other Ambulatory Visit: Payer: Self-pay

## 2021-01-18 ENCOUNTER — Ambulatory Visit (INDEPENDENT_AMBULATORY_CARE_PROVIDER_SITE_OTHER): Payer: Medicare Other | Admitting: Internal Medicine

## 2021-01-18 ENCOUNTER — Encounter: Payer: Self-pay | Admitting: Internal Medicine

## 2021-01-18 VITALS — BP 132/84 | HR 64 | Ht 64.0 in | Wt 132.0 lb

## 2021-01-18 DIAGNOSIS — R001 Bradycardia, unspecified: Secondary | ICD-10-CM

## 2021-01-18 DIAGNOSIS — I10 Essential (primary) hypertension: Secondary | ICD-10-CM

## 2021-01-18 DIAGNOSIS — I441 Atrioventricular block, second degree: Secondary | ICD-10-CM | POA: Diagnosis not present

## 2021-01-18 DIAGNOSIS — I4819 Other persistent atrial fibrillation: Secondary | ICD-10-CM

## 2021-01-18 DIAGNOSIS — I340 Nonrheumatic mitral (valve) insufficiency: Secondary | ICD-10-CM

## 2021-01-18 NOTE — Progress Notes (Signed)
    PCP: [name], MD Primary Cardiologist: Dr Edison Simon Primary EP:  Dr Rayann Heman  Deanna Rivera is a 87 y.o. female who presents today for routine electrophysiology followup.  Since last being seen in our clinic, the patient reports doing very well.  Today, she denies symptoms of palpitations, chest pain, shortness of breath,  dizziness, presyncope, or syncope.  + mild edema.  The patient is otherwise without complaint today.   Past Medical History:  Diagnosis Date  . Cataract   . Hypertension   . Moderate aortic stenosis   . Moderate mitral regurgitation   . Persistent atrial fibrillation ([place])   . PMR (polymyalgia rheumatica) ([facility])   . Second degree AV block   . Sick sinus syndrome ([place])   . Thyroid disease    Past Surgical History:  Procedure Laterality Date  . ABDOMINAL HYSTERECTOMY    . CESAREAN SECTION    . CHOLECYSTECTOMY    . PACEMAKER GENERATOR CHANGE  11/20/2018   [employer] Accolade MRI PPM implanted in [place]  . PACEMAKER IMPLANT    . PILONIDAL CYST EXCISION      ROS- all systems are reviewed and negative except as per HPI above  Current Outpatient Medications  Medication Sig Dispense Refill  . dabigatran (PRADAXA) 150 MG CAPS capsule TAKE 1 CAPSULE BY MOUTH  EVERY 12 HOURS 60 capsule 0  . digoxin (LANOXIN) 0.125 MG tablet Take 1 tablet (0.125 mg total) by mouth daily. 90 tablet 3  . hydrochlorothiazide (HYDRODIURIL) 25 MG tablet TAKE 1 TABLET BY MOUTH  DAILY 90 tablet 3  . levothyroxine (SYNTHROID) 112 MCG tablet TAKE 1 TABLET BY MOUTH  DAILY 90 tablet 3  . LUMIGAN 0.01 % SOLN SMARTSIG:In Eye(s)    . Metoprolol Succinate 50 MG CS24 Take 50 mg by mouth daily. 90 capsule 2  . predniSONE (DELTASONE) 1 MG tablet TAKE 1 TABLET BY MOUTH  DAILY 90 tablet 3   No current facility-administered medications for this visit.    Physical Exam: Vitals:   01/18/21 1506  BP: 132/84  Pulse: 64  SpO2: 99%  Weight: 132 lb (59.9 kg)  Height: 5\' 4"  (1.626 m)     GEN- The patient is well appearing, alert and oriented x 3 today.   Head- normocephalic, atraumatic Eyes-  Sclera clear, conjunctiva pink Ears- hearing intact Oropharynx- clear Lungs- Clear to ausculation bilaterally, normal work of breathing Chest- pacemaker pocket is well healed Heart- Regular rate and rhythm 2/6 SEM at the apex GI- soft, NT, ND, + BS +1 ankle edema  Pacemaker interrogation- reviewed in detail today,  See PACEART report  ekg tracing ordered today is personally reviewed and shows afib with demand V pacing  Assessment and Plan:  1. Symptomatic sinus bradycardia and second degree heart block Normal pacemaker function See Pace Art report Programmed VVIR today she is not device dependant today  2. Permanent afib Rate controlled On pradaxa  Check cbc, bmet, dig level today  3. HTN Stable No change required today  4. MR Moderate by echo 2021 Repeat echo   5. Venous insufficiency I have advised support hose.  She does not wish to wear these Sodium restriction  12 months with Jodene Nam MD, [facility] 01/18/2021 3:32 PM

## 2021-01-18 NOTE — Patient Instructions (Addendum)
Medication Instructions:  Your physician recommends that you continue on your current medications as directed. Please refer to the Current Medication list given to you today.  Labwork: BMP, CBC, Dig level  Testing/Procedures: Your physician has requested that you have an echocardiogram. Echocardiography is a painless test that uses sound waves to create images of your heart. It provides your doctor with information about the size and shape of your heart and how well your heart's chambers and valves are working. This procedure takes approximately one hour. There are no restrictions for this procedure.   Follow-Up: Your physician wants you to follow-up in: 12 months with     Tommye Standard, PA-C     You will receive a reminder letter in the mail two months in advance. If you don't receive a letter, please call our office to schedule the follow-up appointment.  Remote monitoring is used to monitor your Pacemaker from home. This monitoring reduces the number of office visits required to check your device to one time per year. It allows [place] to keep an eye on the functioning of your device to ensure it is working properly. You are scheduled for a device check from home on 02/08/21. You may send your transmission at any time that day. If you have a wireless device, the transmission will be sent automatically. After your physician reviews your transmission, you will receive a postcard with your next transmission date.  Any Other Special Instructions Will Be Listed Below (If Applicable).  If you need a refill on your cardiac medications before your next appointment, please call your pharmacy.

## 2021-01-19 LAB — BASIC METABOLIC PANEL
BUN/Creatinine Ratio: 50 — ABNORMAL HIGH (ref 12–28)
BUN: 18 mg/dL (ref 8–27)
CO2: 28 mmol/L (ref 20–29)
Calcium: 10.6 mg/dL — ABNORMAL HIGH (ref 8.7–10.3)
Chloride: 93 mmol/L — ABNORMAL LOW (ref 96–106)
Creatinine, Ser: 0.36 mg/dL — ABNORMAL LOW (ref 0.57–1.00)
Glucose: 89 mg/dL (ref 65–99)
Potassium: 4.5 mmol/L (ref 3.5–5.2)
Sodium: 134 mmol/L (ref 134–144)
eGFR: 99 mL/min/{1.73_m2} (ref >59)

## 2021-01-19 LAB — CBC
Hematocrit: 40.3 % (ref 34.0–46.6)
Hemoglobin: 13.7 g/dL (ref 11.1–15.9)
MCH: 31.6 pg (ref 26.6–33.0)
MCHC: 34 g/dL (ref 31.5–35.7)
MCV: 93 fL (ref 79–97)
Platelets: 285 10*3/uL (ref 150–450)
RBC: 4.33 x10E6/uL (ref 3.77–5.28)
RDW: 12.3 % (ref 11.7–15.4)
WBC: 7.6 10*3/uL (ref 3.4–10.8)

## 2021-01-19 LAB — DIGOXIN LEVEL: Digoxin, Serum: 0.8 ng/mL (ref 0.5–0.9)

## 2021-01-21 ENCOUNTER — Emergency Department (HOSPITAL_COMMUNITY)
Admission: EM | Admit: 2021-01-21 | Discharge: 2021-01-21 | Disposition: A | Discharge status: Home / Self Care | Payer: Medicare Other | Attending: Emergency Medicine | Admitting: Emergency Medicine

## 2021-01-21 ENCOUNTER — Encounter (HOSPITAL_COMMUNITY): Payer: Self-pay | Admitting: Emergency Medicine

## 2021-01-21 ENCOUNTER — Emergency Department (HOSPITAL_COMMUNITY): Payer: Medicare Other

## 2021-01-21 DIAGNOSIS — E039 Hypothyroidism, unspecified: Secondary | ICD-10-CM | POA: Insufficient documentation

## 2021-01-21 DIAGNOSIS — Z950 Presence of cardiac pacemaker: Secondary | ICD-10-CM | POA: Insufficient documentation

## 2021-01-21 DIAGNOSIS — R350 Frequency of micturition: Secondary | ICD-10-CM | POA: Insufficient documentation

## 2021-01-21 DIAGNOSIS — R531 Weakness: Secondary | ICD-10-CM | POA: Insufficient documentation

## 2021-01-21 DIAGNOSIS — R5383 Other fatigue: Secondary | ICD-10-CM | POA: Diagnosis not present

## 2021-01-21 DIAGNOSIS — Z79899 Other long term (current) drug therapy: Secondary | ICD-10-CM | POA: Insufficient documentation

## 2021-01-21 DIAGNOSIS — R110 Nausea: Secondary | ICD-10-CM | POA: Insufficient documentation

## 2021-01-21 DIAGNOSIS — Z20822 Contact with and (suspected) exposure to covid-19: Secondary | ICD-10-CM | POA: Insufficient documentation

## 2021-01-21 DIAGNOSIS — R5381 Other malaise: Secondary | ICD-10-CM | POA: Diagnosis not present

## 2021-01-21 DIAGNOSIS — I110 Hypertensive heart disease with heart failure: Secondary | ICD-10-CM | POA: Diagnosis not present

## 2021-01-21 DIAGNOSIS — G479 Sleep disorder, unspecified: Secondary | ICD-10-CM | POA: Diagnosis not present

## 2021-01-21 DIAGNOSIS — I509 Heart failure, unspecified: Secondary | ICD-10-CM | POA: Diagnosis not present

## 2021-01-21 LAB — BASIC METABOLIC PANEL
Anion gap: 8 (ref 5–15)
BUN: 10 mg/dL (ref 8–23)
CO2: 28 mmol/L (ref 22–32)
Calcium: 9.9 mg/dL (ref 8.9–10.3)
Chloride: 95 mmol/L — ABNORMAL LOW (ref 98–111)
Creatinine, Ser: 0.34 mg/dL — ABNORMAL LOW (ref 0.44–1.00)
GFR, Estimated: >60 mL/min (ref >60)
Glucose, Bld: 102 mg/dL — ABNORMAL HIGH (ref 70–99)
Potassium: 3.9 mmol/L (ref 3.5–5.1)
Sodium: 131 mmol/L — ABNORMAL LOW (ref 135–145)

## 2021-01-21 LAB — URINALYSIS, ROUTINE W REFLEX MICROSCOPIC
Bilirubin Urine: NEGATIVE
Glucose, UA: NEGATIVE mg/dL
Hgb urine dipstick: NEGATIVE
Ketones, ur: NEGATIVE mg/dL
Leukocytes,Ua: NEGATIVE
Nitrite: NEGATIVE
Protein, ur: NEGATIVE mg/dL
Specific Gravity, Urine: 1.006 (ref 1.005–1.030)
pH: 8 (ref 5.0–8.0)

## 2021-01-21 LAB — RESP PANEL BY RT-PCR (FLU A&B, COVID) ARPGX2
Influenza A by PCR: NEGATIVE
Influenza B by PCR: NEGATIVE
SARS Coronavirus 2 by RT PCR: NEGATIVE

## 2021-01-21 LAB — HEPATIC FUNCTION PANEL
ALT: 30 U/L (ref 0–44)
AST: 44 U/L — ABNORMAL HIGH (ref 15–41)
Albumin: 3.4 g/dL — ABNORMAL LOW (ref 3.5–5.0)
Alkaline Phosphatase: 52 U/L (ref 38–126)
Bilirubin, Direct: 0.3 mg/dL — ABNORMAL HIGH (ref 0.0–0.2)
Indirect Bilirubin: 0.7 mg/dL (ref 0.3–0.9)
Total Bilirubin: 1 mg/dL (ref 0.3–1.2)
Total Protein: 7.6 g/dL (ref 6.5–8.1)

## 2021-01-21 LAB — CBC
HCT: 40.6 % (ref 36.0–46.0)
Hemoglobin: 13.6 g/dL (ref 12.0–15.0)
MCH: 31.9 pg (ref 26.0–34.0)
MCHC: 33.5 g/dL (ref 30.0–36.0)
MCV: 95.3 fL (ref 80.0–100.0)
Platelets: 272 10*3/uL (ref 150–400)
RBC: 4.26 MIL/uL (ref 3.87–5.11)
RDW: 12.6 % (ref 11.5–15.5)
WBC: 4.9 10*3/uL (ref 4.0–10.5)
nRBC: 0 % (ref 0.0–0.2)

## 2021-01-21 LAB — TROPONIN I (HIGH SENSITIVITY)
Troponin I (High Sensitivity): 13 ng/L (ref <18)
Troponin I (High Sensitivity): 15 ng/L (ref <18)

## 2021-01-21 LAB — DIGOXIN LEVEL: Digoxin Level: 1 ng/mL (ref 0.8–2.0)

## 2021-01-21 LAB — LIPASE, BLOOD: Lipase: 33 U/L (ref 11–51)

## 2021-01-21 MED ORDER — ONDANSETRON 4 MG PO TBDP: 4.0000 mg | ORAL_TABLET | Freq: Three times a day (TID) | ORAL | 0 refills | Status: DC | PRN

## 2021-01-21 NOTE — ED Notes (Signed)
PO challenge started

## 2021-01-21 NOTE — Discharge Instructions (Addendum)
Be sure to stay well-hydrated by drinking plenty of fluids.  Nausea/vomiting: Use the ondansetron (generic for Zofran) for nausea or vomiting.  This medication may not prevent all vomiting or nausea, but can help facilitate better hydration. Things that can help with nausea/vomiting also include peppermint/menthol candies, vitamin B12, and ginger.  Test Results for COVID-19 pending  You have a test pending for COVID-19.  Results typically return within about 48 hours.  Be sure to check MyChart for updated results.  We recommend isolating yourself until results are received.  Patients who have symptoms consistent with COVID-19 should self isolated for: At least 3 days (72 hours) have passed since recovery, defined as resolution of fever without the use of fever reducing medications and improvement in respiratory symptoms (e.g., cough, shortness of breath), and At least 7 days have passed since symptoms first appeared.  If you have no symptoms, but your test returns positive, recommend isolating for at least 10 days.

## 2021-01-21 NOTE — ED Provider Notes (Signed)
Deanna Rivera is a 87 y.o. female, presenting to the ED with generalized weakness, hypertension, nausea.  Denies chest pain, syncope, dizziness, abdominal pain.  HPI from Deanna Mail, PA-C: "Deanna Rivera is a 87 y.o. female who presents with a complaint of malaise.  Patient states that she was having a lot of difficulty sleeping last night and felt extremely restless.  She woke her husband up to tell him how restless she was.  She was able to go to bed and when she woke up she felt extremely tremulous, she took her blood pressure and noted that it was very elevated at 993 systolic.  She did not take her blood pressure medications last night as opposed to the triage note.  She took her regular medications again this morning.  She felt extremely weak and nauseous.  She denies any vomiting, diarrhea, cough.  She has frequency and incontinence of urine but denies any dysuria, back pain or foul odor.  She denies abdominal pain."  Physical Exam  BP (!) 164/110 (BP Location: Right Arm)   Pulse 69   Temp 98.7 F (37.1 C) (Oral)   Resp 12   Ht 5\' 4"  (1.626 m)   Wt 59.9 kg   SpO2 99%   BMI 22.66 kg/m   Physical Exam Vitals and nursing note reviewed.  Constitutional:      General: She is not in acute distress.    Appearance: She is well-developed. She is not diaphoretic.  HENT:     Head: Normocephalic and atraumatic.     Mouth/Throat:     Mouth: Mucous membranes are moist.     Pharynx: Oropharynx is clear.  Eyes:     Conjunctiva/sclera: Conjunctivae normal.  Cardiovascular:     Rate and Rhythm: Normal rate and regular rhythm.     Pulses: Normal pulses.          Radial pulses are 2+ on the right side and 2+ on the left side.     Heart sounds: Murmur heard.  Pulmonary:     Effort: Pulmonary effort is normal. No respiratory distress.     Breath sounds: Normal breath sounds.  Abdominal:     Palpations: Abdomen is soft.     Tenderness: There is no abdominal tenderness. There is no  guarding.  Musculoskeletal:     Cervical back: Neck supple.  Skin:    General: Skin is warm and dry.  Neurological:     Mental Status: She is alert and oriented to person, place, and time.  Psychiatric:        Mood and Affect: Mood and affect normal.        Speech: Speech normal.        Behavior: Behavior normal.    ED Course/Procedures     Procedures   Abnormal Labs Reviewed  BASIC METABOLIC PANEL - Abnormal; Notable for the following components:      Result Value   Sodium 131 (*)    Chloride 95 (*)    Glucose, Bld 102 (*)    Creatinine, Ser 0.34 (*)    All other components within normal limits  URINALYSIS, ROUTINE W REFLEX MICROSCOPIC - Abnormal; Notable for the following components:   Color, Urine STRAW (*)    All other components within normal limits  HEPATIC FUNCTION PANEL - Abnormal; Notable for the following components:   Albumin 3.4 (*)    AST 44 (*)    Bilirubin, Direct 0.3 (*)    All other components within normal limits  DG Chest 2 View  Result Date: 01/21/2021 CLINICAL DATA:  Weakness EXAM: CHEST - 2 VIEW COMPARISON:  None. FINDINGS: The cardiomediastinal silhouette is enlarged in contour.LEFT chest cardiac pacing device. No pleural effusion. No pneumothorax. No acute pleuroparenchymal abnormality. Visualized abdomen is unremarkable. Mild degenerative changes of the thoracic spine. Atherosclerotic calcifications. IMPRESSION: Cardiomegaly.  Clear lungs. Electronically Signed   By: Valentino Saxon MD   On: 01/21/2021 15:50     EKG Interpretation  Date/Time:  Saturday January 21 2021 08:49:48 EDT Ventricular Rate:  60 PR Interval:    QRS Duration: 94 QT Interval:  374 QTC Calculation: 374 R Axis:   54 Text Interpretation: Ventricular-paced rhythm Abnormal ECG Confirmed by Orpah Greek [id number]) on 01/21/2021 12:47:22 PM         MDM   Patient care handoff report received from Deanna Mail, PA-C. Plan: Patient awaiting results from troponin.   If unremarkable, likely discharge.  Patient presents complaining of generalized weakness, nausea, fatigue. Patient is nontoxic appearing, afebrile, not tachycardic, not tachypneic, not hypotensive, maintains excellent SPO2 on room air, and is in no apparent distress.   I have reviewed the patient's chart to obtain more information.   I reviewed and interpreted the patient's labs and radiological studies. Mild hyponatremia and hypochloremia, but lab work largely unremarkable. We discussed the very slight AST elevation.  She will follow-up with her PCP on this for retest. Upon my interview, she states she feels much better.  Tolerating PO fluids prior to discharge. Patient and her husband at the bedside were given instructions for home care as well as return precautions.  Both parties voice understanding of these instructions, accept the plan, and are comfortable with discharge.  Vitals:   01/21/21 1500 01/21/21 1630 01/21/21 1700 01/21/21 1757  BP: (!) 146/66 (!) 163/86 (!) 176/71 (!) 168/76  Pulse: 60 61 (!) 59 60  Resp: 16  16 16   Temp:    98 F (36.7 C)  TempSrc:    Oral  SpO2: 98% 97% 98% 98%  Weight:      Height:           Deanna Rivera 01/21/21 1852    Deanna Saver, MD 01/21/21 1909

## 2021-01-21 NOTE — ED Notes (Signed)
Pt telling family on the phone that staff is ignoring her and not doing anything for her.  Pt notified again that we have drawn labs and re-taken her blood pressure and that we will get her into a room as soon as one becomes available.

## 2021-01-21 NOTE — ED Notes (Signed)
Patient is resting comfortably. 

## 2021-01-21 NOTE — ED Provider Notes (Signed)
[facility] EMERGENCY DEPARTMENT Provider Note   CSN: [id number] Arrival date & time: 01/21/21  1194     History Chief Complaint  Patient presents with   Nausea   Hypertension    Deanna Rivera is a 87 y.o. female who presents with a complaint of malaise.  Patient states that she was having a lot of difficulty sleeping last night and felt extremely restless.  She woke her husband up to tell him how restless she was.  She was able to go to bed and when she woke up she felt extremely tremulous, she took her blood pressure and noted that it was very elevated at 174 systolic.  She did not take her blood pressure medications last night as opposed to the triage note.  She took her regular medications again this morning.  She felt extremely weak and nauseous.  She denies any vomiting, diarrhea, cough.  She has frequency and incontinence of urine but denies any dysuria, back pain or foul odor.  She denies abdominal pain.   Hypertension      Past Medical History:  Diagnosis Date   Cataract    Hypertension    Moderate aortic stenosis    Moderate mitral regurgitation    Persistent atrial fibrillation (HCC)    PMR (polymyalgia rheumatica) (HCC)    Second degree AV block    Sick sinus syndrome ([place])    Thyroid disease     Patient Active Problem List   Diagnosis Date Noted   Primary osteoarthritis of right knee 10/23/2019   Neuralgia, postherpetic 07/02/2019   OAB (overactive bladder) 07/02/2019   SSS (sick sinus syndrome) ([place]) 02/11/2019   Constipation 01/18/2019   Status post placement of cardiac pacemaker 07/08/2018   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   Shellfish allergy 11/22/2015   Nonrheumatic mitral valve regurgitation 09/16/2015   Nontoxic multinodular goiter 09/08/2015   Current use of long term anticoagulation 05/16/2015   Recurrent UTI 03/14/2015   (HFpEF) heart failure with preserved ejection fraction ([place]) 09/13/2014   Pacemaker 11/09/2011    Impaired fasting glucose 05/23/2009   Synovial cyst of popliteal space 11/22/2008   Long term current use of systemic steroids 03/30/2008   Polymyalgia rheumatica ([place]) 03/30/2008   Benign essential hypertension 05/05/2007   Persistent atrial fibrillation ([place]) 11/06/2006   Pure hypercholesterolemia 11/06/2006   Hypothyroidism due to Hashimoto's thyroiditis, Rx Levothyroxine 06/25/2005    Past Surgical History:  Procedure Laterality Date   ABDOMINAL HYSTERECTOMY     CESAREAN SECTION     CHOLECYSTECTOMY     PACEMAKER GENERATOR CHANGE  11/20/2018   [employer] Accolade MRI PPM implanted in [place]       OB History   No obstetric history on file.     Family History  Problem Relation Age of Onset   Early death Mother 42   Hypertension Brother    Cirrhosis Father 11   Lung cancer Brother 57   Scleroderma Daughter     Social History   Tobacco Use   Smoking status: Never   Smokeless tobacco: Never  Substance Use Topics   Alcohol use: No   Drug use: No    Home Medications Prior to Admission medications   Medication Sig Start Date End Date Taking? Authorizing Provider  dabigatran (PRADAXA) 150 MG CAPS capsule TAKE 1 CAPSULE BY MOUTH  EVERY 12 HOURS 09/28/20   [name], MD  digoxin (LANOXIN) 0.125 MG tablet  Take 1 tablet (0.125 mg total) by mouth daily. 08/17/20   Allred, Jeneen Rinks, MD  hydrochlorothiazide (HYDRODIURIL) 25 MG tablet TAKE 1 TABLET BY MOUTH  DAILY 10/31/20   [name], MD  levothyroxine (SYNTHROID) 112 MCG tablet TAKE 1 TABLET BY MOUTH  DAILY 12/30/20   [name], MD  LUMIGAN 0.01 % SOLN SMARTSIG:In Eye(s) 08/15/20   [provider]  Metoprolol Succinate 50 MG CS24 Take 50 mg by mouth daily. 11/11/19   [name], MD  predniSONE (DELTASONE) 1 MG tablet TAKE 1 TABLET BY MOUTH  DAILY 09/20/20   [name], MD    Allergies    Monosodium glutamate, Ciprofloxacin, Epinephrine,  Erythromycin, Shellfish allergy, Sulfamethoxazole-trimethoprim, and Tetracyclines & related  Review of Systems   Review of Systems Ten systems reviewed and are negative for acute change, except as noted in the HPI.   Physical Exam Updated Vital Signs BP (!) 164/110 (BP Location: Right Arm)   Pulse 69   Temp 98.7 F (37.1 C) (Oral)   Resp 12   Ht 5\' 4"  (1.626 m)   Wt 59.9 kg   SpO2 99%   BMI 22.66 kg/m   Physical Exam Vitals and nursing note reviewed.  Constitutional:      General: She is not in acute distress.    Appearance: She is well-developed. She is not diaphoretic.  HENT:     Head: Normocephalic and atraumatic.     Right Ear: External ear normal.     Left Ear: External ear normal.     Nose: Nose normal.     Mouth/Throat:     Mouth: Mucous membranes are moist.  Eyes:     General: No scleral icterus.    Conjunctiva/sclera: Conjunctivae normal.  Cardiovascular:     Rate and Rhythm: Rhythm irregular.     Heart sounds: Normal heart sounds. No murmur heard.   No friction rub. No gallop.  Pulmonary:     Effort: Pulmonary effort is normal. No respiratory distress.     Breath sounds: Normal breath sounds.  Abdominal:     General: Bowel sounds are normal. There is no distension.     Palpations: Abdomen is soft. There is no mass.     Tenderness: There is no abdominal tenderness. There is no guarding.  Musculoskeletal:     Cervical back: Normal range of motion.  Skin:    General: Skin is warm and dry.  Neurological:     Mental Status: She is alert and oriented to person, place, and time.  Psychiatric:        Behavior: Behavior normal.    ED Results / Procedures / Treatments   Labs (all labs ordered are listed, but only abnormal results are displayed) Labs Reviewed  BASIC METABOLIC PANEL - Abnormal; Notable for the following components:      Result Value   Sodium 131 (*)    Chloride 95 (*)    Glucose, Bld 102 (*)    Creatinine, Ser 0.34 (*)    All other  components within normal limits  CBC  URINALYSIS, ROUTINE W REFLEX MICROSCOPIC  HEPATIC FUNCTION PANEL  LIPASE, BLOOD  TROPONIN I (HIGH SENSITIVITY)    EKG EKG Interpretation  Date/Time:  Saturday January 21 2021 08:49:48 EDT Ventricular Rate:  60 PR Interval:    QRS Duration: 94 QT Interval:  374 QTC Calculation: 374 R Axis:   54 Text Interpretation: Ventricular-paced rhythm Abnormal ECG Confirmed by Orpah Greek [phone]) on 01/21/2021 12:47:22  PM  Radiology No results found.  Procedures Procedures   Medications Ordered in ED Medications - No data to display  ED Course  I have reviewed the triage vital signs and the nursing notes.  Pertinent labs & imaging results that were available during my care of the patient were reviewed by me and considered in my medical decision making (see chart for details).    MDM Rules/Calculators/A&P                          Patient here with very vague complaints. Clearly anxious about her health.  She felt restless, shaky and fatigues with mild nausea. Emergent DDX in this elderly female  includes but is not limited to neurologic causes (GBS, myasthenia gravis, CVA, MS, ALS, transverse myelitis, spinal cord injury, CVA, botulism, ) and other causes: ACS, Arrhythmia, syncope, orthostatic hypotension, sepsis, hypoglycemia, electrolyte disturbance, hypothyroidism, respiratory failure, symptomatic anemia, dehydration, heat injury, polypharmacy, malignancy. I have very low suspicion for emergent cause of her symptoms. I have ordered labs Cbc, urine, lipase, dig level, and initial troponin wnl.Resp panel pending.  Hepatic fx panel/ bmp abnormal but without any emergent findings.  Troponin is pending. 2 view chest x-ray shows no acute abnormalities, EKG shows ventricular paced rhythm at a rate of 60 without evidence of ischemia.  Handoff at shift change given to [place] who will assume care of the patient.  If troponins are negative I  suspect she may be discharged with outpatient follow-up    Final Clinical Impression(s) / ED Diagnoses Final diagnoses:  None    Rx / DC Orders ED Discharge Orders     None        [name], PA-C 01/22/21 1450    Orpah Greek, MD 01/23/21 [phone]

## 2021-01-21 NOTE — ED Triage Notes (Signed)
Pt arrives via EMS from home with not feeling well since last night. Unable to take HTN meds last night due to fatigue but reports taking them this morning. States she was restless and shaky all night and became nauseous today. Hypertensive for EMS and in triage. States she was at her PCP 3 days ago and ordered an echo.

## 2021-01-21 NOTE — ED Notes (Signed)
Pt requested bp be taken again.  She is crying, stating she is tired.

## 2021-01-23 NOTE — Progress Notes (Signed)
Labs reviewed. Mild hypercalcemia on hctz. Will monitor.

## 2021-02-03 ENCOUNTER — Telehealth: Payer: Self-pay

## 2021-02-03 NOTE — Telephone Encounter (Signed)
Nurse Assessment Nurse: Clovis Riley RN, Georgina Peer Date/Time (Eastern Time): 02/03/2021 11:34:47 AM Confirm and document reason for call. If symptomatic, describe symptoms. ---Caller states broke back last week at [employer]; States has been to hospital but no BMs since going to hospital Monday monday morning-kept til Tuesday. States she vomited this morning and she vomited her breakfast. Had chest pain yesterday; Also has heartburn; has brace with a metal part hitting her neck. Does the patient have any new or worsening symptoms? ---Yes Will a triage be completed? ---Yes Related visit to physician within the last 2 weeks? ---No Does the PT have any chronic conditions? (i.e. diabetes, asthma, this includes High risk factors for pregnancy, etc.) ---Yes List chronic conditions. ---PPM, heart, HTN, blood thinner Is this a behavioral health or substance abuse call? ---No PLEASE NOTE: All timestamps contained within this report are represented as [place] Standard Time. CONFIDENTIALTY NOTICE: This fax transmission is intended only for the addressee. It contains information that is legally privileged, confidential or otherwise protected from use or disclosure. If you are not the intended recipient, you are strictly prohibited from reviewing, disclosing, copying using or disseminating any of this information or taking any action in reliance on or regarding this information. If you have received this fax in error, please notify us immediately by telephone so that we can arrange for its return to [place]. Phone: [phone], Toll-Free: [phone], Fax: [phone] Page: 2 of 3 Call Id: [id number] Guidelines Guideline Title Affirmed Question Affirmed Notes Nurse Date/Time Eilene Ghazi Time) Spinal Cord Injury - Constipation Last bowel movement (BM) > 4 days ago Otelia Santee 02/03/2021 11:38:39 AM Abdominal Pain - Upper [1] MODERATE pain (e.g., interferes with normal activities) AND [2] comes and  goes (cramps) AND [3] present > 24 hours (Exception: pain with Vomiting or Diarrhea - see that Guideline) Clovis Riley, RN, Georgina Peer 02/03/2021 11:41:31 AM Disp. Time Eilene Ghazi Time) Disposition Final User 02/03/2021 11:32:49 AM Send to Urgent Mikael Spray 02/03/2021 11:41:03 AM See PCP within 24 Hours [place], RN, Georgina Peer 02/03/2021 11:44:59 AM See PCP within 24 Hours Yes Clovis Riley, RN, Leilani Merl Disagree/Comply Comply Caller Understands Yes PreDisposition Home Care Care Advice Given Per Guideline SEE PCP WITHIN 24 HOURS: * IF OFFICE WILL BE OPEN: You need to be examined within the next 24 hours. Call your doctor (or NP/PA) when the office opens and make an appointment. BOWEL MANAGEMENT PROGRAM - RECTAL STIMULANT LAXATIVE: * You can use either bisacodyl (Dulcolax) suppository or a glycerin suppository as a rectal stimulant. These suppositories stimulate the rectum and can help trigger a bowel movement. * Some people with spinal cord injury use a rectal stimulant medicine every day. CARE ADVICE given per Spinal Cord Injury - Constipation (Adult) guideline. CALL BACK IF: * Severe or increasing abdomen pain * Constant abdomen pain lasting over 2 hours * Vomiting occurs * You become worse SEE PCP WITHIN 24 HOURS: * IF OFFICE WILL BE OPEN: You need to be examined within the next 24 hours. Call your doctor (or NP/PA) when the office opens and make an appointment. * IF OFFICE WILL BE CLOSED: You need to be seen within the next 24 hours. A clinic or an urgent care center is often a good source of care if your doctor's office is closed or you can't get an appointment. * IF PATIENT HAS NO PCP: Refer patient to a clinic or urgent care center. Also try to help caller find a PCP for future care. NOTE TO TRIAGER: * Use nurse judgment to select the  most appropriate source of care. ANTACID: * If having pain now, try taking an antacid (e.g., Mylanta, Maalox). * Dose: 2 tablespoons (30 ml) of liquid. TAKE  ANTACIDS: * Try taking an antacid (e.g., Mylanta, Maalox) 1 hour after meals and before bedtime. * Dose: 2 tablespoons (30 ml) of liquid. DRINK CLEAR FLUIDS: * Drink clear fluids only (e.g., water, flat soft drinks or half-strength Gatorade). * Then slowly return to a regular diet. * Sip small amounts at a time, until you feel better and the pain is gone. CALL BACK IF: * Constant pain present over 2 hours * Severe pain present over 1 hour * You become worse CARE ADVICE given per Abdominal Pain, Upper (Adult) guideline. PLEASE NOTE: All timestamps contained within this report are represented as [place] Standard Time. CONFIDENTIALTY NOTICE: This fax transmission is intended only for the addressee. It contains information that is legally privileged, confidential or otherwise protected from use or disclosure. If you are not the intended recipient, you are strictly prohibited from reviewing, disclosing, copying using or disseminating any of this information or taking any action in reliance on or regarding this information. If you have received this fax in error, please notify us immediately by telephone so that we can arrange for its return to [place]. Phone: [phone], Toll-Free: [phone], Fax: [phone] Page: 3 of 3 Call Id: [id number] Referrals REFERRED TO PCP OFFICE

## 2021-02-03 NOTE — Telephone Encounter (Signed)
fyi

## 2021-02-06 ENCOUNTER — Encounter: Payer: Self-pay | Admitting: Family Medicine

## 2021-02-06 ENCOUNTER — Ambulatory Visit: Payer: Medicare Other

## 2021-02-06 ENCOUNTER — Ambulatory Visit (INDEPENDENT_AMBULATORY_CARE_PROVIDER_SITE_OTHER): Payer: Medicare Other | Admitting: Family Medicine

## 2021-02-06 ENCOUNTER — Other Ambulatory Visit: Payer: Self-pay | Admitting: Internal Medicine

## 2021-02-06 ENCOUNTER — Other Ambulatory Visit: Payer: Self-pay

## 2021-02-06 VITALS — BP 142/85 | HR 63 | Temp 97.2°F | Wt 132.0 lb

## 2021-02-06 DIAGNOSIS — Z7901 Long term (current) use of anticoagulants: Secondary | ICD-10-CM | POA: Diagnosis not present

## 2021-02-06 DIAGNOSIS — M8000XD Age-related osteoporosis with current pathological fracture, unspecified site, subsequent encounter for fracture with routine healing: Secondary | ICD-10-CM

## 2021-02-06 DIAGNOSIS — S22000A Wedge compression fracture of unspecified thoracic vertebra, initial encounter for closed fracture: Secondary | ICD-10-CM | POA: Diagnosis not present

## 2021-02-06 DIAGNOSIS — K5903 Drug induced constipation: Secondary | ICD-10-CM

## 2021-02-06 DIAGNOSIS — T402X5A Adverse effect of other opioids, initial encounter: Secondary | ICD-10-CM

## 2021-02-06 DIAGNOSIS — M8000XA Age-related osteoporosis with current pathological fracture, unspecified site, initial encounter for fracture: Secondary | ICD-10-CM | POA: Insufficient documentation

## 2021-02-06 MED ORDER — OXYCODONE HCL ER 10 MG PO T12A: 10.0000 mg | EXTENDED_RELEASE_TABLET | Freq: Every evening | ORAL | 0 refills | Status: DC | PRN

## 2021-02-06 MED ORDER — CALCITONIN (SALMON) 200 UNIT/ACT NA SOLN: 1.0000 | Freq: Every day | NASAL | 0 refills | Status: DC

## 2021-02-06 NOTE — Patient Instructions (Signed)
Please return in 3-4 weeks for recheck.   I have referred you for home health care: nursing, physical therapy and home health aide.  I have ordered a long acting pain medication to be used at night.  You may use the oxycodone from the ER during the day in addition to tylenol.  Use colace and miralax twice a day.  I have referred you to interventional radiology to see if we can get you a kyphoplasty, or medical procedure to help fix or support the back fracture.   If you have any questions or concerns, please don't hesitate to send me a message via MyChart or call the office at [phone]. Thank you for visiting with [place] today! It's our pleasure caring for you.   Balloon Kyphoplasty Balloon kyphoplasty is a procedure to treat a spinal compression fracture, which is a collapse of the bones that form the spine (vertebrae). With this type of fracture, the vertebrae are squeezed (compressed) into a wedge shape, and this causes pain. In this procedure, the collapsed vertebrae are expanded with a balloon. Bone cement is then injected into thevertebrae to strengthen them. Tell a health care provider about: Any allergies you have. All medicines you are taking, including vitamins, herbs, eye drops, creams, and over-the-counter medicines. Any problems you or family members have had with anesthetic medicines. Any blood disorders you have. Any surgeries you have had. Any medical conditions you have or have had. Whether you are pregnant or may be pregnant. What are the risks? Generally, this is a safe procedure. However, problems may occur, including: Infection. Bleeding. Increased back pain. Allergic reactions to medicines. Damage to nearby structures, nerves, or organs. Leaking of bone cement into other parts of the body. What happens before the procedure? Staying hydrated Follow instructions from your health care provider about hydration, which may include: Up to 2 hours before the procedure -  you may continue to drink clear liquids, such as water, clear fruit juice, black coffee, and plain tea.  Eating and drinking restrictions Follow instructions from your health care provider about eating and drinking, which may include: 8 hours before the procedure - stop eating heavy meals or foods, such as meat, fried foods, or fatty foods. 6 hours before the procedure - stop eating light meals or foods, such as toast or cereal. 6 hours before the procedure - stop drinking milk or drinks that contain milk. 2 hours before the procedure - stop drinking clear liquids. Medicines Ask your health care provider about: Changing or stopping your regular medicines. This is especially important if you are taking diabetes medicines or blood thinners. Taking medicines such as aspirin and ibuprofen. These medicines can thin your blood. Do not take these medicines unless your health care provider tells you to take them. Taking over-the-counter medicines, vitamins, herbs, and supplements. General instructions Follow instructions from your health care provider about eating or drinking restrictions. Do not use any products that contain nicotine or tobacco for at least 4 weeks before the procedure. These products include cigarettes, e-cigarettes, and chewing tobacco. If you need help quitting, ask your health care provider. Ask your health care provider: How your surgery site will be marked. What steps will be taken to help prevent infection. These may include: Removing hair at the surgery site. Washing skin with a germ-killing soap. Taking antibiotic medicine. Plan to have someone take you home from the hospital or clinic. If you will be going home right after the procedure, plan to have someone with you  for 24 hours. What happens during the procedure?  An IV will be inserted into one of your veins. You will be given one or more of the following: A medicine to help you relax (sedative). A medicine to numb  the area (local anesthetic). A medicine to make you fall asleep (general anesthetic). Your surgeon will use an X-ray machine to see your spinal compression fracture. A hollow needle will be inserted through the skin and into the spine. Through this needle, the balloon will be placed in your spine where the fractures are located. The balloon will be inflated. This will create space and push the bone back toward its normal height and shape. The balloon will be removed. The newly created space in your spine will be filled with bone cement that will harden quickly. When the cement hardens, the hollow needle will be removed. The needle puncture site will be closed with skin glue or adhesive tape. A bandage (dressing) may be used to cover the needle puncture site. The procedure may vary among health care providers and hospitals. What happens after the procedure? Your blood pressure, heart rate, breathing rate, and blood oxygen level will be monitored until you leave the hospital or clinic. You will have some pain. Pain medicine will be available to help you. An ice pack may be placed over the needle site. If you were given a sedative during the procedure, it can affect you for several hours. Do not drive or operate machinery until your health care provider says that it is safe. Summary Balloon kyphoplasty is a procedure to treat a spinal compression fracture, which is a collapse of the bones that form the spine (vertebrae). Before the procedure, follow instructions from your health care provider about eating or drinking restrictions. Ask your health care provider about changing or stopping your regular medicines. This is especially important if you are taking diabetes medicines or blood thinners. Plan to have someone take you home from the hospital or clinic. If you were given a sedative during the procedure, do not drive or operate machinery until your health care provider says that it is safe. If  you will be going home right after the procedure, plan to have someone with you for 24 hours. This information is not intended to replace advice given to you by your health care provider. Make sure you discuss any questions you have with your healthcare provider. Document Revised: 11/18/2019 Document Reviewed: 11/18/2019 Elsevier Patient Education  [employer].

## 2021-02-06 NOTE — Progress Notes (Signed)
Subjective  CC:  Chief Complaint  Patient presents with   Fall    Fall happened in [place] 6/19, hospital submitted PT referral there - needing [name] to place referral for here    HPI: Deanna Rivera is a 87 y.o. female who presents to the office today to address the problems listed above in the chief complaint. Hospitalized 2 days in [place] after fall; suffered T8 compression fracture. I reviewed records.  Having debilitating pain; only taking oxycodone 5mg  nightly. She helps care for her husband; having difficulties with ADLs due to pain. Difficulty getting to restroom. Limited mobilty. Wearing brace. Poor sleep. And now constipation due to meds. She is tearful. She was offered d/c to rehab unit but declined due to worry about her husband.  Osteopoosis: pt has declined dexa testing and treatment in past. Education given today.    Assessment  1. Compression fracture of body of thoracic vertebra (HCC)   2. Age-related osteoporosis with current pathological fracture with routine healing, subsequent encounter   3. Therapeutic opioid induced constipation   4. Current use of long term anticoagulation      Plan  Compression fracture and osteoporosis::  severe pain: add oxycontin 10 for nighttime. HH referral for aide, nursing, PT. Needs bedside commode. Continue brace for comfort and refer to IR for eval for kyphoplasty. Will need close f/u. At risk for another fall. Trial for nasal calcitonin for pain mgt.  Colace and miralax bid.  Will discuss mgt of osteoporosis after improved from acute fracture.  Today's visit was 43 minutes long. Greater than 50% of this time was devoted to face to face counseling with the patient and coordination of care. We discussed her diagnosis, prognosis, treatment options and treatment plan is documented above. Remainder of visit was spent on chart review and documentation and working with referral coordinator to help keep pt home and living independently as she  desires.   Follow up: 3-4 weeks for recheck.   02/06/2021  Orders Placed This Encounter  Procedures   Ambulatory referral to Interventional Radiology   Ambulatory referral to [place] ordered this encounter  Medications   calcitonin, salmon, (MIACALCIN) 200 UNIT/ACT nasal spray    Sig: Place 1 spray into alternate nostrils daily.    Dispense:  3.7 mL    Refill:  0   oxyCODONE (OXYCONTIN) 10 mg 12 hr tablet    Sig: Take 1 tablet (10 mg total) by mouth at bedtime as needed.    Dispense:  30 tablet    Refill:  0      I reviewed the patients updated PMH, FH, and SocHx.    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    Priority: High   Nontoxic multinodular goiter 09/08/2015    Priority: High   Current use of long term anticoagulation 05/16/2015    Priority: High   (HFpEF) heart failure with preserved ejection fraction ([place]) 09/13/2014    Priority: High   Pacemaker 11/09/2011    Priority: High   Impaired fasting glucose 05/23/2009    Priority: High   Long term current use of systemic steroids 03/30/2008    Priority: High   Polymyalgia rheumatica ([place]) 03/30/2008    Priority: High   Benign essential hypertension 05/05/2007  Priority: High   Persistent atrial fibrillation ([place]) 11/06/2006    Priority: High   Pure hypercholesterolemia 11/06/2006    Priority: High   Hypothyroidism due to Hashimoto's thyroiditis, Rx Levothyroxine 06/25/2005    Priority: High   Primary osteoarthritis of right knee 10/23/2019    Priority: Medium   Nonrheumatic mitral valve regurgitation 09/16/2015    Priority: Medium   Recurrent UTI 03/14/2015    Priority: Medium   Neuralgia, postherpetic 07/02/2019    Priority: Low   OAB (overactive bladder) 07/02/2019    Priority: Low   Constipation  01/18/2019    Priority: Low   Age-related osteoporosis with current pathological fracture 02/06/2021   Compression fracture of body of thoracic vertebra ([place]) 02/06/2021   Shellfish allergy 11/22/2015   Synovial cyst of popliteal space 11/22/2008   Current Meds  Medication Sig   calcitonin, salmon, (MIACALCIN) 200 UNIT/ACT nasal spray Place 1 spray into alternate nostrils daily.   dabigatran (PRADAXA) 150 MG CAPS capsule TAKE 1 CAPSULE BY MOUTH  EVERY 12 HOURS (Patient taking differently: Take 150 mg by mouth 2 (two) times daily.)   digoxin (LANOXIN) 0.125 MG tablet Take 1 tablet (0.125 mg total) by mouth daily.   docusate sodium (COLACE) 100 MG capsule Take 100 mg by mouth 2 (two) times daily.   hydrochlorothiazide (HYDRODIURIL) 25 MG tablet TAKE 1 TABLET BY MOUTH  DAILY (Patient taking differently: Take 12.5 mg by mouth daily.)   latanoprost (XALATAN) 0.005 % ophthalmic solution Place 1 drop into both eyes at bedtime.   levothyroxine (SYNTHROID) 112 MCG tablet TAKE 1 TABLET BY MOUTH  DAILY (Patient taking differently: Take 112 mcg by mouth daily before breakfast.)   LUMIGAN 0.01 % SOLN Place 1 drop into both eyes at bedtime.   Metoprolol Succinate 50 MG CS24 Take 50 mg by mouth daily.   ondansetron (ZOFRAN ODT) 4 MG disintegrating tablet Take 1 tablet (4 mg total) by mouth every 8 (eight) hours as needed for nausea or vomiting.   oxyCODONE (OXYCONTIN) 10 mg 12 hr tablet Take 1 tablet (10 mg total) by mouth at bedtime as needed.   oxyCODONE (ROXICODONE) 5 MG immediate release tablet Take 5 mg by mouth daily.   predniSONE (DELTASONE) 1 MG tablet TAKE 1 TABLET BY MOUTH  DAILY (Patient taking differently: Take 1 mg by mouth daily with breakfast.)    Allergies: Patient is allergic to monosodium glutamate, ciprofloxacin, epinephrine, erythromycin, shellfish allergy, sulfamethoxazole-trimethoprim, and tetracyclines & related. Family History: Patient family history includes Cirrhosis (age of  onset: 93) in her father; Early death (age of onset: 50) in her mother; Hypertension in her brother; Lung cancer (age of onset: 93) in her brother; Scleroderma in her daughter. Social History:  Patient  reports that she has never smoked. She has never used smokeless tobacco. She reports that she does not drink alcohol and does not use drugs.  Review of Systems: Constitutional: Negative for fever malaise or anorexia Cardiovascular: negative for chest pain Respiratory: negative for SOB or persistent cough Gastrointestinal: negative for abdominal pain  Objective  Vitals: BP (!) 142/85   Pulse 63   Temp (!) 97.2 F (36.2 C) (Temporal)   Wt 132 lb (59.9 kg)   SpO2 97%   BMI 22.66 kg/m  General: teaful and uncomfortable .wearing brace; A&Ox3     Commons side effects, risks, benefits, and alternatives for medications and treatment plan prescribed today were discussed, and the patient expressed understanding of the given instructions. Patient is instructed to call or  message via MyChart if he/she has any questions or concerns regarding our treatment plan. No barriers to understanding were identified. We discussed Red Flag symptoms and signs in detail. Patient expressed understanding regarding what to do in case of urgent or emergency type symptoms.  Medication list was reconciled, printed and provided to the patient in AVS. Patient instructions and summary information was reviewed with the patient as documented in the AVS. This note was prepared with assistance of Dragon voice recognition software. Occasional wrong-word or sound-a-like substitutions may have occurred due to the inherent limitations of voice recognition software  This visit occurred during the SARS-CoV-2 public health emergency.  Safety protocols were in place, including screening questions prior to the visit, additional usage of staff PPE, and extensive cleaning of exam room while observing appropriate contact time as indicated  for disinfecting solutions.

## 2021-02-07 ENCOUNTER — Telehealth: Payer: Self-pay

## 2021-02-07 NOTE — Telephone Encounter (Signed)
Otila Kluver is out of the office, will be back on 7/1. I will check with Otila Kluver and get back with pt.

## 2021-02-07 NOTE — Telephone Encounter (Signed)
.  Home Health verbal orders-caller/Agency: [facility]   Callback number: [phone]  Requesting OT/PT/Skilled nursing/Social Work/Speech: PT   Frequency: 2x week for 4 weeks

## 2021-02-07 NOTE — Telephone Encounter (Signed)
Patient called in stated that she see's in her mychart  after visit summary for medicare wellness visit she stated she did not do that visit and would like that out of her chart. Patient was very upset and doesn't understand why it is in her chart. Please advise.

## 2021-02-07 NOTE — Telephone Encounter (Signed)
Spoke with Deanna Rivera about this situation. Pt was seen only by [name] and not for the AWV. Deanna Rivera canceled the Check in for the AWV but didn't cancel the visit; that's why it showed on pt's chart as a visit. I canceled the AWV from yesterday and called pt and explained the situation; pt express that she was bothered by the fact that the AVS said "see you in 1 yr" when she wasn't seen by Deanna Rivera and she didn't wasn't to be billed for the visit. Explained that she will not be billed for the AWV since it was not completed; asked her to take a look at her Mychart and let me know if it's fixed from her view after I canceled it. She said ok and hung up.

## 2021-02-08 ENCOUNTER — Other Ambulatory Visit: Payer: Self-pay | Admitting: Family Medicine

## 2021-02-08 ENCOUNTER — Telehealth: Payer: Self-pay

## 2021-02-08 ENCOUNTER — Telehealth: Payer: Self-pay | Admitting: Internal Medicine

## 2021-02-08 ENCOUNTER — Ambulatory Visit (INDEPENDENT_AMBULATORY_CARE_PROVIDER_SITE_OTHER): Payer: Medicare Other

## 2021-02-08 DIAGNOSIS — I495 Sick sinus syndrome: Secondary | ICD-10-CM

## 2021-02-08 LAB — CUP PACEART REMOTE DEVICE CHECK
Battery Remaining Longevity: 162 mo
Battery Remaining Percentage: 100 %
Brady Statistic RA Percent Paced: 0 %
Brady Statistic RV Percent Paced: 71 %
Date Time Interrogation Session: 20220629064100
Implantable Lead Implant Date: 20130305
Implantable Lead Implant Date: 20130305
Implantable Lead Location: 753859
Implantable Lead Location: 753860
Implantable Lead Model: 4469
Implantable Lead Model: 4470
Implantable Lead Serial Number: 546229
Implantable Lead Serial Number: 712016
Implantable Pulse Generator Implant Date: 20200409
Lead Channel Impedance Value: 421 Ohm
Lead Channel Pacing Threshold Amplitude: 0.6 V
Lead Channel Pacing Threshold Pulse Width: 0.4 ms
Lead Channel Setting Pacing Amplitude: 1.1 V
Lead Channel Setting Pacing Pulse Width: 0.4 ms
Lead Channel Setting Sensing Sensitivity: 0.6 mV
Pulse Gen Serial Number: 859007

## 2021-02-08 NOTE — Telephone Encounter (Addendum)
Advised the patient that she needed to have the results faxed to the office because they are not linked into epic. [place] fax number. Verbalized understanding.

## 2021-02-08 NOTE — Telephone Encounter (Signed)
PT is calling she broke her back and already had a ECHO done and wants the doctor to look at the results

## 2021-02-08 NOTE — Telephone Encounter (Signed)
OK verbal orders given

## 2021-02-08 NOTE — Telephone Encounter (Signed)
Spoke with patient, advised that PCP recommends taking both pain medications. Patient states that the pharmacist told her that it was a high dose of oxyxodone and she strongly recommended against filling medication.

## 2021-02-08 NOTE — Telephone Encounter (Signed)
Patient is calling in stating that the Oxycodone was prescribed for her (205 mg in AM and 10 mg in the PM) states that she does not feel comfortable with taking 10 MG at night and is wondering what other dose she can take safer for her.

## 2021-02-10 ENCOUNTER — Other Ambulatory Visit: Payer: Self-pay | Admitting: Family Medicine

## 2021-02-10 DIAGNOSIS — S22000A Wedge compression fracture of unspecified thoracic vertebra, initial encounter for closed fracture: Secondary | ICD-10-CM

## 2021-02-10 NOTE — Telephone Encounter (Signed)
Received a call from [facility] they state they got a hold of the patient and explained that the supplies were not going to be covered by insurance. Deanna Rivera was very confused, but after speaking with the caretaker they are going to pick up the supplies later today. Just an FYI to [place].

## 2021-02-10 NOTE — Telephone Encounter (Signed)
Please contact her home health agency: needs bedside commode brought to her today please. Order it if needed. [facility]; [phone]  Spoke with patient. Taking pain med at night which helps. Still struggling with ambulation. No new sxs. Frustrated.  Difficult circumstances.   Will f/u on IR referral for kyphoplasty as well.

## 2021-02-10 NOTE — Telephone Encounter (Signed)
[facility] does not do DME supplies.  Contacted [facility] and they are contacting patient to hopefully get bedside commode delivered today

## 2021-02-10 NOTE — Telephone Encounter (Signed)
Noted  

## 2021-02-15 ENCOUNTER — Other Ambulatory Visit (HOSPITAL_COMMUNITY): Payer: Medicare Other

## 2021-02-15 ENCOUNTER — Telehealth: Payer: Self-pay

## 2021-02-15 NOTE — Telephone Encounter (Signed)
Patient is calling in stating that the Oxycodone was prescribed for her (10 mg in the PM) states that she does not feel comfortable with taking 10 MG at night and is wondering what other dose she can take safer for her.

## 2021-02-15 NOTE — Telephone Encounter (Signed)
The type of medicine she was prescribed was a 12-hour tablet-typically oxycodone leaves the system within 6 to 8 hours so this is roughly equivalent to a 5 mg dose every 6 hours-[name] was aware of her medical history and thought this was a reasonable dose-I am okay with patient taking this as long as she is not having significant side effects-she also does not have to take medicine unless needs to-this was written as as needed

## 2021-02-15 NOTE — Telephone Encounter (Signed)
Please see message and advise 

## 2021-02-16 ENCOUNTER — Telehealth: Payer: Self-pay

## 2021-02-16 NOTE — Telephone Encounter (Signed)
10 mg oxycontin should not be cut in half.   She was given the following script previously 01/31/2021  01/31/2021   1  Oxycodone Hcl (Ir) 5 Mg Tablet  12.00  6  [facility]  [id number]  Car (8171)  0/0  15.00 MME  [facility]  [facility]   Per Thurmond Butts- is she out of that medication- how often is she taking that medicine (the attempted 2.5 mg). I do not believe there is a 2.5 mg pill so would have to split a 5 mg dose of instant release

## 2021-02-16 NOTE — Telephone Encounter (Signed)
Pt returned call and states that she does not want to take neither the 10mg  or 5 mg of Oxycodone, she wants to only take 2.5mg . she has been cutting her 10mg  tablets to try and get 2.5mg  out of it so she would like a new rx for Oxycodone 2.5mg  so she does not have to continue to cut the 10mg  tablets.

## 2021-02-16 NOTE — Telephone Encounter (Signed)
Patient called stating that she talked to [facility] that there was going to be another dose of oxycodone called into the pharmacy today. I don't see anything being called in today. The  last refill was 6/27 and she is asking for more after she spoke with keba this morning she states there was suppose to be another dose sent in. Please call patient and advise  Thank you.

## 2021-02-16 NOTE — Telephone Encounter (Signed)
Called and lm for pt tcb. 

## 2021-02-16 NOTE — Telephone Encounter (Signed)
Does she have any 5 mg pills left?  How many?  I would be open to refilling this if she is avoiding the 10 mg OxyContin

## 2021-02-16 NOTE — Telephone Encounter (Signed)
Called and spoke with pt and she states she takes half every night and tylenol during the day. She states she is fine with cutting the 5mg  in half but the 10mg  was to much for her. Pt states she just wants to heal and not become addicted to Oxycodone.

## 2021-02-17 MED ORDER — OXYCODONE HCL 5 MG PO TABS: 2.5000 mg | ORAL_TABLET | Freq: Four times a day (QID) | ORAL | 0 refills | Status: DC | PRN

## 2021-02-17 NOTE — Telephone Encounter (Signed)
Called and made pt aware.

## 2021-02-17 NOTE — Telephone Encounter (Signed)
Called and spoke with pt and she states she does not have any of the 5mg  tab left that [name] had Rx'd 3 weeks ago. She has just been cutting the 10mg  but she does not like doing that.

## 2021-02-17 NOTE — Telephone Encounter (Signed)
See other message

## 2021-02-17 NOTE — Telephone Encounter (Signed)
I sent in a small quantity of the 5 mg for her-continue to use half tablet at night-consider scheduling follow-up with [name] in the next week or 2

## 2021-02-17 NOTE — Addendum Note (Signed)
Addended by: Marin Olp on: 02/17/2021 10:39 AM   Modules accepted: Orders

## 2021-02-28 NOTE — Progress Notes (Signed)
Remote pacemaker transmission.   

## 2021-03-01 ENCOUNTER — Other Ambulatory Visit: Payer: Self-pay | Admitting: Family Medicine

## 2021-03-01 ENCOUNTER — Telehealth: Payer: Self-pay

## 2021-03-01 ENCOUNTER — Ambulatory Visit
Admission: RE | Admit: 2021-03-01 | Discharge: 2021-03-01 | Disposition: A | Discharge status: Home / Self Care | Payer: Self-pay | Source: Ambulatory Visit | Attending: Family Medicine | Admitting: Family Medicine

## 2021-03-01 ENCOUNTER — Other Ambulatory Visit: Payer: Self-pay | Admitting: Interventional Radiology

## 2021-03-01 DIAGNOSIS — R52 Pain, unspecified: Secondary | ICD-10-CM

## 2021-03-01 DIAGNOSIS — S22000A Wedge compression fracture of unspecified thoracic vertebra, initial encounter for closed fracture: Secondary | ICD-10-CM

## 2021-03-01 NOTE — Telephone Encounter (Signed)
Spoke to pt told her scheduled an appt for next Wed 7/27 at 3:00 PM with [name]. Pt verbalized understanding and said yes it came up on my chart. Told pt okay she will discuss things then with you. Pt verbalized understanding.

## 2021-03-01 NOTE — Telephone Encounter (Signed)
Spoke to pt asked her how can I help you? Pt said she is wanting to know about x-ray of her back that was suppose to be ordered by [name] and about seeing and orthopedics. Told pt I see order for imaging has you not been contacted? Pt said no. Also looks like you were suppose to make an appt with [name] with in 2 weeks when you pain medicine was called in. Did you schedule? Pt said no. Told pt I will have to check with our referral coordinator about you x-ray and also I will see about an appt for you. What day is good for you? Pt said Tues, Wed or Thurs. Told her okay let me look into these and I will call you back. Pt verbalized understanding.

## 2021-03-01 NOTE — Telephone Encounter (Signed)
Patient has some questions, regarding medications and other things. Can someone give her a call?

## 2021-03-01 NOTE — Telephone Encounter (Signed)
We received the disc today that GI needed in order to get the patient scheduled- I took that to them and contacted the patient to let her know GI will be reaching out for scheduling

## 2021-03-06 ENCOUNTER — Telehealth: Payer: Self-pay | Admitting: Internal Medicine

## 2021-03-06 ENCOUNTER — Other Ambulatory Visit: Payer: Self-pay | Admitting: Family Medicine

## 2021-03-06 NOTE — Telephone Encounter (Signed)
Left message to call back  

## 2021-03-06 NOTE — Telephone Encounter (Signed)
Gave patient same advisement from 6/29.   Advised the patient that she needed to have the results faxed to the office because they are not linked into epic. [place] fax number. Verbalized understanding.

## 2021-03-06 NOTE — Telephone Encounter (Signed)
Patient is asking that the nurse give her a call in regardless to echo that she already had done at [facility] on June 20th. She wants to know if that test the dr can use instead of her having another one done. Please advise

## 2021-03-08 ENCOUNTER — Encounter: Payer: Self-pay | Admitting: Family Medicine

## 2021-03-08 ENCOUNTER — Ambulatory Visit (INDEPENDENT_AMBULATORY_CARE_PROVIDER_SITE_OTHER): Payer: Medicare Other | Admitting: Family Medicine

## 2021-03-08 ENCOUNTER — Other Ambulatory Visit: Payer: Self-pay

## 2021-03-08 VITALS — BP 151/80 | HR 64 | Temp 97.2°F | Wt 132.0 lb

## 2021-03-08 DIAGNOSIS — S22000A Wedge compression fracture of unspecified thoracic vertebra, initial encounter for closed fracture: Secondary | ICD-10-CM

## 2021-03-08 DIAGNOSIS — M353 Polymyalgia rheumatica: Secondary | ICD-10-CM

## 2021-03-08 DIAGNOSIS — I5032 Chronic diastolic (congestive) heart failure: Secondary | ICD-10-CM

## 2021-03-08 DIAGNOSIS — M8000XD Age-related osteoporosis with current pathological fracture, unspecified site, subsequent encounter for fracture with routine healing: Secondary | ICD-10-CM

## 2021-03-08 DIAGNOSIS — Z7952 Long term (current) use of systemic steroids: Secondary | ICD-10-CM | POA: Diagnosis not present

## 2021-03-08 NOTE — Patient Instructions (Addendum)
Please return in 4 weeks to recheck compression fracture, and blood pressure.   Please go to our [place] office to get your xrays done a few days prior to your next appointment. You can walk in M-F between 8:30am- noon or 1pm - 5pm. Tell them you are there for xrays ordered by me. They will send me the results, then I will let you know the results with instructions.   Address: [address].  The Xray department is located in the basement.   You may stop the oxycodone and calcitonin nasal spray. You may stop wearing the brace.  Please use Tylenol 1-2 tabs every 4-6 hours as needed for pain.   Continue your physical therapy for another month.  I have placed a referral to endocrinology for evaluation regarding your thin bones and treatment options. We will call you to get you scheduled.   If you have any questions or concerns, please don't hesitate to send me a message via MyChart or call the office at [phone]. Thank you for visiting with [place] today! It's our pleasure caring for you.

## 2021-03-08 NOTE — Progress Notes (Signed)
Subjective  CC:  Chief Complaint  Patient presents with   Back Pain    Right middle side of her back mostly in the afternoon. Takes half of a tylenol to help with pain. Is almost out of oxyxodone - wanting to wean off completely   States that she has a 4 hour bone scan scheduled, wanting to know if she still needs this even though she has the procedure to fix her back coming up   medication review    Wanting to know if her nasal spray is a long term medication or if she can stop using it    Depression    Noticed after her accident, is crying a lot     HPI: Deanna Rivera is a 87 y.o. female who presents to the office today to address the problems listed above in the chief complaint. 87 year old with T8 compression fracture approximately 1 month ago.  Here for follow-up.  Fortunately, overall she is doing better.  No longer requiring regular oxycodone.  Taking 2.5 mg each night.  During the day she is taking one half of a regular Tylenol.  She continues to use the brace but finds it uncomfortable.  She is sleeping well.  She continues the calcitonin nasal spray.  Unfortunately, she has not had an interventional radiology consultation.  It took a long time for the CT scan results to get here.  No new pain.  No new gait abnormalities.  She is stressed and down due to her limitations because of her fracture. Osteoporosis: Age-related and due to chronic steroid use for PMR.  In the past she has declined further screenings or treatment for osteoporosis but now she is open to further evaluation and discussion do this painful compression fracture.  Assessment  1. Compression fracture of body of thoracic vertebra (HCC)   2. Chronic heart failure with preserved ejection fraction ([place])   3. Polymyalgia rheumatica ([place])   4. Long term current use of systemic steroids   5. Age-related osteoporosis with current pathological fracture with routine healing, subsequent encounter      Plan  Compression  fracture thoracic vertebra: Fortunately, improving.  Please stop calcitonin spray.  Stop wearing the brace continuously.  May use as needed.  Continue physical therapy and movement.  Core strengthening recommended.  Stop oxycodone.  Use Tylenol for pain control.  Follow-up in 1 month.  X-rays ordered prior to that visit.  Education counseling given at length today.  Reassured.  Counseling done for mood Osteoporosis: Refer to endocrinology for evaluation of osteoporosis and treatment options.  Follow up: 1 month to recheck 04/05/2021  Orders Placed This Encounter  Procedures   Ambulatory referral to Endocrinology   No orders of the defined types were placed in this encounter.     I reviewed the patients updated PMH, FH, and SocHx.    Patient Active Problem List   Diagnosis Date Noted   Age-related osteoporosis with current pathological fracture 02/06/2021    Priority: High   SSS (sick sinus syndrome) ([place]) 02/11/2019    Priority: High   Status post placement of cardiac pacemaker 07/08/2018    Priority: High   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    Priority: High   Nontoxic multinodular goiter 09/08/2015    Priority: High   Current use of long term anticoagulation 05/16/2015    Priority: High   (HFpEF) heart failure with preserved ejection fraction ([place]) 09/13/2014    Priority: High   Pacemaker 11/09/2011  Priority: High   Impaired fasting glucose 05/23/2009    Priority: High   Long term current use of systemic steroids 03/30/2008    Priority: High   Polymyalgia rheumatica ([place]) 03/30/2008    Priority: High   Benign essential hypertension 05/05/2007    Priority: High   Persistent atrial fibrillation ([place]) 11/06/2006    Priority: High   Pure hypercholesterolemia 11/06/2006    Priority: High   Hypothyroidism due to Hashimoto's thyroiditis, Rx Levothyroxine 06/25/2005    Priority: High   Primary osteoarthritis of right knee 10/23/2019    Priority: Medium   Nonrheumatic  mitral valve regurgitation 09/16/2015    Priority: Medium   Recurrent UTI 03/14/2015    Priority: Medium   Neuralgia, postherpetic 07/02/2019    Priority: Low   OAB (overactive bladder) 07/02/2019    Priority: Low   Constipation 01/18/2019    Priority: Low   Compression fracture of body of thoracic vertebra ([place]) 02/06/2021   Shellfish allergy 11/22/2015   Synovial cyst of popliteal space 11/22/2008   Current Meds  Medication Sig   calcitonin, salmon, (MIACALCIN/FORTICAL) 200 UNIT/ACT nasal spray PLACE 1 SPRAY INTO ALTERNATING NOSTRILS DAILY   dabigatran (PRADAXA) 150 MG CAPS capsule TAKE 1 CAPSULE BY MOUTH  EVERY 12 HOURS   digoxin (LANOXIN) 0.125 MG tablet Take 1 tablet (0.125 mg total) by mouth daily.   docusate sodium (COLACE) 100 MG capsule Take 100 mg by mouth 2 (two) times daily.   hydrochlorothiazide (HYDRODIURIL) 25 MG tablet TAKE 1 TABLET BY MOUTH  DAILY (Patient taking differently: Take 12.5 mg by mouth daily.)   latanoprost (XALATAN) 0.005 % ophthalmic solution Place 1 drop into both eyes at bedtime.   levothyroxine (SYNTHROID) 112 MCG tablet TAKE 1 TABLET BY MOUTH  DAILY (Patient taking differently: Take 112 mcg by mouth daily before breakfast.)   LUMIGAN 0.01 % SOLN Place 1 drop into both eyes at bedtime.   Metoprolol Succinate 50 MG CS24 Take 50 mg by mouth daily.   ondansetron (ZOFRAN ODT) 4 MG disintegrating tablet Take 1 tablet (4 mg total) by mouth every 8 (eight) hours as needed for nausea or vomiting.   oxyCODONE (ROXICODONE) 5 MG immediate release tablet Take 0.5-1 tablets (2.5-5 mg total) by mouth every 6 (six) hours as needed for severe pain.   predniSONE (DELTASONE) 1 MG tablet TAKE 1 TABLET BY MOUTH  DAILY (Patient taking differently: Take 1 mg by mouth daily with breakfast.)    Allergies: Patient is allergic to monosodium glutamate, ciprofloxacin, epinephrine, erythromycin, shellfish allergy, sulfamethoxazole-trimethoprim, and tetracyclines & related. Family  History: Patient family history includes Cirrhosis (age of onset: 58) in her father; Early death (age of onset: 60) in her mother; Hypertension in her brother; Lung cancer (age of onset: 74) in her brother; Scleroderma in her daughter. Social History:  Patient  reports that she has never smoked. She has never used smokeless tobacco. She reports that she does not drink alcohol and does not use drugs.  Review of Systems: Constitutional: Negative for fever malaise or anorexia Cardiovascular: negative for chest pain Respiratory: negative for SOB or persistent cough Gastrointestinal: negative for abdominal pain  Objective  Vitals: BP (!) 151/80   Pulse 64   Temp (!) 97.2 F (36.2 C) (Temporal)   Wt 132 lb (59.9 kg)   SpO2 98%   BMI 22.66 kg/m  General: no acute distress , A&Ox3 Cardiovascular:  RRR without murmur or gallop.  Respiratory:  Good breath sounds bilaterally, CTAB with normal respiratory effort  Skin:  Warm, no rashes T-spine: Persistent tenderness over lower thoracic spine but able to stand and walk with walker unassisted.  Appears mostly comfortable.    Commons side effects, risks, benefits, and alternatives for medications and treatment plan prescribed today were discussed, and the patient expressed understanding of the given instructions. Patient is instructed to call or message via MyChart if he/she has any questions or concerns regarding our treatment plan. No barriers to understanding were identified. We discussed Red Flag symptoms and signs in detail. Patient expressed understanding regarding what to do in case of urgent or emergency type symptoms.  Medication list was reconciled, printed and provided to the patient in AVS. Patient instructions and summary information was reviewed with the patient as documented in the AVS. This note was prepared with assistance of Dragon voice recognition software. Occasional wrong-word or sound-a-like substitutions may have occurred due to  the inherent limitations of voice recognition software  This visit occurred during the SARS-CoV-2 public health emergency.  Safety protocols were in place, including screening questions prior to the visit, additional usage of staff PPE, and extensive cleaning of exam room while observing appropriate contact time as indicated for disinfecting solutions.

## 2021-03-13 ENCOUNTER — Other Ambulatory Visit (HOSPITAL_COMMUNITY): Payer: Medicare Other

## 2021-03-13 ENCOUNTER — Encounter (HOSPITAL_COMMUNITY): Payer: Medicare Other

## 2021-03-15 ENCOUNTER — Other Ambulatory Visit: Payer: Medicare Other

## 2021-03-24 ENCOUNTER — Telehealth: Payer: Self-pay

## 2021-03-24 NOTE — Telephone Encounter (Signed)
Dr.Andy, please see message and advise.

## 2021-03-24 NOTE — Telephone Encounter (Signed)
Pt called stating that she has not been sleeping for the past 3 days and she stated that she needs to speak to a nurse. Please Advise.

## 2021-03-24 NOTE — Telephone Encounter (Signed)
Spoke to pt she said she has not slept in 3 days and needs something to help sleep. She said [name] gave her Oxycodone to help with pain and sleep, but she is out. Told pt [name] has left for the day and will be back on Monday. All I can suggest is that you try OTC Melatonin or some sleep aide. If you are unable to sleep and can't wait till Monday you can go to an Urgent care to be evaluated. I will send message to [name] but will not respond till Monday. Pt verbalized understanding.

## 2021-03-26 MED ORDER — TRAZODONE HCL 50 MG PO TABS: 25.0000 mg | ORAL_TABLET | Freq: Every evening | ORAL | 3 refills | Status: DC | PRN

## 2021-03-26 NOTE — Telephone Encounter (Signed)
Ordered trazadone for pt. This is a sleep medicine and should help. Take 1/2-1 tab nighlty.   Please notify pt.

## 2021-03-27 NOTE — Telephone Encounter (Signed)
Spoke to pt told her [name] ordered Trazadone to help you sleep. Pt said yes I saw it came up on my chart. Asked pt if she picked it up yet? Pt said no, she has to wait for her family to get it. Told pt okay, start ou with 1/2 tablet if that helps then do not increase if not then can increase to one full tablet. Pt verbalized understanding. Told her to make sure keep appt on 8/24 with [name]. Pt verbalized understanding.

## 2021-03-28 ENCOUNTER — Ambulatory Visit (INDEPENDENT_AMBULATORY_CARE_PROVIDER_SITE_OTHER)
Admission: RE | Admit: 2021-03-28 | Discharge: 2021-03-28 | Disposition: A | Discharge status: Home / Self Care | Payer: Medicare Other | Source: Ambulatory Visit | Attending: Family Medicine | Admitting: Family Medicine

## 2021-03-28 ENCOUNTER — Other Ambulatory Visit: Payer: Self-pay | Admitting: Family Medicine

## 2021-03-28 ENCOUNTER — Other Ambulatory Visit: Payer: Self-pay

## 2021-03-28 DIAGNOSIS — S22000A Wedge compression fracture of unspecified thoracic vertebra, initial encounter for closed fracture: Secondary | ICD-10-CM | POA: Diagnosis not present

## 2021-03-29 ENCOUNTER — Telehealth: Payer: Self-pay

## 2021-03-29 DIAGNOSIS — S22000A Wedge compression fracture of unspecified thoracic vertebra, initial encounter for closed fracture: Secondary | ICD-10-CM

## 2021-03-29 NOTE — Telephone Encounter (Signed)
Can someone comment on the new spinal xray so I can let patient know?

## 2021-03-29 NOTE — Telephone Encounter (Signed)
Prior fracture appears stable to slightly worse-can certainly offer sports medicine or orthopedic referral under compression fracture

## 2021-03-29 NOTE — Telephone Encounter (Signed)
Pt called to get X ray results

## 2021-03-30 NOTE — Telephone Encounter (Signed)
Left message on voicemail to call office.  

## 2021-03-30 NOTE — Telephone Encounter (Signed)
Spoke to pt told her Dr. Yong Channel reviewed your x-ray and said Prior fracture appears stable to slightly worse-can certainly offer sports medicine or orthopedic referral under compression fracture. Pt verbalized understanding and stated she does not understand why it is not healing. Pt said she was suppose to have an appt at [facility] but could not get there. She is wondering if they do virtual appointments cause she would like there opinion. Told her I am not sure she could call and ask. Pt said she would like to see a Neurosurgeon. Told her I am not sure how quickly you would get in with them. Pt said to put referrals in for both Ortho and Neurosurgeon and she will decide which she wants to see. Told her okay I will do that and someone will contact you to schedule appointments . Pt verbalized understanding. Referral placed in Epic.

## 2021-03-30 NOTE — Addendum Note (Signed)
Addended by: Marian Sorrow on: 03/30/2021 03:36 PM   Modules accepted: Orders

## 2021-03-31 ENCOUNTER — Telehealth: Payer: Self-pay

## 2021-03-31 NOTE — Telephone Encounter (Signed)
Pt called stating that she does not need the referral now. She stated that [facility] called her and they do not the referral.

## 2021-03-31 NOTE — Telephone Encounter (Signed)
Patient is requesting a referral to a neurosurgeon,

## 2021-03-31 NOTE — Telephone Encounter (Signed)
Noted, closing encounter.

## 2021-04-04 ENCOUNTER — Ambulatory Visit: Payer: Medicare Other | Admitting: Family Medicine

## 2021-04-05 ENCOUNTER — Ambulatory Visit: Payer: Medicare Other | Admitting: Family Medicine

## 2021-04-11 ENCOUNTER — Ambulatory Visit: Payer: Medicare Other | Admitting: Physician Assistant

## 2021-04-16 NOTE — Progress Notes (Signed)
Chart reviewed: pt has been referred to ortho or NS to help manage compression fracutre.

## 2021-04-19 ENCOUNTER — Encounter: Payer: Self-pay | Admitting: Physician Assistant

## 2021-04-19 ENCOUNTER — Other Ambulatory Visit: Payer: Self-pay

## 2021-04-19 ENCOUNTER — Ambulatory Visit (INDEPENDENT_AMBULATORY_CARE_PROVIDER_SITE_OTHER): Payer: Medicare Other | Admitting: Physician Assistant

## 2021-04-19 VITALS — BP 131/77 | HR 63 | Ht 64.0 in | Wt 131.2 lb

## 2021-04-19 DIAGNOSIS — K5909 Other constipation: Secondary | ICD-10-CM

## 2021-04-19 DIAGNOSIS — S22000A Wedge compression fracture of unspecified thoracic vertebra, initial encounter for closed fracture: Secondary | ICD-10-CM

## 2021-04-19 NOTE — Patient Instructions (Signed)
Good to meet you today. Please go to [facility] for outpatient XRAY of your abdomen. Pending results, will discuss plan from there. Continue prunes and Miralax daily. Push fluids. Continue f/up with your neurosurgeon.

## 2021-04-19 NOTE — Progress Notes (Signed)
 Acute Office Visit  Subjective:    Patient ID: Deanna Rivera, female    DOB: 06/25/1933, 87 y.o.   MRN: [id number]  Chief Complaint  Patient presents with   Referral    GI   Vitamin D   Back Pain    HPI Patient is in today for several concerns.  T8 compression fracture - last office visit noted in chart is with Dr. Marvin, neurosurgeon. States she met with another neurosurgeon since then and he advised surgery in 4 weeks if the pain and fracture worsens. She is not wearing a back brace anymore.   Constipation - States she takes prunes and Miralax. Feels like she can never fully get emptied. Occasionally having incontinence of stool in the last three months. No blood in the stool that she can see. No associated abdominal pain, diarrhea, rectal pain, N/V, or weight loss. Appetite is still normal.  Also complains of urinary incontinence starting about 6 months ago. Wears pads all the time and has to change them several times daily.   Past Medical History:  Diagnosis Date   Cataract    Hypertension    Moderate aortic stenosis    Moderate mitral regurgitation    Persistent atrial fibrillation (HCC)    PMR (polymyalgia rheumatica) (HCC)    Second degree AV block    Sick sinus syndrome (HCC)    Thyroid disease     Past Surgical History:  Procedure Laterality Date   ABDOMINAL HYSTERECTOMY     CESAREAN SECTION     CHOLECYSTECTOMY     PACEMAKER GENERATOR CHANGE  11/20/2018   [employer] Accolade MRI PPM implanted in [place]   PACEMAKER IMPLANT     PILONIDAL CYST EXCISION      Family History  Problem Relation Age of Onset   Early death Mother 38   Hypertension Brother    Cirrhosis Father 50   Lung cancer Brother 70   Scleroderma Daughter     Social History   Socioeconomic History   Marital status: Married    Spouse name: Not on file   Number of children: Not on file   Years of education: Not on file   Highest education level: Not on file  Occupational  History   Occupation: Retired     Comment: Kindergarten Teacher   Tobacco Use   Smoking status: Never   Smokeless tobacco: Never  Substance and Sexual Activity   Alcohol use: No   Drug use: No   Sexual activity: Not Currently    Birth control/protection: Post-menopausal  Other Topics Concern   Not on file  Social History Narrative   Recently moved from [place]. In independent living with her husband. Daughter died from scleroderma complications. Hx of falls, but last was > 1.5 years ago. 01/18/2019    Social Determinants of Health   Financial Resource Strain: Not on file  Food Insecurity: Not on file  Transportation Needs: Not on file  Physical Activity: Not on file  Stress: Not on file  Social Connections: Not on file  Intimate Partner Violence: Not on file    Outpatient Medications Prior to Visit  Medication Sig Dispense Refill   dabigatran (PRADAXA) 150 MG CAPS capsule TAKE 1 CAPSULE BY MOUTH  EVERY 12 HOURS 180 capsule 1   digoxin (LANOXIN) 0.125 MG tablet Take 1 tablet (0.125 mg total) by mouth daily. 90 tablet 3   hydrochlorothiazide (HYDRODIURIL) 25 MG tablet TAKE 1 TABLET BY MOUTH  DAILY (Patient taking differently: Take 12.5   mg by mouth daily.) 90 tablet 3   latanoprost (XALATAN) 0.005 % ophthalmic solution Place 1 drop into both eyes at bedtime.     levothyroxine (SYNTHROID) 112 MCG tablet TAKE 1 TABLET BY MOUTH  DAILY (Patient taking differently: Take 112 mcg by mouth daily before breakfast.) 90 tablet 3   LUMIGAN 0.01 % SOLN Place 1 drop into both eyes at bedtime.     Metoprolol Succinate 50 MG CS24 Take 50 mg by mouth daily. 90 capsule 2   ondansetron (ZOFRAN ODT) 4 MG disintegrating tablet Take 1 tablet (4 mg total) by mouth every 8 (eight) hours as needed for nausea or vomiting. 20 tablet 0   predniSONE (DELTASONE) 1 MG tablet TAKE 1 TABLET BY MOUTH  DAILY (Patient taking differently: Take 1 mg by mouth daily with breakfast.) 90 tablet 3   traZODone (DESYREL) 50  MG tablet Take 0.5-1 tablets (25-50 mg total) by mouth at bedtime as needed for sleep. 30 tablet 3   calcitonin, salmon, (MIACALCIN/FORTICAL) 200 UNIT/ACT nasal spray PLACE 1 SPRAY INTO ALTERNATING NOSTRILS DAILY 3.7 mL 0   docusate sodium (COLACE) 100 MG capsule Take 100 mg by mouth 2 (two) times daily.     oxyCODONE (ROXICODONE) 5 MG immediate release tablet Take 0.5-1 tablets (2.5-5 mg total) by mouth every 6 (six) hours as needed for severe pain. 10 tablet 0   No facility-administered medications prior to visit.    Allergies  Allergen Reactions   Monosodium Glutamate Other (See Comments)    other   Ciprofloxacin Other (See Comments)    Other reaction(s): Intolerance   Epinephrine    Erythromycin    Shellfish Allergy Other (See Comments)   Sulfamethoxazole-Trimethoprim    Tetracyclines & Related     Review of Systems REFER TO HPI FOR PERTINENT POSITIVES AND NEGATIVES     Objective:    Physical Exam Vitals and nursing note reviewed.  Constitutional:      Appearance: Normal appearance. She is normal weight. She is not toxic-appearing.  HENT:     Head: Normocephalic and atraumatic.     Right Ear: External ear normal.     Left Ear: External ear normal.     Nose: Nose normal.     Mouth/Throat:     Mouth: Mucous membranes are moist.  Eyes:     Extraocular Movements: Extraocular movements intact.     Conjunctiva/sclera: Conjunctivae normal.     Pupils: Pupils are equal, round, and reactive to light.  Cardiovascular:     Rate and Rhythm: Normal rate and regular rhythm.     Pulses: Normal pulses.     Heart sounds: Normal heart sounds.  Pulmonary:     Effort: Pulmonary effort is normal.     Breath sounds: Normal breath sounds.  Abdominal:     General: Abdomen is flat. Bowel sounds are normal.     Palpations: Abdomen is soft.  Musculoskeletal:        General: Normal range of motion.     Cervical back: Normal range of motion and neck supple.  Skin:    General: Skin is  warm and dry.  Neurological:     General: No focal deficit present.     Mental Status: She is alert and oriented to person, place, and time.  Psychiatric:        Mood and Affect: Mood normal.        Behavior: Behavior normal.        Thought Content: Thought content normal.          Judgment: Judgment normal.    BP 131/77   Pulse 63   Ht 5' 4" (1.626 m)   Wt 131 lb 3.2 oz (59.5 kg)   SpO2 98%   BMI 22.52 kg/m  Wt Readings from Last 3 Encounters:  04/19/21 131 lb 3.2 oz (59.5 kg)  03/08/21 132 lb (59.9 kg)  02/06/21 132 lb (59.9 kg)    Health Maintenance Due  Topic Date Due   Zoster Vaccines- Shingrix (1 of 2) Never done   PNA vac Low Risk Adult (2 of 2 - PPSV23) 04/27/2020   COVID-19 Vaccine (4 - Booster for Moderna series) 09/14/2020   INFLUENZA VACCINE  03/13/2021    There are no preventive care reminders to display for this patient.   Lab Results  Component Value Date   TSH 1.85 07/28/2020   Lab Results  Component Value Date   WBC 4.9 01/21/2021   HGB 13.6 01/21/2021   HCT 40.6 01/21/2021   MCV 95.3 01/21/2021   PLT 272 01/21/2021   Lab Results  Component Value Date   NA 131 (L) 01/21/2021   K 3.9 01/21/2021   CO2 28 01/21/2021   GLUCOSE 102 (H) 01/21/2021   BUN 10 01/21/2021   CREATININE 0.34 (L) 01/21/2021   BILITOT 1.0 01/21/2021   ALKPHOS 52 01/21/2021   AST 44 (H) 01/21/2021   ALT 30 01/21/2021   PROT 7.6 01/21/2021   ALBUMIN 3.4 (L) 01/21/2021   CALCIUM 9.9 01/21/2021   ANIONGAP 8 01/21/2021   EGFR 99 01/18/2021   GFR 189.54 10/27/2019   Lab Results  Component Value Date   CHOL 202 (H) 07/28/2020   Lab Results  Component Value Date   HDL 66 07/28/2020   Lab Results  Component Value Date   LDLCALC 117 (H) 07/28/2020   Lab Results  Component Value Date   TRIG 87 07/28/2020   Lab Results  Component Value Date   CHOLHDL 3.1 07/28/2020   Lab Results  Component Value Date   HGBA1C 5.8 (H) 07/28/2020       Assessment & Plan:    Problem List Items Addressed This Visit   None   1. Other constipation -XRAY recommended outpatient -Continue prunes and Miralax daily -Encouraged to keep pushing fluids  2. Compression fracture of body of thoracic vertebra (HCC) -No longer wearing back brace -She has f/up scheduled with neurosurgeon    Ersel Wadleigh M Jaelle Campanile, PA-C

## 2021-04-20 ENCOUNTER — Telehealth: Payer: Self-pay

## 2021-04-20 NOTE — Telephone Encounter (Signed)
Patient is calling in stating that she would like to go to [facility] on Tuesday of next week to ge there x-ray done instead of [place].

## 2021-04-25 ENCOUNTER — Ambulatory Visit (INDEPENDENT_AMBULATORY_CARE_PROVIDER_SITE_OTHER)
Admission: RE | Admit: 2021-04-25 | Discharge: 2021-04-25 | Disposition: A | Discharge status: Home / Self Care | Payer: Medicare Other | Source: Ambulatory Visit | Attending: Physician Assistant | Admitting: Physician Assistant

## 2021-04-25 ENCOUNTER — Other Ambulatory Visit: Payer: Self-pay

## 2021-04-25 DIAGNOSIS — K5909 Other constipation: Secondary | ICD-10-CM | POA: Diagnosis not present

## 2021-04-27 ENCOUNTER — Telehealth: Payer: Self-pay

## 2021-04-27 NOTE — Telephone Encounter (Signed)
Spoke with patient regarding xray, patient states she was able to use the bathroom but it was a little painful. States she has miralax and colace. Will try a stool softner, she states she gets the urge to poop but cannot fully build up the strength to push it out.

## 2021-05-04 ENCOUNTER — Other Ambulatory Visit: Payer: Self-pay

## 2021-05-04 DIAGNOSIS — K5909 Other constipation: Secondary | ICD-10-CM

## 2021-05-10 ENCOUNTER — Ambulatory Visit (INDEPENDENT_AMBULATORY_CARE_PROVIDER_SITE_OTHER): Payer: Medicare Other

## 2021-05-10 DIAGNOSIS — I495 Sick sinus syndrome: Secondary | ICD-10-CM | POA: Diagnosis not present

## 2021-05-11 LAB — CUP PACEART REMOTE DEVICE CHECK
Battery Remaining Longevity: 162 mo
Battery Remaining Percentage: 100 %
Brady Statistic RA Percent Paced: 0 %
Brady Statistic RV Percent Paced: 76 %
Date Time Interrogation Session: 20220928064100
Implantable Lead Implant Date: 20130305
Implantable Lead Implant Date: 20130305
Implantable Lead Location: 753859
Implantable Lead Location: 753860
Implantable Lead Model: 4469
Implantable Lead Model: 4470
Implantable Lead Serial Number: 546229
Implantable Lead Serial Number: 712016
Implantable Pulse Generator Implant Date: 20200409
Lead Channel Impedance Value: 425 Ohm
Lead Channel Pacing Threshold Amplitude: 0.7 V
Lead Channel Pacing Threshold Pulse Width: 0.4 ms
Lead Channel Setting Pacing Amplitude: 1.1 V
Lead Channel Setting Pacing Pulse Width: 0.4 ms
Lead Channel Setting Sensing Sensitivity: 0.6 mV
Pulse Gen Serial Number: 859007

## 2021-05-12 ENCOUNTER — Other Ambulatory Visit: Payer: Self-pay

## 2021-05-12 ENCOUNTER — Emergency Department (HOSPITAL_COMMUNITY)
Admission: EM | Admit: 2021-05-12 | Discharge: 2021-05-12 | Disposition: A | Discharge status: Home / Self Care | Payer: Medicare Other | Attending: Emergency Medicine | Admitting: Emergency Medicine

## 2021-05-12 ENCOUNTER — Encounter (HOSPITAL_COMMUNITY): Payer: Self-pay

## 2021-05-12 ENCOUNTER — Emergency Department (HOSPITAL_COMMUNITY): Payer: Medicare Other

## 2021-05-12 DIAGNOSIS — W19XXXA Unspecified fall, initial encounter: Secondary | ICD-10-CM

## 2021-05-12 DIAGNOSIS — T1490XA Injury, unspecified, initial encounter: Secondary | ICD-10-CM

## 2021-05-12 DIAGNOSIS — W1840XA Slipping, tripping and stumbling without falling, unspecified, initial encounter: Secondary | ICD-10-CM | POA: Diagnosis not present

## 2021-05-12 DIAGNOSIS — S0181XA Laceration without foreign body of other part of head, initial encounter: Secondary | ICD-10-CM | POA: Diagnosis not present

## 2021-05-12 DIAGNOSIS — S0990XA Unspecified injury of head, initial encounter: Secondary | ICD-10-CM | POA: Diagnosis present

## 2021-05-12 DIAGNOSIS — Z79899 Other long term (current) drug therapy: Secondary | ICD-10-CM | POA: Diagnosis not present

## 2021-05-12 DIAGNOSIS — Z23 Encounter for immunization: Secondary | ICD-10-CM | POA: Insufficient documentation

## 2021-05-12 DIAGNOSIS — Z20822 Contact with and (suspected) exposure to covid-19: Secondary | ICD-10-CM | POA: Diagnosis not present

## 2021-05-12 DIAGNOSIS — S022XXA Fracture of nasal bones, initial encounter for closed fracture: Secondary | ICD-10-CM

## 2021-05-12 LAB — COMPREHENSIVE METABOLIC PANEL
ALT: 43 U/L (ref 0–44)
AST: 65 U/L — ABNORMAL HIGH (ref 15–41)
Albumin: 3.6 g/dL (ref 3.5–5.0)
Alkaline Phosphatase: 54 U/L (ref 38–126)
Anion gap: 9 (ref 5–15)
BUN: 17 mg/dL (ref 8–23)
CO2: 26 mmol/L (ref 22–32)
Calcium: 10 mg/dL (ref 8.9–10.3)
Chloride: 99 mmol/L (ref 98–111)
Creatinine, Ser: 0.34 mg/dL — ABNORMAL LOW (ref 0.44–1.00)
GFR, Estimated: >60 mL/min (ref >60)
Glucose, Bld: 102 mg/dL — ABNORMAL HIGH (ref 70–99)
Potassium: 4.7 mmol/L (ref 3.5–5.1)
Sodium: 134 mmol/L — ABNORMAL LOW (ref 135–145)
Total Bilirubin: 0.5 mg/dL (ref 0.3–1.2)
Total Protein: 7.5 g/dL (ref 6.5–8.1)

## 2021-05-12 LAB — I-STAT CHEM 8, ED
BUN: 20 mg/dL (ref 8–23)
Calcium, Ion: 1.23 mmol/L (ref 1.15–1.40)
Chloride: 100 mmol/L (ref 98–111)
Creatinine, Ser: 0.4 mg/dL — ABNORMAL LOW (ref 0.44–1.00)
Glucose, Bld: 105 mg/dL — ABNORMAL HIGH (ref 70–99)
HCT: 42 % (ref 36.0–46.0)
Hemoglobin: 14.3 g/dL (ref 12.0–15.0)
Potassium: 4.7 mmol/L (ref 3.5–5.1)
Sodium: 136 mmol/L (ref 135–145)
TCO2: 28 mmol/L (ref 22–32)

## 2021-05-12 LAB — URINALYSIS, ROUTINE W REFLEX MICROSCOPIC
Bilirubin Urine: NEGATIVE
Glucose, UA: NEGATIVE mg/dL
Hgb urine dipstick: NEGATIVE
Ketones, ur: NEGATIVE mg/dL
Leukocytes,Ua: NEGATIVE
Nitrite: NEGATIVE
Protein, ur: NEGATIVE mg/dL
Specific Gravity, Urine: 1.012 (ref 1.005–1.030)
pH: 7 (ref 5.0–8.0)

## 2021-05-12 LAB — CBC
HCT: 41.5 % (ref 36.0–46.0)
Hemoglobin: 13.5 g/dL (ref 12.0–15.0)
MCH: 31.7 pg (ref 26.0–34.0)
MCHC: 32.5 g/dL (ref 30.0–36.0)
MCV: 97.4 fL (ref 80.0–100.0)
Platelets: 227 10*3/uL (ref 150–400)
RBC: 4.26 MIL/uL (ref 3.87–5.11)
RDW: 13.3 % (ref 11.5–15.5)
WBC: 6.3 10*3/uL (ref 4.0–10.5)
nRBC: 0 % (ref 0.0–0.2)

## 2021-05-12 LAB — RESP PANEL BY RT-PCR (FLU A&B, COVID) ARPGX2
Influenza A by PCR: NEGATIVE
Influenza B by PCR: NEGATIVE
SARS Coronavirus 2 by RT PCR: NEGATIVE

## 2021-05-12 LAB — ETHANOL: Alcohol, Ethyl (B): <10 mg/dL (ref <10)

## 2021-05-12 LAB — PROTIME-INR
INR: 1.3 — ABNORMAL HIGH (ref 0.8–1.2)
Prothrombin Time: 16.1 seconds — ABNORMAL HIGH (ref 11.4–15.2)

## 2021-05-12 LAB — SAMPLE TO BLOOD BANK

## 2021-05-12 LAB — DIGOXIN LEVEL: Digoxin Level: 0.6 ng/mL — ABNORMAL LOW (ref 0.8–2.0)

## 2021-05-12 LAB — LACTIC ACID, PLASMA: Lactic Acid, Venous: 1.3 mmol/L (ref 0.5–1.9)

## 2021-05-12 MED ORDER — LIDOCAINE-EPINEPHRINE-TETRACAINE (LET) TOPICAL GEL
3.0000 mL | Freq: Once | TOPICAL | Status: AC
  Administered 2021-05-12: 3 mL via TOPICAL
  Filled 2021-05-12: qty 3

## 2021-05-12 MED ORDER — TETANUS-DIPHTH-ACELL PERTUSSIS 5-2.5-18.5 LF-MCG/0.5 IM SUSY
0.5000 mL | PREFILLED_SYRINGE | Freq: Once | INTRAMUSCULAR | Status: AC
  Administered 2021-05-12: 0.5 mL via INTRAMUSCULAR
  Filled 2021-05-12: qty 0.5

## 2021-05-12 NOTE — ED Notes (Addendum)
Laceration right eye brow, right side of nose. Slight bleeding.

## 2021-05-12 NOTE — Progress Notes (Signed)
Orthopedic Tech Progress Note Patient Details:  Deanna Rivera 08-19-1932 [id number] Level 2 trauma. Not needed Patient ID: Deanna Rivera, female   DOB: 1932-12-23, 87 y.o.   MRN: [id number]  Deanna Rivera 05/12/2021, 2:10 PM

## 2021-05-12 NOTE — Discharge Instructions (Addendum)
Hold your Pradaxa until tomorrow.  Follow-up with the ear nose and throat doctor regarding your nasal bone fracture.  You need to have your sutures removed in 1 week.  This can be done by your primary care doctor.  Return to emergency room if you have any worsening symptoms.

## 2021-05-12 NOTE — ED Provider Notes (Signed)
Patient care was taken over from Dr. Sherry Ruffing.  Patient presented after mechanical fall.  She struck her face and forehead.  There is no loss of consciousness.  She is on Pradaxa but she said she missed her last 2 days worth of Pradaxa.  She did take it this morning but prior to that had not taken it in the last 2 days.  She does have evidence of a nasal bone fracture.  Her CT scan shows no evidence of intracranial hemorrhage.  Her labs are nonconcerning.  Her urine does not appear to be consistent with infection.  Her COVID/flu test was negative.  She is otherwise well-appearing.  She has a small laceration that was sutured.  She was discharged home in good condition.  She was given wound care instructions and advised to follow-up with her PCP in 1 week for suture removal.  She was given head injury precautions and return precautions.  She also was given a referral to follow-up with ENT regarding her nasal bone fracture.   Malvin Johns, MD 05/12/21 1840

## 2021-05-12 NOTE — ED Provider Notes (Signed)
[facility] EMERGENCY DEPARTMENT Provider Note   CSN: [id number] Arrival date & time: 05/12/21  1357     History No chief complaint on file.   Deanna Rivera is a 87 y.o. female.  The history is provided by the patient, the spouse, medical records and the EMS personnel. No language interpreter was used.  Fall This is a new problem. The current episode started less than 1 hour ago. The problem occurs rarely. The problem has not changed since onset.Associated symptoms include headaches. Pertinent negatives include no chest pain, no abdominal pain and no shortness of breath. Nothing aggravates the symptoms. Nothing relieves the symptoms. She has tried nothing for the symptoms. The treatment provided no relief.      No past medical history on file.  There are no problems to display for this patient.    The histories are not reviewed yet. Please review them in the "History" navigator section and refresh this [place].   OB History   No obstetric history on file.     No family history on file.     Home Medications Prior to Admission medications   Not on File    Allergies    Patient has no allergy information on record.  Review of Systems   Review of Systems  Constitutional:  Positive for fatigue. Negative for chills and diaphoresis.  HENT:  Negative for congestion.   Respiratory:  Negative for cough, chest tightness, shortness of breath and wheezing.   Cardiovascular:  Negative for chest pain and palpitations.  Gastrointestinal:  Negative for abdominal pain, diarrhea, nausea and vomiting.  Genitourinary:  Positive for frequency. Negative for dysuria.  Musculoskeletal:  Negative for back pain and neck pain.  Skin:  Positive for wound.  Neurological:  Positive for light-headedness and headaches. Negative for dizziness.  Psychiatric/Behavioral:  Negative for agitation.   All other systems reviewed and are negative.  Physical Exam Updated Vital  Signs BP (!) 150/90   Pulse 60   Temp 98.3 F (36.8 C) (Oral)   Resp 16   Physical Exam Vitals and nursing note reviewed.  Constitutional:      General: She is not in acute distress.    Appearance: She is well-developed. She is not ill-appearing, toxic-appearing or diaphoretic.  HENT:     Head: Abrasion and laceration present.      Nose: Signs of injury and nasal tenderness present. No septal deviation, laceration (abrasion), congestion or rhinorrhea.     Right Nostril: No septal hematoma or occlusion.     Left Nostril: No septal hematoma or occlusion.     Mouth/Throat:     Mouth: Mucous membranes are dry.     Pharynx: No oropharyngeal exudate.  Eyes:     Extraocular Movements: Extraocular movements intact.     Conjunctiva/sclera: Conjunctivae normal.     Pupils: Pupils are equal, round, and reactive to light.  Cardiovascular:     Rate and Rhythm: Normal rate and regular rhythm.     Heart sounds: No murmur heard. Pulmonary:     Effort: Pulmonary effort is normal. No respiratory distress.     Breath sounds: Normal breath sounds.  Abdominal:     Palpations: Abdomen is soft.     Tenderness: There is no abdominal tenderness.  Musculoskeletal:        General: No tenderness or signs of injury.     Cervical back: Neck supple. No tenderness.  Skin:    General: Skin is warm and dry.  Findings: No erythema.  Neurological:     General: No focal deficit present.     Mental Status: She is alert.  Psychiatric:        Mood and Affect: Mood normal.    ED Results / Procedures / Treatments   Labs (all labs ordered are listed, but only abnormal results are displayed) Labs Reviewed  COMPREHENSIVE METABOLIC PANEL - Abnormal; Notable for the following components:      Result Value   Sodium 134 (*)    Glucose, Bld 102 (*)    Creatinine, Ser 0.34 (*)    AST 65 (*)    All other components within normal limits  DIGOXIN LEVEL - Abnormal; Notable for the following components:    Digoxin Level 0.6 (*)    All other components within normal limits  I-STAT CHEM 8, ED - Abnormal; Notable for the following components:   Creatinine, Ser 0.40 (*)    Glucose, Bld 105 (*)    All other components within normal limits  RESP PANEL BY RT-PCR (FLU A&B, COVID) ARPGX2  URINE CULTURE  CBC  ETHANOL  LACTIC ACID, PLASMA  URINALYSIS, ROUTINE W REFLEX MICROSCOPIC  PROTIME-INR  SAMPLE TO BLOOD BANK    EKG None  Radiology CT HEAD WO CONTRAST  Result Date: 05/12/2021 CLINICAL DATA:  Facial trauma, fall EXAM: CT HEAD WITHOUT CONTRAST CT MAXILLOFACIAL WITHOUT CONTRAST CT CERVICAL SPINE WITHOUT CONTRAST TECHNIQUE: Multidetector CT imaging of the head, cervical spine, and maxillofacial structures were performed using the standard protocol without intravenous contrast. Multiplanar CT image reconstructions of the cervical spine and maxillofacial structures were also generated. COMPARISON:  None. FINDINGS: CT HEAD FINDINGS Brain: No evidence of acute infarction, hemorrhage, hydrocephalus, extra-axial collection or mass lesion/mass effect. Periventricular and deep white matter hypodensity. Vascular: No hyperdense vessel or unexpected calcification. CT FACIAL BONES FINDINGS Skull: Normal. Negative for fracture or focal lesion. Facial bones: Minimally displaced fractures of the nasal bones. No other displaced fractures or dislocations. Sinuses/Orbits: No acute finding. Other: Hematoma and laceration of the right forehead (series 23, image 14). Soft tissue edema of the nose. CT CERVICAL SPINE FINDINGS Alignment: Normal. Skull base and vertebrae: No acute fracture. No primary bone lesion or focal pathologic process. Soft tissues and spinal canal: No prevertebral fluid or swelling. No visible canal hematoma. Disc levels: Moderate multilevel disc degenerative disease of the lower cervical spine. Upper chest: Negative. Other: None. IMPRESSION: 1. No acute intracranial pathology. Small-vessel white matter  disease. 2. Minimally displaced fractures of the nasal bones. No other displaced fractures or dislocations of the facial bones. 3. Hematoma and laceration of the right forehead. Soft tissue edema of the nose. 4. No fracture or static subluxation of the cervical spine. 5. Moderate multilevel disc degenerative disease of the lower cervical spine. Electronically Signed   By: Delanna Ahmadi M.D.   On: 05/12/2021 15:15   CT CERVICAL SPINE WO CONTRAST  Result Date: 05/12/2021 CLINICAL DATA:  Facial trauma, fall EXAM: CT HEAD WITHOUT CONTRAST CT MAXILLOFACIAL WITHOUT CONTRAST CT CERVICAL SPINE WITHOUT CONTRAST TECHNIQUE: Multidetector CT imaging of the head, cervical spine, and maxillofacial structures were performed using the standard protocol without intravenous contrast. Multiplanar CT image reconstructions of the cervical spine and maxillofacial structures were also generated. COMPARISON:  None. FINDINGS: CT HEAD FINDINGS Brain: No evidence of acute infarction, hemorrhage, hydrocephalus, extra-axial collection or mass lesion/mass effect. Periventricular and deep white matter hypodensity. Vascular: No hyperdense vessel or unexpected calcification. CT FACIAL BONES FINDINGS Skull: Normal. Negative for fracture or  focal lesion. Facial bones: Minimally displaced fractures of the nasal bones. No other displaced fractures or dislocations. Sinuses/Orbits: No acute finding. Other: Hematoma and laceration of the right forehead (series 23, image 14). Soft tissue edema of the nose. CT CERVICAL SPINE FINDINGS Alignment: Normal. Skull base and vertebrae: No acute fracture. No primary bone lesion or focal pathologic process. Soft tissues and spinal canal: No prevertebral fluid or swelling. No visible canal hematoma. Disc levels: Moderate multilevel disc degenerative disease of the lower cervical spine. Upper chest: Negative. Other: None. IMPRESSION: 1. No acute intracranial pathology. Small-vessel white matter disease. 2.  Minimally displaced fractures of the nasal bones. No other displaced fractures or dislocations of the facial bones. 3. Hematoma and laceration of the right forehead. Soft tissue edema of the nose. 4. No fracture or static subluxation of the cervical spine. 5. Moderate multilevel disc degenerative disease of the lower cervical spine. Electronically Signed   By: Delanna Ahmadi M.D.   On: 05/12/2021 15:15   CT MAXILLOFACIAL WO CONTRAST  Result Date: 05/12/2021 CLINICAL DATA:  Facial trauma, fall EXAM: CT HEAD WITHOUT CONTRAST CT MAXILLOFACIAL WITHOUT CONTRAST CT CERVICAL SPINE WITHOUT CONTRAST TECHNIQUE: Multidetector CT imaging of the head, cervical spine, and maxillofacial structures were performed using the standard protocol without intravenous contrast. Multiplanar CT image reconstructions of the cervical spine and maxillofacial structures were also generated. COMPARISON:  None. FINDINGS: CT HEAD FINDINGS Brain: No evidence of acute infarction, hemorrhage, hydrocephalus, extra-axial collection or mass lesion/mass effect. Periventricular and deep white matter hypodensity. Vascular: No hyperdense vessel or unexpected calcification. CT FACIAL BONES FINDINGS Skull: Normal. Negative for fracture or focal lesion. Facial bones: Minimally displaced fractures of the nasal bones. No other displaced fractures or dislocations. Sinuses/Orbits: No acute finding. Other: Hematoma and laceration of the right forehead (series 23, image 14). Soft tissue edema of the nose. CT CERVICAL SPINE FINDINGS Alignment: Normal. Skull base and vertebrae: No acute fracture. No primary bone lesion or focal pathologic process. Soft tissues and spinal canal: No prevertebral fluid or swelling. No visible canal hematoma. Disc levels: Moderate multilevel disc degenerative disease of the lower cervical spine. Upper chest: Negative. Other: None. IMPRESSION: 1. No acute intracranial pathology. Small-vessel white matter disease. 2. Minimally displaced  fractures of the nasal bones. No other displaced fractures or dislocations of the facial bones. 3. Hematoma and laceration of the right forehead. Soft tissue edema of the nose. 4. No fracture or static subluxation of the cervical spine. 5. Moderate multilevel disc degenerative disease of the lower cervical spine. Electronically Signed   By: Delanna Ahmadi M.D.   On: 05/12/2021 15:15    Procedures Procedures   Medications Ordered in ED Medications  lidocaine-EPINEPHrine-tetracaine (LET) topical gel (has no administration in time range)  Tdap (BOOSTRIX) injection 0.5 mL (0.5 mLs Intramuscular Given 05/12/21 1542)    ED Course  I have reviewed the triage vital signs and the nursing notes.  Pertinent labs & imaging results that were available during my care of the patient were reviewed by me and considered in my medical decision making (see chart for details).    MDM Rules/Calculators/A&P                           Seamans Ronny is a 87 y.o. female with a past medical history significant for Heart failure with pacemaker, hypothyroidism, permanent atrial fibrillation with Pradaxa use, Recurrent urinary tract infections, previous thoracic compression fracture, and polymyalgia rheumatica who presents as a  level 2 trauma for fall on blood thinners.  According to patient, she forgot her blood thinner last night and took it in the middle the night.  She says that this afternoon she was wearing some new expensive shoes and got tripped up on a rug causing her to fall forward hitting her face on the ground.  She did not lose consciousness but is complaining of pain and swelling and some bleeding from her right forehead and nose.  She is denying any pain in her back, chest, abdomen, or pelvis.  Denies any pain in her extremities.  Only has some moderate pain in her face.  Denies any vision changes, nausea, or vomiting.  Of note, she does report she had some urinary frequency recently and has felt more tired  over the last few days.  Denies any other complaints on arrival.  Vital signs on arrival showed hypertension and patient reports she is frequently hypertensive.  She reports that "she does not get concerned unless it is over 200".  On exam, patient does have swelling with hematoma to her right eyebrow and several abrasions/small punctate lacerations.  They are currently appear hemostatic.  No evidence of nasal septal hematoma.  Tenderness of the right face and around the orbit.  Pupils are symmetric and reactive with normal extraocular movements.  Neck is nontender.  Chest and back are nontender.  Due to the amount of force that the patient hit with, will get CT of the face, head, and neck.  We will get labs and urinalysis given the frequency and the fatigue reported.  Chart review does show that she is on digoxin so we will get a digoxin level.  We will also update her tetanus as it appears she is out of date on tetanus.  Anticipate reassessment after work-up to determine disposition.  Patient CT imaging of the face, head, neck only revealed nasal fractures.  Patient's wounds were cleaned and the nose appears to be an abrasion but she does have a small laceration to her right eyebrow.  As it is still oozing, will place let gel and have a stitch placed to achieve hemostasis and for cosmesis.  Her tetanus was updated.  Given her report of fatigue for the last week with frequency, we will still wait for urinalysis and labs.  Care transferred to oncoming team awaiting for results of labs and urinalysis.  Anticipate discharge home when work-up is completed.    Final Clinical Impression(s) / ED Diagnoses Final diagnoses:  Trauma    Clinical Impression: 1. Trauma     Disposition: Care transferred to oncoming team awaiting for results of labs and urinalysis.  Anticipate discharge home when work-up is completed.  This note was prepared with assistance of Systems analyst.  Occasional wrong-word or sound-a-like substitutions may have occurred due to the inherent limitations of voice recognition software.     Deanna Rivera, Gwenyth Allegra, MD 05/12/21 [phone]

## 2021-05-12 NOTE — ED Provider Notes (Signed)
..  Laceration Repair  Date/Time: 05/12/2021 6:25 PM Performed by: Sherrell Puller, PA-C Authorized by: Sherrell Puller, PA-C   Consent:    Consent obtained:  Verbal   Consent given by:  Deanna Rivera   Risks discussed:  Infection, need for additional repair, pain, poor cosmetic result and poor wound healing   Alternatives discussed:  No treatment and delayed treatment Universal protocol:    Procedure explained and questions answered to Deanna Rivera or proxy's satisfaction: yes     Relevant documents present and verified: no     Test results available: no     Imaging studies available: no     Required blood products, implants, devices, and special equipment available: no     Site/side marked: no     Immediately prior to procedure, a time out was called: no     Deanna Rivera identity confirmed:  Verbally with Deanna Rivera Anesthesia:    Anesthesia method:  Topical application   Topical anesthetic:  LET Laceration details:    Location:  Face   Face location:  R eyebrow   Length (cm):  1   Depth (mm):  1 Pre-procedure details:    Preparation:  Deanna Rivera was prepped and draped in usual sterile fashion Exploration:    Hemostasis achieved with:  Direct pressure and LET   Imaging outcome: foreign body not noted   Treatment:    Area cleansed with:  Saline   Amount of cleaning:  Extensive   Irrigation solution:  Sterile saline   Irrigation method:  Tap and pressure wash   Visualized foreign bodies/material removed: no     Debridement:  None   Undermining:  None Skin repair:    Repair method:  Sutures   Suture size:  6-0   Suture material:  Prolene   Suture technique:  Simple interrupted   Number of sutures:  1 Approximation:    Approximation:  Close Repair type:    Repair type:  Simple Post-procedure details:    Dressing:  Open (no dressing)   Procedure completion:  Tolerated well, no immediate complications    Sherrell Puller, PA-C 05/12/21 1828    Tegeler, Gwenyth Allegra, MD 05/13/21 [phone]

## 2021-05-12 NOTE — ED Notes (Signed)
Dr. Sherry Ruffing aware of pt's intolerance to epinephrine. Per provider, the epinephrine ordered is acceptable. Pt made aware of this decision.

## 2021-05-12 NOTE — ED Triage Notes (Signed)
Mechanical fall from standing then trip on shoes. Pt on pradaxa. Alert and oriented x 4. Facial bleed controlled.

## 2021-05-13 LAB — URINE CULTURE: Culture: NO GROWTH

## 2021-05-15 ENCOUNTER — Encounter: Payer: Self-pay | Admitting: Physician Assistant

## 2021-05-15 ENCOUNTER — Encounter: Payer: Self-pay | Admitting: Internal Medicine

## 2021-05-17 ENCOUNTER — Encounter: Payer: Self-pay | Admitting: Family Medicine

## 2021-05-17 ENCOUNTER — Ambulatory Visit (INDEPENDENT_AMBULATORY_CARE_PROVIDER_SITE_OTHER): Payer: Medicare Other | Admitting: Family Medicine

## 2021-05-17 ENCOUNTER — Other Ambulatory Visit: Payer: Self-pay

## 2021-05-17 VITALS — BP 122/80 | HR 60 | Temp 98.1°F | Ht 64.0 in | Wt 132.0 lb

## 2021-05-17 DIAGNOSIS — R2689 Other abnormalities of gait and mobility: Secondary | ICD-10-CM | POA: Diagnosis not present

## 2021-05-17 DIAGNOSIS — S22000A Wedge compression fracture of unspecified thoracic vertebra, initial encounter for closed fracture: Secondary | ICD-10-CM

## 2021-05-17 DIAGNOSIS — R2681 Unsteadiness on feet: Secondary | ICD-10-CM | POA: Diagnosis not present

## 2021-05-17 DIAGNOSIS — G7241 Inclusion body myositis [IBM]: Secondary | ICD-10-CM

## 2021-05-17 DIAGNOSIS — M353 Polymyalgia rheumatica: Secondary | ICD-10-CM

## 2021-05-17 DIAGNOSIS — R296 Repeated falls: Secondary | ICD-10-CM | POA: Diagnosis not present

## 2021-05-17 DIAGNOSIS — Z7901 Long term (current) use of anticoagulants: Secondary | ICD-10-CM

## 2021-05-17 DIAGNOSIS — Z4802 Encounter for removal of sutures: Secondary | ICD-10-CM

## 2021-05-17 NOTE — Patient Instructions (Signed)
Please return in 3 months for your annual complete physical; please come fasting.   I have ordered a new walker and wheelchair for you to use. We will contact you or deliver it.   If you have any questions or concerns, please don't hesitate to send me a message via MyChart or call the office at [phone]. Thank you for visiting with [place] today! It's our pleasure caring for you.

## 2021-05-17 NOTE — Progress Notes (Signed)
Remote pacemaker transmission.   

## 2021-05-17 NOTE — Progress Notes (Signed)
Subjective  CC:  Chief Complaint  Patient presents with   Fall    05/12/2021, ED follow up    HPI: Deanna Rivera is a 87 y.o. female who presents to the office today to address the problems listed above in the chief complaint. Reviewed recent ER visit; mechanical fall; nasal fracture, facial contusions and small laceration above right eye. Frequent falls: poor balance and leg weakness. Uses rollater.   Compression fracture for repeat xray next month; doing better overall. No longer wearing brace. Pain is less.  Anticoagulated: no bleeding.   Assessment  1. Frequent falls   2. Unsteady gait   3. Impairment of balance   4. Encounter for removal of sutures   5. Compression fracture of body of thoracic vertebra (HCC)   6. Polymyalgia rheumatica ([place])   7. Inclusion body myositis   8. Current use of long term anticoagulation      Plan  Frequent falls due to gait abnormality:  mutlifactorial: drop foot; left side weakness, poor balance. In PT> stop rollator. Ordered walker and wheelchair. Extensive counseling given.  Removed single suture. Wound is healing and intact. Wound care discussed. Compression fractures: improving. Need to prevent future falls.    Follow up: 3 mo for cpe  Visit date not found  No orders of the defined types were placed in this encounter.  No orders of the defined types were placed in this encounter.     I reviewed the patients updated PMH, FH, and SocHx.    Patient Active Problem List   Diagnosis Date Noted   Age-related osteoporosis with current pathological fracture 02/06/2021    Priority: 1.   SSS (sick sinus syndrome) ([place]) 02/11/2019    Priority: 1.   Status post placement of cardiac pacemaker 07/08/2018    Priority: 1.   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   Nontoxic multinodular goiter 09/08/2015    Priority: 1.   Current use of long term anticoagulation 05/16/2015    Priority: 1.   (HFpEF) heart failure with  preserved ejection fraction ([place]) 09/13/2014    Priority: 1.   Pacemaker 11/09/2011    Priority: 1.   Impaired fasting glucose 05/23/2009    Priority: 1.   Long term current use of systemic steroids 03/30/2008    Priority: 1.   Polymyalgia rheumatica ([place]) 03/30/2008    Priority: 1.   Benign essential hypertension 05/05/2007    Priority: 1.   Persistent atrial fibrillation ([place]) 11/06/2006    Priority: 1.   Pure hypercholesterolemia 11/06/2006    Priority: 1.   Hypothyroidism due to Hashimoto's thyroiditis, Rx Levothyroxine 06/25/2005    Priority: 1.   Primary osteoarthritis of right knee 10/23/2019    Priority: 2.   Nonrheumatic mitral valve regurgitation 09/16/2015    Priority: 2.   Recurrent UTI 03/14/2015    Priority: 2.   Neuralgia, postherpetic 07/02/2019    Priority: 3.   OAB (overactive bladder) 07/02/2019    Priority: 3.   Constipation 01/18/2019    Priority: 3.   Frequent falls 05/17/2021   Compression fracture of body of thoracic vertebra (HCC) 02/06/2021   Shellfish allergy 11/22/2015   Synovial cyst of popliteal space 11/22/2008   Current Meds  Medication Sig   acetaminophen (TYLENOL) 500 MG tablet Take 500 mg by mouth every 6 (six) hours as needed for mild pain or headache.   dabigatran (PRADAXA) 150 MG CAPS capsule TAKE 1 CAPSULE BY MOUTH  EVERY 12 HOURS  digoxin (LANOXIN) 0.125 MG tablet Take 1 tablet (0.125 mg total) by mouth daily.   digoxin (LANOXIN) 0.125 MG tablet Take 125 mcg by mouth daily.   dorzolamide-timolol (COSOPT) 22.3-6.8 MG/ML ophthalmic solution Place 1 drop into both eyes 2 (two) times daily.   hydrochlorothiazide (HYDRODIURIL) 25 MG tablet TAKE 1 TABLET BY MOUTH  DAILY (Patient taking differently: Take 12.5 mg by mouth daily.)   hydrochlorothiazide (HYDRODIURIL) 25 MG tablet Take 12.5 mg by mouth daily.   latanoprost (XALATAN) 0.005 % ophthalmic solution Place 1 drop into both eyes at bedtime.   latanoprost (XALATAN) 0.005 % ophthalmic  solution Place 1 drop into both eyes at bedtime.   levothyroxine (SYNTHROID) 112 MCG tablet TAKE 1 TABLET BY MOUTH  DAILY (Patient taking differently: Take 112 mcg by mouth daily before breakfast.)   levothyroxine (SYNTHROID) 112 MCG tablet Take 112 mcg by mouth daily before breakfast.   LUMIGAN 0.01 % SOLN Place 1 drop into both eyes at bedtime.   metoprolol succinate (TOPROL-XL) 50 MG 24 hr tablet Take 50 mg by mouth in the morning.   Metoprolol Succinate 50 MG CS24 Take 50 mg by mouth daily.   ondansetron (ZOFRAN ODT) 4 MG disintegrating tablet Take 1 tablet (4 mg total) by mouth every 8 (eight) hours as needed for nausea or vomiting.   PRADAXA 150 MG CAPS capsule Take 150 mg by mouth in the morning and at bedtime.   predniSONE (DELTASONE) 1 MG tablet TAKE 1 TABLET BY MOUTH  DAILY (Patient taking differently: Take 1 mg by mouth daily with breakfast.)   predniSONE (DELTASONE) 1 MG tablet Take 1 mg by mouth daily with breakfast.   traZODone (DESYREL) 50 MG tablet Take 0.5-1 tablets (25-50 mg total) by mouth at bedtime as needed for sleep.    Allergies: Patient is allergic to shellfish-derived products, epinephrine, monosodium glutamate, ciprofloxacin, ciprofloxacin, epinephrine, erythromycin, erythromycin, monosodium glutamate, shellfish allergy, sulfa antibiotics, sulfamethoxazole-trimethoprim, tetracyclines & related, and tetracyclines & related. Family History: Patient family history includes Cirrhosis (age of onset: 73) in her father; Early death (age of onset: 29) in her mother; Hypertension in her brother; Lung cancer (age of onset: 53) in her brother; Scleroderma in her daughter. Social History:  Patient  reports that she has never smoked. She has never used smokeless tobacco. She reports that she does not currently use alcohol. She reports that she does not currently use drugs.  Review of Systems: Constitutional: Negative for fever malaise or anorexia Cardiovascular: negative for chest  pain Respiratory: negative for SOB or persistent cough Gastrointestinal: negative for abdominal pain  Objective  Vitals: BP 122/80   Pulse 60   Temp 98.1 F (36.7 C) (Temporal)   Ht 5\' 4"  (1.626 m)   Wt 132 lb (59.9 kg)   SpO2 97%   BMI 22.66 kg/m  General: no acute distress , A&Ox3 HEENT: PEERL, conjunctiva normal, neck is supple, bilateral periorbital ecchymosis. Scabbd wound above right eye. Removed single suture.  Cardiovascular:  RRR without murmur or gallop.  Respiratory:  Good breath sounds bilaterally, CTAB with normal respiratory effort Skin:  Warm, no rashes    Commons side effects, risks, benefits, and alternatives for medications and treatment plan prescribed today were discussed, and the patient expressed understanding of the given instructions. Patient is instructed to call or message via MyChart if he/she has any questions or concerns regarding our treatment plan. No barriers to understanding were identified. We discussed Red Flag symptoms and signs in detail. Patient expressed understanding regarding what  to do in case of urgent or emergency type symptoms.  Medication list was reconciled, printed and provided to the patient in AVS. Patient instructions and summary information was reviewed with the patient as documented in the AVS. This note was prepared with assistance of Dragon voice recognition software. Occasional wrong-word or sound-a-like substitutions may have occurred due to the inherent limitations of voice recognition software  This visit occurred during the SARS-CoV-2 public health emergency.  Safety protocols were in place, including screening questions prior to the visit, additional usage of staff PPE, and extensive cleaning of exam room while observing appropriate contact time as indicated for disinfecting solutions.

## 2021-05-19 ENCOUNTER — Telehealth: Payer: Self-pay

## 2021-05-19 NOTE — Telephone Encounter (Signed)
Spoke with patient, let her know where the order was sent to. Resent order to [facility], was given a alternate fax

## 2021-05-19 NOTE — Telephone Encounter (Signed)
Patient called in stating that during her visit on 05/17/21 an order was to be placed for walker and a transport chair.    Patient states she did not have a specific place as to where the order was to be sent.  Patient would like a call back with a confirmation that the order was placed and who this was placed with.

## 2021-06-05 ENCOUNTER — Ambulatory Visit: Payer: Medicare Other | Admitting: Internal Medicine

## 2021-06-09 ENCOUNTER — Other Ambulatory Visit: Payer: Self-pay | Admitting: Internal Medicine

## 2021-06-15 ENCOUNTER — Encounter: Payer: Self-pay | Admitting: Internal Medicine

## 2021-06-15 ENCOUNTER — Ambulatory Visit (INDEPENDENT_AMBULATORY_CARE_PROVIDER_SITE_OTHER): Payer: Medicare Other | Admitting: Internal Medicine

## 2021-06-15 ENCOUNTER — Other Ambulatory Visit: Payer: Self-pay

## 2021-06-15 VITALS — BP 114/76 | HR 60 | Ht 64.0 in | Wt 132.0 lb

## 2021-06-15 DIAGNOSIS — M8000XS Age-related osteoporosis with current pathological fracture, unspecified site, sequela: Secondary | ICD-10-CM

## 2021-06-15 DIAGNOSIS — E063 Autoimmune thyroiditis: Secondary | ICD-10-CM | POA: Diagnosis not present

## 2021-06-15 DIAGNOSIS — Z8639 Personal history of other endocrine, nutritional and metabolic disease: Secondary | ICD-10-CM | POA: Insufficient documentation

## 2021-06-15 DIAGNOSIS — M810 Age-related osteoporosis without current pathological fracture: Secondary | ICD-10-CM

## 2021-06-15 LAB — VITAMIN D 25 HYDROXY (VIT D DEFICIENCY, FRACTURES): VITD: 25.86 ng/mL — ABNORMAL LOW (ref 30.00–100.00)

## 2021-06-15 LAB — TSH: TSH: 1.21 u[IU]/mL (ref 0.35–5.50)

## 2021-06-15 NOTE — Patient Instructions (Addendum)
-   Please continue to consume 1200 mg calcium in your diet   - Check out the following medications    Forteo  Prolia  Fosamax   Let us know if you have interest in taking any of these medications

## 2021-06-15 NOTE — Progress Notes (Signed)
Name: Deanna Rivera  MRN/ DOB: [id number], [birth year]    Age/ Sex: 87 y.o., female    PCP: [name], MD   Reason for Endocrinology Evaluation: Osteoporosis     Date of Initial Endocrinology Evaluation: 06/15/2021     HPI: Ms. Deanna Rivera is a 87 y.o. female with a past medical history of CHF, A.Fib , Hypothyroidism and PMR. The patient presented for initial endocrinology clinic visit on 06/15/2021 for consultative assistance with her Osteoporosis.   Pt was diagnosed with osteoporosis:  Pt with a hx of T8 compression fracture in 01/2021. Pt has had up to 50-60 times over the past 10 yrs.   Menarche at age : does not recall Menopausal at age : S/P hysterectomy in her 70's  Fracture Hx: compression Fracture  Hx of HRT: she was on HRT for years , stopped approximately 5 yrs ago FH of osteoporosis or hip fracture: no Prior Hx of anti-estrogenic therapy :no  Prior Hx of anti-resorptive therapy : ? Calcitonin  Drinks 3 glasses of milk a day and yogurt  She is on chronic prednisone a day, currently 1 mg , she has been on Prednisone for years.   She is not on Vitamin D or calcium  No prior dx of cancer except skin cancer, no prior exposure to cancer   Denies heart burn   Pt declines DXA scan - had vertigo with the last one   Has Hashimoto's Thyroiditis, was diagnosed with this years ago. On Lt-4 replacement     HISTORY:  Past Medical History:  Past Medical History:  Diagnosis Date   Cataract    Hypertension    Moderate aortic stenosis    Moderate mitral regurgitation    Persistent atrial fibrillation (HCC)    PMR (polymyalgia rheumatica) (HCC)    Second degree AV block    Sick sinus syndrome (HCC)    Thyroid disease    Past Surgical History:  Past Surgical History:  Procedure Laterality Date   ABDOMINAL HYSTERECTOMY     CESAREAN SECTION     CHOLECYSTECTOMY     PACEMAKER GENERATOR CHANGE  11/20/2018   [employer] Accolade MRI PPM implanted in  [place] CYST EXCISION      Social History:  reports that she has never smoked. She has never used smokeless tobacco. She reports that she does not currently use alcohol. She reports that she does not currently use drugs. Family History: family history includes Cirrhosis (age of onset: 6) in her father; Early death (age of onset: 48) in her mother; Hypertension in her brother; Lung cancer (age of onset: 37) in her brother; Scleroderma in her daughter.   HOME MEDICATIONS: Allergies as of 06/15/2021       Reactions   Shellfish-derived Products Shortness Of Breath, Other (See Comments)   Lips go numb   Epinephrine Other (See Comments), Hypertension   "Makes me feel wired"   Monosodium Glutamate Other (See Comments)   other   Ciprofloxacin Other (See Comments)   Other reaction(s): Intolerance   Ciprofloxacin Other (See Comments)   Allergic reaction not recalled   Epinephrine    Erythromycin    Erythromycin Other (See Comments)   Allergic reaction not recalled   Monosodium Glutamate Other (See Comments), Hypertension   "Makes me feel badly"   Shellfish Allergy Other (See Comments)   Sulfa Antibiotics Other (See Comments)   Allergic reaction not recalled   Sulfamethoxazole-trimethoprim  Tetracyclines & Related    Tetracyclines & Related Other (See Comments)   Allergic reaction not recalled        Medication List        Accurate as of June 15, 2021 11:31 AM. If you have any questions, ask your nurse or doctor.          STOP taking these medications    ondansetron 4 MG disintegrating tablet Commonly known as: Zofran ODT Stopped by: Dorita Sciara, MD       TAKE these medications    acetaminophen 500 MG tablet Commonly known as: TYLENOL Take 500 mg by mouth every 6 (six) hours as needed for mild pain or headache.   dabigatran 150 MG Caps capsule Commonly known as: Pradaxa TAKE 1 CAPSULE BY MOUTH  EVERY 12 HOURS    Pradaxa 150 MG Caps capsule Generic drug: dabigatran Take 150 mg by mouth in the morning and at bedtime.   digoxin 0.125 MG tablet Commonly known as: LANOXIN Take 125 mcg by mouth daily.   digoxin 0.125 MG tablet Commonly known as: LANOXIN TAKE 1 TABLET BY MOUTH  DAILY   dorzolamide-timolol 22.3-6.8 MG/ML ophthalmic solution Commonly known as: COSOPT Place 1 drop into both eyes 2 (two) times daily.   hydrochlorothiazide 25 MG tablet Commonly known as: HYDRODIURIL TAKE 1 TABLET BY MOUTH  DAILY What changed:  how much to take Another medication with the same name was removed. Continue taking this medication, and follow the directions you see here.   latanoprost 0.005 % ophthalmic solution Commonly known as: XALATAN Place 1 drop into both eyes at bedtime. What changed: Another medication with the same name was removed. Continue taking this medication, and follow the directions you see here. Changed by: Dorita Sciara, MD   levothyroxine 112 MCG tablet Commonly known as: SYNTHROID TAKE 1 TABLET BY MOUTH  DAILY What changed:  when to take this Another medication with the same name was removed. Continue taking this medication, and follow the directions you see here.   Lumigan 0.01 % Soln Generic drug: bimatoprost Place 1 drop into both eyes at bedtime.   metoprolol succinate 50 MG 24 hr tablet Commonly known as: TOPROL-XL Take 50 mg by mouth in the morning. What changed: Another medication with the same name was removed. Continue taking this medication, and follow the directions you see here. Changed by: Dorita Sciara, MD   predniSONE 1 MG tablet Commonly known as: DELTASONE TAKE 1 TABLET BY MOUTH  DAILY What changed: when to take this   predniSONE 1 MG tablet Commonly known as: DELTASONE Take 1 mg by mouth daily with breakfast. What changed: Another medication with the same name was changed. Make sure you understand how and when to take each.    traZODone 50 MG tablet Commonly known as: DESYREL Take 0.5-1 tablets (25-50 mg total) by mouth at bedtime as needed for sleep.          REVIEW OF SYSTEMS: A comprehensive ROS was conducted with the patient and is negative except as per HPI   OBJECTIVE:  VS: BP 114/76 (BP Location: Left Arm, Patient Position: Sitting, Cuff Size: Small)   Pulse 60   Ht 5\' 4"  (1.626 m)   Wt 132 lb (59.9 kg)   SpO2 99%   BMI 22.66 kg/m    Wt Readings from Last 3 Encounters:  06/15/21 132 lb (59.9 kg)  05/17/21 132 lb (59.9 kg)  05/12/21 132 lb (59.9 kg)  EXAM: General: Pt appears well and is in NAD  Neck: General: Supple without adenopathy. Thyroid: Thyroid size normal.  No goiter or nodules appreciated.   Lungs: Clear with good BS bilat with no rales, rhonchi, or wheezes  Heart: Auscultation: RRR.  Abdomen: Normoactive bowel sounds, soft, nontender, without masses or organomegaly palpable  Extremities:  BL LE: No pretibial edema , mild edema around the ankles   Mental Status: Judgment, insight: Intact Orientation: Oriented to time, place, and person Mood and affect: No depression, anxiety, or agitation     DATA REVIEWED: Results for Deanna Rivera, Deanna Rivera (MRN [id number]) as of 06/16/2021 13:52  Ref. Range 06/15/2021 11:34  Calcium Ionized Latest Ref Range: 4.8 - 5.6 mg/dL 5.45  VITD Latest Ref Range: 30.00 - 100.00 ng/mL 25.86 (L)  PTH, Intact Latest Ref Range: 16 - 77 pg/mL 42  TSH Latest Ref Range: 0.35 - 5.50 uIU/mL 1.21    Results for Deanna Rivera, Deanna Rivera (MRN [id number]) as of 06/15/2021 10:30  Ref. Range 05/12/2021 14:48  Sodium Latest Ref Range: 135 - 145 mmol/L 134 (L)  Potassium Latest Ref Range: 3.5 - 5.1 mmol/L 4.7  Chloride Latest Ref Range: 98 - 111 mmol/L 99  CO2 Latest Ref Range: 22 - 32 mmol/L 26  Glucose Latest Ref Range: 70 - 99 mg/dL 102 (H)  BUN Latest Ref Range: 8 - 23 mg/dL 17  Creatinine Latest Ref Range: 0.44 - 1.00 mg/dL 0.34 (L)  Calcium Latest Ref Range: 8.9 -  10.3 mg/dL 10.0  Anion gap Latest Ref Range: 5 - 15  9  Alkaline Phosphatase Latest Ref Range: 38 - 126 U/L 54  Albumin Latest Ref Range: 3.5 - 5.0 g/dL 3.6  AST Latest Ref Range: 15 - 41 U/L 65 (H)  ALT Latest Ref Range: 0 - 44 U/L 43  Total Protein Latest Ref Range: 6.5 - 8.1 g/dL 7.5  Total Bilirubin Latest Ref Range: 0.3 - 1.2 mg/dL 0.5  GFR, Estimated Latest Ref Range: >60 mL/min >60       X-Ray 03/28/2021 T8 moderate compression fracture again noted. Slight progression from prior CT of 01/30/2021 cannot be excluded. No new compression fracture noted.  ASSESSMENT/PLAN/RECOMMENDATIONS:   Osteoporosis :  - Multifactorial including gender, race, age and chronic steroid use over the years  - She DECLINED DXA today  - We discussed importance of anti-resorptive therapy to reduce her VERY high risk of recurrent fractures - Pt would like to do her own research and discuss with pharmacist - I have recommended the following medications in order of more benefits to her :1. Forteo 2. Prolia 3. Alendronate   - Pt will contact our office when she is ready to initiate treatment  - She is not on calcium tablets but consumes calcium through her diet 3 glasses of milk + yogurt daily  - PTH, iCa normal   Medications : Consume 1200 mg dietary calcium   2. Hashimoto's Thyroiditis :  - TSh is normal  - Pt under the impression that more testing for her thyroid is needed, we discussed that the TSH is sufficient to make  appropriate adjustments.   Continue Levothyroxine 112 mcg daily     3. Vitamin D Insufficiency    Pt to start OTC Vitamin D3 1000 iu daily  F/U will be scheduled once she is ready to start medications   Signed electronically by: Mack Guise, MD  [facility]  [address], [place] [address] Phone: [phone]  FAX: [phone]   CC: [name], [place]  [address] Phone: [phone] Fax: [phone]   Return to Endocrinology clinic as below: Future Appointments  Date Time Provider [place]  07/25/2021 10:10 AM Gatha Mayer, MD [facility]  08/09/2021  8:15 AM [facility] DEVICE REMOTES [facility]  11/08/2021  8:15 AM [facility] DEVICE REMOTES [facility]  02/07/2022  8:15 AM [facility] DEVICE REMOTES [facility]

## 2021-06-16 LAB — CALCIUM, IONIZED: Calcium, Ion: 5.45 mg/dL (ref 4.8–5.6)

## 2021-06-16 LAB — PARATHYROID HORMONE, INTACT (NO CA): PTH: 42 pg/mL (ref 16–77)

## 2021-06-16 NOTE — Telephone Encounter (Signed)
Pt called regarding the order for a walker. She stated that she has not heard anything about the order. She would like a call back. Please Advise.

## 2021-06-19 ENCOUNTER — Other Ambulatory Visit: Payer: Self-pay

## 2021-06-19 DIAGNOSIS — R2681 Unsteadiness on feet: Secondary | ICD-10-CM

## 2021-06-19 DIAGNOSIS — R2689 Other abnormalities of gait and mobility: Secondary | ICD-10-CM

## 2021-06-19 DIAGNOSIS — R296 Repeated falls: Secondary | ICD-10-CM

## 2021-06-19 NOTE — Telephone Encounter (Signed)
Spoke with patient, let her know that I placed a DME order for her walker through [facility]. They should be contacting her. Gave a verbal understanding

## 2021-06-22 NOTE — Telephone Encounter (Signed)
Order has already been sent to [facility], patient can let them know she does not want it when they reach out to her.

## 2021-06-22 NOTE — Telephone Encounter (Signed)
Patient called in stating that she no longer wants the walker.

## 2021-06-27 ENCOUNTER — Other Ambulatory Visit: Payer: Self-pay | Admitting: Family Medicine

## 2021-07-05 ENCOUNTER — Ambulatory Visit (INDEPENDENT_AMBULATORY_CARE_PROVIDER_SITE_OTHER): Payer: Medicare Other | Admitting: Internal Medicine

## 2021-07-05 ENCOUNTER — Other Ambulatory Visit: Payer: Self-pay

## 2021-07-05 VITALS — BP 134/70 | HR 2 | Ht 64.0 in | Wt 131.0 lb

## 2021-07-05 DIAGNOSIS — R001 Bradycardia, unspecified: Secondary | ICD-10-CM | POA: Diagnosis not present

## 2021-07-05 DIAGNOSIS — I441 Atrioventricular block, second degree: Secondary | ICD-10-CM

## 2021-07-05 DIAGNOSIS — I10 Essential (primary) hypertension: Secondary | ICD-10-CM

## 2021-07-05 DIAGNOSIS — I872 Venous insufficiency (chronic) (peripheral): Secondary | ICD-10-CM

## 2021-07-05 DIAGNOSIS — I340 Nonrheumatic mitral (valve) insufficiency: Secondary | ICD-10-CM

## 2021-07-05 DIAGNOSIS — I4819 Other persistent atrial fibrillation: Secondary | ICD-10-CM

## 2021-07-05 MED ORDER — APIXABAN 2.5 MG PO TABS: 2.5000 mg | ORAL_TABLET | Freq: Two times a day (BID) | ORAL | 3 refills | Status: DC

## 2021-07-05 NOTE — Progress Notes (Signed)
PCP: [name], MD Primary Cardiologist: Dr Edison Simon Primary EP:  Dr Rayann Heman  Deanna Rivera is a 87 y.o. female who presents today for electrophysiology followup.  She fell in September.  ER visit is reviewed.  She has had several falls.  She has chronic BLE edema/ venous insufficiency for which she presents today. Today, she denies symptoms of palpitations, chest pain, shortness of breath,  dizziness, presyncope, or syncope.  The patient is otherwise without complaint today.   Past Medical History:  Diagnosis Date   Cataract    Hypertension    Moderate aortic stenosis    Moderate mitral regurgitation    Persistent atrial fibrillation (HCC)    PMR (polymyalgia rheumatica) (HCC)    Second degree AV block    Sick sinus syndrome ([place])    Thyroid disease    Past Surgical History:  Procedure Laterality Date   ABDOMINAL HYSTERECTOMY     CESAREAN SECTION     CHOLECYSTECTOMY     PACEMAKER GENERATOR CHANGE  11/20/2018   [employer] Accolade MRI PPM implanted in [place]- all systems are reviewed and negative except as per HPI above  Current Outpatient Medications  Medication Sig Dispense Refill   acetaminophen (TYLENOL) 500 MG tablet Take 500 mg by mouth every 6 (six) hours as needed for mild pain or headache.     dabigatran (PRADAXA) 150 MG CAPS capsule TAKE 1 CAPSULE BY MOUTH  EVERY 12 HOURS 180 capsule 3   digoxin (LANOXIN) 0.125 MG tablet TAKE 1 TABLET BY MOUTH  DAILY 90 tablet 3   digoxin (LANOXIN) 0.125 MG tablet Take 125 mcg by mouth daily.     dorzolamide-timolol (COSOPT) 22.3-6.8 MG/ML ophthalmic solution Place 1 drop into both eyes 2 (two) times daily.     hydrochlorothiazide (HYDRODIURIL) 25 MG tablet TAKE 1 TABLET BY MOUTH  DAILY (Patient taking differently: Take 12.5 mg by mouth daily.) 90 tablet 3   latanoprost (XALATAN) 0.005 % ophthalmic solution Place 1 drop into both eyes at bedtime.      levothyroxine (SYNTHROID) 112 MCG tablet TAKE 1 TABLET BY MOUTH  DAILY (Patient taking differently: Take 112 mcg by mouth daily before breakfast.) 90 tablet 3   LUMIGAN 0.01 % SOLN Place 1 drop into both eyes at bedtime.     metoprolol succinate (TOPROL-XL) 50 MG 24 hr tablet TAKE 1 TABLET BY MOUTH  DAILY 90 tablet 3   PRADAXA 150 MG CAPS capsule Take 150 mg by mouth in the morning and at bedtime.     predniSONE (DELTASONE) 1 MG tablet TAKE 1 TABLET BY MOUTH  DAILY (Patient taking differently: Take 1 mg by mouth daily with breakfast.) 90 tablet 3   predniSONE (DELTASONE) 1 MG tablet Take 1 mg by mouth daily with breakfast.     traZODone (DESYREL) 50 MG tablet Take 0.5-1 tablets (25-50 mg total) by mouth at bedtime as needed for sleep. 30 tablet 3   No current facility-administered medications for this visit.    Physical Exam: Vitals:   07/05/21 1527  BP: 134/70  Pulse: (!) 2  SpO2: 97%  Weight: 131 lb (59.4 kg)  Height: 5\' 4"  (1.626 m)    GEN- The patient is elderly  appearing, alert and oriented x 3 today.   Head- normocephalic, atraumatic Eyes-  Sclera clear, conjunctiva pink Ears- hearing intact Oropharynx- clear Lungs- Clear to ausculation bilaterally, normal work of  breathing Chest- pacemaker pocket is well healed Heart- irregular rate and rhythm, 2/6 SEM LUSB GI- soft, NT, ND, + BS Extremities- no clubbing, cyanosis, + edema which appears similar to prior visits  Pacemaker interrogation- reviewed in detail today,  See PACEART report  Echo 01/30/21 from [facility] is reviewed.  EF 55%, mild MR, moderate RA enlargement  ekg tracing ordered today is personally reviewed and shows afib, demand V pacing  Assessment and Plan:  1. Symptomatic sinus bradycardia and complete heart block Normal pacemaker function See Pace Art report No changes today she is not device dependant today  2. Permanent afib Rate controlled On pradaxa Given advanced age, I have  advised that she switch to eliquis 2.5mg  BID She is willing to switch but wishes to wait until after she uses up her current pradaxa supply.  3. HTN Stable No change required today  4. Mild MR Echo from 6/22 reviewed (above)  5. Venous insufficiency Sodium restriction Declines to wear support hose  Risks, benefits and potential toxicities for medications prescribed and/or refilled reviewed with patient today.   Return in 6 months Should probably establish with general cardiology going forward  Thompson Grayer MD, [facility] 07/05/2021 3:38 PM

## 2021-07-05 NOTE — Patient Instructions (Addendum)
Medication Instructions:  Stop Pradaxa  Start Eliquis 2.5 mg two times a day Your physician recommends that you continue on your current medications as directed. Please refer to the Current Medication list given to you today. *If you need a refill on your cardiac medications before your next appointment, please call your pharmacy*  Lab Work: None. If you have labs (blood work) drawn today and your tests are completely normal, you will receive your results only by: [place] (if you have MyChart) OR A paper copy in the mail If you have any lab test that is abnormal or we need to change your treatment, we will call you to review the results.  Testing/Procedures: None.  Follow-Up: At [facility], you and your health needs are our priority.  As part of our continuing mission to provide you with exceptional heart care, we have created designated Provider Care Teams.  These Care Teams include your primary Cardiologist (physician) and Advanced Practice Providers (APPs -  Physician Assistants and Nurse Practitioners) who all work together to provide you with the care you need, when you need it.  Your physician wants you to follow-up in: 6 months with Dr. Knox Saliva will receive a reminder letter in the mail two months in advance. If you don't receive a letter, please call our office to schedule the follow-up appointment.  We recommend signing up for the patient portal called "MyChart".  Sign up information is provided on this After Visit Summary.  MyChart is used to connect with patients for Virtual Visits (Telemedicine).  Patients are able to view lab/test results, encounter notes, upcoming appointments, etc.  Non-urgent messages can be sent to your provider as well.   To learn more about what you can do with MyChart, go to [email or web address].    Any Other Special Instructions Will Be Listed Below (If Applicable).

## 2021-07-10 ENCOUNTER — Telehealth: Payer: Self-pay

## 2021-07-10 NOTE — Telephone Encounter (Signed)
Pt called stating that she needs a letter from Dr [name] stating that she cannot go to jury duty. Pt stated that the letter has to be detailed why she cannot attend jury duty. Pt stated that is for 08/17/21. Pt would like the letter mailed to her. Please Advise.

## 2021-07-10 NOTE — Telephone Encounter (Signed)
Please advise 

## 2021-07-12 NOTE — Telephone Encounter (Signed)
Spoke with patient, let her know the letter was created. Will mail letter to patient

## 2021-07-25 ENCOUNTER — Telehealth: Payer: Self-pay | Admitting: Internal Medicine

## 2021-07-25 ENCOUNTER — Telehealth: Payer: Self-pay | Admitting: Family Medicine

## 2021-07-25 ENCOUNTER — Telehealth: Payer: Self-pay

## 2021-07-25 ENCOUNTER — Ambulatory Visit: Payer: PRIVATE HEALTH INSURANCE | Admitting: Internal Medicine

## 2021-07-25 NOTE — Telephone Encounter (Signed)
Good Morning Dr. Carlean Purl,     Patient called to cancel appointment with you this morning at 10:10 due to feeling sick.   Patient was rescheduled for 12/13 with Dr. Candis Schatz.

## 2021-07-25 NOTE — Telephone Encounter (Signed)
Spoke with patient, she refuses to try the trazodone. Continues to tell me she's been told by many people it does NOT work. She is requesting oxycodone or ambien. I let her know that oxycodone is not for sleep it is for pain, she replied "well I am in pain I just want to sleep for when my family comes for christmas" I let patient know those are controlled medications, and will send message to PCP about what to do moving forward. She has been told that Lorrin Mais will "keep her alive"

## 2021-07-25 NOTE — Chronic Care Management (AMB) (Signed)
Care Management  Note   07/25/2021 Name: Nikitha Mode MRN: [id number] DOB: 07-26-1933  Deanna Rivera is a 87 y.o. year old female who is a primary care patient of [name], MD. The care management team was consulted for assistance with chronic disease management and care coordination needs.   Ms. Roat was given information about Care Management services today including:  CCM service includes personalized support from designated clinical staff supervised by the physician, including individualized plan of care and coordination with other care providers 24/7 contact phone numbers for assistance for urgent and routine care needs. Service will only be billed when office clinical staff spend 20 minutes or more in a month to coordinate care. Only one practitioner may furnish and bill the service in a calendar month. The patient may stop CCM services at amy time (effective at the end of the month) by phone call to the office staff. The patient will be responsible for cost sharing (co-pay) or up to 20% of the service fee (after annual deductible is met)  Patient agreed to services and verbal consent obtained.  Follow up plan:   An initial telephone outreach has been scheduled for: 09/05/21 $RemoveBefo'@11am'[id number]$   .sig

## 2021-07-25 NOTE — Telephone Encounter (Signed)
OK Thanks No charge

## 2021-07-25 NOTE — Chronic Care Management (AMB) (Signed)
Care Management  Note   07/25/2021 Name: Deanna Rivera MRN: [id number] DOB: [birth year]  Deanna Rivera is a 87 y.o. year old female who is a primary care patient of [name], MD. The care management team was consulted for assistance with chronic disease management and care coordination needs.   [name] was given information about Care Management services today including:  CCM service includes personalized support from designated clinical staff supervised by the physician, including individualized plan of care and coordination with other care providers 24/7 contact phone numbers for assistance for urgent and routine care needs. Service will only be billed when office clinical staff spend 20 minutes or more in a month to coordinate care. Only one practitioner may furnish and bill the service in a calendar month. The patient may stop CCM services at amy time (effective at the end of the month) by phone call to the office staff. The patient will be responsible for cost sharing (co-pay) or up to 20% of the service fee (after annual deductible is met)  Patient agreed to services and verbal consent obtained.  Follow up plan:   An initial telephone outreach has been scheduled for: 09/05/21 $RemoveBefo'@11am'[id number]$   [employer]

## 2021-07-25 NOTE — Telephone Encounter (Signed)
Patient is calling in stating that she is not sleeping, Dr.Andy gave her Trazodone she claims it is not a "sleeping" medication so she is not going to take it. Patient would like a different medication sent in to help with her sleeping.

## 2021-08-09 ENCOUNTER — Emergency Department (HOSPITAL_BASED_OUTPATIENT_CLINIC_OR_DEPARTMENT_OTHER): Payer: Medicare Other | Admitting: Radiology

## 2021-08-09 ENCOUNTER — Emergency Department (HOSPITAL_BASED_OUTPATIENT_CLINIC_OR_DEPARTMENT_OTHER): Payer: Medicare Other

## 2021-08-09 ENCOUNTER — Other Ambulatory Visit: Payer: Self-pay

## 2021-08-09 ENCOUNTER — Encounter (HOSPITAL_BASED_OUTPATIENT_CLINIC_OR_DEPARTMENT_OTHER): Payer: Self-pay | Admitting: *Deleted

## 2021-08-09 ENCOUNTER — Emergency Department (HOSPITAL_BASED_OUTPATIENT_CLINIC_OR_DEPARTMENT_OTHER)
Admission: EM | Admit: 2021-08-09 | Discharge: 2021-08-10 | Disposition: A | Discharge status: Home / Self Care | Payer: Medicare Other | Attending: Emergency Medicine | Admitting: Emergency Medicine

## 2021-08-09 ENCOUNTER — Ambulatory Visit (INDEPENDENT_AMBULATORY_CARE_PROVIDER_SITE_OTHER): Payer: Medicare Other

## 2021-08-09 DIAGNOSIS — Z79899 Other long term (current) drug therapy: Secondary | ICD-10-CM | POA: Diagnosis not present

## 2021-08-09 DIAGNOSIS — W010XXA Fall on same level from slipping, tripping and stumbling without subsequent striking against object, initial encounter: Secondary | ICD-10-CM | POA: Insufficient documentation

## 2021-08-09 DIAGNOSIS — I10 Essential (primary) hypertension: Secondary | ICD-10-CM

## 2021-08-09 DIAGNOSIS — S8390XA Sprain of unspecified site of unspecified knee, initial encounter: Secondary | ICD-10-CM

## 2021-08-09 DIAGNOSIS — Z7901 Long term (current) use of anticoagulants: Secondary | ICD-10-CM | POA: Insufficient documentation

## 2021-08-09 DIAGNOSIS — E039 Hypothyroidism, unspecified: Secondary | ICD-10-CM | POA: Insufficient documentation

## 2021-08-09 DIAGNOSIS — I495 Sick sinus syndrome: Secondary | ICD-10-CM

## 2021-08-09 DIAGNOSIS — W19XXXA Unspecified fall, initial encounter: Secondary | ICD-10-CM

## 2021-08-09 DIAGNOSIS — Z950 Presence of cardiac pacemaker: Secondary | ICD-10-CM | POA: Insufficient documentation

## 2021-08-09 DIAGNOSIS — R519 Headache, unspecified: Secondary | ICD-10-CM | POA: Diagnosis not present

## 2021-08-09 DIAGNOSIS — M25461 Effusion, right knee: Secondary | ICD-10-CM | POA: Insufficient documentation

## 2021-08-09 DIAGNOSIS — M25561 Pain in right knee: Secondary | ICD-10-CM | POA: Diagnosis present

## 2021-08-09 DIAGNOSIS — I4819 Other persistent atrial fibrillation: Secondary | ICD-10-CM | POA: Insufficient documentation

## 2021-08-09 LAB — BASIC METABOLIC PANEL
Anion gap: 7 (ref 5–15)
BUN: 20 mg/dL (ref 8–23)
CO2: 31 mmol/L (ref 22–32)
Calcium: 10.2 mg/dL (ref 8.9–10.3)
Chloride: 98 mmol/L (ref 98–111)
Creatinine, Ser: 0.34 mg/dL — ABNORMAL LOW (ref 0.44–1.00)
GFR, Estimated: >60 mL/min (ref >60)
Glucose, Bld: 104 mg/dL — ABNORMAL HIGH (ref 70–99)
Potassium: 3.7 mmol/L (ref 3.5–5.1)
Sodium: 136 mmol/L (ref 135–145)

## 2021-08-09 LAB — CUP PACEART REMOTE DEVICE CHECK
Battery Remaining Longevity: 162 mo
Battery Remaining Percentage: 100 %
Brady Statistic RA Percent Paced: 0 %
Brady Statistic RV Percent Paced: 75 %
Date Time Interrogation Session: 20221228064100
Implantable Lead Implant Date: 20130305
Implantable Lead Implant Date: 20130305
Implantable Lead Location: 753859
Implantable Lead Location: 753860
Implantable Lead Model: 4469
Implantable Lead Model: 4470
Implantable Lead Serial Number: 546229
Implantable Lead Serial Number: 712016
Implantable Pulse Generator Implant Date: 20200409
Lead Channel Impedance Value: 427 Ohm
Lead Channel Pacing Threshold Amplitude: 0.6 V
Lead Channel Pacing Threshold Pulse Width: 0.4 ms
Lead Channel Setting Pacing Amplitude: 1.2 V
Lead Channel Setting Pacing Pulse Width: 0.4 ms
Lead Channel Setting Sensing Sensitivity: 0.6 mV
Pulse Gen Serial Number: 859007

## 2021-08-09 LAB — CBC WITH DIFFERENTIAL/PLATELET
Abs Immature Granulocytes: 0.01 10*3/uL (ref 0.00–0.07)
Basophils Absolute: 0 10*3/uL (ref 0.0–0.1)
Basophils Relative: 1 %
Eosinophils Absolute: 0.1 10*3/uL (ref 0.0–0.5)
Eosinophils Relative: 1 %
HCT: 42.1 % (ref 36.0–46.0)
Hemoglobin: 13.8 g/dL (ref 12.0–15.0)
Immature Granulocytes: 0 %
Lymphocytes Relative: 37 %
Lymphs Abs: 2.7 10*3/uL (ref 0.7–4.0)
MCH: 31.2 pg (ref 26.0–34.0)
MCHC: 32.8 g/dL (ref 30.0–36.0)
MCV: 95.2 fL (ref 80.0–100.0)
Monocytes Absolute: 0.6 10*3/uL (ref 0.1–1.0)
Monocytes Relative: 9 %
Neutro Abs: 3.8 10*3/uL (ref 1.7–7.7)
Neutrophils Relative %: 52 %
Platelets: 224 10*3/uL (ref 150–400)
RBC: 4.42 MIL/uL (ref 3.87–5.11)
RDW: 13.2 % (ref 11.5–15.5)
WBC: 7.2 10*3/uL (ref 4.0–10.5)
nRBC: 0 % (ref 0.0–0.2)

## 2021-08-09 MED ORDER — CLONIDINE HCL 0.1 MG PO TABS: 0.1000 mg | ORAL_TABLET | Freq: Once | ORAL | Status: DC

## 2021-08-09 MED ORDER — HYDROCHLOROTHIAZIDE 25 MG PO TABS
25.0000 mg | ORAL_TABLET | Freq: Once | ORAL | Status: AC
  Administered 2021-08-09: 21:00:00 25 mg via ORAL
  Filled 2021-08-09: qty 1

## 2021-08-09 MED ORDER — METOPROLOL SUCCINATE ER 25 MG PO TB24
50.0000 mg | ORAL_TABLET | Freq: Once | ORAL | Status: AC
  Administered 2021-08-09: 21:00:00 50 mg via ORAL
  Filled 2021-08-09: qty 2

## 2021-08-09 NOTE — Discharge Instructions (Addendum)
Talk to your doctors about medicines to help with sleep.  Most of the medicines will make you more unsteady also.  Iron on add him on right now if it could make you more likely to fall. Also talked to doctors about your high blood pressure and the anticoagulation with your frequent and recurrent falls

## 2021-08-09 NOTE — ED Provider Notes (Signed)
[facility] EMERGENCY DEPT Provider Note   CSN: [id number] Arrival date & time: 08/09/21  1556     History Chief Complaint  Patient presents with   Deanna Rivera is a 87 y.o. female with past medical history significant for aortic stenosis, hypertension, sick sinus syndrome with pacemaker, A. fib currently anticoagulated who presents for evaluation of trip and fall.  Patient states she only stands to pivot at baseline, does not ambulate.  She was at therapy earlier today when she states she tripped and fell forward.  Denies hitting her head, LOC or anticoagulation.  Has had pain to bilateral knees.  In triage she admitted to some neck pain, subsequently obtain some imaging.  Initially said she was just here for the fall however states she was actually sent in for her to hypertension as her blood pressure was significantly elevated at therapy, greater than 027 systolic, typically is 253-664 systolic.  Initially she denied any headache in triage, additional evaluation she states she now has a headache and feels "woozy."  Denies any chest pain, shortness of breath, back pain, syncope, paresthesias, weakness.  Denies additional aggravating or alleviating factors. Hx of frequent falls due to chronic unsteady gait.   Patient on initial evaluation is very upset that she had a 5-hour wait in the waiting room.  States she was not able to take her home blood pressure medication.  States that "no one checked on me."  Of note she was out in the waiting room with her husband.  History obtained from patient and past medical records.  No interpreter is used.   HPI     Past Medical History:  Diagnosis Date   Cataract    Hypertension    Moderate aortic stenosis    Moderate mitral regurgitation    Persistent atrial fibrillation (HCC)    PMR (polymyalgia rheumatica) (HCC)    Second degree AV block    Sick sinus syndrome ([place])    Thyroid disease     Patient Active Problem  List   Diagnosis Date Noted   Hashimoto's thyroiditis 06/15/2021   History of vitamin D deficiency 06/15/2021   Frequent falls 05/17/2021   Age-related osteoporosis with current pathological fracture 02/06/2021   Compression fracture of body of thoracic vertebra ([place]) 02/06/2021   Primary osteoarthritis of right knee 10/23/2019   Neuralgia, postherpetic 07/02/2019   OAB (overactive bladder) 07/02/2019   SSS (sick sinus syndrome) ([place]) 02/11/2019   Constipation 01/18/2019   Status post placement of cardiac pacemaker 07/08/2018   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   Shellfish allergy 11/22/2015   Nonrheumatic mitral valve regurgitation 09/16/2015   Nontoxic multinodular goiter 09/08/2015   Current use of long term anticoagulation 05/16/2015   Recurrent UTI 03/14/2015   (HFpEF) heart failure with preserved ejection fraction ([place]) 09/13/2014   Pacemaker 11/09/2011   Impaired fasting glucose 05/23/2009   Synovial cyst of popliteal space 11/22/2008   Long term current use of systemic steroids 03/30/2008   Polymyalgia rheumatica ([place]) 03/30/2008   Benign essential hypertension 05/05/2007   Persistent atrial fibrillation ([place]) 11/06/2006   Pure hypercholesterolemia 11/06/2006   Hypothyroidism due to Hashimoto's thyroiditis, Rx Levothyroxine 06/25/2005    Past Surgical History:  Procedure Laterality Date   ABDOMINAL HYSTERECTOMY     CESAREAN SECTION     CHOLECYSTECTOMY     PACEMAKER GENERATOR CHANGE  11/20/2018   [employer] Accolade MRI PPM implanted in [place]  OB History   No obstetric history on file.     Family History  Problem Relation Age of Onset   Early death Mother 28   Hypertension Brother    Cirrhosis Father 58   Lung cancer Brother 36   Scleroderma Daughter     Social History   Tobacco Use   Smoking status: Never   Smokeless tobacco: Never  Substance Use Topics   Alcohol use: Not Currently    Drug use: Not Currently    Home Medications Prior to Admission medications   Medication Sig Start Date End Date Taking? Authorizing Provider  acetaminophen (TYLENOL) 500 MG tablet Take 500 mg by mouth every 6 (six) hours as needed for mild pain or headache.    [provider]  apixaban (ELIQUIS) 2.5 MG TABS tablet Take 1 tablet (2.5 mg total) by mouth 2 (two) times daily. 07/05/21   Allred, Jeneen Rinks, MD  digoxin (LANOXIN) 0.125 MG tablet TAKE 1 TABLET BY MOUTH  DAILY 06/09/21   Allred, Jeneen Rinks, MD  digoxin (LANOXIN) 0.125 MG tablet Take 125 mcg by mouth daily. 04/08/21   [provider]  dorzolamide-timolol (COSOPT) 22.3-6.8 MG/ML ophthalmic solution Place 1 drop into both eyes 2 (two) times daily. 03/30/21   [provider]  hydrochlorothiazide (HYDRODIURIL) 25 MG tablet TAKE 1 TABLET BY MOUTH  DAILY Patient taking differently: Take 12.5 mg by mouth daily. 10/31/20   [name], MD  latanoprost (XALATAN) 0.005 % ophthalmic solution Place 1 drop into both eyes at bedtime. 03/13/21   [provider]  levothyroxine (SYNTHROID) 112 MCG tablet TAKE 1 TABLET BY MOUTH  DAILY Patient taking differently: Take 112 mcg by mouth daily before breakfast. 12/30/20   [name], MD  LUMIGAN 0.01 % SOLN Place 1 drop into both eyes at bedtime. 08/15/20   [provider]  metoprolol succinate (TOPROL-XL) 50 MG 24 hr tablet TAKE 1 TABLET BY MOUTH  DAILY 06/27/21   [name], MD  predniSONE (DELTASONE) 1 MG tablet TAKE 1 TABLET BY MOUTH  DAILY Patient taking differently: Take 1 mg by mouth daily with breakfast. 09/20/20   [name], MD  predniSONE (DELTASONE) 1 MG tablet Take 1 mg by mouth daily with breakfast. 02/28/21   [provider]  traZODone (DESYREL) 50 MG tablet Take 0.5-1 tablets (25-50 mg total) by mouth at bedtime as needed for sleep. 03/26/21   [name], MD    Allergies    Shellfish-derived products, Epinephrine, Monosodium  glutamate, Ciprofloxacin, Ciprofloxacin, Epinephrine, Erythromycin, Erythromycin, Monosodium glutamate, Shellfish allergy, Sulfa antibiotics, Sulfamethoxazole-trimethoprim, Tetracyclines & related, and Tetracyclines & related  Review of Systems   Review of Systems  Constitutional: Negative.   HENT: Negative.    Respiratory: Negative.    Cardiovascular: Negative.   Gastrointestinal: Negative.   Genitourinary: Negative.   Musculoskeletal: Negative.   Neurological:  Positive for light-headedness and headaches. Negative for dizziness, tremors, seizures, syncope, facial asymmetry, speech difficulty, weakness and numbness.  All other systems reviewed and are negative.  Physical Exam Updated Vital Signs BP (!) 172/139 (BP Location: Left Arm)    Pulse 63    Temp 98.4 F (36.9 C) (Oral)    Resp 16    Ht 5\' 3"  (1.6 m)    Wt 59.9 kg    SpO2 99%    BMI 23.38 kg/m   Physical Exam Vitals and nursing note reviewed.  Constitutional:      General: She is not in acute distress.  Appearance: She is well-developed. She is not ill-appearing, toxic-appearing or diaphoretic.  HENT:     Head: Normocephalic and atraumatic.     Nose: Nose normal.     Mouth/Throat:     Mouth: Mucous membranes are moist.  Eyes:     Pupils: Pupils are equal, round, and reactive to light.  Cardiovascular:     Rate and Rhythm: Normal rate.     Pulses: Normal pulses.          Radial pulses are 2+ on the right side and 2+ on the left side.       Dorsalis pedis pulses are 2+ on the right side and 2+ on the left side.     Heart sounds: Normal heart sounds.  Pulmonary:     Effort: Pulmonary effort is normal. No respiratory distress.     Breath sounds: Normal breath sounds.  Abdominal:     General: Bowel sounds are normal. There is no distension.     Palpations: Abdomen is soft.     Tenderness: There is no abdominal tenderness. There is no guarding or rebound.  Musculoskeletal:        General: Normal range of motion.      Cervical back: Normal range of motion.     Comments: No midline C/T/L tenderness.  Pelvis stable, nontender palpation.  She has diffuse tenderness to right knee, distal femur.  She is able to flex and extend at bilateral lower extremities.  Does have right knee effusion.  Nontender bilateral tib-fib, feet.  Skin:    General: Skin is warm and dry.     Capillary Refill: Capillary refill takes less than 2 seconds.  Neurological:     General: No focal deficit present.     Mental Status: She is alert.     Comments: CN 2-12 grossly intact Equal strength Intact sensation No drift  Psychiatric:        Mood and Affect: Mood normal.    ED Results / Procedures / Treatments   Labs (all labs ordered are listed, but only abnormal results are displayed) Labs Reviewed  CBC WITH DIFFERENTIAL/PLATELET  BASIC METABOLIC PANEL    EKG None  Radiology CT Head Wo Contrast  Result Date: 08/09/2021 CLINICAL DATA:  New or worsening headache with high blood pressure. EXAM: CT HEAD WITHOUT CONTRAST TECHNIQUE: Contiguous axial images were obtained from the base of the skull through the vertex without intravenous contrast. COMPARISON:  None. FINDINGS: Brain: No evidence of acute infarction, hemorrhage, hydrocephalus, extra-axial collection or mass lesion/mass effect. Mild diffuse cerebral atrophy. Mild low-attenuation change in the deep white matter consistent with small vessel ischemia. Vascular: No hyperdense vessel or unexpected calcification. Skull: Calvarium appears intact. Sinuses/Orbits: Paranasal sinuses and mastoid air cells are clear. Other: None. IMPRESSION: No acute intracranial abnormalities. Mild chronic atrophy and small vessel ischemic changes. Electronically Signed   By: Lucienne Capers M.D.   On: 08/09/2021 21:32   CT Cervical Spine Wo Contrast  Result Date: 08/09/2021 CLINICAL DATA:  Acute neck pain after a fall. Prior cervical surgery. EXAM: CT CERVICAL SPINE WITHOUT CONTRAST TECHNIQUE:  Multidetector CT imaging of the cervical spine was performed without intravenous contrast. Multiplanar CT image reconstructions were also generated. COMPARISON:  MRI cervical spine 02/18/2006 FINDINGS: Alignment: There is slight anterior subluxation of C2 on C3 and C3 on C4, similar to prior study and likely degenerative. Normal alignment of the posterior elements. C1-2 articulation appears intact. Skull base and vertebrae: Skull base appears intact. No vertebral compression  deformities. No focal bone lesion or bone destruction. Bone cortex appears intact. Soft tissues and spinal canal: No prevertebral soft tissue swelling. No abnormal paraspinal soft tissue mass or infiltration. Cervical lymph nodes are not pathologically enlarged. Vascular calcifications. Disc levels: Degenerative changes with disc space narrowing and endplate osteophyte formation throughout. Degenerative changes in the facet joints. Upper chest: Visualized lung apices are clear. Other: None. IMPRESSION: 1. Slight anterior subluxations at C2-3 and C3-4, similar to prior study, likely degenerative. 2. No acute displaced fractures identified. 3. Diffuse degenerative changes. Electronically Signed   By: Lucienne Capers M.D.   On: 08/09/2021 17:15   DG Knee Complete 4 Views Right  Result Date: 08/09/2021 CLINICAL DATA:  Fall EXAM: RIGHT KNEE - COMPLETE 4+ VIEW COMPARISON:  04/02/2019, CT 09/15/2019 FINDINGS: No acute displaced fracture or malalignment. Osteochondral lesion at the medial femoral condyle as noted on prior CT. Moderate tricompartment arthritis of the knee with probable trace effusion. Chondrocalcinosis. IMPRESSION: 1. No acute osseous abnormality 2. Moderate tricompartment arthritis of the knee with small effusion. Chondrocalcinosis 3. Medial femoral condyle osteochondral lesion Electronically Signed   By: Donavan Foil M.D.   On: 08/09/2021 17:03   CUP PACEART REMOTE DEVICE CHECK  Result Date: 08/09/2021 Scheduled remote  reviewed. Normal device function.  Next remote 91 days. LR  DG Femur Min 2 Views Right  Result Date: 08/09/2021 CLINICAL DATA:  Trip and fall injury. EXAM: RIGHT FEMUR 2 VIEWS COMPARISON:  Right knee series earlier today. FINDINGS: There is osteopenia without evidence of fractures. There are changes of degenerative arthrosis which are mild-to-moderate at the hip and knee, with a chronic shallow osteochondral defect again noted of the medial femoral condyle. There are calcifications in the femoral artery, small suprapatellar bursal effusion at the knee, and meniscal chondrocalcinosis. IMPRESSION: Osteopenia and degenerative change without evidence of fractures. Additional chronic findings. Suprapatellar bursal effusion. Electronically Signed   By: Telford Nab M.D.   On: 08/09/2021 20:49    Procedures Procedures   Medications Ordered in ED Medications  cloNIDine (CATAPRES) tablet 0.1 mg (has no administration in time range)  metoprolol succinate (TOPROL-XL) 24 hr tablet 50 mg (50 mg Oral Given 08/09/21 2051)  hydrochlorothiazide (HYDRODIURIL) tablet 25 mg (25 mg Oral Given 08/09/21 2051)   ED Course  I have reviewed the triage vital signs and the nursing notes.  Pertinent labs & imaging results that were available during my care of the patient were reviewed by me and considered in my medical decision making (see chart for details).  Pleasant 87 year old here for evaluation of fall.  Apparently had mechanical fall while at provider's office earlier today.  She is currently anticoagulated however denies hitting her head, LOC or anticoagulation.  Caryl Pina has an imaging obtained from triage as her initial triage complaint was just fall however patient subsequently on my initial evaluation states she is actually sent here for hypertension.  On initial evaluation she is very upset that she is not in the waiting room with elevated blood pressure.  She is now states that she has a headache and feels  "woozy."  She is pretty adamant getting her home oral BP meds.  Initially offered IV blood pressure medication however she declined.  She is significantly hypertensive.  Systolics greater than 732K.  I reviewed her past medical records, while she does have a history of hypertension, her blood pressures are significantly elevated from her baseline.  She denies any chest pain, shortness of breath.  She has a nonfocal neuro  exam without deficits.  We will plan on checking labs, imaging  Labs and imaging personally reviewed and interpreted:  CT head without acute abnormality CT cervical without acute findings, similar to prior DG right knee without acute fracture, dislocation DG femur with suprapatellar effusion  Patient is refusing certain blood pressure medication.  States she is willing to take "a small dose of clonidine" as she states she has had this before and has brought her blood pressure down.  We will attempt clonidine and reassess.  Systolic still greater than 200 currently. She would like to hold on IV BP meds currently.    Care transferred to Dr. Alvino Chapel who follow-up on labs, EKG and determine disposition.     MDM Rules/Calculators/A&P                             Final Clinical Impression(s) / ED Diagnoses Final diagnoses:  Fall, initial encounter  Hypertension, unspecified type    Rx / DC Orders ED Discharge Orders     None        Nehal Witting A, PA-C 08/09/21 2143    Davonna Belling, MD 08/10/21 [phone]

## 2021-08-09 NOTE — ED Triage Notes (Addendum)
Pt tripped and fell to knees PTA. Pain na dswelling in rt knee with small abrasion. Left knee seems to be ok. She is on a thinner, did not injure any other part of body. Later states she has neck pain, placed c collar in triage

## 2021-08-10 NOTE — ED Notes (Signed)
Knee immobilizer applied and pt instructed on how to apply; pt and spouse verbalized understanding

## 2021-08-16 ENCOUNTER — Encounter: Payer: Self-pay | Admitting: Gastroenterology

## 2021-08-16 ENCOUNTER — Ambulatory Visit (INDEPENDENT_AMBULATORY_CARE_PROVIDER_SITE_OTHER): Payer: Medicare Other | Admitting: Gastroenterology

## 2021-08-16 VITALS — BP 140/72 | HR 60

## 2021-08-16 DIAGNOSIS — R159 Full incontinence of feces: Secondary | ICD-10-CM

## 2021-08-16 DIAGNOSIS — R152 Fecal urgency: Secondary | ICD-10-CM

## 2021-08-16 NOTE — Patient Instructions (Signed)
If you are age 86 or older, your body mass index should be between 23-30. Your There is no height or weight on file to calculate BMI. If this is out of the aforementioned range listed, please consider follow up with your Primary Care Provider.  If you are age 7 or younger, your body mass index should be between 19-25. Your There is no height or weight on file to calculate BMI. If this is out of the aformentioned range listed, please consider follow up with your Primary Care Provider.   Start metamucil daily with breakfast. Increase to 2-3 times daily if no changes in stool after 2 weeks.  The [facility] providers would like to encourage you to use [facility] to communicate with providers for non-urgent requests or questions.  Due to long hold times on the telephone, sending your provider a message by [facility] may be a faster and more efficient way to get a response.  Please allow 48 business hours for a response.  Please remember that this is for non-urgent requests.   It was a pleasure to see you today!  Thank you for trusting me with your gastrointestinal care!    Scott E.Candis Schatz, MD

## 2021-08-16 NOTE — Progress Notes (Signed)
HPI : Deanna Rivera is a very pleasant 88 year old female with inclusion body myositis with frequent falls, who is referred to [place] by Dr. Billey Chang for further evaluation and management of chronic fecal incontinence.  She states that this started about 6 months ago and used to be an infrequent problem, but now is very common.  She describes her incontinence as having the sudden urge to defecate, followed by the release of a small amount of feces before she is able to get to the bathroom.  This typically occurs shortly after meals, and so she has stopped eating when she knows she will be out of the house.  She typically does not completely evacuate her bowels in these situations.  The incontinent stool is typically solid, not loose/liquid.  She typically has one bowel movement per day.  She denies other symptoms such as abdominal pain/cramps, blood in stool, nausea/vomiting, decreased appetite.  She does report the sensation of infrequent evacuation frequently with bowel movements.  No problems with straining with defecation.  No nocturnal incontinence.  She denies having incontinence in the absence of the urge to defecate.  She also has chronic urinary urgency and frequency and reports seeing pelvic floor physical therapy to help with that.   She is essentially wheel-chair bound now due to frequent falls attributed to her myositis.   Past Medical History:  Diagnosis Date   Cataract    Hypertension    Moderate aortic stenosis    Moderate mitral regurgitation    Persistent atrial fibrillation (HCC)    PMR (polymyalgia rheumatica) (HCC)    Second degree AV block    Sick sinus syndrome (HCC)    Thyroid disease      Past Surgical History:  Procedure Laterality Date   ABDOMINAL HYSTERECTOMY     CESAREAN SECTION     CHOLECYSTECTOMY     PACEMAKER GENERATOR CHANGE  11/20/2018   [employer] Accolade MRI PPM implanted in [place] CYST EXCISION      Family History  Problem Relation Age of Onset   Early death Mother 75   Hypertension Brother    Cirrhosis Father 62   Lung cancer Brother 25   Scleroderma Daughter    Social History   Tobacco Use   Smoking status: Never   Smokeless tobacco: Never  Substance Use Topics   Alcohol use: Not Currently   Drug use: Not Currently   Current Outpatient Medications  Medication Sig Dispense Refill   acetaminophen (TYLENOL) 500 MG tablet Take 500 mg by mouth every 6 (six) hours as needed for mild pain or headache.     apixaban (ELIQUIS) 2.5 MG TABS tablet Take 1 tablet (2.5 mg total) by mouth 2 (two) times daily. 180 tablet 3   digoxin (LANOXIN) 0.125 MG tablet TAKE 1 TABLET BY MOUTH  DAILY 90 tablet 3   digoxin (LANOXIN) 0.125 MG tablet Take 125 mcg by mouth daily.     dorzolamide-timolol (COSOPT) 22.3-6.8 MG/ML ophthalmic solution Place 1 drop into both eyes 2 (two) times daily.     hydrochlorothiazide (HYDRODIURIL) 25 MG tablet TAKE 1 TABLET BY MOUTH  DAILY (Patient taking differently: Take 12.5 mg by mouth daily.) 90 tablet 3   latanoprost (XALATAN) 0.005 % ophthalmic solution Place 1 drop into both eyes at bedtime.     levothyroxine (SYNTHROID) 112 MCG tablet TAKE 1 TABLET BY MOUTH  DAILY (Patient taking differently: Take 112 mcg by mouth daily  before breakfast.) 90 tablet 3   LUMIGAN 0.01 % SOLN Place 1 drop into both eyes at bedtime.     metoprolol succinate (TOPROL-XL) 50 MG 24 hr tablet TAKE 1 TABLET BY MOUTH  DAILY 90 tablet 3   predniSONE (DELTASONE) 1 MG tablet TAKE 1 TABLET BY MOUTH  DAILY (Patient taking differently: Take 1 mg by mouth daily with breakfast.) 90 tablet 3   predniSONE (DELTASONE) 1 MG tablet Take 1 mg by mouth daily with breakfast.     traZODone (DESYREL) 50 MG tablet Take 0.5-1 tablets (25-50 mg total) by mouth at bedtime as needed for sleep. 30 tablet 3   No current facility-administered medications for this visit.   Allergies  Allergen Reactions    Shellfish-Derived Products Shortness Of Breath and Other (See Comments)    Lips go numb   Epinephrine Other (See Comments) and Hypertension    "Makes me feel wired"   Monosodium Glutamate Other (See Comments)    other   Ciprofloxacin Other (See Comments)    Other reaction(s): Intolerance   Ciprofloxacin Other (See Comments)    Allergic reaction not recalled   Epinephrine    Erythromycin    Erythromycin Other (See Comments)    Allergic reaction not recalled   Monosodium Glutamate Other (See Comments) and Hypertension    "Makes me feel badly"   Shellfish Allergy Other (See Comments)   Sulfa Antibiotics Other (See Comments)    Allergic reaction not recalled   Sulfamethoxazole-Trimethoprim    Tetracyclines & Related    Tetracyclines & Related Other (See Comments)    Allergic reaction not recalled     Review of Systems: All systems reviewed and negative except where noted in HPI.    CT Head Wo Contrast  Result Date: 08/09/2021 CLINICAL DATA:  New or worsening headache with high blood pressure. EXAM: CT HEAD WITHOUT CONTRAST TECHNIQUE: Contiguous axial images were obtained from the base of the skull through the vertex without intravenous contrast. COMPARISON:  None. FINDINGS: Brain: No evidence of acute infarction, hemorrhage, hydrocephalus, extra-axial collection or mass lesion/mass effect. Mild diffuse cerebral atrophy. Mild low-attenuation change in the deep white matter consistent with small vessel ischemia. Vascular: No hyperdense vessel or unexpected calcification. Skull: Calvarium appears intact. Sinuses/Orbits: Paranasal sinuses and mastoid air cells are clear. Other: None. IMPRESSION: No acute intracranial abnormalities. Mild chronic atrophy and small vessel ischemic changes. Electronically Signed   By: Lucienne Capers M.D.   On: 08/09/2021 21:32   CT Cervical Spine Wo Contrast  Result Date: 08/09/2021 CLINICAL DATA:  Acute neck pain after a fall. Prior cervical surgery.  EXAM: CT CERVICAL SPINE WITHOUT CONTRAST TECHNIQUE: Multidetector CT imaging of the cervical spine was performed without intravenous contrast. Multiplanar CT image reconstructions were also generated. COMPARISON:  MRI cervical spine 02/18/2006 FINDINGS: Alignment: There is slight anterior subluxation of C2 on C3 and C3 on C4, similar to prior study and likely degenerative. Normal alignment of the posterior elements. C1-2 articulation appears intact. Skull base and vertebrae: Skull base appears intact. No vertebral compression deformities. No focal bone lesion or bone destruction. Bone cortex appears intact. Soft tissues and spinal canal: No prevertebral soft tissue swelling. No abnormal paraspinal soft tissue mass or infiltration. Cervical lymph nodes are not pathologically enlarged. Vascular calcifications. Disc levels: Degenerative changes with disc space narrowing and endplate osteophyte formation throughout. Degenerative changes in the facet joints. Upper chest: Visualized lung apices are clear. Other: None. IMPRESSION: 1. Slight anterior subluxations at C2-3 and C3-4, similar to  prior study, likely degenerative. 2. No acute displaced fractures identified. 3. Diffuse degenerative changes. Electronically Signed   By: Lucienne Capers M.D.   On: 08/09/2021 17:15   DG Knee Complete 4 Views Right  Result Date: 08/09/2021 CLINICAL DATA:  Fall EXAM: RIGHT KNEE - COMPLETE 4+ VIEW COMPARISON:  04/02/2019, CT 09/15/2019 FINDINGS: No acute displaced fracture or malalignment. Osteochondral lesion at the medial femoral condyle as noted on prior CT. Moderate tricompartment arthritis of the knee with probable trace effusion. Chondrocalcinosis. IMPRESSION: 1. No acute osseous abnormality 2. Moderate tricompartment arthritis of the knee with small effusion. Chondrocalcinosis 3. Medial femoral condyle osteochondral lesion Electronically Signed   By: Donavan Foil M.D.   On: 08/09/2021 17:03   CUP PACEART REMOTE DEVICE  CHECK  Result Date: 08/09/2021 Scheduled remote reviewed. Normal device function.  Next remote 91 days. LR  DG Femur Min 2 Views Right  Result Date: 08/09/2021 CLINICAL DATA:  Trip and fall injury. EXAM: RIGHT FEMUR 2 VIEWS COMPARISON:  Right knee series earlier today. FINDINGS: There is osteopenia without evidence of fractures. There are changes of degenerative arthrosis which are mild-to-moderate at the hip and knee, with a chronic shallow osteochondral defect again noted of the medial femoral condyle. There are calcifications in the femoral artery, small suprapatellar bursal effusion at the knee, and meniscal chondrocalcinosis. IMPRESSION: Osteopenia and degenerative change without evidence of fractures. Additional chronic findings. Suprapatellar bursal effusion. Electronically Signed   By: Telford Nab M.D.   On: 08/09/2021 20:49    Physical Exam: BP 140/72, HR 60, pt refused weighing due to concern about falling, states she weighs 131lbs Constitutional: Pleasant,well-developed, Caucasian female in no acute distress.  Seated in wheelchair, accompanied by husband HEENT: Normocephalic and atraumatic. Conjunctivae are normal. No scleral icterus. Cardiovascular: Normal rate, regular rhythm.  PPM in left upper chest wall Pulmonary/chest: Effort normal and breath sounds normal. No wheezing, rales or rhonchi. Abdominal: Soft, nondistended, nontender. Bowel sounds active throughout. There are no masses palpable. No hepatomegaly. Extremities: no edema Rectal:  Deferred by patient Neurological: Alert and oriented to person place and time. Skin: Skin is warm and dry. No rashes noted. Psychiatric: Normal mood and affect. Behavior is normal.  CBC    Component Value Date/Time   WBC 7.2 08/09/2021 2142   RBC 4.42 08/09/2021 2142   HGB 13.8 08/09/2021 2142   HGB 13.7 01/18/2021 1603   HCT 42.1 08/09/2021 2142   HCT 40.3 01/18/2021 1603   PLT 224 08/09/2021 2142   PLT 285 01/18/2021 1603   MCV  95.2 08/09/2021 2142   MCV 93 01/18/2021 1603   MCH 31.2 08/09/2021 2142   MCHC 32.8 08/09/2021 2142   RDW 13.2 08/09/2021 2142   RDW 12.3 01/18/2021 1603   LYMPHSABS 2.7 08/09/2021 2142   MONOABS 0.6 08/09/2021 2142   EOSABS 0.1 08/09/2021 2142   BASOSABS 0.0 08/09/2021 2142    CMP     Component Value Date/Time   NA 136 08/09/2021 2142   NA 134 01/18/2021 1603   K 3.7 08/09/2021 2142   CL 98 08/09/2021 2142   CO2 31 08/09/2021 2142   GLUCOSE 104 (H) 08/09/2021 2142   BUN 20 08/09/2021 2142   BUN 18 01/18/2021 1603   CREATININE 0.34 (L) 08/09/2021 2142   CREATININE 0.34 (L) 07/28/2020 1131   CALCIUM 10.2 08/09/2021 2142   PROT 7.5 05/12/2021 1448   ALBUMIN 3.6 05/12/2021 1448   AST 65 (H) 05/12/2021 1448   ALT 43 05/12/2021 1448  ALKPHOS 54 05/12/2021 1448   BILITOT 0.5 05/12/2021 1448   GFRNONAA >60 08/09/2021 2142   GFRNONAA 100 07/28/2020 1131   GFRAA 116 07/28/2020 1131     ASSESSMENT AND PLAN: 88 year old female with inclusion body myositis with several months of urge fecal incontinence and sensation of incomplete evacuation.  We discussed the principles of management of fecal incontinence which include optimizing stool volume and consistency, as well as strengthening of the pelvic floor.  The patient was not interested in invasive interventions (ie stimulator) at this time.  I recommended that she re-engage with pelvic floor physical therapy for her fecal incontinence, but she did not think PT was very helpful for her urinary symptoms, and she is already seeing PT frequently for her falls.  She has not tried taking Metamucil yet, so I recommended she start taking a tablespoon daily with breakfast daily to improved her stool bulk and consistency.  She can take imodium as needed for when she eats away from home.  Fecal incontinence - Metamucil daily - Loperamide PRN - Recommended pelvic floor PT, declined - Consider sacral nerve stimulator if symptoms not improving  and patient is interested  Camelia Stelzner E. Candis Schatz, MD [facility]  CC:  [name], MD

## 2021-08-17 ENCOUNTER — Telehealth: Payer: Self-pay | Admitting: Internal Medicine

## 2021-08-17 ENCOUNTER — Telehealth: Payer: Self-pay | Admitting: Family Medicine

## 2021-08-17 ENCOUNTER — Other Ambulatory Visit: Payer: Self-pay | Admitting: Family Medicine

## 2021-08-17 NOTE — Telephone Encounter (Signed)
Pt wanted to discuss medication questions. She was switched over to a new medication that she has questions about. Please advise.

## 2021-08-17 NOTE — Telephone Encounter (Signed)
Patient would like to have an appointment with Gerald Stabs if possible to discuss her Eliquis, does not want me to refill the medication at the moment her previous cardiology office switched her medication and her cardiologist left. She wants to discuss the new medication with a pharmacist before starting it.

## 2021-08-17 NOTE — Telephone Encounter (Signed)
°  Pt c/o medication issue:  1. Name of Medication: apixaban (ELIQUIS) 2.5 MG TABS tablet  2. How are you currently taking this medication (dosage and times per day)? Take 1 tablet (2.5 mg total) by mouth 2 (two) times daily.  3. Are you having a reaction (difficulty breathing--STAT)?   4. What is your medication issue? Pt said she is still scared to take thi meds, she wanted to know information about eliquis, she said she was taking pradaxa for years and she only have 5 days worth of pills, she said, she didn't get enough info during her visit with Dr. Rayann Heman. He wanted to get Dr. Tanna Furry thought's and recommendation.

## 2021-08-18 NOTE — Telephone Encounter (Signed)
°  Pt is calling back to f/u, she said if Dr. Lovena Le is not available, a pharmacist can call her back as well

## 2021-08-18 NOTE — Telephone Encounter (Signed)
Pt changed from Pradaxa to Eliquis 07/05/21 visit with Dr Rayann Heman. "2. Permanent afib Rate controlled On pradaxa Given advanced age, I have advised that she switch to eliquis 2.5mg  BID She is willing to switch but wishes to wait until after she uses up her current pradaxa supply."  Called pt and advised her of rationale for switch. She is hesitant since she has taken Pradaxa for years without issues. Discussed that Eliquis has a better safety profile than Pradaxa in elderly patients with a lower risk of bleeding. Pradaxa is also not covered as well by her insurance. She is eventually agreeable to trying Eliquis. Advised her to call back with any issues.

## 2021-08-21 ENCOUNTER — Encounter: Payer: Self-pay | Admitting: Gastroenterology

## 2021-08-22 NOTE — Progress Notes (Signed)
Remote pacemaker transmission.   

## 2021-08-25 NOTE — Progress Notes (Deleted)
Chronic Care Management Pharmacy Note  08/25/2021 Name:  Deanna Rivera MRN:  [id number] DOB:  [birth year]  Summary: ***  Recommendations/Changes made from today's visit: ***  Plan: ***   Subjective: Deanna Rivera is an 88 y.o. year old female who is a primary patient of [name], MD.  The CCM team was consulted for assistance with disease management and care coordination needs.    Engaged with patient by telephone for initial visit in response to provider referral for pharmacy case management and/or care coordination services.   Consent to Services:  The patient was given the following information about Chronic Care Management services today, agreed to services, and gave verbal consent: 1. CCM service includes personalized support from designated clinical staff supervised by the primary care provider, including individualized plan of care and coordination with other care providers 2. 24/7 contact phone numbers for assistance for urgent and routine care needs. 3. Service will only be billed when office clinical staff spend 20 minutes or more in a month to coordinate care. 4. Only one practitioner may furnish and bill the service in a calendar month. 5.The patient may stop CCM services at any time (effective at the end of the month) by phone call to the office staff. 6. The patient will be responsible for cost sharing (co-pay) of up to 20% of the service fee (after annual deductible is met). Patient agreed to services and consent obtained.  Patient Care Team: [name], MD as PCP - General (Family Medicine) Thompson Grayer, MD as PCP - Electrophysiology (Cardiology) Thompson Grayer, MD as Consulting Physician (Cardiology) Georgia Lopes as Physician Assistant (Orthopedic Surgery) Hermelinda Medicus, MD as Consulting Physician (Rheumatology) [name], MD (Family Medicine) Edythe Clarity, [facility] as Pharmacist (Pharmacist)  Recent office visits:  05/17/2021 OV  (PCP) [name], MD; no medication changes indicated.   04/19/2021 OV Allwardt, Alyssa M, PA-C; no medication changes indicated.   03/08/2021 OV (PCP) [name], MD; Please stop calcitonin spray.   Recent consult visits:  08/16/2021 OV Gertie Fey) Daryel November, MD; metamucil daily, Loperamide PRN   07/05/2021 OV (Cardiology) Thompson Grayer, MD; On pradaxa Given advanced age, I have advised that she switch to eliquis 2.65m BID She is willing to switch but wishes to wait until after she uses up her current pradaxa supply.   06/15/2021 OV (Endocrinology) Shamleffer, IMelanie Crazier MD; Pt declines DXA scan - had vertigo with the last one , no medication changes indicated.   04/04/2021 OV (Neurosurgery) MLeisa LenzA, DO; no medication changes indicated.   Hospital visits:  08/09/2021 ED visit for Fall -No medication changes indicated.   05/12/2021 ED visit for Fall -She does have evidence of a nasal bone fracture. -No medication changes indicated.     Objective:  Lab Results  Component Value Date   CREATININE 0.34 (L) 08/09/2021   BUN 20 08/09/2021   GFR 189.54 10/27/2019   GFRNONAA >60 08/09/2021   GFRAA 116 07/28/2020   NA 136 08/09/2021   K 3.7 08/09/2021   CALCIUM 10.2 08/09/2021   CO2 31 08/09/2021   GLUCOSE 104 (H) 08/09/2021    Lab Results  Component Value Date/Time   HGBA1C 5.8 (H) 07/28/2020 11:31 AM   HGBA1C 5.8 10/27/2019 10:10 AM   GFR 189.54 10/27/2019 10:10 AM   GFR 177.35 03/19/2019 01:48 PM    Last diabetic Eye exam: No results found for: HMDIABEYEEXA  Last diabetic Foot exam: No results found for: HMDIABFOOTEX  Lab Results  Component Value Date   CHOL 202 (H) 07/28/2020   HDL 66 07/28/2020   LDLCALC 117 (H) 07/28/2020   TRIG 87 07/28/2020   CHOLHDL 3.1 07/28/2020    Hepatic Function Latest Ref Rng & Units 05/12/2021 01/21/2021 07/28/2020  Total Protein 6.5 - 8.1 g/dL 7.5 7.6 7.5  Albumin 3.5 - 5.0 g/dL 3.6 3.4(L) -  AST 15  - 41 U/L 65(H) 44(H) 34  ALT 0 - 44 U/L 43 30 29  Alk Phosphatase 38 - 126 U/L 54 52 -  Total Bilirubin 0.3 - 1.2 mg/dL 0.5 1.0 0.5  Bilirubin, Direct 0.0 - 0.2 mg/dL - 0.3(H) -    Lab Results  Component Value Date/Time   TSH 1.21 06/15/2021 11:34 AM   TSH 1.85 07/28/2020 11:31 AM   FREET4 1.46 03/19/2019 01:48 PM    CBC Latest Ref Rng & Units 08/09/2021 05/12/2021 05/12/2021  WBC 4.0 - 10.5 K/uL 7.2 6.3 -  Hemoglobin 12.0 - 15.0 g/dL 13.8 13.5 14.3  Hematocrit 36.0 - 46.0 % 42.1 41.5 42.0  Platelets 150 - 400 K/uL 224 227 -    Lab Results  Component Value Date/Time   VD25OH 25.86 (L) 06/15/2021 11:34 AM   VD25OH 34.95 10/27/2019 10:10 AM    Clinical ASCVD: {YES/NO:21197} The ASCVD Risk score (Arnett DK, et al., 2019) failed to calculate for the following reasons:   The 2019 ASCVD risk score is only valid for ages 26 to 22    Depression screen PHQ 2/9 03/08/2021 07/28/2020 11/12/2019  Decreased Interest 3 0 0  Down, Depressed, Hopeless 3 0 0  PHQ - 2 Score 6 0 0  Altered sleeping 0 3 -  Tired, decreased energy 2 3 -  Change in appetite 0 0 -  Feeling bad or failure about yourself  3 0 -  Trouble concentrating 0 0 -  Moving slowly or fidgety/restless 0 0 -  Suicidal thoughts 0 0 -  PHQ-9 Score 11 6 -  Difficult doing work/chores Very difficult - -     ***Other: (CHADS2VASc if Afib, MMRC or CAT for COPD, ACT, DEXA)  Social History   Tobacco Use  Smoking Status Former   Types: Cigarettes  Smokeless Tobacco Never   BP Readings from Last 3 Encounters:  08/16/21 140/72  08/09/21 (!) 165/76  07/05/21 134/70   Pulse Readings from Last 3 Encounters:  08/16/21 60  08/09/21 71  07/05/21 (!) 2   Wt Readings from Last 3 Encounters:  08/09/21 132 lb (59.9 kg)  07/05/21 131 lb (59.4 kg)  06/15/21 132 lb (59.9 kg)   BMI Readings from Last 3 Encounters:  08/09/21 23.38 kg/m  07/05/21 22.49 kg/m  06/15/21 22.66 kg/m    Assessment/Interventions: Review of  patient past medical history, allergies, medications, health status, including review of consultants reports, laboratory and other test data, was performed as part of comprehensive evaluation and provision of chronic care management services.   SDOH:  (Social Determinants of Health) assessments and interventions performed: {yes/no:20286}  SDOH Screenings   Alcohol Screen: Not on file  Depression (PHQ2-9): Medium Risk   PHQ-2 Score: 11  Financial Resource Strain: Not on file  Food Insecurity: Not on file  Housing: Not on file  Physical Activity: Not on file  Social Connections: Not on file  Stress: Not on file  Tobacco Use: Medium Risk   Smoking Tobacco Use: Former   Smokeless Tobacco Use: Never   Passive Exposure: Not on file  Transportation Needs: Not  on file    CCM Care Plan  Allergies  Allergen Reactions   Shellfish-Derived Products Shortness Of Breath and Other (See Comments)    Lips go numb   Epinephrine Other (See Comments) and Hypertension    "Makes me feel wired"   Monosodium Glutamate Other (See Comments)    other   Ciprofloxacin Other (See Comments)    Allergic reaction not recalled   Erythromycin Other (See Comments)    Allergic reaction not recalled   Monosodium Glutamate Other (See Comments) and Hypertension    "Makes me feel badly"   Sulfa Antibiotics Other (See Comments)    Allergic reaction not recalled   Sulfamethoxazole-Trimethoprim    Tetracyclines & Related Other (See Comments)    Allergic reaction not recalled    Medications Reviewed Today     Reviewed by Daryel November, MD (Physician) on 08/21/21 at 0552  Med List Status: <None>   Medication Order Taking? Sig Documenting Provider Last Dose Status Informant  acetaminophen (TYLENOL) 500 MG tablet [id number] Yes Take 500 mg by mouth every 6 (six) hours as needed for mild pain or headache. [provider] Taking Active Self  apixaban (ELIQUIS) 2.5 MG TABS tablet [id number] No Take 1  tablet (2.5 mg total) by mouth 2 (two) times daily.  Patient not taking: Reported on 08/16/2021   Thompson Grayer, MD Not Taking Active   Cholecalciferol (VITAMIN D3) 25 MCG (1000 UT) CAPS [id number] Yes Take 1 capsule by mouth daily. [provider] Taking Active   digoxin (LANOXIN) 0.125 MG tablet [id number] Yes TAKE 1 TABLET BY MOUTH  DAILY Allred, James, MD Taking Active   dorzolamide-timolol (COSOPT) 22.3-6.8 MG/ML ophthalmic solution [id number] Yes Place 1 drop into both eyes 2 (two) times daily. [provider] Taking Active Self  hydrochlorothiazide (HYDRODIURIL) 25 MG tablet [id number] Yes TAKE 1 TABLET BY MOUTH  DAILY  Patient taking differently: Take 12.5 mg by mouth daily.   [name], MD Taking Active   latanoprost (XALATAN) 0.005 % ophthalmic solution [id number] Yes Place 1 drop into both eyes at bedtime. [provider] Taking Active Self  levothyroxine (SYNTHROID) 112 MCG tablet [id number] Yes TAKE 1 TABLET BY MOUTH  DAILY  Patient taking differently: Take 112 mcg by mouth daily before breakfast.   [name], MD Taking Active   metoprolol succinate (TOPROL-XL) 50 MG 24 hr tablet [id number] Yes TAKE 1 TABLET BY MOUTH  DAILY [name], MD Taking Active   predniSONE (DELTASONE) 1 MG tablet [id number]  TAKE 1 TABLET BY MOUTH  DAILY [name], MD  Active             Patient Active Problem List   Diagnosis Date Noted   Hashimoto's thyroiditis 06/15/2021   History of vitamin D deficiency 06/15/2021   Frequent falls 05/17/2021   Age-related osteoporosis with current pathological fracture 02/06/2021   Compression fracture of body of thoracic vertebra ([place]) 02/06/2021   Primary osteoarthritis of right knee 10/23/2019   Neuralgia, postherpetic 07/02/2019   OAB (overactive bladder) 07/02/2019   SSS (sick sinus syndrome) ([place]) 02/11/2019   Constipation 01/18/2019   Status post placement of cardiac pacemaker 07/08/2018   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   Shellfish allergy 11/22/2015   Nonrheumatic mitral valve regurgitation 09/16/2015   Nontoxic multinodular goiter 09/08/2015   Current use of long term anticoagulation 05/16/2015   Recurrent UTI 03/14/2015   (HFpEF) heart failure with preserved ejection fraction ([place]) 09/13/2014   Pacemaker 11/09/2011  Impaired fasting glucose 05/23/2009   Synovial cyst of popliteal space 11/22/2008   Long term current use of systemic steroids 03/30/2008   Polymyalgia rheumatica ([place]) 03/30/2008   Benign essential hypertension 05/05/2007   Persistent atrial fibrillation ([place]) 11/06/2006   Pure hypercholesterolemia 11/06/2006   Hypothyroidism due to Hashimoto's thyroiditis, Rx Levothyroxine 06/25/2005    Immunization History  Administered Date(s) Administered   Fluad Quad(high Dose 65+) 05/13/2020   Moderna Sars-Covid-2 Vaccination 08/12/2019, 09/09/2019, 06/14/2020   Pneumococcal Conjugate-13 04/28/2019   Tdap 05/12/2021    Conditions to be addressed/monitored:  HFpEF, HTN, Afib, Hypothyroidism, HLD, Osteoporosis  There are no care plans that you recently modified to display for this patient.    Medication Assistance: {MEDASSISTANCEINFO:25044}  Compliance/Adherence/Medication fill history: Care Gaps: ***  Star-Rating Drugs: ***  Patient's preferred pharmacy is:  [id number] - [place], [place] [address] Phone: [phone] Fax: [phone]  [facility] ([place], [facility] [address] [place] [address] [place] [address] Phone: [phone] Fax: [phone]  [facility] ([facility] ) - [place], [place] [address] Phone: [phone] Fax: [phone]  Uses pill box? {Yes or If no, why not?:20788} Pt endorses ***% compliance  We discussed: {Pharmacy  options:24294} Patient decided to: {US Pharmacy Plan:23885}  Care Plan and Follow Up Patient Decision:  {FOLLOWUP:24991}  Plan: {CM FOLLOW UP HYIF:02774}  ***  Current Barriers:  {pharmacybarriers:24917}  Pharmacist Clinical Goal(s):  Patient will {PHARMACYGOALCHOICES:24921} through collaboration with PharmD and provider.   Interventions: 1:1 collaboration with [name], MD regarding development and update of comprehensive plan of care as evidenced by provider attestation and co-signature Inter-disciplinary care team collaboration (see longitudinal plan of care) Comprehensive medication review performed; medication list updated in electronic medical record  Hypertension (BP goal {CHL HP UPSTREAM Pharmacist BP [phone]}) -{US controlled/uncontrolled:25276} -Current treatment: *** -Medications previously tried: ***  -Current home readings: *** -Current dietary habits: *** -Current exercise habits: *** -{ACTIONS;DENIES/REPORTS:21021675::"Denies"} hypotensive/hypertensive symptoms -Educated on {CCM BP Counseling:25124} -Counseled to monitor BP at home ***, document, and provide log at future appointments -{CCMPHARMDINTERVENTION:25122}  Hyperlipidemia: (LDL goal < ***) -{US controlled/uncontrolled:25276} -Current treatment: *** -Medications previously tried: ***  -Current dietary patterns: *** -Current exercise habits: *** -Educated on {CCM HLD Counseling:25126} -{CCMPHARMDINTERVENTION:25122}  Atrial Fibrillation (Goal: prevent stroke and major bleeding) -{US controlled/uncontrolled:25276} -CHADSVASC: *** -Current treatment: Rate control: *** Anticoagulation: *** -Medications previously tried: *** -Home BP and HR readings: ***  -Counseled on {CCMAFIBCOUNSELING:25120} -{CCMPHARMDINTERVENTION:25122}  Heart Failure (Goal: manage symptoms and prevent exacerbations) -{US controlled/uncontrolled:25276} -Last ejection fraction: *** (Date: ***) -HF type:  {type of heart failure:30421350} -NYHA Class: {CHL HP Upstream Pharm NYHA Class:[phone]} -AHA HF Stage: {CHL HP Upstream Pharm AHA HF [phone]} -Current treatment: *** -Medications previously tried: ***  -Current home BP/HR readings: *** -Current dietary habits: *** -Current exercise habits: *** -Educated on {CCM HF Counseling:25125} -{CCMPHARMDINTERVENTION:25122}  Osteoporosis / Osteopenia (Goal ***) -{US controlled/uncontrolled:25276} -Last DEXA Scan: ***   T-Score femoral neck: ***  T-Score total hip: ***  T-Score lumbar spine: ***  T-Score forearm radius: ***  10-year probability of major osteoporotic fracture: ***  10-year probability of hip fracture: *** -Patient {is;is not an osteoporosis candidate:23886} -Current treatment  *** -Medications previously tried: ***  -{Osteoporosis Counseling:23892} -{CCMPHARMDINTERVENTION:25122}  Hypothyroidism (Goal: ***) -{US controlled/uncontrolled:25276} -Current treatment  *** -Medications previously tried: ***  -{  CCMPHARMDINTERVENTION:25122}  Patient Goals/Self-Care Activities Patient will:  - {pharmacypatientgoals:24919}  Follow Up Plan: {CM FOLLOW UP XIDH:68616}

## 2021-08-29 ENCOUNTER — Telehealth: Payer: Self-pay | Admitting: Pharmacist

## 2021-08-29 NOTE — Chronic Care Management (AMB) (Signed)
Chronic Care Management Pharmacy Assistant   Name: Deanna Rivera  MRN: [id number] DOB: [birth year]  Reason for Encounter: Chart Review For Initial Visit With Clinical Pharmacist   Conditions to be addressed/monitored: CHF, Atrial Fibrillation, Hypothyroidism due to Hashimoto's thyroiditis, OA, Osteoporosis, HLD, Frequent falls, Vitamin D deficiency, Trouble sleeping  Primary concerns for visit include: Trouble sleeping per patient  Recent office visits:  05/17/2021 OV (PCP) [name], MD; no medication changes indicated.  04/19/2021 OV Allwardt, Alyssa M, PA-C; no medication changes indicated.  03/08/2021 OV (PCP) [name], MD; Please stop calcitonin spray.  Recent consult visits:  08/16/2021 OV Gertie Fey) Daryel November, MD; metamucil daily, Loperamide PRN  07/05/2021 OV (Cardiology) Thompson Grayer, MD; On pradaxa Given advanced age, I have advised that she switch to eliquis 2.5mg  BID She is willing to switch but wishes to wait until after she uses up her current pradaxa supply.  06/15/2021 OV (Endocrinology) Shamleffer, Melanie Crazier, MD; Pt declines DXA scan - had vertigo with the last one , no medication changes indicated.  04/04/2021 OV (Neurosurgery) Leisa Lenz A, DO; no medication changes indicated.  Hospital visits:  08/09/2021 ED visit for Fall -No medication changes indicated.  05/12/2021 ED visit for Fall -She does have evidence of a nasal bone fracture. -No medication changes indicated.   Medications: Outpatient Encounter Medications as of 08/29/2021  Medication Sig   acetaminophen (TYLENOL) 500 MG tablet Take 500 mg by mouth every 6 (six) hours as needed for mild pain or headache.   apixaban (ELIQUIS) 2.5 MG TABS tablet Take 1 tablet (2.5 mg total) by mouth 2 (two) times daily. (Patient not taking: Reported on 08/16/2021)   Cholecalciferol (VITAMIN D3) 25 MCG (1000 UT) CAPS Take 1 capsule by mouth daily.   digoxin (LANOXIN) 0.125 MG  tablet TAKE 1 TABLET BY MOUTH  DAILY   dorzolamide-timolol (COSOPT) 22.3-6.8 MG/ML ophthalmic solution Place 1 drop into both eyes 2 (two) times daily.   hydrochlorothiazide (HYDRODIURIL) 25 MG tablet TAKE 1 TABLET BY MOUTH  DAILY (Patient taking differently: Take 12.5 mg by mouth daily.)   latanoprost (XALATAN) 0.005 % ophthalmic solution Place 1 drop into both eyes at bedtime.   levothyroxine (SYNTHROID) 112 MCG tablet TAKE 1 TABLET BY MOUTH  DAILY (Patient taking differently: Take 112 mcg by mouth daily before breakfast.)   metoprolol succinate (TOPROL-XL) 50 MG 24 hr tablet TAKE 1 TABLET BY MOUTH  DAILY   predniSONE (DELTASONE) 1 MG tablet TAKE 1 TABLET BY MOUTH  DAILY   No facility-administered encounter medications on file as of 08/29/2021.   Current Medications:  Prednisone 1 mg last filled 06/09/2021 90 DS Vitamin D3 Eliquis 2.5 mg last filled 08/14/2021 30 DS Metoprolol Succinate 50 mg last filled 04/26/2021 90 DS Digoxin 0.125 mg last filled 07/26/2021 30 DS Acetaminophen 500 mg Dozolamide-Timolol 22.3-6.8 mg/mL last filled 06/05/2021 90 DS Latanoprost 0.005% ophthalmic solution last filled 06/09/2021 90 DS Levothyroxine 112 mcg last filled 07/18/2021 90 DS Hydrochlorothiazide 25 mg last filled 03/31/2021 90 DS  Patient Questions: Any changes in your medications or health? Patient states she is now taking Eliquis instead of Pradaxa.  Any side effects from any medications?  "Not so far."  Do you have any symptoms or problems not managed by your medications? Patient states she has troubles sleeping.  Any concerns about your health right now? "It's all related to sleep, [name] recommended Trazodone. Because I'm not sleeping I am falling."  Has your provider asked that you check  blood pressure, blood sugar, or follow special diet at home? Patient states she does check blood pressure, she does not check blood sugar. She follows a lot salt diet.  Do you get any type of  exercise on a regular basis? "When I get rest I do physical therapy."  Can you think of a goal you would like to reach for your health? "I would like to sleep."  Do you have any problems getting your medications? Patient states "they took away Pradaxa.They're not lowering prices they're just taking them away."  Is there anything that you would like to discuss during the appointment?  Patient states she would like to discuss her medications.  Please bring medications and supplements to appointment  Care Gaps: Medicare Annual Wellness: Completed 02/06/2021  Hemoglobin A1C: 5.8% on 07/28/2020 Colonoscopy: Aged out Dexa Scan: Discontinued Mammogram: Aged out  Future Appointments  Date Time Provider [place]  08/31/2021 11:30 AM Evans Lance, MD [facility]  09/05/2021 11:00 AM [facility] CCM PHARMACIST [facility]  09/27/2021  2:30 PM [name], MD [facility]  11/08/2021  8:15 AM [facility] DEVICE REMOTES [facility]  02/07/2022  8:15 AM [facility] DEVICE REMOTES [facility]   Star Rating Drugs:  April D Cal[place], [place] Pharmacist Assistant [phone]

## 2021-08-30 ENCOUNTER — Encounter: Payer: PRIVATE HEALTH INSURANCE | Admitting: Family Medicine

## 2021-08-31 ENCOUNTER — Ambulatory Visit (INDEPENDENT_AMBULATORY_CARE_PROVIDER_SITE_OTHER): Payer: Medicare Other | Admitting: Internal Medicine

## 2021-08-31 ENCOUNTER — Encounter: Payer: Self-pay | Admitting: Internal Medicine

## 2021-08-31 ENCOUNTER — Other Ambulatory Visit: Payer: Self-pay

## 2021-08-31 VITALS — BP 128/72 | HR 60 | Ht 64.0 in | Wt 132.0 lb

## 2021-08-31 DIAGNOSIS — I4819 Other persistent atrial fibrillation: Secondary | ICD-10-CM | POA: Diagnosis not present

## 2021-08-31 DIAGNOSIS — Z950 Presence of cardiac pacemaker: Secondary | ICD-10-CM

## 2021-08-31 DIAGNOSIS — I495 Sick sinus syndrome: Secondary | ICD-10-CM | POA: Diagnosis not present

## 2021-08-31 DIAGNOSIS — I442 Atrioventricular block, complete: Secondary | ICD-10-CM | POA: Diagnosis not present

## 2021-08-31 DIAGNOSIS — I340 Nonrheumatic mitral (valve) insufficiency: Secondary | ICD-10-CM

## 2021-08-31 DIAGNOSIS — I10 Essential (primary) hypertension: Secondary | ICD-10-CM

## 2021-08-31 NOTE — Progress Notes (Signed)
HPI Deanna Rivera is referred by Dr. Rayann Heman for ongoing evaluation and management of her atrial fib, heart block, s/p PPM insertion, chronic edema, and PPM. She has a multitude of complaints today, with leg swelling first on the list. She has taken HCTZ but usually only takes a 1/2 tablet. She has a h/o MR which has been mild/moderate. She denies palpitations.  Allergies  Allergen Reactions   Shellfish-Derived Products Shortness Of Breath and Other (See Comments)    Lips go numb   Epinephrine Other (See Comments) and Hypertension    "Makes me feel wired"   Monosodium Glutamate Other (See Comments)    other   Ciprofloxacin Other (See Comments)    Allergic reaction not recalled   Erythromycin Other (See Comments)    Allergic reaction not recalled   Monosodium Glutamate Other (See Comments) and Hypertension    "Makes me feel badly"   Sulfa Antibiotics Other (See Comments)    Allergic reaction not recalled   Sulfamethoxazole-Trimethoprim    Tetracyclines & Related Other (See Comments)    Allergic reaction not recalled     Current Outpatient Medications  Medication Sig Dispense Refill   acetaminophen (TYLENOL) 500 MG tablet Take 500 mg by mouth every 6 (six) hours as needed for mild pain or headache.     apixaban (ELIQUIS) 2.5 MG TABS tablet Take 1 tablet (2.5 mg total) by mouth 2 (two) times daily. 180 tablet 3   Cholecalciferol (VITAMIN D3) 25 MCG (1000 UT) CAPS Take 1 capsule by mouth daily.     digoxin (LANOXIN) 0.125 MG tablet TAKE 1 TABLET BY MOUTH  DAILY 90 tablet 3   dorzolamide-timolol (COSOPT) 22.3-6.8 MG/ML ophthalmic solution Place 1 drop into both eyes 2 (two) times daily.     hydrochlorothiazide (HYDRODIURIL) 25 MG tablet TAKE 1 TABLET BY MOUTH  DAILY (Patient taking differently: Take 12.5 mg by mouth daily.) 90 tablet 3   latanoprost (XALATAN) 0.005 % ophthalmic solution Place 1 drop into both eyes at bedtime.     levothyroxine (SYNTHROID) 112 MCG tablet TAKE 1  TABLET BY MOUTH  DAILY (Patient taking differently: Take 112 mcg by mouth daily before breakfast.) 90 tablet 3   metoprolol succinate (TOPROL-XL) 50 MG 24 hr tablet TAKE 1 TABLET BY MOUTH  DAILY 90 tablet 3   predniSONE (DELTASONE) 1 MG tablet TAKE 1 TABLET BY MOUTH  DAILY 90 tablet 3   No current facility-administered medications for this visit.     Past Medical History:  Diagnosis Date   Cataract    Hypertension    Moderate aortic stenosis    Moderate mitral regurgitation    Persistent atrial fibrillation (HCC)    PMR (polymyalgia rheumatica) (HCC)    Second degree AV block    Sick sinus syndrome (HCC)    Thyroid disease     ROS:   All systems reviewed and negative except as noted in the HPI.   Past Surgical History:  Procedure Laterality Date   ABDOMINAL HYSTERECTOMY     APPENDECTOMY     CESAREAN SECTION     x 3   CHOLECYSTECTOMY     PACEMAKER GENERATOR CHANGE  11/20/2018   [employer] Accolade MRI PPM implanted in [place] CYST EXCISION       Family History  Problem Relation Age of Onset   Early death Mother 61   Hypertension Brother    Cirrhosis Father 52   Lung  cancer Brother 12   Scleroderma Daughter      Social History   Socioeconomic History   Marital status: Married    Spouse name: Not on file   Number of children: Not on file   Years of education: Not on file   Highest education level: Not on file  Occupational History   Occupation: Retired     Comment: Oncologist   Tobacco Use   Smoking status: Former    Types: Cigarettes   Smokeless tobacco: Never  Substance and Sexual Activity   Alcohol use: Not Currently   Drug use: Not Currently   Sexual activity: Not Currently    Birth control/protection: Post-menopausal  Other Topics Concern   Not on file  Social History Narrative   ** Merged History Encounter **       Recently moved from [place]. In independent living with her husband.  Daughter died from scleroderma complications. Hx of falls, but last was > 1.5 years ago. 01/18/2019    Social Determinants of Health   Financial Resource Strain: Not on file  Food Insecurity: Not on file  Transportation Needs: Not on file  Physical Activity: Not on file  Stress: Not on file  Social Connections: Not on file  Intimate Partner Violence: Not on file     BP 128/72    Pulse 60    Ht 5\' 4"  (1.626 m)    Wt 132 lb (59.9 kg)    SpO2 98%    BMI 22.66 kg/m   Physical Exam:  Well appearing elderly woman, NAD HEENT: Unremarkable Neck:  No JVD, no thyromegally Lymphatics:  No adenopathy Back:  No CVA tenderness Lungs:  Clear with no wheezes HEART:  IRegular rate rhythm, 2/6 murmurs, no rubs, no clicks Abd:  soft, positive bowel sounds, no organomegally, no rebound, no guarding Ext:  2 plus pulses, no edema, no cyanosis, no clubbing Skin:  No rashes no nodules Neuro:  CN II through XII intact, motor grossly intact  DEVICE  Normal device function.  See PaceArt for details.   Assess/Plan:  Perm Atrial fib - her rates are controlled. We will follow. Sinus node dysfunction - she has now reverted to atrial fib.  PPM - her [place] [facility] DDD PM is programmed VVI.  MR - she will undergo 2D echo in 6 months.   Deanna Overlie [name],MD

## 2021-08-31 NOTE — Patient Instructions (Addendum)
Medication Instructions:  Your physician recommends that you continue on your current medications as directed. Please refer to the Current Medication list given to you today.  Labwork: None ordered.  Testing/Procedures: Your physician has requested that you have an echocardiogram. Echocardiography is a painless test that uses sound waves to create images of your heart. It provides your doctor with information about the size and shape of your heart and how well your hearts chambers and valves are working. This procedure takes approximately one hour. There are no restrictions for this procedure.  Please schedule ECHO in June 2023  Follow-Up: Your physician wants you to follow-up in: one year with Cristopher Peru, MD or one of the following Advanced Practice Providers on your designated Care Team:   Tommye Standard, [place] Legrand Como "[name]" Chalmers Cater, PA-C  Remote monitoring is used to monitor your Pacemaker from home. This monitoring reduces the number of office visits required to check your device to one time per year. It allows [place] to keep an eye on the functioning of your device to ensure it is working properly. You are scheduled for a device check from home on 11/08/2021. You may send your transmission at any time that day. If you have a wireless device, the transmission will be sent automatically. After your physician reviews your transmission, you will receive a postcard with your next transmission date.  Any Other Special Instructions Will Be Listed Below (If Applicable).  If you need a refill on your cardiac medications before your next appointment, please call your pharmacy.

## 2021-09-04 ENCOUNTER — Emergency Department (HOSPITAL_COMMUNITY): Payer: Medicare Other

## 2021-09-04 ENCOUNTER — Emergency Department (HOSPITAL_COMMUNITY)
Admission: EM | Admit: 2021-09-04 | Discharge: 2021-09-04 | Disposition: A | Discharge status: Home / Self Care | Payer: Medicare Other | Attending: Emergency Medicine | Admitting: Emergency Medicine

## 2021-09-04 ENCOUNTER — Encounter (HOSPITAL_COMMUNITY): Payer: Self-pay | Admitting: Emergency Medicine

## 2021-09-04 ENCOUNTER — Other Ambulatory Visit: Payer: Self-pay

## 2021-09-04 DIAGNOSIS — Z7901 Long term (current) use of anticoagulants: Secondary | ICD-10-CM | POA: Insufficient documentation

## 2021-09-04 DIAGNOSIS — W228XXA Striking against or struck by other objects, initial encounter: Secondary | ICD-10-CM | POA: Diagnosis not present

## 2021-09-04 DIAGNOSIS — Z20822 Contact with and (suspected) exposure to covid-19: Secondary | ICD-10-CM | POA: Diagnosis not present

## 2021-09-04 DIAGNOSIS — S80212A Abrasion, left knee, initial encounter: Secondary | ICD-10-CM | POA: Diagnosis not present

## 2021-09-04 DIAGNOSIS — Z87891 Personal history of nicotine dependence: Secondary | ICD-10-CM | POA: Diagnosis not present

## 2021-09-04 DIAGNOSIS — S0990XA Unspecified injury of head, initial encounter: Secondary | ICD-10-CM | POA: Insufficient documentation

## 2021-09-04 DIAGNOSIS — Z23 Encounter for immunization: Secondary | ICD-10-CM | POA: Diagnosis not present

## 2021-09-04 DIAGNOSIS — I10 Essential (primary) hypertension: Secondary | ICD-10-CM | POA: Diagnosis not present

## 2021-09-04 DIAGNOSIS — I4891 Unspecified atrial fibrillation: Secondary | ICD-10-CM | POA: Insufficient documentation

## 2021-09-04 DIAGNOSIS — W19XXXA Unspecified fall, initial encounter: Secondary | ICD-10-CM

## 2021-09-04 DIAGNOSIS — Z950 Presence of cardiac pacemaker: Secondary | ICD-10-CM | POA: Diagnosis not present

## 2021-09-04 LAB — I-STAT CHEM 8, ED
BUN: 24 mg/dL — ABNORMAL HIGH (ref 8–23)
Calcium, Ion: 1.28 mmol/L (ref 1.15–1.40)
Chloride: 96 mmol/L — ABNORMAL LOW (ref 98–111)
Creatinine, Ser: 0.3 mg/dL — ABNORMAL LOW (ref 0.44–1.00)
Glucose, Bld: 109 mg/dL — ABNORMAL HIGH (ref 70–99)
HCT: 45 % (ref 36.0–46.0)
Hemoglobin: 15.3 g/dL — ABNORMAL HIGH (ref 12.0–15.0)
Potassium: 3.8 mmol/L (ref 3.5–5.1)
Sodium: 134 mmol/L — ABNORMAL LOW (ref 135–145)
TCO2: 28 mmol/L (ref 22–32)

## 2021-09-04 LAB — RESP PANEL BY RT-PCR (FLU A&B, COVID) ARPGX2
Influenza A by PCR: NEGATIVE
Influenza B by PCR: NEGATIVE
SARS Coronavirus 2 by RT PCR: NEGATIVE

## 2021-09-04 LAB — COMPREHENSIVE METABOLIC PANEL
ALT: 31 U/L (ref 0–44)
AST: 41 U/L (ref 15–41)
Albumin: 3.9 g/dL (ref 3.5–5.0)
Alkaline Phosphatase: 57 U/L (ref 38–126)
Anion gap: 9 (ref 5–15)
BUN: 20 mg/dL (ref 8–23)
CO2: 27 mmol/L (ref 22–32)
Calcium: 10.3 mg/dL (ref 8.9–10.3)
Chloride: 96 mmol/L — ABNORMAL LOW (ref 98–111)
Creatinine, Ser: 0.36 mg/dL — ABNORMAL LOW (ref 0.44–1.00)
GFR, Estimated: >60 mL/min (ref >60)
Glucose, Bld: 107 mg/dL — ABNORMAL HIGH (ref 70–99)
Potassium: 3.9 mmol/L (ref 3.5–5.1)
Sodium: 132 mmol/L — ABNORMAL LOW (ref 135–145)
Total Bilirubin: 0.6 mg/dL (ref 0.3–1.2)
Total Protein: 8 g/dL (ref 6.5–8.1)

## 2021-09-04 LAB — CBC
HCT: 42.9 % (ref 36.0–46.0)
Hemoglobin: 14.6 g/dL (ref 12.0–15.0)
MCH: 32.7 pg (ref 26.0–34.0)
MCHC: 34 g/dL (ref 30.0–36.0)
MCV: 96.2 fL (ref 80.0–100.0)
Platelets: 206 10*3/uL (ref 150–400)
RBC: 4.46 MIL/uL (ref 3.87–5.11)
RDW: 12.9 % (ref 11.5–15.5)
WBC: 5.8 10*3/uL (ref 4.0–10.5)
nRBC: 0 % (ref 0.0–0.2)

## 2021-09-04 LAB — SAMPLE TO BLOOD BANK

## 2021-09-04 LAB — PROTIME-INR
INR: 1.1 (ref 0.8–1.2)
Prothrombin Time: 14.5 seconds (ref 11.4–15.2)

## 2021-09-04 LAB — LACTIC ACID, PLASMA: Lactic Acid, Venous: 1.3 mmol/L (ref 0.5–1.9)

## 2021-09-04 LAB — ETHANOL: Alcohol, Ethyl (B): <10 mg/dL (ref <10)

## 2021-09-04 MED ORDER — TETANUS-DIPHTH-ACELL PERTUSSIS 5-2.5-18.5 LF-MCG/0.5 IM SUSY
0.5000 mL | PREFILLED_SYRINGE | Freq: Once | INTRAMUSCULAR | Status: AC
  Administered 2021-09-04: 0.5 mL via INTRAMUSCULAR
  Filled 2021-09-04: qty 0.5

## 2021-09-04 MED ORDER — ACETAMINOPHEN 325 MG PO TABS
650.0000 mg | ORAL_TABLET | Freq: Once | ORAL | Status: AC
  Administered 2021-09-04: 650 mg via ORAL
  Filled 2021-09-04: qty 2

## 2021-09-04 NOTE — ED Notes (Signed)
Patient discharge instructions reviewed with the patient. The patient verbalized understanding of instructions. Patient discharged. 

## 2021-09-04 NOTE — Discharge Instructions (Addendum)
-   you were evaluated in our ED for Falls - Your Head and Neck scans did not show signs of injury. - Xrays did not show signs of injury

## 2021-09-04 NOTE — ED Notes (Signed)
Patient transported to CT 

## 2021-09-04 NOTE — ED Triage Notes (Signed)
Patient coming from home, complaint of mechanical fall. Patient is on Eliquis. Pt fell striking her head. No LOC. Hematoma above right eye upon arrival to department. VSS. NAD.

## 2021-09-04 NOTE — ED Provider Notes (Signed)
[facility] Emergency Department Provider Note MRN:  [id number]  Arrival date & time: 09/04/21     Chief Complaint   Fall on thinners   History of Present Illness   Deanna Rivera is a 88 y.o. year-old female with a history of atrial fibrillation on Eliquis, thyroid disease, sick sinus syndrome, PMR, aortic stenosis, hypertension presenting to the ED with chief complaint of mechanical fall.  The patient was reportedly using her walker when she suffered a mechanical fall at her facility where they are undergoing construction.  The patient states that she was attempting to ambulate and when she fell she struck her head.  The patient denies that she has had loss of consciousness.  Patient reports that she has pain to her left orbit.  The patient denies that she has difficulty seeing or pain with extraocular movements.  Of note the patient is on Eliquis taking her last dose earlier this morning.  Patient also reports that she has pain to her left knee.  Patient arrives in c-collar.  Handoff received from EMS who reports that the patient was hemodynamically stable throughout transit.  They do report that the patient was hypertensive with systolic blood pressure of 170.  Review of Systems  A thorough review of systems was obtained and all systems are negative except as noted in the HPI and PMH.   Patient's Health History    Past Medical History:  Diagnosis Date   Cataract    Hypertension    Moderate aortic stenosis    Moderate mitral regurgitation    Persistent atrial fibrillation (HCC)    PMR (polymyalgia rheumatica) (HCC)    Second degree AV block    Sick sinus syndrome (HCC)    Thyroid disease     Past Surgical History:  Procedure Laterality Date   ABDOMINAL HYSTERECTOMY     APPENDECTOMY     CESAREAN SECTION     x 3   CHOLECYSTECTOMY     PACEMAKER GENERATOR CHANGE  11/20/2018   [employer] Accolade MRI PPM implanted in [place] CYST EXCISION      Family History  Problem Relation Age of Onset   Early death Mother 15   Hypertension Brother    Cirrhosis Father 24   Lung cancer Brother 28   Scleroderma Daughter     Social History   Socioeconomic History   Marital status: Married    Spouse name: Not on file   Number of children: Not on file   Years of education: Not on file   Highest education level: Not on file  Occupational History   Occupation: Retired     Comment: Oncologist   Tobacco Use   Smoking status: Former    Types: Cigarettes   Smokeless tobacco: Never  Substance and Sexual Activity   Alcohol use: Not Currently   Drug use: Not Currently   Sexual activity: Not Currently    Birth control/protection: Post-menopausal  Other Topics Concern   Not on file  Social History Narrative   ** Merged History Encounter **       Recently moved from [place]. In independent living with her husband. Daughter died from scleroderma complications. Hx of falls, but last was > 1.5 years ago. 01/18/2019    Social Determinants of Health   Financial Resource Strain: Not on file  Food Insecurity: Not on file  Transportation Needs: Not on file  Physical Activity: Not on file  Stress: Not on file  Social Connections: Not on file  Intimate Partner Violence: Not on file     Physical Exam   Vitals:   09/04/21 1947 09/04/21 2122  BP: (!) 172/100 (!) 195/101  Pulse: 76 61  Resp: 18 20  Temp:  97.6 F (36.4 C)  SpO2: 94% 99%    Physical Exam Constitutional:      Appearance: Normal appearance. She is normal weight.     Comments: Wearing cervical collar  HENT:     Head: Normocephalic.     Right Ear: External ear normal.     Left Ear: External ear normal.     Nose: Nose normal.     Mouth/Throat:     Mouth: Mucous membranes are dry.  Eyes:     Extraocular Movements: Extraocular movements intact.     Pupils: Pupils are equal, round, and reactive to light.   Cardiovascular:     Rate and Rhythm: Normal rate. Rhythm irregular.     Pulses: Normal pulses.     Heart sounds: Normal heart sounds.  Pulmonary:     Effort: Pulmonary effort is normal.     Breath sounds: Normal breath sounds.  Chest:     Chest wall: No tenderness.  Abdominal:     General: Abdomen is flat. Bowel sounds are normal. There is no distension.     Palpations: Abdomen is soft.     Tenderness: There is no abdominal tenderness.  Musculoskeletal:        General: Tenderness present. No swelling or deformity.     Right lower leg: No edema.     Left lower leg: No edema.     Comments: Mild tenderness to the left knee.  No tenderness to manipulation of the pelvis, chest wall, or bony prominences throughout other extremities.  Skin:    Findings: Erythema (periorbital) and lesion (left knee skin tear) present.  Neurological:     General: No focal deficit present.     Mental Status: She is alert and oriented to person, place, and time. Mental status is at baseline.     Cranial Nerves: No cranial nerve deficit.     Sensory: No sensory deficit.     Motor: No weakness.     Gait: Gait normal.     Diagnostic and Interventional Summary    Labs Reviewed  COMPREHENSIVE METABOLIC PANEL - Abnormal; Notable for the following components:      Result Value   Sodium 132 (*)    Chloride 96 (*)    Glucose, Bld 107 (*)    Creatinine, Ser 0.36 (*)    All other components within normal limits  I-STAT CHEM 8, ED - Abnormal; Notable for the following components:   Sodium 134 (*)    Chloride 96 (*)    BUN 24 (*)    Creatinine, Ser 0.30 (*)    Glucose, Bld 109 (*)    Hemoglobin 15.3 (*)    All other components within normal limits  RESP PANEL BY RT-PCR (FLU A&B, COVID) ARPGX2  CBC  ETHANOL  LACTIC ACID, PLASMA  PROTIME-INR  SAMPLE TO BLOOD BANK    DG Knee Complete 4 Views Right  Final Result    DG Knee Complete 4 Views Left  Final Result    DG Chest 1 View  Final Result     DG Pelvis 1-2 Views  Final Result    CT HEAD WO CONTRAST  Final Result    CT CERVICAL SPINE WO CONTRAST  Final Result    CT MAXILLOFACIAL WO CONTRAST  Final Result      Medications  Tdap (BOOSTRIX) injection 0.5 mL (0.5 mLs Intramuscular Given 09/04/21 2050)  acetaminophen (TYLENOL) tablet 650 mg (650 mg Oral Given 09/04/21 2051)     Procedures  /  Critical Care Procedures  ED Course and Medical Decision Making  Initial Impression and Ddx 88 year old female presents as a level 2 trauma activation for fall.  Differential diagnosis includes but is not to the following: Acute fracture, dislocation, ligamentous injury, intracranial hemorrhage, skull fracture.  Will obtain trauma imaging to further evaluate.  Past medical/surgical history that increases complexity of ED encounter: Atrial fibrillation on Eliquis  Interpretation of Diagnostics I personally reviewed the Chest Xray and Cardiac Monitor and my interpretation is as follows: Irregularly irregular rhythm consistent with A. fib.  Chest x-ray negative for acute cardiopulmonary process    Imaging reviewed independently was negative for acute fracture of the facial bones, skull, or lower extremity.  No acute intracranial abnormality noted as well.  Labs were significant for mild electrolyte derangements however these were relatively unremarkable.  Patient Reassessment and Ultimate Disposition/Management Patient reassessed and was resting comfortably in bed.  I feel comfortable discharging the patient home to follow-up with her primary care physician.  Recommended warm compresses to the patient's orbits which were significantly bruised.  Patient management required discussion with the following services or consulting groups:  None  Complexity of Problems Addressed Acute illness or injury that poses threat of life of bodily function  Additional Data Reviewed and Analyzed Further history obtained from: EMS on arrival As per  above    Final Clinical Impressions(s) / ED Diagnoses     ICD-10-CM   1. Fall  W19.Merril Abbe DG Chest 1 View    DG Chest 1 View    DG Pelvis 1-2 Views    DG Pelvis 1-2 Views    CANCELED: DG Chest Port 1 View    CANCELED: DG Pelvis Portable    CANCELED: DG Chest Port 1 View    CANCELED: DG Pelvis Portable      ED Discharge Orders     None        Discharge Instructions Discussed with and Provided to Patient:     Discharge Instructions      - you were evaluated in our ED for Falls - Your Head and Neck scans did not show signs of injury. - Xrays did not show signs of injury        Zachery Dakins, MD 09/04/21 2348    Lorelle Gibbs, DO 09/05/21 2217

## 2021-09-04 NOTE — TOC CAGE-AID Note (Signed)
Transition of Care [facility]) - CAGE-AID Screening   Patient Details  Name: Deanna Rivera MRN: [id number] Date of Birth: 1933-04-28  Transition of [facility]) CM/SW Contact:    Army Melia, RN Phone Number:[phone] 09/04/2021, 8:14 PM   Clinical Narrative: Presents to ED via EMS after a fall with head injury, no LOC. Denies alcohol and drug use.    CAGE-AID Screening:    Have You Ever Felt You Ought to Cut Down on Your Drinking or Drug Use?: No Have People Annoyed You By Critizing Your Drinking Or Drug Use?: No Have You Felt Bad Or Guilty About Your Drinking Or Drug Use?: No Have You Ever Had a Drink or Used Drugs First Thing In The Morning to Steady Your Nerves or to Get Rid of a Hangover?: No CAGE-AID Score: 0  Substance Abuse Education Offered: No

## 2021-09-04 NOTE — ED Notes (Signed)
Trauma Response Nurse Documentation   Deanna Rivera is a 88 y.o. female arriving to [facility] ED via EMS  On Eliquis (apixaban) daily. Trauma was activated as a Level 2 by ED charge RN based on the following trauma criteria Elderly patients > 65 with head trauma on anti-coagulation (excluding ASA). Trauma team at the bedside on patient arrival. Patient cleared for CT by Dr. Dina Rich EDP. Patient to CT with team. GCS 15.  History   Past Medical History:  Diagnosis Date   Cataract    Hypertension    Moderate aortic stenosis    Moderate mitral regurgitation    Persistent atrial fibrillation (HCC)    PMR (polymyalgia rheumatica) (HCC)    Second degree AV block    Sick sinus syndrome ([place])    Thyroid disease      Past Surgical History:  Procedure Laterality Date   ABDOMINAL HYSTERECTOMY     APPENDECTOMY     CESAREAN SECTION     x 3   CHOLECYSTECTOMY     PACEMAKER GENERATOR CHANGE  11/20/2018   [employer] Accolade MRI PPM implanted in [place]         Initial Focused Assessment (If applicable, or please see trauma documentation): A/O x4 female arrives via EMS, no c-collar in place, hematoma to forehead, small abrasion near eye, abrasion left knee  CT's Completed:   CT Head, CT Maxillofacial, and CT C-Spine   Interventions:  Trauma lab draw COVID swab CTs as above XRAY chest, pelvis, bilateral knees CAGEAID TDAP  Plan for disposition:  Discharge home   Consults completed:  none   Event Summary: Patient arrives via EMS from her independent living facility [facility] after a mechanical fall while ambulating with her walker. Construction occurring at the facility, pt reports tripping on some carpet. Reports hitting bother her knees, c/o 10/10 pain. Abrasion to L knee, bleeding controlled. Hematoma to right forehead above brow, small abrasion. Denies LOC. Patient hypertensive in the field 210/90, initial MANUAL BP  172/100. AFIB on eliquis, rate 60-80. Patient reports nausea, declines zofran. Hx frequent falls, chronic back pain.  MTP Summary (If applicable): NA  Bedside handoff with ED RN Thurmond Butts.    Ivaan Liddy O Jocsan Mcginley  Trauma Response RN  Please call TRN at [phone] for further assistance.

## 2021-09-05 ENCOUNTER — Telehealth: Payer: Medicare Other

## 2021-09-05 NOTE — Progress Notes (Signed)
Orthopedic Tech Progress Note Patient Details:  Deanna Rivera Oct 05, 1932 [id number]  Patient ID: Deanna Rivera, female   DOB: 1933/04/16, 88 y.o.   MRN: [id number] I attended trauma page Deanna Rivera 09/05/2021, 2:48 AM

## 2021-09-05 NOTE — Progress Notes (Signed)
Chronic Care Management Pharmacy Note  08/25/2021 Name:  Deanna Rivera MRN:  [id number] DOB:  Aug 05, 1933  Summary: Initial visit with PharmD.  Still not sleeping but has not tried trazodone.  She also mentions severely elevated Bps sometimes in the 409 systolic.  She is taking extra metoprolol? To treat this.  Was previously on lisinopril.  Was also recommended she be on some treatment for osteoporosis by endocrine but she has not started this at this time.  Previously elevated LDL with Afib - not on statin.  Recommendations/Changes made from today's visit: Monitor BP - will FU in two weeks after PCP appt. - consider resume lisinopril if elevated Try the trazodone to see if this helps with sleep Consider osteoporosis treatment considering fall/fracture risk  Plan: FU 4 months   Subjective: Deanna Rivera is an 88 y.o. year old female who is a primary patient of [name], MD.  The CCM team was consulted for assistance with disease management and care coordination needs.    Engaged with patient by telephone for initial visit in response to provider referral for pharmacy case management and/or care coordination services.   Consent to Services:  The patient was given the following information about Chronic Care Management services today, agreed to services, and gave verbal consent: 1. CCM service includes personalized support from designated clinical staff supervised by the primary care provider, including individualized plan of care and coordination with other care providers 2. 24/7 contact phone numbers for assistance for urgent and routine care needs. 3. Service will only be billed when office clinical staff spend 20 minutes or more in a month to coordinate care. 4. Only one practitioner may furnish and bill the service in a calendar month. 5.The patient may stop CCM services at any time (effective at the end of the month) by phone call to the office staff. 6. The patient will be  responsible for cost sharing (co-pay) of up to 20% of the service fee (after annual deductible is met). Patient agreed to services and consent obtained.  Patient Care Team: [name], MD as PCP - General (Family Medicine) Thompson Grayer, MD as PCP - Electrophysiology (Cardiology) Thompson Grayer, MD as Consulting Physician (Cardiology) Georgia Lopes as Physician Assistant (Orthopedic Surgery) Hermelinda Medicus, MD as Consulting Physician (Rheumatology) [name], MD (Family Medicine) Edythe Clarity, [facility] as Pharmacist (Pharmacist)  Recent office visits:  05/17/2021 OV (PCP) [name], MD; no medication changes indicated.   04/19/2021 OV Allwardt, Alyssa M, PA-C; no medication changes indicated.   03/08/2021 OV (PCP) [name], MD; Please stop calcitonin spray.   Recent consult visits:  08/16/2021 OV Gertie Fey) Daryel November, MD; metamucil daily, Loperamide PRN   07/05/2021 OV (Cardiology) Thompson Grayer, MD; On pradaxa Given advanced age, I have advised that she switch to eliquis 2.67m BID She is willing to switch but wishes to wait until after she uses up her current pradaxa supply.   06/15/2021 OV (Endocrinology) Shamleffer, IMelanie Crazier MD; Pt declines DXA scan - had vertigo with the last one , no medication changes indicated.   04/04/2021 OV (Neurosurgery) MLeisa LenzA, DO; no medication changes indicated.   Hospital visits:  08/09/2021 ED visit for Fall -No medication changes indicated.   05/12/2021 ED visit for Fall -She does have evidence of a nasal bone fracture. -No medication changes indicated.     Objective:  Lab Results  Component Value Date   CREATININE 0.34 (L) 08/09/2021   BUN  20 08/09/2021   GFR 189.54 10/27/2019   GFRNONAA >60 08/09/2021   GFRAA 116 07/28/2020   NA 136 08/09/2021   K 3.7 08/09/2021   CALCIUM 10.2 08/09/2021   CO2 31 08/09/2021   GLUCOSE 104 (H) 08/09/2021    Lab Results  Component  Value Date/Time   HGBA1C 5.8 (H) 07/28/2020 11:31 AM   HGBA1C 5.8 10/27/2019 10:10 AM   GFR 189.54 10/27/2019 10:10 AM   GFR 177.35 03/19/2019 01:48 PM    Last diabetic Eye exam: No results found for: HMDIABEYEEXA  Last diabetic Foot exam: No results found for: HMDIABFOOTEX   Lab Results  Component Value Date   CHOL 202 (H) 07/28/2020   HDL 66 07/28/2020   LDLCALC 117 (H) 07/28/2020   TRIG 87 07/28/2020   CHOLHDL 3.1 07/28/2020    Hepatic Function Latest Ref Rng & Units 05/12/2021 01/21/2021 07/28/2020  Total Protein 6.5 - 8.1 g/dL 7.5 7.6 7.5  Albumin 3.5 - 5.0 g/dL 3.6 3.4(L) -  AST 15 - 41 U/L 65(H) 44(H) 34  ALT 0 - 44 U/L 43 30 29  Alk Phosphatase 38 - 126 U/L 54 52 -  Total Bilirubin 0.3 - 1.2 mg/dL 0.5 1.0 0.5  Bilirubin, Direct 0.0 - 0.2 mg/dL - 0.3(H) -    Lab Results  Component Value Date/Time   TSH 1.21 06/15/2021 11:34 AM   TSH 1.85 07/28/2020 11:31 AM   FREET4 1.46 03/19/2019 01:48 PM    CBC Latest Ref Rng & Units 08/09/2021 05/12/2021 05/12/2021  WBC 4.0 - 10.5 K/uL 7.2 6.3 -  Hemoglobin 12.0 - 15.0 g/dL 13.8 13.5 14.3  Hematocrit 36.0 - 46.0 % 42.1 41.5 42.0  Platelets 150 - 400 K/uL 224 227 -    Lab Results  Component Value Date/Time   VD25OH 25.86 (L) 06/15/2021 11:34 AM   VD25OH 34.95 10/27/2019 10:10 AM    Clinical ASCVD: No  The ASCVD Risk score (Arnett DK, et al., 2019) failed to calculate for the following reasons:   The 2019 ASCVD risk score is only valid for ages 64 to 19    Depression screen PHQ 2/9 03/08/2021 07/28/2020 11/12/2019  Decreased Interest 3 0 0  Down, Depressed, Hopeless 3 0 0  PHQ - 2 Score 6 0 0  Altered sleeping 0 3 -  Tired, decreased energy 2 3 -  Change in appetite 0 0 -  Feeling bad or failure about yourself  3 0 -  Trouble concentrating 0 0 -  Moving slowly or fidgety/restless 0 0 -  Suicidal thoughts 0 0 -  PHQ-9 Score 11 6 -  Difficult doing work/chores Very difficult - -     Social History   Tobacco Use   Smoking Status Former   Types: Cigarettes  Smokeless Tobacco Never   BP Readings from Last 3 Encounters:  08/16/21 140/72  08/09/21 (!) 165/76  07/05/21 134/70   Pulse Readings from Last 3 Encounters:  08/16/21 60  08/09/21 71  07/05/21 (!) 2   Wt Readings from Last 3 Encounters:  08/09/21 132 lb (59.9 kg)  07/05/21 131 lb (59.4 kg)  06/15/21 132 lb (59.9 kg)   BMI Readings from Last 3 Encounters:  08/09/21 23.38 kg/m  07/05/21 22.49 kg/m  06/15/21 22.66 kg/m    Assessment/Interventions: Review of patient past medical history, allergies, medications, health status, including review of consultants reports, laboratory and other test data, was performed as part of comprehensive evaluation and provision of chronic care management services.  SDOH:  (Social Determinants of Health) assessments and interventions performed: Yes  Financial Resource Strain: Low Risk    Difficulty of Paying Living Expenses: Not very hard   Food Insecurity: No Food Insecurity   Worried About Charity fundraiser in the Last Year: Never true   Ran Out of Food in the Last Year: Never true    SDOH Screenings   Alcohol Screen: Not on file  Depression (PHQ2-9): Medium Risk   PHQ-2 Score: 11  Financial Resource Strain: Not on file  Food Insecurity: Not on file  Housing: Not on file  Physical Activity: Not on file  Social Connections: Not on file  Stress: Not on file  Tobacco Use: Medium Risk   Smoking Tobacco Use: Former   Smokeless Tobacco Use: Never   Passive Exposure: Not on file  Transportation Needs: Not on file    Kinde  Allergies  Allergen Reactions   Shellfish-Derived Products Shortness Of Breath and Other (See Comments)    Lips go numb   Epinephrine Other (See Comments) and Hypertension    "Makes me feel wired"   Monosodium Glutamate Other (See Comments)    other   Ciprofloxacin Other (See Comments)    Allergic reaction not recalled   Erythromycin Other (See  Comments)    Allergic reaction not recalled   Monosodium Glutamate Other (See Comments) and Hypertension    "Makes me feel badly"   Sulfa Antibiotics Other (See Comments)    Allergic reaction not recalled   Sulfamethoxazole-Trimethoprim    Tetracyclines & Related Other (See Comments)    Allergic reaction not recalled    Medications Reviewed Today     Reviewed by Daryel November, MD (Physician) on 08/21/21 at [phone]  Med List Status: <None>   Medication Order Taking? Sig Documenting Provider Last Dose Status Informant  acetaminophen (TYLENOL) 500 MG tablet [id number] Yes Take 500 mg by mouth every 6 (six) hours as needed for mild pain or headache. [provider] Taking Active Self  apixaban (ELIQUIS) 2.5 MG TABS tablet [id number] No Take 1 tablet (2.5 mg total) by mouth 2 (two) times daily.  Patient not taking: Reported on 08/16/2021   Thompson Grayer, MD Not Taking Active   Cholecalciferol (VITAMIN D3) 25 MCG (1000 UT) CAPS [id number] Yes Take 1 capsule by mouth daily. [provider] Taking Active   digoxin (LANOXIN) 0.125 MG tablet [id number] Yes TAKE 1 TABLET BY MOUTH  DAILY Allred, James, MD Taking Active   dorzolamide-timolol (COSOPT) 22.3-6.8 MG/ML ophthalmic solution [id number] Yes Place 1 drop into both eyes 2 (two) times daily. [provider] Taking Active Self  hydrochlorothiazide (HYDRODIURIL) 25 MG tablet [id number] Yes TAKE 1 TABLET BY MOUTH  DAILY  Patient taking differently: Take 12.5 mg by mouth daily.   [name], MD Taking Active   latanoprost (XALATAN) 0.005 % ophthalmic solution [id number] Yes Place 1 drop into both eyes at bedtime. [provider] Taking Active Self  levothyroxine (SYNTHROID) 112 MCG tablet [id number] Yes TAKE 1 TABLET BY MOUTH  DAILY  Patient taking differently: Take 112 mcg by mouth daily before breakfast.   [name], MD Taking Active   metoprolol succinate (TOPROL-XL) 50 MG 24 hr tablet [id number] Yes TAKE  1 TABLET BY MOUTH  DAILY [name], MD Taking Active   predniSONE (DELTASONE) 1 MG tablet [id number]  TAKE 1 TABLET BY MOUTH  DAILY [name], MD  Active  Patient Active Problem List   Diagnosis Date Noted   Hashimoto's thyroiditis 06/15/2021   History of vitamin D deficiency 06/15/2021   Frequent falls 05/17/2021   Age-related osteoporosis with current pathological fracture 02/06/2021   Compression fracture of body of thoracic vertebra ([place]) 02/06/2021   Primary osteoarthritis of right knee 10/23/2019   Neuralgia, postherpetic 07/02/2019   OAB (overactive bladder) 07/02/2019   SSS (sick sinus syndrome) ([place]) 02/11/2019   Constipation 01/18/2019   Status post placement of cardiac pacemaker 07/08/2018   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   Shellfish allergy 11/22/2015   Nonrheumatic mitral valve regurgitation 09/16/2015   Nontoxic multinodular goiter 09/08/2015   Current use of long term anticoagulation 05/16/2015   Recurrent UTI 03/14/2015   (HFpEF) heart failure with preserved ejection fraction ([place]) 09/13/2014   Pacemaker 11/09/2011   Impaired fasting glucose 05/23/2009   Synovial cyst of popliteal space 11/22/2008   Long term current use of systemic steroids 03/30/2008   Polymyalgia rheumatica ([place]) 03/30/2008   Benign essential hypertension 05/05/2007   Persistent atrial fibrillation ([place]) 11/06/2006   Pure hypercholesterolemia 11/06/2006   Hypothyroidism due to Hashimoto's thyroiditis, Rx Levothyroxine 06/25/2005    Immunization History  Administered Date(s) Administered   Fluad Quad(high Dose 65+) 05/13/2020   Moderna Sars-Covid-2 Vaccination 08/12/2019, 09/09/2019, 06/14/2020   Pneumococcal Conjugate-13 04/28/2019   Tdap 05/12/2021    Conditions to be addressed/monitored:  HFpEF, HTN, Afib, Hypothyroidism, HLD, Osteoporosis  Patient Care Plan: General Pharmacy (Adult)     Problem Identified: HFpEF, HTN, Afib, Hypothyroidism, HLD,  Osteoporosis   Priority: High  Onset Date: 09/12/2021     Long-Range Goal: Patient-Specific Goal   Start Date: 09/12/2021  Expected End Date: 03/12/2022  This Visit's Progress: On track  Priority: High  Note:   Current Barriers:  Unable to maintain control of BP Does not adhere to prescribed medication regimen  Pharmacist Clinical Goal(s):  Patient will achieve improvement in BP as evidenced by monitoring adhere to prescribed medication regimen as evidenced by fill dates through collaboration with PharmD and provider.   Interventions: 1:1 collaboration with [name], MD regarding development and update of comprehensive plan of care as evidenced by provider attestation and co-signature Inter-disciplinary care team collaboration (see longitudinal plan of care) Comprehensive medication review performed; medication list updated in electronic medical record  Hypertension (BP goal <130/80) -Not ideally controlled -Current treatment: HCTZ 75m daily one half tablet daily Appropriate, Query effective -Medications previously tried: lisinopril?  -Current home readings: no logs today - does report occasional 2250Nsystolic -Denies hypotensive/hypertensive symptoms -Educated on BP goals and benefits of medications for prevention of heart attack, stroke and kidney damage; Exercise goal of 150 minutes per week; Importance of home blood pressure monitoring; Symptoms of hypotension and importance of maintaining adequate hydration; -Counseled to monitor BP at home daily x 2 weeks, document, and provide log at future appointments -Recommended to continue current medication Increase monitoring, she may need to reinitiate lisinopril if BP is really getting as high as she says it is.  Hyperlipidemia: (LDL goal < 70) -Uncontrolled -Current treatment: None -Medications previously tried: none noted  -Educated on Cholesterol goals;  Benefits of statin for ASCVD risk reduction; Importance of  limiting foods high in cholesterol; -Recheck lipids at upcoming OV, Afib increased risk of stroke. LDL back in 2021 was elevated. Consider patient preference and upcoming labs to determine if statin is necessary.  Atrial Fibrillation (Goal: prevent stroke and major bleeding) -Controlled -Current treatment: Rate control: Metoprolol, XL 581mdaily  Appropriate, Effective, Safe, Accessible Digosin 0.132m daily Appropriate, Effective, Safe, Accessible Anticoagulation: Eliquis 2.5 mg BID Appropriate, Effective, Safe, Accessible -Counseled on increased risk of stroke due to Afib and benefits of anticoagulation for stroke prevention; importance of adherence to anticoagulant exactly as prescribed; -Recommended to continue current medication She mentions sometimes she takes extra Toprolol when BP is high. Counseled on effect on HR and increasing fall risk. Check BP at upcoming OV - may need additional BP control to avoid extra doses of other meds.  Heart Failure (Goal: manage symptoms and prevent exacerbations) -Controlled -Last ejection fraction: 55% -Current treatment: Metoprolol XL 585mdaily Appropriate, Effective, Safe, Accessible -Medications previously tried: none noted  -Current home BP/HR readings: no logs today -Educated on Benefits of medications for managing symptoms and prolonging life Importance of weighing daily; if you gain more than 3 pounds in one day or 5 pounds in one week, contact providers Importance of blood pressure control -Recommended to continue current medication She does mention some slight swelling in feet and ankles, knows to contact usKoreaf it gets worse.  Osteoporosis (Goal Prevent falls/fractures) -Not ideally controlled -Last DEXA Scan: unknown - patient refused previously  -Patient is a candidate for pharmacologic treatment due to history of vertebral fracture -Current treatment  None -Medications previously tried: none  -Recommend [id number] units of  vitamin D daily. Recommend 1200 mg of calcium daily from dietary and supplemental sources. Recommend weight-bearing and muscle strengthening exercises for building and maintaining bone density. She was suggested by endo to take either Prolia, fosamax at last visit and was supposed to contact office.  She is a very high fall/fracture risk so something like this may be beneficial to her. -Recommend treat ment for osteoporosis pending patient preference.  Hypothyroidism (Goal: Maintain TSH) -Controlled -Current treatment  Levothyroxine 11261mdaily Appropriate, Effective, Safe, Accessible -Medications previously tried: none noted -Takes appropriately  -Recommended to continue current medication  Patient Goals/Self-Care Activities Patient will:  - take medications as prescribed as evidenced by patient report and record review check blood pressure daily, document, and provide at future appointments  Follow Up Plan: The care management team will reach out to the patient again over the next 30 days.         Medication Assistance: None required.  Patient affirms current coverage meets needs.  Compliance/Adherence/Medication fill history: Care Gaps: DEXA - fu with PCP in two weeks Pneumonia vaccine  Star-Rating Drugs: N/A  Patient's preferred pharmacy is:  [facility] [id number] [place]one: [phone]: [phone] ([address]one: [phone]: [phone] Centerlivery ([facility] ) - [address] [place] [id number]: [phone]: [phone] pill box? Yes Pt endorses 100% compliance  We discussed: Benefits of medication synchronization, packaging and delivery as well  as enhanced pharmacist oversight with [facility]. Patient decided to: Continue current medication management strategy  Care Plan and Follow Up Patient Decision:  Patient agrees to Care Plan and Follow-up.  Plan: The care management team will reach out to the patient again over the next 30 days.  ChrBeverly MilchharmD Clinical Pharmacist  [phone]

## 2021-09-06 ENCOUNTER — Telehealth: Payer: Self-pay | Admitting: Internal Medicine

## 2021-09-06 NOTE — Telephone Encounter (Signed)
Can order for Echo please be extended past 01/11/22. Patient wants to have it done later in June. Thank you.

## 2021-09-12 ENCOUNTER — Ambulatory Visit (INDEPENDENT_AMBULATORY_CARE_PROVIDER_SITE_OTHER): Payer: Medicare Other | Admitting: Pharmacist

## 2021-09-12 DIAGNOSIS — I10 Essential (primary) hypertension: Secondary | ICD-10-CM

## 2021-09-12 DIAGNOSIS — I110 Hypertensive heart disease with heart failure: Secondary | ICD-10-CM

## 2021-09-12 DIAGNOSIS — I4891 Unspecified atrial fibrillation: Secondary | ICD-10-CM

## 2021-09-12 DIAGNOSIS — E038 Other specified hypothyroidism: Secondary | ICD-10-CM

## 2021-09-12 DIAGNOSIS — I509 Heart failure, unspecified: Secondary | ICD-10-CM

## 2021-09-12 DIAGNOSIS — I4819 Other persistent atrial fibrillation: Secondary | ICD-10-CM

## 2021-09-12 DIAGNOSIS — E785 Hyperlipidemia, unspecified: Secondary | ICD-10-CM

## 2021-09-12 DIAGNOSIS — E063 Autoimmune thyroiditis: Secondary | ICD-10-CM

## 2021-09-12 DIAGNOSIS — E7800 Pure hypercholesterolemia, unspecified: Secondary | ICD-10-CM

## 2021-09-12 DIAGNOSIS — M818 Other osteoporosis without current pathological fracture: Secondary | ICD-10-CM

## 2021-09-12 DIAGNOSIS — E039 Hypothyroidism, unspecified: Secondary | ICD-10-CM

## 2021-09-12 DIAGNOSIS — I5032 Chronic diastolic (congestive) heart failure: Secondary | ICD-10-CM

## 2021-09-12 NOTE — Patient Instructions (Addendum)
Visit Information   Goals Addressed             This Visit's Progress    Prevent Falls and Broken Bones-Osteoporosis       Timeframe:  Long-Range Goal Priority:  High Start Date:  09/12/21                           Expected End Date: 03/12/22                       Follow Up Date 04/31/23    - always wear shoes or slippers with non-slip sole - keep cell phone with me always - make an emergency alert plan in case I fall - pick up clutter from the floors - remove, or use a non-slip pad, with my throw rugs    Why is this important?   When you fall, there are 3 things that control if a bone breaks or not.  These are the fall itself, how hard and the direction that you fall and how fragile your bones are.  Preventing falls is very important for you because of fragile bones.     Notes:        Patient Care Plan: General Pharmacy (Adult)     Problem Identified: HFpEF, HTN, Afib, Hypothyroidism, HLD, Osteoporosis   Priority: High  Onset Date: 09/12/2021     Long-Range Goal: Patient-Specific Goal   Start Date: 09/12/2021  Expected End Date: 03/12/2022  This Visit's Progress: On track  Priority: High  Note:   Current Barriers:  Unable to maintain control of BP Does not adhere to prescribed medication regimen  Pharmacist Clinical Goal(s):  Patient will achieve improvement in BP as evidenced by monitoring adhere to prescribed medication regimen as evidenced by fill dates through collaboration with PharmD and provider.   Interventions: 1:1 collaboration with [name], MD regarding development and update of comprehensive plan of care as evidenced by provider attestation and co-signature Inter-disciplinary care team collaboration (see longitudinal plan of care) Comprehensive medication review performed; medication list updated in electronic medical record  Hypertension (BP goal <130/80) -Not ideally controlled -Current treatment: HCTZ 25mg  daily one half tablet daily  Appropriate, Query effective -Medications previously tried: lisinopril?  -Current home readings: no logs today - does report occasional 893Y systolic -Denies hypotensive/hypertensive symptoms -Educated on BP goals and benefits of medications for prevention of heart attack, stroke and kidney damage; Exercise goal of 150 minutes per week; Importance of home blood pressure monitoring; Symptoms of hypotension and importance of maintaining adequate hydration; -Counseled to monitor BP at home daily x 2 weeks, document, and provide log at future appointments -Recommended to continue current medication Increase monitoring, she may need to reinitiate lisinopril if BP is really getting as high as she says it is.  Hyperlipidemia: (LDL goal < 70) -Uncontrolled -Current treatment: None -Medications previously tried: none noted  -Educated on Cholesterol goals;  Benefits of statin for ASCVD risk reduction; Importance of limiting foods high in cholesterol; -Recheck lipids at upcoming OV, Afib increased risk of stroke. LDL back in 2021 was elevated. Consider patient preference and upcoming labs to determine if statin is necessary.  Atrial Fibrillation (Goal: prevent stroke and major bleeding) -Controlled -Current treatment: Rate control: Metoprolol, XL 50mg  daily Appropriate, Effective, Safe, Accessible Digosin 0.125mg  daily Appropriate, Effective, Safe, Accessible Anticoagulation: Eliquis 2.5 mg BID Appropriate, Effective, Safe, Accessible -Counseled on increased risk of stroke due to Afib and  benefits of anticoagulation for stroke prevention; importance of adherence to anticoagulant exactly as prescribed; -Recommended to continue current medication She mentions sometimes she takes extra Toprolol when BP is high. Counseled on effect on HR and increasing fall risk. Check BP at upcoming OV - may need additional BP control to avoid extra doses of other meds.  Heart Failure (Goal: manage symptoms  and prevent exacerbations) -Controlled -Last ejection fraction: 55% -Current treatment: Metoprolol XL 50mg  daily Appropriate, Effective, Safe, Accessible -Medications previously tried: none noted  -Current home BP/HR readings: no logs today -Educated on Benefits of medications for managing symptoms and prolonging life Importance of weighing daily; if you gain more than 3 pounds in one day or 5 pounds in one week, contact providers Importance of blood pressure control -Recommended to continue current medication She does mention some slight swelling in feet and ankles, knows to contact us if it gets worse.  Osteoporosis (Goal Prevent falls/fractures) -Not ideally controlled -Last DEXA Scan: unknown - patient refused previously  -Patient is a candidate for pharmacologic treatment due to history of vertebral fracture -Current treatment  None -Medications previously tried: none  -Recommend [phone] units of vitamin D daily. Recommend 1200 mg of calcium daily from dietary and supplemental sources. Recommend weight-bearing and muscle strengthening exercises for building and maintaining bone density. She was suggested by endo to take either Prolia, fosamax at last visit and was supposed to contact office.  She is a very high fall/fracture risk so something like this may be beneficial to her. -Recommend treat ment for osteoporosis pending patient preference.  Hypothyroidism (Goal: Maintain TSH) -Controlled -Current treatment  Levothyroxine 152mcg daily Appropriate, Effective, Safe, Accessible -Medications previously tried: none noted -Takes appropriately  -Recommended to continue current medication  Patient Goals/Self-Care Activities Patient will:  - take medications as prescribed as evidenced by patient report and record review check blood pressure daily, document, and provide at future appointments  Follow Up Plan: The care management team will reach out to the patient again over the  next 30 days.      Ms. Deanna Rivera was given information about Chronic Care Management services today including:  CCM service includes personalized support from designated clinical staff supervised by her physician, including individualized plan of care and coordination with other care providers 24/7 contact phone numbers for assistance for urgent and routine care needs. Standard insurance, coinsurance, copays and deductibles apply for chronic care management only during months in which we provide at least 20 minutes of these services. Most insurances cover these services at 100%, however patients may be responsible for any copay, coinsurance and/or deductible if applicable. This service may help you avoid the need for more expensive face-to-face services. Only one practitioner may furnish and bill the service in a calendar month. The patient may stop CCM services at any time (effective at the end of the month) by phone call to the office staff.  Patient agreed to services and verbal consent obtained.   The patient verbalized understanding of instructions, educational materials, and care plan provided today and agreed to receive a mailed copy of patient instructions, educational materials, and care plan.  Telephone follow up appointment with pharmacy team member scheduled for: 4 months  Edythe Clarity, [facility]

## 2021-09-14 ENCOUNTER — Encounter: Payer: PRIVATE HEALTH INSURANCE | Admitting: Family Medicine

## 2021-09-15 ENCOUNTER — Telehealth: Payer: Self-pay

## 2021-09-15 NOTE — Telephone Encounter (Signed)
I have called patient to schedule appt.  Patient declined stating she does not have transportation.  I have advised patient to call EMS if she continues to have chest pain or to get evaluated at an UC before next week.   Patient Name: Deanna Rivera Gender: Female DOB: 07/23/1933 Age: 88 Y 35 M 10 D Return Phone Number: [phone] (Primary) Address: City/ State/ Zip: [place] [address] Client [facility] [place] Client Site [facility] [place] [facility] [place] [facility] Provider Billey Chang- MD Contact Type Call Who Is Calling Patient / Member / Family / Caregiver Call Type Triage / Clinical Relationship To Patient Self Return Phone Number [phone] (Primary) Chief Complaint CHEST PAIN - pain, pressure, heaviness or tightness Reason for Call Symptomatic / Request for [place] had COVID, wants to know if she has a chest congestion, she does not walk much, coughing, spitting, wants to know if she is having bladder as she is using the restroom a lot. Translation No Nurse Assessment Nurse: Mariel Sleet, RN, Erasmo Downer Date/Time (Eastern Time): 09/15/2021 9:16:25 AM Confirm and document reason for call. If symptomatic, describe symptoms. ---Caller states she got covid 10 days ago, is not very active, started coughing up mucous. Does the patient have any new or worsening symptoms? ---Yes Will a triage be completed? ---Yes Related visit to physician within the last 2 weeks? ---No Does the PT have any chronic conditions? (i.e. diabetes, asthma, this includes High risk factors for pregnancy, etc.) ---No Is this a behavioral health or substance abuse call? ---No Guidelines Guideline Title Affirmed Question Affirmed Notes Nurse Date/Time ([place] Time) COVID-19 - Diagnosed or Suspected [1] COVID-19 diagnosed by positive lab test (e.g., PCR, rapid self-test kit) AND [2] mild symptoms (e.g., cough, fever, others)  AND [3] no [place], RN, Erasmo Downer 09/15/2021 9:18:02 AM  Guidelines Guideline Title Affirmed Question Affirmed Notes Nurse Date/Time [place] Ghazi Time) complications or SOB Urinary Symptoms Urinating more frequently than usual (i.e., frequency) Donadeo, RN, Erasmo Downer 09/15/2021 9:23:00 AM Disp. Time Eilene Ghazi Time) Disposition Final User 09/15/2021 9:14:32 AM Send to Urgent Clarnce Flock 09/15/2021 9:22:45 [place], RN, Erasmo Downer 09/15/2021 9:24:57 AM See PCP within 24 Hours Yes Mariel Sleet, RN, Laurin Coder Disagree/Comply Comply Caller Understands Yes PreDisposition Call Doctor Care Advice Given Per Guideline HOME CARE: * You should be able to treat this at home. CALL BACK IF: * Fever over 103 F (39.4 C) * Fever lasts over 3 days * Chest pain or difficulty breathing occurs * You become worse CARE ADVICE given per COVID-19 - DIAGNOSED OR SUSPECTED (Adult) guideline. SEE PCP WITHIN 24 HOURS: * IF OFFICE WILL BE OPEN: You need to be examined within the next 24 hours. Call your doctor (or NP/PA) when the office opens and make an appointment. CALL BACK IF: * Fever occurs * Unable to urinate and bladder feels full * You become worse CARE ADVICE given per Urinary Symptoms (Adult) guideline. Referrals REFERRED TO PCP OFFICE

## 2021-09-15 NOTE — Telephone Encounter (Signed)
Please see note.

## 2021-09-17 NOTE — Telephone Encounter (Signed)
Spoke with patient.

## 2021-09-27 ENCOUNTER — Encounter: Payer: Self-pay | Admitting: Family Medicine

## 2021-09-27 ENCOUNTER — Other Ambulatory Visit: Payer: Self-pay

## 2021-09-27 ENCOUNTER — Ambulatory Visit (INDEPENDENT_AMBULATORY_CARE_PROVIDER_SITE_OTHER): Payer: Medicare Other | Admitting: Family Medicine

## 2021-09-27 VITALS — BP 130/72 | HR 60 | Temp 98.1°F | Ht 64.0 in | Wt 128.0 lb

## 2021-09-27 DIAGNOSIS — R296 Repeated falls: Secondary | ICD-10-CM

## 2021-09-27 DIAGNOSIS — R2681 Unsteadiness on feet: Secondary | ICD-10-CM

## 2021-09-27 DIAGNOSIS — R29898 Other symptoms and signs involving the musculoskeletal system: Secondary | ICD-10-CM

## 2021-09-27 DIAGNOSIS — I10 Essential (primary) hypertension: Secondary | ICD-10-CM | POA: Diagnosis not present

## 2021-09-27 DIAGNOSIS — I5032 Chronic diastolic (congestive) heart failure: Secondary | ICD-10-CM

## 2021-09-27 DIAGNOSIS — R350 Frequency of micturition: Secondary | ICD-10-CM | POA: Diagnosis not present

## 2021-09-27 DIAGNOSIS — I4819 Other persistent atrial fibrillation: Secondary | ICD-10-CM

## 2021-09-27 DIAGNOSIS — F321 Major depressive disorder, single episode, moderate: Secondary | ICD-10-CM

## 2021-09-27 DIAGNOSIS — M8000XD Age-related osteoporosis with current pathological fracture, unspecified site, subsequent encounter for fracture with routine healing: Secondary | ICD-10-CM

## 2021-09-28 LAB — URINALYSIS, ROUTINE W REFLEX MICROSCOPIC
Bilirubin Urine: NEGATIVE
Hgb urine dipstick: NEGATIVE
Ketones, ur: NEGATIVE
Leukocytes,Ua: NEGATIVE
Nitrite: NEGATIVE
RBC / HPF: NONE SEEN (ref 0–?)
Specific Gravity, Urine: 1.025 (ref 1.000–1.030)
Total Protein, Urine: NEGATIVE
Urine Glucose: NEGATIVE
Urobilinogen, UA: 0.2 (ref 0.0–1.0)
pH: 5.5 (ref 5.0–8.0)

## 2021-09-28 NOTE — Progress Notes (Signed)
Subjective  CC:  Chief Complaint  Patient presents with   Fall    Follow up. 3/4 falls since last appointment and hit her head.   Urinary Frequency    HPI: Deanna Rivera is a 88 y.o. female who presents to the office today to address the problems listed above in the chief complaint. 88 year old in a wheelchair reports she is doing terrible.  Continues to have problems with frequent falls.  Becoming more weak.  Unable to walk.  Continues to use rollator.  Husband says she leans forward and will fall.  She is trying to hold onto the brake.  She was informed to stop the rollator at her last visit.  Wheelchair and walker were ordered.  Is unclear if she has these at her home right now.  Physical therapy is coming to the home.  She is very distressed.  She wants to remain dependent but can no longer do the things she needs to do.  She lives in independent living facility.  All meals are prepared for them.  She is tearful.  She denies pain. She has multiple chronic medical problems.  Recently saw cardiology.  She is on a blood thinner.  She has osteoporosis and history of recent compression fractures.  Pain has finally subsided. She worries about a urinary tract infection because she has for the bathroom frequently. Mood: Positive depression screen.  She would prefer counseling rather than medications at this time.  Assessment  1. Urinary frequency   2. Frequent falls   3. Unsteady gait   4. Benign essential hypertension   5. Chronic heart failure with preserved ejection fraction (HCC)   6. Persistent atrial fibrillation (HCC)   7. Age-related osteoporosis with current pathological fracture with routine healing, subsequent encounter      Plan  Failure to thrive: Multifactorial.  Clinically depressed.  Refer for therapy.  Discussed adding antidepressant the patient is hesitant at this time.  She wants to remain independent but needs help.  Recommend palliative care consult.  Will refer to  neurology for further evaluation of leg weakness.  Stop rollator.  Should be using wheelchair currently to avoid further falls.  Check urine.  Will need close follow-up.  Follow up: 3 months for recheck 01/16/2022  Orders Placed This Encounter  Procedures   Urinalysis, Routine w reflex microscopic   No orders of the defined types were placed in this encounter.     I reviewed the patients updated PMH, FH, and SocHx.    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    Priority: High   Nontoxic multinodular goiter 09/08/2015    Priority: High   Current use of long term anticoagulation 05/16/2015    Priority: High   (HFpEF) heart failure with preserved ejection fraction ([place]) 09/13/2014    Priority: High   Pacemaker 11/09/2011    Priority: High   Impaired fasting glucose 05/23/2009    Priority: High   Long term current use of systemic steroids 03/30/2008    Priority: High   Polymyalgia rheumatica ([place]) 03/30/2008    Priority: High   Benign essential hypertension 05/05/2007    Priority: High   Persistent atrial fibrillation ([place]) 11/06/2006  Priority: High   Pure hypercholesterolemia 11/06/2006    Priority: High   Hypothyroidism due to Hashimoto's thyroiditis, Rx Levothyroxine 06/25/2005    Priority: High   Primary osteoarthritis of right knee 10/23/2019    Priority: Medium    Nonrheumatic mitral valve regurgitation 09/16/2015    Priority: Medium    Recurrent UTI 03/14/2015    Priority: Medium    Neuralgia, postherpetic 07/02/2019    Priority: Low   OAB (overactive bladder) 07/02/2019    Priority: Low   Constipation 01/18/2019    Priority: Low   Hashimoto's thyroiditis 06/15/2021   History of  vitamin D deficiency 06/15/2021   Frequent falls 05/17/2021   Compression fracture of body of thoracic vertebra (HCC) 02/06/2021   Shellfish allergy 11/22/2015   Synovial cyst of popliteal space 11/22/2008   Current Meds  Medication Sig   acetaminophen (TYLENOL) 500 MG tablet Take 500 mg by mouth every 6 (six) hours as needed for mild pain or headache.   apixaban (ELIQUIS) 2.5 MG TABS tablet Take 1 tablet (2.5 mg total) by mouth 2 (two) times daily.   Cholecalciferol (VITAMIN D3) 25 MCG (1000 UT) CAPS Take 1 capsule by mouth daily.   digoxin (LANOXIN) 0.125 MG tablet TAKE 1 TABLET BY MOUTH  DAILY   dorzolamide-timolol (COSOPT) 22.3-6.8 MG/ML ophthalmic solution Place 1 drop into both eyes 2 (two) times daily.   hydrochlorothiazide (HYDRODIURIL) 25 MG tablet TAKE 1 TABLET BY MOUTH  DAILY (Patient taking differently: Take 12.5 mg by mouth daily.)   latanoprost (XALATAN) 0.005 % ophthalmic solution Place 1 drop into both eyes at bedtime.   levothyroxine (SYNTHROID) 112 MCG tablet TAKE 1 TABLET BY MOUTH  DAILY (Patient taking differently: Take 112 mcg by mouth daily before breakfast.)   metoprolol succinate (TOPROL-XL) 50 MG 24 hr tablet TAKE 1 TABLET BY MOUTH  DAILY   predniSONE (DELTASONE) 1 MG tablet TAKE 1 TABLET BY MOUTH  DAILY    Allergies: Patient is allergic to shellfish-derived products, epinephrine, monosodium glutamate, ciprofloxacin, erythromycin, monosodium glutamate, sulfa antibiotics, sulfamethoxazole-trimethoprim, and tetracyclines & related. Family History: Patient family history includes Cirrhosis (age of onset: 59) in her father; Early death (age of onset: 50) in her mother; Hypertension in her brother; Lung cancer (age of onset: 21) in her brother; Scleroderma in her daughter. Social History:  Patient  reports that she has quit smoking. Her smoking use included cigarettes. She has never used smokeless tobacco. She reports that she does not currently use alcohol. She reports  that she does not currently use drugs.  Review of Systems: Constitutional: Negative for fever malaise or anorexia Cardiovascular: negative for chest pain Respiratory: negative for SOB or persistent cough Gastrointestinal: negative for abdominal pain  Objective  Vitals: BP 130/72    Pulse 60    Temp 98.1 F (36.7 C) (Temporal)    Ht 5\' 4"  (1.626 m)    Wt 128 lb (58.1 kg)    SpO2 98%    BMI 21.97 kg/m  General: In wheelchair, tearful A&Ox3 HEENT: PEERL, conjunctiva normal, neck is supple Cardiovascular:  rrr Respiratory:  Good breath sounds bilaterally, CTAB with normal respiratory effort Skin:  Warm, no rashes    Commons side effects, risks, benefits, and alternatives for medications and treatment plan prescribed today were discussed, and the patient expressed understanding of the given instructions. Patient is instructed to call or message via MyChart if he/she has any questions or concerns regarding our treatment plan. No barriers to understanding were identified. We discussed Red  Flag symptoms and signs in detail. Patient expressed understanding regarding what to do in case of urgent or emergency type symptoms.  Medication list was reconciled, printed and provided to the patient in AVS. Patient instructions and summary information was reviewed with the patient as documented in the AVS. This note was prepared with assistance of Dragon voice recognition software. Occasional wrong-word or sound-a-like substitutions may have occurred due to the inherent limitations of voice recognition software  This visit occurred during the SARS-CoV-2 public health emergency.  Safety protocols were in place, including screening questions prior to the visit, additional usage of staff PPE, and extensive cleaning of exam room while observing appropriate contact time as indicated for disinfecting solutions.

## 2021-09-28 NOTE — Progress Notes (Signed)
Deanna Rivera, can you call the lab: they did not report on the sqamous epithelial cells? Can they tell me this?? This is typically part of a UA with micro.  thanks

## 2021-09-29 ENCOUNTER — Telehealth: Payer: Self-pay | Admitting: Family Medicine

## 2021-09-29 MED ORDER — CEPHALEXIN 500 MG PO CAPS: 500.0000 mg | ORAL_CAPSULE | Freq: Two times a day (BID) | ORAL | 0 refills | Status: DC

## 2021-09-29 NOTE — Telephone Encounter (Signed)
Returned patient call  [place] Patient stated she is unsure if mention to PCP , she is feeling tired due to luck of sleep  Patient want to make sure is on her list.

## 2021-09-29 NOTE — Progress Notes (Signed)
Please call patient: her urine looks infected. I have ordered antibiotics to take.

## 2021-09-29 NOTE — Telephone Encounter (Signed)
Patient would like Dr. Tamela Oddi assistant to call her today.

## 2021-09-29 NOTE — Addendum Note (Signed)
Addended by: Billey Chang on: 09/29/2021 09:31 AM   Modules accepted: Orders

## 2021-10-03 ENCOUNTER — Telehealth: Payer: Self-pay

## 2021-10-03 NOTE — Telephone Encounter (Signed)
Spoke with patient and scheduled a Mychart Palliative Consult for 10/06/21 @ 1 PM.   Consent obtained; updated Outlook/Netsmart/Team List and Epic.

## 2021-10-06 ENCOUNTER — Other Ambulatory Visit: Payer: Self-pay

## 2021-10-06 ENCOUNTER — Telehealth: Payer: Self-pay | Admitting: Family Medicine

## 2021-10-06 DIAGNOSIS — Z515 Encounter for palliative care: Secondary | ICD-10-CM

## 2021-10-06 NOTE — Progress Notes (Signed)
[facility] Consult Note Telephone: [phone]  Fax: [phone]   Date of encounter: 10/06/2021 1:00 PM PATIENT NAME: Deanna Rivera [address] [place] [address]   [phone] (home)  DOB: [birth year] MRN: [id number] PRIMARY CARE PROVIDER:    [name], MD,  [place] [address] [phone]  REFERRING PROVIDER:   [name], [place],  [place] [address] [phone]  RESPONSIBLE PARTY:    Contact Information     Name Relation Home Work Mobile   Ayuso,STEVE Spouse   [phone]     I connected with  Deanna Rivera on 10/15/21 by a video enabled telemedicine application and verified that I am speaking with the correct person using two identifiers.   I discussed the limitations of evaluation and management by telemedicine. The patient expressed understanding and agreed to proceed.    Palliative Care was asked to follow this patient by consultation request of  [name], MD to address advance care planning and complex medical decision making. This is an initial visit.          ASSESSMENT, SYMPTOM MANAGEMENT AND PLAN / RECOMMENDATIONS:   Palliative Care Encounter Patient and spouse unsure about Palliative Services, would like to research a little on [facility] and address in an in-person visit in the next 2-3 weeks. Delay discussion of advance directives, code status and goals of care until in person follow up in 2 weeks.    Follow up Palliative Care Visit: Palliative care will follow up to see if pt interested in continuing Palliative services for complex medical decision making, advance care planning, and clarification of goals. Return 2 weeks or prn.    This visit was coded based on medical decision making (MDM).   HOSPICE ELIGIBILITY/DIAGNOSIS: TBD  Chief Complaint:  Palliative Care referral given by PCP to follow for CHF  preserved EF and frequent falls.  HISTORY OF PRESENT ILLNESS:  Deanna Rivera is a 88 y.o. year old female with CHF preserved EF, HTN, frequent falls, aortic stenosis and mitral regurgitation, persistent Afib with chronic anticoagulation, osteoarthritis of right knee and frequent falls.  Pt has gone to the hospital to have head examined with falls due to being on anticoagulation. Pt lives in [place] with her spouse.  She and spouse have reservations about Palliative Care and are concerned that they will be separated or moved to higher level of care and don't want to leave.  She states the need to think about services and is agreeable to follow up in 2 weeks but does not want director of facility to know until after they decide on whether or not she wants to be followed by Palliative.  History obtained from review of EMR, discussion with primary team, and interview with family, facility staff/caregiver and/or Deanna Rivera.  I reviewed available labs, medications, imaging, studies and related documents from the EMR.  Records reviewed and summarized above.   ROS General: NAD EYES: denies vision changes ENMT: denies dysphagia Cardiovascular: denies chest pain, denies DOE Pulmonary: denies cough, denies increased SOB Abdomen: endorses good appetite, denies constipation, endorses incontinence of bowel GU: denies dysuria, endorses continence of urine MSK:  endorses increased weakness, multiple falls Skin: denies rashes or wounds Neurological: denies pain, denies insomnia Psych: Endorses positive mood Heme/lymph/immuno: denies bruises, abnormal bleeding  Physical Exam: Current and past weights: 128 lbs as of 09/27/21 Constitutional: NAD General: thin EYES: anicteric sclera, lids intact, no discharge  ENMT: intact hearing CV: Able to speak in complete sentences without having to stop to breathe Pulmonary: No audible dyspnea or wheezing Psych: mildly anxious affect, A and O x 3   CURRENT  PROBLEM LIST:  Patient Active Problem List   Diagnosis Date Noted   Hashimoto's thyroiditis 06/15/2021   History of vitamin D deficiency 06/15/2021   Frequent falls 05/17/2021   Age-related osteoporosis with current pathological fracture 02/06/2021   Compression fracture of body of thoracic vertebra ([place]) 02/06/2021   Primary osteoarthritis of right knee 10/23/2019   Neuralgia, postherpetic 07/02/2019   OAB (overactive bladder) 07/02/2019   SSS (sick sinus syndrome) ([place]) 02/11/2019   Constipation 01/18/2019   Status post placement of cardiac pacemaker 07/08/2018   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   Shellfish allergy 11/22/2015   Nonrheumatic mitral valve regurgitation 09/16/2015   Nontoxic multinodular goiter 09/08/2015   Current use of long term anticoagulation 05/16/2015   Recurrent UTI 03/14/2015   (HFpEF) heart failure with preserved ejection fraction ([place]) 09/13/2014   Pacemaker 11/09/2011   Impaired fasting glucose 05/23/2009   Synovial cyst of popliteal space 11/22/2008   Long term current use of systemic steroids 03/30/2008   Polymyalgia rheumatica ([place]) 03/30/2008   Benign essential hypertension 05/05/2007   Persistent atrial fibrillation ([place]) 11/06/2006   Pure hypercholesterolemia 11/06/2006   Hypothyroidism due to Hashimoto's thyroiditis, Rx Levothyroxine 06/25/2005   PAST MEDICAL HISTORY:  Active Ambulatory Problems    Diagnosis Date Noted   (HFpEF) heart failure with preserved ejection fraction ([place]) 09/13/2014   Benign essential hypertension 05/05/2007   Current use of long term anticoagulation 05/16/2015   Hypothyroidism due to Hashimoto's thyroiditis, Rx Levothyroxine 06/25/2005   Impaired fasting glucose 05/23/2009   Long term current use of systemic steroids 03/30/2008   Nonrheumatic mitral valve regurgitation 09/16/2015   Nontoxic multinodular goiter 09/08/2015   Pacemaker 11/09/2011   Persistent atrial fibrillation ([place]) 11/06/2006   Polymyalgia  rheumatica ([place]) 03/30/2008   Pure hypercholesterolemia 11/06/2006   Shellfish allergy 11/22/2015   Status post placement of cardiac pacemaker 07/08/2018   Synovial cyst of popliteal space 11/22/2008   Recurrent UTI 03/14/2015   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   Constipation 01/18/2019   SSS (sick sinus syndrome) ([place]) 02/11/2019   Neuralgia, postherpetic 07/02/2019   OAB (overactive bladder) 07/02/2019   Primary osteoarthritis of right knee 10/23/2019   Age-related osteoporosis with current pathological fracture 02/06/2021   Compression fracture of body of thoracic vertebra ([place]) 02/06/2021   Frequent falls 05/17/2021   Hashimoto's thyroiditis 06/15/2021   History of vitamin D deficiency 06/15/2021   Resolved Ambulatory Problems    Diagnosis Date Noted   Cataract 11/22/2008   ESR raised 05/05/2007   Frequency of urination 03/14/2015   Mitral valve disorder 05/05/2007   Myalgia 10/17/2007   Symptomatic menopausal or female climacteric states 07/19/2008   Past Medical History:  Diagnosis Date   Hypertension    Moderate aortic stenosis    Moderate mitral regurgitation    PMR (polymyalgia rheumatica) (HCC)    Second degree AV block    Sick sinus syndrome (HCC)    Thyroid disease    SOCIAL HX:  Social History   Tobacco Use   Smoking status: Former    Types: Cigarettes   Smokeless tobacco: Never  Substance Use Topics   Alcohol use: Not Currently   FAMILY HX:  Family History  Problem Relation Age of Onset   Early death Mother 38   Hypertension Brother    Cirrhosis Father  61   Lung cancer Brother 73   Scleroderma Daughter        Preferred Pharmacy: ALLERGIES:  Allergies  Allergen Reactions   Shellfish-Derived Products Shortness Of Breath and Other (See Comments)    Lips go numb   Epinephrine Other (See Comments) and Hypertension    "Makes me feel wired"   Monosodium Glutamate Other (See Comments)    other   Ciprofloxacin Other (See Comments)     Allergic reaction not recalled   Erythromycin Other (See Comments)    Allergic reaction not recalled   Monosodium Glutamate Other (See Comments) and Hypertension    "Makes me feel badly"   Sulfa Antibiotics Other (See Comments)    Allergic reaction not recalled   Sulfamethoxazole-Trimethoprim    Tetracyclines & Related Other (See Comments)    Allergic reaction not recalled     PERTINENT MEDICATIONS:  Outpatient Encounter Medications as of 10/06/2021  Medication Sig   acetaminophen (TYLENOL) 500 MG tablet Take 500 mg by mouth every 6 (six) hours as needed for mild pain or headache.   apixaban (ELIQUIS) 2.5 MG TABS tablet Take 1 tablet (2.5 mg total) by mouth 2 (two) times daily.   cephALEXin (KEFLEX) 500 MG capsule Take 1 capsule (500 mg total) by mouth 2 (two) times daily.   Cholecalciferol (VITAMIN D3) 25 MCG (1000 UT) CAPS Take 1 capsule by mouth daily.   digoxin (LANOXIN) 0.125 MG tablet TAKE 1 TABLET BY MOUTH  DAILY   dorzolamide-timolol (COSOPT) 22.3-6.8 MG/ML ophthalmic solution Place 1 drop into both eyes 2 (two) times daily.   hydrochlorothiazide (HYDRODIURIL) 25 MG tablet TAKE 1 TABLET BY MOUTH  DAILY (Patient taking differently: Take 12.5 mg by mouth daily.)   latanoprost (XALATAN) 0.005 % ophthalmic solution Place 1 drop into both eyes at bedtime.   levothyroxine (SYNTHROID) 112 MCG tablet TAKE 1 TABLET BY MOUTH  DAILY (Patient taking differently: Take 112 mcg by mouth daily before breakfast.)   metoprolol succinate (TOPROL-XL) 50 MG 24 hr tablet TAKE 1 TABLET BY MOUTH  DAILY   predniSONE (DELTASONE) 1 MG tablet TAKE 1 TABLET BY MOUTH  DAILY   No facility-administered encounter medications on file as of 10/06/2021.     Thank you for the opportunity to participate in the care of Ms. Wooley.  The palliative care team will continue to follow. Please call our office at [phone] if we can be of additional assistance.   Marijo Conception, FNP-C  COVID-19 PATIENT SCREENING  TOOL Asked and negative response unless otherwise noted:  Have you had symptoms of covid, tested positive or been in contact with someone with symptoms/positive test in the past 5-10 days?  no

## 2021-10-15 ENCOUNTER — Encounter: Payer: Self-pay | Admitting: Family Medicine

## 2021-10-15 DIAGNOSIS — Z515 Encounter for palliative care: Secondary | ICD-10-CM | POA: Insufficient documentation

## 2021-10-18 ENCOUNTER — Other Ambulatory Visit: Payer: Self-pay | Admitting: Family Medicine

## 2021-10-18 ENCOUNTER — Telehealth: Payer: Self-pay | Admitting: Pharmacist

## 2021-10-18 NOTE — Progress Notes (Addendum)
? ? ? ?Chronic Care Management ?Pharmacy Assistant  ? ?Name: Deanna Rivera  MRN: [id number] DOB: 04-25-1933 ? ? ?Reason for Encounter: Hypertension Adherence Call ?  ? ?Recent office visits:  ?09/27/2021 OV (PCP) [name], MD; Rx antibiotic for infected urine. ? ?Recent consult visits:  ?10/06/2021 VV (Palliative care) Marijo Conception, FNP; no medication changes indicated. - Patient and spouse unsure about Palliative Services, would like to research a little on [facility] and address in an in-person visit in the next 2-3 weeks ? ?Hospital visits:  ?None since last CPP visit ? ?Medications: ?Outpatient Encounter Medications as of 10/18/2021  ?Medication Sig  ? acetaminophen (TYLENOL) 500 MG tablet Take 500 mg by mouth every 6 (six) hours as needed for mild pain or headache.  ? apixaban (ELIQUIS) 2.5 MG TABS tablet Take 1 tablet (2.5 mg total) by mouth 2 (two) times daily.  ? cephALEXin (KEFLEX) 500 MG capsule Take 1 capsule (500 mg total) by mouth 2 (two) times daily.  ? Cholecalciferol (VITAMIN D3) 25 MCG (1000 UT) CAPS Take 1 capsule by mouth daily.  ? digoxin (LANOXIN) 0.125 MG tablet TAKE 1 TABLET BY MOUTH  DAILY  ? dorzolamide-timolol (COSOPT) 22.3-6.8 MG/ML ophthalmic solution Place 1 drop into both eyes 2 (two) times daily.  ? hydrochlorothiazide (HYDRODIURIL) 25 MG tablet TAKE 1 TABLET BY MOUTH  DAILY (Patient taking differently: Take 12.5 mg by mouth daily.)  ? latanoprost (XALATAN) 0.005 % ophthalmic solution Place 1 drop into both eyes at bedtime.  ? levothyroxine (SYNTHROID) 112 MCG tablet TAKE 1 TABLET BY MOUTH  DAILY (Patient taking differently: Take 112 mcg by mouth daily before breakfast.)  ? metoprolol succinate (TOPROL-XL) 50 MG 24 hr tablet TAKE 1 TABLET BY MOUTH  DAILY  ? predniSONE (DELTASONE) 1 MG tablet TAKE 1 TABLET BY MOUTH  DAILY  ? ?No facility-administered encounter medications on file as of 10/18/2021.  ? ?Reviewed chart prior to disease state call. Spoke with patient  regarding BP ? ?Recent Office Vitals: ?BP Readings from Last 3 Encounters:  ?09/27/21 130/72  ?09/04/21 (!) 195/101  ?08/31/21 128/72  ? ?Pulse Readings from Last 3 Encounters:  ?09/27/21 60  ?09/04/21 61  ?08/31/21 60  ?  ?Wt Readings from Last 3 Encounters:  ?09/27/21 128 lb (58.1 kg)  ?09/04/21 132 lb (59.9 kg)  ?08/31/21 132 lb (59.9 kg)  ?  ? ?Kidney Function ?Lab Results  ?Component Value Date/Time  ? CREATININE 0.30 (L) 09/04/2021 07:53 PM  ? CREATININE 0.36 (L) 09/04/2021 07:40 PM  ? CREATININE 0.34 (L) 07/28/2020 11:31 AM  ? CREATININE 0.35 (L) 03/08/2020 11:55 AM  ? GFR 189.54 10/27/2019 10:10 AM  ? GFRNONAA >60 09/04/2021 07:40 PM  ? GFRNONAA 100 07/28/2020 11:31 AM  ? GFRAA 116 07/28/2020 11:31 AM  ? ? ?BMP Latest Ref Rng & Units 09/04/2021 09/04/2021 08/09/2021  ?Glucose 70 - 99 mg/dL 109(H) 107(H) 104(H)  ?BUN 8 - 23 mg/dL 24(H) 20 20  ?Creatinine 0.44 - 1.00 mg/dL 0.30(L) 0.36(L) 0.34(L)  ?BUN/Creat Ratio 12 - 28 - - -  ?Sodium 135 - 145 mmol/L 134(L) 132(L) 136  ?Potassium 3.5 - 5.1 mmol/L 3.8 3.9 3.7  ?Chloride 98 - 111 mmol/L 96(L) 96(L) 98  ?CO2 22 - 32 mmol/L - 27 31  ?Calcium 8.9 - 10.3 mg/dL - 10.3 10.2  ? ? ?Current antihypertensive regimen:  ?Metoprolol Succinate 50 mg daily ?HCTZ 12.5 mg ? ?How often are you checking your Blood Pressure? daily ? ?Current home BP readings:  Patient states "they're fine". She did not have any exact numbers to report. She states she was in the car and did not have them available. ? ?What recent interventions/DTPs have been made by any provider to improve Blood Pressure control since last CPP Visit: No recent interventions or DTPs. ? ?Any recent hospitalizations or ED visits since last visit with CPP? No ? ?What diet changes have been made to improve Blood Pressure Control?  ?Patient states she is still following a low salt diet. ? ?What exercise is being done to improve your Blood Pressure Control?  ?Patient states she is still doing physical therapy. ? ?Adherence  Review: ?Is the patient currently on ACE/ARB medication? No ?Does the patient have >5 day gap between last estimated fill dates? No ? ?Patient states she wants to know if it's okay to take Melatonin gummies with the medications she takes. ? ? ?Care Gaps: ?Medicare Annual Wellness: Completed 02/06/2021 ?Hemoglobin A1C: 5.8% on 07/28/2020 ?Colonoscopy: Aged out ?Dexa Scan: Discontinued ?Mammogram: Aged out ? ?Future Appointments  ?Date Time Provider [place]  ?11/08/2021  8:15 AM [facility] DEVICE REMOTES [facility]  ?12/27/2021  2:30 PM [name], MD [facility]  ?01/09/2022 12:50 PM [facility] ECHO 2 [facility]  ?01/16/2022  2:45 PM [facility] CCM PHARMACIST [facility]  ?02/07/2022  8:15 AM [facility] DEVICE REMOTES [facility]  ? ? ?Star Rating Drugs: ?None ? ?April D Calhoun, [place] ?Clinical Pharmacist Assistant ?[phone]  ? ?5 minutes spent in review, coordination, and documentation. ?Ok with melatonin gummies. ? ?Reviewed by: ?Beverly Milch, PharmD ?Clinical Pharmacist ?[phone] ? ? ?

## 2021-10-31 ENCOUNTER — Telehealth: Payer: Self-pay | Admitting: Family Medicine

## 2021-10-31 NOTE — Telephone Encounter (Signed)
Pt called wanting to be sch with neurology - I reached out to [facility] and spoke to [facility] via message. (+ In basket message) Kenna Gilbert was very helpful and provided the patient with an apt in July, unfortunately the patient declined the apt. Patient is not willing to wait until July to be seen. Patient was also offered to be added into a cancellation list, patient declined as well.  ? ?Patient would like to be referred somewhere else. Awaiting Dr Toy Baker response. Pt stated she is in pallative care.  ?

## 2021-11-03 ENCOUNTER — Encounter: Payer: Self-pay | Admitting: Neurology

## 2021-11-03 NOTE — Telephone Encounter (Signed)
Message sent thru MyChart regarding referral ? ?

## 2021-11-08 ENCOUNTER — Ambulatory Visit (INDEPENDENT_AMBULATORY_CARE_PROVIDER_SITE_OTHER): Payer: Medicare Other

## 2021-11-08 DIAGNOSIS — I495 Sick sinus syndrome: Secondary | ICD-10-CM | POA: Diagnosis not present

## 2021-11-08 LAB — CUP PACEART REMOTE DEVICE CHECK
Battery Remaining Longevity: 156 mo
Battery Remaining Percentage: 100 %
Brady Statistic RA Percent Paced: 0 %
Brady Statistic RV Percent Paced: 74 %
Date Time Interrogation Session: 20230329064100
Implantable Lead Implant Date: 20130305
Implantable Lead Implant Date: 20130305
Implantable Lead Location: 753859
Implantable Lead Location: 753860
Implantable Lead Model: 4469
Implantable Lead Model: 4470
Implantable Lead Serial Number: 546229
Implantable Lead Serial Number: 712016
Implantable Pulse Generator Implant Date: 20200409
Lead Channel Impedance Value: 424 Ohm
Lead Channel Pacing Threshold Amplitude: 0.7 V
Lead Channel Pacing Threshold Pulse Width: 0.4 ms
Lead Channel Setting Pacing Amplitude: 1.1 V
Lead Channel Setting Pacing Pulse Width: 0.4 ms
Lead Channel Setting Sensing Sensitivity: 0.6 mV
Pulse Gen Serial Number: 859007

## 2021-11-14 ENCOUNTER — Ambulatory Visit (INDEPENDENT_AMBULATORY_CARE_PROVIDER_SITE_OTHER): Payer: Medicare Other

## 2021-11-14 DIAGNOSIS — Z0000 Encounter for general adult medical examination without abnormal findings: Secondary | ICD-10-CM

## 2021-11-14 NOTE — Progress Notes (Signed)
Virtual Visit via Telephone Note ? ?I connected with  Deanna Rivera on 11/14/21 at 10:00 AM EDT by telephone and verified that I am speaking with the correct person using two identifiers. ? ?Medicare Annual Wellness visit completed telephonically due to Covid-19 pandemic.  ? ?Persons participating in this call: This Health Coach and this patient.  ? ?Location: ?Patient: Home ?Provider: Office ?  ?I discussed the limitations, risks, security and privacy concerns of performing an evaluation and management service by telephone and the availability of in person appointments. The patient expressed understanding and agreed to proceed. ? ?Unable to perform video visit due to video visit attempted and failed and/or patient does not have video capability.  ? ?Some vital signs may be absent or patient reported.  ? ?Willette Brace, LPN ? ? ?Subjective:  ? Deanna Rivera is a 88 y.o. female who presents for Medicare Annual (Subsequent) preventive examination. ? ?Review of Systems    ? ?Cardiac Risk Factors include: advanced age (>64mn, >>59women);hypertension ? ?   ?Objective:  ?  ?There were no vitals filed for this visit. ?There is no height or weight on file to calculate BMI. ? ? ?  11/14/2021  ? 10:14 AM 09/04/2021  ?  7:56 PM 08/09/2021  ?  4:20 PM 11/12/2019  ? 11:37 AM  ?Advanced Directives  ?Does Patient Have a Medical Advance Directive? Yes No Yes Yes  ?[place] of [place] Living will;Healthcare Power of Attorney  ?Does patient want to make changes to medical advance directive?    No - Patient declined  ?[place] of [place]? No - copy requested   No - copy requested  ?Would patient like information on creating a medical advance directive?  No - Patient declined    ? ? ?Current Medications (verified) ?Outpatient Encounter Medications as of 11/14/2021  ?Medication Sig  ? acetaminophen (TYLENOL) 500 MG tablet Take 500 mg by  mouth every 6 (six) hours as needed for mild pain or headache.  ? apixaban (ELIQUIS) 2.5 MG TABS tablet Take 1 tablet (2.5 mg total) by mouth 2 (two) times daily.  ? cephALEXin (KEFLEX) 500 MG capsule Take 1 capsule (500 mg total) by mouth 2 (two) times daily.  ? Cholecalciferol (VITAMIN D3) 25 MCG (1000 UT) CAPS Take 1 capsule by mouth daily.  ? digoxin (LANOXIN) 0.125 MG tablet TAKE 1 TABLET BY MOUTH  DAILY  ? dorzolamide-timolol (COSOPT) 22.3-6.8 MG/ML ophthalmic solution Place 1 drop into both eyes 2 (two) times daily.  ? hydrochlorothiazide (HYDRODIURIL) 25 MG tablet TAKE 1 TABLET BY MOUTH  DAILY  ? latanoprost (XALATAN) 0.005 % ophthalmic solution Place 1 drop into both eyes at bedtime.  ? levothyroxine (SYNTHROID) 112 MCG tablet TAKE 1 TABLET BY MOUTH  DAILY (Patient taking differently: Take 112 mcg by mouth daily before breakfast.)  ? metoprolol succinate (TOPROL-XL) 50 MG 24 hr tablet TAKE 1 TABLET BY MOUTH  DAILY  ? predniSONE (DELTASONE) 1 MG tablet TAKE 1 TABLET BY MOUTH  DAILY  ? ?No facility-administered encounter medications on file as of 11/14/2021.  ? ? ?Allergies (verified) ?Shellfish-derived products, Epinephrine, Monosodium glutamate, Ciprofloxacin, Erythromycin, Monosodium glutamate, Sulfa antibiotics, Sulfamethoxazole-trimethoprim, and Tetracyclines & related  ? ?History: ?Past Medical History:  ?Diagnosis Date  ? Cataract   ? Hypertension   ? Moderate aortic stenosis   ? Moderate mitral regurgitation   ? Persistent atrial fibrillation ([facility]   ? PMR (polymyalgia  rheumatica) ([place])   ? Second degree AV block   ? Sick sinus syndrome ([place])   ? Thyroid disease   ? ?Past Surgical History:  ?Procedure Laterality Date  ? ABDOMINAL HYSTERECTOMY    ? APPENDECTOMY    ? CESAREAN SECTION    ? x 3  ? CHOLECYSTECTOMY    ? PACEMAKER GENERATOR CHANGE  11/20/2018  ? [employer] Accolade MRI PPM implanted in [place]  ? PACEMAKER IMPLANT    ? PILONIDAL CYST EXCISION    ? ?Family History  ?Problem Relation  Age of Onset  ? Early death Mother 5  ? Hypertension Brother   ? Cirrhosis Father 37  ? Lung cancer Brother 14  ? Scleroderma Daughter   ? ?Social History  ? ?Socioeconomic History  ? Marital status: Married  ?  Spouse name: Not on file  ? Number of children: Not on file  ? Years of education: Not on file  ? Highest education level: Not on file  ?Occupational History  ? Occupation: Retired   ?  Comment: Kindergarten Teacher   ?Tobacco Use  ? Smoking status: Former  ?  Types: Cigarettes  ? Smokeless tobacco: Never  ?Substance and Sexual Activity  ? Alcohol use: Not Currently  ? Drug use: Not Currently  ? Sexual activity: Not Currently  ?  Birth control/protection: Post-menopausal  ?Other Topics Concern  ? Not on file  ?Social History Narrative  ? ** Merged History Encounter **  ?    ? Recently moved from [place]. In independent living with her husband. Daughter died from scleroderma complications. Hx of falls, but last was > 1.5 years ago. 01/18/2019   ? ?Social Determinants of Health  ? ?Financial Resource Strain: Low Risk   ? Difficulty of Paying Living Expenses: Not hard at all  ?Food Insecurity: No Food Insecurity  ? Worried About Charity fundraiser in the Last Year: Never true  ? Ran Out of Food in the Last Year: Never true  ?Transportation Needs: No Transportation Needs  ? Lack of Transportation (Medical): No  ? Lack of Transportation (Non-Medical): No  ?Physical Activity: Inactive  ? Days of Exercise per Week: 0 days  ? Minutes of Exercise per Session: 0 min  ?Stress: Stress Concern Present  ? Feeling of Stress : Rather much  ?Social Connections: Moderately Integrated  ? Frequency of Communication with Friends and Family: More than three times a week  ? Frequency of Social Gatherings with Friends and Family: More than three times a week  ? Attends Religious Services: More than 4 times per year  ? Active Member of Clubs or Organizations: No  ? Attends Archivist Meetings: Never  ? Marital Status:  Married  ? ? ?Tobacco Counseling ?Counseling given: Not Answered ? ? ?Clinical Intake: ? ?Pre-visit preparation completed: Yes ? ?Pain : No/denies pain ? ?  ? ?BMI - recorded: 21.97 ?Nutritional Status: BMI of 19-24  Normal ?Nutritional Risks: None ?Diabetes: No ? ?How often do you need to have someone help you when you read instructions, pamphlets, or other written materials from your doctor or pharmacy?: 1 - Never ? ?Diabetic?No ? ?Interpreter Needed?: No ? ?Information entered by :: Charlott Rakes, LPn ? ? ?Activities of Daily Living ? ?  11/14/2021  ? 10:16 AM  ?In your present state of health, do you have any difficulty performing the following activities:  ?Hearing? 1  ?Comment HOH  ?Vision? 0  ?Difficulty concentrating or making decisions? 0  ?Walking  or climbing stairs? 0  ?Comment don't do stairs  ?Dressing or bathing? 0  ?Doing errands, shopping? 0  ?Preparing Food and eating ? N  ?Using the Toilet? N  ?In the past six months, have you accidently leaked urine? Y  ?Comment incontinent at times  ?Do you have problems with loss of bowel control? Y  ?Managing your Medications? N  ?Managing your Finances? N  ?Housekeeping or managing your Housekeeping? N  ? ? ?Patient Care Team: ?[name], MD as PCP - General (Family Medicine) ?Thompson Grayer, MD as PCP - Electrophysiology (Cardiology) ?Thompson Grayer, MD as Consulting Physician (Cardiology) ?Georgia Lopes as Physician Assistant (Orthopedic Surgery) ?Hermelinda Medicus, MD as Consulting Physician (Rheumatology) ?[name], MD (Family Medicine) ?Edythe Clarity, [facility] as Pharmacist (Pharmacist) ? ?Indicate any recent Medical Services you may have received from other than [facility] providers in the past year (date may be approximate). ? ?   ?Assessment:  ? This is a routine wellness examination for [facility]. ? ?Hearing/Vision screen ?Hearing Screening - Comments:: Pt has hearing aids stated HOH ?Vision Screening - Comments:: Pt follows up Dr Katy Fitch for  annual eye exams  ? ?Dietary issues and exercise activities discussed: ?Current Exercise Habits: The patient does not participate in regular exercise at present ? ? Goals Addressed   ? ?  ?  ?  ?  ? T

## 2021-11-14 NOTE — Patient Instructions (Addendum)
Deanna Rivera , ?Thank you for taking time to come for your Medicare Wellness Visit. I appreciate your ongoing commitment to your health goals. Please review the following plan we discussed and let me know if I can assist you in the future.  ? ?Screening recommendations/referrals: ?Colonoscopy: No longer required  ?Mammogram: No longer required  ?Bone Density: Discontinued ?Recommended yearly ophthalmology/optometry visit for glaucoma screening and checkup ?Recommended yearly dental visit for hygiene and checkup ? ?Vaccinations: ?Influenza vaccine: pt stated  ?Pneumococcal vaccine: Due ?Tdap vaccine: Done 09/04/21 ?Shingles vaccine: Shingrix discussed. Please contact your pharmacy for coverage information.    ?Covid-19:completed 08/12/19, 1/27, 06/14/20 ? ?Advanced directives: Please bring a copy of your health care power of attorney and living will to the office at your convenience. ? ?Conditions/risks identified: Get this special walker to be able to walk again ? ?Next appointment: Follow up in one year for your annual wellness visit  ? ? ?Preventive Care 53 Years and Older, Female ?Preventive care refers to lifestyle choices and visits with your health care provider that can promote health and wellness. ?What does preventive care include? ?A yearly physical exam. This is also called an annual well check. ?Dental exams once or twice a year. ?Routine eye exams. Ask your health care provider how often you should have your eyes checked. ?Personal lifestyle choices, including: ?Daily care of your teeth and gums. ?Regular physical activity. ?Eating a healthy diet. ?Avoiding tobacco and drug use. ?Limiting alcohol use. ?Practicing safe sex. ?Taking low-dose aspirin every day. ?Taking vitamin and mineral supplements as recommended by your health care provider. ?What happens during an annual well check? ?The services and screenings done by your health care provider during your annual well check will depend on your age,  overall health, lifestyle risk factors, and family history of disease. ?Counseling  ?Your health care provider may ask you questions about your: ?Alcohol use. ?Tobacco use. ?Drug use. ?Emotional well-being. ?Home and relationship well-being. ?Sexual activity. ?Eating habits. ?History of falls. ?Memory and ability to understand (cognition). ?Work and work Statistician. ?Reproductive health. ?Screening  ?You may have the following tests or measurements: ?Height, weight, and BMI. ?Blood pressure. ?Lipid and cholesterol levels. These may be checked every 5 years, or more frequently if you are over 57 years old. ?Skin check. ?Lung cancer screening. You may have this screening every year starting at age 5 if you have a 30-pack-year history of smoking and currently smoke or have quit within the past 15 years. ?Fecal occult blood test (FOBT) of the stool. You may have this test every year starting at age 82. ?Flexible sigmoidoscopy or colonoscopy. You may have a sigmoidoscopy every 5 years or a colonoscopy every 10 years starting at age 73. ?Hepatitis C blood test. ?Hepatitis B blood test. ?Sexually transmitted disease (STD) testing. ?Diabetes screening. This is done by checking your blood sugar (glucose) after you have not eaten for a while (fasting). You may have this done every 1-3 years. ?Bone density scan. This is done to screen for osteoporosis. You may have this done starting at age 41. ?Mammogram. This may be done every 1-2 years. Talk to your health care provider about how often you should have regular mammograms. ?Talk with your health care provider about your test results, treatment options, and if necessary, the need for more tests. ?Vaccines  ?Your health care provider may recommend certain vaccines, such as: ?Influenza vaccine. This is recommended every year. ?Tetanus, diphtheria, and acellular pertussis (Tdap, Td) vaccine. You may need a  Td booster every 10 years. ?Zoster vaccine. You may need this after age  27. ?Pneumococcal 13-valent conjugate (PCV13) vaccine. One dose is recommended after age 23. ?Pneumococcal polysaccharide (PPSV23) vaccine. One dose is recommended after age 93. ?Talk to your health care provider about which screenings and vaccines you need and how often you need them. ?This information is not intended to replace advice given to you by your health care provider. Make sure you discuss any questions you have with your health care provider. ?Document Released: 08/26/2015 Document Revised: 04/18/2016 Document Reviewed: 05/31/2015 ?Elsevier Interactive Patient Education ? 2017 [place]. ? ?Fall Prevention in the Home ?Falls can cause injuries. They can happen to people of all ages. There are many things you can do to make your home safe and to help prevent falls. ?What can I do on the outside of my home? ?Regularly fix the edges of walkways and driveways and fix any cracks. ?Remove anything that might make you trip as you walk through a door, such as a raised step or threshold. ?Trim any bushes or trees on the path to your home. ?Use bright outdoor lighting. ?Clear any walking paths of anything that might make someone trip, such as rocks or tools. ?Regularly check to see if handrails are loose or broken. Make sure that both sides of any steps have handrails. ?Any raised decks and porches should have guardrails on the edges. ?Have any leaves, snow, or ice cleared regularly. ?Use sand or salt on walking paths during winter. ?Clean up any spills in your garage right away. This includes oil or grease spills. ?What can I do in the bathroom? ?Use night lights. ?Install grab bars by the toilet and in the tub and shower. Do not use towel bars as grab bars. ?Use non-skid mats or decals in the tub or shower. ?If you need to sit down in the shower, use a plastic, non-slip stool. ?Keep the floor dry. Clean up any water that spills on the floor as soon as it happens. ?Remove soap buildup in the tub or shower  regularly. ?Attach bath mats securely with double-sided non-slip rug tape. ?Do not have throw rugs and other things on the floor that can make you trip. ?What can I do in the bedroom? ?Use night lights. ?Make sure that you have a light by your bed that is easy to reach. ?Do not use any sheets or blankets that are too big for your bed. They should not hang down onto the floor. ?Have a firm chair that has side arms. You can use this for support while you get dressed. ?Do not have throw rugs and other things on the floor that can make you trip. ?What can I do in the kitchen? ?Clean up any spills right away. ?Avoid walking on wet floors. ?Keep items that you use a lot in easy-to-reach places. ?If you need to reach something above you, use a strong step stool that has a grab bar. ?Keep electrical cords out of the way. ?Do not use floor polish or wax that makes floors slippery. If you must use wax, use non-skid floor wax. ?Do not have throw rugs and other things on the floor that can make you trip. ?What can I do with my stairs? ?Do not leave any items on the stairs. ?Make sure that there are handrails on both sides of the stairs and use them. Fix handrails that are broken or loose. Make sure that handrails are as long as the stairways. ?Check any  carpeting to make sure that it is firmly attached to the stairs. Fix any carpet that is loose or worn. ?Avoid having throw rugs at the top or bottom of the stairs. If you do have throw rugs, attach them to the floor with carpet tape. ?Make sure that you have a light switch at the top of the stairs and the bottom of the stairs. If you do not have them, ask someone to add them for you. ?What else can I do to help prevent falls? ?Wear shoes that: ?Do not have high heels. ?Have rubber bottoms. ?Are comfortable and fit you well. ?Are closed at the toe. Do not wear sandals. ?If you use a stepladder: ?Make sure that it is fully opened. Do not climb a closed stepladder. ?Make sure that  both sides of the stepladder are locked into place. ?Ask someone to hold it for you, if possible. ?Clearly mark and make sure that you can see: ?Any grab bars or handrails. ?First and last steps. ?Where the edg

## 2021-11-21 NOTE — Progress Notes (Signed)
Remote pacemaker transmission.   

## 2021-12-01 ENCOUNTER — Other Ambulatory Visit: Payer: Self-pay | Admitting: Family Medicine

## 2021-12-08 ENCOUNTER — Telehealth: Payer: Self-pay

## 2021-12-08 NOTE — Telephone Encounter (Signed)
Would like a call back.  Would not disclose reason other than she needed to discuss something she needs approval on.

## 2021-12-08 NOTE — Telephone Encounter (Signed)
I have spoken with pt and they are going to fax over the papers for Deanna Rivera to fill out for a wheelchair ?

## 2021-12-14 ENCOUNTER — Ambulatory Visit: Payer: Medicare Other | Admitting: Neurology

## 2021-12-19 ENCOUNTER — Telehealth: Payer: Self-pay | Admitting: Family Medicine

## 2021-12-19 NOTE — Telephone Encounter (Signed)
Pt states she received a bill from a collection agency for "palliative care" ?Pt states she never received or approved "palliative care" for herself. ? ?Pt request palliative care to be removed from her chart. ? ?Pt declined number for billing ?Pt declined number for medical records ? ?Pt requests "[name] or her Assistant to call back today (05/09)." ?

## 2021-12-27 ENCOUNTER — Ambulatory Visit (INDEPENDENT_AMBULATORY_CARE_PROVIDER_SITE_OTHER): Payer: Medicare Other | Admitting: Family Medicine

## 2021-12-27 ENCOUNTER — Encounter: Payer: Self-pay | Admitting: Family Medicine

## 2021-12-27 VITALS — BP 130/80 | HR 62 | Temp 97.7°F | Ht 64.0 in | Wt 130.0 lb

## 2021-12-27 DIAGNOSIS — I5032 Chronic diastolic (congestive) heart failure: Secondary | ICD-10-CM

## 2021-12-27 DIAGNOSIS — R269 Unspecified abnormalities of gait and mobility: Secondary | ICD-10-CM | POA: Diagnosis not present

## 2021-12-27 DIAGNOSIS — I10 Essential (primary) hypertension: Secondary | ICD-10-CM | POA: Diagnosis not present

## 2021-12-27 DIAGNOSIS — R296 Repeated falls: Secondary | ICD-10-CM | POA: Diagnosis not present

## 2021-12-27 DIAGNOSIS — I4819 Other persistent atrial fibrillation: Secondary | ICD-10-CM | POA: Diagnosis not present

## 2021-12-27 DIAGNOSIS — I495 Sick sinus syndrome: Secondary | ICD-10-CM | POA: Diagnosis not present

## 2021-12-27 DIAGNOSIS — M353 Polymyalgia rheumatica: Secondary | ICD-10-CM

## 2021-12-27 NOTE — Progress Notes (Signed)
? ?Subjective  ?CC:  ?Chief Complaint  ?Patient presents with  ? Follow-up  ?  Pt here to F/U with getting help at home and remaining independant  ? ? ?HPI: Deanna Rivera is a 88 y.o. female who presents to the office today to address the problems listed above in the chief complaint. ?64-monthfollow-up.  Patient remained stable although does not like that she is unsteady needing to use wheelchair most of the time.  She does complain of increasing lower extremity edema mainly confined to her ankles.  She does follow-up with cardiology for her arrhythmias and heart failure.  She had an echocardiogram scheduled for June. ?She is requesting ambulatory assistance method.  However she has had multiple falls including fractures.  We did refer to neurology but she cannot get an appointment for many months.  She finds this very frustrating. ?Reviewed medications in detail. ?Assessment  ?1. Chronic heart failure with preserved ejection fraction ([facility]   ?2. Gait abnormality   ?3. Frequent falls   ?4. Benign essential hypertension   ?5. Persistent atrial fibrillation ([place]   ?6. SSS (sick sinus syndrome) ([place]   ?7. Polymyalgia rheumatica ([place]   ? ?  ?Plan  ?Gait abnormality with frequent falls: She has failed a rollator, cane.  She is now requesting a  U-step 2 walker.  I recommend referral to geriatric specialist to further assist in gait assessment and recommendations for appropriate care.  She has had multiple rounds of physical therapy as well. ?Lower extremity edema, mainly in the ankles.  Recommend compression hose and elevation of legs we will check lab work. ?Cardiology following her heart conditions. ?Monitoring thyroid and blood counts. ? ?Follow up: 6 months for recheck ?01/16/2022 ? ?Orders Placed This Encounter  ?Procedures  ? Comprehensive metabolic panel  ? CBC with Differential/Platelet  ? TSH  ? Ambulatory referral to Geriatrics  ? ?No orders of the defined types were placed in this encounter. ? ?  ? ?I  reviewed the patients updated PMH, FH, and SocHx.  ?  ?Patient Active Problem List  ? Diagnosis Date Noted  ? Age-related osteoporosis with current pathological fracture 02/06/2021  ?  Priority: High  ? SSS (sick sinus syndrome) ([place] 02/11/2019  ?  Priority: High  ? Status post placement of cardiac pacemaker 07/08/2018  ?  Priority: High  ? 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  Priority: High  ? Nontoxic multinodular goiter 09/08/2015  ?  Priority: High  ? Current use of long term anticoagulation 05/16/2015  ?  Priority: High  ? (HFpEF) heart failure with preserved ejection fraction ([place] 09/13/2014  ?  Priority: High  ? Pacemaker 11/09/2011  ?  Priority: High  ? Impaired fasting glucose 05/23/2009  ?  Priority: High  ? Long term current use of systemic steroids 03/30/2008  ?  Priority: High  ? Polymyalgia rheumatica ([place] 03/30/2008  ?  Priority: High  ? Benign essential hypertension 05/05/2007  ?  Priority: High  ? Persistent atrial fibrillation (HE[place] 11/06/2006  ?  Priority: High  ? Pure hypercholesterolemia 11/06/2006  ?  Priority: High  ? Hypothyroidism due to Hashimoto's thyroiditis, Rx Levothyroxine 06/25/2005  ?  Priority: High  ? Primary osteoarthritis of right knee 10/23/2019  ?  Priority: Medium   ? Nonrheumatic mitral valve regurgitation 09/16/2015  ?  Priority: Medium   ? Recurrent UTI 03/14/2015  ?  Priority: Medium   ? Neuralgia, postherpetic 07/02/2019  ?  Priority: Low  ? OAB (overactive bladder)  07/02/2019  ?  Priority: Low  ? Constipation 01/18/2019  ?  Priority: Low  ? Hashimoto's thyroiditis 06/15/2021  ? History of vitamin D deficiency 06/15/2021  ? Frequent falls 05/17/2021  ? Compression fracture of body of thoracic vertebra ([place]) 02/06/2021  ? Shellfish allergy 11/22/2015  ? Synovial cyst of popliteal space 11/22/2008  ? ?Current Meds  ?Medication Sig  ? acetaminophen (TYLENOL) 500 MG tablet Take 500 mg by mouth every 6 (six) hours as needed for mild pain or headache.  ? apixaban (ELIQUIS)  2.5 MG TABS tablet Take 1 tablet (2.5 mg total) by mouth 2 (two) times daily.  ? cephALEXin (KEFLEX) 500 MG capsule Take 1 capsule (500 mg total) by mouth 2 (two) times daily.  ? Cholecalciferol (VITAMIN D3) 25 MCG (1000 UT) CAPS Take 1 capsule by mouth daily.  ? digoxin (LANOXIN) 0.125 MG tablet TAKE 1 TABLET BY MOUTH  DAILY  ? dorzolamide-timolol (COSOPT) 22.3-6.8 MG/ML ophthalmic solution Place 1 drop into both eyes 2 (two) times daily.  ? hydrochlorothiazide (HYDRODIURIL) 25 MG tablet TAKE 1 TABLET BY MOUTH  DAILY  ? latanoprost (XALATAN) 0.005 % ophthalmic solution Place 1 drop into both eyes at bedtime.  ? levothyroxine (SYNTHROID) 112 MCG tablet Take 1 tablet (112 mcg total) by mouth daily before breakfast.  ? metoprolol succinate (TOPROL-XL) 50 MG 24 hr tablet TAKE 1 TABLET BY MOUTH  DAILY  ? predniSONE (DELTASONE) 1 MG tablet TAKE 1 TABLET BY MOUTH  DAILY  ? ? ?Allergies: ?Patient is allergic to shellfish-derived products, epinephrine, monosodium glutamate, ciprofloxacin, erythromycin, monosodium glutamate, sulfa antibiotics, sulfamethoxazole-trimethoprim, and tetracyclines & related. ?Family History: ?Patient family history includes Cirrhosis (age of onset: 39) in her father; Early death (age of onset: 77) in her mother; Hypertension in her brother; Lung cancer (age of onset: 42) in her brother; Scleroderma in her daughter. ?Social History:  ?Patient  reports that she has quit smoking. Her smoking use included cigarettes. She has never used smokeless tobacco. She reports that she does not currently use alcohol. She reports that she does not currently use drugs. ? ?Review of Systems: ?Constitutional: Negative for fever malaise or anorexia ?Cardiovascular: negative for chest pain ?Respiratory: negative for SOB or persistent cough ?Gastrointestinal: negative for abdominal pain ? ?Objective  ?Vitals: BP 130/80   Pulse 62   Temp 97.7 ?F (36.5 ?C)   Ht '5\' 4"'$  (1.626 m)   Wt 130 lb (59 kg) Comment: Pt refuse  and reported wt from home.  SpO2 95%   BMI 22.31 kg/m?  ?General: no acute distress , A&Ox3, in wheelchair ?Cardiovascular:  RRR ?Respiratory:  Good breath sounds bilaterally, CTAB with normal respiratory effort ?Skin:  Warm, no rashes ?Ankles with 1+ edema bilaterally ? ? ? ?Commons side effects, risks, benefits, and alternatives for medications and treatment plan prescribed today were discussed, and the patient expressed understanding of the given instructions. Patient is instructed to call or message via MyChart if he/she has any questions or concerns regarding our treatment plan. No barriers to understanding were identified. We discussed Red Flag symptoms and signs in detail. Patient expressed understanding regarding what to do in case of urgent or emergency type symptoms.  ?Medication list was reconciled, printed and provided to the patient in AVS. Patient instructions and summary information was reviewed with the patient as documented in the AVS. ?This note was prepared with assistance of Systems analyst. Occasional wrong-word or sound-a-like substitutions may have occurred due to the inherent limitations of voice recognition  software ? ?This visit occurred during the SARS-CoV-2 public health emergency.  Safety protocols were in place, including screening questions prior to the visit, additional usage of staff PPE, and extensive cleaning of exam room while observing appropriate contact time as indicated for disinfecting solutions.  ? ?

## 2021-12-27 NOTE — Patient Instructions (Signed)
Please return in 6 months for recheck. ? ?I will look into a geriatric specialist to help you.  ? ?If you have any questions or concerns, please don't hesitate to send me a message via MyChart or call the office at [phone]. Thank you for visiting with [place] today! It's our pleasure caring for you.  ?

## 2021-12-28 LAB — CBC WITH DIFFERENTIAL/PLATELET
Basophils Absolute: 0.1 10*3/uL (ref 0.0–0.1)
Basophils Relative: 1.6 % (ref 0.0–3.0)
Eosinophils Absolute: 0.1 10*3/uL (ref 0.0–0.7)
Eosinophils Relative: 1.4 % (ref 0.0–5.0)
HCT: 40.9 % (ref 36.0–46.0)
Hemoglobin: 13.6 g/dL (ref 12.0–15.0)
Lymphocytes Relative: 42.1 % (ref 12.0–46.0)
Lymphs Abs: 2.5 10*3/uL (ref 0.7–4.0)
MCHC: 33.2 g/dL (ref 30.0–36.0)
MCV: 97.1 fl (ref 78.0–100.0)
Monocytes Absolute: 0.5 10*3/uL (ref 0.1–1.0)
Monocytes Relative: 8.9 % (ref 3.0–12.0)
Neutro Abs: 2.8 10*3/uL (ref 1.4–7.7)
Neutrophils Relative %: 46 % (ref 43.0–77.0)
Platelets: 210 10*3/uL (ref 150.0–400.0)
RBC: 4.21 Mil/uL (ref 3.87–5.11)
RDW: 13.7 % (ref 11.5–15.5)
WBC: 6 10*3/uL (ref 4.0–10.5)

## 2021-12-28 LAB — COMPREHENSIVE METABOLIC PANEL
ALT: 29 U/L (ref 0–35)
AST: 35 U/L (ref 0–37)
Albumin: 4 g/dL (ref 3.5–5.2)
Alkaline Phosphatase: 50 U/L (ref 39–117)
BUN: 20 mg/dL (ref 6–23)
CO2: 30 mEq/L (ref 19–32)
Calcium: 10.8 mg/dL — ABNORMAL HIGH (ref 8.4–10.5)
Chloride: 100 mEq/L (ref 96–112)
Creatinine, Ser: 0.31 mg/dL — ABNORMAL LOW (ref 0.40–1.20)
GFR: 94.98 mL/min (ref >60.00)
Glucose, Bld: 84 mg/dL (ref 70–99)
Potassium: 4.6 mEq/L (ref 3.5–5.1)
Sodium: 137 mEq/L (ref 135–145)
Total Bilirubin: 0.7 mg/dL (ref 0.2–1.2)
Total Protein: 8 g/dL (ref 6.0–8.3)

## 2021-12-28 LAB — TSH: TSH: 0.44 u[IU]/mL (ref 0.35–5.50)

## 2022-01-03 NOTE — Telephone Encounter (Signed)
I have spoken with patient.  Patient states bill is coming from [facility].  States she spoke with [name] in regard and states this referral was a mistake.  Patient states she has not seen [facility].  I have advised patient to follow up with [facility]'s billing department.    Patient has asked for me to call her back with that number at a later time.    I will call patient back.

## 2022-01-09 ENCOUNTER — Ambulatory Visit (HOSPITAL_COMMUNITY): Payer: Medicare Other | Attending: Cardiovascular Disease

## 2022-01-09 DIAGNOSIS — I340 Nonrheumatic mitral (valve) insufficiency: Secondary | ICD-10-CM | POA: Insufficient documentation

## 2022-01-09 LAB — ECHOCARDIOGRAM COMPLETE
AR max vel: 1.44 cm2
AV Area VTI: 1.59 cm2
AV Area mean vel: 1.44 cm2
AV Mean grad: 7.3 mmHg
AV Peak grad: 13.4 mmHg
Ao pk vel: 1.83 m/s
Radius: 0.4 cm
S' Lateral: 2.4 cm

## 2022-01-10 ENCOUNTER — Telehealth: Payer: Self-pay | Admitting: Internal Medicine

## 2022-01-10 NOTE — Telephone Encounter (Signed)
Pt would like a callback regarding ECHO results. Please advsie

## 2022-01-11 ENCOUNTER — Encounter: Payer: Self-pay | Admitting: Family

## 2022-01-11 ENCOUNTER — Ambulatory Visit (INDEPENDENT_AMBULATORY_CARE_PROVIDER_SITE_OTHER): Payer: Medicare Other | Admitting: Family

## 2022-01-11 VITALS — BP 130/82 | HR 60 | Temp 96.6°F | Resp 16 | Ht 64.0 in | Wt 128.0 lb

## 2022-01-11 DIAGNOSIS — E7800 Pure hypercholesterolemia, unspecified: Secondary | ICD-10-CM | POA: Diagnosis not present

## 2022-01-11 DIAGNOSIS — I10 Essential (primary) hypertension: Secondary | ICD-10-CM

## 2022-01-11 DIAGNOSIS — E038 Other specified hypothyroidism: Secondary | ICD-10-CM

## 2022-01-11 DIAGNOSIS — Z7689 Persons encountering health services in other specified circumstances: Secondary | ICD-10-CM

## 2022-01-11 DIAGNOSIS — H6123 Impacted cerumen, bilateral: Secondary | ICD-10-CM

## 2022-01-11 DIAGNOSIS — I4819 Other persistent atrial fibrillation: Secondary | ICD-10-CM

## 2022-01-11 DIAGNOSIS — E063 Autoimmune thyroiditis: Secondary | ICD-10-CM

## 2022-01-11 DIAGNOSIS — H903 Sensorineural hearing loss, bilateral: Secondary | ICD-10-CM

## 2022-01-11 NOTE — Telephone Encounter (Signed)
Returned call to Pt.  Discussed echo results.  All questions answered.

## 2022-01-11 NOTE — Progress Notes (Signed)
Provider: Marlowe Sax FNP-C   Vedansh Kerstetter, Nelda Bucks, NP  [name], Nelda Bucks, NP as PCP - General (Family Medicine) Thompson Grayer, MD as PCP - Electrophysiology (Cardiology) Thompson Grayer, MD as Consulting Physician (Cardiology) Georgia Lopes as Physician Assistant (Orthopedic Surgery) Hermelinda Medicus, MD as Consulting Physician (Rheumatology) [name], MD (Family Medicine) Edythe Clarity, [facility] as Pharmacist (Pharmacist)  Extended Emergency Contact Information Primary Emergency Contact: Ken Caryl Mobile Phone: [phone] Relation: Spouse  Code Status:  Full Code  Goals of care: Advanced Directive information    01/11/2022   10:40 AM  Advanced Directives  Does Patient Have a Medical Advance Directive? Yes  Type of Advance Directive Living will  Does patient want to make changes to medical advance directive? No - Patient declined     Chief Complaint  Patient presents with   Establish Care    New Patient.    HPI:  Pt is a 88 y.o. female seen today for establish care here at [facility] for medical management of chronic diseases.Resides in [place].  Hypertension - no blood pressure log for evaluation though states has been normal.denies any headache,dizziness,vision changes,fatigue,chest tightness,palpitation,chest pain or shortness of breath.      AFib -on apixaban 2.5 mg twice daily for anticoagulation and Metroprolol 50 mg for heart rate control.  Reports rapid heart rate sometimes but has improved compared to when she was first diagnosed .But denies any chest pain or shortness of breath  Hypothyroidism - on synthroid   Polymyositis- States treatment was recommended but declined prednisone and metroxetrate.  Not sleeping at night.cries sometimes. declined trazodone.Husband states does not want any medication that controlled her mind.  Right knee pain - bone on bone states not a  candidate  surgery.  Hard of hearing -wears hearing aids   Health Maintenance :  Has had shingles infection which was worst painful.   Past Medical History:  Diagnosis Date   Cataract    Hypertension    Moderate aortic stenosis    Moderate mitral regurgitation    Persistent atrial fibrillation (HCC)    PMR (polymyalgia rheumatica) (HCC)    Second degree AV block    Sick sinus syndrome ([place])    Thyroid disease    Past Surgical History:  Procedure Laterality Date   ABDOMINAL HYSTERECTOMY     APPENDECTOMY     CESAREAN SECTION     x 3   CHOLECYSTECTOMY     PACEMAKER GENERATOR CHANGE  11/20/2018   [employer] Accolade MRI PPM implanted in [place] CYST EXCISION      Allergies  Allergen Reactions   Shellfish-Derived Products Shortness Of Breath and Other (See Comments)    Lips go numb   Epinephrine Other (See Comments) and Hypertension    "Makes me feel wired"   Monosodium Glutamate Other (See Comments)    other   Ciprofloxacin Other (See Comments)    Allergic reaction not recalled   Erythromycin Other (See Comments)    Allergic reaction not recalled   Monosodium Glutamate Other (See Comments) and Hypertension    "Makes me feel badly"   Sulfa Antibiotics Other (See Comments)    Allergic reaction not recalled   Sulfamethoxazole-Trimethoprim    Tetracyclines & Related Other (See Comments)    Allergic reaction not recalled    Allergies as of 01/11/2022       Reactions   Shellfish-derived Products  Shortness Of Breath, Other (See Comments)   Lips go numb   Epinephrine Other (See Comments), Hypertension   "Makes me feel wired"   Monosodium Glutamate Other (See Comments)   other   Ciprofloxacin Other (See Comments)   Allergic reaction not recalled   Erythromycin Other (See Comments)   Allergic reaction not recalled   Monosodium Glutamate Other (See Comments), Hypertension   "Makes me feel badly"   Sulfa Antibiotics Other (See  Comments)   Allergic reaction not recalled   Sulfamethoxazole-trimethoprim    Tetracyclines & Related Other (See Comments)   Allergic reaction not recalled        Medication List        Accurate as of January 11, 2022 11:59 PM. If you have any questions, ask your nurse or doctor.          STOP taking these medications    cephALEXin 500 MG capsule Commonly known as: KEFLEX Stopped by: Sandrea Hughs, NP       TAKE these medications    acetaminophen 500 MG tablet Commonly known as: TYLENOL Take 500 mg by mouth every 6 (six) hours as needed for mild pain or headache.   apixaban 2.5 MG Tabs tablet Commonly known as: ELIQUIS Take 1 tablet (2.5 mg total) by mouth 2 (two) times daily.   digoxin 0.125 MG tablet Commonly known as: LANOXIN TAKE 1 TABLET BY MOUTH  DAILY   dorzolamide-timolol 22.3-6.8 MG/ML ophthalmic solution Commonly known as: COSOPT Place 1 drop into both eyes 2 (two) times daily.   hydrochlorothiazide 25 MG tablet Commonly known as: HYDRODIURIL TAKE 1 TABLET BY MOUTH  DAILY   latanoprost 0.005 % ophthalmic solution Commonly known as: XALATAN Place 1 drop into both eyes at bedtime.   levothyroxine 112 MCG tablet Commonly known as: SYNTHROID Take 1 tablet (112 mcg total) by mouth daily before breakfast.   metoprolol succinate 50 MG 24 hr tablet Commonly known as: TOPROL-XL TAKE 1 TABLET BY MOUTH  DAILY   predniSONE 1 MG tablet Commonly known as: DELTASONE TAKE 1 TABLET BY MOUTH  DAILY   Vitamin D3 25 MCG (1000 UT) Caps Take 1 capsule by mouth daily.        Review of Systems  Constitutional:  Negative for appetite change, chills, fatigue, fever and unexpected weight change.  HENT:  Negative for congestion, dental problem, ear discharge, ear pain, facial swelling, hearing loss, nosebleeds, postnasal drip, rhinorrhea, sinus pressure, sinus pain, sneezing, sore throat, tinnitus and trouble swallowing.   Eyes:  Negative for pain, discharge,  redness, itching and visual disturbance.  Respiratory:  Negative for cough, chest tightness, shortness of breath and wheezing.   Cardiovascular:  Negative for chest pain, palpitations and leg swelling.  Gastrointestinal:  Negative for abdominal distention, abdominal pain, blood in stool, constipation, diarrhea, nausea and vomiting.  Endocrine: Negative for cold intolerance, heat intolerance, polydipsia, polyphagia and polyuria.  Genitourinary:  Negative for difficulty urinating, dysuria, flank pain, frequency and urgency.       Incontinent   Musculoskeletal:  Negative for arthralgias, back pain, gait problem, joint swelling, myalgias, neck pain and neck stiffness.       On wheelchair  Unsteady gait has been working with PT x 4 times per week  Walks with a walker within the apartment with gait belt.   Skin:  Negative for color change, pallor, rash and wound.  Neurological:  Negative for dizziness, syncope, speech difficulty, weakness, light-headedness, numbness and headaches.  Hematological:  Does not bruise/bleed  easily.  Psychiatric/Behavioral:  Negative for agitation, behavioral problems, confusion, hallucinations, self-injury, sleep disturbance and suicidal ideas. The patient is not nervous/anxious.        Husband states has good memory    Immunization History  Administered Date(s) Administered   Fluad Quad(high Dose 65+) 05/13/2020   Moderna Sars-Covid-2 Vaccination 08/12/2019, 09/09/2019, 06/14/2020   Pneumococcal Conjugate-13 04/28/2019   Tdap 05/12/2021, 09/04/2021   Pertinent  Health Maintenance Due  Topic Date Due   INFLUENZA VACCINE  03/13/2022   DEXA SCAN  Discontinued      09/04/2021    7:56 PM 09/27/2021    2:21 PM 11/14/2021   10:16 AM 12/27/2021    2:32 PM 01/11/2022   10:40 AM  Fall Risk  Falls in the past year?  1 1 1 1   Was there an injury with Fall?  1 1 0 0  Was there an injury with Fall? - Comments   broke vertebrae    Fall Risk Category Calculator  3 3 1 1    Fall Risk Category  High High Low Low  Patient Fall Risk Level Low fall risk High fall risk  High fall risk Low fall risk  Patient at Risk for Falls Due to   History of fall(s);Impaired balance/gait;Impaired mobility;Impaired vision History of fall(s) History of fall(s)  Fall risk Follow up   Falls prevention discussed Falls evaluation completed Falls evaluation completed;Education provided;Falls prevention discussed   Functional Status Survey:    Vitals:   01/11/22 1031 01/11/22 1151  BP: (!) 160/110 130/82  Pulse: 60   Resp: 16   Temp: (!) 96.6 F (35.9 C)   SpO2: 98%   Weight: 128 lb (58.1 kg)   Height: 5' 4"  (1.626 m)    Body mass index is 21.97 kg/m. Physical Exam Vitals reviewed.  Constitutional:      General: She is not in acute distress.    Appearance: Normal appearance. She is normal weight. She is not ill-appearing or diaphoretic.  HENT:     Head: Normocephalic.     Right Ear: There is impacted cerumen.     Left Ear: There is impacted cerumen.     Ears:     Comments: HOH did not bring her hearing Aids to visit     Nose: Nose normal. No congestion or rhinorrhea.     Mouth/Throat:     Mouth: Mucous membranes are moist.     Pharynx: Oropharynx is clear. No oropharyngeal exudate or posterior oropharyngeal erythema.  Eyes:     General: No scleral icterus.       Right eye: No discharge.        Left eye: No discharge.     Extraocular Movements: Extraocular movements intact.     Conjunctiva/sclera: Conjunctivae normal.     Pupils: Pupils are equal, round, and reactive to light.  Neck:     Vascular: No carotid bruit.  Cardiovascular:     Rate and Rhythm: Normal rate and regular rhythm.     Pulses: Normal pulses.     Heart sounds: Murmur heard.    No friction rub. No gallop.  Pulmonary:     Effort: Pulmonary effort is normal. No respiratory distress.     Breath sounds: Normal breath sounds. No wheezing, rhonchi or rales.  Chest:     Chest wall: No tenderness.   Abdominal:     General: Bowel sounds are normal. There is no distension.     Palpations: Abdomen is soft. There is no mass.  Tenderness: There is no abdominal tenderness. There is no right CVA tenderness, left CVA tenderness, guarding or rebound.  Musculoskeletal:        General: No swelling or tenderness. Normal range of motion.     Cervical back: Normal range of motion. No rigidity or tenderness.     Right knee: Swelling present. No effusion, erythema or ecchymosis. Normal range of motion. No tenderness.     Left knee: Normal.     Right lower leg: No edema.     Left lower leg: No edema.  Lymphadenopathy:     Cervical: No cervical adenopathy.  Skin:    General: Skin is warm and dry.     Coloration: Skin is not pale.     Findings: No bruising, erythema, lesion or rash.  Neurological:     Mental Status: She is alert and oriented to person, place, and time.     Cranial Nerves: No cranial nerve deficit.     Sensory: No sensory deficit.     Motor: No weakness.     Coordination: Coordination normal.     Gait: Gait abnormal.  Psychiatric:        Mood and Affect: Mood normal.        Speech: Speech normal.        Behavior: Behavior normal.        Thought Content: Thought content normal.        Judgment: Judgment normal.    Labs reviewed: Recent Labs    08/09/21 2142 09/04/21 1940 09/04/21 1953 12/27/21 1511  NA 136 132* 134* 137  K 3.7 3.9 3.8 4.6  CL 98 96* 96* 100  CO2 31 27  --  30  GLUCOSE 104* 107* 109* 84  BUN 20 20 24* 20  CREATININE 0.34* 0.36* 0.30* 0.31*  CALCIUM 10.2 10.3  --  10.8*   Recent Labs    05/12/21 1448 09/04/21 1940 12/27/21 1511  AST 65* 41 35  ALT 43 31 29  ALKPHOS 54 57 50  BILITOT 0.5 0.6 0.7  PROT 7.5 8.0 8.0  ALBUMIN 3.6 3.9 4.0   Recent Labs    08/09/21 2142 09/04/21 1940 09/04/21 1953 12/27/21 1511  WBC 7.2 5.8  --  6.0  NEUTROABS 3.8  --   --  2.8  HGB 13.8 14.6 15.3* 13.6  HCT 42.1 42.9 45.0 40.9  MCV 95.2 96.2  --   97.1  PLT 224 206  --  210.0   Lab Results  Component Value Date   TSH 0.44 12/27/2021   Lab Results  Component Value Date   HGBA1C 5.8 (H) 07/28/2020   Lab Results  Component Value Date   CHOL 202 (H) 07/28/2020   HDL 66 07/28/2020   LDLCALC 117 (H) 07/28/2020   TRIG 87 07/28/2020   CHOLHDL 3.1 07/28/2020    Significant Diagnostic Results in last 30 days:  ECHOCARDIOGRAM COMPLETE  Result Date: 01/09/2022    ECHOCARDIOGRAM REPORT   Patient Name:   Deanna Rivera Date of Exam: 01/09/2022 Medical Rec #:  [id number]      Height:       64.0 in Accession #:    [id number]     Weight:       130.0 lb Date of Birth:  08/01/1933      BSA:          1.629 m Patient Age:    88 years       BP:  130/80 mmHg Patient Gender: F              HR:           68 bpm. Exam Location:  [address] Procedure: 2D Echo, Cardiac Doppler and Color Doppler Indications:    I34 Mitral regugitation  History:        Patient has prior history of Echocardiogram examinations, most                 recent 04/14/2020. CHF, Pacemaker, Arrythmias:Atrial Fibrillation                 and Sick sinus syndrome; Risk Factors:Hypertension, Dyslipidemia                 and Former Smoker.  Sonographer:    Basilia Jumbo BS, RDCS Referring Phys: [place]  1. Left ventricular ejection fraction, by estimation, is 60 to 65%. The left ventricle has normal function. The left ventricle has no regional wall motion abnormalities. Left ventricular diastolic function could not be evaluated.  2. Right ventricular systolic function is normal. The right ventricular size is normal. There is moderately elevated pulmonary artery systolic pressure.  3. Left atrial size was severely dilated.  4. Right atrial size was severely dilated.  5. The mitral valve is normal in structure. Mild mitral valve regurgitation. No evidence of mitral stenosis.  6. Tricuspid valve regurgitation is moderate.  7. The aortic valve is tricuspid. There is mild  calcification of the aortic valve. There is mild thickening of the aortic valve. Aortic valve regurgitation is trivial. Aortic valve sclerosis is present, with no evidence of aortic valve stenosis. Aortic valve area, by VTI measures 1.59 cm. Aortic valve mean gradient measures 7.3 mmHg. Aortic valve Vmax measures 1.83 m/s.  8. The inferior vena cava is normal in size with <50% respiratory variability, suggesting right atrial pressure of 8 mmHg. Comparison(s): Report only from [facility] 04/14/20 EF 55-60%. Mild MR. FINDINGS  Left Ventricle: Left ventricular ejection fraction, by estimation, is 60 to 65%. The left ventricle has normal function. The left ventricle has no regional wall motion abnormalities. The left ventricular internal cavity size was normal in size. There is  no left ventricular hypertrophy. Left ventricular diastolic function could not be evaluated due to atrial fibrillation. Left ventricular diastolic function could not be evaluated. Right Ventricle: The right ventricular size is normal. No increase in right ventricular wall thickness. Right ventricular systolic function is normal. There is moderately elevated pulmonary artery systolic pressure. The tricuspid regurgitant velocity is 3.13 m/s, and with an assumed right atrial pressure of 8 mmHg, the estimated right ventricular systolic pressure is 00.7 mmHg. Left Atrium: Left atrial size was severely dilated. Right Atrium: Right atrial size was severely dilated. Pericardium: There is no evidence of pericardial effusion. Mitral Valve: The mitral valve is normal in structure. Mild mitral valve regurgitation. No evidence of mitral valve stenosis. Tricuspid Valve: The tricuspid valve is normal in structure. Tricuspid valve regurgitation is moderate . No evidence of tricuspid stenosis. Aortic Valve: The aortic valve is tricuspid. There is mild calcification of the aortic valve. There is mild thickening of the aortic valve. Aortic valve regurgitation  is trivial. Aortic valve sclerosis is present, with no evidence of aortic valve stenosis. Aortic valve mean gradient measures 7.3 mmHg. Aortic valve peak gradient measures 13.4 mmHg. Aortic valve area, by VTI measures 1.59 cm. Pulmonic Valve: The pulmonic valve was normal in structure. Pulmonic valve regurgitation is  trivial. No evidence of pulmonic stenosis. Aorta: The aortic root is normal in size and structure. Venous: The inferior vena cava is normal in size with less than 50% respiratory variability, suggesting right atrial pressure of 8 mmHg. IAS/Shunts: No atrial level shunt detected by color flow Doppler.  LEFT VENTRICLE PLAX 2D LVIDd:         4.10 cm LVIDs:         2.40 cm LV PW:         1.00 cm LV IVS:        0.80 cm LVOT diam:     2.20 cm LV SV:         60 LV SV Index:   37 LVOT Area:     3.80 cm  RIGHT VENTRICLE            IVC RV Basal diam:  3.70 cm    IVC diam: 1.70 cm TAPSE (M-mode): 2.3 cm RVSP:           42.2 mmHg LEFT ATRIUM             Index        RIGHT ATRIUM           Index LA diam:        3.90 cm 2.39 cm/m   RA Pressure: 3.00 mmHg LA Vol (A2C):   99.9 ml 61.32 ml/m  RA Area:     23.90 cm LA Vol (A4C):   71.6 ml 43.95 ml/m  RA Volume:   75.70 ml  46.47 ml/m LA Biplane Vol: 88.5 ml 54.33 ml/m  AORTIC VALVE AV Area (Vmax):    1.44 cm AV Area (Vmean):   1.44 cm AV Area (VTI):     1.59 cm AV Vmax:           183.33 cm/s AV Vmean:          124.333 cm/s AV VTI:            0.375 m AV Peak Grad:      13.4 mmHg AV Mean Grad:      7.3 mmHg LVOT Vmax:         69.50 cm/s LVOT Vmean:        47.100 cm/s LVOT VTI:          0.157 m LVOT/AV VTI ratio: 0.42  AORTA Ao Root diam: 3.10 cm Ao Asc diam:  3.20 cm MR PISA:        1.01 cm  TRICUSPID VALVE MR PISA Radius: 0.40 cm   TR Peak grad:   39.2 mmHg                           TR Vmax:        313.00 cm/s                           Estimated RAP:  3.00 mmHg                           RVSP:           42.2 mmHg                            SHUNTS  Systemic VTI:  0.16 m                           Systemic Diam: 2.20 cm Skeet Latch MD Electronically signed by Skeet Latch MD Signature Date/Time: 01/09/2022/6:06:12 PM    Final     Assessment/Plan Problem List Items Addressed This Visit       Cardiovascular and Mediastinum   Persistent atrial fibrillation ([place])    - Reports intermittent palpitation at times but no chest pain -Continue on Metroprolol succinate, digoxin and apixaban       Benign essential hypertension    - Blood pressure elevated on arrival but rechecked is within normal range -Creatinine at baseline -Continue on hydrochlorothiazide and Metroprolol succinate -Continue to monitor blood pressure at home and notify provider if greater than 140/90 -On apixaban for anticoagulation -Not on statin due to advanced age and high risk for muscle weakness and falls         Relevant Orders   COMPLETE METABOLIC PANEL WITH GFR   CBC with Differential/Platelet     Endocrine   Hypothyroidism due to Hashimoto's thyroiditis, Rx Levothyroxine    Lab Results  Component Value Date   TSH 0.44 12/27/2021  -Continue on levothyroxine 112 mcg daily on empty stomach -will continue to monitor thyroid function      Relevant Orders   TSH     Nervous and Auditory   Sensorineural hearing loss (SNHL) of both ears    Left hearing aids at home today -Continue to follow-up with ENT       Relevant Orders   Ambulatory referral to ENT   Bilateral impacted cerumen    We will follow-up with the ENT for cerumen removal       Relevant Orders   Ambulatory referral to ENT     Other   Pure hypercholesterolemia    Not on statin  Latest LDL on chart 117  - Recommend a low saturated fats, low carbohydrates and high vegetable diet also exercise at least 3 times per week for 30 minutes.        Relevant Orders   Lipid panel   Lipid Panel   Encounter to establish care - Primary    Available medical records  reviewed, immunization up-to-date except Shingrix vaccine declined states had shingles infection.  Recommended scheduling for fasting blood work.         Family/ staff Communication: Reviewed plan of care with patient and Husband verbalized understanding   Labs/tests ordered:  - CBC with Differential/Platelet - CMP with eGFR([facility]) - TSH - Lipid panel  Next Appointment : Return in about 6 months (around 07/13/2022), or Fasting lipid in one week, for medical mangement of chronic issues., Fasting labs in 6 months prior to visit.   Sandrea Hughs, NP

## 2022-01-14 DIAGNOSIS — H903 Sensorineural hearing loss, bilateral: Secondary | ICD-10-CM | POA: Insufficient documentation

## 2022-01-14 DIAGNOSIS — H6123 Impacted cerumen, bilateral: Secondary | ICD-10-CM | POA: Insufficient documentation

## 2022-01-14 DIAGNOSIS — Z7689 Persons encountering health services in other specified circumstances: Secondary | ICD-10-CM | POA: Insufficient documentation

## 2022-01-14 NOTE — Assessment & Plan Note (Signed)
We will follow-up with the ENT for cerumen removal

## 2022-01-14 NOTE — Assessment & Plan Note (Signed)
Left hearing aids at home today -Continue to follow-up with ENT

## 2022-01-14 NOTE — Assessment & Plan Note (Signed)
Not on statin  Latest LDL on chart 117  - Recommend a low saturated fats, low carbohydrates and high vegetable diet also exercise at least 3 times per week for 30 minutes.

## 2022-01-14 NOTE — Assessment & Plan Note (Signed)
Lab Results  Component Value Date   TSH 0.44 12/27/2021  -Continue on levothyroxine 112 mcg daily on empty stomach -will continue to monitor thyroid function

## 2022-01-14 NOTE — Assessment & Plan Note (Signed)
-   Blood pressure elevated on arrival but rechecked is within normal range -Creatinine at baseline -Continue on hydrochlorothiazide and Metroprolol succinate -Continue to monitor blood pressure at home and notify provider if greater than 140/90 -On apixaban for anticoagulation -Not on statin due to advanced age and high risk for muscle weakness and falls

## 2022-01-14 NOTE — Assessment & Plan Note (Signed)
-   Reports intermittent palpitation at times but no chest pain -Continue on Metroprolol succinate, digoxin and apixaban

## 2022-01-14 NOTE — Assessment & Plan Note (Signed)
Available medical records reviewed, immunization up-to-date except Shingrix vaccine declined states had shingles infection.  Recommended scheduling for fasting blood work.

## 2022-01-16 ENCOUNTER — Telehealth: Payer: Self-pay | Admitting: Family

## 2022-01-16 ENCOUNTER — Telehealth: Payer: Self-pay | Admitting: Internal Medicine

## 2022-01-16 ENCOUNTER — Telehealth: Payer: Medicare Other

## 2022-01-16 NOTE — Telephone Encounter (Signed)
Patient states Deanna Rivera was referred to geriatric's for care.  States Deanna Rivera does not like the office.  Deanna Rivera is wanting to keep care with [name].  [name] is requesting an appt in order to get a referral to an ENT.  Please advise.

## 2022-01-16 NOTE — Telephone Encounter (Signed)
Patient scheduled.

## 2022-01-16 NOTE — Telephone Encounter (Signed)
Pt states she saw on MyChart she has a history of Chronic Heart Failure and would like to confirm this.  Spoke with pt and confirmed pt has a diagnosis of heart failure with preserved ejection fraction.  Reviewed echo results again from her 01/09/22 echo.  Pt states she takes medications as prescribed and is on a low salt diet.  Her therapist has ordered compression stockings for her LEE.  Pt states she is supposed to follow up with Dr Lovena Le in January 2024.  Pt advised to contact office with any further questions or concerns and thanked RN for the phone call.

## 2022-01-16 NOTE — Telephone Encounter (Signed)
Patient called wanting to know the answer to these two questions: 1.Patient wants to know what is chronic heart failure?  2. Does her last echo refect chronic heart failure?

## 2022-01-17 ENCOUNTER — Ambulatory Visit (INDEPENDENT_AMBULATORY_CARE_PROVIDER_SITE_OTHER): Payer: Medicare Other | Admitting: Family Medicine

## 2022-01-17 ENCOUNTER — Telehealth: Payer: Self-pay | Admitting: Family

## 2022-01-17 ENCOUNTER — Encounter: Payer: Self-pay | Admitting: Family Medicine

## 2022-01-17 VITALS — BP 138/80 | HR 58 | Temp 97.9°F | Ht 64.0 in

## 2022-01-17 DIAGNOSIS — R296 Repeated falls: Secondary | ICD-10-CM

## 2022-01-17 DIAGNOSIS — I10 Essential (primary) hypertension: Secondary | ICD-10-CM | POA: Diagnosis not present

## 2022-01-17 DIAGNOSIS — H6123 Impacted cerumen, bilateral: Secondary | ICD-10-CM

## 2022-01-17 DIAGNOSIS — I5032 Chronic diastolic (congestive) heart failure: Secondary | ICD-10-CM

## 2022-01-17 NOTE — Telephone Encounter (Signed)
Deanna Rivera,I've removed my name under PCP.she can cancelled the Otolaryngology when they call her to schedule appointment. Thank You.

## 2022-01-17 NOTE — Telephone Encounter (Signed)
Pt called to report she is going back to [name] for her PCP. Pt request cancel all future appointments with D. Honaker, NP. Pt also requested to cancel referral to otolaryngology.

## 2022-01-18 ENCOUNTER — Other Ambulatory Visit: Payer: Self-pay

## 2022-01-18 ENCOUNTER — Telehealth: Payer: Self-pay | Admitting: Pharmacist

## 2022-01-18 NOTE — Progress Notes (Signed)
Chronic Care Management Pharmacy Assistant   Name: Deanna Rivera  MRN: [id number] DOB: 1933/03/19   Reason for Encounter: Hypertension Adherence Call    Recent office visits:  01/17/2022 OV (PCP) Deanna Arnt, MD; no medication changes indicated.  12/27/2021 OV (PCP) Deanna Arnt, MD; no medication changes indicated.  Recent consult visits:  01/11/2022 OV (PSC) Ngetich, Dinah C, NP; no medication changes indicated.  Hospital visits:  None in previous 6 months  Medications: Outpatient Encounter Medications as of 01/18/2022  Medication Sig   acetaminophen (TYLENOL) 500 MG tablet Take 500 mg by mouth every 6 (six) hours as needed for mild pain or headache.   apixaban (ELIQUIS) 2.5 MG TABS tablet Take 1 tablet (2.5 mg total) by mouth 2 (two) times daily.   Cholecalciferol (VITAMIN D3) 25 MCG (1000 UT) CAPS Take 1 capsule by mouth daily.   digoxin (LANOXIN) 0.125 MG tablet TAKE 1 TABLET BY MOUTH  DAILY   dorzolamide-timolol (COSOPT) 22.3-6.8 MG/ML ophthalmic solution Place 1 drop into both eyes 2 (two) times daily.   hydrochlorothiazide (HYDRODIURIL) 25 MG tablet TAKE 1 TABLET BY MOUTH  DAILY   latanoprost (XALATAN) 0.005 % ophthalmic solution Place 1 drop into both eyes at bedtime.   levothyroxine (SYNTHROID) 112 MCG tablet Take 1 tablet (112 mcg total) by mouth daily before breakfast.   metoprolol succinate (TOPROL-XL) 50 MG 24 hr tablet TAKE 1 TABLET BY MOUTH  DAILY   predniSONE (DELTASONE) 1 MG tablet TAKE 1 TABLET BY MOUTH  DAILY   No facility-administered encounter medications on file as of 01/18/2022.   Reviewed chart prior to disease state call. Spoke with patient regarding BP  Recent Office Vitals: BP Readings from Last 3 Encounters:  01/17/22 138/80  01/11/22 130/82  12/27/21 130/80   Pulse Readings from Last 3 Encounters:  01/17/22 (!) 58  01/11/22 60  12/27/21 62    Wt Readings from Last 3 Encounters:  01/11/22 128 lb (58.1 kg)  12/27/21 130 lb (59 kg)   09/27/21 128 lb (58.1 kg)     Kidney Function Lab Results  Component Value Date/Time   CREATININE 0.31 (L) 12/27/2021 03:11 PM   CREATININE 0.30 (L) 09/04/2021 07:53 PM   CREATININE 0.34 (L) 07/28/2020 11:31 AM   CREATININE 0.35 (L) 03/08/2020 11:55 AM   GFR 94.98 12/27/2021 03:11 PM   GFRNONAA >60 09/04/2021 07:40 PM   GFRNONAA 100 07/28/2020 11:31 AM   GFRAA 116 07/28/2020 11:31 AM       Latest Ref Rng & Units 12/27/2021    3:11 PM 09/04/2021    7:53 PM 09/04/2021    7:40 PM  BMP  Glucose 70 - 99 mg/dL 84  109  107   BUN 6 - 23 mg/dL '20  24  20   '$ Creatinine 0.40 - 1.20 mg/dL 0.31  0.30  0.36   Sodium 135 - 145 mEq/L 137  134  132   Potassium 3.5 - 5.1 mEq/L 4.6  3.8  3.9   Chloride 96 - 112 mEq/L 100  96  96   CO2 19 - 32 mEq/L 30   27   Calcium 8.4 - 10.5 mg/dL 10.8   10.3     Current antihypertensive regimen:  Metoprolol Succinate 50 mg daily  How often are you checking your Blood Pressure? Patient states she checks a few times a week.  Current home BP readings: None to report. Patient states she can not remember her exact readings. However, she states they are "  normal."  What recent interventions/DTPs have been made by any provider to improve Blood Pressure control since last CPP Visit: No recent interventions or DTPs.  Any recent hospitalizations or ED visits since last visit with CPP? No  What diet changes have been made to improve Blood Pressure Control?  Patient states she is following a low salt diet.  What exercise is being done to improve your Blood Pressure Control?  Patient states she is doing physical therapy.  Adherence Review: Is the patient currently on ACE/ARB medication? No Does the patient have >5 day gap between last estimated fill dates? No  -Patient states she started seeing a provider who specializes in senior care. She has since decided to go back to Dr. Jonni Rivera instead.  Care Gaps: Medicare Annual Wellness: Completed 10/18/2021 Hemoglobin  A1C: 5.8% on 07/28/2020  Future Appointments  Date Time Provider [place]  02/07/2022  8:15 AM [facility] DEVICE REMOTES [facility]  03/08/2022  9:10 AM Pieter Partridge, DO [facility] None  05/09/2022  8:15 AM [facility] DEVICE REMOTES [facility]  08/08/2022  8:15 AM [facility] DEVICE REMOTES [facility]   Star Rating Drugs: None  April D Calhoun, [place] Pharmacist Assistant [phone]

## 2022-01-21 NOTE — Progress Notes (Signed)
Subjective  CC:  Chief Complaint  Patient presents with   Cerumen Impaction    Pt stated that both her ears are stopped up.    HPI: Deanna Rivera is a 88 y.o. female who presents to the office today to address the problems listed above in the chief complaint. Reviewed geriatric eval; pt dose not want to proceed with care there. Returns here. Questions answered.   Now feeling better and accepting her current limitiations: she is working with her husband at home and walking in the apt with a belt and his support. Otherwise in the wheelchair  C/o hearing loss due to ear wax. No pain.  Bp is ok.   Assessment  1. Bilateral impacted cerumen   2. Chronic heart failure with preserved ejection fraction ([place])   3. Benign essential hypertension   4. Frequent falls      Plan  Cleaned out ears with irrigation Conitnue all same meds Fall prevention.   Follow up: 3 mo for recheck  01/18/2022  No orders of the defined types were placed in this encounter.  No orders of the defined types were placed in this encounter.     I reviewed the patients updated PMH, FH, and SocHx.    Patient Active Problem List   Diagnosis Date Noted   Age-related osteoporosis with current pathological fracture 02/06/2021    Priority: High   SSS (sick sinus syndrome) ([place]) 02/11/2019    Priority: High   Status post placement of cardiac pacemaker 07/08/2018    Priority: High   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    Priority: High   Nontoxic multinodular goiter 09/08/2015    Priority: High   Current use of long term anticoagulation 05/16/2015    Priority: High   (HFpEF) heart failure with preserved ejection fraction ([place]) 09/13/2014    Priority: High   Pacemaker 11/09/2011    Priority: High   Impaired fasting glucose 05/23/2009    Priority: High   Long term current use of systemic steroids 03/30/2008    Priority: High   Polymyalgia rheumatica ([place]) 03/30/2008    Priority: High   Benign essential  hypertension 05/05/2007    Priority: High   Persistent atrial fibrillation ([place]) 11/06/2006    Priority: High   Pure hypercholesterolemia 11/06/2006    Priority: High   Hypothyroidism due to Hashimoto's thyroiditis, Rx Levothyroxine 06/25/2005    Priority: High   Primary osteoarthritis of right knee 10/23/2019    Priority: Medium    Nonrheumatic mitral valve regurgitation 09/16/2015    Priority: Medium    Recurrent UTI 03/14/2015    Priority: Medium    Neuralgia, postherpetic 07/02/2019    Priority: Low   OAB (overactive bladder) 07/02/2019    Priority: Low   Constipation 01/18/2019    Priority: Low   Encounter to establish care 01/14/2022   Sensorineural hearing loss (SNHL) of both ears 01/14/2022   Bilateral impacted cerumen 01/14/2022   Hashimoto's thyroiditis 06/15/2021   History of vitamin D deficiency 06/15/2021   Frequent falls 05/17/2021   Compression fracture of body of thoracic vertebra ([place]) 02/06/2021   Shellfish allergy 11/22/2015   Synovial cyst of popliteal space 11/22/2008   Current Meds  Medication Sig   acetaminophen (TYLENOL) 500 MG tablet Take 500 mg by mouth every 6 (six) hours as needed for mild pain or headache.   apixaban (ELIQUIS) 2.5 MG TABS tablet Take 1 tablet (2.5 mg total) by mouth 2 (two) times daily.   Cholecalciferol (VITAMIN D3)  25 MCG (1000 UT) CAPS Take 1 capsule by mouth daily.   digoxin (LANOXIN) 0.125 MG tablet TAKE 1 TABLET BY MOUTH  DAILY   dorzolamide-timolol (COSOPT) 22.3-6.8 MG/ML ophthalmic solution Place 1 drop into both eyes 2 (two) times daily.   hydrochlorothiazide (HYDRODIURIL) 25 MG tablet TAKE 1 TABLET BY MOUTH  DAILY   latanoprost (XALATAN) 0.005 % ophthalmic solution Place 1 drop into both eyes at bedtime.   levothyroxine (SYNTHROID) 112 MCG tablet Take 1 tablet (112 mcg total) by mouth daily before breakfast.   metoprolol succinate (TOPROL-XL) 50 MG 24 hr tablet TAKE 1 TABLET BY MOUTH  DAILY   predniSONE (DELTASONE) 1 MG  tablet TAKE 1 TABLET BY MOUTH  DAILY    Allergies: Patient is allergic to shellfish-derived products, epinephrine, monosodium glutamate, ciprofloxacin, erythromycin, monosodium glutamate, sulfa antibiotics, sulfamethoxazole-trimethoprim, and tetracyclines & related. Family History: Patient family history includes Cirrhosis (age of onset: 55) in her father; Early death (age of onset: 88) in her mother; Hypertension in her brother; Lung cancer (age of onset: 73) in her brother; Scleroderma in her daughter. Social History:  Patient  reports that she has quit smoking. Her smoking use included cigarettes. She has never used smokeless tobacco. She reports that she does not currently use alcohol. She reports that she does not currently use drugs.  Review of Systems: Constitutional: Negative for fever malaise or anorexia Cardiovascular: negative for chest pain Respiratory: negative for SOB or persistent cough Gastrointestinal: negative for abdominal pain  Objective  Vitals: BP 138/80   Pulse (!) 58   Temp 97.9 F (36.6 C)   Ht '5\' 4"'$  (1.626 m)   SpO2 96%   BMI 21.97 kg/m  General: no acute distress , A&Ox3 HEENT: [place]ERL, conjunctiva normal, neck is supple, tms obstructed with wax; after irrigation tms are nl Cardiovascular:  RRR without murmur or gallop.  Respiratory:  Good breath sounds bilaterally, CTAB with normal respiratory effort Skin:  Warm, no rashes    Commons side effects, risks, benefits, and alternatives for medications and treatment plan prescribed today were discussed, and the patient expressed understanding of the given instructions. Patient is instructed to call or message via MyChart if he/she has any questions or concerns regarding our treatment plan. No barriers to understanding were identified. We discussed Red Flag symptoms and signs in detail. Patient expressed understanding regarding what to do in case of urgent or emergency type symptoms.  Medication list was  reconciled, printed and provided to the patient in AVS. Patient instructions and summary information was reviewed with the patient as documented in the AVS. This note was prepared with assistance of Dragon voice recognition software. Occasional wrong-word or sound-a-like substitutions may have occurred due to the inherent limitations of voice recognition software  This visit occurred during the SARS-CoV-2 public health emergency.  Safety protocols were in place, including screening questions prior to the visit, additional usage of staff PPE, and extensive cleaning of exam room while observing appropriate contact time as indicated for disinfecting solutions.

## 2022-01-21 NOTE — Patient Instructions (Signed)
Please follow up as scheduled for your next visit with me: 05/30/2022   If you have any questions or concerns, please don't hesitate to send me a message via MyChart or call the office at [phone]. Thank you for visiting with [place] today! It's our pleasure caring for you.

## 2022-01-29 ENCOUNTER — Telehealth: Payer: Self-pay | Admitting: Family Medicine

## 2022-01-29 NOTE — Telephone Encounter (Signed)
FYI:    Patient is scheduled for tomorrow 6/20 for virtual visit.  Patient did not want to come into the office.  Patient states she has had symptoms for 5 days as of today.    Dr. Tamela Oddi schedule was full for today.  Patient only wanted to see [name] and did not want to schedule with any other providers.

## 2022-01-29 NOTE — Telephone Encounter (Signed)
Message sent thru MyChart to let pt know that if she has tested Positive for COVID she can just schedule a virtual visit with [name] to see if she could prescribe something.

## 2022-01-29 NOTE — Telephone Encounter (Signed)
Pt states at home covid-19 antigen test was positive 06/18. States has had fatigue since Thursday.  Pt states her lungs need to be listened to and wants to know if she should make an appointment with PCP.   Pt declined Virtual Visit.  FO Rep scheduled pt appointment for 06/20, "just in case".    Please advise is FO should follow up in a different manner.  Route to [employer], as needed

## 2022-01-30 ENCOUNTER — Encounter: Payer: Self-pay | Admitting: Family Medicine

## 2022-01-30 ENCOUNTER — Telehealth (INDEPENDENT_AMBULATORY_CARE_PROVIDER_SITE_OTHER): Payer: Medicare Other | Admitting: Family Medicine

## 2022-01-30 VITALS — Ht 64.0 in | Wt 129.0 lb

## 2022-01-30 DIAGNOSIS — U071 COVID-19: Secondary | ICD-10-CM

## 2022-01-30 NOTE — Progress Notes (Signed)
Virtual Visit via Video Note  Subjective  CC:  Chief Complaint  Patient presents with   Covid Positive    Pt tested COVID positive on 01/28/2022. Pt tested 2x for confirmation. Pt is still coughing but not a lot and very fatigue.      I connected with Yamilee Harmes on 01/30/22 at 11:00 AM EDT by a video enabled telemedicine application and verified that I am speaking with the correct person using two identifiers. Location patient: Home Location provider: [facility] [place], Office Persons participating in the virtual visit: [name], [name], MD Darlina Rumpf, [place] discussed the limitations of evaluation and management by telemedicine and the availability of in person appointments. The patient expressed understanding and agreed to proceed. HPI: Deanna Rivera is a 88 y.o. female who was contacted today to address the problems listed above in the chief complaint. 88 year old female presents due to 6-day history of URI symptoms consistent with COVID including subjective fevers, fatigue, congestion and hacking cough.  Fatigue is her worst symptoms.  Sleep is affected due to subjective fever and cold/hot chills.  Appetite is okay.  No shortness of breath.  No pleuritic chest pain.  No lightheadedness.  No abdominal pain.  She is on day 6 of symptoms.  She did not receive antivirals.  She is high risk.  She reports her breathing is fine.  She has questions about isolation.  She lives in an assisted living facility  Assessment  1. COVID-19 virus infection      Plan  COVID-19: Appears stable.  No respiratory distress.  She reports her cough and symptoms are improving.  Discussed supportive care.  Rest, hydration.  She is able to move about the facility wearing a mask at this time.  She will follow-up if things worsen.  I discussed the assessment and treatment plan with the patient. The patient was provided an opportunity to ask questions and all were  answered. The patient agreed with the plan and demonstrated an understanding of the instructions.   The patient was advised to call back or seek an in-person evaluation if the symptoms worsen or if the condition fails to improve as anticipated. Follow up: As scheduled 03/27/2022  No orders of the defined types were placed in this encounter.     I reviewed the patients updated PMH, FH, and SocHx.    Patient Active Problem List   Diagnosis Date Noted   Age-related osteoporosis with current pathological fracture 02/06/2021    Priority: High   SSS (sick sinus syndrome) ([place]) 02/11/2019    Priority: High   Status post placement of cardiac pacemaker 07/08/2018    Priority: High   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    Priority: High   Nontoxic multinodular goiter 09/08/2015    Priority: High   Current use of long term anticoagulation 05/16/2015    Priority: High   (HFpEF) heart failure with preserved ejection fraction ([place]) 09/13/2014    Priority: High   Pacemaker 11/09/2011    Priority: High   Impaired fasting glucose 05/23/2009    Priority: High   Long term current use of systemic steroids 03/30/2008    Priority: High   Polymyalgia rheumatica ([place]) 03/30/2008    Priority: High   Benign essential hypertension 05/05/2007    Priority: High   Persistent atrial fibrillation ([place]) 11/06/2006    Priority: High   Pure hypercholesterolemia 11/06/2006    Priority: High   Hypothyroidism due  to Hashimoto's thyroiditis, Rx Levothyroxine 06/25/2005    Priority: High   Primary osteoarthritis of right knee 10/23/2019    Priority: Medium    Nonrheumatic mitral valve regurgitation 09/16/2015    Priority: Medium    Recurrent UTI 03/14/2015    Priority: Medium    Neuralgia, postherpetic 07/02/2019    Priority: Low   OAB (overactive bladder) 07/02/2019    Priority: Low   Constipation 01/18/2019    Priority: Low   Encounter to establish care 01/14/2022   Sensorineural hearing loss (SNHL)  of both ears 01/14/2022   Bilateral impacted cerumen 01/14/2022   Hashimoto's thyroiditis 06/15/2021   History of vitamin D deficiency 06/15/2021   Frequent falls 05/17/2021   Compression fracture of body of thoracic vertebra ([place]) 02/06/2021   Shellfish allergy 11/22/2015   Synovial cyst of popliteal space 11/22/2008   Current Meds  Medication Sig   acetaminophen (TYLENOL) 500 MG tablet Take 500 mg by mouth every 6 (six) hours as needed for mild pain or headache.   apixaban (ELIQUIS) 2.5 MG TABS tablet Take 1 tablet (2.5 mg total) by mouth 2 (two) times daily.   Cholecalciferol (VITAMIN D3) 25 MCG (1000 UT) CAPS Take 1 capsule by mouth daily.   digoxin (LANOXIN) 0.125 MG tablet TAKE 1 TABLET BY MOUTH  DAILY   dorzolamide-timolol (COSOPT) 22.3-6.8 MG/ML ophthalmic solution Place 1 drop into both eyes 2 (two) times daily.   hydrochlorothiazide (HYDRODIURIL) 25 MG tablet TAKE 1 TABLET BY MOUTH  DAILY   latanoprost (XALATAN) 0.005 % ophthalmic solution Place 1 drop into both eyes at bedtime.   levothyroxine (SYNTHROID) 112 MCG tablet Take 1 tablet (112 mcg total) by mouth daily before breakfast.   metoprolol succinate (TOPROL-XL) 50 MG 24 hr tablet TAKE 1 TABLET BY MOUTH  DAILY   predniSONE (DELTASONE) 1 MG tablet TAKE 1 TABLET BY MOUTH  DAILY    Allergies: Patient is allergic to shellfish-derived products, epinephrine, monosodium glutamate, ciprofloxacin, erythromycin, monosodium glutamate, sulfa antibiotics, sulfamethoxazole-trimethoprim, and tetracyclines & related. Family History: Patient family history includes Cirrhosis (age of onset: 75) in her father; Early death (age of onset: 55) in her mother; Hypertension in her brother; Lung cancer (age of onset: 75) in her brother; Scleroderma in her daughter. Social History:  Patient  reports that she has quit smoking. Her smoking use included cigarettes. She has never used smokeless tobacco. She reports that she does not currently use  alcohol. She reports that she does not currently use drugs.  Review of Systems: Constitutional: Negative for fever malaise or anorexia Cardiovascular: negative for chest pain Respiratory: negative for SOB or persistent cough Gastrointestinal: negative for abdominal pain  OBJECTIVE Vitals: Ht '5\' 4"'$  (1.626 m)   Wt 129 lb (58.5 kg)   BMI 22.14 kg/m  General: no acute distress , A&Ox3, speaking clearly.  No respiratory distress.  No coughing. Normal mood and affect No tachypnea  [name], MD

## 2022-01-31 ENCOUNTER — Telehealth: Payer: Self-pay | Admitting: Pharmacist

## 2022-01-31 NOTE — Progress Notes (Signed)
Erroneous Encounter

## 2022-02-01 ENCOUNTER — Other Ambulatory Visit: Payer: Self-pay | Admitting: *Deleted

## 2022-02-01 DIAGNOSIS — I4819 Other persistent atrial fibrillation: Secondary | ICD-10-CM

## 2022-02-01 MED ORDER — APIXABAN 2.5 MG PO TABS: 2.5000 mg | ORAL_TABLET | Freq: Two times a day (BID) | ORAL | 2 refills | Status: DC

## 2022-02-01 NOTE — Telephone Encounter (Signed)
Eliquis 2.'5mg'$  refill request received. Patient is 88 years old, weight-58.5kg, Crea-0.31 on 12/27/2021, Diagnosis-Afib, and last seen by Dr. Lovena Le on 08/31/2021. Dose is appropriate based on dosing criteria. Will send in refill to requested pharmacy.

## 2022-02-07 ENCOUNTER — Ambulatory Visit (INDEPENDENT_AMBULATORY_CARE_PROVIDER_SITE_OTHER): Payer: Medicare Other

## 2022-02-07 DIAGNOSIS — I495 Sick sinus syndrome: Secondary | ICD-10-CM | POA: Diagnosis not present

## 2022-02-08 LAB — CUP PACEART REMOTE DEVICE CHECK
Battery Remaining Longevity: 150 mo
Battery Remaining Percentage: 100 %
Brady Statistic RA Percent Paced: 0 %
Brady Statistic RV Percent Paced: 76 %
Date Time Interrogation Session: 20230628064100
Implantable Lead Implant Date: 20130305
Implantable Lead Implant Date: 20130305
Implantable Lead Location: 753859
Implantable Lead Location: 753860
Implantable Lead Model: 4469
Implantable Lead Model: 4470
Implantable Lead Serial Number: 546229
Implantable Lead Serial Number: 712016
Implantable Pulse Generator Implant Date: 20200409
Lead Channel Impedance Value: 431 Ohm
Lead Channel Pacing Threshold Amplitude: 0.6 V
Lead Channel Pacing Threshold Pulse Width: 0.4 ms
Lead Channel Setting Pacing Amplitude: 1.1 V
Lead Channel Setting Pacing Pulse Width: 0.4 ms
Lead Channel Setting Sensing Sensitivity: 0.6 mV
Pulse Gen Serial Number: 859007

## 2022-02-12 ENCOUNTER — Emergency Department (HOSPITAL_BASED_OUTPATIENT_CLINIC_OR_DEPARTMENT_OTHER)
Admission: EM | Admit: 2022-02-12 | Discharge: 2022-02-12 | Disposition: A | Discharge status: Home / Self Care | Payer: Medicare Other | Attending: Emergency Medicine | Admitting: Emergency Medicine

## 2022-02-12 ENCOUNTER — Emergency Department (HOSPITAL_BASED_OUTPATIENT_CLINIC_OR_DEPARTMENT_OTHER): Payer: Medicare Other | Admitting: Radiology

## 2022-02-12 ENCOUNTER — Encounter (HOSPITAL_BASED_OUTPATIENT_CLINIC_OR_DEPARTMENT_OTHER): Payer: Self-pay

## 2022-02-12 ENCOUNTER — Other Ambulatory Visit: Payer: Self-pay

## 2022-02-12 DIAGNOSIS — W19XXXA Unspecified fall, initial encounter: Secondary | ICD-10-CM | POA: Diagnosis not present

## 2022-02-12 DIAGNOSIS — Z79899 Other long term (current) drug therapy: Secondary | ICD-10-CM | POA: Diagnosis not present

## 2022-02-12 DIAGNOSIS — S2341XA Sprain of ribs, initial encounter: Secondary | ICD-10-CM | POA: Diagnosis not present

## 2022-02-12 DIAGNOSIS — E039 Hypothyroidism, unspecified: Secondary | ICD-10-CM | POA: Insufficient documentation

## 2022-02-12 DIAGNOSIS — Z7901 Long term (current) use of anticoagulants: Secondary | ICD-10-CM | POA: Diagnosis not present

## 2022-02-12 DIAGNOSIS — S299XXA Unspecified injury of thorax, initial encounter: Secondary | ICD-10-CM | POA: Diagnosis present

## 2022-02-12 MED ORDER — OXYCODONE-ACETAMINOPHEN 5-325 MG PO TABS
1.0000 | ORAL_TABLET | Freq: Once | ORAL | Status: AC
  Administered 2022-02-12: 1 via ORAL
  Filled 2022-02-12: qty 1

## 2022-02-12 MED ORDER — OXYCODONE-ACETAMINOPHEN 5-325 MG PO TABS: 1.0000 | ORAL_TABLET | Freq: Four times a day (QID) | ORAL | 0 refills | Status: DC | PRN

## 2022-02-12 MED ORDER — METHOCARBAMOL 500 MG PO TABS: 500.0000 mg | ORAL_TABLET | Freq: Once | ORAL | Status: DC

## 2022-02-12 MED ORDER — METHOCARBAMOL 500 MG PO TABS: 500.0000 mg | ORAL_TABLET | Freq: Three times a day (TID) | ORAL | 0 refills | Status: DC | PRN

## 2022-02-12 NOTE — ED Notes (Signed)
Patient assisted to restroom via wheelchair. Patient states she feels unsteady on her feet, afraid of falling again. Moderate amount of assistance needed from staff.

## 2022-02-12 NOTE — Discharge Instructions (Addendum)
For pain control recommend taking Tylenol and the muscle relaxer as needed.  For breakthrough pain, take the Percocet as needed.  Note this can make you drowsy and should not be taken while driving or operating heavy machinery.  It also contains some Tylenol.  Please use the incentive spirometer as discussed.  If you are having worsening chest pain, any difficulty in breathing, fever, or other new concerning symptom, please return to the ER for reassessment.  Strongly recommend following up with your primary care doctor for recheck in the next couple days.

## 2022-02-12 NOTE — ED Provider Notes (Signed)
[facility] Provider Note   CSN: [id number] Arrival date & time: 02/12/22  1435     History  Chief Complaint  Patient presents with   Deanna Rivera is a 88 y.o. female.  Presented with department due to concern for fall.  Patient reports that she uses walker for mobility, fell today after losing her balance.  Landed on her right side, having pain to her right chest.  She denies any sort of ongoing shortness of breath though she did feel somewhat short of breath immediately after it happened.  She denies hitting her head.  She does take Eliquis.  Pain is worse with movement and improved with rest.  Has not taken any medicine for her pain yet today.  Fell around 1:30 PM this afternoon.  HPI     Home Medications Prior to Admission medications   Medication Sig Start Date End Date Taking? Authorizing Provider  methocarbamol (ROBAXIN) 500 MG tablet Take 1 tablet (500 mg total) by mouth every 8 (eight) hours as needed for muscle spasms. 02/12/22  Yes Lucrezia Starch, MD  oxyCODONE-acetaminophen (PERCOCET/ROXICET) 5-325 MG tablet Take 1 tablet by mouth every 6 (six) hours as needed for up to 3 days for severe pain. 02/12/22 02/15/22 Yes Pierrette Scheu, Ellwood Dense, MD  acetaminophen (TYLENOL) 500 MG tablet Take 500 mg by mouth every 6 (six) hours as needed for mild pain or headache.    [provider]  apixaban (ELIQUIS) 2.5 MG TABS tablet Take 1 tablet (2.5 mg total) by mouth 2 (two) times daily. 02/01/22   Evans Lance, MD  Cholecalciferol (VITAMIN D3) 25 MCG (1000 UT) CAPS Take 1 capsule by mouth daily.    [provider]  digoxin (LANOXIN) 0.125 MG tablet TAKE 1 TABLET BY MOUTH  DAILY 06/09/21   Allred, Jeneen Rinks, MD  dorzolamide-timolol (COSOPT) 22.3-6.8 MG/ML ophthalmic solution Place 1 drop into both eyes 2 (two) times daily. 03/30/21   [provider]  hydrochlorothiazide (HYDRODIURIL) 25 MG tablet TAKE 1 TABLET BY MOUTH  DAILY 10/18/21    [name], MD  latanoprost (XALATAN) 0.005 % ophthalmic solution Place 1 drop into both eyes at bedtime. 03/13/21   [provider]  levothyroxine (SYNTHROID) 112 MCG tablet Take 1 tablet (112 mcg total) by mouth daily before breakfast. 12/01/21   [name], MD  metoprolol succinate (TOPROL-XL) 50 MG 24 hr tablet TAKE 1 TABLET BY MOUTH  DAILY 06/27/21   [name], MD  predniSONE (DELTASONE) 1 MG tablet TAKE 1 TABLET BY MOUTH  DAILY 08/17/21   [name], MD      Allergies    Shellfish-derived products, Epinephrine, Monosodium glutamate, Ciprofloxacin, Erythromycin, Monosodium glutamate, Sulfa antibiotics, Sulfamethoxazole-trimethoprim, and Tetracyclines & related    Review of Systems   Review of Systems  Constitutional:  Negative for chills and fever.  HENT:  Negative for ear pain and sore throat.   Eyes:  Negative for pain and visual disturbance.  Respiratory:  Negative for cough and shortness of breath.   Cardiovascular:  Positive for chest pain. Negative for palpitations.  Gastrointestinal:  Negative for abdominal pain and vomiting.  Genitourinary:  Negative for dysuria and hematuria.  Musculoskeletal:  Negative for arthralgias and back pain.  Skin:  Negative for color change and rash.  Neurological:  Negative for seizures and syncope.  All other systems reviewed and are negative.   Physical Exam Updated Vital Signs BP (!) 185/82   Pulse 60  Temp 98.3 F (36.8 C)   Resp 16   Ht '5\' 4"'$  (1.626 m)   Wt 58.5 kg   SpO2 99%   BMI 22.14 kg/m  Physical Exam Vitals and nursing note reviewed.  Constitutional:      General: She is not in acute distress.    Appearance: She is well-developed.  HENT:     Head: Normocephalic and atraumatic.  Eyes:     Conjunctiva/sclera: Conjunctivae normal.  Cardiovascular:     Rate and Rhythm: Normal rate and regular rhythm.     Heart sounds: No murmur heard. Pulmonary:     Effort: Pulmonary effort is normal. No  respiratory distress.     Breath sounds: Normal breath sounds.  Chest:     Comments: Some tenderness to palpation to the right lateral chest wall Abdominal:     Palpations: Abdomen is soft.     Tenderness: There is no abdominal tenderness.  Musculoskeletal:        General: No swelling.     Cervical back: Neck supple.     Comments: Back: no C, T, L spine TTP, no step off or deformity RUE: no TTP throughout, no deformity, normal joint ROM, radial pulse intact, distal sensation and motor intact LUE: no TTP throughout, no deformity, normal joint ROM, radial pulse intact, distal sensation and motor intact RLE:  no TTP throughout, no deformity, normal joint ROM, distal pulse, sensation and motor intact LLE: no TTP throughout, no deformity, normal joint ROM, distal pulse, sensation and motor intact  Skin:    General: Skin is warm and dry.     Capillary Refill: Capillary refill takes less than 2 seconds.  Neurological:     Mental Status: She is alert.  Psychiatric:        Mood and Affect: Mood normal.     ED Results / Procedures / Treatments   Labs (all labs ordered are listed, but only abnormal results are displayed) Labs Reviewed - No data to display  EKG None  Radiology DG Ribs Unilateral W/Chest Right  Result Date: 02/12/2022 CLINICAL DATA:  Pain in lower right ribs after fall today. EXAM: RIGHT RIBS AND CHEST - 3+ VIEW COMPARISON:  Chest radiograph 09/04/2021. FINDINGS: No acute fracture or other bone lesions are seen involving the ribs. Old healed left upper posterior rib fractures. There is no evidence of pneumothorax or pleural effusion. Both lungs are clear. Heart size and mediastinal contours are within normal limits. Left chest wall pacemaker with leads overlying the right atrium and right ventricle. IMPRESSION: No acute osseous abnormality. Electronically Signed   By: Ileana Roup M.D.   On: 02/12/2022 15:50    Procedures Procedures    Medications Ordered in  ED Medications  methocarbamol (ROBAXIN) tablet 500 mg (500 mg Oral Not Given 02/12/22 2102)  oxyCODONE-acetaminophen (PERCOCET/ROXICET) 5-325 MG per tablet 1 tablet (1 tablet Oral Given 02/12/22 2105)    ED Course/ Medical Decision Making/ A&P                           Medical Decision Making Amount and/or Complexity of Data Reviewed Radiology: ordered.  Risk Prescription drug management.   88 year old lady with history of A-fib on Eliquis, hypothyroidism, polymyositis presenting for fall.  She reports a mechanical fall, no LOC or lightheadedness.  Landing on her right chest wall.  She hit does have reproducible tenderness to her lateral right chest wall.  Notably there is no midline back tenderness  and no tenderness to her right lower flank or abdomen at all.  She had endorsed some initial shortness of breath but she denies any sort of ongoing difficulty in breathing at this time.  Her respirations are even and unlabored, she has no hypoxia.  I independently reviewed and interpreted her CXR, no displaced acute rib fractures identified.  No pneumothorax or hemothorax.  Suspect rib strain/sprain.  Provided some pain medicine with improvement in symptoms.  Patient given incentive spirometer and instructed on use.  Given her overall clinical picture I feel she can be discharged and does not require admission at this time.  I advised following up closely with her primary care doctor for recheck in a couple days.  I had lengthy discussion regarding return precautions with patient and her husband at bedside should she develop uncontrolled pain and any difficulty in breathing or other new concerning symptoms.    After the discussed management above, the patient was determined to be safe for discharge.  The patient was in agreement with this plan and all questions regarding their care were answered.  ED return precautions were discussed and the patient will return to the ED with any significant worsening of  condition.         Final Clinical Impression(s) / ED Diagnoses Final diagnoses:  Sprain of costal cartilage, initial encounter    Rx / DC Orders ED Discharge Orders          Ordered    oxyCODONE-acetaminophen (PERCOCET/ROXICET) 5-325 MG tablet  Every 6 hours PRN        02/12/22 2114    methocarbamol (ROBAXIN) 500 MG tablet  Every 8 hours PRN        02/12/22 2115              Lucrezia Starch, MD 02/12/22 2120

## 2022-02-12 NOTE — ED Notes (Signed)
Reviewed AVS/discharge instruction with patient. Time allotted for and all questions answered. Patient is agreeable for d/c and escorted to ed exit by staff.  

## 2022-02-12 NOTE — ED Triage Notes (Signed)
Patient BIB [facility] from Home.  Endorses Utilizing Walker per Usual losing Balance today. Patient is unsure how she fell exactly but endorses Pain mainly to Right Lateral Torso. States Pain radiates to Mid Back.  No LOC. No Head Injury. Patient endorses being "winded" upon hitting the Ground. Takes Eliquis.    NAD Noted during Triage. A&Ox4. GCS 15. BIB Wheelchair/EMS Stretcher.

## 2022-02-14 ENCOUNTER — Telehealth: Payer: Self-pay | Admitting: Family Medicine

## 2022-02-14 NOTE — Telephone Encounter (Signed)
Called and lm for pt tcb. 

## 2022-02-14 NOTE — Telephone Encounter (Signed)
Pt will call back to schedule a 07/05 or 07/06 ED F/U with [facility] provi. when she calls back with transportation info.   Pt states she was prescribed percocet on 07/03 by ED provider but thought she was allergic to it. Please call asap to discuss.

## 2022-02-14 NOTE — Telephone Encounter (Signed)
See below

## 2022-02-15 ENCOUNTER — Encounter: Payer: Self-pay | Admitting: Family

## 2022-02-15 ENCOUNTER — Ambulatory Visit (INDEPENDENT_AMBULATORY_CARE_PROVIDER_SITE_OTHER): Payer: Medicare Other | Admitting: Family

## 2022-02-15 VITALS — BP 122/78 | HR 60 | Temp 98.0°F | Ht 64.0 in | Wt 128.0 lb

## 2022-02-15 DIAGNOSIS — R0781 Pleurodynia: Secondary | ICD-10-CM | POA: Diagnosis not present

## 2022-02-15 MED ORDER — OXYCODONE HCL 5 MG PO TABS: 2.5000 mg | ORAL_TABLET | Freq: Three times a day (TID) | ORAL | 0 refills | Status: DC | PRN

## 2022-02-15 NOTE — Progress Notes (Signed)
Subjective:     Patient ID: Deanna Rivera, female    DOB: 09/22/1932, 88 y.o.   MRN: [id number]  Chief Complaint  Patient presents with   Follow-up    Pt was seen in the ED on 7/3, she fell in closet and her body twisted and she hurt her ribs on right side. Pt states she is not sleeping because she's hurting. Mostly is hurting during the night and when she wakes up. She was given percocet and it is making her extremely nauseous So she is currently taking tylenol but feels as if she's taken to much.     HPI: Rib pain:  fell 3 days and went to ED, xrays negative for fx. Has hx of falls she states because she is so drowsy during day d/t not sleeping well during night. Given Trazodone by PCP but never started d/t concerns of am drowsiness. Denies SOB, using IS given by ER, has some pain with deep breathing.  Assessment & Plan:   Problem List Items Addressed This Visit   None Visit Diagnoses     Rib pain on right side    -  Primary d/t fall at home, seen in ER, no fx. Advised rib injuries can take a long time to heal. Advised using heat up to 73mn tid, trying OTC Lidocaine patches tid, avoiding activities involving lifting, pushing, or pulling more than 5lbs for next 2-3 weeks. Reports N,V with '5mg'$  Percocet in ER, did not eat first, reports taking 1/4 of '5mg'$  OXY last year with vertebral fx and tolerated fine. Sending refill,husb w/pt today, & advised both on safety precautions and to take only for severe pain or to help with sleep at night.    Relevant Medications   oxyCODONE (ROXICODONE) 5 MG immediate release tablet      Outpatient Medications Prior to Visit  Medication Sig Dispense Refill   acetaminophen (TYLENOL) 500 MG tablet Take 500 mg by mouth every 6 (six) hours as needed for mild pain or headache.     apixaban (ELIQUIS) 2.5 MG TABS tablet Take 1 tablet (2.5 mg total) by mouth 2 (two) times daily. 180 tablet 2   Cholecalciferol (VITAMIN D3) 25 MCG (1000 UT) CAPS Take 1 capsule  by mouth daily.     digoxin (LANOXIN) 0.125 MG tablet TAKE 1 TABLET BY MOUTH  DAILY 90 tablet 3   dorzolamide-timolol (COSOPT) 22.3-6.8 MG/ML ophthalmic solution Place 1 drop into both eyes 2 (two) times daily.     hydrochlorothiazide (HYDRODIURIL) 25 MG tablet TAKE 1 TABLET BY MOUTH  DAILY 90 tablet 3   latanoprost (XALATAN) 0.005 % ophthalmic solution Place 1 drop into both eyes at bedtime.     levothyroxine (SYNTHROID) 112 MCG tablet Take 1 tablet (112 mcg total) by mouth daily before breakfast. 90 tablet 2   metoprolol succinate (TOPROL-XL) 50 MG 24 hr tablet TAKE 1 TABLET BY MOUTH  DAILY 90 tablet 3   predniSONE (DELTASONE) 1 MG tablet TAKE 1 TABLET BY MOUTH  DAILY 90 tablet 3   methocarbamol (ROBAXIN) 500 MG tablet Take 1 tablet (500 mg total) by mouth every 8 (eight) hours as needed for muscle spasms. (Patient not taking: Reported on 02/15/2022) 20 tablet 0   oxyCODONE-acetaminophen (PERCOCET/ROXICET) 5-325 MG tablet Take 1 tablet by mouth every 6 (six) hours as needed for up to 3 days for severe pain. (Patient not taking: Reported on 02/15/2022) 12 tablet 0   No facility-administered medications prior to visit.    Past Medical  History:  Diagnosis Date   Cataract    Hypertension    Moderate aortic stenosis    Moderate mitral regurgitation    Persistent atrial fibrillation (HCC)    PMR (polymyalgia rheumatica) (HCC)    Second degree AV block    Sick sinus syndrome ([place])    Thyroid disease     Past Surgical History:  Procedure Laterality Date   ABDOMINAL HYSTERECTOMY     APPENDECTOMY     CESAREAN SECTION     x 3   CHOLECYSTECTOMY     PACEMAKER GENERATOR CHANGE  11/20/2018   [employer] Accolade MRI PPM implanted in Novelty CYST EXCISION      Allergies  Allergen Reactions   Shellfish-Derived Products Shortness Of Breath and Other (See Comments)    Lips go numb   Epinephrine Other (See Comments) and Hypertension    "Makes me feel  wired"   Monosodium Glutamate Other (See Comments)    other   Ciprofloxacin Other (See Comments)    Allergic reaction not recalled   Erythromycin Other (See Comments)    Allergic reaction not recalled   Monosodium Glutamate Other (See Comments) and Hypertension    "Makes me feel badly"   Sulfa Antibiotics Other (See Comments)    Allergic reaction not recalled   Sulfamethoxazole-Trimethoprim    Tetracyclines & Related Other (See Comments)    Allergic reaction not recalled       Objective:    Physical Exam Vitals and nursing note reviewed.  Constitutional:      Appearance: Normal appearance.  Cardiovascular:     Rate and Rhythm: Normal rate and regular rhythm.  Pulmonary:     Effort: Pulmonary effort is normal.     Breath sounds: Normal breath sounds.  Musculoskeletal:        General: Normal range of motion.  Skin:    General: Skin is warm and dry.  Neurological:     Mental Status: She is alert and oriented to person, place, and time.     Motor: Weakness (pt in wheelchair, uses walker at home) present.  Psychiatric:        Mood and Affect: Mood normal.        Behavior: Behavior normal.     BP 122/78 (BP Location: Right Arm, Patient Position: Sitting, Cuff Size: Normal)   Pulse 60   Temp 98 F (36.7 C) (Temporal)   Ht '5\' 4"'$  (1.626 m)   Wt 128 lb (58.1 kg)   SpO2 93%   BMI 21.97 kg/m  Wt Readings from Last 3 Encounters:  02/15/22 128 lb (58.1 kg)  02/12/22 128 lb 15.5 oz (58.5 kg)  01/30/22 129 lb (58.5 kg)        Meds ordered this encounter  Medications   oxyCODONE (ROXICODONE) 5 MG immediate release tablet    Sig: Take 0.5 tablets (2.5 mg total) by mouth 3 (three) times daily as needed for severe pain. AFTER EATING.    Dispense:  10 tablet    Refill:  0    Order Specific Question:   Supervising Provider    Answer:   ANDY, CAMILLE L [[id number]]    Jeanie Sewer, NP

## 2022-02-15 NOTE — Patient Instructions (Addendum)
It was very nice to see you today!   I am refilling your Oxycodone for your rib pain. Cut this in half or quarters and take up to 3 times per day as needed, alternating with 2 extra strength Tylenol 3 times per day. Be sure to eat something 15-36mnutes BEFORE taking the Oxycodone.  Try over the counter Lidocaine patches applied directly to where your pain is located on your right side.  When you are feeling better and no longer taking the Oxycodone you should discuss starting the Trazodone again with Dr AJonni Sangerso you can sleep and not be drowsy during the day causing you to fall!      PLEASE NOTE:  If you had any lab tests please let [facility] if you have not heard back within a few days. You may see your results on MyChart before we have a chance to review them but we will give you a call once they are reviewed by [facility] If we ordered any referrals today, please let [facility] if you have not heard from their office within the next week.

## 2022-02-27 NOTE — Progress Notes (Signed)
Remote pacemaker transmission.   

## 2022-02-28 ENCOUNTER — Telehealth: Payer: Self-pay | Admitting: Pharmacist

## 2022-02-28 NOTE — Progress Notes (Signed)
Chronic Care Management Pharmacy Assistant   Name: Deanna Rivera  MRN: [id number] DOB: Nov 06, 1932   Reason for Encounter: Hypertension Adherence Call    Recent office visits:  02/15/2022 [facility] (Family Medicine) Jeanie Sewer, NP;Try over the counter Lidocaine patches applied directly to where your pain is located on your right side  Recent consult visits:  None since last adherence call  Hospital visits:  02/12/2022 ED visit for Sprain of costale cartilage -Rx oxycodone 5-325 mg q6hrs prn pain -Rx methocarbamol 500 mg q8hrs prn  Medications: Outpatient Encounter Medications as of 02/28/2022  Medication Sig   acetaminophen (TYLENOL) 500 MG tablet Take 500 mg by mouth every 6 (six) hours as needed for mild pain or headache.   apixaban (ELIQUIS) 2.5 MG TABS tablet Take 1 tablet (2.5 mg total) by mouth 2 (two) times daily.   Cholecalciferol (VITAMIN D3) 25 MCG (1000 UT) CAPS Take 1 capsule by mouth daily.   digoxin (LANOXIN) 0.125 MG tablet TAKE 1 TABLET BY MOUTH  DAILY   dorzolamide-timolol (COSOPT) 22.3-6.8 MG/ML ophthalmic solution Place 1 drop into both eyes 2 (two) times daily.   hydrochlorothiazide (HYDRODIURIL) 25 MG tablet TAKE 1 TABLET BY MOUTH  DAILY   latanoprost (XALATAN) 0.005 % ophthalmic solution Place 1 drop into both eyes at bedtime.   levothyroxine (SYNTHROID) 112 MCG tablet Take 1 tablet (112 mcg total) by mouth daily before breakfast.   methocarbamol (ROBAXIN) 500 MG tablet Take 1 tablet (500 mg total) by mouth every 8 (eight) hours as needed for muscle spasms. (Patient not taking: Reported on 02/15/2022)   metoprolol succinate (TOPROL-XL) 50 MG 24 hr tablet TAKE 1 TABLET BY MOUTH  DAILY   oxyCODONE (ROXICODONE) 5 MG immediate release tablet Take 0.5 tablets (2.5 mg total) by mouth 3 (three) times daily as needed for severe pain. AFTER EATING.   predniSONE (DELTASONE) 1 MG tablet TAKE 1 TABLET BY MOUTH  DAILY   No facility-administered encounter medications on  file as of 02/28/2022.   Reviewed chart prior to disease state call. Spoke with patient regarding BP  Recent Office Vitals: BP Readings from Last 3 Encounters:  02/15/22 122/78  02/12/22 (!) 185/82  01/17/22 138/80   Pulse Readings from Last 3 Encounters:  02/15/22 60  02/12/22 60  01/17/22 (!) 58    Wt Readings from Last 3 Encounters:  02/15/22 128 lb (58.1 kg)  02/12/22 128 lb 15.5 oz (58.5 kg)  01/30/22 129 lb (58.5 kg)     Kidney Function Lab Results  Component Value Date/Time   CREATININE 0.31 (L) 12/27/2021 03:11 PM   CREATININE 0.30 (L) 09/04/2021 07:53 PM   CREATININE 0.34 (L) 07/28/2020 11:31 AM   CREATININE 0.35 (L) 03/08/2020 11:55 AM   GFR 94.98 12/27/2021 03:11 PM   GFRNONAA >60 09/04/2021 07:40 PM   GFRNONAA 100 07/28/2020 11:31 AM   GFRAA 116 07/28/2020 11:31 AM       Latest Ref Rng & Units 12/27/2021    3:11 PM 09/04/2021    7:53 PM 09/04/2021    7:40 PM  BMP  Glucose 70 - 99 mg/dL 84  109  107   BUN 6 - 23 mg/dL '20  24  20   '$ Creatinine 0.40 - 1.20 mg/dL 0.31  0.30  0.36   Sodium 135 - 145 mEq/L 137  134  132   Potassium 3.5 - 5.1 mEq/L 4.6  3.8  3.9   Chloride 96 - 112 mEq/L 100  96  96   CO2  19 - 32 mEq/L 30   27   Calcium 8.4 - 10.5 mg/dL 10.8   10.3     Current antihypertensive regimen:  Metoprolol succinate 50 mg  How often are you checking your Blood Pressure? weekly  Current home BP readings: 120's/70-80  What recent interventions/DTPs have been made by any provider to improve Blood Pressure control since last CPP Visit: No recent interventions or DTPs.  Any recent hospitalizations or ED visits since last visit with CPP? No  What diet changes have been made to improve Blood Pressure Control?  No recent diet changes, she does follow a low salt diet.  What exercise is being done to improve your Blood Pressure Control?  Patient is currently doing home physical therapy exercises.  Adherence Review: Is the patient currently on ACE/ARB  medication? No Does the patient have >5 day gap between last estimated fill dates? No   Care Gaps: Medicare Annual Wellness: Completed 10/18/2021 Hemoglobin A1C: 5.8% on 07/28/2020  Future Appointments  Date Time Provider [place]  03/08/2022  9:10 AM Pieter Partridge, DO [facility] None  03/27/2022 11:00 AM [facility] CCM PHARMACIST [facility]  05/09/2022  8:15 AM [facility] DEVICE REMOTES [facility]  05/30/2022  2:30 PM [name], MD [facility]  08/08/2022  8:15 AM [facility] DEVICE REMOTES [facility]   Star Rating Drugs: None  April D Calhoun, [place] Pharmacist Assistant [phone]

## 2022-03-08 ENCOUNTER — Ambulatory Visit: Payer: Medicare Other | Admitting: Neurology

## 2022-03-15 NOTE — Progress Notes (Deleted)
Chronic Care Management Pharmacy Note  08/25/2021 Name:  Deanna Rivera MRN:  [id number] DOB:  05/11/1933  Summary: Initial visit with PharmD.  Still not sleeping but has not tried trazodone.  She also mentions severely elevated Bps sometimes in the 191 systolic.  She is taking extra metoprolol? To treat this.  Was previously on lisinopril.  Was also recommended she be on some treatment for osteoporosis by endocrine but she has not started this at this time.  Previously elevated LDL with Afib - not on statin.  Recommendations/Changes made from today's visit: Monitor BP - will FU in two weeks after PCP appt. - consider resume lisinopril if elevated Try the trazodone to see if this helps with sleep Consider osteoporosis treatment considering fall/fracture risk  Plan: FU 4 months   Subjective: Deanna Rivera is an 88 y.o. year old female who is a primary patient of [name], MD.  The CCM team was consulted for assistance with disease management and care coordination needs.    Engaged with patient by telephone for initial visit in response to provider referral for pharmacy case management and/or care coordination services.   Consent to Services:  The patient was given the following information about Chronic Care Management services today, agreed to services, and gave verbal consent: 1. CCM service includes personalized support from designated clinical staff supervised by the primary care provider, including individualized plan of care and coordination with other care providers 2. 24/7 contact phone numbers for assistance for urgent and routine care needs. 3. Service will only be billed when office clinical staff spend 20 minutes or more in a month to coordinate care. 4. Only one practitioner may furnish and bill the service in a calendar month. 5.The patient may stop CCM services at any time (effective at the end of the month) by phone call to the office staff. 6. The patient will be  responsible for cost sharing (co-pay) of up to 20% of the service fee (after annual deductible is met). Patient agreed to services and consent obtained.  Patient Care Team: [name], MD as PCP - General (Family Medicine) Thompson Grayer, MD as PCP - Electrophysiology (Cardiology) Thompson Grayer, MD as Consulting Physician (Cardiology) Georgia Lopes as Physician Assistant (Orthopedic Surgery) Hermelinda Medicus, MD as Consulting Physician (Rheumatology) [name], MD (Family Medicine) Edythe Clarity, [facility] as Pharmacist (Pharmacist)  Recent office visits:  05/17/2021 OV (PCP) [name], MD; no medication changes indicated.   04/19/2021 OV Allwardt, Alyssa M, PA-C; no medication changes indicated.   03/08/2021 OV (PCP) [name], MD; Please stop calcitonin spray.   Recent consult visits:  08/16/2021 OV Gertie Fey) Daryel November, MD; metamucil daily, Loperamide PRN   07/05/2021 OV (Cardiology) Thompson Grayer, MD; On pradaxa Given advanced age, I have advised that she switch to eliquis 2.82m BID She is willing to switch but wishes to wait until after she uses up her current pradaxa supply.   06/15/2021 OV (Endocrinology) Shamleffer, IMelanie Crazier MD; Pt declines DXA scan - had vertigo with the last one , no medication changes indicated.   04/04/2021 OV (Neurosurgery) MLeisa LenzA, DO; no medication changes indicated.   Hospital visits:  08/09/2021 ED visit for Fall -No medication changes indicated.   05/12/2021 ED visit for Fall -She does have evidence of a nasal bone fracture. -No medication changes indicated.     Objective:  Lab Results  Component Value Date   CREATININE 0.34 (L) 08/09/2021   BUN  20 08/09/2021   GFR 189.54 10/27/2019   GFRNONAA >60 08/09/2021   GFRAA 116 07/28/2020   NA 136 08/09/2021   K 3.7 08/09/2021   CALCIUM 10.2 08/09/2021   CO2 31 08/09/2021   GLUCOSE 104 (H) 08/09/2021    Lab Results  Component  Value Date/Time   HGBA1C 5.8 (H) 07/28/2020 11:31 AM   HGBA1C 5.8 10/27/2019 10:10 AM   GFR 189.54 10/27/2019 10:10 AM   GFR 177.35 03/19/2019 01:48 PM    Last diabetic Eye exam: No results found for: HMDIABEYEEXA  Last diabetic Foot exam: No results found for: HMDIABFOOTEX   Lab Results  Component Value Date   CHOL 202 (H) 07/28/2020   HDL 66 07/28/2020   LDLCALC 117 (H) 07/28/2020   TRIG 87 07/28/2020   CHOLHDL 3.1 07/28/2020    Hepatic Function Latest Ref Rng & Units 05/12/2021 01/21/2021 07/28/2020  Total Protein 6.5 - 8.1 g/dL 7.5 7.6 7.5  Albumin 3.5 - 5.0 g/dL 3.6 3.4(L) -  AST 15 - 41 U/L 65(H) 44(H) 34  ALT 0 - 44 U/L 43 30 29  Alk Phosphatase 38 - 126 U/L 54 52 -  Total Bilirubin 0.3 - 1.2 mg/dL 0.5 1.0 0.5  Bilirubin, Direct 0.0 - 0.2 mg/dL - 0.3(H) -    Lab Results  Component Value Date/Time   TSH 1.21 06/15/2021 11:34 AM   TSH 1.85 07/28/2020 11:31 AM   FREET4 1.46 03/19/2019 01:48 PM    CBC Latest Ref Rng & Units 08/09/2021 05/12/2021 05/12/2021  WBC 4.0 - 10.5 K/uL 7.2 6.3 -  Hemoglobin 12.0 - 15.0 g/dL 13.8 13.5 14.3  Hematocrit 36.0 - 46.0 % 42.1 41.5 42.0  Platelets 150 - 400 K/uL 224 227 -    Lab Results  Component Value Date/Time   VD25OH 25.86 (L) 06/15/2021 11:34 AM   VD25OH 34.95 10/27/2019 10:10 AM    Clinical ASCVD: No  The ASCVD Risk score (Arnett DK, et al., 2019) failed to calculate for the following reasons:   The 2019 ASCVD risk score is only valid for ages 3 to 68    Depression screen PHQ 2/9 03/08/2021 07/28/2020 11/12/2019  Decreased Interest 3 0 0  Down, Depressed, Hopeless 3 0 0  PHQ - 2 Score 6 0 0  Altered sleeping 0 3 -  Tired, decreased energy 2 3 -  Change in appetite 0 0 -  Feeling bad or failure about yourself  3 0 -  Trouble concentrating 0 0 -  Moving slowly or fidgety/restless 0 0 -  Suicidal thoughts 0 0 -  PHQ-9 Score 11 6 -  Difficult doing work/chores Very difficult - -     Social History   Tobacco Use   Smoking Status Former   Types: Cigarettes  Smokeless Tobacco Never   BP Readings from Last 3 Encounters:  08/16/21 140/72  08/09/21 (!) 165/76  07/05/21 134/70   Pulse Readings from Last 3 Encounters:  08/16/21 60  08/09/21 71  07/05/21 (!) 2   Wt Readings from Last 3 Encounters:  08/09/21 132 lb (59.9 kg)  07/05/21 131 lb (59.4 kg)  06/15/21 132 lb (59.9 kg)   BMI Readings from Last 3 Encounters:  08/09/21 23.38 kg/m  07/05/21 22.49 kg/m  06/15/21 22.66 kg/m    Assessment/Interventions: Review of patient past medical history, allergies, medications, health status, including review of consultants reports, laboratory and other test data, was performed as part of comprehensive evaluation and provision of chronic care management services.  SDOH:  (Social Determinants of Health) assessments and interventions performed: Yes  Financial Resource Strain: Low Risk  (11/14/2021)   Overall Financial Resource Strain (CARDIA)    Difficulty of Paying Living Expenses: Not hard at all   Food Insecurity: No Food Insecurity (11/14/2021)   Hunger Vital Sign    Worried About Running Out of Food in the Last Year: Never true    Ran Out of Food in the Last Year: Never true    SDOH Screenings   Alcohol Screen: Not on file  Depression (PHQ2-9): Medium Risk   PHQ-2 Score: 11  Financial Resource Strain: Not on file  Food Insecurity: Not on file  Housing: Not on file  Physical Activity: Not on file  Social Connections: Not on file  Stress: Not on file  Tobacco Use: Medium Risk   Smoking Tobacco Use: Former   Smokeless Tobacco Use: Never   Passive Exposure: Not on file  Transportation Needs: Not on file    [place]  Allergies  Allergen Reactions   Shellfish-Derived Products Shortness Of Breath and Other (See Comments)    Lips go numb   Epinephrine Other (See Comments) and Hypertension    "Makes me feel wired"   Monosodium Glutamate Other (See Comments)    other    Ciprofloxacin Other (See Comments)    Allergic reaction not recalled   Erythromycin Other (See Comments)    Allergic reaction not recalled   Monosodium Glutamate Other (See Comments) and Hypertension    "Makes me feel badly"   Sulfa Antibiotics Other (See Comments)    Allergic reaction not recalled   Sulfamethoxazole-Trimethoprim    Tetracyclines & Related Other (See Comments)    Allergic reaction not recalled    Medications Reviewed Today     Reviewed by Daryel November, MD (Physician) on 08/21/21 at [phone]  Med List Status: <None>   Medication Order Taking? Sig Documenting Provider Last Dose Status Informant  acetaminophen (TYLENOL) 500 MG tablet [id number] Yes Take 500 mg by mouth every 6 (six) hours as needed for mild pain or headache. [provider] Taking Active Self  apixaban (ELIQUIS) 2.5 MG TABS tablet [id number] No Take 1 tablet (2.5 mg total) by mouth 2 (two) times daily.  Patient not taking: Reported on 08/16/2021   Thompson Grayer, MD Not Taking Active   Cholecalciferol (VITAMIN D3) 25 MCG (1000 UT) CAPS [id number] Yes Take 1 capsule by mouth daily. [provider] Taking Active   digoxin (LANOXIN) 0.125 MG tablet [id number] Yes TAKE 1 TABLET BY MOUTH  DAILY Allred, James, MD Taking Active   dorzolamide-timolol (COSOPT) 22.3-6.8 MG/ML ophthalmic solution [id number] Yes Place 1 drop into both eyes 2 (two) times daily. [provider] Taking Active Self  hydrochlorothiazide (HYDRODIURIL) 25 MG tablet [id number] Yes TAKE 1 TABLET BY MOUTH  DAILY  Patient taking differently: Take 12.5 mg by mouth daily.   [name], MD Taking Active   latanoprost (XALATAN) 0.005 % ophthalmic solution [id number] Yes Place 1 drop into both eyes at bedtime. [provider] Taking Active Self  levothyroxine (SYNTHROID) 112 MCG tablet [id number] Yes TAKE 1 TABLET BY MOUTH  DAILY  Patient taking differently: Take 112 mcg by mouth daily before breakfast.   [name], MD Taking Active   metoprolol succinate (TOPROL-XL) 50 MG 24 hr tablet [id number] Yes TAKE 1 TABLET BY MOUTH  DAILY [name], MD Taking Active   predniSONE (DELTASONE) 1 MG tablet [id number]  TAKE 1 TABLET  BY MOUTH  DAILY [name], MD  Active             Patient Active Problem List   Diagnosis Date Noted   Hashimoto's thyroiditis 06/15/2021   History of vitamin D deficiency 06/15/2021   Frequent falls 05/17/2021   Age-related osteoporosis with current pathological fracture 02/06/2021   Compression fracture of body of thoracic vertebra ([place]) 02/06/2021   Primary osteoarthritis of right knee 10/23/2019   Neuralgia, postherpetic 07/02/2019   OAB (overactive bladder) 07/02/2019   SSS (sick sinus syndrome) ([place]) 02/11/2019   Constipation 01/18/2019   Status post placement of cardiac pacemaker 07/08/2018   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   Shellfish allergy 11/22/2015   Nonrheumatic mitral valve regurgitation 09/16/2015   Nontoxic multinodular goiter 09/08/2015   Current use of long term anticoagulation 05/16/2015   Recurrent UTI 03/14/2015   (HFpEF) heart failure with preserved ejection fraction ([place]) 09/13/2014   Pacemaker 11/09/2011   Impaired fasting glucose 05/23/2009   Synovial cyst of popliteal space 11/22/2008   Long term current use of systemic steroids 03/30/2008   Polymyalgia rheumatica ([place]) 03/30/2008   Benign essential hypertension 05/05/2007   Persistent atrial fibrillation ([place]) 11/06/2006   Pure hypercholesterolemia 11/06/2006   Hypothyroidism due to Hashimoto's thyroiditis, Rx Levothyroxine 06/25/2005    Immunization History  Administered Date(s) Administered   Fluad Quad(high Dose 65+) 05/13/2020   Moderna Sars-Covid-2 Vaccination 08/12/2019, 09/09/2019, 06/14/2020   Pneumococcal Conjugate-13 04/28/2019   Tdap 05/12/2021    Conditions to be addressed/monitored:  HFpEF, HTN, Afib, Hypothyroidism, HLD, Osteoporosis  Patient Care  Plan: General Pharmacy (Adult)     Problem Identified: HFpEF, HTN, Afib, Hypothyroidism, HLD, Osteoporosis   Priority: High  Onset Date: 09/12/2021     Long-Range Goal: Patient-Specific Goal   Start Date: 09/12/2021  Expected End Date: 03/12/2022  This Visit's Progress: On track  Priority: High  Note:   Current Barriers:  Unable to maintain control of BP Does not adhere to prescribed medication regimen  Pharmacist Clinical Goal(s):  Patient will achieve improvement in BP as evidenced by monitoring adhere to prescribed medication regimen as evidenced by fill dates through collaboration with PharmD and provider.   Interventions: 1:1 collaboration with [name], MD regarding development and update of comprehensive plan of care as evidenced by provider attestation and co-signature Inter-disciplinary care team collaboration (see longitudinal plan of care) Comprehensive medication review performed; medication list updated in electronic medical record  Hypertension (BP goal <130/80) -Not ideally controlled -Current treatment: HCTZ 64m daily one half tablet daily Appropriate, Query effective -Medications previously tried: lisinopril?  -Current home readings: no logs today - does report occasional 2654Ysystolic -Denies hypotensive/hypertensive symptoms -Educated on BP goals and benefits of medications for prevention of heart attack, stroke and kidney damage; Exercise goal of 150 minutes per week; Importance of home blood pressure monitoring; Symptoms of hypotension and importance of maintaining adequate hydration; -Counseled to monitor BP at home daily x 2 weeks, document, and provide log at future appointments -Recommended to continue current medication Increase monitoring, she may need to reinitiate lisinopril if BP is really getting as high as she says it is.  Hyperlipidemia: (LDL goal < 70) -Uncontrolled -Current treatment: None -Medications previously tried: none noted   -Educated on Cholesterol goals;  Benefits of statin for ASCVD risk reduction; Importance of limiting foods high in cholesterol; -Recheck lipids at upcoming OV, Afib increased risk of stroke. LDL back in 2021 was elevated. Consider patient preference and upcoming labs to determine  if statin is necessary.  Atrial Fibrillation (Goal: prevent stroke and major bleeding) -Controlled -Current treatment: Rate control: Metoprolol, XL 89m daily Appropriate, Effective, Safe, Accessible Digosin 0.125mdaily Appropriate, Effective, Safe, Accessible Anticoagulation: Eliquis 2.5 mg BID Appropriate, Effective, Safe, Accessible -Counseled on increased risk of stroke due to Afib and benefits of anticoagulation for stroke prevention; importance of adherence to anticoagulant exactly as prescribed; -Recommended to continue current medication She mentions sometimes she takes extra Toprolol when BP is high. Counseled on effect on HR and increasing fall risk. Check BP at upcoming OV - may need additional BP control to avoid extra doses of other meds.  Heart Failure (Goal: manage symptoms and prevent exacerbations) -Controlled -Last ejection fraction: 55% -Current treatment: Metoprolol XL 5031maily Appropriate, Effective, Safe, Accessible -Medications previously tried: none noted  -Current home BP/HR readings: no logs today -Educated on Benefits of medications for managing symptoms and prolonging life Importance of weighing daily; if you gain more than 3 pounds in one day or 5 pounds in one week, contact providers Importance of blood pressure control -Recommended to continue current medication She does mention some slight swelling in feet and ankles, knows to contact us [place] it gets worse.  Osteoporosis (Goal Prevent falls/fractures) -Not ideally controlled -Last DEXA Scan: unknown - patient refused previously  -Patient is a candidate for pharmacologic treatment due to history of vertebral  fracture -Current treatment  None -Medications previously tried: none  -Recommend [phone] units of vitamin D daily. Recommend 1200 mg of calcium daily from dietary and supplemental sources. Recommend weight-bearing and muscle strengthening exercises for building and maintaining bone density. She was suggested by endo to take either Prolia, fosamax at last visit and was supposed to contact office.  She is a very high fall/fracture risk so something like this may be beneficial to her. -Recommend treat ment for osteoporosis pending patient preference.  Hypothyroidism (Goal: Maintain TSH) -Controlled -Current treatment  Levothyroxine 112m3maily Appropriate, Effective, Safe, Accessible -Medications previously tried: none noted -Takes appropriately  -Recommended to continue current medication  Patient Goals/Self-Care Activities Patient will:  - take medications as prescribed as evidenced by patient report and record review check blood pressure daily, document, and provide at future appointments  Follow Up Plan: The care management team will reach out to the patient again over the next 30 days.         Medication Assistance: None required.  Patient affirms current coverage meets needs.  Compliance/Adherence/Medication fill history: Care Gaps: DEXA - fu with PCP in two weeks Pneumonia vaccine  Star-Rating Drugs: N/A  Patient's preferred pharmacy is:  [facility] [id number]: [phone]: [phone] ([facility] [id number]: [phone]: [phone] ([facility] ) - [place] [address] [address]: [phone]: [phone] pill box? Yes Pt endorses  100% compliance  We discussed: Benefits of medication synchronization, packaging and delivery as well as enhanced pharmacist oversight with [facility]. Patient decided to: Continue current medication management strategy  Care Plan and Follow Up Patient Decision:  Patient agrees to Care Plan and Follow-up.  Plan: The care management team will reach out to the patient again over the next 30 days.  ChriBeverly Milch  PharmD Clinical Pharmacist  [place] [phone]    Current Barriers:  Unable to maintain control of BP Does not adhere to prescribed medication regimen  Pharmacist Clinical Goal(s):  Patient will achieve improvement in BP as evidenced by monitoring adhere to prescribed medication regimen as evidenced by fill dates through collaboration with PharmD and provider.   Interventions: 1:1 collaboration with [name], MD regarding development and update of comprehensive plan of care as evidenced by provider attestation and co-signature Inter-disciplinary care team collaboration (see longitudinal plan of care) Comprehensive medication review performed; medication list updated in electronic medical record  Hypertension (BP goal <130/80) -Not ideally controlled -Current treatment: HCTZ 16m daily one half tablet daily Appropriate, Query effective -Medications previously tried: lisinopril?  -Current home readings: no logs today - does report occasional 2709Gsystolic -Denies hypotensive/hypertensive symptoms -Educated on BP goals and benefits of medications for prevention of heart attack, stroke and kidney damage; Exercise goal of 150 minutes per week; Importance of home blood pressure monitoring; Symptoms of hypotension and importance of maintaining adequate hydration; -Counseled to monitor BP at home daily x 2 weeks, document, and provide log at future appointments -Recommended to continue current medication Increase monitoring, she may need to reinitiate  lisinopril if BP is really getting as high as she says it is.  Hyperlipidemia: (LDL goal < 70) -Uncontrolled -Current treatment: None -Medications previously tried: none noted  -Educated on Cholesterol goals;  Benefits of statin for ASCVD risk reduction; Importance of limiting foods high in cholesterol; -Recheck lipids at upcoming OV, Afib increased risk of stroke. LDL back in 2021 was elevated. Consider patient preference and upcoming labs to determine if statin is necessary.  Atrial Fibrillation (Goal: prevent stroke and major bleeding) -Controlled -Current treatment: Rate control: Metoprolol, XL 552mdaily Appropriate, Effective, Safe, Accessible Digosin 0.12537maily Appropriate, Effective, Safe, Accessible Anticoagulation: Eliquis 2.5 mg BID Appropriate, Effective, Safe, Accessible -Counseled on increased risk of stroke due to Afib and benefits of anticoagulation for stroke prevention; importance of adherence to anticoagulant exactly as prescribed; -Recommended to continue current medication She mentions sometimes she takes extra Toprolol when BP is high. Counseled on effect on HR and increasing fall risk. Check BP at upcoming OV - may need additional BP control to avoid extra doses of other meds.  Heart Failure (Goal: manage symptoms and prevent exacerbations) -Controlled -Last ejection fraction: 55% -Current treatment: Metoprolol XL 18m41mily Appropriate, Effective, Safe, Accessible -Medications previously tried: none noted  -Current home BP/HR readings: no logs today -Educated on Benefits of medications for managing symptoms and prolonging life Importance of weighing daily; if you gain more than 3 pounds in one day or 5 pounds in one week, contact providers Importance of blood pressure control -Recommended to continue current medication She does mention some slight swelling in feet and ankles, knows to contact us [place] gets worse.  Osteoporosis (Goal Prevent  falls/fractures) -Not ideally controlled -Last DEXA Scan: unknown - patient refused previously  -Patient is a candidate for pharmacologic treatment due to history of vertebral fracture -Current treatment  None -Medications previously tried: none  -Recommend [phone] units of vitamin D daily. Recommend 1200 mg of calcium daily from dietary and supplemental sources. Recommend weight-bearing and muscle strengthening exercises for building and maintaining bone density. She was suggested by endo to take either Prolia, fosamax at last visit and was supposed to contact office.  She is a very high fall/fracture risk so something like this may be beneficial to her. -Recommend  treat ment for osteoporosis pending patient preference.  Hypothyroidism (Goal: Maintain TSH) -Controlled -Current treatment  Levothyroxine 131mg daily Appropriate, Effective, Safe, Accessible -Medications previously tried: none noted -Takes appropriately  -Recommended to continue current medication  Patient Goals/Self-Care Activities Patient will:  - take medications as prescribed as evidenced by patient report and record review check blood pressure daily, document, and provide at future appointments  Follow Up Plan: The care management team will reach out to the patient again over the next 30 days.

## 2022-03-27 ENCOUNTER — Telehealth: Payer: Medicare Other

## 2022-03-30 ENCOUNTER — Telehealth: Payer: Self-pay | Admitting: Pharmacist

## 2022-03-30 NOTE — Progress Notes (Signed)
Chronic Care Management Pharmacy Assistant   Name: Deanna Rivera  MRN: [id number] DOB: [birth year]   Reason for Encounter: Hypertension Adherence Call    Recent office visits:  None since last adherence call  Recent consult visits:  None since last adherence call  Hospital visits:  None in previous 6 months  Medications: Outpatient Encounter Medications as of 03/30/2022  Medication Sig   acetaminophen (TYLENOL) 500 MG tablet Take 500 mg by mouth every 6 (six) hours as needed for mild pain or headache.   apixaban (ELIQUIS) 2.5 MG TABS tablet Take 1 tablet (2.5 mg total) by mouth 2 (two) times daily.   Cholecalciferol (VITAMIN D3) 25 MCG (1000 UT) CAPS Take 1 capsule by mouth daily.   digoxin (LANOXIN) 0.125 MG tablet TAKE 1 TABLET BY MOUTH  DAILY   dorzolamide-timolol (COSOPT) 22.3-6.8 MG/ML ophthalmic solution Place 1 drop into both eyes 2 (two) times daily.   hydrochlorothiazide (HYDRODIURIL) 25 MG tablet TAKE 1 TABLET BY MOUTH  DAILY   latanoprost (XALATAN) 0.005 % ophthalmic solution Place 1 drop into both eyes at bedtime.   levothyroxine (SYNTHROID) 112 MCG tablet Take 1 tablet (112 mcg total) by mouth daily before breakfast.   methocarbamol (ROBAXIN) 500 MG tablet Take 1 tablet (500 mg total) by mouth every 8 (eight) hours as needed for muscle spasms. (Patient not taking: Reported on 02/15/2022)   metoprolol succinate (TOPROL-XL) 50 MG 24 hr tablet TAKE 1 TABLET BY MOUTH  DAILY   oxyCODONE (ROXICODONE) 5 MG immediate release tablet Take 0.5 tablets (2.5 mg total) by mouth 3 (three) times daily as needed for severe pain. AFTER EATING.   predniSONE (DELTASONE) 1 MG tablet TAKE 1 TABLET BY MOUTH  DAILY   No facility-administered encounter medications on file as of 03/30/2022.   Reviewed chart prior to disease state call. Spoke with patient regarding BP  Recent Office Vitals: BP Readings from Last 3 Encounters:  02/15/22 122/78  02/12/22 (!) 185/82  01/17/22 138/80    Pulse Readings from Last 3 Encounters:  02/15/22 60  02/12/22 60  01/17/22 (!) 58    Wt Readings from Last 3 Encounters:  02/15/22 128 lb (58.1 kg)  02/12/22 128 lb 15.5 oz (58.5 kg)  01/30/22 129 lb (58.5 kg)     Kidney Function Lab Results  Component Value Date/Time   CREATININE 0.31 (L) 12/27/2021 03:11 PM   CREATININE 0.30 (L) 09/04/2021 07:53 PM   CREATININE 0.34 (L) 07/28/2020 11:31 AM   CREATININE 0.35 (L) 03/08/2020 11:55 AM   GFR 94.98 12/27/2021 03:11 PM   GFRNONAA >60 09/04/2021 07:40 PM   GFRNONAA 100 07/28/2020 11:31 AM   GFRAA 116 07/28/2020 11:31 AM       Latest Ref Rng & Units 12/27/2021    3:11 PM 09/04/2021    7:53 PM 09/04/2021    7:40 PM  BMP  Glucose 70 - 99 mg/dL 84  109  107   BUN 6 - 23 mg/dL '20  24  20   '$ Creatinine 0.40 - 1.20 mg/dL 0.31  0.30  0.36   Sodium 135 - 145 mEq/L 137  134  132   Potassium 3.5 - 5.1 mEq/L 4.6  3.8  3.9   Chloride 96 - 112 mEq/L 100  96  96   CO2 19 - 32 mEq/L 30   27   Calcium 8.4 - 10.5 mg/dL 10.8   10.3     Current antihypertensive regimen:  HCTZ 25 mg daily Metoprolol Succinate 50 mg  How often are you checking your Blood Pressure?   Current home BP readings:   What recent interventions/DTPs have been made by any provider to improve Blood Pressure control since last CPP Visit:   Any recent hospitalizations or ED visits since last visit with CPP?   What diet changes have been made to improve Blood Pressure Control?    What exercise is being done to improve your Blood Pressure Control?    Adherence Review: Is the patient currently on ACE/ARB medication?  Does the patient have >5 day gap between last estimated fill dates?   **Unsuccessful attempt at reaching patient to complete this call.**  Care Gaps: Medicare Annual Wellness: Completed 10/18/2021 Hemoglobin A1C: 5.8% on 07/28/2020  Future Appointments  Date Time Provider [place]  05/01/2022 11:00 AM [facility] CCM PHARMACIST [facility]   05/09/2022  8:15 AM [facility] DEVICE REMOTES [facility]  05/30/2022  2:30 PM [name], MD [facility]  08/08/2022  8:15 AM [facility] DEVICE REMOTES [facility]  08/08/2022 11:10 AM Pieter Partridge, DO [facility] None   Star Rating Drugs: None  April D Calhoun, [place] Pharmacist Assistant [phone]

## 2022-04-05 ENCOUNTER — Telehealth: Payer: Self-pay | Admitting: Family Medicine

## 2022-04-05 NOTE — Telephone Encounter (Signed)
Pa states: -Cannot lie down without pain. -experiencing pain around the anus/rectum -is not sure if it is the tail bone or within the rectum. -Voicemail message is acceptable.  Declined OV  Pt request  -referral for a specialty office for this complain.

## 2022-04-09 NOTE — Telephone Encounter (Signed)
Patient rescheduled appointment for 04/12/22 at 10:30 am

## 2022-04-09 NOTE — Telephone Encounter (Addendum)
Patient is scheduled for appointment on 04/11/22 at 2:30 pm

## 2022-04-11 ENCOUNTER — Ambulatory Visit: Payer: Medicare Other | Admitting: Family Medicine

## 2022-04-11 ENCOUNTER — Encounter: Payer: Self-pay | Admitting: *Deleted

## 2022-04-11 ENCOUNTER — Other Ambulatory Visit: Payer: Self-pay | Admitting: *Deleted

## 2022-04-11 NOTE — Patient Instructions (Signed)
Visit Information  Thank you for taking time to visit with me today. Please don't hesitate to contact me if I can be of assistance to you.   Following are the goals we discussed today:   Goals Addressed               This Visit's Progress     COMPLETED: No needs (pt-stated)        Care Coordination Interventions: Reviewed medications with patient and discussed purpose of all medications Reviewed scheduled/upcoming provider appointments including pending appointments and verified pt completed her AWV on 11/14/2021. Screening for signs and symptoms of depression related to chronic disease state  Assessed social determinant of health barriers          Please call the care guide team at [phone] if you need to cancel or reschedule your appointment.   If you are experiencing a Mental Health or [place] or need someone to talk to, please call the Suicide and Crisis Lifeline: [phone]  Patient verbalizes understanding of instructions and care plan provided today and agrees to view in [place]. Active MyChart status and patient understanding of how to access instructions and care plan via MyChart confirmed with patient.     No further follow up required: No needs  Raina Mina, RN Care Management Coordinator [place] Office [phone]

## 2022-04-11 NOTE — Patient Outreach (Signed)
  Care Coordination   Initial Visit Note   04/11/2022 Name: Deanna Rivera MRN: [id number] DOB: 08/12/1933  Deanna Rivera is a 88 y.o. year old female who sees Deanna Arnt, MD for primary care. I spoke with  Deanna Rivera by phone today.  What matters to the patients health and wellness today?  No needs    Goals Addressed               This Visit's Progress     COMPLETED: No needs (pt-stated)        Care Coordination Interventions: Reviewed medications with patient and discussed purpose of all medications Reviewed scheduled/upcoming provider appointments including pending appointments and verified pt completed her AWV on 11/14/2021. Screening for signs and symptoms of depression related to chronic disease state  Assessed social determinant of health barriers         SDOH assessments and interventions completed:  Yes  SDOH Interventions Today    Flowsheet Row Most Recent Value  SDOH Interventions   Food Insecurity Interventions Intervention Not Indicated  Housing Interventions Intervention Not Indicated  Transportation Interventions Intervention Not Indicated        Care Coordination Interventions Activated:  Yes  Care Coordination Interventions:  Yes, provided   Follow up plan: No further intervention required.   Encounter Outcome:  Pt. Visit Completed   Deanna Mina, RN Care Management Coordinator [place] Office [phone]

## 2022-04-12 ENCOUNTER — Ambulatory Visit (INDEPENDENT_AMBULATORY_CARE_PROVIDER_SITE_OTHER): Payer: Medicare Other | Admitting: Family Medicine

## 2022-04-12 ENCOUNTER — Encounter: Payer: Self-pay | Admitting: Family Medicine

## 2022-04-12 VITALS — BP 130/68 | HR 60 | Temp 97.7°F | Ht 64.0 in | Wt 130.0 lb

## 2022-04-12 DIAGNOSIS — K6289 Other specified diseases of anus and rectum: Secondary | ICD-10-CM | POA: Diagnosis not present

## 2022-04-12 DIAGNOSIS — K5909 Other constipation: Secondary | ICD-10-CM

## 2022-04-12 MED ORDER — NITROGLYCERIN 0.4 % RE OINT: TOPICAL_OINTMENT | RECTAL | 0 refills | Status: DC

## 2022-04-12 MED ORDER — HYDROCORTISONE ACETATE 25 MG RE SUPP: 25.0000 mg | Freq: Every evening | RECTAL | 0 refills | Status: AC

## 2022-04-12 NOTE — Patient Instructions (Addendum)
Please use the anusol HC (may need to buy over the counter) nightly for a week. You may also use the nitroglycerin ointment per rectum twice daily. This should also help with the pain.

## 2022-04-12 NOTE — Progress Notes (Signed)
Subjective  CC:  Chief Complaint  Patient presents with   Rectal Pain    Pt stated that she has been having some pain in her tailbone area for the past 3 weeks and it is getting worse. Would like a referral to be placed to address the problem.     HPI: Deanna Rivera is a 88 y.o. female who presents to the office today to address the problems listed above in the chief complaint. C/o pain while sitting and while getting out of bed in am. Vague historian. After extensive questioning and exam: seems like she is describing rectal pain, having intermittent hard to pass stools and straining w/ pain with some Bowel movements, pain upon wiping (wiping excessively at times due to soft messy stool), and pain with sitting. She denies back pain, falls, leg pain, blood in stool or abdominal pain. No h/o hemorrhoids. No rash.    Assessment  1. Rectal pain   2. Intermittent constipation      Plan  Rectal pain:  suspect fissure. Discussed etiology and treatment. Steroid supp and ntg ointment. Discussed mgt of constipation. F/u if not improving.  I spent a total of 32 minutes for this patient encounter. Time spent included preparation, face-to-face counseling with the patient and coordination of care, review of chart and records, and documentation of the encounter.  Follow up: as scheduled.   05/01/2022  No orders of the defined types were placed in this encounter.  Meds ordered this encounter  Medications   Nitroglycerin 0.4 % OINT    Sig: Apply 1 inch intra-anally twice a day for 2 weeks    Dispense:  30 g    Refill:  0   hydrocortisone (ANUSOL-HC) 25 MG suppository    Sig: Place 1 suppository (25 mg total) rectally at bedtime for 7 days. Then as needed    Dispense:  12 suppository    Refill:  0      I reviewed the patients updated PMH, FH, and SocHx.    Patient Active Problem List   Diagnosis Date Noted   Age-related osteoporosis with current pathological fracture 02/06/2021     Priority: High   SSS (sick sinus syndrome) ([place]) 02/11/2019    Priority: High   Status post placement of cardiac pacemaker 07/08/2018    Priority: High   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    Priority: High   Nontoxic multinodular goiter 09/08/2015    Priority: High   Current use of long term anticoagulation 05/16/2015    Priority: High   (HFpEF) heart failure with preserved ejection fraction ([place]) 09/13/2014    Priority: High   Pacemaker 11/09/2011    Priority: High   Impaired fasting glucose 05/23/2009    Priority: High   Long term current use of systemic steroids 03/30/2008    Priority: High   Polymyalgia rheumatica ([place]) 03/30/2008    Priority: High   Benign essential hypertension 05/05/2007    Priority: High   Persistent atrial fibrillation ([place]) 11/06/2006    Priority: High   Pure hypercholesterolemia 11/06/2006    Priority: High   Hypothyroidism due to Hashimoto's thyroiditis, Rx Levothyroxine 06/25/2005    Priority: High   Primary osteoarthritis of right knee 10/23/2019    Priority: Medium    Nonrheumatic mitral valve regurgitation 09/16/2015    Priority: Medium    Recurrent UTI 03/14/2015    Priority: Medium    Neuralgia, postherpetic 07/02/2019    Priority: Low   OAB (overactive bladder) 07/02/2019  Priority: Low   Constipation 01/18/2019    Priority: Low   Encounter to establish care 01/14/2022   Sensorineural hearing loss (SNHL) of both ears 01/14/2022   Bilateral impacted cerumen 01/14/2022   Hashimoto's thyroiditis 06/15/2021   History of vitamin D deficiency 06/15/2021   Frequent falls 05/17/2021   Compression fracture of body of thoracic vertebra (HCC) 02/06/2021   Shellfish allergy 11/22/2015   Synovial cyst of popliteal space 11/22/2008   Current Meds  Medication Sig   acetaminophen (TYLENOL) 500 MG tablet Take 500 mg by mouth every 6 (six) hours as needed for mild pain or headache.   apixaban (ELIQUIS) 2.5 MG TABS tablet Take 1 tablet (2.5 mg  total) by mouth 2 (two) times daily.   Cholecalciferol (VITAMIN D3) 25 MCG (1000 UT) CAPS Take 1 capsule by mouth daily.   digoxin (LANOXIN) 0.125 MG tablet TAKE 1 TABLET BY MOUTH  DAILY   dorzolamide-timolol (COSOPT) 22.3-6.8 MG/ML ophthalmic solution Place 1 drop into both eyes 2 (two) times daily.   hydrochlorothiazide (HYDRODIURIL) 25 MG tablet TAKE 1 TABLET BY MOUTH  DAILY   hydrocortisone (ANUSOL-HC) 25 MG suppository Place 1 suppository (25 mg total) rectally at bedtime for 7 days. Then as needed   latanoprost (XALATAN) 0.005 % ophthalmic solution Place 1 drop into both eyes at bedtime.   levothyroxine (SYNTHROID) 112 MCG tablet Take 1 tablet (112 mcg total) by mouth daily before breakfast.   methocarbamol (ROBAXIN) 500 MG tablet Take 1 tablet (500 mg total) by mouth every 8 (eight) hours as needed for muscle spasms.   metoprolol succinate (TOPROL-XL) 50 MG 24 hr tablet TAKE 1 TABLET BY MOUTH  DAILY   Nitroglycerin 0.4 % OINT Apply 1 inch intra-anally twice a day for 2 weeks   oxyCODONE (ROXICODONE) 5 MG immediate release tablet Take 0.5 tablets (2.5 mg total) by mouth 3 (three) times daily as needed for severe pain. AFTER EATING.   predniSONE (DELTASONE) 1 MG tablet TAKE 1 TABLET BY MOUTH  DAILY    Allergies: Patient is allergic to shellfish-derived products, epinephrine, monosodium glutamate, ciprofloxacin, erythromycin, monosodium glutamate, sulfa antibiotics, sulfamethoxazole-trimethoprim, and tetracyclines & related. Family History: Patient family history includes Cirrhosis (age of onset: 34) in her father; Early death (age of onset: 42) in her mother; Hypertension in her brother; Lung cancer (age of onset: 54) in her brother; Scleroderma in her daughter. Social History:  Patient  reports that she has quit smoking. Her smoking use included cigarettes. She has never used smokeless tobacco. She reports that she does not currently use alcohol. She reports that she does not currently use  drugs.  Review of Systems: Constitutional: Negative for fever malaise or anorexia Cardiovascular: negative for chest pain Respiratory: negative for SOB or persistent cough Gastrointestinal: negative for abdominal pain  Objective  Vitals: BP 130/68   Pulse 60   Temp 97.7 F (36.5 C)   Ht '5\' 4"'$  (1.626 m)   Wt 130 lb (59 kg)   SpO2 99%   BMI 22.31 kg/m  General: no acute distress , A&Ox3, in wheel chair, limited mobility Rectal exam: tender digital exam w/o mass and soft brown stool. Reproduces pain. No rash or hemorrhoid No ttp over low back, tailbone/coccyx, sciatic notch.     Commons side effects, risks, benefits, and alternatives for medications and treatment plan prescribed today were discussed, and the patient expressed understanding of the given instructions. Patient is instructed to call or message via MyChart if he/she has any questions or concerns regarding  our treatment plan. No barriers to understanding were identified. We discussed Red Flag symptoms and signs in detail. Patient expressed understanding regarding what to do in case of urgent or emergency type symptoms.  Medication list was reconciled, printed and provided to the patient in AVS. Patient instructions and summary information was reviewed with the patient as documented in the AVS. This note was prepared with assistance of Dragon voice recognition software. Occasional wrong-word or sound-a-like substitutions may have occurred due to the inherent limitations of voice recognition software  This visit occurred during the SARS-CoV-2 public health emergency.  Safety protocols were in place, including screening questions prior to the visit, additional usage of staff PPE, and extensive cleaning of exam room while observing appropriate contact time as indicated for disinfecting solutions.

## 2022-04-23 ENCOUNTER — Other Ambulatory Visit: Payer: Self-pay | Admitting: Internal Medicine

## 2022-04-23 NOTE — Progress Notes (Deleted)
Chronic Care Management Pharmacy Note  08/25/2021 Name:  Deanna Rivera MRN:  [id number] DOB:  [birth year]  Summary: Initial visit with PharmD.  Still not sleeping but has not tried trazodone.  She also mentions severely elevated Bps sometimes in the 301 systolic.  She is taking extra metoprolol? To treat this.  Was previously on lisinopril.  Was also recommended she be on some treatment for osteoporosis by endocrine but she has not started this at this time.  Previously elevated LDL with Afib - not on statin.  Recommendations/Changes made from today's visit: Monitor BP - will FU in two weeks after PCP appt. - consider resume lisinopril if elevated Try the trazodone to see if this helps with sleep Consider osteoporosis treatment considering fall/fracture risk  Plan: FU 4 months   Subjective: Deanna Rivera is an 88 y.o. year old female who is a primary patient of [name], MD.  The CCM team was consulted for assistance with disease management and care coordination needs.    Engaged with patient by telephone for initial visit in response to provider referral for pharmacy case management and/or care coordination services.   Consent to Services:  The patient was given the following information about Chronic Care Management services today, agreed to services, and gave verbal consent: 1. CCM service includes personalized support from designated clinical staff supervised by the primary care provider, including individualized plan of care and coordination with other care providers 2. 24/7 contact phone numbers for assistance for urgent and routine care needs. 3. Service will only be billed when office clinical staff spend 20 minutes or more in a month to coordinate care. 4. Only one practitioner may furnish and bill the service in a calendar month. 5.The patient may stop CCM services at any time (effective at the end of the month) by phone call to the office staff. 6. The patient will be  responsible for cost sharing (co-pay) of up to 20% of the service fee (after annual deductible is met). Patient agreed to services and consent obtained.  Patient Care Team: [name], MD as PCP - General (Family Medicine) Thompson Grayer, MD as PCP - Electrophysiology (Cardiology) Thompson Grayer, MD as Consulting Physician (Cardiology) Georgia Lopes as Physician Assistant (Orthopedic Surgery) Hermelinda Medicus, MD as Consulting Physician (Rheumatology) [name], MD (Family Medicine) Edythe Clarity, [facility] as Pharmacist (Pharmacist)  Recent office visits:  05/17/2021 OV (PCP) [name], MD; no medication changes indicated.   04/19/2021 OV Allwardt, Alyssa M, PA-C; no medication changes indicated.   03/08/2021 OV (PCP) [name], MD; Please stop calcitonin spray.   Recent consult visits:  08/16/2021 OV Gertie Fey) Daryel November, MD; metamucil daily, Loperamide PRN   07/05/2021 OV (Cardiology) Thompson Grayer, MD; On pradaxa Given advanced age, I have advised that she switch to eliquis 2.73m BID She is willing to switch but wishes to wait until after she uses up her current pradaxa supply.   06/15/2021 OV (Endocrinology) Shamleffer, IMelanie Crazier MD; Pt declines DXA scan - had vertigo with the last one , no medication changes indicated.   04/04/2021 OV (Neurosurgery) MLeisa LenzA, DO; no medication changes indicated.   Hospital visits:  08/09/2021 ED visit for Fall -No medication changes indicated.   05/12/2021 ED visit for Fall -She does have evidence of a nasal bone fracture. -No medication changes indicated.     Objective:  Lab Results  Component Value Date   CREATININE 0.34 (L) 08/09/2021   BUN  20 08/09/2021   GFR 189.54 10/27/2019   GFRNONAA >60 08/09/2021   GFRAA 116 07/28/2020   NA 136 08/09/2021   K 3.7 08/09/2021   CALCIUM 10.2 08/09/2021   CO2 31 08/09/2021   GLUCOSE 104 (H) 08/09/2021    Lab Results  Component  Value Date/Time   HGBA1C 5.8 (H) 07/28/2020 11:31 AM   HGBA1C 5.8 10/27/2019 10:10 AM   GFR 189.54 10/27/2019 10:10 AM   GFR 177.35 03/19/2019 01:48 PM    Last diabetic Eye exam: No results found for: HMDIABEYEEXA  Last diabetic Foot exam: No results found for: HMDIABFOOTEX   Lab Results  Component Value Date   CHOL 202 (H) 07/28/2020   HDL 66 07/28/2020   LDLCALC 117 (H) 07/28/2020   TRIG 87 07/28/2020   CHOLHDL 3.1 07/28/2020    Hepatic Function Latest Ref Rng & Units 05/12/2021 01/21/2021 07/28/2020  Total Protein 6.5 - 8.1 g/dL 7.5 7.6 7.5  Albumin 3.5 - 5.0 g/dL 3.6 3.4(L) -  AST 15 - 41 U/L 65(H) 44(H) 34  ALT 0 - 44 U/L 43 30 29  Alk Phosphatase 38 - 126 U/L 54 52 -  Total Bilirubin 0.3 - 1.2 mg/dL 0.5 1.0 0.5  Bilirubin, Direct 0.0 - 0.2 mg/dL - 0.3(H) -    Lab Results  Component Value Date/Time   TSH 1.21 06/15/2021 11:34 AM   TSH 1.85 07/28/2020 11:31 AM   FREET4 1.46 03/19/2019 01:48 PM    CBC Latest Ref Rng & Units 08/09/2021 05/12/2021 05/12/2021  WBC 4.0 - 10.5 K/uL 7.2 6.3 -  Hemoglobin 12.0 - 15.0 g/dL 13.8 13.5 14.3  Hematocrit 36.0 - 46.0 % 42.1 41.5 42.0  Platelets 150 - 400 K/uL 224 227 -    Lab Results  Component Value Date/Time   VD25OH 25.86 (L) 06/15/2021 11:34 AM   VD25OH 34.95 10/27/2019 10:10 AM    Clinical ASCVD: No  The ASCVD Risk score (Arnett DK, et al., 2019) failed to calculate for the following reasons:   The 2019 ASCVD risk score is only valid for ages 94 to 52    Depression screen PHQ 2/9 03/08/2021 07/28/2020 11/12/2019  Decreased Interest 3 0 0  Down, Depressed, Hopeless 3 0 0  PHQ - 2 Score 6 0 0  Altered sleeping 0 3 -  Tired, decreased energy 2 3 -  Change in appetite 0 0 -  Feeling bad or failure about yourself  3 0 -  Trouble concentrating 0 0 -  Moving slowly or fidgety/restless 0 0 -  Suicidal thoughts 0 0 -  PHQ-9 Score 11 6 -  Difficult doing work/chores Very difficult - -     Social History   Tobacco Use   Smoking Status Former   Types: Cigarettes  Smokeless Tobacco Never   BP Readings from Last 3 Encounters:  08/16/21 140/72  08/09/21 (!) 165/76  07/05/21 134/70   Pulse Readings from Last 3 Encounters:  08/16/21 60  08/09/21 71  07/05/21 (!) 2   Wt Readings from Last 3 Encounters:  08/09/21 132 lb (59.9 kg)  07/05/21 131 lb (59.4 kg)  06/15/21 132 lb (59.9 kg)   BMI Readings from Last 3 Encounters:  08/09/21 23.38 kg/m  07/05/21 22.49 kg/m  06/15/21 22.66 kg/m    Assessment/Interventions: Review of patient past medical history, allergies, medications, health status, including review of consultants reports, laboratory and other test data, was performed as part of comprehensive evaluation and provision of chronic care management services.  SDOH:  (Social Determinants of Health) assessments and interventions performed: Yes  Financial Resource Strain: Low Risk  (11/14/2021)   Overall Financial Resource Strain (CARDIA)    Difficulty of Paying Living Expenses: Not hard at all   Food Insecurity: No Food Insecurity (04/11/2022)   Hunger Vital Sign    Worried About Running Out of Food in the Last Year: Never true    Ran Out of Food in the Last Year: Never true    SDOH Screenings   Alcohol Screen: Not on file  Depression (PHQ2-9): Medium Risk   PHQ-2 Score: 11  Financial Resource Strain: Not on file  Food Insecurity: Not on file  Housing: Not on file  Physical Activity: Not on file  Social Connections: Not on file  Stress: Not on file  Tobacco Use: Medium Risk   Smoking Tobacco Use: Former   Smokeless Tobacco Use: Never   Passive Exposure: Not on file  Transportation Needs: Not on file    [place]  Allergies  Allergen Reactions   Shellfish-Derived Products Shortness Of Breath and Other (See Comments)    Lips go numb   Epinephrine Other (See Comments) and Hypertension    "Makes me feel wired"   Monosodium Glutamate Other (See Comments)    other    Ciprofloxacin Other (See Comments)    Allergic reaction not recalled   Erythromycin Other (See Comments)    Allergic reaction not recalled   Monosodium Glutamate Other (See Comments) and Hypertension    "Makes me feel badly"   Sulfa Antibiotics Other (See Comments)    Allergic reaction not recalled   Sulfamethoxazole-Trimethoprim    Tetracyclines & Related Other (See Comments)    Allergic reaction not recalled    Medications Reviewed Today     Reviewed by Daryel November, MD (Physician) on 08/21/21 at [phone]  Med List Status: <None>   Medication Order Taking? Sig Documenting Provider Last Dose Status Informant  acetaminophen (TYLENOL) 500 MG tablet [id number] Yes Take 500 mg by mouth every 6 (six) hours as needed for mild pain or headache. [provider] Taking Active Self  apixaban (ELIQUIS) 2.5 MG TABS tablet [id number] No Take 1 tablet (2.5 mg total) by mouth 2 (two) times daily.  Patient not taking: Reported on 08/16/2021   Thompson Grayer, MD Not Taking Active   Cholecalciferol (VITAMIN D3) 25 MCG (1000 UT) CAPS [id number] Yes Take 1 capsule by mouth daily. [provider] Taking Active   digoxin (LANOXIN) 0.125 MG tablet [id number] Yes TAKE 1 TABLET BY MOUTH  DAILY Allred, James, MD Taking Active   dorzolamide-timolol (COSOPT) 22.3-6.8 MG/ML ophthalmic solution [id number] Yes Place 1 drop into both eyes 2 (two) times daily. [provider] Taking Active Self  hydrochlorothiazide (HYDRODIURIL) 25 MG tablet [id number] Yes TAKE 1 TABLET BY MOUTH  DAILY  Patient taking differently: Take 12.5 mg by mouth daily.   [name], MD Taking Active   latanoprost (XALATAN) 0.005 % ophthalmic solution [id number] Yes Place 1 drop into both eyes at bedtime. [provider] Taking Active Self  levothyroxine (SYNTHROID) 112 MCG tablet [id number] Yes TAKE 1 TABLET BY MOUTH  DAILY  Patient taking differently: Take 112 mcg by mouth daily before breakfast.   [name], MD Taking Active   metoprolol succinate (TOPROL-XL) 50 MG 24 hr tablet [id number] Yes TAKE 1 TABLET BY MOUTH  DAILY [name], MD Taking Active   predniSONE (DELTASONE) 1 MG tablet [id number]  TAKE 1 TABLET  BY MOUTH  DAILY [name], MD  Active             Patient Active Problem List   Diagnosis Date Noted   Hashimoto's thyroiditis 06/15/2021   History of vitamin D deficiency 06/15/2021   Frequent falls 05/17/2021   Age-related osteoporosis with current pathological fracture 02/06/2021   Compression fracture of body of thoracic vertebra ([place]) 02/06/2021   Primary osteoarthritis of right knee 10/23/2019   Neuralgia, postherpetic 07/02/2019   OAB (overactive bladder) 07/02/2019   SSS (sick sinus syndrome) ([place]) 02/11/2019   Constipation 01/18/2019   Status post placement of cardiac pacemaker 07/08/2018   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   Shellfish allergy 11/22/2015   Nonrheumatic mitral valve regurgitation 09/16/2015   Nontoxic multinodular goiter 09/08/2015   Current use of long term anticoagulation 05/16/2015   Recurrent UTI 03/14/2015   (HFpEF) heart failure with preserved ejection fraction ([place]) 09/13/2014   Pacemaker 11/09/2011   Impaired fasting glucose 05/23/2009   Synovial cyst of popliteal space 11/22/2008   Long term current use of systemic steroids 03/30/2008   Polymyalgia rheumatica ([place]) 03/30/2008   Benign essential hypertension 05/05/2007   Persistent atrial fibrillation ([place]) 11/06/2006   Pure hypercholesterolemia 11/06/2006   Hypothyroidism due to Hashimoto's thyroiditis, Rx Levothyroxine 06/25/2005    Immunization History  Administered Date(s) Administered   Fluad Quad(high Dose 65+) 05/13/2020   Moderna Sars-Covid-2 Vaccination 08/12/2019, 09/09/2019, 06/14/2020   Pneumococcal Conjugate-13 04/28/2019   Tdap 05/12/2021    Conditions to be addressed/monitored:  HFpEF, HTN, Afib, Hypothyroidism, HLD, Osteoporosis  Patient Care  Plan: General Pharmacy (Adult)     Problem Identified: HFpEF, HTN, Afib, Hypothyroidism, HLD, Osteoporosis   Priority: High  Onset Date: 09/12/2021     Long-Range Goal: Patient-Specific Goal   Start Date: 09/12/2021  Expected End Date: 03/12/2022  This Visit's Progress: On track  Priority: High  Note:   Current Barriers:  Unable to maintain control of BP Does not adhere to prescribed medication regimen  Pharmacist Clinical Goal(s):  Patient will achieve improvement in BP as evidenced by monitoring adhere to prescribed medication regimen as evidenced by fill dates through collaboration with PharmD and provider.   Interventions: 1:1 collaboration with [name], MD regarding development and update of comprehensive plan of care as evidenced by provider attestation and co-signature Inter-disciplinary care team collaboration (see longitudinal plan of care) Comprehensive medication review performed; medication list updated in electronic medical record  Hypertension (BP goal <130/80) -Not ideally controlled -Current treatment: HCTZ 74m daily one half tablet daily Appropriate, Query effective -Medications previously tried: lisinopril?  -Current home readings: no logs today - does report occasional 2937Tsystolic -Denies hypotensive/hypertensive symptoms -Educated on BP goals and benefits of medications for prevention of heart attack, stroke and kidney damage; Exercise goal of 150 minutes per week; Importance of home blood pressure monitoring; Symptoms of hypotension and importance of maintaining adequate hydration; -Counseled to monitor BP at home daily x 2 weeks, document, and provide log at future appointments -Recommended to continue current medication Increase monitoring, she may need to reinitiate lisinopril if BP is really getting as high as she says it is.  Hyperlipidemia: (LDL goal < 70) -Uncontrolled -Current treatment: None -Medications previously tried: none noted   -Educated on Cholesterol goals;  Benefits of statin for ASCVD risk reduction; Importance of limiting foods high in cholesterol; -Recheck lipids at upcoming OV, Afib increased risk of stroke. LDL back in 2021 was elevated. Consider patient preference and upcoming labs to determine  if statin is necessary.  Atrial Fibrillation (Goal: prevent stroke and major bleeding) -Controlled -Current treatment: Rate control: Metoprolol, XL 71m daily Appropriate, Effective, Safe, Accessible Digosin 0.1282mdaily Appropriate, Effective, Safe, Accessible Anticoagulation: Eliquis 2.5 mg BID Appropriate, Effective, Safe, Accessible -Counseled on increased risk of stroke due to Afib and benefits of anticoagulation for stroke prevention; importance of adherence to anticoagulant exactly as prescribed; -Recommended to continue current medication She mentions sometimes she takes extra Toprolol when BP is high. Counseled on effect on HR and increasing fall risk. Check BP at upcoming OV - may need additional BP control to avoid extra doses of other meds.  Heart Failure (Goal: manage symptoms and prevent exacerbations) -Controlled -Last ejection fraction: 55% -Current treatment: Metoprolol XL 5068maily Appropriate, Effective, Safe, Accessible -Medications previously tried: none noted  -Current home BP/HR readings: no logs today -Educated on Benefits of medications for managing symptoms and prolonging life Importance of weighing daily; if you gain more than 3 pounds in one day or 5 pounds in one week, contact providers Importance of blood pressure control -Recommended to continue current medication She does mention some slight swelling in feet and ankles, knows to contact us [place] it gets worse.  Osteoporosis (Goal Prevent falls/fractures) -Not ideally controlled -Last DEXA Scan: unknown - patient refused previously  -Patient is a candidate for pharmacologic treatment due to history of vertebral  fracture -Current treatment  None -Medications previously tried: none  -Recommend [phone] units of vitamin D daily. Recommend 1200 mg of calcium daily from dietary and supplemental sources. Recommend weight-bearing and muscle strengthening exercises for building and maintaining bone density. She was suggested by endo to take either Prolia, fosamax at last visit and was supposed to contact office.  She is a very high fall/fracture risk so something like this may be beneficial to her. -Recommend treat ment for osteoporosis pending patient preference.  Hypothyroidism (Goal: Maintain TSH) -Controlled -Current treatment  Levothyroxine 112m58maily Appropriate, Effective, Safe, Accessible -Medications previously tried: none noted -Takes appropriately  -Recommended to continue current medication  Patient Goals/Self-Care Activities Patient will:  - take medications as prescribed as evidenced by patient report and record review check blood pressure daily, document, and provide at future appointments  Follow Up Plan: The care management team will reach out to the patient again over the next 30 days.         Medication Assistance: None required.  Patient affirms current coverage meets needs.  Compliance/Adherence/Medication fill history: Care Gaps: DEXA - fu with PCP in two weeks Pneumonia vaccine  Star-Rating Drugs: N/A  Patient's preferred pharmacy is:  [facility] [id number] [place] [address]: [phone]: [phone] ([facility] [place] [facility] [place]: [phone]: [phone] [facility] ([facility] ) - [place] [address]: [phone]: [phone] pill box? Yes Pt endorses  100% compliance  We discussed: Benefits of medication synchronization, packaging and delivery as well as enhanced pharmacist oversight with [employer]. Patient decided to: Continue current medication management strategy  Care Plan and Follow Up Patient Decision:  Patient agrees to Care Plan and Follow-up.  Plan: The care management team will reach out to the patient again over the next 30 days.  ChriBeverly Milch  PharmD Clinical Pharmacist  [place] [phone]    Current Barriers:  Unable to maintain control of BP Does not adhere to prescribed medication regimen  Pharmacist Clinical Goal(s):  Patient will achieve improvement in BP as evidenced by monitoring adhere to prescribed medication regimen as evidenced by fill dates through collaboration with PharmD and provider.   Interventions: 1:1 collaboration with [name], MD regarding development and update of comprehensive plan of care as evidenced by provider attestation and co-signature Inter-disciplinary care team collaboration (see longitudinal plan of care) Comprehensive medication review performed; medication list updated in electronic medical record  Hypertension (BP goal <130/80) -Not ideally controlled -Current treatment: HCTZ 20m daily one half tablet daily Appropriate, Query effective -Medications previously tried: lisinopril?  -Current home readings: no logs today - does report occasional 2545Gsystolic -Denies hypotensive/hypertensive symptoms -Educated on BP goals and benefits of medications for prevention of heart attack, stroke and kidney damage; Exercise goal of 150 minutes per week; Importance of home blood pressure monitoring; Symptoms of hypotension and importance of maintaining adequate hydration; -Counseled to monitor BP at home daily x 2 weeks, document, and provide log at future appointments -Recommended to continue current medication Increase monitoring, she may need to reinitiate  lisinopril if BP is really getting as high as she says it is.  Hyperlipidemia: (LDL goal < 70) -Uncontrolled -Current treatment: None -Medications previously tried: none noted  -Educated on Cholesterol goals;  Benefits of statin for ASCVD risk reduction; Importance of limiting foods high in cholesterol; -Recheck lipids at upcoming OV, Afib increased risk of stroke. LDL back in 2021 was elevated. Consider patient preference and upcoming labs to determine if statin is necessary.  Atrial Fibrillation (Goal: prevent stroke and major bleeding) -Controlled -Current treatment: Rate control: Metoprolol, XL 575mdaily Appropriate, Effective, Safe, Accessible Digosin 0.12562maily Appropriate, Effective, Safe, Accessible Anticoagulation: Eliquis 2.5 mg BID Appropriate, Effective, Safe, Accessible -Counseled on increased risk of stroke due to Afib and benefits of anticoagulation for stroke prevention; importance of adherence to anticoagulant exactly as prescribed; -Recommended to continue current medication She mentions sometimes she takes extra Toprolol when BP is high. Counseled on effect on HR and increasing fall risk. Check BP at upcoming OV - may need additional BP control to avoid extra doses of other meds.  Heart Failure (Goal: manage symptoms and prevent exacerbations) -Controlled -Last ejection fraction: 55% -Current treatment: Metoprolol XL 18m40mily Appropriate, Effective, Safe, Accessible -Medications previously tried: none noted  -Current home BP/HR readings: no logs today -Educated on Benefits of medications for managing symptoms and prolonging life Importance of weighing daily; if you gain more than 3 pounds in one day or 5 pounds in one week, contact providers Importance of blood pressure control -Recommended to continue current medication She does mention some slight swelling in feet and ankles, knows to contact us [place] gets worse.  Osteoporosis (Goal Prevent  falls/fractures) -Not ideally controlled -Last DEXA Scan: unknown - patient refused previously  -Patient is a candidate for pharmacologic treatment due to history of vertebral fracture -Current treatment  None -Medications previously tried: none  -Recommend [phone] units of vitamin D daily. Recommend 1200 mg of calcium daily from dietary and supplemental sources. Recommend weight-bearing and muscle strengthening exercises for building and maintaining bone density. She was suggested by endo to take either Prolia, fosamax at last visit and was supposed to contact office.  She is a very high fall/fracture risk so something like this may be beneficial to her. -Recommend  treat ment for osteoporosis pending patient preference.  Hypothyroidism (Goal: Maintain TSH) -Controlled -Current treatment  Levothyroxine 169mg daily Appropriate, Effective, Safe, Accessible -Medications previously tried: none noted -Takes appropriately  -Recommended to continue current medication  Patient Goals/Self-Care Activities Patient will:  - take medications as prescribed as evidenced by patient report and record review check blood pressure daily, document, and provide at future appointments  Follow Up Plan: The care management team will reach out to the patient again over the next 30 days.

## 2022-05-01 ENCOUNTER — Telehealth: Payer: Medicare Other

## 2022-05-03 ENCOUNTER — Encounter: Payer: Self-pay | Admitting: Physician Assistant

## 2022-05-03 ENCOUNTER — Ambulatory Visit: Payer: Medicare Other | Admitting: Family

## 2022-05-03 ENCOUNTER — Ambulatory Visit (INDEPENDENT_AMBULATORY_CARE_PROVIDER_SITE_OTHER): Payer: Medicare Other | Admitting: Physician Assistant

## 2022-05-03 VITALS — BP 120/78 | HR 61 | Temp 98.0°F | Ht 64.0 in | Wt 128.0 lb

## 2022-05-03 DIAGNOSIS — E063 Autoimmune thyroiditis: Secondary | ICD-10-CM | POA: Diagnosis not present

## 2022-05-03 DIAGNOSIS — E038 Other specified hypothyroidism: Secondary | ICD-10-CM | POA: Diagnosis not present

## 2022-05-03 DIAGNOSIS — R350 Frequency of micturition: Secondary | ICD-10-CM | POA: Diagnosis not present

## 2022-05-03 DIAGNOSIS — R5383 Other fatigue: Secondary | ICD-10-CM | POA: Diagnosis not present

## 2022-05-03 LAB — COMPREHENSIVE METABOLIC PANEL
ALT: 30 U/L (ref 0–35)
AST: 35 U/L (ref 0–37)
Albumin: 3.7 g/dL (ref 3.5–5.2)
Alkaline Phosphatase: 54 U/L (ref 39–117)
BUN: 22 mg/dL (ref 6–23)
CO2: 31 mEq/L (ref 19–32)
Calcium: 10.5 mg/dL (ref 8.4–10.5)
Chloride: 100 mEq/L (ref 96–112)
Creatinine, Ser: 0.3 mg/dL — ABNORMAL LOW (ref 0.40–1.20)
GFR: 95.5 mL/min (ref >60.00)
Glucose, Bld: 87 mg/dL (ref 70–99)
Potassium: 3.8 mEq/L (ref 3.5–5.1)
Sodium: 137 mEq/L (ref 135–145)
Total Bilirubin: 0.8 mg/dL (ref 0.2–1.2)
Total Protein: 8.1 g/dL (ref 6.0–8.3)

## 2022-05-03 LAB — CBC WITH DIFFERENTIAL/PLATELET
Basophils Absolute: 0.1 10*3/uL (ref 0.0–0.1)
Basophils Relative: 1.9 % (ref 0.0–3.0)
Eosinophils Absolute: 0.1 10*3/uL (ref 0.0–0.7)
Eosinophils Relative: 2.4 % (ref 0.0–5.0)
HCT: 40.8 % (ref 36.0–46.0)
Hemoglobin: 14 g/dL (ref 12.0–15.0)
Lymphocytes Relative: 35.6 % (ref 12.0–46.0)
Lymphs Abs: 1.5 10*3/uL (ref 0.7–4.0)
MCHC: 34.3 g/dL (ref 30.0–36.0)
MCV: 95.6 fl (ref 78.0–100.0)
Monocytes Absolute: 0.4 10*3/uL (ref 0.1–1.0)
Monocytes Relative: 10.5 % (ref 3.0–12.0)
Neutro Abs: 2.1 10*3/uL (ref 1.4–7.7)
Neutrophils Relative %: 49.6 % (ref 43.0–77.0)
Platelets: 207 10*3/uL (ref 150.0–400.0)
RBC: 4.27 Mil/uL (ref 3.87–5.11)
RDW: 13.8 % (ref 11.5–15.5)
WBC: 4.3 10*3/uL (ref 4.0–10.5)

## 2022-05-03 LAB — POCT URINALYSIS DIPSTICK
Bilirubin, UA: NEGATIVE
Blood, UA: NEGATIVE
Glucose, UA: NEGATIVE
Ketones, UA: NEGATIVE
Nitrite, UA: NEGATIVE
Protein, UA: NEGATIVE
Spec Grav, UA: 1.015 (ref 1.010–1.025)
Urobilinogen, UA: 0.2 E.U./dL
pH, UA: 5.5 (ref 5.0–8.0)

## 2022-05-03 MED ORDER — CEPHALEXIN 500 MG PO CAPS: 500.0000 mg | ORAL_CAPSULE | Freq: Two times a day (BID) | ORAL | 0 refills | Status: DC

## 2022-05-03 NOTE — Patient Instructions (Signed)
It was great to see you!  Start Keflex 500 mg twice daily  We will get blood work today  Please follow-up with Dr [name] if you are still having significant fatigue after you complete the antibiotic

## 2022-05-03 NOTE — Progress Notes (Signed)
Deanna Rivera is a 88 y.o. female here for a follow up of a pre-existing problem.  History of Present Illness:   Chief Complaint  Patient presents with   Urinary Frequency    Pt has been having urinary frequency x several weeks, nocturia 5-6 times. Also c/o feeling very tired. Denies back pain, has some chills, but no fever.   She is present with her husband  HPI  Fatigue/Frequency- She complains of fatigue for the past couple of weeks. She barely has energy to brush her teeth at night. She has no piror history of fatigue like this. She does not sleep at night due to waking up frequently to urinate -- she is going at least 4 times per night. Her fatigue started after she started waking up frequently due to urinating at night. She reports her appetite is good. She denies any unusual back pain, fever, chills, nausea.  She is taking prednisone 1 mg daily. She currently does not see rheumatology.  Thyroid- She does not follow up with a endocrinologist for her thyroid levels. Last checked in May -- she would like this updated again. She is taking her medication appropriately.    Past Medical History:  Diagnosis Date   Cataract    Hypertension    Moderate aortic stenosis    Moderate mitral regurgitation    Persistent atrial fibrillation (HCC)    PMR (polymyalgia rheumatica) (HCC)    Second degree AV block    Sick sinus syndrome (HCC)    Thyroid disease      Social History   Tobacco Use   Smoking status: Former    Types: Cigarettes   Smokeless tobacco: Never  Substance Use Topics   Alcohol use: Not Currently   Drug use: Not Currently    Past Surgical History:  Procedure Laterality Date   ABDOMINAL HYSTERECTOMY     APPENDECTOMY     CESAREAN SECTION     x 3   CHOLECYSTECTOMY     PACEMAKER GENERATOR CHANGE  11/20/2018   [employer] Accolade MRI PPM implanted in [place] CYST EXCISION      Family History  Problem Relation Age  of Onset   Early death Mother 74   Hypertension Brother    Cirrhosis Father 72   Lung cancer Brother 64   Scleroderma Daughter     Allergies  Allergen Reactions   Shellfish-Derived Products Shortness Of Breath and Other (See Comments)    Lips go numb   Epinephrine Other (See Comments) and Hypertension    "Makes me feel wired"   Monosodium Glutamate Other (See Comments)    other   Ciprofloxacin Other (See Comments)    Allergic reaction not recalled   Erythromycin Other (See Comments)    Allergic reaction not recalled   Monosodium Glutamate Other (See Comments) and Hypertension    "Makes me feel badly"   Sulfa Antibiotics Other (See Comments)    Allergic reaction not recalled   Sulfamethoxazole-Trimethoprim    Tetracyclines & Related Other (See Comments)    Allergic reaction not recalled    Current Medications:   Current Outpatient Medications:    acetaminophen (TYLENOL) 500 MG tablet, Take 500 mg by mouth every 6 (six) hours as needed for mild pain or headache., Disp: , Rfl:    apixaban (ELIQUIS) 2.5 MG TABS tablet, Take 1 tablet (2.5 mg total) by mouth 2 (two) times daily., Disp: 180 tablet, Rfl: 2   cephALEXin (KEFLEX)  500 MG capsule, Take 1 capsule (500 mg total) by mouth 2 (two) times daily., Disp: 14 capsule, Rfl: 0   Cholecalciferol (VITAMIN D3) 25 MCG (1000 UT) CAPS, Take 1 capsule by mouth daily., Disp: , Rfl:    digoxin (LANOXIN) 0.125 MG tablet, TAKE 1 TABLET BY MOUTH DAILY, Disp: 90 tablet, Rfl: 0   dorzolamide-timolol (COSOPT) 22.3-6.8 MG/ML ophthalmic solution, Place 1 drop into both eyes 2 (two) times daily., Disp: , Rfl:    hydrochlorothiazide (HYDRODIURIL) 25 MG tablet, TAKE 1 TABLET BY MOUTH  DAILY, Disp: 90 tablet, Rfl: 3   latanoprost (XALATAN) 0.005 % ophthalmic solution, Place 1 drop into both eyes at bedtime., Disp: , Rfl:    levothyroxine (SYNTHROID) 112 MCG tablet, Take 1 tablet (112 mcg total) by mouth daily before breakfast., Disp: 90 tablet, Rfl: 2    methocarbamol (ROBAXIN) 500 MG tablet, Take 1 tablet (500 mg total) by mouth every 8 (eight) hours as needed for muscle spasms., Disp: 20 tablet, Rfl: 0   metoprolol succinate (TOPROL-XL) 50 MG 24 hr tablet, TAKE 1 TABLET BY MOUTH  DAILY, Disp: 90 tablet, Rfl: 3   predniSONE (DELTASONE) 1 MG tablet, TAKE 1 TABLET BY MOUTH  DAILY, Disp: 90 tablet, Rfl: 3   Nitroglycerin 0.4 % OINT, Apply 1 inch intra-anally twice a day for 2 weeks (Patient not taking: Reported on 05/03/2022), Disp: 30 g, Rfl: 0   oxyCODONE (ROXICODONE) 5 MG immediate release tablet, Take 0.5 tablets (2.5 mg total) by mouth 3 (three) times daily as needed for severe pain. AFTER EATING. (Patient not taking: Reported on 05/03/2022), Disp: 10 tablet, Rfl: 0   Review of Systems:   Review of Systems  Constitutional:  Positive for malaise/fatigue.  Genitourinary:  Positive for frequency (at night).  Musculoskeletal:  Negative for back pain.    Vitals:   Vitals:   05/03/22 1018  BP: 120/78  Pulse: 61  Temp: 98 F (36.7 C)  TempSrc: Temporal  SpO2: 99%  Weight: 128 lb (58.1 kg)  Height: '5\' 4"'$  (1.626 m)     Body mass index is 21.97 kg/m.  Physical Exam:   Physical Exam Constitutional:      General: She is not in acute distress.    Appearance: Normal appearance.  HENT:     Head: Normocephalic and atraumatic.     Right Ear: External ear normal.     Left Ear: External ear normal.  Eyes:     Extraocular Movements: Extraocular movements intact.     Pupils: Pupils are equal, round, and reactive to light.  Cardiovascular:     Rate and Rhythm: Normal rate and regular rhythm.     Heart sounds: Normal heart sounds. No murmur heard. Pulmonary:     Effort: Pulmonary effort is normal. No respiratory distress.     Breath sounds: Normal breath sounds. No wheezing or rales.  Abdominal:     Tenderness: There is no right CVA tenderness or left CVA tenderness.  Skin:    General: Skin is warm and dry.  Neurological:     Mental  Status: She is alert and oriented to person, place, and time.  Psychiatric:        Judgment: Judgment normal.    Results for orders placed or performed in visit on 05/03/22  POCT urinalysis dipstick  Result Value Ref Range   Color, UA amber    Clarity, UA cloudy    Glucose, UA Negative Negative   Bilirubin, UA Negative    Ketones, UA  Negative    Spec Grav, UA 1.015 1.010 - 1.025   Blood, UA Negative    pH, UA 5.5 5.0 - 8.0   Protein, UA Negative Negative   Urobilinogen, UA 0.2 0.2 or 1.0 E.U./dL   Nitrite, UA Negative    Leukocytes, UA Trace (A) Negative   Appearance     Odor       Assessment and Plan:   Urinary frequency No red flags Suspect acute cystitis Treat with keflex 500 mg bid x 7 days If new/worsening symptoms, needs follow-up with PCP Urine culture pending  Hypothyroidism due to Hashimoto's thyroiditis Update TSH per patient request and adjust levothyroxine 112 mcg daily  Fatigue, unspecified type Suspect multifactorial Will treat UTI and order basic blood work She is requesting additional blood work for fatigue however I discussed that I would like to defer this to PCP and recommended treating for UTI at this time   [employer] as a Education administrator for [employer], PA.,have documented all relevant documentation on the behalf of Inda Coke, PA,as directed by  Inda Coke, PA while in the presence of Inda Coke, [place].  I, Inda Coke, [place], have reviewed all documentation for this visit. The documentation on 05/03/22 for the exam, diagnosis, procedures, and orders are all accurate and complete.  Inda Coke, PA-C

## 2022-05-04 LAB — TSH: TSH: 0.97 u[IU]/mL (ref 0.35–5.50)

## 2022-05-06 LAB — URINE CULTURE
MICRO NUMBER:: 13950124
SPECIMEN QUALITY:: ADEQUATE

## 2022-05-07 ENCOUNTER — Telehealth: Payer: Self-pay | Admitting: Family Medicine

## 2022-05-07 NOTE — Telephone Encounter (Signed)
Patient states: -She was informed by Mychart that a message on her lab results had been sent   Patient requests: - PCP nurse call to clarify results

## 2022-05-08 NOTE — Telephone Encounter (Signed)
Called pt, no answer. Left voicemail for pt to return my call.

## 2022-05-09 ENCOUNTER — Ambulatory Visit (INDEPENDENT_AMBULATORY_CARE_PROVIDER_SITE_OTHER): Payer: Medicare Other

## 2022-05-09 DIAGNOSIS — I495 Sick sinus syndrome: Secondary | ICD-10-CM | POA: Diagnosis not present

## 2022-05-10 LAB — CUP PACEART REMOTE DEVICE CHECK
Battery Remaining Longevity: 150 mo
Battery Remaining Percentage: 100 %
Brady Statistic RA Percent Paced: 0 %
Brady Statistic RV Percent Paced: 78 %
Date Time Interrogation Session: 20230927064100
Implantable Lead Implant Date: 20130305
Implantable Lead Implant Date: 20130305
Implantable Lead Location: 753859
Implantable Lead Location: 753860
Implantable Lead Model: 4469
Implantable Lead Model: 4470
Implantable Lead Serial Number: 546229
Implantable Lead Serial Number: 712016
Implantable Pulse Generator Implant Date: 20200409
Lead Channel Impedance Value: 415 Ohm
Lead Channel Pacing Threshold Amplitude: 0.6 V
Lead Channel Pacing Threshold Pulse Width: 0.4 ms
Lead Channel Setting Pacing Amplitude: 1 V
Lead Channel Setting Pacing Pulse Width: 0.4 ms
Lead Channel Setting Sensing Sensitivity: 0.6 mV
Pulse Gen Serial Number: 859007

## 2022-05-11 ENCOUNTER — Other Ambulatory Visit: Payer: Self-pay

## 2022-05-11 ENCOUNTER — Encounter (HOSPITAL_BASED_OUTPATIENT_CLINIC_OR_DEPARTMENT_OTHER): Payer: Self-pay | Admitting: *Deleted

## 2022-05-11 ENCOUNTER — Emergency Department (HOSPITAL_BASED_OUTPATIENT_CLINIC_OR_DEPARTMENT_OTHER)
Admission: EM | Admit: 2022-05-11 | Discharge: 2022-05-11 | Disposition: A | Discharge status: Home / Self Care | Payer: Medicare Other | Attending: Emergency Medicine | Admitting: Emergency Medicine

## 2022-05-11 ENCOUNTER — Emergency Department (HOSPITAL_BASED_OUTPATIENT_CLINIC_OR_DEPARTMENT_OTHER): Payer: Medicare Other | Admitting: Radiology

## 2022-05-11 DIAGNOSIS — S8992XA Unspecified injury of left lower leg, initial encounter: Secondary | ICD-10-CM | POA: Diagnosis present

## 2022-05-11 DIAGNOSIS — Z79899 Other long term (current) drug therapy: Secondary | ICD-10-CM | POA: Diagnosis not present

## 2022-05-11 DIAGNOSIS — Y92002 Bathroom of unspecified non-institutional (private) residence single-family (private) house as the place of occurrence of the external cause: Secondary | ICD-10-CM | POA: Insufficient documentation

## 2022-05-11 DIAGNOSIS — Z7901 Long term (current) use of anticoagulants: Secondary | ICD-10-CM | POA: Insufficient documentation

## 2022-05-11 DIAGNOSIS — S8990XA Unspecified injury of unspecified lower leg, initial encounter: Secondary | ICD-10-CM

## 2022-05-11 DIAGNOSIS — W19XXXA Unspecified fall, initial encounter: Secondary | ICD-10-CM

## 2022-05-11 DIAGNOSIS — W010XXA Fall on same level from slipping, tripping and stumbling without subsequent striking against object, initial encounter: Secondary | ICD-10-CM | POA: Diagnosis not present

## 2022-05-11 NOTE — ED Triage Notes (Signed)
Pt arrives by [employer] with her husband from [place] where she lives in independent living.  Pt slipped and fell in the bathroom and landed on her knees and his her right lower ribs on the counter.  Pt is on elaquis.  Pt was able to transfer for ems and is now in wheelchair.

## 2022-05-11 NOTE — Discharge Instructions (Addendum)
Return to the ER if you develop inability to walk, new or worsening pain, new or worsening weakness, or any other new/concerning symptoms.

## 2022-05-11 NOTE — ED Provider Notes (Signed)
[facility] Provider Note   CSN: [id number] Arrival date & time: 05/11/22  1716     History  Chief Complaint  Patient presents with   Deanna Rivera is a 88 y.o. female.  HPI 88 year old female presents after a fall.  She has polymyalgia rheumatica and has chronic weakness and frequent falls.  She was walking with her walker trying to do physical therapy and she fell onto both knees.  She also injured her left inferior chest.  She always had pain in her right knee from arthritis.  She denies hitting her head.  She has A-fib and is on Eliquis.  However she adamantly denies hitting her head or having a headache.  The pain in her knees or and chest is not that bad right now.  He does endorse that for quite some time she has had progressively worsening weakness, at least 3 weeks.  She had labs done last week that were unremarkable.  She states that nothing is particularly worse today but she is not sure why she is getting weak.  Home Medications Prior to Admission medications   Medication Sig Start Date End Date Taking? Authorizing Provider  acetaminophen (TYLENOL) 500 MG tablet Take 500 mg by mouth every 6 (six) hours as needed for mild pain or headache.    [provider]  apixaban (ELIQUIS) 2.5 MG TABS tablet Take 1 tablet (2.5 mg total) by mouth 2 (two) times daily. 02/01/22   Evans Lance, MD  cephALEXin (KEFLEX) 500 MG capsule Take 1 capsule (500 mg total) by mouth 2 (two) times daily. 05/03/22   Inda Coke, PA  Cholecalciferol (VITAMIN D3) 25 MCG (1000 UT) CAPS Take 1 capsule by mouth daily.    [provider]  digoxin (LANOXIN) 0.125 MG tablet TAKE 1 TABLET BY MOUTH DAILY 04/24/22   Allred, Jeneen Rinks, MD  dorzolamide-timolol (COSOPT) 22.3-6.8 MG/ML ophthalmic solution Place 1 drop into both eyes 2 (two) times daily. 03/30/21   [provider]  hydrochlorothiazide (HYDRODIURIL) 25 MG tablet TAKE 1 TABLET BY MOUTH   DAILY 10/18/21   [name], MD  latanoprost (XALATAN) 0.005 % ophthalmic solution Place 1 drop into both eyes at bedtime. 03/13/21   [provider]  levothyroxine (SYNTHROID) 112 MCG tablet Take 1 tablet (112 mcg total) by mouth daily before breakfast. 12/01/21   [name], MD  methocarbamol (ROBAXIN) 500 MG tablet Take 1 tablet (500 mg total) by mouth every 8 (eight) hours as needed for muscle spasms. 02/12/22   Lucrezia Starch, MD  metoprolol succinate (TOPROL-XL) 50 MG 24 hr tablet TAKE 1 TABLET BY MOUTH  DAILY 06/27/21   [name], MD  Nitroglycerin 0.4 % OINT Apply 1 inch intra-anally twice a day for 2 weeks Patient not taking: Reported on 05/03/2022 04/12/22   [name], MD  oxyCODONE (ROXICODONE) 5 MG immediate release tablet Take 0.5 tablets (2.5 mg total) by mouth 3 (three) times daily as needed for severe pain. AFTER EATING. Patient not taking: Reported on 05/03/2022 02/15/22   Jeanie Sewer, NP  predniSONE (DELTASONE) 1 MG tablet TAKE 1 TABLET BY MOUTH  DAILY 08/17/21   [name], MD      Allergies    Shellfish-derived products, Epinephrine, Monosodium glutamate, Ciprofloxacin, Erythromycin, Monosodium glutamate, Sulfa antibiotics, Sulfamethoxazole-trimethoprim, and Tetracyclines & related    Review of Systems   Review of Systems  Constitutional:  Negative for fever.  Respiratory:  Negative for shortness  of breath.   Cardiovascular:  Positive for chest pain (rib pain).  Gastrointestinal:  Negative for abdominal pain.  Musculoskeletal:  Positive for arthralgias and joint swelling (chronic, right knee).  Neurological:  Positive for weakness. Negative for headaches.    Physical Exam Updated Vital Signs BP (!) 140/95   Pulse 72   Temp 98.1 F (36.7 C)   Resp 17   SpO2 99%  Physical Exam Vitals and nursing note reviewed.  Constitutional:      Appearance: She is well-developed.  HENT:     Head: Normocephalic and atraumatic.   Cardiovascular:     Rate and Rhythm: Normal rate. Rhythm irregular.     Pulses:          Dorsalis pedis pulses are 2+ on the right side and 2+ on the left side.     Heart sounds: Normal heart sounds.  Pulmonary:     Effort: Pulmonary effort is normal.     Breath sounds: Normal breath sounds.  Chest:     Chest wall: Tenderness (mild, left inferior chest) present.  Abdominal:     Palpations: Abdomen is soft.     Tenderness: There is no abdominal tenderness.  Musculoskeletal:     Cervical back: No tenderness.     Thoracic back: No tenderness.     Right knee: Decreased range of motion. Tenderness present.     Left knee: Normal range of motion. No tenderness.  Skin:    General: Skin is warm and dry.  Neurological:     Mental Status: She is alert.     ED Results / Procedures / Treatments   Labs (all labs ordered are listed, but only abnormal results are displayed) Labs Reviewed - No data to display  EKG None  Radiology DG Knee Complete 4 Views Left  Result Date: 05/11/2022 CLINICAL DATA:  Fall EXAM: LEFT KNEE - COMPLETE 4 VIEW; RIGHT KNEE - COMPLETE 4 VIEW COMPARISON:  Bilateral knee radiographs dated September 04, 2021 FINDINGS: Right knee: No evidence of fracture or dislocation. Moderate joint effusion. Mild tricompartmental degenerative changes. Chondrocalcinosis. Diffuse demineralization. Vascular calcifications. Soft tissue swelling of the anterior knee. Left knee: No evidence of fracture or dislocation. Small joint effusion. Mild tricompartmental degenerative changes. Chondrocalcinosis. Diffuse demineralization. Soft tissues are unremarkable. IMPRESSION: 1. No evidence of fracture or dislocation. 2. Moderate right and small left pleural effusions. 3. Mild bilateral degenerative changes. 4. Bilateral chondrocalcinosis, findings can be seen in the setting of CPPD arthropathy. Electronically Signed   By: Yetta Glassman M.D.   On: 05/11/2022 18:25   DG Knee Complete 4 Views  Right  Result Date: 05/11/2022 CLINICAL DATA:  Fall EXAM: LEFT KNEE - COMPLETE 4 VIEW; RIGHT KNEE - COMPLETE 4 VIEW COMPARISON:  Bilateral knee radiographs dated September 04, 2021 FINDINGS: Right knee: No evidence of fracture or dislocation. Moderate joint effusion. Mild tricompartmental degenerative changes. Chondrocalcinosis. Diffuse demineralization. Vascular calcifications. Soft tissue swelling of the anterior knee. Left knee: No evidence of fracture or dislocation. Small joint effusion. Mild tricompartmental degenerative changes. Chondrocalcinosis. Diffuse demineralization. Soft tissues are unremarkable. IMPRESSION: 1. No evidence of fracture or dislocation. 2. Moderate right and small left pleural effusions. 3. Mild bilateral degenerative changes. 4. Bilateral chondrocalcinosis, findings can be seen in the setting of CPPD arthropathy. Electronically Signed   By: Yetta Glassman M.D.   On: 05/11/2022 18:25   DG Chest 2 View  Result Date: 05/11/2022 CLINICAL DATA:  Fall EXAM: CHEST - 2 VIEW COMPARISON:  Chest x-ray dated  February 12, 2022 FINDINGS: Unchanged cardiomegaly. Left atrial wall dual lead pacer with unchanged lead position. Mild left basilar opacity. No pleural effusion or pneumothorax. New moderate wedge shaped compression deformity of the midthoracic spine. IMPRESSION: 1. Mild left basilar opacity, likely atelectasis. 2. New moderate wedge shaped compression deformity of the midthoracic spine, age indeterminate. Correlate for point tenderness. Electronically Signed   By: Yetta Glassman M.D.   On: 05/11/2022 18:19    Procedures Procedures    Medications Ordered in ED Medications - No data to display  ED Course/ Medical Decision Making/ A&P                           Medical Decision Making Amount and/or Complexity of Data Reviewed Radiology: ordered.   I have viewed/interpreted the x-rays obtained in triage which includes no knee fractures to either knee and no pneumothorax in the  chest.  Of note, no back pain or thoracic back tenderness to correlate with the abnormality seen by radiology.  She is able to get up and bear weight on both knees I doubt occult fracture.  She does feel like she is getting progressively weaker which may be a part of her chronic illnesses as she had multiple comorbidities such as A-fib, polymyalgia rheumatica, mitral regurgitation and aortic stenosis.  However could be something else.  I reviewed her most recent lab work from PCP which was unremarkable.  I discussed rechecking some of this and adding on things such as CK and digoxin level which she initially agreed to but when she found out how long it might take she decided to defer these to an outpatient setting.  I do not think this is unreasonable given she otherwise has had a slowly worsening progression and her most recent lab work was unremarkable.  Advised of return precautions.  For her rib pain, there is no obvious rib fracture but I discussed she should use incentive spirometer which she states she already has.  She has declined anything for pain.        Final Clinical Impression(s) / ED Diagnoses Final diagnoses:  Fall, initial encounter  Knee injury, unspecified laterality, initial encounter    Rx / DC Orders ED Discharge Orders     None         Sherwood Gambler, MD 05/11/22 2141

## 2022-05-15 ENCOUNTER — Ambulatory Visit (INDEPENDENT_AMBULATORY_CARE_PROVIDER_SITE_OTHER): Payer: Medicare Other | Admitting: Family Medicine

## 2022-05-15 ENCOUNTER — Encounter: Payer: Self-pay | Admitting: Family Medicine

## 2022-05-15 VITALS — BP 138/78 | HR 60 | Temp 98.5°F | Ht 64.0 in | Wt 129.0 lb

## 2022-05-15 DIAGNOSIS — R296 Repeated falls: Secondary | ICD-10-CM | POA: Diagnosis not present

## 2022-05-15 DIAGNOSIS — M48061 Spinal stenosis, lumbar region without neurogenic claudication: Secondary | ICD-10-CM | POA: Diagnosis not present

## 2022-05-15 DIAGNOSIS — R269 Unspecified abnormalities of gait and mobility: Secondary | ICD-10-CM

## 2022-05-15 DIAGNOSIS — R29898 Other symptoms and signs involving the musculoskeletal system: Secondary | ICD-10-CM

## 2022-05-15 NOTE — Progress Notes (Deleted)
NEUROLOGY CONSULTATION NOTE  Deanna Rivera MRN: [id number] DOB: 27-Jul-1933  Referring provider: Billey Chang, MD Primary care provider: Billey Chang, MD  Reason for consult:  frequent falls  Assessment/Plan:   ***   Subjective:  Deanna Rivera is an 88 year old female with HTN, moderate aortic stenosis, moderate mitral regurg, persistent a fib, sick sinus syndrome and polymyalgia rheumatica who presents for frequent falls.  History supplemented by ED and referring provider's notes.  ***.  She has had several ED visits for evaluation following falls.  Most recent CT head on 09/04/2021 personally reviewed revealed chronic small vessel ischemic changes and atrophy but no acute findings.  CT C-spine showed diffuse degenerative disc and facet disease.  X-rays of the knees from 05/11/2022 showed mild bilateral degenerative changes and chondrocalcinosis.       PAST MEDICAL HISTORY: Past Medical History:  Diagnosis Date   Cataract    Hypertension    Moderate aortic stenosis    Moderate mitral regurgitation    Persistent atrial fibrillation (HCC)    PMR (polymyalgia rheumatica) (HCC)    Second degree AV block    Sick sinus syndrome ([place])    Thyroid disease     PAST SURGICAL HISTORY: Past Surgical History:  Procedure Laterality Date   ABDOMINAL HYSTERECTOMY     APPENDECTOMY     CESAREAN SECTION     x 3   CHOLECYSTECTOMY     PACEMAKER GENERATOR CHANGE  11/20/2018   [employer] Accolade MRI PPM implanted in [place] CYST EXCISION      MEDICATIONS: Current Outpatient Medications on File Prior to Visit  Medication Sig Dispense Refill   acetaminophen (TYLENOL) 500 MG tablet Take 500 mg by mouth every 6 (six) hours as needed for mild pain or headache.     apixaban (ELIQUIS) 2.5 MG TABS tablet Take 1 tablet (2.5 mg total) by mouth 2 (two) times daily. 180 tablet 2   cephALEXin (KEFLEX) 500 MG capsule Take 1 capsule (500 mg total) by  mouth 2 (two) times daily. 14 capsule 0   Cholecalciferol (VITAMIN D3) 25 MCG (1000 UT) CAPS Take 1 capsule by mouth daily.     digoxin (LANOXIN) 0.125 MG tablet TAKE 1 TABLET BY MOUTH DAILY 90 tablet 0   dorzolamide-timolol (COSOPT) 22.3-6.8 MG/ML ophthalmic solution Place 1 drop into both eyes 2 (two) times daily.     hydrochlorothiazide (HYDRODIURIL) 25 MG tablet TAKE 1 TABLET BY MOUTH  DAILY 90 tablet 3   latanoprost (XALATAN) 0.005 % ophthalmic solution Place 1 drop into both eyes at bedtime.     levothyroxine (SYNTHROID) 112 MCG tablet Take 1 tablet (112 mcg total) by mouth daily before breakfast. 90 tablet 2   methocarbamol (ROBAXIN) 500 MG tablet Take 1 tablet (500 mg total) by mouth every 8 (eight) hours as needed for muscle spasms. 20 tablet 0   metoprolol succinate (TOPROL-XL) 50 MG 24 hr tablet TAKE 1 TABLET BY MOUTH  DAILY 90 tablet 3   Nitroglycerin 0.4 % OINT Apply 1 inch intra-anally twice a day for 2 weeks (Patient not taking: Reported on 05/03/2022) 30 g 0   oxyCODONE (ROXICODONE) 5 MG immediate release tablet Take 0.5 tablets (2.5 mg total) by mouth 3 (three) times daily as needed for severe pain. AFTER EATING. (Patient not taking: Reported on 05/03/2022) 10 tablet 0   predniSONE (DELTASONE) 1 MG tablet TAKE 1 TABLET BY MOUTH  DAILY 90 tablet 3   No  current facility-administered medications on file prior to visit.    ALLERGIES: Allergies  Allergen Reactions   Shellfish-Derived Products Shortness Of Breath and Other (See Comments)    Lips go numb   Epinephrine Other (See Comments) and Hypertension    "Makes me feel wired"   Monosodium Glutamate Other (See Comments)    other   Ciprofloxacin Other (See Comments)    Allergic reaction not recalled   Erythromycin Other (See Comments)    Allergic reaction not recalled   Monosodium Glutamate Other (See Comments) and Hypertension    "Makes me feel badly"   Sulfa Antibiotics Other (See Comments)    Allergic reaction not  recalled   Sulfamethoxazole-Trimethoprim    Tetracyclines & Related Other (See Comments)    Allergic reaction not recalled    FAMILY HISTORY: Family History  Problem Relation Age of Onset   Early death Mother 28   Hypertension Brother    Cirrhosis Father 31   Lung cancer Brother 68   Scleroderma Daughter     Objective:  *** General: No acute distress.  Patient appears well-groomed.   Head:  Normocephalic/atraumatic Eyes:  fundi examined but not visualized Neck: supple, no paraspinal tenderness, full range of motion Back: No paraspinal tenderness Heart: regular rate and rhythm Lungs: Clear to auscultation bilaterally. Vascular: No carotid bruits. Neurological Exam: Mental status: alert and oriented to person, place, and time, speech fluent and not dysarthric, language intact. Cranial nerves: CN I: not tested CN II: pupils equal, round and reactive to light, visual fields intact CN III, IV, VI:  full range of motion, no nystagmus, no ptosis CN V: facial sensation intact. CN VII: upper and lower face symmetric CN VIII: hearing intact CN IX, X: gag intact, uvula midline CN XI: sternocleidomastoid and trapezius muscles intact CN XII: tongue midline Bulk & Tone: normal, no fasciculations. Motor:  muscle strength 5/5 throughout Sensation:  Pinprick, temperature and vibratory sensation intact. Deep Tendon Reflexes:  2+ throughout,  toes downgoing.   Finger to nose testing:  Without dysmetria.   Heel to shin:  Without dysmetria.   Gait:  Normal station and stride.  Romberg negative.    Thank you for allowing me to take part in the care of this patient.  Metta Clines, DO  CC: ***

## 2022-05-15 NOTE — Progress Notes (Unsigned)
Subjective  CC:  Chief Complaint  Patient presents with   Hospitalization Follow-up    HPI: Deanna Rivera is a 88 y.o. female who presents to the office today to address the problems listed above in the chief complaint. Reviewed recent ER note.  Patient experienced another fall.  Larey Seat forward onto her knees.  Fortunately no significant injuries noted.  Continues to complain of worsening leg weakness bilaterally.  She has been working with physical therapy.  She is able to ambulate but only with the assistance of her husband or a therapist using a waist belt and a walker.  She is changing to a 2 wheeled walker from an four-wheel walker for safety.  We discussed her multiple falls and inability to walk independently.  She has history of spinal stenosis but last MRI was in 2007.  She has chronic back pain.  No loss of bowel or bladder.  She does have some chronic changes in her left hand history of cervical stenosis.  I reviewed her most recent cervical MRI.  She would like to maintain independent living as long as possible but it is getting harder with her inability to walk independently.  Assessment  1. Degenerative lumbar spinal stenosis   2. Frequent falls   3. Bilateral leg weakness   4. Gait abnormality      Plan  Frequent falls and leg weakness: Suspect worsening lumbar spinal stenosis as cause.  She does have a pacemaker.  Therefore will refer to neurosurgery for their input.  Not sure if she can have an MRI or if it would be helpful.  In the meantime , supervised ambulation and the use of a wheelchair as needed.  Follow up:  as scheduled 05/30/2022  Orders Placed This Encounter  Procedures   Ambulatory referral to Neurosurgery   No orders of the defined types were placed in this encounter.     I reviewed the patients updated PMH, FH, and SocHx.    Patient Active Problem List   Diagnosis Date Noted   Age-related osteoporosis with current pathological fracture 02/06/2021     Priority: High   SSS (sick sinus syndrome) (HCC) 02/11/2019    Priority: High   Status post placement of cardiac pacemaker 07/08/2018    Priority: High   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    Priority: High   Nontoxic multinodular goiter 09/08/2015    Priority: High   Current use of long term anticoagulation 05/16/2015    Priority: High   (HFpEF) heart failure with preserved ejection fraction (HCC) 09/13/2014    Priority: High   Pacemaker 11/09/2011    Priority: High   Impaired fasting glucose 05/23/2009    Priority: High   Long term current use of systemic steroids 03/30/2008    Priority: High   Polymyalgia rheumatica (HCC) 03/30/2008    Priority: High   Benign essential hypertension 05/05/2007    Priority: High   Persistent atrial fibrillation (HCC) 11/06/2006    Priority: High   Pure hypercholesterolemia 11/06/2006    Priority: High   Hypothyroidism due to Hashimoto's thyroiditis, Rx Levothyroxine 06/25/2005    Priority: High   Primary osteoarthritis of right knee 10/23/2019    Priority: Medium    Nonrheumatic mitral valve regurgitation 09/16/2015    Priority: Medium    Recurrent UTI 03/14/2015    Priority: Medium    Neuralgia, postherpetic 07/02/2019    Priority: Low   OAB (overactive bladder) 07/02/2019    Priority: Low   Constipation 01/18/2019  Priority: Low   Encounter to establish care 01/14/2022   Sensorineural hearing loss (SNHL) of both ears 01/14/2022   Bilateral impacted cerumen 01/14/2022   Hashimoto's thyroiditis 06/15/2021   History of vitamin D deficiency 06/15/2021   Frequent falls 05/17/2021   Compression fracture of body of thoracic vertebra (HCC) 02/06/2021   Shellfish allergy 11/22/2015   Synovial cyst of popliteal space 11/22/2008   Current Meds  Medication Sig   acetaminophen (TYLENOL) 500 MG tablet Take 500 mg by mouth every 6 (six) hours as needed for mild pain or headache.   apixaban (ELIQUIS) 2.5 MG TABS tablet Take 1 tablet  (2.5 mg total) by mouth 2 (two) times daily.   cephALEXin (KEFLEX) 500 MG capsule Take 1 capsule (500 mg total) by mouth 2 (two) times daily.   Cholecalciferol (VITAMIN D3) 25 MCG (1000 UT) CAPS Take 1 capsule by mouth daily.   digoxin (LANOXIN) 0.125 MG tablet TAKE 1 TABLET BY MOUTH DAILY   dorzolamide-timolol (COSOPT) 22.3-6.8 MG/ML ophthalmic solution Place 1 drop into both eyes 2 (two) times daily.   hydrochlorothiazide (HYDRODIURIL) 25 MG tablet TAKE 1 TABLET BY MOUTH  DAILY   latanoprost (XALATAN) 0.005 % ophthalmic solution Place 1 drop into both eyes at bedtime.   levothyroxine (SYNTHROID) 112 MCG tablet Take 1 tablet (112 mcg total) by mouth daily before breakfast.   methocarbamol (ROBAXIN) 500 MG tablet Take 1 tablet (500 mg total) by mouth every 8 (eight) hours as needed for muscle spasms.   metoprolol succinate (TOPROL-XL) 50 MG 24 hr tablet TAKE 1 TABLET BY MOUTH  DAILY   Nitroglycerin 0.4 % OINT Apply 1 inch intra-anally twice a day for 2 weeks   oxyCODONE (ROXICODONE) 5 MG immediate release tablet Take 0.5 tablets (2.5 mg total) by mouth 3 (three) times daily as needed for severe pain. AFTER EATING.   predniSONE (DELTASONE) 1 MG tablet TAKE 1 TABLET BY MOUTH  DAILY    Allergies: Patient is allergic to shellfish-derived products, epinephrine, monosodium glutamate, ciprofloxacin, erythromycin, monosodium glutamate, sulfa antibiotics, sulfamethoxazole-trimethoprim, and tetracyclines & related. Family History: Patient family history includes Cirrhosis (age of onset: 43) in her father; Early death (age of onset: 35) in her mother; Hypertension in her brother; Lung cancer (age of onset: 80) in her brother; Scleroderma in her daughter. Social History:  Patient  reports that she has quit smoking. Her smoking use included cigarettes. She has never used smokeless tobacco. She reports that she does not currently use alcohol. She reports that she does not currently use drugs.  Review of  Systems: Constitutional: Negative for fever malaise or anorexia Cardiovascular: negative for chest pain Respiratory: negative for SOB or persistent cough Gastrointestinal: negative for abdominal pain  Objective  Vitals: BP 138/78   Pulse 60   Temp 98.5 F (36.9 C)   Ht 5\' 4"  (1.626 m)   Wt 129 lb (58.5 kg)   SpO2 97%   BMI 22.14 kg/m  General: no acute distress , A&Ox3, sitting in wheelchair   Commons side effects, risks, benefits, and alternatives for medications and treatment plan prescribed today were discussed, and the patient expressed understanding of the given instructions. Patient is instructed to call or message via MyChart if he/she has any questions or concerns regarding our treatment plan. No barriers to understanding were identified. We discussed Red Flag symptoms and signs in detail. Patient expressed understanding regarding what to do in case of urgent or emergency type symptoms.  Medication list was reconciled, printed and provided to  the patient in AVS. Patient instructions and summary information was reviewed with the patient as documented in the AVS. This note was prepared with assistance of Dragon voice recognition software. Occasional wrong-word or sound-a-like substitutions may have occurred due to the inherent limitations of voice recognition software  This visit occurred during the SARS-CoV-2 public health emergency.  Safety protocols were in place, including screening questions prior to the visit, additional usage of staff PPE, and extensive cleaning of exam room while observing appropriate contact time as indicated for disinfecting solutions.

## 2022-05-16 ENCOUNTER — Ambulatory Visit: Payer: Medicare Other | Admitting: Neurology

## 2022-05-17 NOTE — Progress Notes (Signed)
Remote pacemaker transmission.   

## 2022-05-21 ENCOUNTER — Telehealth: Payer: Self-pay

## 2022-05-21 NOTE — Telephone Encounter (Signed)
        Patient  visited [facility] on 05/11/2022  for Unspecified fall, initial encounter.   Telephone encounter attempt :  1st  A HIPAA compliant voice message was left requesting a return call.  Instructed patient to call back at [phone].   [place] Resource Care Guide   [email or web address]  ?? [phone]   Website: [email or web address]

## 2022-05-22 ENCOUNTER — Telehealth: Payer: Self-pay

## 2022-05-22 NOTE — Telephone Encounter (Signed)
     Patient  visit on 05/11/2022  at [facility] was for Unspecified fall, initial encounter.  Have you been able to follow up with your primary care physician? Yes  The patient was or was not able to obtain any needed medicine or equipment. Patient has filled medications.  Are there diet recommendations that you are having difficulty following? No  Patient expresses understanding of discharge instructions and education provided has no other needs at this time.    [place] Resource Care Guide   ??[email or web address]  ?? [phone]   Website: [email or web address]  [email or web address]

## 2022-05-23 ENCOUNTER — Ambulatory Visit: Payer: Medicare Other | Admitting: Family Medicine

## 2022-05-30 ENCOUNTER — Ambulatory Visit (INDEPENDENT_AMBULATORY_CARE_PROVIDER_SITE_OTHER): Payer: Medicare Other | Admitting: Family Medicine

## 2022-05-30 ENCOUNTER — Encounter: Payer: Self-pay | Admitting: Family Medicine

## 2022-05-30 VITALS — BP 134/70 | HR 59 | Temp 97.6°F | Ht 64.0 in | Wt 128.0 lb

## 2022-05-30 DIAGNOSIS — R1310 Dysphagia, unspecified: Secondary | ICD-10-CM

## 2022-05-30 DIAGNOSIS — R633 Feeding difficulties, unspecified: Secondary | ICD-10-CM | POA: Diagnosis not present

## 2022-05-30 DIAGNOSIS — M48061 Spinal stenosis, lumbar region without neurogenic claudication: Secondary | ICD-10-CM | POA: Insufficient documentation

## 2022-05-30 DIAGNOSIS — R221 Localized swelling, mass and lump, neck: Secondary | ICD-10-CM

## 2022-05-30 NOTE — Patient Instructions (Signed)
Please return in 3 months for recheck.  I have ordered a neck CT to evaluate the neck mass and a swallowing study to evaluate your swallowing. We will call you with appointments.   If you have any questions or concerns, please don't hesitate to send me a message via MyChart or call the office at [phone]. Thank you for visiting with [place] today! It's our pleasure caring for you.

## 2022-05-30 NOTE — Progress Notes (Signed)
Subjective  CC:  Chief Complaint  Patient presents with   Mass    Pt stated that she has a lump on the left side of her neck along with some other issues/concerns    HPI: Deanna Rivera is a 88 y.o. female who presents to the office today to address the problems listed above in the chief complaint. Problems swallowing: Patient has not noted that food is getting stuck in her throat.  Choked and oriented the other day.  Husband had to give her the Heimlich.  She also reports that she has to clear her throat often in order to get food down.  Her husband concurs, she has had difficulty swallowing for several years but might be getting worse.  Patient denies pain.  No history of aspiration pneumonia.  No weight loss. Left neck mass noted.  She is not sure how long it has been there.  Firm left neck mass that is nontender.  She is not sure if it is related to her swallowing problems. Lower extremity weakness, bilaterally and difficulty with gait.  Continue to work with PT.  In the wheelchair most of the time at this point.  Refer to neurosurgery.  Assessment  1. Swallowing dysfunction   2. Mass of left side of neck   3. Degenerative lumbar spinal stenosis   4. Feeding difficulties, unspecified      Plan  Swallowing dysfunction: Modified barium swallow to assess swallowing function.  May need to modify diet.  Discussed risk of aspiration. Left firm mobile neck mass: New finding.  CT of the neck for further evaluation. DJD spine and lumbar stenosis: Bilateral lower extremity weakness.  Would like neurosurgical input on potential for improvement in treatment.  Patient to schedule appointment.  Dr. Christella Noa or Arnoldo Morale.  Follow up: 3 months to recheck. 07/24/2022  Orders Placed This Encounter  Procedures   CT Soft Tissue Neck W Contrast   SLP modified barium swallow   No orders of the defined types were placed in this encounter.     I reviewed the patients updated PMH, FH, and SocHx.     Patient Active Problem List   Diagnosis Date Noted   Age-related osteoporosis with current pathological fracture 02/06/2021    Priority: High   SSS (sick sinus syndrome) ([place]) 02/11/2019    Priority: High   Status post placement of cardiac pacemaker 07/08/2018    Priority: High   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    Priority: High   Nontoxic multinodular goiter 09/08/2015    Priority: High   Current use of long term anticoagulation 05/16/2015    Priority: High   (HFpEF) heart failure with preserved ejection fraction ([place]) 09/13/2014    Priority: High   Pacemaker 11/09/2011    Priority: High   Impaired fasting glucose 05/23/2009    Priority: High   Long term current use of systemic steroids 03/30/2008    Priority: High   Polymyalgia rheumatica ([place]) 03/30/2008    Priority: High   Benign essential hypertension 05/05/2007    Priority: High   Persistent atrial fibrillation ([place]) 11/06/2006    Priority: High   Pure hypercholesterolemia 11/06/2006    Priority: High   Hypothyroidism due to Hashimoto's thyroiditis, Rx Levothyroxine 06/25/2005    Priority: High   Primary osteoarthritis of right knee 10/23/2019    Priority: Medium    Nonrheumatic mitral valve regurgitation 09/16/2015    Priority: Medium    Recurrent UTI 03/14/2015    Priority: Medium  Neuralgia, postherpetic 07/02/2019    Priority: Low   OAB (overactive bladder) 07/02/2019    Priority: Low   Constipation 01/18/2019    Priority: Low   Degenerative lumbar spinal stenosis 05/30/2022   Encounter to establish care 01/14/2022   Sensorineural hearing loss (SNHL) of both ears 01/14/2022   Bilateral impacted cerumen 01/14/2022   Hashimoto's thyroiditis 06/15/2021   History of vitamin D deficiency 06/15/2021   Frequent falls 05/17/2021   Compression fracture of body of thoracic vertebra (HCC) 02/06/2021   Shellfish allergy 11/22/2015   Synovial cyst of popliteal space 11/22/2008   Current Meds  Medication  Sig   acetaminophen (TYLENOL) 500 MG tablet Take 500 mg by mouth every 6 (six) hours as needed for mild pain or headache.   apixaban (ELIQUIS) 2.5 MG TABS tablet Take 1 tablet (2.5 mg total) by mouth 2 (two) times daily.   Cholecalciferol (VITAMIN D3) 25 MCG (1000 UT) CAPS Take 1 capsule by mouth daily.   digoxin (LANOXIN) 0.125 MG tablet TAKE 1 TABLET BY MOUTH DAILY   dorzolamide-timolol (COSOPT) 22.3-6.8 MG/ML ophthalmic solution Place 1 drop into both eyes 2 (two) times daily.   hydrochlorothiazide (HYDRODIURIL) 25 MG tablet TAKE 1 TABLET BY MOUTH  DAILY   latanoprost (XALATAN) 0.005 % ophthalmic solution Place 1 drop into both eyes at bedtime.   levothyroxine (SYNTHROID) 112 MCG tablet Take 1 tablet (112 mcg total) by mouth daily before breakfast.   metoprolol succinate (TOPROL-XL) 50 MG 24 hr tablet TAKE 1 TABLET BY MOUTH  DAILY   Nitroglycerin 0.4 % OINT Apply 1 inch intra-anally twice a day for 2 weeks   predniSONE (DELTASONE) 1 MG tablet TAKE 1 TABLET BY MOUTH  DAILY    Allergies: Patient is allergic to shellfish-derived products, epinephrine, monosodium glutamate, ciprofloxacin, erythromycin, monosodium glutamate, sulfa antibiotics, sulfamethoxazole-trimethoprim, and tetracyclines & related. Family History: Patient family history includes Cirrhosis (age of onset: 93) in her father; Early death (age of onset: 32) in her mother; Hypertension in her brother; Lung cancer (age of onset: 13) in her brother; Scleroderma in her daughter. Social History:  Patient  reports that she has quit smoking. Her smoking use included cigarettes. She has never used smokeless tobacco. She reports that she does not currently use alcohol. She reports that she does not currently use drugs.  Review of Systems: Constitutional: Negative for fever malaise or anorexia Cardiovascular: negative for chest pain Respiratory: negative for SOB or persistent cough Gastrointestinal: negative for abdominal  pain  Objective  Vitals: BP 134/70   Pulse (!) 59   Temp 97.6 F (36.4 C)   Ht '5\' 4"'$  (1.626 m)   Wt 128 lb (58.1 kg)   SpO2 97%   BMI 21.97 kg/m  General: no acute distress , A&Ox3, comfortable in wheelchair HEENT: Left subcentimeter firm mobile neck mass, no lymphadenopathy. Cardiovascular:  RRR without murmur or gallop.  Respiratory:  Good breath sounds bilaterally, CTAB with normal respiratory effort Skin:  Warm, no rashes    Commons side effects, risks, benefits, and alternatives for medications and treatment plan prescribed today were discussed, and the patient expressed understanding of the given instructions. Patient is instructed to call or message via MyChart if he/she has any questions or concerns regarding our treatment plan. No barriers to understanding were identified. We discussed Red Flag symptoms and signs in detail. Patient expressed understanding regarding what to do in case of urgent or emergency type symptoms.  Medication list was reconciled, printed and provided to the patient in  AVS. Patient instructions and summary information was reviewed with the patient as documented in the AVS. This note was prepared with assistance of Dragon voice recognition software. Occasional wrong-word or sound-a-like substitutions may have occurred due to the inherent limitations of voice recognition software  This visit occurred during the SARS-CoV-2 public health emergency.  Safety protocols were in place, including screening questions prior to the visit, additional usage of staff PPE, and extensive cleaning of exam room while observing appropriate contact time as indicated for disinfecting solutions.

## 2022-06-01 ENCOUNTER — Telehealth (HOSPITAL_COMMUNITY): Payer: Self-pay

## 2022-06-01 NOTE — Telephone Encounter (Signed)
Called and spoke with patient to schedule Modified Barium Swallow. Patient stated she wants this test done the same day as her CT scan. CT scan is not scheduled at this time. I informed patient that we can not guarantee we will have availability or same time once CT is scheduled. Patient will call our office once appointment is scheduled. Will continue to follow.

## 2022-06-07 ENCOUNTER — Other Ambulatory Visit: Payer: Self-pay | Admitting: Family Medicine

## 2022-06-11 ENCOUNTER — Ambulatory Visit: Payer: Medicare Other | Admitting: Neurology

## 2022-06-20 ENCOUNTER — Telehealth: Payer: Self-pay | Admitting: Pharmacist

## 2022-06-20 NOTE — Progress Notes (Signed)
Erroneous Encounter

## 2022-06-27 ENCOUNTER — Telehealth (HOSPITAL_COMMUNITY): Payer: Self-pay

## 2022-06-27 NOTE — Telephone Encounter (Signed)
Attempted to contact patient to schedule Modified Barium Swallow - left voicemail. 

## 2022-06-28 ENCOUNTER — Other Ambulatory Visit: Payer: Self-pay | Admitting: Neurosurgery

## 2022-06-28 DIAGNOSIS — S22070A Wedge compression fracture of T9-T10 vertebra, initial encounter for closed fracture: Secondary | ICD-10-CM

## 2022-07-05 ENCOUNTER — Other Ambulatory Visit: Payer: Self-pay | Admitting: Internal Medicine

## 2022-07-10 ENCOUNTER — Other Ambulatory Visit: Payer: Self-pay

## 2022-07-11 ENCOUNTER — Ambulatory Visit (HOSPITAL_COMMUNITY)
Admission: RE | Admit: 2022-07-11 | Discharge: 2022-07-11 | Disposition: A | Discharge status: Home / Self Care | Payer: Medicare Other | Source: Ambulatory Visit | Attending: Neurosurgery | Admitting: Neurosurgery

## 2022-07-11 DIAGNOSIS — S22070A Wedge compression fracture of T9-T10 vertebra, initial encounter for closed fracture: Secondary | ICD-10-CM | POA: Insufficient documentation

## 2022-07-17 ENCOUNTER — Ambulatory Visit: Payer: Self-pay | Admitting: Family

## 2022-07-17 NOTE — Progress Notes (Signed)
Chronic Care Management Pharmacy Note  08/25/2021 Name:  Celsa Nordahl MRN:  [id number] DOB:  [birth year]  Summary: PharmD FU visit.  BP still fluctuating.  She takes extra tab of Toprol when it is high and this works.  Cardiology ok'd this.  Has taken lisinopril in the past.  NO recent falls.  Still no DEXA or treatment on file.  Recommendations/Changes made from today's visit: Consider osteoporosis treatment considering fall/fracture risk  Plan: FU 4 months   Subjective: Kyndle Schlender is an 88 y.o. year old female who is a primary patient of [name], MD.  The CCM team was consulted for assistance with disease management and care coordination needs.    Engaged with patient by telephone for follow up visit in response to provider referral for pharmacy case management and/or care coordination services.   Consent to Services:  The patient was given the following information about Chronic Care Management services today, agreed to services, and gave verbal consent: 1. CCM service includes personalized support from designated clinical staff supervised by the primary care provider, including individualized plan of care and coordination with other care providers 2. 24/7 contact phone numbers for assistance for urgent and routine care needs. 3. Service will only be billed when office clinical staff spend 20 minutes or more in a month to coordinate care. 4. Only one practitioner may furnish and bill the service in a calendar month. 5.The patient may stop CCM services at any time (effective at the end of the month) by phone call to the office staff. 6. The patient will be responsible for cost sharing (co-pay) of up to 20% of the service fee (after annual deductible is met). Patient agreed to services and consent obtained.  Patient Care Team: [name], MD as PCP - General (Family Medicine) Thompson Grayer, MD as PCP - Electrophysiology (Cardiology) Thompson Grayer, MD as Consulting  Physician (Cardiology) Georgia Lopes as Physician Assistant (Orthopedic Surgery) Hermelinda Medicus, MD as Consulting Physician (Rheumatology) [name], MD (Family Medicine) Edythe Clarity, [facility] as Pharmacist (Pharmacist)  Recent office visits:  05/17/2021 OV (PCP) [name], MD; no medication changes indicated.   04/19/2021 OV Allwardt, Alyssa M, PA-C; no medication changes indicated.   03/08/2021 OV (PCP) [name], MD; Please stop calcitonin spray.   Recent consult visits:  08/16/2021 OV Gertie Fey) Daryel November, MD; metamucil daily, Loperamide PRN   07/05/2021 OV (Cardiology) Thompson Grayer, MD; On pradaxa Given advanced age, I have advised that she switch to eliquis 2.54m BID She is willing to switch but wishes to wait until after she uses up her current pradaxa supply.   06/15/2021 OV (Endocrinology) Shamleffer, IMelanie Crazier MD; Pt declines DXA scan - had vertigo with the last one , no medication changes indicated.   04/04/2021 OV (Neurosurgery) MLeisa LenzA, DO; no medication changes indicated.   Hospital visits:  08/09/2021 ED visit for Fall -No medication changes indicated.   05/12/2021 ED visit for Fall -She does have evidence of a nasal bone fracture. -No medication changes indicated.     Objective:  Lab Results  Component Value Date   CREATININE 0.34 (L) 08/09/2021   BUN 20 08/09/2021   GFR 189.54 10/27/2019   GFRNONAA >60 08/09/2021   GFRAA 116 07/28/2020   NA 136 08/09/2021   K 3.7 08/09/2021   CALCIUM 10.2 08/09/2021   CO2 31 08/09/2021   GLUCOSE 104 (H) 08/09/2021    Lab Results  Component Value Date/Time  HGBA1C 5.8 (H) 07/28/2020 11:31 AM   HGBA1C 5.8 10/27/2019 10:10 AM   GFR 189.54 10/27/2019 10:10 AM   GFR 177.35 03/19/2019 01:48 PM    Last diabetic Eye exam: No results found for: HMDIABEYEEXA  Last diabetic Foot exam: No results found for: HMDIABFOOTEX   Lab Results  Component Value Date    CHOL 202 (H) 07/28/2020   HDL 66 07/28/2020   LDLCALC 117 (H) 07/28/2020   TRIG 87 07/28/2020   CHOLHDL 3.1 07/28/2020    Hepatic Function Latest Ref Rng & Units 05/12/2021 01/21/2021 07/28/2020  Total Protein 6.5 - 8.1 g/dL 7.5 7.6 7.5  Albumin 3.5 - 5.0 g/dL 3.6 3.4(L) -  AST 15 - 41 U/L 65(H) 44(H) 34  ALT 0 - 44 U/L 43 30 29  Alk Phosphatase 38 - 126 U/L 54 52 -  Total Bilirubin 0.3 - 1.2 mg/dL 0.5 1.0 0.5  Bilirubin, Direct 0.0 - 0.2 mg/dL - 0.3(H) -    Lab Results  Component Value Date/Time   TSH 1.21 06/15/2021 11:34 AM   TSH 1.85 07/28/2020 11:31 AM   FREET4 1.46 03/19/2019 01:48 PM    CBC Latest Ref Rng & Units 08/09/2021 05/12/2021 05/12/2021  WBC 4.0 - 10.5 K/uL 7.2 6.3 -  Hemoglobin 12.0 - 15.0 g/dL 13.8 13.5 14.3  Hematocrit 36.0 - 46.0 % 42.1 41.5 42.0  Platelets 150 - 400 K/uL 224 227 -    Lab Results  Component Value Date/Time   VD25OH 25.86 (L) 06/15/2021 11:34 AM   VD25OH 34.95 10/27/2019 10:10 AM    Clinical ASCVD: No  The ASCVD Risk score (Arnett DK, et al., 2019) failed to calculate for the following reasons:   The 2019 ASCVD risk score is only valid for ages 35 to 63    Depression screen PHQ 2/9 03/08/2021 07/28/2020 11/12/2019  Decreased Interest 3 0 0  Down, Depressed, Hopeless 3 0 0  PHQ - 2 Score 6 0 0  Altered sleeping 0 3 -  Tired, decreased energy 2 3 -  Change in appetite 0 0 -  Feeling bad or failure about yourself  3 0 -  Trouble concentrating 0 0 -  Moving slowly or fidgety/restless 0 0 -  Suicidal thoughts 0 0 -  PHQ-9 Score 11 6 -  Difficult doing work/chores Very difficult - -     Social History   Tobacco Use  Smoking Status Former   Types: Cigarettes  Smokeless Tobacco Never   BP Readings from Last 3 Encounters:  08/16/21 140/72  08/09/21 (!) 165/76  07/05/21 134/70   Pulse Readings from Last 3 Encounters:  08/16/21 60  08/09/21 71  07/05/21 (!) 2   Wt Readings from Last 3 Encounters:  08/09/21 132 lb (59.9 kg)   07/05/21 131 lb (59.4 kg)  06/15/21 132 lb (59.9 kg)   BMI Readings from Last 3 Encounters:  08/09/21 23.38 kg/m  07/05/21 22.49 kg/m  06/15/21 22.66 kg/m    Assessment/Interventions: Review of patient past medical history, allergies, medications, health status, including review of consultants reports, laboratory and other test data, was performed as part of comprehensive evaluation and provision of chronic care management services.   SDOH:  (Social Determinants of Health) assessments and interventions performed: No, done within the last year  Financial Resource Strain: Low Risk  (11/14/2021)   Overall Financial Resource Strain (CARDIA)    Difficulty of Paying Living Expenses: Not hard at all   Food Insecurity: No Food Insecurity (04/11/2022)   Hunger  Vital Sign    Worried About Charity fundraiser in the Last Year: Never true    Ran Out of Food in the Last Year: Never true    SDOH Screenings   Alcohol Screen: Not on file  Depression (PHQ2-9): Medium Risk   PHQ-2 Score: 11  Financial Resource Strain: Not on file  Food Insecurity: Not on file  Housing: Not on file  Physical Activity: Not on file  Social Connections: Not on file  Stress: Not on file  Tobacco Use: Medium Risk   Smoking Tobacco Use: Former   Smokeless Tobacco Use: Never   Passive Exposure: Not on file  Transportation Needs: Not on file    [place]  Allergies  Allergen Reactions   Shellfish-Derived Products Shortness Of Breath and Other (See Comments)    Lips go numb   Epinephrine Other (See Comments) and Hypertension    "Makes me feel wired"   Monosodium Glutamate Other (See Comments)    other   Ciprofloxacin Other (See Comments)    Allergic reaction not recalled   Erythromycin Other (See Comments)    Allergic reaction not recalled   Monosodium Glutamate Other (See Comments) and Hypertension    "Makes me feel badly"   Sulfa Antibiotics Other (See Comments)    Allergic reaction not recalled    Sulfamethoxazole-Trimethoprim    Tetracyclines & Related Other (See Comments)    Allergic reaction not recalled    Medications Reviewed Today     Reviewed by Daryel November, MD (Physician) on 08/21/21 at [phone]  Med List Status: <None>   Medication Order Taking? Sig Documenting Provider Last Dose Status Informant  acetaminophen (TYLENOL) 500 MG tablet [id number] Yes Take 500 mg by mouth every 6 (six) hours as needed for mild pain or headache. [provider] Taking Active Self  apixaban (ELIQUIS) 2.5 MG TABS tablet [id number] No Take 1 tablet (2.5 mg total) by mouth 2 (two) times daily.  Patient not taking: Reported on 08/16/2021   Thompson Grayer, MD Not Taking Active   Cholecalciferol (VITAMIN D3) 25 MCG (1000 UT) CAPS [id number] Yes Take 1 capsule by mouth daily. [provider] Taking Active   digoxin (LANOXIN) 0.125 MG tablet [id number] Yes TAKE 1 TABLET BY MOUTH  DAILY Allred, James, MD Taking Active   dorzolamide-timolol (COSOPT) 22.3-6.8 MG/ML ophthalmic solution [id number] Yes Place 1 drop into both eyes 2 (two) times daily. [provider] Taking Active Self  hydrochlorothiazide (HYDRODIURIL) 25 MG tablet [id number] Yes TAKE 1 TABLET BY MOUTH  DAILY  Patient taking differently: Take 12.5 mg by mouth daily.   [name], MD Taking Active   latanoprost (XALATAN) 0.005 % ophthalmic solution [id number] Yes Place 1 drop into both eyes at bedtime. [provider] Taking Active Self  levothyroxine (SYNTHROID) 112 MCG tablet [id number] Yes TAKE 1 TABLET BY MOUTH  DAILY  Patient taking differently: Take 112 mcg by mouth daily before breakfast.   [name], MD Taking Active   metoprolol succinate (TOPROL-XL) 50 MG 24 hr tablet [id number] Yes TAKE 1 TABLET BY MOUTH  DAILY [name], MD Taking Active   predniSONE (DELTASONE) 1 MG tablet [id number]  TAKE 1 TABLET BY MOUTH  DAILY [name], MD  Active             Patient Active Problem  List   Diagnosis Date Noted   Hashimoto's thyroiditis 06/15/2021   History of vitamin D deficiency 06/15/2021   Frequent falls 05/17/2021  Age-related osteoporosis with current pathological fracture 02/06/2021   Compression fracture of body of thoracic vertebra ([place]) 02/06/2021   Primary osteoarthritis of right knee 10/23/2019   Neuralgia, postherpetic 07/02/2019   OAB (overactive bladder) 07/02/2019   SSS (sick sinus syndrome) ([place]) 02/11/2019   Constipation 01/18/2019   Status post placement of cardiac pacemaker 07/08/2018   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   Shellfish allergy 11/22/2015   Nonrheumatic mitral valve regurgitation 09/16/2015   Nontoxic multinodular goiter 09/08/2015   Current use of long term anticoagulation 05/16/2015   Recurrent UTI 03/14/2015   (HFpEF) heart failure with preserved ejection fraction ([place]) 09/13/2014   Pacemaker 11/09/2011   Impaired fasting glucose 05/23/2009   Synovial cyst of popliteal space 11/22/2008   Long term current use of systemic steroids 03/30/2008   Polymyalgia rheumatica ([place]) 03/30/2008   Benign essential hypertension 05/05/2007   Persistent atrial fibrillation ([place]) 11/06/2006   Pure hypercholesterolemia 11/06/2006   Hypothyroidism due to Hashimoto's thyroiditis, Rx Levothyroxine 06/25/2005    Immunization History  Administered Date(s) Administered   Fluad Quad(high Dose 65+) 05/13/2020   Moderna Sars-Covid-2 Vaccination 08/12/2019, 09/09/2019, 06/14/2020   Pneumococcal Conjugate-13 04/28/2019   Tdap 05/12/2021    Conditions to be addressed/monitored:  HFpEF, HTN, Afib, Hypothyroidism, HLD, Osteoporosis  Patient Care Plan: General Pharmacy (Adult)     Problem Identified: HFpEF, HTN, Afib, Hypothyroidism, HLD, Osteoporosis   Priority: High  Onset Date: 09/12/2021     Long-Range Goal: Patient-Specific Goal   Start Date: 09/12/2021  Expected End Date: 03/12/2022  Recent Progress: On track  Priority: High  Note:    Current Barriers:  Unable to maintain control of BP Does not adhere to prescribed medication regimen  Pharmacist Clinical Goal(s):  Patient will achieve improvement in BP as evidenced by monitoring adhere to prescribed medication regimen as evidenced by fill dates through collaboration with PharmD and provider.   Interventions: 1:1 collaboration with [name], MD regarding development and update of comprehensive plan of care as evidenced by provider attestation and co-signature Inter-disciplinary care team collaboration (see longitudinal plan of care) Comprehensive medication review performed; medication list updated in electronic medical record  Hypertension (BP goal <140/80) 07/24/22 -Not ideally controlled, last BP in office above goal -Current treatment: HCTZ 5m daily one half tablet daily Appropriate, Query effective Metoprolol XL 532mAppropriate, Query effective, ,  -Medications previously tried: lisinopril?  -Current home readings: no logs today, BP continues to fluctuate -Denies hypotensive/hypertensive symptoms -Educated on BP goals and benefits of medications for prevention of heart attack, stroke and kidney damage; Exercise goal of 150 minutes per week; Importance of home blood pressure monitoring; Symptoms of hypotension and importance of maintaining adequate hydration; -Counseled to monitor BP at home daily x 2 weeks, document, and provide log at future appointments -Recommended to continue current medication -Continue to monitor, she reports she takes extra dose of Metoprolol if her BP is high and this works to bring it down.  History of fluctuating BP.  Continue to monitor, she denies dizziness when she takes extra dose.  High fall risk so cautious to control BP too tightly.   Hyperlipidemia: (LDL goal < 70) -Uncontrolled -Current treatment: None -Medications previously tried: none noted  -Educated on Cholesterol goals;  Benefits of statin for ASCVD risk  reduction; Importance of limiting foods high in cholesterol; -Recheck lipids at upcoming OV, Afib increased risk of stroke. LDL back in 2021 was elevated. Consider patient preference and upcoming labs to determine if statin is necessary.  Atrial Fibrillation (  Goal: prevent stroke and major bleeding) -Controlled, not assessed -Current treatment: Rate control: Metoprolol, XL 90m daily Appropriate, Effective, Safe, Accessible Digoxin 0.1221mdaily Appropriate, Effective, Safe, Accessible Anticoagulation: Eliquis 2.5 mg BID Appropriate, Effective, Safe, Accessible -Counseled on increased risk of stroke due to Afib and benefits of anticoagulation for stroke prevention; importance of adherence to anticoagulant exactly as prescribed; -Recommended to continue current medication She mentions sometimes she takes extra Toprolol when BP is high. Counseled on effect on HR and increasing fall risk. Check BP at upcoming OV - may need additional BP control to avoid extra doses of other meds.  Heart Failure (Goal: manage symptoms and prevent exacerbations) -Controlled, not assessed -Last ejection fraction: 55% -Current treatment: Metoprolol XL 5045maily Appropriate, Effective, Safe, Accessible -Medications previously tried: none noted  -Current home BP/HR readings: no logs today -Educated on Benefits of medications for managing symptoms and prolonging life Importance of weighing daily; if you gain more than 3 pounds in one day or 5 pounds in one week, contact providers Importance of blood pressure control -Recommended to continue current medication She does mention some slight swelling in feet and ankles, knows to contact us [place] it gets worse.  Osteoporosis (Goal Prevent falls/fractures) 07/24/22 -Not ideally controlled, declines DEXA -Last DEXA Scan: unknown - patient refused previously  -Patient is a candidate for pharmacologic treatment due to history of vertebral fracture -Current treatment   None -Medications previously tried: none  -Recommend [phone] units of vitamin D daily. Recommend 1200 mg of calcium daily from dietary and supplemental sources. Recommend weight-bearing and muscle strengthening exercises for building and maintaining bone density.  -Patient reports bones are strong. -She has not gotten DEXA, no changes at this time. -Please be careful and address fall risk or anything at the house.  Hypothyroidism (Goal: Maintain TSH) -Controlled -Current treatment  Levothyroxine 112m75maily Appropriate, Effective, Safe, Accessible -Medications previously tried: none noted -Takes appropriately  -Recommended to continue current medication  Patient Goals/Self-Care Activities Patient will:  - take medications as prescribed as evidenced by patient report and record review check blood pressure daily, document, and provide at future appointments  Follow Up Plan: The care management team will reach out to the patient again over the next 120 days.            Medication Assistance: None required.  Patient affirms current coverage meets needs.  Compliance/Adherence/Medication fill history: Care Gaps: DEXA - fu with PCP in two weeks Pneumonia vaccine  Star-Rating Drugs: N/A  Patient's preferred pharmacy is:  [facility] [id number] [place] -[place]: [phone]: [phone] ([facility]: [phone]: [phone] [facility] Evaluation And Treatment Centerivery ([facility] ) - [place] -[place]: [phone]: [phone] pill box? Yes Pt endorses 100% compliance  We discussed: Benefits of medication synchronization, packaging and delivery as well as enhanced pharmacist  oversight with [employer]. Patient decided to: Continue current medication management strategy  Care Plan and Follow Up Patient Decision:  Patient agrees to Care Plan and Follow-up.  Plan: The care management team will reach out to the patient again over the next 30 days.  ChriBeverly MilcharmD Clinical Pharmacist  [phone]

## 2022-07-19 ENCOUNTER — Telehealth: Payer: Self-pay | Admitting: Pharmacist

## 2022-07-19 NOTE — Progress Notes (Signed)
Chronic Care Management Pharmacy Assistant   Name: Daesha Insco  MRN: [id number] DOB: [birth year]   Reason for Encounter: Hypertension Adherence Call    Recent office visits:  05/30/2022 OV (PCP) [name], MD; no medication changes indicated.  05/15/2022 OV (PCP) [name], MD; no medication changes indicated.  05/03/2022 OV (Fam Med) Inda Coke, PA; Treat with keflex 500 mg bid x 7 days.  04/12/2022 OV (PCP) [name], MD;  no medication changes indicated.  Recent consult visits:  None  Hospital visits:  05/11/2022 ED visit for Fall  -No medication changes indicated.  Medications: Outpatient Encounter Medications as of 07/19/2022  Medication Sig   acetaminophen (TYLENOL) 500 MG tablet Take 500 mg by mouth every 6 (six) hours as needed for mild pain or headache.   apixaban (ELIQUIS) 2.5 MG TABS tablet Take 1 tablet (2.5 mg total) by mouth 2 (two) times daily.   cephALEXin (KEFLEX) 500 MG capsule Take 1 capsule (500 mg total) by mouth 2 (two) times daily. (Patient not taking: Reported on 05/30/2022)   Cholecalciferol (VITAMIN D3) 25 MCG (1000 UT) CAPS Take 1 capsule by mouth daily.   digoxin (LANOXIN) 0.125 MG tablet TAKE 1 TABLET BY MOUTH DAILY   dorzolamide-timolol (COSOPT) 22.3-6.8 MG/ML ophthalmic solution Place 1 drop into both eyes 2 (two) times daily.   hydrochlorothiazide (HYDRODIURIL) 25 MG tablet TAKE 1 TABLET BY MOUTH  DAILY   latanoprost (XALATAN) 0.005 % ophthalmic solution Place 1 drop into both eyes at bedtime.   levothyroxine (SYNTHROID) 112 MCG tablet Take 1 tablet (112 mcg total) by mouth daily before breakfast.   methocarbamol (ROBAXIN) 500 MG tablet Take 1 tablet (500 mg total) by mouth every 8 (eight) hours as needed for muscle spasms. (Patient not taking: Reported on 05/30/2022)   metoprolol succinate (TOPROL-XL) 50 MG 24 hr tablet TAKE 1 TABLET BY MOUTH DAILY   Nitroglycerin 0.4 % OINT Apply 1 inch intra-anally twice a day for 2  weeks   oxyCODONE (ROXICODONE) 5 MG immediate release tablet Take 0.5 tablets (2.5 mg total) by mouth 3 (three) times daily as needed for severe pain. AFTER EATING. (Patient not taking: Reported on 05/30/2022)   predniSONE (DELTASONE) 1 MG tablet TAKE 1 TABLET BY MOUTH  DAILY   No facility-administered encounter medications on file as of 07/19/2022.   Reviewed chart prior to disease state call. Spoke with patient regarding BP  Recent Office Vitals: BP Readings from Last 3 Encounters:  05/30/22 134/70  05/15/22 138/78  05/11/22 138/88   Pulse Readings from Last 3 Encounters:  05/30/22 (!) 59  05/15/22 60  05/11/22 68    Wt Readings from Last 3 Encounters:  05/30/22 128 lb (58.1 kg)  05/15/22 129 lb (58.5 kg)  05/03/22 128 lb (58.1 kg)     Kidney Function Lab Results  Component Value Date/Time   CREATININE 0.30 (L) 05/03/2022 10:34 AM   CREATININE 0.31 (L) 12/27/2021 03:11 PM   CREATININE 0.34 (L) 07/28/2020 11:31 AM   CREATININE 0.35 (L) 03/08/2020 11:55 AM   GFR 95.50 05/03/2022 10:34 AM   GFRNONAA >60 09/04/2021 07:40 PM   GFRNONAA 100 07/28/2020 11:31 AM   GFRAA 116 07/28/2020 11:31 AM       Latest Ref Rng & Units 05/03/2022   10:34 AM 12/27/2021    3:11 PM 09/04/2021    7:53 PM  BMP  Glucose 70 - 99 mg/dL 87  84  109   BUN 6 - 23 mg/dL 22  20  24   Creatinine 0.40 - 1.20 mg/dL 0.30  0.31  0.30   Sodium 135 - 145 mEq/L 137  137  134   Potassium 3.5 - 5.1 mEq/L 3.8  4.6  3.8   Chloride 96 - 112 mEq/L 100  100  96   CO2 19 - 32 mEq/L 31  30    Calcium 8.4 - 10.5 mg/dL 10.5  10.8      Current antihypertensive regimen:  Hydrochlorothiazide 25 mg daily Metoprolol Succinate 50 mg daily  How often are you checking your Blood Pressure? 1-2x per week  Current home BP readings: 180/120 on 07/17/2022  What recent interventions/DTPs have been made by any provider to improve Blood Pressure control since last CPP Visit: No recent interventions or DTPs.  Any recent  hospitalizations or ED visits since last visit with CPP? Yes  What diet changes have been made to improve Blood Pressure Control?  No recent diet changes per patient.  What exercise is being done to improve your Blood Pressure Control?  Patient states she doesn't have much energy to exercise.  Adherence Review: Is the patient currently on ACE/ARB medication? No Does the patient have >5 day gap between last estimated fill dates? No  Patient has an upcoming telephone visit with pharmacist to discuss blood pressure.  Care Gaps: Medicare Annual Wellness: Next due on 11/15/2022 Hemoglobin A1C: 5.8% on 07/28/2020  Future Appointments  Date Time Provider [place]  07/24/2022  9:45 AM [facility] CCM PHARMACIST [facility]  08/08/2022  8:15 AM [facility] DEVICE REMOTES [facility]  08/08/2022 11:10 AM Tomi Likens, Adam R, DO [facility] None  08/29/2022  2:30 PM [name], MD [facility]  09/11/2022  2:00 PM Evans Lance, MD [facility]  11/07/2022  8:15 AM [facility] DEVICE REMOTES [facility]  02/06/2023  8:15 AM [facility] DEVICE REMOTES [facility]  05/08/2023  8:15 AM [facility] DEVICE REMOTES [facility]  11/06/2023  8:15 AM [facility] DEVICE REMOTES [facility]   Star Rating Drugs: None  April D Calhoun, Media Pharmacist Assistant [phone]

## 2022-07-24 ENCOUNTER — Ambulatory Visit: Payer: Medicare Other | Admitting: Pharmacist

## 2022-07-24 DIAGNOSIS — I10 Essential (primary) hypertension: Secondary | ICD-10-CM

## 2022-07-24 DIAGNOSIS — M8000XD Age-related osteoporosis with current pathological fracture, unspecified site, subsequent encounter for fracture with routine healing: Secondary | ICD-10-CM

## 2022-07-24 NOTE — Patient Instructions (Addendum)
Visit Information   Goals Addressed             This Visit's Progress    Prevent Falls and Broken Bones-Osteoporosis   On track    Timeframe:  Long-Range Goal Priority:  High Start Date:  09/12/21                           Expected End Date: 03/12/22                       Follow Up Date 04/31/23    - always wear shoes or slippers with non-slip sole - keep cell phone with me always - make an emergency alert plan in case I fall - pick up clutter from the floors - remove, or use a non-slip pad, with my throw rugs    Why is this important?   When you fall, there are 3 things that control if a bone breaks or not.  These are the fall itself, how hard and the direction that you fall and how fragile your bones are.  Preventing falls is very important for you because of fragile bones.     Notes:        Patient Care Plan: General Pharmacy (Adult)     Problem Identified: HFpEF, HTN, Afib, Hypothyroidism, HLD, Osteoporosis   Priority: High  Onset Date: 09/12/2021     Long-Range Goal: Patient-Specific Goal   Start Date: 09/12/2021  Expected End Date: 03/12/2022  Recent Progress: On track  Priority: High  Note:   Current Barriers:  Unable to maintain control of BP Does not adhere to prescribed medication regimen  Pharmacist Clinical Goal(s):  Patient will achieve improvement in BP as evidenced by monitoring adhere to prescribed medication regimen as evidenced by fill dates through collaboration with PharmD and provider.   Interventions: 1:1 collaboration with [name], MD regarding development and update of comprehensive plan of care as evidenced by provider attestation and co-signature Inter-disciplinary care team collaboration (see longitudinal plan of care) Comprehensive medication review performed; medication list updated in electronic medical record  Hypertension (BP goal <140/80) 07/24/22 -Not ideally controlled, last BP in office above goal -Current  treatment: HCTZ '25mg'$  daily one half tablet daily Appropriate, Query effective Metoprolol XL '50mg'$  Appropriate, Query effective, ,  -Medications previously tried: lisinopril?  -Current home readings: no logs today, BP continues to fluctuate -Denies hypotensive/hypertensive symptoms -Educated on BP goals and benefits of medications for prevention of heart attack, stroke and kidney damage; Exercise goal of 150 minutes per week; Importance of home blood pressure monitoring; Symptoms of hypotension and importance of maintaining adequate hydration; -Counseled to monitor BP at home daily x 2 weeks, document, and provide log at future appointments -Recommended to continue current medication -Continue to monitor, she reports she takes extra dose of Metoprolol if her BP is high and this works to bring it down.  History of fluctuating BP.  Continue to monitor, she denies dizziness when she takes extra dose.  High fall risk so cautious to control BP too tightly.   Hyperlipidemia: (LDL goal < 70) -Uncontrolled -Current treatment: None -Medications previously tried: none noted  -Educated on Cholesterol goals;  Benefits of statin for ASCVD risk reduction; Importance of limiting foods high in cholesterol; -Recheck lipids at upcoming OV, Afib increased risk of stroke. LDL back in 2021 was elevated. Consider patient preference and upcoming labs to determine if statin is necessary.  Atrial Fibrillation (  Goal: prevent stroke and major bleeding) -Controlled, not assessed -Current treatment: Rate control: Metoprolol, XL '50mg'$  daily Appropriate, Effective, Safe, Accessible Digoxin 0.'125mg'$  daily Appropriate, Effective, Safe, Accessible Anticoagulation: Eliquis 2.5 mg BID Appropriate, Effective, Safe, Accessible -Counseled on increased risk of stroke due to Afib and benefits of anticoagulation for stroke prevention; importance of adherence to anticoagulant exactly as prescribed; -Recommended to continue  current medication She mentions sometimes she takes extra Toprolol when BP is high. Counseled on effect on HR and increasing fall risk. Check BP at upcoming OV - may need additional BP control to avoid extra doses of other meds.  Heart Failure (Goal: manage symptoms and prevent exacerbations) -Controlled, not assessed -Last ejection fraction: 55% -Current treatment: Metoprolol XL '50mg'$  daily Appropriate, Effective, Safe, Accessible -Medications previously tried: none noted  -Current home BP/HR readings: no logs today -Educated on Benefits of medications for managing symptoms and prolonging life Importance of weighing daily; if you gain more than 3 pounds in one day or 5 pounds in one week, contact providers Importance of blood pressure control -Recommended to continue current medication She does mention some slight swelling in feet and ankles, knows to contact us if it gets worse.  Osteoporosis (Goal Prevent falls/fractures) 07/24/22 -Not ideally controlled, declines DEXA -Last DEXA Scan: unknown - patient refused previously  -Patient is a candidate for pharmacologic treatment due to history of vertebral fracture -Current treatment  None -Medications previously tried: none  -Recommend [phone] units of vitamin D daily. Recommend 1200 mg of calcium daily from dietary and supplemental sources. Recommend weight-bearing and muscle strengthening exercises for building and maintaining bone density.  -Patient reports bones are strong. -She has not gotten DEXA, no changes at this time. -Please be careful and address fall risk or anything at the house.  Hypothyroidism (Goal: Maintain TSH) -Controlled -Current treatment  Levothyroxine 125mg daily Appropriate, Effective, Safe, Accessible -Medications previously tried: none noted -Takes appropriately  -Recommended to continue current medication  Patient Goals/Self-Care Activities Patient will:  - take medications as prescribed as  evidenced by patient report and record review check blood pressure daily, document, and provide at future appointments  Follow Up Plan: The care management team will reach out to the patient again over the next 120 days.          The patient verbalized understanding of instructions, educational materials, and care plan provided today and DECLINED offer to receive copy of patient instructions, educational materials, and care plan.  Telephone follow up appointment with pharmacy team member scheduled for: 4 months  CEdythe Rivera REast Gaffney PharmD Clinical Pharmacist  [facility]([phone]

## 2022-07-24 NOTE — Chronic Care Management (AMB) (Signed)
Encounter details:  CCM Services: This encounter meets complex CCM services and moderate to high decision making.  I have personally reviewed this encounter including the documentation in this note and have collaborated with the care management provider regarding care management and care coordination activities to include development and update of the comprehensive care plan. I am certifying that I agree with the content of this note and encounter as supervising physician.

## 2022-07-30 ENCOUNTER — Telehealth: Payer: Self-pay | Admitting: Family Medicine

## 2022-07-30 NOTE — Telephone Encounter (Signed)
Patient states: -She has been experiencing frequent urination, about 4-5 times per night  - She has noticed she has been wetting her diaper prior to making it to the restroom  - She is having no other symptoms   I offered pt an OV but she declined. Stated she can't make it in office due to living in a living facility and them having a certain process.   Patient requests: -Any other recommendations from PCP  Please advise.

## 2022-07-31 NOTE — Telephone Encounter (Signed)
I called pt and she stated that family is coming in from [place] tomorrow, and that they may take or bring her to doctor office. She said she will see what happens.

## 2022-08-07 NOTE — Progress Notes (Deleted)
NEUROLOGY CONSULTATION NOTE  Deanna Rivera MRN: [id number] DOB: 1932-11-07  Referring provider: Billey Chang, MD Primary care provider: Billey Chang, MD  Reason for consult:  frequent falls  Assessment/Plan:   ***   Subjective:  Deanna Rivera is an 88 year old female with HTN, atrial fibrillation, MVR, sick sinus syndrome, acquired hypothyroidism s/p treatment of Hashimoto's and arthritis who presents for falls.  History supplemented by prior neurologist's notes.  Patient has had progressive weakness since the mid-2000s.  She reports weakness in both upper and lower extremities, but particularly in her upper extremities, primarily in the hands.       PAST MEDICAL HISTORY: Past Medical History:  Diagnosis Date   Cataract    Hypertension    Moderate aortic stenosis    Moderate mitral regurgitation    Persistent atrial fibrillation (HCC)    PMR (polymyalgia rheumatica) (HCC)    Second degree AV block    Sick sinus syndrome ([place])    Thyroid disease     PAST SURGICAL HISTORY: Past Surgical History:  Procedure Laterality Date   ABDOMINAL HYSTERECTOMY     APPENDECTOMY     CESAREAN SECTION     x 3   CHOLECYSTECTOMY     PACEMAKER GENERATOR CHANGE  11/20/2018   [employer] Accolade MRI PPM implanted in [place] CYST EXCISION      MEDICATIONS: Current Outpatient Medications on File Prior to Visit  Medication Sig Dispense Refill   acetaminophen (TYLENOL) 500 MG tablet Take 500 mg by mouth every 6 (six) hours as needed for mild pain or headache.     apixaban (ELIQUIS) 2.5 MG TABS tablet Take 1 tablet (2.5 mg total) by mouth 2 (two) times daily. 180 tablet 2   cephALEXin (KEFLEX) 500 MG capsule Take 1 capsule (500 mg total) by mouth 2 (two) times daily. 14 capsule 0   Cholecalciferol (VITAMIN D3) 25 MCG (1000 UT) CAPS Take 1 capsule by mouth daily.     digoxin (LANOXIN) 0.125 MG tablet TAKE 1 TABLET BY MOUTH DAILY 90 tablet 3    dorzolamide-timolol (COSOPT) 22.3-6.8 MG/ML ophthalmic solution Place 1 drop into both eyes 2 (two) times daily.     hydrochlorothiazide (HYDRODIURIL) 25 MG tablet TAKE 1 TABLET BY MOUTH  DAILY 90 tablet 3   latanoprost (XALATAN) 0.005 % ophthalmic solution Place 1 drop into both eyes at bedtime.     levothyroxine (SYNTHROID) 112 MCG tablet Take 1 tablet (112 mcg total) by mouth daily before breakfast. 90 tablet 2   methocarbamol (ROBAXIN) 500 MG tablet Take 1 tablet (500 mg total) by mouth every 8 (eight) hours as needed for muscle spasms. 20 tablet 0   metoprolol succinate (TOPROL-XL) 50 MG 24 hr tablet TAKE 1 TABLET BY MOUTH DAILY 90 tablet 3   Nitroglycerin 0.4 % OINT Apply 1 inch intra-anally twice a day for 2 weeks 30 g 0   oxyCODONE (ROXICODONE) 5 MG immediate release tablet Take 0.5 tablets (2.5 mg total) by mouth 3 (three) times daily as needed for severe pain. AFTER EATING. 10 tablet 0   predniSONE (DELTASONE) 1 MG tablet TAKE 1 TABLET BY MOUTH  DAILY 90 tablet 3   No current facility-administered medications on file prior to visit.    ALLERGIES: Allergies  Allergen Reactions   Shellfish-Derived Products Shortness Of Breath and Other (See Comments)    Lips go numb   Epinephrine Other (See Comments) and Hypertension    "Makes me  feel wired"   Monosodium Glutamate Other (See Comments)    other   Ciprofloxacin Other (See Comments)    Allergic reaction not recalled   Erythromycin Other (See Comments)    Allergic reaction not recalled   Monosodium Glutamate Other (See Comments) and Hypertension    "Makes me feel badly"   Sulfa Antibiotics Other (See Comments)    Allergic reaction not recalled   Sulfamethoxazole-Trimethoprim    Tetracyclines & Related Other (See Comments)    Allergic reaction not recalled    FAMILY HISTORY: Family History  Problem Relation Age of Onset   Early death Mother 67   Hypertension Brother    Cirrhosis Father 32   Lung cancer Brother 65    Scleroderma Daughter     Objective:  *** General: No acute distress.  Patient appears well-groomed.   Head:  Normocephalic/atraumatic Eyes:  fundi examined but not visualized Neck: supple, no paraspinal tenderness, full range of motion Back: No paraspinal tenderness Heart: regular rate and rhythm Lungs: Clear to auscultation bilaterally. Vascular: No carotid bruits. Neurological Exam: Mental status: alert and oriented to person, place, and time, speech fluent and not dysarthric, language intact. Cranial nerves: CN I: not tested CN II: pupils equal, round and reactive to light, visual fields intact CN III, IV, VI:  full range of motion, no nystagmus, no ptosis CN V: facial sensation intact. CN VII: upper and lower face symmetric CN VIII: hearing intact CN IX, X: gag intact, uvula midline CN XI: sternocleidomastoid and trapezius muscles intact CN XII: tongue midline Bulk & Tone: normal, no fasciculations. Motor:  muscle strength 5/5 throughout Sensation:  Pinprick, temperature and vibratory sensation intact. Deep Tendon Reflexes:  2+ throughout,  toes downgoing.   Finger to nose testing:  Without dysmetria.   Heel to shin:  Without dysmetria.   Gait:  Normal station and stride.  Romberg negative.    Thank you for allowing me to take part in the care of this patient.  Metta Clines, DO  CC: ***

## 2022-08-08 ENCOUNTER — Ambulatory Visit: Payer: Medicare Other | Admitting: Neurology

## 2022-08-08 ENCOUNTER — Other Ambulatory Visit: Payer: Self-pay | Admitting: Family Medicine

## 2022-08-08 ENCOUNTER — Ambulatory Visit (INDEPENDENT_AMBULATORY_CARE_PROVIDER_SITE_OTHER): Payer: Medicare Other

## 2022-08-08 DIAGNOSIS — I495 Sick sinus syndrome: Secondary | ICD-10-CM

## 2022-08-08 NOTE — Progress Notes (Signed)
Initial neurology clinic note  SERVICE DATE: 08/14/21  Reason for Evaluation: Consultation requested by Deanna Arnt, MD for an opinion regarding progressive weakness. My final recommendations will be communicated back to the requesting physician by way of shared medical record or letter to requesting physician via [place] mail.  HPI: This is Ms. Deanna Rivera, a 88 y.o. right-handed female with a medical history of HTN, CHF, afib s/p PM, hypothyroidism (Hashimoto's), polymyalgia rheumatica, and prior presumptive diagnosis of inclusion body myositis who presents to neurology clinic with the chief complaint of imbalance and not getting stronger with therapy (leg weakness). The patient is accompanied by husband.  Patient had symptoms since at least 1970s when she was having weakness. She was told she had lupus. She had improvement of symptoms. She had weakness for many years at least prior to 2009. Per records: Patient has had symptoms for many years. She has previously been seen by neurology at [facility] (Dr. Lennie Odor 02/05/2008), and [facility]. She had an EMG in 2009 that showed myopathic changes in multiple muscles and a muscle biopsy that showed inflammatory infiltrates per documentation from [facility] in 2019. 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 She may have also been diagnosed with polymyalgia rheumatica or polymyositis in the past as well, per patient. Of note, CK in 2019 was 404.  Patient is currently in wheelchair most of the time for about 3 months. Prior to this, patient was using a walker. When she using her walker, she would fall very often (more than 60 times). She was progressively getting weaker over the years. About 1 year ago she fell and broke a T9 vertebrae. Since this, she has particularly gotten worse. She was told to use a wheelchair to prevent falls. Patient continues to work with PT but is not improving. Patient was referred to [place] by PCP ([facility]) who said the fracture had healed but that there was degenerative changes and "things did not heal well." She is not a surgical candidate per patient.   Patient has had difficulty swallowing for years (liquids and solids). Of late, it has been worse. Patient was found to have a left neck mass. Patient was to have a swallow test and CT of neck, but has not done either.  Patient denies significant shortness of breath, including when laying flat.  Per patient, she has had longstanding numbness in feet (called it neuropathy).  She can have some pins and needles at night (mild).  She also endorses poor sleep. She is not sure why she does not sleep well, but says her husband has to get up multiple times per night that wakes patient and she cannot fall back asleep.  Patient currently lives in independent living. She does not like the food and sometimes misses meals. She denies significant weight loss. She gets PT at her facility.  Family history of neuropathy/myopathy/NM disease? Daughter had scleroderma and died at age 6.  Patient has been on long term steroids for decades. She is currently on 1 mg of prednisone daily.   MEDICATIONS:  Outpatient Encounter Medications as of 08/14/2022  Medication Sig   acetaminophen (TYLENOL) 500 MG tablet Take 500 mg by mouth every 6 (six) hours as needed for mild pain or headache.   apixaban (ELIQUIS) 2.5 MG TABS tablet Take 1 tablet (2.5 mg total) by mouth 2 (two) times daily.   Cholecalciferol (VITAMIN D3) 25 MCG (1000 UT) CAPS Take 1 capsule by mouth daily.  digoxin (LANOXIN) 0.125 MG tablet TAKE 1 TABLET BY MOUTH DAILY   dorzolamide-timolol (COSOPT) 22.3-6.8 MG/ML ophthalmic solution Place 1 drop into both eyes 2 (two) times daily.   hydrochlorothiazide (HYDRODIURIL) 25 MG tablet TAKE 1 TABLET BY MOUTH  DAILY   latanoprost (XALATAN) 0.005 % ophthalmic solution Place 1 drop into both eyes at bedtime.   levothyroxine (SYNTHROID) 112 MCG tablet TAKE 1  TABLET BY MOUTH DAILY  BEFORE BREAKFAST   methocarbamol (ROBAXIN) 500 MG tablet Take 1 tablet (500 mg total) by mouth every 8 (eight) hours as needed for muscle spasms.   metoprolol succinate (TOPROL-XL) 50 MG 24 hr tablet TAKE 1 TABLET BY MOUTH DAILY   Nitroglycerin 0.4 % OINT Apply 1 inch intra-anally twice a day for 2 weeks   oxyCODONE (ROXICODONE) 5 MG immediate release tablet Take 0.5 tablets (2.5 mg total) by mouth 3 (three) times daily as needed for severe pain. AFTER EATING.   predniSONE (DELTASONE) 1 MG tablet TAKE 1 TABLET BY MOUTH DAILY   [DISCONTINUED] cephALEXin (KEFLEX) 500 MG capsule Take 1 capsule (500 mg total) by mouth 2 (two) times daily.   [DISCONTINUED] levothyroxine (SYNTHROID) 112 MCG tablet Take 1 tablet (112 mcg total) by mouth daily before breakfast.   [DISCONTINUED] predniSONE (DELTASONE) 1 MG tablet TAKE 1 TABLET BY MOUTH  DAILY   No facility-administered encounter medications on file as of 08/14/2022.    PAST MEDICAL HISTORY: Past Medical History:  Diagnosis Date   Cataract    Hypertension    Moderate aortic stenosis    Moderate mitral regurgitation    Persistent atrial fibrillation (HCC)    PMR (polymyalgia rheumatica) (HCC)    Second degree AV block    Sick sinus syndrome ([place])    Thyroid disease     PAST SURGICAL HISTORY: Past Surgical History:  Procedure Laterality Date   ABDOMINAL HYSTERECTOMY     APPENDECTOMY     CESAREAN SECTION     x 3   CHOLECYSTECTOMY     PACEMAKER GENERATOR CHANGE  11/20/2018   [employer] Accolade MRI PPM implanted in [place] CYST EXCISION      ALLERGIES: Allergies  Allergen Reactions   Shellfish-Derived Products Shortness Of Breath and Other (See Comments)    Lips go numb   Epinephrine Other (See Comments) and Hypertension    "Makes me feel wired"   Monosodium Glutamate Other (See Comments)    other   Ciprofloxacin Other (See Comments)    Allergic reaction not  recalled   Erythromycin Other (See Comments)    Allergic reaction not recalled   Monosodium Glutamate Other (See Comments) and Hypertension    "Makes me feel badly"   Sulfa Antibiotics Other (See Comments)    Allergic reaction not recalled   Sulfamethoxazole-Trimethoprim    Tetracyclines & Related Other (See Comments)    Allergic reaction not recalled    FAMILY HISTORY: Family History  Problem Relation Age of Onset   Early death Mother 95   Hypertension Brother    Cirrhosis Father 77   Lung cancer Brother 48   Scleroderma Daughter     SOCIAL HISTORY: Social History   Tobacco Use   Smoking status: Former    Types: Cigarettes   Smokeless tobacco: Never  Substance Use Topics   Alcohol use: Not Currently   Drug use: Not Currently   Social History   Social History Narrative   ** Merged History Encounter **  Recently moved from [place]. In independent living with her husband. Daughter died from scleroderma complications. Hx of falls, but last was > 1.5 years ago. 01/18/2019      OBJECTIVE: PHYSICAL EXAM: BP (!) 161/82   Pulse 62   Ht _0  (1.626 m)   Wt 127 lb (57.6 kg)   SpO2 97%   BMI 21.80 kg/m   General: General appearance: Awake and alert. No distress. Patient in wheelchair. Cooperative with exam.  Skin: No obvious rash or jaundice. HEENT: Atraumatic. Anicteric. Lungs: Non-labored breathing on room air  Extremities: No edema. Very sore to palpation in right shoulder and right biceps Musculoskeletal: No obvious joint swelling. Psych: Affect appropriate.  Neurological: Mental Status: Alert. Speech fluent. No pseudobulbar affect Cranial Nerves: CNII: No RAPD. Visual fields grossly intact. CNIII, IV, VI: PERRL. No nystagmus. EOMI. CN V: Facial sensation intact bilaterally to fine touch. [place] clench strong. Jaw jerk negative. CN VII: Facial muscles symmetric and strong. No ptosis at rest. CN VIII: Hearing grossly intact bilaterally. CN IX:  No hypophonia. CN X: Palate elevates symmetrically. CN XI: Full strength shoulder shrug bilaterally. CN XII: Tongue protrusion midline. No atrophy or fasciculations. No significant dysarthria Motor: Tone is normal. No fasciculations in extremities. Atrophy of bilateral forearms and thighs  Individual muscle group testing (MRC grade out of 5):  Movement     Neck flexion 4    Neck extension 5     Right Left   Shoulder abduction 4 4   Elbow flexion 4 4+   Elbow extension 4 4   Finger abduction - FDI 3 3   Finger abduction - ADM 3 3   Finger extension 4- 4-   Finger distal flexion - 2/3 4- 4-   Finger distal flexion - 4/5 4- 4-   Thumb flexion - FPL 3 2    Hip flexion 4 4   Hip adduction 5 5   Hip abduction 5 5   Knee extension 5- 5-   Knee flexion 4 4   Dorsiflexion 4 4   Plantarflexion 5- 5-   Inversion 5 5   Eversion 5- 5-     Reflexes:  Right Left   Bicep Trace Trace   Tricep Trace Trace   BrRad Trace Trace   Knee 0 0   Ankle 0 0    Pathological Reflexes: Babinski: flexor response bilaterally Hoffman: absent bilaterally Troemner: absent bilaterally Sensation: Pinprick: Diminished sensation to pinprick throughout bilateral legs and in hands. Gait: Unable to safely ambulate.  Lab and Test Review: External labs: Per [facility] clinic note from 02/05/2008: CK: since 2002, levels in the 192 to 440 (440 in January 2009. December 03 2007 (5 days post emg) 268 ESR remains high since 2002 (44-82)  EMG elsewhere (april 17th 2009) Normal upper extremities sensory nerve responses, but for prolongation of median latency, low motor amplitudes ulnar and tibial, needle electrode examination shows fibrillations and positive sharp waves and small motor unit potentials in all muscles tested (proximal and paraspinals- no hand-forearm muscles).  US neck (may 2009): multinodular goiter  MRI left leg (february 2009) Signal abn in vastus lateralis, fatty infiltration in vastus lat and  semimembranosus Right leg: vastus changes  Muscle biopsy (april 1st 2009) Active inflammation with muscle fiber destruction and invasion by lymphocytes, mild type 2 atrophy, stains normal, no inclusion (no EM done)  Blood tests June 15th Cmp normal Ck 124 Crp N ESR 44 No monoclonal protein in blood and urine Hepatitis B serology-  no exposure ANA, ENA, RF negative TSH 0.27 PTH 72 (could indicate low vitamin D) CBC N Urinalysis normal   Imaging: CT thoracic spine wo contrast (07/11/22): IMPRESSION: Chronic T8 compression fracture with progressive anterior height loss, measuring up to 60%, previously 20% on CT June 2022, with exaggerated thoracic kyphosis centered at T8. Unchanged 3 mm inferior endplate retropulsion with resultant mild spinal canal stenosis at this level.   No new compression fracture.   Mild multilevel degenerative changes of the thoracic spine.   3 mm pulmonary nodule in the left upper lobe. No follow-up needed if patient is low-risk. A 12 month follow-up chest CT can be considered if the patient is high-risk.This recommendation follows the consensus statement: Guidelines for Management of Incidental Pulmonary Nodules Detected on CT Images: From the Fleischner Society 2017; Radiology 2017; 284:228-243.  CT cervical spine wo contrast (08/09/21): IMPRESSION: 1. Slight anterior subluxations at C2-3 and C3-4, similar to prior study, likely degenerative. 2. No acute displaced fractures identified. 3. Diffuse degenerative changes.  CT head wo contrast (09/04/21): IMPRESSION: Atrophy, chronic microvascular disease.   No acute intracranial abnormality.  ASSESSMENT: Deanna Rivera is a 88 y.o. female who presents for evaluation of progressive weakness. She has a relevant medical history of HTN, CHF, afib s/p PM, hypothyroidism (Hashimoto's), polymyalgia rheumatica, and prior presumptive diagnosis of inclusion body myositis. Her neurological examination is  pertinent for generalized weakness both distally and proximally, diminished sensation in bilateral lower extremities, hyporeflexia, and inability to ambulate. Previous extensive work up is summarized above but includes MRI showing fatty infiltration of vastus lateralis and semimembranosus muscles. EMG in 2009 showed normal sensory responses, reduced motor responses, and fibrillation potentials on needle examination. Muscle biopsy in 2009 showed active inflammation and muscle deinnervation.   This is a complex case with multiple possible overlapping etiologies. Patient's symptoms have been present for decades and slowly progressive despite therapy. This is likely in keeping with previous diagnosis of inclusion body myositis (IBM). This may also be responsible for dysphagia, though there may be a neck mass as well that could explain this. Patient also has a rheumatologic history (?SLE) and long term treatment with steroids, either of which could contribute. The lack of sensation in lower extremities and "neuropathy" patient describes would be be expected in [place] though. There may be an overlapping polyneuropathy. I discussed repeating an EMG to clarify, but patient was not interested in pursuing this at this time. I will look for treatable causes and manage conservatively given the slow progressive nature and likely diagnosis of IBM.  PLAN: -Blood work: CK, B1, B12, IFE, ESR, ANA, ENA -Patient was to have a swallow test and CT of neck - ordered by PCP but not completed. I recommended she complete these. -Continue PT -Discussed EMG. Patient not interested in repeating EMG at this time.  -Return to clinic in 6 months  The impression above as well as the plan as outlined below were extensively discussed with the patient (in the company of husband) who voiced understanding. All questions were answered to their satisfaction.  The patient was counseled on pertinent fall precautions per the printed material  provided today, and as noted under the "Patient Instructions" section below.  When available, results of the above investigations and possible further recommendations will be communicated to the patient via telephone/MyChart. Patient to call office if not contacted after expected testing turnaround time.   Total time spent reviewing records, interview, history/exam, documentation, and coordination of care on day of encounter:  8  min   Thank you for allowing me to participate in patient's care.  If I can answer any additional questions, I would be pleased to do so.  Kai Levins, MD   CC: Deanna Rivera, [place] [id number]  CC: Referring provider: Leamon Rivera, [address] [place],  [place] [address]

## 2022-08-09 LAB — CUP PACEART REMOTE DEVICE CHECK
Battery Remaining Longevity: 150 mo
Battery Remaining Percentage: 100 %
Brady Statistic RA Percent Paced: 0 %
Brady Statistic RV Percent Paced: 77 %
Date Time Interrogation Session: 20231227064100
Implantable Lead Connection Status: 753985
Implantable Lead Connection Status: 753985
Implantable Lead Implant Date: 20130305
Implantable Lead Implant Date: 20130305
Implantable Lead Location: 753859
Implantable Lead Location: 753860
Implantable Lead Model: 4469
Implantable Lead Model: 4470
Implantable Lead Serial Number: 546229
Implantable Lead Serial Number: 712016
Implantable Pulse Generator Implant Date: 20200409
Lead Channel Impedance Value: 417 Ohm
Lead Channel Pacing Threshold Amplitude: 0.6 V
Lead Channel Pacing Threshold Pulse Width: 0.4 ms
Lead Channel Setting Pacing Amplitude: 1.1 V
Lead Channel Setting Pacing Pulse Width: 0.4 ms
Lead Channel Setting Sensing Sensitivity: 0.6 mV
Pulse Gen Serial Number: 859007
Zone Setting Status: 755011

## 2022-08-14 ENCOUNTER — Encounter: Payer: Self-pay | Admitting: Neurology

## 2022-08-14 ENCOUNTER — Ambulatory Visit (INDEPENDENT_AMBULATORY_CARE_PROVIDER_SITE_OTHER): Payer: Medicare Other | Admitting: Neurology

## 2022-08-14 ENCOUNTER — Other Ambulatory Visit (INDEPENDENT_AMBULATORY_CARE_PROVIDER_SITE_OTHER): Payer: Medicare Other

## 2022-08-14 VITALS — BP 161/82 | HR 62 | Ht 64.0 in | Wt 127.0 lb

## 2022-08-14 DIAGNOSIS — R262 Difficulty in walking, not elsewhere classified: Secondary | ICD-10-CM

## 2022-08-14 DIAGNOSIS — R209 Unspecified disturbances of skin sensation: Secondary | ICD-10-CM | POA: Diagnosis not present

## 2022-08-14 DIAGNOSIS — G7241 Inclusion body myositis [IBM]: Secondary | ICD-10-CM | POA: Diagnosis not present

## 2022-08-14 DIAGNOSIS — R531 Weakness: Secondary | ICD-10-CM

## 2022-08-14 DIAGNOSIS — G629 Polyneuropathy, unspecified: Secondary | ICD-10-CM

## 2022-08-14 LAB — CK: Total CK: 251 U/L — ABNORMAL HIGH (ref 7–177)

## 2022-08-14 LAB — VITAMIN B12: Vitamin B-12: 493 pg/mL (ref 211–911)

## 2022-08-14 LAB — SEDIMENTATION RATE: Sed Rate: 73 mm/hr — ABNORMAL HIGH (ref 0–30)

## 2022-08-14 NOTE — Patient Instructions (Addendum)
I saw you today for weakness. 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 IBM causes very slow progression over many decades, as you have experienced. There is no treatment, and while exercise can slow progression, physical therapy would not be expected to make you stronger. You should continue physical therapy though to stay active and prevent muscles from getting weaker faster.  The swallowing problems are a major concern as this could get you in the most trouble. I recommend you do the swallowing test and CT of your neck to further investigate as planned by your primary doctor.  I want to get blood work today to look for any other treatable cause of your symptoms. I will be in touch when I have your results.  I would like to see you back in clinic in 6 months, or sooner if needed. Please let me know if you have any questions or concerns in the meantime.   The physicians and staff at [facility] are committed to providing excellent care. You may receive a survey requesting feedback about your experience at our office. We strive to receive "very good" responses to the survey questions. If you feel that your experience would prevent you from giving the office a "very good " response, please contact our office to try to remedy the situation. We may be reached at [phone]. Thank you for taking the time out of your busy day to complete the survey.  Kai Levins, MD [facility]  Preventing Falls at [facility] are common, often dreaded events in the lives of older people. Aside from the obvious injuries and even death that may result, fall can cause wide-ranging consequences including loss of independence, mental decline, decreased activity and mobility. Younger people are also at risk of falling, especially those with chronic illnesses and fatigue.  Ways to reduce risk for falling Examine diet and medications. Warm foods and alcohol dilate  blood vessels, which can lead to dizziness when standing. Sleep aids, antidepressants and pain medications can also increase the likelihood of a fall.  Get a vision exam. Poor vision, cataracts and glaucoma increase the chances of falling.  Check foot gear. Shoes should fit snugly and have a sturdy, nonskid sole and a broad, low heel  Participate in a physician-approved exercise program to build and maintain muscle strength and improve balance and coordination. Programs that use ankle weights or stretch bands are excellent for muscle-strengthening. Water aerobics programs and low-impact Tai Chi programs have also been shown to improve balance and coordination.  Increase vitamin D intake. Vitamin D improves muscle strength and increases the amount of calcium the body is able to absorb and deposit in bones.  How to prevent falls from common hazards Floors - Remove all loose wires, cords, and throw rugs. Minimize clutter. Make sure rugs are anchored and smooth. Keep furniture in its usual place.  Chairs -- Use chairs with straight backs, armrests and firm seats. Add firm cushions to existing pieces to add height.  Bathroom - Install grab bars and non-skid tape in the tub or shower. Use a bathtub transfer bench or a shower chair with a back support Use an elevated toilet seat and/or safety rails to assist standing from a low surface. Do not use towel racks or bathroom tissue holders to help you stand.  Lighting - Make sure halls, stairways, and entrances are well-lit. Install a night light in your bathroom or hallway. Make sure there is a  light switch at the top and bottom of the staircase. Turn lights on if you get up in the middle of the night. Make sure lamps or light switches are within reach of the bed if you have to get up during the night.  Kitchen - Install non-skid rubber mats near the sink and stove. Clean spills immediately. Store frequently used utensils, pots, pans between waist and eye  level. This helps prevent reaching and bending. Sit when getting things out of lower cupboards.  Living room/ Bedrooms - Place furniture with wide spaces in between, giving enough room to move around. Establish a route through the living room that gives you something to hold onto as you walk.  Stairs - Make sure treads, rails, and rugs are secure. Install a rail on both sides of the stairs. If stairs are a threat, it might be helpful to arrange most of your activities on the lower level to reduce the number of times you must climb the stairs.  Entrances and doorways - Install metal handles on the walls adjacent to the doorknobs of all doors to make it more secure as you travel through the doorway.  Tips for maintaining balance Keep at least one hand free at all times. Try using a backpack or fanny pack to hold things rather than carrying them in your hands. Never carry objects in both hands when walking as this interferes with keeping your balance.  Attempt to swing both arms from front to back while walking. This might require a conscious effort if Parkinson's disease has diminished your movement. It will, however, help you to maintain balance and posture, and reduce fatigue.  Consciously lift your feet off of the ground when walking. Shuffling and dragging of the feet is a common culprit in losing your balance.  When trying to navigate turns, use a "U" technique of facing forward and making a wide turn, rather than pivoting sharply.  Try to stand with your feet shoulder-length apart. When your feet are close together for any length of time, you increase your risk of losing your balance and falling.  Do one thing at a time. Don't try to walk and accomplish another task, such as reading or looking around. The decrease in your automatic reflexes complicates motor function, so the less distraction, the better.  Do not wear rubber or gripping soled shoes, they might "catch" on the floor and cause  tripping.  Move slowly when changing positions. Use deliberate, concentrated movements and, if needed, use a grab bar or walking aid. Count 15 seconds between each movement. For example, when rising from a seated position, wait 15 seconds after standing to begin walking.  If balance is a continuous problem, you might want to consider a walking aid such as a cane, walking stick, or walker. Once you've mastered walking with help, you might be ready to try it on your own again.

## 2022-08-17 ENCOUNTER — Telehealth: Payer: Self-pay | Admitting: Family Medicine

## 2022-08-17 LAB — FANA STAINING PATTERNS: Homogeneous Pattern: 1:1280 {titer} — ABNORMAL HIGH

## 2022-08-17 LAB — IMMUNOFIXATION, SERUM
IgA/Immunoglobulin A, Serum: 1005 mg/dL — ABNORMAL HIGH (ref 64–422)
IgG (Immunoglobin G), Serum: 1513 mg/dL (ref 586–1602)
IgM (Immunoglobulin M), Srm: 134 mg/dL (ref 26–217)

## 2022-08-17 LAB — ANA+ENA+DNA/DS+SCL 70+SJOSSA/B
ANA Titer 1: POSITIVE — AB
ENA RNP Ab: <0.2 AI (ref 0.0–0.9)
ENA SM Ab Ser-aCnc: <0.2 AI (ref 0.0–0.9)
ENA SSA (RO) Ab: 0.2 AI (ref 0.0–0.9)
ENA SSB (LA) Ab: <0.2 AI (ref 0.0–0.9)
Scleroderma (Scl-70) (ENA) Antibody, IgG: <0.2 AI (ref 0.0–0.9)
dsDNA Ab: 3 IU/mL (ref 0–9)

## 2022-08-17 LAB — VITAMIN B1: Vitamin B1 (Thiamine): 11 nmol/L (ref 8–30)

## 2022-08-17 NOTE — Telephone Encounter (Signed)
Patient states: -PCP ordered a CT for her to have completed. She has OV with PCP on 08/29/22 and would like it completed by then  - Original place CT was ordered to be done at , [facility] imaging, told her they can't do it   Patient requests: - Ordered be made to have completed at [place] [facility] [place]

## 2022-08-21 ENCOUNTER — Telehealth: Payer: Self-pay | Admitting: Neurology

## 2022-08-21 NOTE — Telephone Encounter (Signed)
Called patient to discuss the results of her testing.  Overall, her CK and ESR are similar to prior. Her CK has been higher in the past. I think her muscle disease is slowly progressive, as would be expected in IBM (what has been her presumed diagnosis in the past).  Her ANA was positive (1:1280) with a negative ENA. Patient does not currently see rheumatology. I discussed that they would be helpful to make sure there was not something we were missing. Patient was in agreement with this.  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 She is agreeable to hematology referral for this.  All questions were answered.  Kai Levins, MD [facility]

## 2022-08-22 ENCOUNTER — Other Ambulatory Visit: Payer: Self-pay

## 2022-08-22 DIAGNOSIS — R768 Other specified abnormal immunological findings in serum: Secondary | ICD-10-CM

## 2022-08-22 DIAGNOSIS — D472 Monoclonal gammopathy: Secondary | ICD-10-CM

## 2022-08-23 ENCOUNTER — Telehealth: Payer: Self-pay | Admitting: Pharmacist

## 2022-08-23 NOTE — Progress Notes (Signed)
Care Management & Coordination Services Pharmacy Team  Reason for Encounter: Hypertension  Contacted patient on 08/24/2022 to discuss hypertension disease state.   Recent office visits:  None  Recent consult visits:  08/14/2022 OV (Neurology) Shellia Carwin, MD; no medication changes noted.  Hospital visits:  None in previous 6 months  Medications: Outpatient Encounter Medications as of 08/23/2022  Medication Sig   acetaminophen (TYLENOL) 500 MG tablet Take 500 mg by mouth every 6 (six) hours as needed for mild pain or headache.   apixaban (ELIQUIS) 2.5 MG TABS tablet Take 1 tablet (2.5 mg total) by mouth 2 (two) times daily.   Cholecalciferol (VITAMIN D3) 25 MCG (1000 UT) CAPS Take 1 capsule by mouth daily.   digoxin (LANOXIN) 0.125 MG tablet TAKE 1 TABLET BY MOUTH DAILY   dorzolamide-timolol (COSOPT) 22.3-6.8 MG/ML ophthalmic solution Place 1 drop into both eyes 2 (two) times daily.   hydrochlorothiazide (HYDRODIURIL) 25 MG tablet TAKE 1 TABLET BY MOUTH  DAILY   latanoprost (XALATAN) 0.005 % ophthalmic solution Place 1 drop into both eyes at bedtime.   levothyroxine (SYNTHROID) 112 MCG tablet TAKE 1 TABLET BY MOUTH DAILY  BEFORE BREAKFAST   methocarbamol (ROBAXIN) 500 MG tablet Take 1 tablet (500 mg total) by mouth every 8 (eight) hours as needed for muscle spasms.   metoprolol succinate (TOPROL-XL) 50 MG 24 hr tablet TAKE 1 TABLET BY MOUTH DAILY   Nitroglycerin 0.4 % OINT Apply 1 inch intra-anally twice a day for 2 weeks   oxyCODONE (ROXICODONE) 5 MG immediate release tablet Take 0.5 tablets (2.5 mg total) by mouth 3 (three) times daily as needed for severe pain. AFTER EATING.   predniSONE (DELTASONE) 1 MG tablet TAKE 1 TABLET BY MOUTH DAILY   No facility-administered encounter medications on file as of 08/23/2022.    Recent Office Vitals: BP Readings from Last 3 Encounters:  08/14/22 (!) 161/82  05/30/22 134/70  05/15/22 138/78   Pulse Readings from Last 3 Encounters:   08/14/22 62  05/30/22 (!) 59  05/15/22 60    Wt Readings from Last 3 Encounters:  08/14/22 127 lb (57.6 kg)  05/30/22 128 lb (58.1 kg)  05/15/22 129 lb (58.5 kg)     Kidney Function Lab Results  Component Value Date/Time   CREATININE 0.30 (L) 05/03/2022 10:34 AM   CREATININE 0.31 (L) 12/27/2021 03:11 PM   CREATININE 0.34 (L) 07/28/2020 11:31 AM   CREATININE 0.35 (L) 03/08/2020 11:55 AM   GFR 95.50 05/03/2022 10:34 AM   GFRNONAA >60 09/04/2021 07:40 PM   GFRNONAA 100 07/28/2020 11:31 AM   GFRAA 116 07/28/2020 11:31 AM       Latest Ref Rng & Units 05/03/2022   10:34 AM 12/27/2021    3:11 PM 09/04/2021    7:53 PM  BMP  Glucose 70 - 99 mg/dL 87  84  109   BUN 6 - 23 mg/dL '22  20  24   '$ Creatinine 0.40 - 1.20 mg/dL 0.30  0.31  0.30   Sodium 135 - 145 mEq/L 137  137  134   Potassium 3.5 - 5.1 mEq/L 3.8  4.6  3.8   Chloride 96 - 112 mEq/L 100  100  96   CO2 19 - 32 mEq/L 31  30    Calcium 8.4 - 10.5 mg/dL 10.5  10.8       Current antihypertensive regimen:  Hydrochlorothiazide 25 mg daily Metoprolol Succinate 50 mg daily  Patient states her husband has not been feeling well  so she hasn't been checking her blood pressure at home. Patient states she does not want to complete this call at this time.  Adherence Review: Is the patient currently on ACE/ARB medication? No Does the patient have >5 day gap between last estimated fill dates? No  Star Rating Drugs:  None    Future Appointments  Date Time Provider Callender  08/29/2022  2:30 PM [name], MD [facility]  09/11/2022  2:00 PM Evans Lance, MD [facility]  09/27/2022  1:40 PM Ladell Pier, MD [facility] None  11/07/2022  8:15 AM [facility] DEVICE REMOTES [facility]  11/26/2022  2:45 PM [facility] CCM PHARMACIST [facility]  02/06/2023  8:15 AM [facility] DEVICE REMOTES [facility]  02/13/2023  2:00 PM Shellia Carwin, MD [facility] None  05/08/2023  8:15 AM  [facility] DEVICE REMOTES [facility]  11/06/2023  8:15 AM [facility] DEVICE REMOTES [facility]   April D Calhoun, [place] Pharmacist Assistant [phone]

## 2022-08-28 NOTE — Progress Notes (Signed)
Remote pacemaker transmission.   

## 2022-08-29 ENCOUNTER — Other Ambulatory Visit (HOSPITAL_COMMUNITY): Payer: Self-pay

## 2022-08-29 ENCOUNTER — Ambulatory Visit (INDEPENDENT_AMBULATORY_CARE_PROVIDER_SITE_OTHER): Payer: Medicare Other | Admitting: Family Medicine

## 2022-08-29 VITALS — BP 174/74 | HR 72 | Temp 98.0°F | Ht 64.0 in | Wt 127.0 lb

## 2022-08-29 DIAGNOSIS — R1310 Dysphagia, unspecified: Secondary | ICD-10-CM | POA: Diagnosis not present

## 2022-08-29 DIAGNOSIS — R221 Localized swelling, mass and lump, neck: Secondary | ICD-10-CM | POA: Diagnosis not present

## 2022-08-29 DIAGNOSIS — G7241 Inclusion body myositis [IBM]: Secondary | ICD-10-CM

## 2022-08-29 DIAGNOSIS — M353 Polymyalgia rheumatica: Secondary | ICD-10-CM

## 2022-08-29 DIAGNOSIS — R300 Dysuria: Secondary | ICD-10-CM

## 2022-08-29 DIAGNOSIS — D472 Monoclonal gammopathy: Secondary | ICD-10-CM

## 2022-08-29 DIAGNOSIS — R059 Cough, unspecified: Secondary | ICD-10-CM

## 2022-08-29 LAB — POCT URINALYSIS DIPSTICK
Bilirubin, UA: NEGATIVE
Glucose, UA: NEGATIVE
Ketones, UA: NEGATIVE
Nitrite, UA: POSITIVE
Protein, UA: NEGATIVE
Spec Grav, UA: 1.01 (ref 1.010–1.025)
Urobilinogen, UA: 0.2 E.U./dL
pH, UA: 7 (ref 5.0–8.0)

## 2022-08-29 NOTE — Progress Notes (Signed)
Subjective  CC:  Chief Complaint  Patient presents with   Swallowing dysfunction:    neck mass    HPI: Deanna Rivera is a 89 y.o. female who presents to the office today to address the problems listed above in the chief complaint. Weakness: reviewed consult note from neurology: very thorough evaluation with problems pointing to progressive polymyositis/inclusion body myositis as cause of progressive weakness and decline making walking difficult. Referred to rheum to ensure no other coexisting conditions. Pt has also seen NS w/o much benefit. Remains on steroids for PMR. In wheelchair mostly. Remains independent and refuses any assistance or move to assisted living etc. Lives with husband.  Referred to hematology for possible mgus.  Neck mass: still needs neck CT. Now willing to schedule.  Swalling dysfunction: needs barium swallow. At risk for aspiration.  C/o dysuria and cloudy urine x several days. No fevers or flank pain.   Assessment  1. Swallowing dysfunction   2. Mass of left side of neck   3. Inclusion body myositis   4. Polymyalgia rheumatica ([place])   5. Monoclonal gammopathy   6. Dysuria      Plan  swallowing:  evaluate for dysphagia with barium swallow CT of neck: discussed again importance.  Weakness: myositis/PMR/ per neuro. Appreciate assistance. To rheum and heme for further eval.  Check for UTI and treat.   Follow up:  3 mo for recheck Visit date not found  Orders Placed This Encounter  Procedures   Urine Culture   POCT Urinalysis Dipstick   No orders of the defined types were placed in this encounter.     I reviewed the patients updated PMH, FH, and SocHx.    Patient Active Problem List   Diagnosis Date Noted   Age-related osteoporosis with current pathological fracture 02/06/2021    Priority: High   SSS (sick sinus syndrome) ([place]) 02/11/2019    Priority: High   Status post placement of cardiac pacemaker 07/08/2018    Priority: High   Inclusion  body myositis 04/30/2018    Priority: High   Nontoxic multinodular goiter 09/08/2015    Priority: High   Current use of long term anticoagulation 05/16/2015    Priority: High   (HFpEF) heart failure with preserved ejection fraction ([place]) 09/13/2014    Priority: High   Pacemaker 11/09/2011    Priority: High   Impaired fasting glucose 05/23/2009    Priority: High   Long term current use of systemic steroids 03/30/2008    Priority: High   Polymyalgia rheumatica ([place]) 03/30/2008    Priority: High   Benign essential hypertension 05/05/2007    Priority: High   Persistent atrial fibrillation ([place]) 11/06/2006    Priority: High   Pure hypercholesterolemia 11/06/2006    Priority: High   Hypothyroidism due to Hashimoto's thyroiditis, Rx Levothyroxine 06/25/2005    Priority: High   Primary osteoarthritis of right knee 10/23/2019    Priority: Medium    Nonrheumatic mitral valve regurgitation 09/16/2015    Priority: Medium    Recurrent UTI 03/14/2015    Priority: Medium    Neuralgia, postherpetic 07/02/2019    Priority: Low   OAB (overactive bladder) 07/02/2019    Priority: Low   Constipation 01/18/2019    Priority: Low   Degenerative lumbar spinal stenosis 05/30/2022   Encounter to establish care 01/14/2022   Sensorineural hearing loss (SNHL) of both ears 01/14/2022   Bilateral impacted cerumen 01/14/2022   Hashimoto's thyroiditis 06/15/2021   History of vitamin D deficiency 06/15/2021  Frequent falls 05/17/2021   Compression fracture of body of thoracic vertebra (HCC) 02/06/2021   Shellfish allergy 11/22/2015   Synovial cyst of popliteal space 11/22/2008   Current Meds  Medication Sig   acetaminophen (TYLENOL) 500 MG tablet Take 500 mg by mouth every 6 (six) hours as needed for mild pain or headache.   apixaban (ELIQUIS) 2.5 MG TABS tablet Take 1 tablet (2.5 mg total) by mouth 2 (two) times daily.   Cholecalciferol (VITAMIN D3) 25 MCG (1000 UT) CAPS Take 1 capsule by mouth  daily.   digoxin (LANOXIN) 0.125 MG tablet TAKE 1 TABLET BY MOUTH DAILY   dorzolamide-timolol (COSOPT) 22.3-6.8 MG/ML ophthalmic solution Place 1 drop into both eyes 2 (two) times daily.   hydrochlorothiazide (HYDRODIURIL) 25 MG tablet TAKE 1 TABLET BY MOUTH  DAILY   latanoprost (XALATAN) 0.005 % ophthalmic solution Place 1 drop into both eyes at bedtime.   levothyroxine (SYNTHROID) 112 MCG tablet TAKE 1 TABLET BY MOUTH DAILY  BEFORE BREAKFAST   methocarbamol (ROBAXIN) 500 MG tablet Take 1 tablet (500 mg total) by mouth every 8 (eight) hours as needed for muscle spasms.   metoprolol succinate (TOPROL-XL) 50 MG 24 hr tablet TAKE 1 TABLET BY MOUTH DAILY   Nitroglycerin 0.4 % OINT Apply 1 inch intra-anally twice a day for 2 weeks   predniSONE (DELTASONE) 1 MG tablet TAKE 1 TABLET BY MOUTH DAILY    Allergies: Patient is allergic to shellfish-derived products, epinephrine, monosodium glutamate, ciprofloxacin, erythromycin, monosodium glutamate, sulfa antibiotics, sulfamethoxazole-trimethoprim, and tetracyclines & related. Family History: Patient family history includes Cirrhosis (age of onset: 26) in her father; Early death (age of onset: 72) in her mother; Hypertension in her brother; Lung cancer (age of onset: 47) in her brother; Scleroderma in her daughter. Social History:  Patient  reports that she has quit smoking. Her smoking use included cigarettes. She has never used smokeless tobacco. She reports that she does not currently use alcohol. She reports that she does not currently use drugs.  Review of Systems: Constitutional: Negative for fever malaise or anorexia Cardiovascular: negative for chest pain Respiratory: negative for SOB or persistent cough Gastrointestinal: negative for abdominal pain  Objective  Vitals: BP (!) 174/74   Pulse 72   Temp 98 F (36.7 C)   Ht '5\' 4"'$  (1.626 m)   Wt 127 lb (57.6 kg) Comment: Pt reported  SpO2 96%   BMI 21.80 kg/m  General: no acute distress ,  A&Ox3 Neck: firm left sided upper neck mass RRR CTA In wheelchair  Commons side effects, risks, benefits, and alternatives for medications and treatment plan prescribed today were discussed, and the patient expressed understanding of the given instructions. Patient is instructed to call or message via MyChart if he/she has any questions or concerns regarding our treatment plan. No barriers to understanding were identified. We discussed Red Flag symptoms and signs in detail. Patient expressed understanding regarding what to do in case of urgent or emergency type symptoms.  Medication list was reconciled, printed and provided to the patient in AVS. Patient instructions and summary information was reviewed with the patient as documented in the AVS. This note was prepared with assistance of Dragon voice recognition software. Occasional wrong-word or sound-a-like substitutions may have occurred due to the inherent limitations of voice recognition software

## 2022-08-29 NOTE — Patient Instructions (Signed)
Please return in 3 months for recheck.   Please get the neck CT and swallowing done.  I will await to see what the rheumatologist and hematologist say after they see you.   If you have any questions or concerns, please don't hesitate to send me a message via MyChart or call the office at [phone]. Thank you for visiting with [place] today! It's our pleasure caring for you.

## 2022-08-31 ENCOUNTER — Other Ambulatory Visit: Payer: Self-pay

## 2022-08-31 ENCOUNTER — Telehealth: Payer: Self-pay | Admitting: Family Medicine

## 2022-08-31 DIAGNOSIS — N390 Urinary tract infection, site not specified: Secondary | ICD-10-CM

## 2022-08-31 LAB — URINE CULTURE
MICRO NUMBER:: 14439131
SPECIMEN QUALITY:: ADEQUATE

## 2022-08-31 MED ORDER — AMOXICILLIN 875 MG PO TABS: 875.0000 mg | ORAL_TABLET | Freq: Two times a day (BID) | ORAL | 0 refills | Status: DC

## 2022-08-31 NOTE — Telephone Encounter (Signed)
See lab result note. To order abx. Amox.

## 2022-08-31 NOTE — Telephone Encounter (Signed)
Patient is requesting a callback in regards to results. Would like a medication sent in due to ongoing symptoms.

## 2022-08-31 NOTE — Progress Notes (Signed)
Please call patient: I have reviewed his/her lab results. Please order amoxicillin '875mg'$  bid and call pt to notify her. Still need urine culture results to ensure correct antibiotic but they are not back yet.

## 2022-09-03 ENCOUNTER — Other Ambulatory Visit: Payer: Self-pay

## 2022-09-03 MED ORDER — NITROFURANTOIN MONOHYD MACRO 100 MG PO CAPS: 100.0000 mg | ORAL_CAPSULE | Freq: Two times a day (BID) | ORAL | 0 refills | Status: DC

## 2022-09-03 NOTE — Telephone Encounter (Signed)
Kristopher Oppenheim called and states they need a call back for some clarification on a med. Please advise.  [phone] - Happiness (yes, that is her name)

## 2022-09-03 NOTE — Telephone Encounter (Signed)
Changed to Macrobid 100 twice daily for 5 days.  Please notify patient.  This is a capsule and she should be able to swallow.

## 2022-09-03 NOTE — Telephone Encounter (Signed)
Patients pharmacy called - [facility]- Stated patient would like to be switched to CEPHALEXIN '500mg'$  - stated she used it before and would like to continue with it.

## 2022-09-03 NOTE — Telephone Encounter (Signed)
Patient states: - She received the amoxicillin but the pills are too big for her to swallow   Patient requests: - A different medication that's easier to take or a liquid version of the medication initially sent in

## 2022-09-03 NOTE — Telephone Encounter (Signed)
Pt has been informed of the new medication change to Macrobid.

## 2022-09-11 ENCOUNTER — Ambulatory Visit: Payer: Medicare Other | Attending: Internal Medicine | Admitting: Internal Medicine

## 2022-09-11 ENCOUNTER — Encounter: Payer: Self-pay | Admitting: Internal Medicine

## 2022-09-11 VITALS — BP 134/68 | HR 61 | Ht 64.0 in | Wt 126.0 lb

## 2022-09-11 DIAGNOSIS — I4819 Other persistent atrial fibrillation: Secondary | ICD-10-CM | POA: Diagnosis not present

## 2022-09-11 DIAGNOSIS — I495 Sick sinus syndrome: Secondary | ICD-10-CM | POA: Insufficient documentation

## 2022-09-11 DIAGNOSIS — Z950 Presence of cardiac pacemaker: Secondary | ICD-10-CM | POA: Insufficient documentation

## 2022-09-11 DIAGNOSIS — I872 Venous insufficiency (chronic) (peripheral): Secondary | ICD-10-CM | POA: Diagnosis not present

## 2022-09-11 NOTE — Progress Notes (Signed)
HPI Ms. Mangrum returns today for ongoing evaluation of her heart block, PAF, chronic peripheral edema, and HTN, s/ PPM insertion. She c/o cold feet and lower legs as well as peripheral edema. She has chronic dyspnea. No syncope. She is s/p PM gen change out.  Allergies  Allergen Reactions   Shellfish-Derived Products Shortness Of Breath and Other (See Comments)    Lips go numb   Epinephrine Other (See Comments) and Hypertension    "Makes me feel wired"   Monosodium Glutamate Other (See Comments)    other   Ciprofloxacin Other (See Comments)    Allergic reaction not recalled   Erythromycin Other (See Comments)    Allergic reaction not recalled   Monosodium Glutamate Other (See Comments) and Hypertension    "Makes me feel badly"   Sulfa Antibiotics Other (See Comments)    Allergic reaction not recalled   Sulfamethoxazole-Trimethoprim    Tetracyclines & Related Other (See Comments)    Allergic reaction not recalled     Current Outpatient Medications  Medication Sig Dispense Refill   acetaminophen (TYLENOL) 500 MG tablet Take 500 mg by mouth every 6 (six) hours as needed for mild pain or headache.     apixaban (ELIQUIS) 2.5 MG TABS tablet Take 1 tablet (2.5 mg total) by mouth 2 (two) times daily. 180 tablet 2   Cholecalciferol (VITAMIN D3) 25 MCG (1000 UT) CAPS Take 1 capsule by mouth daily.     digoxin (LANOXIN) 0.125 MG tablet TAKE 1 TABLET BY MOUTH DAILY 90 tablet 3   dorzolamide-timolol (COSOPT) 22.3-6.8 MG/ML ophthalmic solution Place 1 drop into both eyes 2 (two) times daily.     hydrochlorothiazide (HYDRODIURIL) 25 MG tablet TAKE 1 TABLET BY MOUTH  DAILY 90 tablet 3   latanoprost (XALATAN) 0.005 % ophthalmic solution Place 1 drop into both eyes at bedtime.     levothyroxine (SYNTHROID) 112 MCG tablet TAKE 1 TABLET BY MOUTH DAILY  BEFORE BREAKFAST 90 tablet 2   methocarbamol (ROBAXIN) 500 MG tablet Take 1 tablet (500 mg total) by mouth every 8 (eight) hours as needed  for muscle spasms. 20 tablet 0   metoprolol succinate (TOPROL-XL) 50 MG 24 hr tablet TAKE 1 TABLET BY MOUTH DAILY 90 tablet 3   nitrofurantoin, macrocrystal-monohydrate, (MACROBID) 100 MG capsule Take 1 capsule (100 mg total) by mouth 2 (two) times daily. 10 capsule 0   nitrofurantoin, macrocrystal-monohydrate, (MACROBID) 100 MG capsule Take 1 capsule (100 mg total) by mouth 2 (two) times daily. 10 capsule 0   Nitroglycerin 0.4 % OINT Apply 1 inch intra-anally twice a day for 2 weeks 30 g 0   predniSONE (DELTASONE) 1 MG tablet TAKE 1 TABLET BY MOUTH DAILY 90 tablet 3   No current facility-administered medications for this visit.     Past Medical History:  Diagnosis Date   Cataract    Hypertension    Moderate aortic stenosis    Moderate mitral regurgitation    Persistent atrial fibrillation (HCC)    PMR (polymyalgia rheumatica) (HCC)    Second degree AV block    Sick sinus syndrome (HCC)    Thyroid disease     ROS:   All systems reviewed and negative except as noted in the HPI.   Past Surgical History:  Procedure Laterality Date   ABDOMINAL HYSTERECTOMY     APPENDECTOMY     CESAREAN SECTION     x 3   CHOLECYSTECTOMY     PACEMAKER GENERATOR CHANGE  11/20/2018  [employer] Accolade MRI PPM implanted in [place] CYST EXCISION       Family History  Problem Relation Age of Onset   Early death Mother 31   Hypertension Brother    Cirrhosis Father 28   Lung cancer Brother 70   Scleroderma Daughter      Social History   Socioeconomic History   Marital status: Married    Spouse name: Not on file   Number of children: Not on file   Years of education: Not on file   Highest education level: Not on file  Occupational History   Occupation: Retired     Comment: Oncologist   Tobacco Use   Smoking status: Former    Types: Cigarettes   Smokeless tobacco: Never  Substance and Sexual Activity   Alcohol use: Not  Currently   Drug use: Not Currently   Sexual activity: Not Currently    Birth control/protection: Post-menopausal  Other Topics Concern   Not on file  Social History Narrative   ** Merged History Encounter **       Recently moved from [place]. In independent living with her husband. Daughter died from scleroderma complications. Hx of falls, but last was > 1.5 years ago. 01/18/2019    Social Determinants of Health   Financial Resource Strain: Low Risk  (11/14/2021)   Overall Financial Resource Strain (CARDIA)    Difficulty of Paying Living Expenses: Not hard at all  Food Insecurity: No Food Insecurity (04/11/2022)   Hunger Vital Sign    Worried About Running Out of Food in the Last Year: Never true    Ran Out of Food in the Last Year: Never true  Transportation Needs: No Transportation Needs (04/11/2022)   PRAPARE - Hydrologist (Medical): No    Lack of Transportation (Non-Medical): No  Physical Activity: Inactive (11/14/2021)   Exercise Vital Sign    Days of Exercise per Week: 0 days    Minutes of Exercise per Session: 0 min  Stress: Stress Concern Present (11/14/2021)   [place]    Feeling of Stress : Rather much  Social Connections: Moderately Integrated (11/14/2021)   Social Connection and Isolation Panel [NHANES]    Frequency of Communication with Friends and Family: More than three times a week    Frequency of Social Gatherings with Friends and Family: More than three times a week    Attends Religious Services: More than 4 times per year    Active Member of [employer] or Organizations: No    Attends Archivist Meetings: Never    Marital Status: Married  Human resources officer Violence: Not At Risk (04/11/2022)   Humiliation, Afraid, Rape, and Kick questionnaire    Fear of Current or Ex-Partner: No    Emotionally Abused: No    Physically Abused: No    Sexually Abused: No     BP  134/68   Pulse 61   Ht '5\' 4"'$  (1.626 m)   Wt 126 lb (57.2 kg)   SpO2 95%   BMI 21.63 kg/m   Physical Exam:  Well appearing NAD HEENT: Unremarkable Neck:  No JVD, no thyromegally Lymphatics:  No adenopathy Back:  No CVA tenderness Lungs:  Clear with no wheezes HEART:  Regular rate rhythm, no murmurs, no rubs, no clicks Abd:  soft, positive bowel sounds, no organomegally, no rebound, no guarding Ext:  2 plus  pulses, 2+ peripheral edema, no cyanosis, no clubbing Skin:  No rashes no nodules Neuro:  CN II through XII intact, motor grossly intact  EKG - atrial fib with ventricular pacing  DEVICE  Normal device function.  See PaceArt for details.   Assess/Plan: Atrial fib - this is now permanent. Her VR is well controlled.  Sinus node dysfunction - she is now in atrial fib all of the time. No symptoms PPM -her [employer] device is working normal. Peripheral edema - this appears to be worse. I have discussed keeping her leg elevated. I have asked her to see Dr. Gae Gallop. Her feet are very cold and I worry about both small and large vessel disease.   Carleene Overlie Baylin Gamblin,MD

## 2022-09-11 NOTE — Patient Instructions (Addendum)
Medication Instructions:  Your physician recommends that you continue on your current medications as directed. Please refer to the Current Medication list given to you today.  *If you need a refill on your cardiac medications before your next appointment, please call your pharmacy*  Lab Work: None ordered.  If you have labs (blood work) drawn today and your tests are completely normal, you will receive your results only by: [place] (if you have MyChart) OR A paper copy in the mail If you have any lab test that is abnormal or we need to change your treatment, we will call you to review the results.  Testing/Procedures: Dr. Cristopher Peru ordered a 2D Echocardiogram.    Follow-Up: Per Dr. Cristopher Peru, Patient should be elevating her lower extremities ( LEGS ) daily.      Your next appointment:   You have been referred to Dr. Joylene Igo, Vascular Surgeon, for Severe Venous Insufficiency.  A referral order has been submitted.   The format for your next appointment:   In Person  Provider:   Cristopher Peru, MD{or one of the following Advanced Practice Providers on your designated Care Team:   Tommye Standard, [place] Legrand Como "[name]" Chalmers Cater, [place]  Remote monitoring is used to monitor your Pacemaker from home. This monitoring reduces the number of office visits required to check your device to one time per year. It allows [place] to keep an eye on the functioning of your device to ensure it is working properly. You are scheduled for a device check from home on 11/07/2022. You may send your transmission at any time that day. If you have a wireless device, the transmission will be sent automatically. After your physician reviews your transmission, you will receive a postcard with your next transmission date.

## 2022-09-12 ENCOUNTER — Ambulatory Visit (HOSPITAL_COMMUNITY)
Admission: RE | Admit: 2022-09-12 | Discharge: 2022-09-12 | Disposition: A | Discharge status: Home / Self Care | Payer: Medicare Other | Source: Ambulatory Visit | Attending: Family Medicine | Admitting: Family Medicine

## 2022-09-12 ENCOUNTER — Encounter (HOSPITAL_COMMUNITY): Payer: Medicare Other

## 2022-09-12 ENCOUNTER — Ambulatory Visit (HOSPITAL_COMMUNITY): Payer: Medicare Other

## 2022-09-12 DIAGNOSIS — R221 Localized swelling, mass and lump, neck: Secondary | ICD-10-CM | POA: Insufficient documentation

## 2022-09-12 MED ORDER — IOHEXOL 350 MG/ML SOLN
75.0000 mL | Freq: Once | INTRAVENOUS | Status: AC | PRN
  Administered 2022-09-12: 75 mL via INTRAVENOUS

## 2022-09-20 ENCOUNTER — Other Ambulatory Visit: Payer: Self-pay | Admitting: *Deleted

## 2022-09-20 DIAGNOSIS — M79605 Pain in left leg: Secondary | ICD-10-CM

## 2022-09-25 ENCOUNTER — Encounter (HOSPITAL_COMMUNITY): Payer: Medicare Other

## 2022-09-25 ENCOUNTER — Ambulatory Visit (HOSPITAL_COMMUNITY): Payer: Medicare Other

## 2022-09-27 ENCOUNTER — Inpatient Hospital Stay: Payer: Medicare Other | Attending: Oncology | Admitting: Oncology

## 2022-09-27 ENCOUNTER — Other Ambulatory Visit: Payer: Self-pay

## 2022-09-27 ENCOUNTER — Inpatient Hospital Stay: Payer: Medicare Other

## 2022-09-27 VITALS — BP 137/68 | HR 62 | Temp 98.2°F | Resp 18 | Ht 64.0 in | Wt 127.0 lb

## 2022-09-27 DIAGNOSIS — Z79899 Other long term (current) drug therapy: Secondary | ICD-10-CM | POA: Diagnosis not present

## 2022-09-27 DIAGNOSIS — D472 Monoclonal gammopathy: Secondary | ICD-10-CM

## 2022-09-27 DIAGNOSIS — I10 Essential (primary) hypertension: Secondary | ICD-10-CM | POA: Diagnosis not present

## 2022-09-27 DIAGNOSIS — Z87891 Personal history of nicotine dependence: Secondary | ICD-10-CM | POA: Diagnosis not present

## 2022-09-27 DIAGNOSIS — I4819 Other persistent atrial fibrillation: Secondary | ICD-10-CM | POA: Diagnosis not present

## 2022-09-27 DIAGNOSIS — E039 Hypothyroidism, unspecified: Secondary | ICD-10-CM | POA: Insufficient documentation

## 2022-09-27 LAB — CMP (CANCER CENTER ONLY)
ALT: 26 U/L (ref 0–44)
AST: 32 U/L (ref 15–41)
Albumin: 3.8 g/dL (ref 3.5–5.0)
Alkaline Phosphatase: 52 U/L (ref 38–126)
Anion gap: 8 (ref 5–15)
BUN: 16 mg/dL (ref 8–23)
CO2: 28 mmol/L (ref 22–32)
Calcium: 10.8 mg/dL — ABNORMAL HIGH (ref 8.9–10.3)
Chloride: 100 mmol/L (ref 98–111)
Creatinine: <0.3 mg/dL — ABNORMAL LOW (ref 0.44–1.00)
Glucose, Bld: 106 mg/dL — ABNORMAL HIGH (ref 70–99)
Potassium: 3.7 mmol/L (ref 3.5–5.1)
Sodium: 136 mmol/L (ref 135–145)
Total Bilirubin: 0.9 mg/dL (ref 0.3–1.2)
Total Protein: 7.9 g/dL (ref 6.5–8.1)

## 2022-09-27 LAB — CBC WITH DIFFERENTIAL (CANCER CENTER ONLY)
Abs Immature Granulocytes: 0.01 10*3/uL (ref 0.00–0.07)
Basophils Absolute: 0 10*3/uL (ref 0.0–0.1)
Basophils Relative: 1 %
Eosinophils Absolute: 0.1 10*3/uL (ref 0.0–0.5)
Eosinophils Relative: 1 %
HCT: 40.2 % (ref 36.0–46.0)
Hemoglobin: 13.6 g/dL (ref 12.0–15.0)
Immature Granulocytes: 0 %
Lymphocytes Relative: 36 %
Lymphs Abs: 1.9 10*3/uL (ref 0.7–4.0)
MCH: 32 pg (ref 26.0–34.0)
MCHC: 33.8 g/dL (ref 30.0–36.0)
MCV: 94.6 fL (ref 80.0–100.0)
Monocytes Absolute: 0.4 10*3/uL (ref 0.1–1.0)
Monocytes Relative: 8 %
Neutro Abs: 2.8 10*3/uL (ref 1.7–7.7)
Neutrophils Relative %: 54 %
Platelet Count: 224 10*3/uL (ref 150–400)
RBC: 4.25 MIL/uL (ref 3.87–5.11)
RDW: 13.2 % (ref 11.5–15.5)
WBC Count: 5.2 10*3/uL (ref 4.0–10.5)
nRBC: 0 % (ref 0.0–0.2)

## 2022-09-27 NOTE — Progress Notes (Signed)
[facility] Patient Consult   Requesting MD: Shellia Carwin, [address] [place],  [place] [id number]   Deanna Rivera 89 y.o.  1933/04/13    Reason for Consult: Serum monoclonal protein   HPI: Deanna Rivera has a complex history including a longstanding diagnosis of inclusion body myositis.  She has progressive motor weakness. She is followed by neurology.  A serologic evaluation on 08/14/2022 field a positive ANA with a negative dsDNA, elevated IgA level, and a serum monoclonal IgG lambda protein.  She was newly noted to have a "mass "in the left neck by [name].  A CT of the neck on 09/13/2022 revealed an enhancing posterior thyroid nodule felt to potentially represent a small parathyroid adenoma.  There is asymmetric prominence of lymph nodes in the left neck measuring up to 8 mm.  No evidence of a mass to correlate with the palpable abnormality.  Past Medical History:  Diagnosis Date   Cataract    Hypertension    Moderate aortic stenosis    Moderate mitral regurgitation    Persistent atrial fibrillation (HCC)    PMR (polymyalgia rheumatica) (HCC)    Second degree AV block    Sick sinus syndrome (HCC)    Thyroid disease     .  Inclusion body myositis   .  G3, P3   .  Bilateral hearing loss   .  Hypercholesterolemia   .  Osteoporosis  Past Surgical History:  Procedure Laterality Date   ABDOMINAL HYSTERECTOMY     APPENDECTOMY     CESAREAN SECTION     x 3   CHOLECYSTECTOMY     PACEMAKER GENERATOR CHANGE  11/20/2018   [employer] Accolade MRI PPM implanted in [place] CYST EXCISION      Medications: Reviewed  Allergies:  Allergies  Allergen Reactions   Shellfish-Derived Products Shortness Of Breath and Other (See Comments)    Lips go numb   Epinephrine Other (See Comments) and Hypertension    "Makes me feel wired"   Monosodium Glutamate Other (See Comments)    other   Ciprofloxacin  Other (See Comments)    Allergic reaction not recalled   Erythromycin Other (See Comments)    Allergic reaction not recalled   Monosodium Glutamate Other (See Comments) and Hypertension    "Makes me feel badly"   Sulfa Antibiotics Other (See Comments)    Allergic reaction not recalled   Sulfamethoxazole-Trimethoprim    Tetracyclines & Related Other (See Comments)    Allergic reaction not recalled    Family history: No family history of cancer  Social History:   She lives with her husband in a retirement community in [place].  She is retired Museum/gallery exhibitions officer.  She quit smoking cigarettes greater than 40 years ago.  No alcohol use.  No transfusion history.  No risk factor for hepatitis.  ROS:   Positives include: Feet swell when she eats salt, solid and liquid dysphagia, constipation, urinary symptoms, urinary tract infection 1 month ago, right rotator cuff tear, "mass "in the left neck were noted 1 month ago  A complete ROS was otherwise negative.  Physical Exam:  Blood pressure 137/68, pulse 62, temperature 98.2 F (36.8 C), temperature source Oral, resp. rate 18, height 5' 4"$  (1.626 m), weight 127 lb (57.6 kg), SpO2 99 %.  HEENT: Oropharynx without visible mass, neck without mass, there is firm prominence at the left mid  neck-asymmetric hyoid prominence?,  No discrete mass Lungs: Clear bilaterally Cardiac: Regular rate and rhythm, 2/6 systolic murmur Abdomen: No hepatosplenomegaly, nontender  Vascular: Edema Lymph nodes: No cervical, supraclavicular, axillary, or inguinal nodes Neurologic: Alert and oriented, she moves all extremities.  3-4/5 strength with flexion at the hips, 0-1 dorsiflexion at the feet, 4/5 strength in the arms, 3-4/5 strength in the hands Musculoskeletal: No spine tenderness   LAB:  CBC  Lab Results  Component Value Date   WBC 4.3 05/03/2022   HGB 14.0 05/03/2022   HCT 40.8 05/03/2022   MCV 95.6 05/03/2022   PLT 207.0 05/03/2022    NEUTROABS 2.1 05/03/2022        CMP  Lab Results  Component Value Date   NA 137 05/03/2022   K 3.8 05/03/2022   CL 100 05/03/2022   CO2 31 05/03/2022   GLUCOSE 87 05/03/2022   BUN 22 05/03/2022   CREATININE 0.30 (L) 05/03/2022   CALCIUM 10.5 05/03/2022   PROT 8.1 05/03/2022   ALBUMIN 3.7 05/03/2022   AST 35 05/03/2022   ALT 30 05/03/2022   ALKPHOS 54 05/03/2022   BILITOT 0.8 05/03/2022   GFRNONAA >60 09/04/2021   GFRAA 116 07/28/2020        Assessment/Plan:   Serum monoclonal IgG protein Normal IgM, elevated IgA level 08/14/2022 Inclusion body myositis Arm/hand/leg weakness secondary to #2 Atrial fibrillation-maintained on PICs abandon Hypothyroidism Osteoporosis Possible left neck mass noted on exam January 2024 CT neck 09/13/2022-no discrete mass noted, enhancing nodule at the posterior margin of the left thyroid may represent a small parathyroid adenoma, asymmetric prominent lymph nodes in the left neck-reactive? 8.   Solid/liquid dysphagia-potentially related to inclusion body myositis   Disposition:   Deanna Rivera is referred for evaluation of a serum monoclonal IgG protein.  The monoclonal protein likely represents a monoclonal gammopathy of unknown significance.  I have a low clinical suspicion for multiple myeloma or another lymphoproliferative disorder.  The normal IgM level, elevated IgA, and lack of a history of anemia argue against a diagnosis of myeloma.  The possible mass in the left neck most likely represents prominence of the neck bony structures.  She is wheelchair-bound secondary to progressive muscle weakness.  We will obtain additional diagnostic evaluation today to include a CBC, chemistry panel, serum protein electrophoresis, and serum free light chains.  She will return for an office and lab visit in 4-5 months.  She indicated she does not wish to undergo extensive diagnostic evaluations.  Betsy Coder, MD  09/27/2022, 1:52 PM

## 2022-09-28 ENCOUNTER — Telehealth: Payer: Self-pay

## 2022-09-28 NOTE — Telephone Encounter (Signed)
-----   Message from Ladell Pier, MD sent at 09/27/2022  4:22 PM EST ----- Please call patient, the hemoglobin is normal, kidney function appears normal, the calcium is mildly elevated The elevated calcium may be related to HCTZ therapy, the calcium was also elevated and May 2023 Add PTH level to today's lab, also add SPEP Repeat BMP when she is here for her husband's appointment next month

## 2022-09-28 NOTE — Telephone Encounter (Signed)
Deanna Rivera gave verbal understanding and had no further questions or concerns.

## 2022-09-29 LAB — PROTEIN ELECTROPHORESIS, SERUM: Total Protein ELP: UNDETERMINED g/dL

## 2022-09-29 LAB — PTH, INTACT AND CALCIUM

## 2022-10-01 ENCOUNTER — Other Ambulatory Visit: Payer: Self-pay

## 2022-10-01 ENCOUNTER — Telehealth: Payer: Self-pay

## 2022-10-01 DIAGNOSIS — D472 Monoclonal gammopathy: Secondary | ICD-10-CM

## 2022-10-01 LAB — KAPPA/LAMBDA LIGHT CHAINS
Kappa free light chain: 50.8 mg/L — ABNORMAL HIGH (ref 3.3–19.4)
Kappa, lambda light chain ratio: 0.77 (ref 0.26–1.65)
Lambda free light chains: 65.8 mg/L — ABNORMAL HIGH (ref 5.7–26.3)

## 2022-10-01 NOTE — Telephone Encounter (Signed)
-----   Message from Ladell Pier, MD sent at 09/30/2022  4:59 PM EST ----- Add PTH with next lab

## 2022-10-01 NOTE — Telephone Encounter (Signed)
Added PTH for lab 10/23/22

## 2022-10-02 ENCOUNTER — Other Ambulatory Visit: Payer: Self-pay | Admitting: *Deleted

## 2022-10-02 DIAGNOSIS — D472 Monoclonal gammopathy: Secondary | ICD-10-CM

## 2022-10-03 ENCOUNTER — Other Ambulatory Visit (HOSPITAL_COMMUNITY): Payer: Medicare Other

## 2022-10-04 ENCOUNTER — Encounter: Payer: Self-pay | Admitting: *Deleted

## 2022-10-04 ENCOUNTER — Encounter (HOSPITAL_COMMUNITY): Payer: Medicare Other

## 2022-10-04 ENCOUNTER — Ambulatory Visit (HOSPITAL_COMMUNITY): Admission: RE | Admit: 2022-10-04 | Payer: Medicare Other | Source: Ambulatory Visit

## 2022-10-04 NOTE — Progress Notes (Signed)
Notified Mrs. White of lab results of 09/24/22 and that Dr. Benay Spice said her light chains are most likely elevated due to renal insufficiency and inflammation due to her diagnosis of polymyalgia rheumatica. Will re-check on 3/12.

## 2022-10-09 ENCOUNTER — Ambulatory Visit (HOSPITAL_COMMUNITY)
Admission: RE | Admit: 2022-10-09 | Discharge: 2022-10-09 | Disposition: A | Discharge status: Home / Self Care | Payer: Medicare Other | Source: Ambulatory Visit | Attending: Vascular Surgery | Admitting: Vascular Surgery

## 2022-10-09 ENCOUNTER — Ambulatory Visit (INDEPENDENT_AMBULATORY_CARE_PROVIDER_SITE_OTHER): Payer: Medicare Other | Admitting: Vascular Surgery

## 2022-10-09 ENCOUNTER — Encounter: Payer: Self-pay | Admitting: Vascular Surgery

## 2022-10-09 VITALS — BP 151/70 | HR 67 | Temp 97.9°F | Resp 20 | Ht 64.0 in | Wt 127.0 lb

## 2022-10-09 DIAGNOSIS — M79605 Pain in left leg: Secondary | ICD-10-CM

## 2022-10-09 DIAGNOSIS — I872 Venous insufficiency (chronic) (peripheral): Secondary | ICD-10-CM | POA: Diagnosis not present

## 2022-10-09 DIAGNOSIS — M79604 Pain in right leg: Secondary | ICD-10-CM | POA: Diagnosis not present

## 2022-10-09 DIAGNOSIS — M7989 Other specified soft tissue disorders: Secondary | ICD-10-CM

## 2022-10-09 NOTE — Progress Notes (Signed)
[facility]  ASSESSMENT / PLAN: Deanna Rivera is a 89 y.o. female with chronic venous insufficiency of bilateral lower extremities causing edema (C3 disease).  Venous duplex is significant for GSV reflux Recommend compression and elevation for symptomatic relief. Patient is not a candidate for endovenous ablation given nonambulatory status.  Follow up with me as needed.  CHIEF COMPLAINT: swollen legs  HISTORY OF PRESENT ILLNESS: Deanna Rivera is a 89 y.o. female nonambulatory woman with heart failure. She presents to clinic for evaluation of leg swelling. She reports no history of venous ulceration. She notices prominent veins about her calves and ankles bilaterally. She strongly prefers to avoid intervention.    Past Medical History:  Diagnosis Date   Cataract    Hypertension    Moderate aortic stenosis    Moderate mitral regurgitation    Persistent atrial fibrillation (HCC)    PMR (polymyalgia rheumatica) (HCC)    Second degree AV block    Sick sinus syndrome (HCC)    Thyroid disease     Past Surgical History:  Procedure Laterality Date   ABDOMINAL HYSTERECTOMY     APPENDECTOMY     CESAREAN SECTION     x 3   CHOLECYSTECTOMY     PACEMAKER GENERATOR CHANGE  11/20/2018   [employer] Accolade MRI PPM implanted in [place] CYST EXCISION      Family History  Problem Relation Age of Onset   Early death Mother 35   Hypertension Brother    Cirrhosis Father 60   Lung cancer Brother 88   Scleroderma Daughter     Social History   Socioeconomic History   Marital status: Married    Spouse name: Not on file   Number of children: Not on file   Years of education: Not on file   Highest education level: Not on file  Occupational History   Occupation: Retired     Comment: Oncologist   Tobacco Use   Smoking status: Former    Types: Cigarettes   Smokeless tobacco: Never  Substance and  Sexual Activity   Alcohol use: Not Currently   Drug use: Not Currently   Sexual activity: Not Currently    Birth control/protection: Post-menopausal  Other Topics Concern   Not on file  Social History Narrative   ** Merged History Encounter **       Recently moved from [place]. In independent living with her husband. Daughter died from scleroderma complications. Hx of falls, but last was > 1.5 years ago. 01/18/2019    Social Determinants of Health   Financial Resource Strain: Low Risk  (11/14/2021)   Overall Financial Resource Strain (CARDIA)    Difficulty of Paying Living Expenses: Not hard at all  Food Insecurity: No Food Insecurity (09/27/2022)   Hunger Vital Sign    Worried About Running Out of Food in the Last Year: Never true    Ran Out of Food in the Last Year: Never true  Transportation Needs: No Transportation Needs (09/27/2022)   PRAPARE - Hydrologist (Medical): No    Lack of Transportation (Non-Medical): No  Physical Activity: Inactive (11/14/2021)   Exercise Vital Sign    Days of Exercise per Week: 0 days    Minutes of Exercise per Session: 0 min  Stress: Stress Concern Present (11/14/2021)   [place]    Feeling of Stress :  Rather much  Social Connections: Moderately Integrated (11/14/2021)   Social Connection and Isolation Panel [NHANES]    Frequency of Communication with Friends and Family: More than three times a week    Frequency of Social Gatherings with Friends and Family: More than three times a week    Attends Religious Services: More than 4 times per year    Active Member of [employer] or Organizations: No    Attends Archivist Meetings: Never    Marital Status: Married  Human resources officer Violence: Not At Risk (09/27/2022)   Humiliation, Afraid, Rape, and Kick questionnaire    Fear of Current or Ex-Partner: No    Emotionally Abused: No    Physically Abused: No     Sexually Abused: No    Allergies  Allergen Reactions   Shellfish-Derived Products Shortness Of Breath and Other (See Comments)    Lips go numb   Epinephrine Other (See Comments) and Hypertension    "Makes me feel wired"   Monosodium Glutamate Other (See Comments)    other   Ciprofloxacin Other (See Comments)    Allergic reaction not recalled   Erythromycin Other (See Comments)    Allergic reaction not recalled   Monosodium Glutamate Other (See Comments) and Hypertension    "Makes me feel badly"   Sulfa Antibiotics Other (See Comments)    Allergic reaction not recalled   Sulfamethoxazole-Trimethoprim    Tetracyclines & Related Other (See Comments)    Allergic reaction not recalled    Current Outpatient Medications  Medication Sig Dispense Refill   acetaminophen (TYLENOL) 500 MG tablet Take 500 mg by mouth every 6 (six) hours as needed for mild pain or headache.     apixaban (ELIQUIS) 2.5 MG TABS tablet Take 1 tablet (2.5 mg total) by mouth 2 (two) times daily. 180 tablet 2   Cholecalciferol (VITAMIN D3) 25 MCG (1000 UT) CAPS Take 1 capsule by mouth daily.     digoxin (LANOXIN) 0.125 MG tablet TAKE 1 TABLET BY MOUTH DAILY 90 tablet 3   dorzolamide-timolol (COSOPT) 22.3-6.8 MG/ML ophthalmic solution Place 1 drop into both eyes 2 (two) times daily.     hydrochlorothiazide (HYDRODIURIL) 25 MG tablet TAKE 1 TABLET BY MOUTH  DAILY 90 tablet 3   latanoprost (XALATAN) 0.005 % ophthalmic solution Place 1 drop into both eyes at bedtime.     levothyroxine (SYNTHROID) 112 MCG tablet TAKE 1 TABLET BY MOUTH DAILY  BEFORE BREAKFAST 90 tablet 2   metoprolol succinate (TOPROL-XL) 50 MG 24 hr tablet TAKE 1 TABLET BY MOUTH DAILY 90 tablet 3   predniSONE (DELTASONE) 1 MG tablet TAKE 1 TABLET BY MOUTH DAILY 90 tablet 3   No current facility-administered medications for this visit.    PHYSICAL EXAM Vitals:   10/09/22 1501  BP: (!) 151/70  Pulse: 67  Resp: 20  Temp: 97.9 F (36.6 C)  SpO2:  97%  Weight: 127 lb (57.6 kg)  Height: '5\' 4"'$  (1.626 m)   Elderly woman in no distress Regular rate and rhythm Unlabored breathing Prominent reticular veins 2+ edema bilaterally    PERTINENT LABORATORY AND RADIOLOGIC DATA  Most recent CBC    Latest Ref Rng & Units 09/27/2022    2:40 PM 05/03/2022   10:34 AM 12/27/2021    3:11 PM  CBC  WBC 4.0 - 10.5 K/uL 5.2  4.3  6.0   Hemoglobin 12.0 - 15.0 g/dL 13.6  14.0  13.6   Hematocrit 36.0 - 46.0 % 40.2  40.8  40.9   Platelets 150 - 400 K/uL 224  207.0  210.0      Most recent CMP    Latest Ref Rng & Units 09/27/2022    5:05 PM 09/27/2022    2:40 PM 05/03/2022   10:34 AM  CMP  Glucose 70 - 99 mg/dL  106  87   BUN 8 - 23 mg/dL  16  22   Creatinine 0.44 - 1.00 mg/dL  <0.30  0.30   Sodium 135 - 145 mmol/L  136  137   Potassium 3.5 - 5.1 mmol/L  3.7  3.8   Chloride 98 - 111 mmol/L  100  100   CO2 22 - 32 mmol/L  28  31   Calcium mg/dL NSPR  10.8  10.5   Total Protein 6.5 - 8.1 g/dL  7.9  8.1   Total Bilirubin 0.3 - 1.2 mg/dL  0.9  0.8   Alkaline Phos 38 - 126 U/L  52  54   AST 15 - 41 U/L  32  35   ALT 0 - 44 U/L  26  30     Renal function CrCl cannot be calculated (This lab value cannot be used to calculate CrCl because it is not a number: <0.30).  Hgb A1c MFr Bld (% of total Hgb)  Date Value  07/28/2020 5.8 (H)    LDL Cholesterol (Calc)  Date Value Ref Range Status  07/28/2020 117 (H) mg/dL (calc) Final    Comment:    Reference range: <100 . Desirable range <100 mg/dL for primary prevention;   <70 mg/dL for patients with CHD or diabetic patients  with > or = 2 CHD risk factors. Marland Kitchen LDL-C is now calculated using the Martin-Hopkins  calculation, which is a validated novel method providing  better accuracy than the Friedewald equation in the  estimation of LDL-C.  Cresenciano Genre et al. Annamaria Helling. [id number]): 2061-2068  (http://education.[facility].com/faq/FAQ164)     Yevonne Aline. Stanford Breed, MD FACS [facility] Phone Number: [phone] 10/09/2022 4:23 PM   Total time spent on preparing this encounter including chart review, data review, collecting history, examining the patient, coordinating care for this new patient, 45 minutes.  Portions of this report may have been transcribed using voice recognition software.  Every effort has been made to ensure accuracy; however, inadvertent computerized transcription errors may still be present.

## 2022-10-11 ENCOUNTER — Other Ambulatory Visit (HOSPITAL_COMMUNITY): Payer: Medicare Other

## 2022-10-15 ENCOUNTER — Other Ambulatory Visit: Payer: Self-pay | Admitting: Internal Medicine

## 2022-10-15 DIAGNOSIS — I4819 Other persistent atrial fibrillation: Secondary | ICD-10-CM

## 2022-10-15 NOTE — Telephone Encounter (Signed)
Eliquis 2.'5mg'$  refill request received. Patient is 89 years old, weight-57.6kg, Crea-<0.30 on 09/27/22, Diagnosis-Afib, and last seen by Dr. Lovena Le on 09/11/22. Dose is appropriate based on dosing criteria. Will send in refill to requested pharmacy.

## 2022-10-16 ENCOUNTER — Ambulatory Visit (HOSPITAL_COMMUNITY): Payer: Medicare Other | Attending: Internal Medicine

## 2022-10-16 DIAGNOSIS — I4819 Other persistent atrial fibrillation: Secondary | ICD-10-CM | POA: Diagnosis not present

## 2022-10-16 DIAGNOSIS — Z950 Presence of cardiac pacemaker: Secondary | ICD-10-CM | POA: Diagnosis present

## 2022-10-16 DIAGNOSIS — I495 Sick sinus syndrome: Secondary | ICD-10-CM | POA: Diagnosis present

## 2022-10-16 LAB — ECHOCARDIOGRAM COMPLETE
AR max vel: 1.02 cm2
AV Area VTI: 1.01 cm2
AV Area mean vel: 0.99 cm2
AV Mean grad: 10 mmHg
AV Peak grad: 17.1 mmHg
Ao pk vel: 2.07 m/s
Area-P 1/2: 4.15 cm2
MV M vel: 5.68 m/s
MV Peak grad: 129 mmHg
Radius: 0.5 cm
S' Lateral: 2.8 cm

## 2022-10-17 ENCOUNTER — Telehealth: Payer: Self-pay | Admitting: Internal Medicine

## 2022-10-17 DIAGNOSIS — I272 Pulmonary hypertension, unspecified: Secondary | ICD-10-CM

## 2022-10-17 NOTE — Telephone Encounter (Signed)
Patient is calling in about her echo results. Please advise

## 2022-10-17 NOTE — Telephone Encounter (Signed)
Message received from [facility] to follow up with patient regarding recent Echo results.    Per Dr. Cristopher Peru: She has developed pulmonary HTN. Refer to Dr. Aundra Dubin.    Results of Echocardiogram complete.    Pt called and educated on what pulmonary HTN is, and that it was found in her Echo.  Pt referral submitted to Dr. Aundra Dubin per Dr. Tanna Furry order above.  Pt wants to review Echo results with Dr. Aundra Dubin when she sees him.  Pt requested an appointment within the next few weeks.

## 2022-10-23 ENCOUNTER — Inpatient Hospital Stay: Payer: Medicare Other | Attending: Oncology

## 2022-10-23 DIAGNOSIS — D472 Monoclonal gammopathy: Secondary | ICD-10-CM | POA: Insufficient documentation

## 2022-10-23 LAB — CBC WITH DIFFERENTIAL (CANCER CENTER ONLY)
Abs Immature Granulocytes: 0.01 10*3/uL (ref 0.00–0.07)
Basophils Absolute: 0 10*3/uL (ref 0.0–0.1)
Basophils Relative: 1 %
Eosinophils Absolute: 0.1 10*3/uL (ref 0.0–0.5)
Eosinophils Relative: 1 %
HCT: 42.5 % (ref 36.0–46.0)
Hemoglobin: 14.1 g/dL (ref 12.0–15.0)
Immature Granulocytes: 0 %
Lymphocytes Relative: 28 %
Lymphs Abs: 1.5 10*3/uL (ref 0.7–4.0)
MCH: 31.8 pg (ref 26.0–34.0)
MCHC: 33.2 g/dL (ref 30.0–36.0)
MCV: 95.7 fL (ref 80.0–100.0)
Monocytes Absolute: 0.4 10*3/uL (ref 0.1–1.0)
Monocytes Relative: 8 %
Neutro Abs: 3.4 10*3/uL (ref 1.7–7.7)
Neutrophils Relative %: 62 %
Platelet Count: 228 10*3/uL (ref 150–400)
RBC: 4.44 MIL/uL (ref 3.87–5.11)
RDW: 13.1 % (ref 11.5–15.5)
WBC Count: 5.5 10*3/uL (ref 4.0–10.5)
nRBC: 0 % (ref 0.0–0.2)

## 2022-10-23 LAB — BASIC METABOLIC PANEL - CANCER CENTER ONLY
Anion gap: 6 (ref 5–15)
BUN: 18 mg/dL (ref 8–23)
CO2: 32 mmol/L (ref 22–32)
Calcium: 11.4 mg/dL — ABNORMAL HIGH (ref 8.9–10.3)
Chloride: 98 mmol/L (ref 98–111)
Creatinine: <0.3 mg/dL — ABNORMAL LOW (ref 0.44–1.00)
Glucose, Bld: 87 mg/dL (ref 70–99)
Potassium: 4 mmol/L (ref 3.5–5.1)
Sodium: 136 mmol/L (ref 135–145)

## 2022-10-25 ENCOUNTER — Ambulatory Visit (HOSPITAL_COMMUNITY): Payer: Medicare Other

## 2022-10-25 ENCOUNTER — Encounter (HOSPITAL_COMMUNITY): Payer: Medicare Other

## 2022-10-26 LAB — PTH, INTACT AND CALCIUM
Calcium, Total (PTH): 10.4 mg/dL — ABNORMAL HIGH (ref 8.7–10.3)
PTH: 32 pg/mL (ref 15–65)

## 2022-10-28 ENCOUNTER — Other Ambulatory Visit: Payer: Self-pay | Admitting: Family Medicine

## 2022-10-29 ENCOUNTER — Encounter (HOSPITAL_COMMUNITY): Payer: Self-pay | Admitting: Emergency Medicine

## 2022-10-29 ENCOUNTER — Emergency Department (HOSPITAL_COMMUNITY)
Admission: EM | Admit: 2022-10-29 | Discharge: 2022-10-29 | Disposition: A | Discharge status: Home / Self Care | Payer: Medicare Other | Attending: Emergency Medicine | Admitting: Emergency Medicine

## 2022-10-29 ENCOUNTER — Other Ambulatory Visit: Payer: Self-pay

## 2022-10-29 ENCOUNTER — Emergency Department (HOSPITAL_COMMUNITY): Payer: Medicare Other

## 2022-10-29 DIAGNOSIS — J101 Influenza due to other identified influenza virus with other respiratory manifestations: Secondary | ICD-10-CM | POA: Diagnosis not present

## 2022-10-29 DIAGNOSIS — R5383 Other fatigue: Secondary | ICD-10-CM | POA: Diagnosis not present

## 2022-10-29 DIAGNOSIS — Z79899 Other long term (current) drug therapy: Secondary | ICD-10-CM | POA: Insufficient documentation

## 2022-10-29 DIAGNOSIS — I480 Paroxysmal atrial fibrillation: Secondary | ICD-10-CM | POA: Diagnosis not present

## 2022-10-29 DIAGNOSIS — R531 Weakness: Secondary | ICD-10-CM | POA: Diagnosis present

## 2022-10-29 DIAGNOSIS — Z7901 Long term (current) use of anticoagulants: Secondary | ICD-10-CM | POA: Diagnosis not present

## 2022-10-29 DIAGNOSIS — I10 Essential (primary) hypertension: Secondary | ICD-10-CM | POA: Insufficient documentation

## 2022-10-29 DIAGNOSIS — R0609 Other forms of dyspnea: Secondary | ICD-10-CM | POA: Insufficient documentation

## 2022-10-29 DIAGNOSIS — Z1152 Encounter for screening for COVID-19: Secondary | ICD-10-CM | POA: Diagnosis not present

## 2022-10-29 DIAGNOSIS — Z87891 Personal history of nicotine dependence: Secondary | ICD-10-CM | POA: Diagnosis not present

## 2022-10-29 DIAGNOSIS — E039 Hypothyroidism, unspecified: Secondary | ICD-10-CM | POA: Insufficient documentation

## 2022-10-29 DIAGNOSIS — E876 Hypokalemia: Secondary | ICD-10-CM | POA: Insufficient documentation

## 2022-10-29 LAB — CBC WITH DIFFERENTIAL/PLATELET
Abs Immature Granulocytes: 0.03 10*3/uL (ref 0.00–0.07)
Basophils Absolute: 0 10*3/uL (ref 0.0–0.1)
Basophils Relative: 0 %
Eosinophils Absolute: 0 10*3/uL (ref 0.0–0.5)
Eosinophils Relative: 0 %
HCT: 41.9 % (ref 36.0–46.0)
Hemoglobin: 13.9 g/dL (ref 12.0–15.0)
Immature Granulocytes: 1 %
Lymphocytes Relative: 19 %
Lymphs Abs: 1.2 10*3/uL (ref 0.7–4.0)
MCH: 32.2 pg (ref 26.0–34.0)
MCHC: 33.2 g/dL (ref 30.0–36.0)
MCV: 97 fL (ref 80.0–100.0)
Monocytes Absolute: 0.4 10*3/uL (ref 0.1–1.0)
Monocytes Relative: 7 %
Neutro Abs: 4.5 10*3/uL (ref 1.7–7.7)
Neutrophils Relative %: 73 %
Platelets: 179 10*3/uL (ref 150–400)
RBC: 4.32 MIL/uL (ref 3.87–5.11)
RDW: 13.2 % (ref 11.5–15.5)
WBC: 6.1 10*3/uL (ref 4.0–10.5)
nRBC: 0 % (ref 0.0–0.2)

## 2022-10-29 LAB — COMPREHENSIVE METABOLIC PANEL
ALT: 31 U/L (ref 0–44)
AST: 43 U/L — ABNORMAL HIGH (ref 15–41)
Albumin: 3.5 g/dL (ref 3.5–5.0)
Alkaline Phosphatase: 47 U/L (ref 38–126)
Anion gap: 10 (ref 5–15)
BUN: 20 mg/dL (ref 8–23)
CO2: 27 mmol/L (ref 22–32)
Calcium: 9.4 mg/dL (ref 8.9–10.3)
Chloride: 96 mmol/L — ABNORMAL LOW (ref 98–111)
Creatinine, Ser: 0.38 mg/dL — ABNORMAL LOW (ref 0.44–1.00)
GFR, Estimated: >60 mL/min (ref >60)
Glucose, Bld: 84 mg/dL (ref 70–99)
Potassium: 3 mmol/L — ABNORMAL LOW (ref 3.5–5.1)
Sodium: 133 mmol/L — ABNORMAL LOW (ref 135–145)
Total Bilirubin: 0.9 mg/dL (ref 0.3–1.2)
Total Protein: 8 g/dL (ref 6.5–8.1)

## 2022-10-29 LAB — PROTEIN ELECTROPHORESIS, SERUM
A/G Ratio: 0.8 (ref 0.7–1.7)
Albumin ELP: 3.3 g/dL (ref 2.9–4.4)
Alpha-1-Globulin: 0.3 g/dL (ref 0.0–0.4)
Alpha-2-Globulin: 0.7 g/dL (ref 0.4–1.0)
Beta Globulin: 1.7 g/dL — ABNORMAL HIGH (ref 0.7–1.3)
Gamma Globulin: 1.6 g/dL (ref 0.4–1.8)
Globulin, Total: 4.4 g/dL — ABNORMAL HIGH (ref 2.2–3.9)
M-Spike, %: 0.5 g/dL — ABNORMAL HIGH
Total Protein ELP: 7.7 g/dL (ref 6.0–8.5)

## 2022-10-29 LAB — RESP PANEL BY RT-PCR (RSV, FLU A&B, COVID)  RVPGX2
Influenza A by PCR: POSITIVE — AB
Influenza B by PCR: NEGATIVE
Resp Syncytial Virus by PCR: NEGATIVE
SARS Coronavirus 2 by RT PCR: NEGATIVE

## 2022-10-29 LAB — T4, FREE: Free T4: 1.41 ng/dL — ABNORMAL HIGH (ref 0.61–1.12)

## 2022-10-29 LAB — BRAIN NATRIURETIC PEPTIDE: B Natriuretic Peptide: 383.3 pg/mL — ABNORMAL HIGH (ref 0.0–100.0)

## 2022-10-29 LAB — TSH: TSH: 0.501 u[IU]/mL (ref 0.350–4.500)

## 2022-10-29 LAB — CK: Total CK: 121 U/L (ref 38–234)

## 2022-10-29 MED ORDER — IPRATROPIUM-ALBUTEROL 0.5-2.5 (3) MG/3ML IN SOLN
3.0000 mL | Freq: Once | RESPIRATORY_TRACT | Status: AC
  Administered 2022-10-29: 3 mL via RESPIRATORY_TRACT
  Filled 2022-10-29: qty 3

## 2022-10-29 MED ORDER — ACETAMINOPHEN 325 MG PO TABS: 650.0000 mg | ORAL_TABLET | Freq: Four times a day (QID) | ORAL | 0 refills | Status: DC | PRN

## 2022-10-29 MED ORDER — OSELTAMIVIR PHOSPHATE 30 MG PO CAPS: 30.0000 mg | ORAL_CAPSULE | Freq: Two times a day (BID) | ORAL | 0 refills | Status: DC

## 2022-10-29 MED ORDER — SODIUM CHLORIDE 0.9 % IV BOLUS
500.0000 mL | Freq: Once | INTRAVENOUS | Status: AC
  Administered 2022-10-29: 500 mL via INTRAVENOUS

## 2022-10-29 MED ORDER — IOHEXOL 300 MG/ML  SOLN: 100.0000 mL | Freq: Once | INTRAMUSCULAR | Status: DC | PRN

## 2022-10-29 MED ORDER — GUAIFENESIN-DM 100-10 MG/5ML PO SYRP: 5.0000 mL | ORAL_SOLUTION | ORAL | 0 refills | Status: DC | PRN

## 2022-10-29 MED ORDER — POTASSIUM CHLORIDE CRYS ER 20 MEQ PO TBCR
40.0000 meq | EXTENDED_RELEASE_TABLET | Freq: Once | ORAL | Status: AC
  Administered 2022-10-29: 40 meq via ORAL
  Filled 2022-10-29: qty 2

## 2022-10-29 MED ORDER — SODIUM CHLORIDE 0.9 % IV SOLN
1.0000 g | Freq: Once | INTRAVENOUS | Status: AC
  Administered 2022-10-29: 1 g via INTRAVENOUS
  Filled 2022-10-29: qty 10

## 2022-10-29 MED ORDER — ONDANSETRON HCL 4 MG PO TABS: 4.0000 mg | ORAL_TABLET | Freq: Three times a day (TID) | ORAL | 0 refills | Status: DC | PRN

## 2022-10-29 MED ORDER — ONDANSETRON HCL 4 MG/2ML IJ SOLN
4.0000 mg | Freq: Once | INTRAMUSCULAR | Status: AC
  Administered 2022-10-29: 4 mg via INTRAVENOUS
  Filled 2022-10-29: qty 2

## 2022-10-29 NOTE — ED Provider Notes (Signed)
[facility] Provider Note  CSN: [id number] Arrival date & time: 10/29/22 [id number]  Chief Complaint(s) Weakness  HPI Deanna Rivera is a 89 y.o. female with past medical history as below, significant for hypertension, PAF, pulmonary hypertension, thyroid disease, 6 sinus syndrome who presents to the ED with complaint of weakness, cough, fatigue, fall this morning, nausea, vomiting abdominal pain.  Positive sick contact with spouse who was diagnosed with the flu, patient began having symptoms shortly after spouse.  3 days of symptoms so far.  Gradually worsening, poor p.o. intake last 3 days, nausea and vomiting, generalized abdominal pain, productive cough with yellow/green phlegm intermittently.  Exertional dyspnea.  Did not take her home medications last 24 hours secondary to nausea.  Body aches, malaise.  Chills no fevers.  No change in urination.  Slipped out of bed this morning and fell to the ground, unable to get up due to generalized weakness.  Unsure if she hit her head. -LOC  Past Medical History Past Medical History:  Diagnosis Date   Cataract    Hypertension    Moderate aortic stenosis    Moderate mitral regurgitation    Persistent atrial fibrillation (HCC)    PMR (polymyalgia rheumatica) (HCC)    Second degree AV block    Sick sinus syndrome (HCC)    Thyroid disease    Patient Active Problem List   Diagnosis Date Noted   Degenerative lumbar spinal stenosis 05/30/2022   Encounter to establish care 01/14/2022   Sensorineural hearing loss (SNHL) of both ears 01/14/2022   Bilateral impacted cerumen 01/14/2022   Hashimoto's thyroiditis 06/15/2021   History of vitamin D deficiency 06/15/2021   Frequent falls 05/17/2021   Age-related osteoporosis with current pathological fracture 02/06/2021   Compression fracture of body of thoracic vertebra ([place]) 02/06/2021   Primary osteoarthritis of right knee 10/23/2019   Neuralgia, postherpetic  07/02/2019   OAB (overactive bladder) 07/02/2019   SSS (sick sinus syndrome) ([place]) 02/11/2019   Constipation 01/18/2019   Status post placement of cardiac pacemaker 07/08/2018   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   Shellfish allergy 11/22/2015   Nonrheumatic mitral valve regurgitation 09/16/2015   Nontoxic multinodular goiter 09/08/2015   Current use of long term anticoagulation 05/16/2015   Recurrent UTI 03/14/2015   (HFpEF) heart failure with preserved ejection fraction ([place]) 09/13/2014   Pacemaker 11/09/2011   Impaired fasting glucose 05/23/2009   Synovial cyst of popliteal space 11/22/2008   Long term current use of systemic steroids 03/30/2008   Polymyalgia rheumatica ([place]) 03/30/2008   Benign essential hypertension 05/05/2007   Persistent atrial fibrillation ([place]) 11/06/2006   Pure hypercholesterolemia 11/06/2006   Hypothyroidism due to Hashimoto's thyroiditis, Rx Levothyroxine 06/25/2005   Home Medication(s) Prior to Admission medications   Medication Sig Start Date End Date Taking? Authorizing Provider  acetaminophen (TYLENOL) 500 MG tablet Take 500 mg by mouth every 6 (six) hours as needed for mild pain or headache.    [provider]  apixaban (ELIQUIS) 2.5 MG TABS tablet TAKE 1 TABLET BY MOUTH TWICE  DAILY 10/15/22   Evans Lance, MD  Cholecalciferol (VITAMIN D3) 25 MCG (1000 UT) CAPS Take 1 capsule by mouth daily.    [provider]  digoxin (LANOXIN) 0.125 MG tablet TAKE 1 TABLET BY MOUTH DAILY 07/09/22   Evans Lance, MD  dorzolamide-timolol (COSOPT) 22.3-6.8 MG/ML ophthalmic solution Place 1 drop into both eyes 2 (two) times daily. 03/30/21   [provider]  hydrochlorothiazide (HYDRODIURIL)  25 MG tablet TAKE 1 TABLET BY MOUTH DAILY 10/29/22   [name], MD  latanoprost (XALATAN) 0.005 % ophthalmic solution Place 1 drop into both eyes at bedtime. 03/13/21   [provider]  levothyroxine (SYNTHROID) 112 MCG tablet TAKE 1 TABLET  BY MOUTH DAILY  BEFORE BREAKFAST 08/08/22   [name], MD  metoprolol succinate (TOPROL-XL) 50 MG 24 hr tablet TAKE 1 TABLET BY MOUTH DAILY 06/07/22   [name], MD  predniSONE (DELTASONE) 1 MG tablet TAKE 1 TABLET BY MOUTH DAILY 08/08/22   [name], MD                                                                                                                                    Past Surgical History Past Surgical History:  Procedure Laterality Date   ABDOMINAL HYSTERECTOMY     APPENDECTOMY     CESAREAN SECTION     x 3   CHOLECYSTECTOMY     PACEMAKER GENERATOR CHANGE  11/20/2018   [employer] Accolade MRI PPM implanted in [place] CYST EXCISION     Family History Family History  Problem Relation Age of Onset   Early death Mother 17   Hypertension Brother    Cirrhosis Father 22   Lung cancer Brother 3   Scleroderma Daughter     Social History Social History   Tobacco Use   Smoking status: Former    Types: Cigarettes   Smokeless tobacco: Never  Substance Use Topics   Alcohol use: Not Currently   Drug use: Not Currently   Allergies Shellfish-derived products, Epinephrine, Monosodium glutamate, Ciprofloxacin, Erythromycin, Monosodium glutamate, Sulfa antibiotics, Sulfamethoxazole-trimethoprim, and Tetracyclines & related  Review of Systems Review of Systems  Constitutional:  Positive for appetite change and chills. Negative for activity change and fever.  HENT:  Positive for congestion. Negative for facial swelling and trouble swallowing.   Eyes:  Negative for discharge and redness.  Respiratory:  Positive for cough and shortness of breath.   Cardiovascular:  Negative for chest pain and palpitations.  Gastrointestinal:  Positive for abdominal pain, nausea and vomiting.  Genitourinary:  Negative for dysuria and flank pain.  Musculoskeletal:  Positive for arthralgias and myalgias. Negative for back pain and  gait problem.  Skin:  Negative for pallor and rash.  Neurological:  Negative for syncope and headaches.    Physical Exam Vital Signs  I have reviewed the triage vital signs BP 134/63   Pulse 67   Temp 98.3 F (36.8 C) (Oral)   Resp (!) 25   Ht 5\' 4"  (1.626 m)   Wt 54.4 kg   SpO2 96%   BMI 20.60 kg/m  Physical Exam Vitals and nursing note reviewed.  Constitutional:      General: She is not in acute distress.    Appearance: Normal appearance.  HENT:  Head: Normocephalic and atraumatic. No raccoon eyes, Battle's sign, right periorbital erythema or left periorbital erythema.     Jaw: There is normal jaw occlusion.     Right Ear: External ear normal.     Left Ear: External ear normal.     Nose: Nose normal.     Mouth/Throat:     Mouth: Mucous membranes are moist.  Eyes:     General: No scleral icterus.       Right eye: No discharge.        Left eye: No discharge.  Cardiovascular:     Rate and Rhythm: Normal rate and regular rhythm.     Pulses: Normal pulses.     Heart sounds: Normal heart sounds.  Pulmonary:     Effort: Pulmonary effort is normal. No tachypnea, accessory muscle usage or respiratory distress.     Breath sounds: Normal breath sounds.     Comments: Coarse breath sounds b/l Abdominal:     General: Abdomen is flat.     Palpations: Abdomen is soft.     Tenderness: There is no abdominal tenderness. There is no guarding or rebound.  Musculoskeletal:        General: Normal range of motion.     Cervical back: No rigidity.     Right lower leg: No edema.     Left lower leg: No edema.  Skin:    General: Skin is warm and dry.     Capillary Refill: Capillary refill takes less than 2 seconds.  Neurological:     Mental Status: She is alert and oriented to person, place, and time.     GCS: GCS eye subscore is 4. GCS verbal subscore is 5. GCS motor subscore is 6.     Cranial Nerves: Cranial nerves 2-12 are intact.     Sensory: Sensation is intact.     Motor:  Motor function is intact.  Psychiatric:        Mood and Affect: Mood normal.        Behavior: Behavior normal.     ED Results and Treatments Labs (all labs ordered are listed, but only abnormal results are displayed) Labs Reviewed  RESP PANEL BY RT-PCR (RSV, FLU A&B, COVID)  RVPGX2 - Abnormal; Notable for the following components:      Result Value   Influenza A by PCR POSITIVE (*)    All other components within normal limits  BRAIN NATRIURETIC PEPTIDE - Abnormal; Notable for the following components:   B Natriuretic Peptide 383.3 (*)    All other components within normal limits  COMPREHENSIVE METABOLIC PANEL - Abnormal; Notable for the following components:   Sodium 133 (*)    Potassium 3.0 (*)    Chloride 96 (*)    Creatinine, Ser 0.38 (*)    AST 43 (*)    All other components within normal limits  CBC WITH DIFFERENTIAL/PLATELET  CK  TSH  T4, FREE  T3, FREE  Radiology DG Chest Port 1 View  Result Date: 10/29/2022 CLINICAL DATA:  Cough, difficulty breathing, weakness for 3 days, slid out of bed and could not get up, productive cough EXAM: PORTABLE CHEST 1 VIEW COMPARISON:  Portable exam 0949 hours compared to 05/11/2022 FINDINGS: LEFT subclavian sequential transvenous pacemaker leads project at RIGHT atrium and RIGHT ventricle. Enlargement of cardiac silhouette. Mediastinal contours and pulmonary vascularity normal. Mild chronic RIGHT basilar atelectasis and slight accentuation of LEFT basilar markings. Lungs otherwise clear. No pulmonary infiltrate, pleural effusion, or pneumothorax. Bones demineralized. IMPRESSION: Enlargement of cardiac silhouette post pacemaker. Chronic RIGHT basilar atelectasis. Electronically Signed   By: Lavonia Dana M.D.   On: 10/29/2022 10:44    Pertinent labs & imaging results that were available during my care of the patient were  reviewed by me and considered in my medical decision making (see MDM for details).  Medications Ordered in ED Medications  iohexol (OMNIPAQUE) 300 MG/ML solution 100 mL (has no administration in time range)  cefTRIAXone (ROCEPHIN) 1 g in sodium chloride 0.9 % 100 mL IVPB (0 g Intravenous Stopped 10/29/22 1201)  sodium chloride 0.9 % bolus 500 mL (500 mLs Intravenous New Bag/Given 10/29/22 1041)  ondansetron (ZOFRAN) injection 4 mg (4 mg Intravenous Given 10/29/22 1040)  ipratropium-albuterol (DUONEB) 0.5-2.5 (3) MG/3ML nebulizer solution 3 mL (3 mLs Nebulization Given 10/29/22 1035)  potassium chloride SA (KLOR-CON M) CR tablet 40 mEq (40 mEq Oral Given 10/29/22 1200)                                                                                                                                     Procedures Procedures  (including critical care time)  Medical Decision Making / ED Course    Medical Decision Making:    Deanna Rivera is a 89 y.o. female with past medical history as below, significant for hypertension, PAF, pulmonary hypertension, thyroid disease, 6 sinus syndrome who presents to the ED with complaint of weakness, cough, fatigue, fall this morning, nausea, vomiting abdominal pain.. The complaint involves an extensive differential diagnosis and also carries with it a high risk of complications and morbidity.  Serious etiology was considered. Ddx includes but is not limited to: In my evaluation of this patient's dyspnea my DDx includes, but is not limited to, pneumonia, pulmonary embolism, pneumothorax, pulmonary edema, metabolic acidosis, asthma, COPD, cardiac cause, anemia, anxiety, etc.  Differential diagnosis includes but is not exclusive to acute cholecystitis, intrathoracic causes for epigastric abdominal pain, gastritis, duodenitis, pancreatitis, small bowel or large bowel obstruction, abdominal aortic aneurysm, hernia, gastritis, etc.   Complete initial physical exam  performed, notably the patient  was NAD, HDS, coughing during exam.    Reviewed and confirmed nursing documentation for past medical history, family history, social history.  Vital signs reviewed.    Clinical Course as of 10/29/22 1408  Mon Oct 29, 2022  1130 Influenza A By PCR(!): POSITIVE Symptom onset  around 3 days ago, spouse +tive as well [SG]  1130 Potassium(!): 3.0 Replaced orally [SG]  1245 Patient denies any abdominal pain or head injury at this time.  Refusing CT imaging [SG]  [id number] Pt feeling better, not hypoxic. Ongoing cough. Tolerating PO. She has FLU A.  [SG]    Clinical Course User Index [SG] Jeanell Sparrow, DO   Patient with flu A, husband was previously diagnosed with flu.  Patient is wheelchair-bound.  Spouse and son unable to care for her at home.  No hypoxia.  Tolerating p.o. without difficulty.  Recommend supportive care at home, follow-up with PCP.  The patient improved significantly and was discharged in stable condition. Detailed discussions were had with the patient regarding current findings, and need for close f/u with PCP or on call doctor. The patient has been instructed to return immediately if the symptoms worsen in any way for re-evaluation. Patient verbalized understanding and is in agreement with current care plan. All questions answered prior to discharge.    Additional history obtained: -Additional history obtained from na -External records from outside source obtained and reviewed including: Chart review including previous notes, labs, imaging, consultation notes including primary care documentation, prior echo, prior labs and imaging, home medications.   Lab Tests: -I ordered, reviewed, and interpreted labs.   The pertinent results include:   Labs Reviewed  RESP PANEL BY RT-PCR (RSV, FLU A&B, COVID)  RVPGX2 - Abnormal; Notable for the following components:      Result Value   Influenza A by PCR POSITIVE (*)    All other components within normal  limits  BRAIN NATRIURETIC PEPTIDE - Abnormal; Notable for the following components:   B Natriuretic Peptide 383.3 (*)    All other components within normal limits  COMPREHENSIVE METABOLIC PANEL - Abnormal; Notable for the following components:   Sodium 133 (*)    Potassium 3.0 (*)    Chloride 96 (*)    Creatinine, Ser 0.38 (*)    AST 43 (*)    All other components within normal limits  CBC WITH DIFFERENTIAL/PLATELET  CK  TSH  T4, FREE  T3, FREE    Notable for flu A, BNP mild elev, k low  EKG   EKG Interpretation  Date/Time:  Monday October 29 2022 10:51:32 EDT Ventricular Rate:  72 PR Interval:    QRS Duration: 103 QT Interval:  403 QTC Calculation: 468 R Axis:   87 Text Interpretation: Atrial fibrillation Anteroseptal infarct, age indeterminate paced Confirmed by Wynona Dove ([id number]) on 10/29/2022 2:07:42 PM         Imaging Studies ordered: I ordered imaging studies including cxr I independently visualized the following imaging with scope of interpretation limited to determining acute life threatening conditions related to emergency care: chronic changes, no pna I independently visualized and interpreted imaging. I agree with the radiologist interpretation   Medicines ordered and prescription drug management: Meds ordered this encounter  Medications   cefTRIAXone (ROCEPHIN) 1 g in sodium chloride 0.9 % 100 mL IVPB    Order Specific Question:   Antibiotic Indication:    Answer:   CAP   sodium chloride 0.9 % bolus 500 mL   ondansetron (ZOFRAN) injection 4 mg   ipratropium-albuterol (DUONEB) 0.5-2.5 (3) MG/3ML nebulizer solution 3 mL   iohexol (OMNIPAQUE) 300 MG/ML solution 100 mL   potassium chloride SA (KLOR-CON M) CR tablet 40 mEq    -I have reviewed the patients home medicines and have made adjustments as needed  Consultations Obtained: na   Cardiac Monitoring: The patient was maintained on a cardiac monitor.  I personally viewed and interpreted the  cardiac monitored which showed an underlying rhythm of: paced   Social Determinants of Health:  Diagnosis or treatment significantly limited by social determinants of health: former smoker   Reevaluation: After the interventions noted above, I reevaluated the patient and found that they have improved  Co morbidities that complicate the patient evaluation  Past Medical History:  Diagnosis Date   Cataract    Hypertension    Moderate aortic stenosis    Moderate mitral regurgitation    Persistent atrial fibrillation (HCC)    PMR (polymyalgia rheumatica) (HCC)    Second degree AV block    Sick sinus syndrome ([place])    Thyroid disease       Dispostion: Disposition decision including need for hospitalization was considered, and patient discharged from emergency department.    Final Clinical Impression(s) / ED Diagnoses Final diagnoses:  None     This chart was dictated using voice recognition software.  Despite best efforts to proofread,  errors can occur which can change the documentation meaning.    Wynona Dove A, DO 10/29/22 1408

## 2022-10-29 NOTE — Discharge Instructions (Signed)
It was a pleasure caring for you today in the emergency department.  Please return to the emergency department for any worsening or worrisome symptoms.  Recommend drinking electrolyte beverage such as Gatorade or Pedialyte.  Eat small meals throughout the day even if your appetite is poor.

## 2022-10-29 NOTE — ED Triage Notes (Signed)
Pt BIBA from [facility] with c/o of weakness x3days. Today she slid out of bed and could not get up. Pt states she may have the flu. Productive cough. Denies N/V/D.   T 99.2 BP 172/90 HR 70 97% RA

## 2022-10-29 NOTE — ED Notes (Signed)
Pt awaiting PTAR.  

## 2022-10-30 ENCOUNTER — Other Ambulatory Visit (HOSPITAL_BASED_OUTPATIENT_CLINIC_OR_DEPARTMENT_OTHER): Payer: Self-pay

## 2022-10-30 ENCOUNTER — Other Ambulatory Visit: Payer: Self-pay

## 2022-10-30 DIAGNOSIS — D472 Monoclonal gammopathy: Secondary | ICD-10-CM

## 2022-10-30 NOTE — Addendum Note (Signed)
Addended by: Leanora Cover B on: 10/30/2022 12:49 PM   Modules accepted: Orders

## 2022-10-31 LAB — T3, FREE: T3, Free: 0.9 pg/mL — ABNORMAL LOW (ref 2.0–4.4)

## 2022-11-02 ENCOUNTER — Encounter: Payer: Self-pay | Admitting: Family Medicine

## 2022-11-02 ENCOUNTER — Ambulatory Visit (INDEPENDENT_AMBULATORY_CARE_PROVIDER_SITE_OTHER): Payer: Medicare Other | Admitting: Family Medicine

## 2022-11-02 VITALS — BP 122/70 | HR 81 | Temp 98.4°F | Ht 64.0 in

## 2022-11-02 DIAGNOSIS — J40 Bronchitis, not specified as acute or chronic: Secondary | ICD-10-CM

## 2022-11-02 DIAGNOSIS — K5901 Slow transit constipation: Secondary | ICD-10-CM

## 2022-11-02 MED ORDER — POTASSIUM CHLORIDE ER 10 MEQ PO TBCR: 10.0000 meq | EXTENDED_RELEASE_TABLET | Freq: Every day | ORAL | 0 refills | Status: DC

## 2022-11-02 MED ORDER — CEFDINIR 300 MG PO CAPS: 300.0000 mg | ORAL_CAPSULE | Freq: Two times a day (BID) | ORAL | 0 refills | Status: DC

## 2022-11-02 MED ORDER — ALBUTEROL SULFATE HFA 108 (90 BASE) MCG/ACT IN AERS: 2.0000 | INHALATION_SPRAY | Freq: Four times a day (QID) | RESPIRATORY_TRACT | 2 refills | Status: DC | PRN

## 2022-11-02 NOTE — Patient Instructions (Signed)
Potassium 1/day for 3 days,   take 5 days if some diarrhea   Omnicef antibiotics Inhaler eery 6 hrs as needed if coughing a lot, tight in chest, or trouble breathing   Colace 100mg  daily Miralax  2 capfulls daily  Worse in any way, abdominal pain, vomiting-go to the ER

## 2022-11-02 NOTE — Progress Notes (Unsigned)
Subjective:     Patient ID: Deanna Rivera, female    DOB: Sep 14, 1932, 89 y.o.   MRN: [id number]  Chief Complaint  Patient presents with   Cough    Ongoing severe cough, flu one week ago   Constipation    Haven't had a bowel movement in 7 days     HPI-here w/husb  Had URI/weak/fell so went to ED.  Dx w/flu 3/18.  Still a lot of cough.  Seen in ED 3/18.  K was low and got in ER but not on it.  Cough not worse but bad.  Harder to breathe.  No fever. Did get 1 dose of rocephin in ER 2.  Constipation-no bm for 1 wk.  No abd pain.  Normally goes every 2-3 days.  No vomiting  Health Maintenance Due  Topic Date Due   Medicare Annual Wellness (AWV)  11/15/2022    Past Medical History:  Diagnosis Date   Cataract    Hypertension    Moderate aortic stenosis    Moderate mitral regurgitation    Persistent atrial fibrillation (HCC)    PMR (polymyalgia rheumatica) (HCC)    Second degree AV block    Sick sinus syndrome ([place])    Thyroid disease     Past Surgical History:  Procedure Laterality Date   ABDOMINAL HYSTERECTOMY     APPENDECTOMY     CESAREAN SECTION     x 3   CHOLECYSTECTOMY     PACEMAKER GENERATOR CHANGE  11/20/2018   [employer] Accolade MRI PPM implanted in [place]      Outpatient Medications Prior to Visit  Medication Sig Dispense Refill   acetaminophen (TYLENOL) 325 MG tablet Take 2 tablets (650 mg total) by mouth every 6 (six) hours as needed. 36 tablet 0   acetaminophen (TYLENOL) 500 MG tablet Take 500 mg by mouth every 6 (six) hours as needed for mild pain or headache.     apixaban (ELIQUIS) 2.5 MG TABS tablet TAKE 1 TABLET BY MOUTH TWICE  DAILY 180 tablet 2   Cholecalciferol (VITAMIN D3) 25 MCG (1000 UT) CAPS Take 1 capsule by mouth daily.     digoxin (LANOXIN) 0.125 MG tablet TAKE 1 TABLET BY MOUTH DAILY 90 tablet 3   dorzolamide-timolol (COSOPT) 22.3-6.8 MG/ML ophthalmic solution Place 1 drop into  both eyes 2 (two) times daily.     hydrochlorothiazide (HYDRODIURIL) 25 MG tablet TAKE 1 TABLET BY MOUTH DAILY 90 tablet 3   latanoprost (XALATAN) 0.005 % ophthalmic solution Place 1 drop into both eyes at bedtime.     levothyroxine (SYNTHROID) 112 MCG tablet TAKE 1 TABLET BY MOUTH DAILY  BEFORE BREAKFAST 90 tablet 2   metoprolol succinate (TOPROL-XL) 50 MG 24 hr tablet TAKE 1 TABLET BY MOUTH DAILY 90 tablet 3   ondansetron (ZOFRAN) 4 MG tablet Take 1 tablet (4 mg total) by mouth every 8 (eight) hours as needed for nausea or vomiting. 4 tablet 0   predniSONE (DELTASONE) 1 MG tablet TAKE 1 TABLET BY MOUTH DAILY 90 tablet 3   guaiFENesin-dextromethorphan (ROBITUSSIN DM) 100-10 MG/5ML syrup Take 5 mLs by mouth every 4 (four) hours as needed for cough. (Patient not taking: Reported on 11/02/2022) 118 mL 0   oseltamivir (TAMIFLU) 30 MG capsule Take 1 capsule (30 mg total) by mouth 2 (two) times daily for 7 days. (Patient not taking: Reported on 11/02/2022) 14 capsule 0   No facility-administered medications prior  to visit.    Allergies  Allergen Reactions   Shellfish-Derived Products Shortness Of Breath and Other (See Comments)    Lips go numb   Epinephrine Other (See Comments) and Hypertension    "Makes me feel wired"   Monosodium Glutamate Other (See Comments)    other   Monosodium Glutamate Hypertension and Other (See Comments)    "Makes me feel badly"  Other Reaction(s): Other (See Comments)  other, other   Ciprofloxacin Other (See Comments)    Allergic reaction not recalled  Other Reaction(s): Other (See Comments)  Other reaction(s): Intolerance   Dronedarone     Other Reaction(s): Other (See Comments)  Leg swelling, Leg swelling   Erythromycin Other (See Comments)    Allergic reaction not recalled   Sulfa Antibiotics Other (See Comments)    Allergic reaction not recalled   Sulfamethoxazole-Trimethoprim    Tetracyclines & Related Other (See Comments)    Allergic reaction not  recalled   ROS neg/noncontributory except as noted HPI/below      Objective:     BP 122/70   Pulse 81   Temp 98.4 F (36.9 C) (Temporal)   Ht 5\' 4"  (1.626 m)   SpO2 96%   BMI 20.60 kg/m  Wt Readings from Last 3 Encounters:  10/29/22 120 lb (54.4 kg)  10/09/22 127 lb (57.6 kg)  09/27/22 127 lb (57.6 kg)    Physical Exam   Gen: WDWN NAD elderly wf. Nontoxic but congested cough HEENT: NCAT, conjunctiva not injected, sclera nonicteric NECK:  supple, no thyromegaly, no nodes, no carotid bruits CARDIAC: RRR, S1S2+, no murmur.  LUNGS: CTAB. No wheezes but coarse BS ABDOMEN:  BS+, soft, NTND, No HSM, no masses EXT:  no edema MSK: in w/c  NEURO: A&O x3.  CN II-XII intact.  PSYCH: normal mood. Good eye contact  Reviewed ER notes     Assessment & Plan:   Problem List Items Addressed This Visit       Other   Constipation   Other Visit Diagnoses     Bronchitis    -  Primary     1.  Bronchitis-she was positive for influenza in the emergency room.  However, given age and comorbidities, I am concerned about potentially secondary bacterial infection.  Will start Omnicef 300 mg twice daily.  Albuterol inhaler (demoed use) every 6 hours as needed.  Worse, no changes, emergency room 2.  Constipation-some chronic problems, however worsening.  Will do MiraLAX 2 capfuls daily, Colace 100 mg daily.  If increased nausea, vomiting, abdominal pain-needs to go to the emergency room. 3.  Hypokalemia-she did get some replacement in the ER.  I am concerned that over the weekend if she gets diarrhea from trying to undo the constipation, this may worsen.  Will do potassium 10 mEq daily for 3 days.  If she gets diarrhea, extend that to 5 days.  No orders of the defined types were placed in this encounter.   Wellington Hampshire, MD

## 2022-11-03 ENCOUNTER — Emergency Department (HOSPITAL_COMMUNITY): Payer: Medicare Other

## 2022-11-03 ENCOUNTER — Inpatient Hospital Stay (HOSPITAL_COMMUNITY)
Admission: EM | Admit: 2022-11-03 | Discharge: 2022-11-07 | DRG: 545 | Disposition: A | Discharge status: Skilled Nursing Facility | Payer: Medicare Other | Attending: Family Medicine | Admitting: Family Medicine

## 2022-11-03 DIAGNOSIS — M353 Polymyalgia rheumatica: Secondary | ICD-10-CM | POA: Diagnosis present

## 2022-11-03 DIAGNOSIS — R5381 Other malaise: Secondary | ICD-10-CM | POA: Diagnosis present

## 2022-11-03 DIAGNOSIS — Z79899 Other long term (current) drug therapy: Secondary | ICD-10-CM

## 2022-11-03 DIAGNOSIS — I4819 Other persistent atrial fibrillation: Secondary | ICD-10-CM | POA: Diagnosis present

## 2022-11-03 DIAGNOSIS — E871 Hypo-osmolality and hyponatremia: Secondary | ICD-10-CM | POA: Diagnosis present

## 2022-11-03 DIAGNOSIS — G7241 Inclusion body myositis [IBM]: Secondary | ICD-10-CM | POA: Diagnosis not present

## 2022-11-03 DIAGNOSIS — Z6820 Body mass index (BMI) 20.0-20.9, adult: Secondary | ICD-10-CM

## 2022-11-03 DIAGNOSIS — R531 Weakness: Secondary | ICD-10-CM | POA: Diagnosis not present

## 2022-11-03 DIAGNOSIS — Z7952 Long term (current) use of systemic steroids: Secondary | ICD-10-CM

## 2022-11-03 DIAGNOSIS — I10 Essential (primary) hypertension: Secondary | ICD-10-CM | POA: Diagnosis present

## 2022-11-03 DIAGNOSIS — Z950 Presence of cardiac pacemaker: Secondary | ICD-10-CM

## 2022-11-03 DIAGNOSIS — Z888 Allergy status to other drugs, medicaments and biological substances status: Secondary | ICD-10-CM

## 2022-11-03 DIAGNOSIS — J9811 Atelectasis: Secondary | ICD-10-CM | POA: Diagnosis present

## 2022-11-03 DIAGNOSIS — Z7901 Long term (current) use of anticoagulants: Secondary | ICD-10-CM

## 2022-11-03 DIAGNOSIS — E039 Hypothyroidism, unspecified: Secondary | ICD-10-CM | POA: Diagnosis present

## 2022-11-03 DIAGNOSIS — R1313 Dysphagia, pharyngeal phase: Secondary | ICD-10-CM | POA: Diagnosis present

## 2022-11-03 DIAGNOSIS — Z87891 Personal history of nicotine dependence: Secondary | ICD-10-CM

## 2022-11-03 DIAGNOSIS — I5030 Unspecified diastolic (congestive) heart failure: Secondary | ICD-10-CM | POA: Diagnosis present

## 2022-11-03 DIAGNOSIS — I080 Rheumatic disorders of both mitral and aortic valves: Secondary | ICD-10-CM | POA: Diagnosis present

## 2022-11-03 DIAGNOSIS — K5900 Constipation, unspecified: Secondary | ICD-10-CM | POA: Diagnosis present

## 2022-11-03 DIAGNOSIS — Z801 Family history of malignant neoplasm of trachea, bronchus and lung: Secondary | ICD-10-CM

## 2022-11-03 DIAGNOSIS — H409 Unspecified glaucoma: Secondary | ICD-10-CM | POA: Diagnosis present

## 2022-11-03 DIAGNOSIS — Z91013 Allergy to seafood: Secondary | ICD-10-CM

## 2022-11-03 DIAGNOSIS — Z79890 Hormone replacement therapy (postmenopausal): Secondary | ICD-10-CM

## 2022-11-03 DIAGNOSIS — J690 Pneumonitis due to inhalation of food and vomit: Secondary | ICD-10-CM | POA: Diagnosis present

## 2022-11-03 DIAGNOSIS — I5032 Chronic diastolic (congestive) heart failure: Secondary | ICD-10-CM | POA: Diagnosis present

## 2022-11-03 DIAGNOSIS — E43 Unspecified severe protein-calorie malnutrition: Secondary | ICD-10-CM | POA: Insufficient documentation

## 2022-11-03 DIAGNOSIS — I441 Atrioventricular block, second degree: Secondary | ICD-10-CM | POA: Diagnosis present

## 2022-11-03 DIAGNOSIS — I4821 Permanent atrial fibrillation: Secondary | ICD-10-CM | POA: Diagnosis present

## 2022-11-03 DIAGNOSIS — I110 Hypertensive heart disease with heart failure: Secondary | ICD-10-CM | POA: Diagnosis present

## 2022-11-03 DIAGNOSIS — E861 Hypovolemia: Secondary | ICD-10-CM | POA: Diagnosis present

## 2022-11-03 DIAGNOSIS — I495 Sick sinus syndrome: Secondary | ICD-10-CM | POA: Diagnosis present

## 2022-11-03 DIAGNOSIS — Z8249 Family history of ischemic heart disease and other diseases of the circulatory system: Secondary | ICD-10-CM

## 2022-11-03 DIAGNOSIS — J189 Pneumonia, unspecified organism: Secondary | ICD-10-CM

## 2022-11-03 DIAGNOSIS — Z881 Allergy status to other antibiotic agents status: Secondary | ICD-10-CM

## 2022-11-03 DIAGNOSIS — E876 Hypokalemia: Secondary | ICD-10-CM | POA: Diagnosis present

## 2022-11-03 DIAGNOSIS — E8809 Other disorders of plasma-protein metabolism, not elsewhere classified: Secondary | ICD-10-CM | POA: Diagnosis present

## 2022-11-03 DIAGNOSIS — Z882 Allergy status to sulfonamides status: Secondary | ICD-10-CM

## 2022-11-03 MED ORDER — LACTATED RINGERS IV BOLUS
1000.0000 mL | Freq: Once | INTRAVENOUS | Status: AC
  Administered 2022-11-04: 1000 mL via INTRAVENOUS

## 2022-11-03 MED ORDER — SODIUM CHLORIDE 0.9 % IV SOLN
1.0000 g | Freq: Once | INTRAVENOUS | Status: AC
  Administered 2022-11-04: 1 g via INTRAVENOUS
  Filled 2022-11-03: qty 10

## 2022-11-03 NOTE — ED Provider Notes (Incomplete)
[facility] Provider Note   CSN: [id number] Arrival date & time: 11/03/22  2141     History  Chief Complaint  Patient presents with  . Constipation    Patient to ED via EMS with complaint of constipation, generalized weakness and malaise. EMS reports patient appeared to have left side face droop and right arm weakness. EMS reports staff at [facility] were unable to give a LKN or History.    Deanna Rivera is a 89 y.o. female with medical history of hypertension, persistent A-fib on anticoagulation, polymyalgia rheumatica, secondary AV block, thyroid disease.  The patient presents to the ED for evaluation of constipation, shortness of breath, generalized weakness.  The patient reports that the last 9 days she has not had a bowel movement.  Patient denies any history of constipation, reports that she typically has a bowel movement every 2 to 3 days.  The patient husband and son at bedside report the patient has had numerous intra-abdominal surgeries to include cholecystectomy, appendectomy, hysterectomy.  Patient denies any history of bowel obstructions.  The patient was seen for this at her PCPs office on 3/22.  At this time the patient was advised to take MiraLAX along with Colace.  Patient reports that she did take MiraLAX however did not take the Colace because the pills were too big.  Patient is endorsing abdominal pain that is nonfocal in nature.  The patient is also endorsing nausea without vomiting.  Patient also goes on to state that for the last 3 days she has had shortness of breath along with "chest heaviness".  Patient arrives on oxygen, denies any oxygen use at home.  Patient reports history of CHF, reports compliance on Lasix.  Patient does endorse leg swelling.  Patient denies orthopnea.  Patient son at bedside reports that a paramedic advised him that the patient might of had a stroke.  The patient goes on to state that she does have some  decreased right lower extremity weakness however denies any facial droop, slurred speech, numbness.   Constipation      Home Medications Prior to Admission medications   Medication Sig Start Date End Date Taking? Authorizing Provider  acetaminophen (TYLENOL) 325 MG tablet Take 2 tablets (650 mg total) by mouth every 6 (six) hours as needed. 10/29/22   Jeanell Sparrow, DO  acetaminophen (TYLENOL) 500 MG tablet Take 500 mg by mouth every 6 (six) hours as needed for mild pain or headache.    [provider]  albuterol (VENTOLIN HFA) 108 (90 Base) MCG/ACT inhaler Inhale 2 puffs into the lungs every 6 (six) hours as needed for wheezing or shortness of breath. 11/02/22   Tawnya Crook, MD  apixaban Arne Cleveland) 2.5 MG TABS tablet TAKE 1 TABLET BY MOUTH TWICE  DAILY 10/15/22   Evans Lance, MD  cefdinir (OMNICEF) 300 MG capsule Take 1 capsule (300 mg total) by mouth 2 (two) times daily. 11/02/22   Tawnya Crook, MD  Cholecalciferol (VITAMIN D3) 25 MCG (1000 UT) CAPS Take 1 capsule by mouth daily.    [provider]  digoxin (LANOXIN) 0.125 MG tablet TAKE 1 TABLET BY MOUTH DAILY 07/09/22   Evans Lance, MD  dorzolamide-timolol (COSOPT) 22.3-6.8 MG/ML ophthalmic solution Place 1 drop into both eyes 2 (two) times daily. 03/30/21   [provider]  guaiFENesin-dextromethorphan (ROBITUSSIN DM) 100-10 MG/5ML syrup Take 5 mLs by mouth every 4 (four) hours as needed for cough. Patient not taking: Reported  on 11/02/2022 10/29/22   Wynona Dove A, DO  hydrochlorothiazide (HYDRODIURIL) 25 MG tablet TAKE 1 TABLET BY MOUTH DAILY 10/29/22   [name], MD  latanoprost (XALATAN) 0.005 % ophthalmic solution Place 1 drop into both eyes at bedtime. 03/13/21   [provider]  levothyroxine (SYNTHROID) 112 MCG tablet TAKE 1 TABLET BY MOUTH DAILY  BEFORE BREAKFAST 08/08/22   [name], MD  metoprolol succinate (TOPROL-XL) 50 MG 24 hr tablet TAKE 1 TABLET BY MOUTH DAILY  06/07/22   [name], MD  ondansetron (ZOFRAN) 4 MG tablet Take 1 tablet (4 mg total) by mouth every 8 (eight) hours as needed for nausea or vomiting. 10/29/22   Wynona Dove A, DO  potassium chloride (KLOR-CON 10) 10 MEQ tablet Take 1 tablet (10 mEq total) by mouth daily. 11/02/22   Tawnya Crook, MD  predniSONE (DELTASONE) 1 MG tablet TAKE 1 TABLET BY MOUTH DAILY 08/08/22   [name], MD      Allergies    Shellfish-derived products, Epinephrine, Monosodium glutamate, Monosodium glutamate, Ciprofloxacin, Dronedarone, Erythromycin, Sulfa antibiotics, Sulfamethoxazole-trimethoprim, and Tetracyclines & related    Review of Systems   Review of Systems  Gastrointestinal:  Positive for constipation.    Physical Exam Updated Vital Signs BP (!) 150/69   Pulse 63   Temp 98.3 F (36.8 C) (Oral)   Resp (!) 24   Ht 5\' 4"  (1.626 m)   Wt 54 kg   SpO2 95%   BMI 20.43 kg/m  Physical Exam  ED Results / Procedures / Treatments   Labs (all labs ordered are listed, but only abnormal results are displayed) Labs Reviewed  CULTURE, BLOOD (ROUTINE X 2)  CULTURE, BLOOD (ROUTINE X 2)  CBC  COMPREHENSIVE METABOLIC PANEL  LIPASE, BLOOD  URINALYSIS, ROUTINE W REFLEX MICROSCOPIC  BRAIN NATRIURETIC PEPTIDE  LACTIC ACID, PLASMA  LACTIC ACID, PLASMA  TROPONIN I (HIGH SENSITIVITY)    EKG None  Radiology No results found.  Procedures Procedures  {Document cardiac monitor, telemetry assessment procedure when appropriate:1}  Medications Ordered in ED Medications  lactated ringers bolus 1,000 mL (has no administration in time range)    ED Course/ Medical Decision Making/ A&P   {   Click here for ABCD2, HEART and other calculatorsREFRESH Note before signing :1}                          Medical Decision Making Amount and/or Complexity of Data Reviewed Labs: ordered. Radiology: ordered.   ***  {Document critical care time when appropriate:1} {Document review of labs and  clinical decision tools ie heart score, Chads2Vasc2 etc:1}  {Document your independent review of radiology images, and any outside records:1} {Document your discussion with family members, caretakers, and with consultants:1} {Document social determinants of health affecting pt's care:1} {Document your decision making why or why not admission, treatments were needed:1} Final Clinical Impression(s) / ED Diagnoses Final diagnoses:  None    Rx / DC Orders ED Discharge Orders     None

## 2022-11-03 NOTE — ED Provider Notes (Signed)
[facility] Provider Note   CSN: [id number] Arrival date & time: 11/03/22  2141     History  Chief Complaint  Patient presents with   Constipation    Patient to ED via EMS with complaint of constipation, generalized weakness and malaise. EMS reports patient appeared to have left side face droop and right arm weakness. EMS reports staff at [facility] were unable to give a LKN or History.    Deanna Rivera is a 89 y.o. female with medical history of hypertension, persistent A-fib on anticoagulation, polymyalgia rheumatica, secondary AV block, thyroid disease.  The patient presents to the ED for evaluation of constipation, shortness of breath, generalized weakness.  The patient reports that the last 9 days she has not had a bowel movement.  Patient denies any history of constipation, reports that she typically has a bowel movement every 2 to 3 days.  The patient husband and son at bedside report the patient has had numerous intra-abdominal surgeries to include cholecystectomy, appendectomy, hysterectomy.  Patient denies any history of bowel obstructions.  The patient was seen for this at her PCPs office on 3/22.  At this time the patient was advised to take MiraLAX along with Colace.  Patient reports that she did take MiraLAX however did not take the Colace because the pills were too big.  Patient is endorsing abdominal pain that is nonfocal in nature.  The patient is also endorsing nausea without vomiting.  Patient also goes on to state that for the last 3 days she has had shortness of breath along with "chest heaviness".  Patient arrives on oxygen, denies any oxygen use at home.  Patient reports history of CHF, reports compliance on Lasix.  Patient does endorse leg swelling.  Patient denies orthopnea.  Patient son at bedside reports that a paramedic advised him that the patient might've had a stroke.  The patient goes on to state that she does have some  decreased right lower extremity weakness however denies any facial droop, slurred speech, numbness.   Constipation Associated symptoms: abdominal pain   Associated symptoms: no dysuria and no fever        Home Medications Prior to Admission medications   Medication Sig Start Date End Date Taking? Authorizing Provider  acetaminophen (TYLENOL) 325 MG tablet Take 2 tablets (650 mg total) by mouth every 6 (six) hours as needed. 10/29/22   Jeanell Sparrow, DO  acetaminophen (TYLENOL) 500 MG tablet Take 500 mg by mouth every 6 (six) hours as needed for mild pain or headache.    [provider]  albuterol (VENTOLIN HFA) 108 (90 Base) MCG/ACT inhaler Inhale 2 puffs into the lungs every 6 (six) hours as needed for wheezing or shortness of breath. 11/02/22   Tawnya Crook, MD  apixaban Arne Cleveland) 2.5 MG TABS tablet TAKE 1 TABLET BY MOUTH TWICE  DAILY 10/15/22   Evans Lance, MD  cefdinir (OMNICEF) 300 MG capsule Take 1 capsule (300 mg total) by mouth 2 (two) times daily. 11/02/22   Tawnya Crook, MD  Cholecalciferol (VITAMIN D3) 25 MCG (1000 UT) CAPS Take 1 capsule by mouth daily.    [provider]  digoxin (LANOXIN) 0.125 MG tablet TAKE 1 TABLET BY MOUTH DAILY 07/09/22   Evans Lance, MD  dorzolamide-timolol (COSOPT) 22.3-6.8 MG/ML ophthalmic solution Place 1 drop into both eyes 2 (two) times daily. 03/30/21   [provider]  guaiFENesin-dextromethorphan (ROBITUSSIN DM) 100-10 MG/5ML syrup Take 5 mLs  by mouth every 4 (four) hours as needed for cough. Patient not taking: Reported on 11/02/2022 10/29/22   Wynona Dove A, DO  hydrochlorothiazide (HYDRODIURIL) 25 MG tablet TAKE 1 TABLET BY MOUTH DAILY 10/29/22   [name], MD  latanoprost (XALATAN) 0.005 % ophthalmic solution Place 1 drop into both eyes at bedtime. 03/13/21   [provider]  levothyroxine (SYNTHROID) 112 MCG tablet TAKE 1 TABLET BY MOUTH DAILY  BEFORE BREAKFAST 08/08/22   [name], MD   metoprolol succinate (TOPROL-XL) 50 MG 24 hr tablet TAKE 1 TABLET BY MOUTH DAILY 06/07/22   [name], MD  ondansetron (ZOFRAN) 4 MG tablet Take 1 tablet (4 mg total) by mouth every 8 (eight) hours as needed for nausea or vomiting. 10/29/22   Wynona Dove A, DO  potassium chloride (KLOR-CON 10) 10 MEQ tablet Take 1 tablet (10 mEq total) by mouth daily. 11/02/22   Tawnya Crook, MD  predniSONE (DELTASONE) 1 MG tablet TAKE 1 TABLET BY MOUTH DAILY 08/08/22   [name], MD      Allergies    Shellfish-derived products, Epinephrine, Monosodium glutamate, Monosodium glutamate, Ciprofloxacin, Dronedarone, Erythromycin, Sulfa antibiotics, Sulfamethoxazole-trimethoprim, and Tetracyclines & related    Review of Systems   Review of Systems  Constitutional:  Negative for fever.  Respiratory:  Positive for shortness of breath.   Cardiovascular:  Positive for chest pain. Negative for leg swelling.  Gastrointestinal:  Positive for abdominal pain and constipation.  Genitourinary:  Negative for dysuria.  Neurological:  Positive for weakness.  All other systems reviewed and are negative.   Physical Exam Updated Vital Signs BP 128/69   Pulse 61   Temp 98 F (36.7 C)   Resp 20   Ht 5\' 4"  (1.626 m)   Wt 54 kg   SpO2 98%   BMI 20.43 kg/m  Physical Exam Vitals and nursing note reviewed.  Constitutional:      General: She is not in acute distress. HENT:     Head: Normocephalic and atraumatic.     Nose: Nose normal.     Mouth/Throat:     Mouth: Mucous membranes are dry.  Eyes:     Extraocular Movements: Extraocular movements intact.     Pupils: Pupils are equal, round, and reactive to light.  Cardiovascular:     Rate and Rhythm: Normal rate.  Pulmonary:     Effort: Pulmonary effort is normal.     Breath sounds: Rales present.     Comments: RLL crackles Abdominal:     General: Abdomen is flat.     Tenderness: There is abdominal tenderness.     Comments: Diffuse tenderness, no  rebound, no guarding, no overlying skin change.   Musculoskeletal:     Cervical back: Normal range of motion and neck supple. No tenderness.  Skin:    General: Skin is warm and dry.     Capillary Refill: Capillary refill takes less than 2 seconds.  Neurological:     General: No focal deficit present.     Mental Status: She is alert and oriented to person, place, and time.     GCS: GCS eye subscore is 4. GCS verbal subscore is 5. GCS motor subscore is 6.     Cranial Nerves: Cranial nerves 2-12 are intact. No cranial nerve deficit.     Sensory: Sensation is intact. No sensory deficit.     Motor: Motor function is intact. No weakness.     Coordination: Coordination is intact.  Heel to [facility] Test normal.     Comments: No facial droop, no slurred speech.  Cranial nerves II through XII intact.  Intact finger-nose and heel-to-shin.  Patient does have very slight 4/5 decreased strength right lower extremity.  No pronator drift.     ED Results / Procedures / Treatments   Labs (all labs ordered are listed, but only abnormal results are displayed) Labs Reviewed  CBC - Abnormal; Notable for the following components:      Result Value   Hemoglobin 15.1 (*)    All other components within normal limits  COMPREHENSIVE METABOLIC PANEL - Abnormal; Notable for the following components:   Sodium 132 (*)    Potassium 3.1 (*)    Chloride 91 (*)    Glucose, Bld 122 (*)    Creatinine, Ser 0.34 (*)    Albumin 2.9 (*)    All other components within normal limits  BRAIN NATRIURETIC PEPTIDE - Abnormal; Notable for the following components:   B Natriuretic Peptide 123.8 (*)    All other components within normal limits  I-STAT CHEM 8, ED - Abnormal; Notable for the following components:   Sodium 130 (*)    Potassium 6.1 (*)    Chloride 94 (*)    Creatinine, Ser 0.20 (*)    Glucose, Bld 122 (*)    Calcium, Ion 1.06 (*)    TCO2 33 (*)    All other components within normal limits  TROPONIN I (HIGH  SENSITIVITY) - Abnormal; Notable for the following components:   Troponin I (High Sensitivity) 21 (*)    All other components within normal limits  TROPONIN I (HIGH SENSITIVITY) - Abnormal; Notable for the following components:   Troponin I (High Sensitivity) 20 (*)    All other components within normal limits  CULTURE, BLOOD (ROUTINE X 2)  CULTURE, BLOOD (ROUTINE X 2)  LIPASE, BLOOD  LACTIC ACID, PLASMA  LACTIC ACID, PLASMA  DIGOXIN LEVEL  C-REACTIVE PROTEIN  SEDIMENTATION RATE  CK  BASIC METABOLIC PANEL  MAGNESIUM  PHOSPHORUS  CBC    EKG EKG Interpretation  Date/Time:  Saturday November 03 2022 21:53:09 EDT Ventricular Rate:  65 PR Interval:  66 QRS Duration: 91 QT Interval:  318 QTC Calculation: 315 R Axis:   -72 Text Interpretation: Atrial fibrillation Supraventricular bigeminy Short PR interval Inferior infarct, old Anterolateral infarct, age indeterminate ventricular paced complexes Confirmed by Quintella Reichert [phone]) on 11/03/2022 11:04:23 PM  Radiology CT ABDOMEN PELVIS W CONTRAST  Result Date: 11/04/2022 CLINICAL DATA:  Abdominal pain EXAM: CT ABDOMEN AND PELVIS WITH CONTRAST TECHNIQUE: Multidetector CT imaging of the abdomen and pelvis was performed using the standard protocol following bolus administration of intravenous contrast. RADIATION DOSE REDUCTION: This exam was performed according to the departmental dose-optimization program which includes automated exposure control, adjustment of the mA and/or kV according to patient size and/or use of iterative reconstruction technique. CONTRAST:  63mL OMNIPAQUE IOHEXOL 350 MG/ML SOLN COMPARISON:  03/08/2020 FINDINGS: Lower chest: Small right-sided pleural effusion is noted. Atelectatic changes are noted in the right middle and lower lobes as well as in the left lower lobe. Cardiac shadow is enlarged. Hepatobiliary: No focal liver abnormality is seen. Status post cholecystectomy. No biliary dilatation. Pancreas: Unremarkable.  No pancreatic ductal dilatation or surrounding inflammatory changes. Spleen: Normal in size without focal abnormality. Adrenals/Urinary Tract: Adrenal glands are within normal limits. Kidneys demonstrate a normal enhancement pattern. Normal excretion is noted bilaterally. The bladder is well distended. Stomach/Bowel: Diverticular disease of the colon  is noted. No evidence of diverticulitis is seen. The appendix has been surgically removed. Small bowel and stomach are within normal limits. Vascular/Lymphatic: Aortic atherosclerosis. No enlarged abdominal or pelvic lymph nodes. Reproductive: Status post hysterectomy. No adnexal masses. Other: No abdominal wall hernia or abnormality. No abdominopelvic ascites. Musculoskeletal: Mild degenerative changes of lumbar spine are noted. IMPRESSION: Diverticulosis without diverticulitis. Bilateral atelectatic changes as described with associated right-sided pleural effusion. Electronically Signed   By: Inez Catalina M.D.   On: 11/04/2022 02:17   CT Head Wo Contrast  Result Date: 11/04/2022 CLINICAL DATA:  Altered mental status EXAM: CT HEAD WITHOUT CONTRAST TECHNIQUE: Contiguous axial images were obtained from the base of the skull through the vertex without intravenous contrast. RADIATION DOSE REDUCTION: This exam was performed according to the departmental dose-optimization program which includes automated exposure control, adjustment of the mA and/or kV according to patient size and/or use of iterative reconstruction technique. COMPARISON:  09/04/2021 FINDINGS: Brain: No evidence of acute infarction, hemorrhage, hydrocephalus, extra-axial collection or mass lesion/mass effect. Small lacunar infarct is noted in the left basal ganglia. Vascular: No hyperdense vessel or unexpected calcification. Skull: Normal. Negative for fracture or focal lesion. Sinuses/Orbits: No acute finding. Other: None. IMPRESSION: No acute intracranial abnormality noted. Chronic ischemic changes are  noted. Electronically Signed   By: Inez Catalina M.D.   On: 11/04/2022 02:14   DG Chest 2 View  Result Date: 11/03/2022 CLINICAL DATA:  Shortness of breath and hypoxia, initial encounter EXAM: CHEST - 2 VIEW COMPARISON:  10/29/2022 FINDINGS: Cardiac shadow is enlarged but stable. Pacing device is again seen. Increasing right basilar infiltrate is noted. No effusion or pneumothorax is seen. IMPRESSION: Increasing right lower lobe and right middle lobe infiltrate. Electronically Signed   By: Inez Catalina M.D.   On: 11/03/2022 23:38    Procedures Procedures   Medications Ordered in ED Medications  digoxin (LANOXIN) tablet 125 mcg (has no administration in time range)  metoprolol succinate (TOPROL-XL) 24 hr tablet 50 mg (has no administration in time range)  levothyroxine (SYNTHROID) tablet 112 mcg (has no administration in time range)  predniSONE (DELTASONE) tablet 1 mg (has no administration in time range)  apixaban (ELIQUIS) tablet 2.5 mg (has no administration in time range)  albuterol (PROVENTIL) (2.5 MG/3ML) 0.083% nebulizer solution 3 mL (has no administration in time range)  acetaminophen (TYLENOL) tablet 650 mg (has no administration in time range)    Or  acetaminophen (TYLENOL) suppository 650 mg (has no administration in time range)  polyethylene glycol (MIRALAX / GLYCOLAX) packet 17 g (has no administration in time range)  bisacodyl (DULCOLAX) EC tablet 5 mg (has no administration in time range)  potassium chloride (KLOR-CON) packet 20 mEq (has no administration in time range)  lactated ringers bolus 1,000 mL (0 mLs Intravenous Stopped 11/04/22 0220)  cefTRIAXone (ROCEPHIN) 1 g in sodium chloride 0.9 % 100 mL IVPB (0 g Intravenous Stopped 11/04/22 0051)  potassium chloride 10 mEq in 100 mL IVPB (0 mEq Intravenous Stopped 11/04/22 0304)  iohexol (OMNIPAQUE) 350 MG/ML injection 75 mL (75 mLs Intravenous Contrast Given 11/04/22 0201)    ED Course/ Medical Decision Making/ A&P  Medical  Decision Making Amount and/or Complexity of Data Reviewed Labs: ordered. Radiology: ordered.  Risk Prescription drug management.   89 year old female presents to the ED for evaluation.  Please see HPI for further details.  On examination the patient is afebrile, nontachycardic.  Patient lung sounds have crackles in right lower lobe lung field, patient has an  oxygen saturation of 91% on room air.  Oxygen saturation improved to 99% with 2 L via nasal cannula.  Abdomen is soft, compressible however the patient does have nonfocal tenderness without rebound or guarding.  Neurological examination shows no focal neurodeficits.  The patient does have slightly decreased strength to her right lower extremity 4 out of 5.  However no facial droop, no slurred speech.  Intact finger-nose and heel-to-shin.  Patient presents with numerous complaints, she has numerous comorbidities.  Will begin broad workup to include CBC, CMP, lipase, BNP, troponin x 2, lactic acid x 2, blood culture, digoxin level.  Will image patient utilizing chest x-ray to assess for pneumonia or underlying lung disease.  Will collect CT head to rule out intracranial abnormality, CT abdomen pelvis to rule out bowel obstruction or other intra-abdominal processes.  Patient CBC without leukocytosis, no anemia.  Patient CMP with decreased sodium to 132, decrease potassium to 3.1.  Patient given 1 L LR at reduced rate secondary to CHF however patient on recent echocardiogram had ejection fraction that was maintained at 65%.  Patient does not appear volume overloaded without any signs of peripheral edema.  Patient given IV potassium 10 mEq.  Patient lactic acid 1.3 initially, 1.6 on repeat evaluation.  Patient BNP 123.8 however patient BNP was 383 3 days ago.  Patient troponin initially 21, delta of 20.  EKG is nonischemic.  Digoxin level 1.3.  Patient chest x-ray shows possible right lower lobe pneumonia, increased infiltrate.  Patient started on  Rocephin at this time, patient reports she has erythromycin allergy so will not start on azithromycin at this time.  CT scan of patient head shows no acute intracranial abnormality.  CT scan of patient abdomen and pelvis does show no evidence of bowel obstruction, patient does have diverticulosis without evidence of diverticulitis.  Due to patient age, comorbidities and decreased oxygen saturation me my attending feels that this patient will need admission to the hospital for further management.  I have paged [facility] hospitalist at this time.  Dr. Myna Hidalgo, [facility] hospitalist, has returned my call.  Dr. Myna Hidalgo has come down and seen the patient, has agreed to admit the patient for observation.  The patient will be admitted.  Final Clinical Impression(s) / ED Diagnoses Final diagnoses:  Weakness  Community acquired pneumonia of right lower lobe of lung    Rx / DC Orders ED Discharge Orders     None         Lawana Chambers 11/04/22 0432    Isla Pence, MD 11/04/22 2302

## 2022-11-04 ENCOUNTER — Emergency Department (HOSPITAL_COMMUNITY): Payer: Medicare Other

## 2022-11-04 ENCOUNTER — Encounter (HOSPITAL_COMMUNITY): Payer: Self-pay | Admitting: Family Medicine

## 2022-11-04 ENCOUNTER — Other Ambulatory Visit: Payer: Self-pay

## 2022-11-04 DIAGNOSIS — I10 Essential (primary) hypertension: Secondary | ICD-10-CM

## 2022-11-04 DIAGNOSIS — G7241 Inclusion body myositis [IBM]: Secondary | ICD-10-CM | POA: Diagnosis present

## 2022-11-04 DIAGNOSIS — E861 Hypovolemia: Secondary | ICD-10-CM | POA: Diagnosis present

## 2022-11-04 DIAGNOSIS — R531 Weakness: Secondary | ICD-10-CM | POA: Diagnosis present

## 2022-11-04 DIAGNOSIS — E871 Hypo-osmolality and hyponatremia: Secondary | ICD-10-CM | POA: Diagnosis present

## 2022-11-04 DIAGNOSIS — J690 Pneumonitis due to inhalation of food and vomit: Secondary | ICD-10-CM | POA: Diagnosis present

## 2022-11-04 DIAGNOSIS — Z7901 Long term (current) use of anticoagulants: Secondary | ICD-10-CM | POA: Diagnosis not present

## 2022-11-04 DIAGNOSIS — E8809 Other disorders of plasma-protein metabolism, not elsewhere classified: Secondary | ICD-10-CM | POA: Diagnosis present

## 2022-11-04 DIAGNOSIS — R1313 Dysphagia, pharyngeal phase: Secondary | ICD-10-CM | POA: Diagnosis present

## 2022-11-04 DIAGNOSIS — E039 Hypothyroidism, unspecified: Secondary | ICD-10-CM | POA: Diagnosis present

## 2022-11-04 DIAGNOSIS — I5032 Chronic diastolic (congestive) heart failure: Secondary | ICD-10-CM

## 2022-11-04 DIAGNOSIS — Z8249 Family history of ischemic heart disease and other diseases of the circulatory system: Secondary | ICD-10-CM | POA: Diagnosis not present

## 2022-11-04 DIAGNOSIS — E876 Hypokalemia: Secondary | ICD-10-CM | POA: Diagnosis present

## 2022-11-04 DIAGNOSIS — I110 Hypertensive heart disease with heart failure: Secondary | ICD-10-CM | POA: Diagnosis present

## 2022-11-04 DIAGNOSIS — E43 Unspecified severe protein-calorie malnutrition: Secondary | ICD-10-CM | POA: Diagnosis present

## 2022-11-04 DIAGNOSIS — R5381 Other malaise: Secondary | ICD-10-CM | POA: Diagnosis not present

## 2022-11-04 DIAGNOSIS — I4819 Other persistent atrial fibrillation: Secondary | ICD-10-CM

## 2022-11-04 DIAGNOSIS — H409 Unspecified glaucoma: Secondary | ICD-10-CM | POA: Diagnosis present

## 2022-11-04 DIAGNOSIS — J9811 Atelectasis: Secondary | ICD-10-CM | POA: Diagnosis present

## 2022-11-04 DIAGNOSIS — Z87891 Personal history of nicotine dependence: Secondary | ICD-10-CM | POA: Diagnosis not present

## 2022-11-04 DIAGNOSIS — I4821 Permanent atrial fibrillation: Secondary | ICD-10-CM | POA: Diagnosis present

## 2022-11-04 DIAGNOSIS — Z79890 Hormone replacement therapy (postmenopausal): Secondary | ICD-10-CM | POA: Diagnosis not present

## 2022-11-04 DIAGNOSIS — K5900 Constipation, unspecified: Secondary | ICD-10-CM | POA: Diagnosis present

## 2022-11-04 DIAGNOSIS — I080 Rheumatic disorders of both mitral and aortic valves: Secondary | ICD-10-CM | POA: Diagnosis present

## 2022-11-04 DIAGNOSIS — I495 Sick sinus syndrome: Secondary | ICD-10-CM | POA: Diagnosis present

## 2022-11-04 DIAGNOSIS — M353 Polymyalgia rheumatica: Secondary | ICD-10-CM | POA: Diagnosis present

## 2022-11-04 DIAGNOSIS — E063 Autoimmune thyroiditis: Secondary | ICD-10-CM

## 2022-11-04 DIAGNOSIS — E038 Other specified hypothyroidism: Secondary | ICD-10-CM

## 2022-11-04 DIAGNOSIS — Z6820 Body mass index (BMI) 20.0-20.9, adult: Secondary | ICD-10-CM | POA: Diagnosis not present

## 2022-11-04 LAB — CBC
HCT: 40.8 % (ref 36.0–46.0)
HCT: 43.7 % (ref 36.0–46.0)
Hemoglobin: 13.5 g/dL (ref 12.0–15.0)
Hemoglobin: 15.1 g/dL — ABNORMAL HIGH (ref 12.0–15.0)
MCH: 31.5 pg (ref 26.0–34.0)
MCH: 31.6 pg (ref 26.0–34.0)
MCHC: 33.1 g/dL (ref 30.0–36.0)
MCHC: 34.6 g/dL (ref 30.0–36.0)
MCV: 91.4 fL (ref 80.0–100.0)
MCV: 95.1 fL (ref 80.0–100.0)
Platelets: 190 10*3/uL (ref 150–400)
Platelets: 207 10*3/uL (ref 150–400)
RBC: 4.29 MIL/uL (ref 3.87–5.11)
RBC: 4.78 MIL/uL (ref 3.87–5.11)
RDW: 12.6 % (ref 11.5–15.5)
RDW: 12.8 % (ref 11.5–15.5)
WBC: 7.2 10*3/uL (ref 4.0–10.5)
WBC: 8.2 10*3/uL (ref 4.0–10.5)
nRBC: 0 % (ref 0.0–0.2)
nRBC: 0 % (ref 0.0–0.2)

## 2022-11-04 LAB — C-REACTIVE PROTEIN: CRP: 6.5 mg/dL — ABNORMAL HIGH (ref <1.0)

## 2022-11-04 LAB — COMPREHENSIVE METABOLIC PANEL
ALT: 26 U/L (ref 0–44)
AST: 33 U/L (ref 15–41)
Albumin: 2.9 g/dL — ABNORMAL LOW (ref 3.5–5.0)
Alkaline Phosphatase: 46 U/L (ref 38–126)
Anion gap: 15 (ref 5–15)
BUN: 13 mg/dL (ref 8–23)
CO2: 26 mmol/L (ref 22–32)
Calcium: 9.5 mg/dL (ref 8.9–10.3)
Chloride: 91 mmol/L — ABNORMAL LOW (ref 98–111)
Creatinine, Ser: 0.34 mg/dL — ABNORMAL LOW (ref 0.44–1.00)
GFR, Estimated: >60 mL/min (ref >60)
Glucose, Bld: 122 mg/dL — ABNORMAL HIGH (ref 70–99)
Potassium: 3.1 mmol/L — ABNORMAL LOW (ref 3.5–5.1)
Sodium: 132 mmol/L — ABNORMAL LOW (ref 135–145)
Total Bilirubin: 0.9 mg/dL (ref 0.3–1.2)
Total Protein: 7.1 g/dL (ref 6.5–8.1)

## 2022-11-04 LAB — I-STAT CHEM 8, ED
BUN: 18 mg/dL (ref 8–23)
Calcium, Ion: 1.06 mmol/L — ABNORMAL LOW (ref 1.15–1.40)
Chloride: 94 mmol/L — ABNORMAL LOW (ref 98–111)
Creatinine, Ser: 0.2 mg/dL — ABNORMAL LOW (ref 0.44–1.00)
Glucose, Bld: 122 mg/dL — ABNORMAL HIGH (ref 70–99)
HCT: 44 % (ref 36.0–46.0)
Hemoglobin: 15 g/dL (ref 12.0–15.0)
Potassium: 6.1 mmol/L — ABNORMAL HIGH (ref 3.5–5.1)
Sodium: 130 mmol/L — ABNORMAL LOW (ref 135–145)
TCO2: 33 mmol/L — ABNORMAL HIGH (ref 22–32)

## 2022-11-04 LAB — BASIC METABOLIC PANEL
Anion gap: 9 (ref 5–15)
BUN: 10 mg/dL (ref 8–23)
CO2: 28 mmol/L (ref 22–32)
Calcium: 8.8 mg/dL — ABNORMAL LOW (ref 8.9–10.3)
Chloride: 95 mmol/L — ABNORMAL LOW (ref 98–111)
Creatinine, Ser: <0.3 mg/dL — ABNORMAL LOW (ref 0.44–1.00)
Glucose, Bld: 103 mg/dL — ABNORMAL HIGH (ref 70–99)
Potassium: 3.1 mmol/L — ABNORMAL LOW (ref 3.5–5.1)
Sodium: 132 mmol/L — ABNORMAL LOW (ref 135–145)

## 2022-11-04 LAB — PHOSPHORUS: Phosphorus: 2.8 mg/dL (ref 2.5–4.6)

## 2022-11-04 LAB — TROPONIN I (HIGH SENSITIVITY)
Troponin I (High Sensitivity): 21 ng/L — ABNORMAL HIGH (ref <18)
Troponin I (High Sensitivity): 20 ng/L — ABNORMAL HIGH (ref <18)

## 2022-11-04 LAB — BRAIN NATRIURETIC PEPTIDE: B Natriuretic Peptide: 123.8 pg/mL — ABNORMAL HIGH (ref 0.0–100.0)

## 2022-11-04 LAB — SEDIMENTATION RATE: Sed Rate: 58 mm/hr — ABNORMAL HIGH (ref 0–22)

## 2022-11-04 LAB — CK: Total CK: 61 U/L (ref 38–234)

## 2022-11-04 LAB — MAGNESIUM: Magnesium: 1.5 mg/dL — ABNORMAL LOW (ref 1.7–2.4)

## 2022-11-04 LAB — LIPASE, BLOOD: Lipase: 42 U/L (ref 11–51)

## 2022-11-04 LAB — LACTIC ACID, PLASMA
Lactic Acid, Venous: 1.3 mmol/L (ref 0.5–1.9)
Lactic Acid, Venous: 1.6 mmol/L (ref 0.5–1.9)

## 2022-11-04 LAB — DIGOXIN LEVEL: Digoxin Level: 1.3 ng/mL (ref 0.8–2.0)

## 2022-11-04 MED ORDER — BISACODYL 5 MG PO TBEC: 5.0000 mg | DELAYED_RELEASE_TABLET | Freq: Every day | ORAL | Status: DC | PRN

## 2022-11-04 MED ORDER — LEVOTHYROXINE SODIUM 112 MCG PO TABS
112.0000 ug | ORAL_TABLET | Freq: Every day | ORAL | Status: DC
  Administered 2022-11-04 – 2022-11-07 (×4): 112 ug via ORAL
  Filled 2022-11-04 (×4): qty 1

## 2022-11-04 MED ORDER — IOHEXOL 350 MG/ML SOLN
75.0000 mL | Freq: Once | INTRAVENOUS | Status: AC | PRN
  Administered 2022-11-04: 75 mL via INTRAVENOUS

## 2022-11-04 MED ORDER — POTASSIUM CHLORIDE 20 MEQ PO PACK
20.0000 meq | PACK | Freq: Once | ORAL | Status: AC
  Administered 2022-11-04: 20 meq via ORAL
  Filled 2022-11-04: qty 1

## 2022-11-04 MED ORDER — POTASSIUM CHLORIDE 10 MEQ/100ML IV SOLN
10.0000 meq | INTRAVENOUS | Status: AC
  Administered 2022-11-04 (×3): 10 meq via INTRAVENOUS
  Filled 2022-11-04 (×3): qty 100

## 2022-11-04 MED ORDER — POLYETHYLENE GLYCOL 3350 17 G PO PACK
17.0000 g | PACK | Freq: Every day | ORAL | Status: DC
  Administered 2022-11-04: 17 g via ORAL
  Filled 2022-11-04: qty 1

## 2022-11-04 MED ORDER — METOPROLOL SUCCINATE ER 50 MG PO TB24
50.0000 mg | ORAL_TABLET | Freq: Every day | ORAL | Status: DC
  Administered 2022-11-04 – 2022-11-07 (×4): 50 mg via ORAL
  Filled 2022-11-04 (×4): qty 1

## 2022-11-04 MED ORDER — GUAIFENESIN ER 600 MG PO TB12
1200.0000 mg | ORAL_TABLET | Freq: Two times a day (BID) | ORAL | Status: DC
  Filled 2022-11-04: qty 2

## 2022-11-04 MED ORDER — DIGOXIN 125 MCG PO TABS
125.0000 ug | ORAL_TABLET | Freq: Every day | ORAL | Status: DC
  Administered 2022-11-04 – 2022-11-07 (×4): 125 ug via ORAL
  Filled 2022-11-04 (×4): qty 1

## 2022-11-04 MED ORDER — POLYETHYLENE GLYCOL 3350 17 G PO PACK: 17.0000 g | PACK | Freq: Every day | ORAL | Status: DC | PRN

## 2022-11-04 MED ORDER — ORAL CARE MOUTH RINSE
15.0000 mL | OROMUCOSAL | Status: DC
  Administered 2022-11-04 – 2022-11-07 (×9): 15 mL via OROMUCOSAL

## 2022-11-04 MED ORDER — POTASSIUM CHLORIDE 20 MEQ PO PACK
40.0000 meq | PACK | Freq: Once | ORAL | Status: AC
  Administered 2022-11-04: 40 meq via ORAL
  Filled 2022-11-04: qty 2

## 2022-11-04 MED ORDER — ORAL CARE MOUTH RINSE: 15.0000 mL | OROMUCOSAL | Status: DC | PRN

## 2022-11-04 MED ORDER — ACETAMINOPHEN 650 MG RE SUPP: 650.0000 mg | Freq: Four times a day (QID) | RECTAL | Status: DC | PRN

## 2022-11-04 MED ORDER — ACETAMINOPHEN 325 MG PO TABS
650.0000 mg | ORAL_TABLET | Freq: Four times a day (QID) | ORAL | Status: DC | PRN
  Administered 2022-11-06: 650 mg via ORAL
  Filled 2022-11-04 (×2): qty 2

## 2022-11-04 MED ORDER — PREDNISONE 1 MG PO TABS
1.0000 mg | ORAL_TABLET | Freq: Every day | ORAL | Status: DC
  Administered 2022-11-04 – 2022-11-07 (×4): 1 mg via ORAL
  Filled 2022-11-04 (×4): qty 1

## 2022-11-04 MED ORDER — MAGNESIUM SULFATE 2 GM/50ML IV SOLN
2.0000 g | Freq: Once | INTRAVENOUS | Status: AC
  Administered 2022-11-04: 2 g via INTRAVENOUS
  Filled 2022-11-04: qty 50

## 2022-11-04 MED ORDER — APIXABAN 2.5 MG PO TABS
2.5000 mg | ORAL_TABLET | Freq: Two times a day (BID) | ORAL | Status: DC
  Administered 2022-11-04 – 2022-11-07 (×7): 2.5 mg via ORAL
  Filled 2022-11-04 (×7): qty 1

## 2022-11-04 MED ORDER — BISACODYL 5 MG PO TBEC
5.0000 mg | DELAYED_RELEASE_TABLET | Freq: Every day | ORAL | Status: DC
  Administered 2022-11-04: 5 mg via ORAL
  Filled 2022-11-04: qty 1

## 2022-11-04 MED ORDER — ALBUTEROL SULFATE (2.5 MG/3ML) 0.083% IN NEBU: 3.0000 mL | INHALATION_SOLUTION | Freq: Four times a day (QID) | RESPIRATORY_TRACT | Status: DC | PRN

## 2022-11-04 MED ORDER — GUAIFENESIN 100 MG/5ML PO LIQD
5.0000 mL | ORAL | Status: DC | PRN
  Administered 2022-11-04: 5 mL via ORAL
  Filled 2022-11-04: qty 15

## 2022-11-04 MED ORDER — POTASSIUM CHLORIDE 10 MEQ/100ML IV SOLN
10.0000 meq | Freq: Once | INTRAVENOUS | Status: AC
  Administered 2022-11-04: 10 meq via INTRAVENOUS
  Filled 2022-11-04: qty 100

## 2022-11-04 MED ORDER — SODIUM CHLORIDE 0.9 % IV SOLN: INTRAVENOUS | Status: AC

## 2022-11-04 NOTE — Progress Notes (Signed)
Care started prior to midnight in the emergency room and patient was admitted early this morning after midnight by Dr. Christia Reading Opyd and I am in current agreement with his assessment and plan.  Additional changes to the plan of care been made accordingly.  The patient is a elderly 89 year old chronically ill-appearing Caucasian female with a past medical history significant for but limited to heart block status post pacemaker placement, permanent atrial fibrillation on anticoagulation with Eliquis, hypothyroidism, inclusion body myositis, hypertension, recent influenza A infection who presented to the emergency department for constipation and progressively worsening weakness.  She had developed a cough, fatigue, nausea, vomiting and loss of appetite around 10/26/2022 and was diagnosed with influenza A.  While her nausea, vomiting and loss of appetite have resolved her cough has been slowly improving but she has suffered significantly worsened generalized weakness to the point where she is unable to get out of the bed for the last few days.  She been on ambulatory prior to her recent illness and has a history of muscle weakness going back to the 1970s when she was diagnosed with inclusion body myositis in [place].  She saw a local rheumatologist in 2022 who appeared to agree with the diagnosis and has progressive weakness over the years that is attributed to this.  In arrival to the ED she is found to be afebrile and saturating mid 90s on room air and EKG demonstrated atrial fibrillation.  Head CT was negative for any acute intracranial abnormalities and CT scan of the abdomen pelvis was negative for any acute intra-abdominal or pelvic abnormalities but was notable for small right pleural effusion and atelectasis involving right mid right lower and left lower lobes.  Labs were notable for potassium of 3.1 and albumin of 2.9 WBC and lactic acid were normal.  In the ED blood cultures obtained and she was given 1 L  of lactated Ringer's as well as 10 mEq of IV potassium and 1 g of IV Rocephin.  She was placed on 2 L supplemental oxygen and currently is being admitted and treated for the following but not limited to:  General weakness; deconditioning  -Patient with progressive weakness over decades attributed to inclusion body myositis and possible PMR presents with increased weakness/unable to get out of bed   -No acute findings on head CT and no definite focal deficit on exam; TSH was normal a few days ago  - Suspect this is d/t deconditioning after recent influenza with underlying inclusion body myositis  -Checked CK and was 61, ESR was 58, CRP was 6.5, -Mag and Phos Trend: Recent Labs  Lab 11/04/22 0445  MG 1.5*  PHOS 2.8  -Consulted PT and OT for further evaluation recommendations -Continue with supportive care and will consider MRI brain if fails to improve   -Will start gentle IV fluid hydration with normal saline at 75 MLS per hour -Will consult dietitian given her poor p.o. intake   Atelectasis  - Saturating low-mid 90s on room air, noted to have small pleural effusion and atelectasis on CT as it showed "Bilateral atelectatic changes as described with associated right-sided pleural effusion." - Incentive spirometry and will add flutter valve as well as guaifenesin 1200 g p.o. twice daily -SpO2: 99 % O2 Flow Rate (L/min): 2 L/min -Continue with albuterol 3 mL IH every 6 as needed for wheezing or shortness of breath -Will need continuous pulse oximetry maintain O2 saturation greater than 90% -Continue supplemental oxygen via nasal cannula and wean O2 as tolerated -  She will need an Ambulatory home O2 screen prior to discharge   Atrial fibrillation  -Continue anticoagulation with apixaban 2.5 mg p.o. twice daily -Continue with digoxin 1.5 mcg p.o. daily as well as metoprolol succinate 50 mg p.o. daily -Continue to monitor on MedSurg and moved to telemetry if necessary   Inclusion body  myositis; PMR   -Continue low-dose prednisone  -Follow-up with rheumatology outpatient and neurology as planned     Hypertension  -Continue metoprolol succinate 50 mg p.o. daily and hold hydrochlorothiazide while she is getting IV fluid hydration and therapies -Continue to monitor blood pressures per protocol -Her last blood pressure reading was 121/62   Hypothyroidism  -Recent TSH was 0.510, free T4 was 1.41 and T3 free serum was 0.9 -Continue with levothyroxine 112 mcg p.o. daily   Chronic HFpEF  - Appeared hypovolemic in ED in setting of recent anorexia and N/V and was given 1 liter LR  -Still appears a little dry so we will start gentle IV fluid hydration with normal saline at 75 MLS per hour for 1 day -Strict I's and O's and daily weights and will need to continue monitor lites status carefully and for signs and symptoms of volume overload  Intake/Output Summary (Last 24 hours) at 11/04/2022 1007 Last data filed at 11/04/2022 0759 Gross per 24 hour  Intake 1237.34 ml  Output --  Net 1237.34 ml    Hyponatremia -Na+ Trend: Recent Labs  Lab 10/23/22 1406 10/29/22 0957 11/03/22 2350 11/04/22 0019 11/04/22 0445  NA 136 133* 132* 130* 132*  -Hold hydrochlorothiazide for now and will get gentle IV fluid hydration with normal saline at 75 MLS per hour for now  Constipation -Continue with bowel regimen and have initiated bisacodyl 5 mg p.o. daily as needed for moderate constipation as well as MiraLAX 17 g p.o. daily.  Hypokalemia -Patient's K+ Level Trend: Recent Labs  Lab 10/23/22 1406 10/29/22 0957 11/03/22 2350 11/04/22 0019 11/04/22 0445  K 4.0 3.0* 3.1* 6.1* 3.1*  -Replete with 40 mill colons p.o. once as well as 30 mill equivalents IV -Continue to Monitor and Replete as Necessary -Repeat CMP in the AM   Hypomagnesemia -Patient's Mag Level Trend: Recent Labs  Lab 11/04/22 0445  MG 1.5*  -Replete with IV Mag Sulfate 2 grams -Continue to Monitor and Replete as  Necessary -Repeat Mag in the AM   Hypoalbuminemia -Patient's Albumin Trend: Recent Labs  Lab 10/29/22 0957 11/03/22 2350  ALBUMIN 3.5 2.9*  -Continue to Monitor and Trend and repeat CMP in the AM  Will continue to monitor the patient's clinical response to intervention and repeat blood work and imaging in the a.m. with a repeat chest x-ray in the morning

## 2022-11-04 NOTE — TOC Initial Note (Addendum)
Transition of Care [facility]) - Initial/Assessment Note    Patient Details  Name: Deanna Rivera MRN: [id number] Date of Birth: 1933/01/15  Transition of Care [facility]) CM/SW Contact:    Milas Gain, [place] Phone Number: 11/04/2022, 1:33 PM  Clinical Narrative:                  CSW received consult for possible SNF placement at time of discharge. Patient is currently medicare obs. But is [facility].CSW tomorrow will confirm.CSW called into patients room and spoke with patient regarding PT recommendation of SNF placement at time of discharge. Patient informed CSW she is hard of hearing she  gave permission for CSW to speak with her daughter-n-law who was in room Robin. Shirlean Mylar reports PTA patient  comes from [place] with her spouse. Robin patients daughter-n-law expressed patients understanding of PT recommendation and and that patient is agreeable to SNF placement at time of discharge. Patients daughter-n-law informed CSW patient would like for CSW to fax out near the [place] area for possible SNF placement. CSW discussed insurance authorization process.No further questions reported at this time. CSW to continue to follow and assist with discharge planning needs.   Expected Discharge Plan: Skilled Nursing Facility Barriers to Discharge: Continued Medical Work up   Patient Goals and CMS Choice Patient states their goals for this hospitalization and ongoing recovery are:: SNF          Expected Discharge Plan and Services In-house Referral: Clinical Social Work     Living arrangements for the past 2 months: [place] [employer] officer, political [employer] [facility])                                      Prior Living Arrangements/Services Living arrangements for the past 2 months: [place] ([facility]) Lives with:: Spouse Patient language and need for interpreter reviewed:: Yes Do you feel safe going back to the place where you live?: No   SNF  Need  for Family Participation in Patient Care: Yes (Comment) Care giver support system in place?: Yes (comment)   Criminal Activity/Legal Involvement Pertinent to Current Situation/Hospitalization: No - Comment as needed  Activities of Daily Living Home Assistive Devices/Equipment: Walker (specify type) ADL Screening (condition at time of admission) Patient's cognitive ability adequate to safely complete daily activities?: Yes Is the patient deaf or have difficulty hearing?: Yes Does the patient have difficulty seeing, even when wearing glasses/contacts?: No Does the patient have difficulty concentrating, remembering, or making decisions?: No Patient able to express need for assistance with ADLs?: Yes Does the patient have difficulty dressing or bathing?: Yes Independently performs ADLs?: No Communication: Independent Dressing (OT): Needs assistance Is this a change from baseline?: Pre-admission baseline Grooming: Independent Feeding: Independent Bathing: Needs assistance Is this a change from baseline?: Pre-admission baseline Toileting: Needs assistance Is this a change from baseline?: Pre-admission baseline In/Out Bed: Needs assistance Is this a change from baseline?: Pre-admission baseline Walks in Home: Needs assistance Is this a change from baseline?: Pre-admission baseline Does the patient have difficulty walking or climbing stairs?: Yes Weakness of Legs: Both Weakness of Arms/Hands: Both  Permission Sought/Granted Permission sought to share information with : Case Manager, Family Supports, Chartered certified accountant granted to share information with : Yes, Verbal Permission Granted  Share Information with NAME: Richardson Landry and Lennette Bihari  Permission granted to share info w AGENCY: SNF  Permission granted to share info w  Relationship: spouse and son  Permission granted to share info w Contact Information: Richardson Landry [phone] Miguel Dibble [phone]  Emotional Assessment    Attitude/Demeanor/Rapport: Gracious Affect (typically observed): Calm Orientation: : Oriented to Self, Oriented to Place, Oriented to  Time, Oriented to Situation (WDL) Alcohol / Substance Use: Not Applicable Psych Involvement: No (comment)  Admission diagnosis:  Weakness [R53.1] Physical deconditioning [R53.81] Community acquired pneumonia of right lower lobe of lung [J18.9] Patient Active Problem List   Diagnosis Date Noted   Physical deconditioning 11/04/2022   Atelectasis, bilateral 11/04/2022   Hypokalemia 11/04/2022   Hyponatremia 11/04/2022   Hypothyroidism 11/04/2022   Degenerative lumbar spinal stenosis 05/30/2022   Encounter to establish care 01/14/2022   Sensorineural hearing loss (SNHL) of both ears 01/14/2022   Bilateral impacted cerumen 01/14/2022   Hashimoto's thyroiditis 06/15/2021   History of vitamin D deficiency 06/15/2021   Frequent falls 05/17/2021   Age-related osteoporosis with current pathological fracture 02/06/2021   Compression fracture of body of thoracic vertebra ([place]) 02/06/2021   Primary osteoarthritis of right knee 10/23/2019   Neuralgia, postherpetic 07/02/2019   OAB (overactive bladder) 07/02/2019   SSS (sick sinus syndrome) ([place]) 02/11/2019   Constipation 01/18/2019   Status post placement of cardiac pacemaker 07/08/2018   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   Shellfish allergy 11/22/2015   Nonrheumatic mitral valve regurgitation 09/16/2015   Nontoxic multinodular goiter 09/08/2015   Current use of long term anticoagulation 05/16/2015   Recurrent UTI 03/14/2015   (HFpEF) heart failure with preserved ejection fraction ([place]) 09/13/2014   Pacemaker 11/09/2011   Impaired fasting glucose 05/23/2009   Synovial cyst of popliteal space 11/22/2008   Long term current use of systemic steroids 03/30/2008   Polymyalgia rheumatica ([place]) 03/30/2008   Benign essential hypertension 05/05/2007   Persistent atrial fibrillation ([place]) 11/06/2006   Pure  hypercholesterolemia 11/06/2006   Hypothyroidism due to Hashimoto's thyroiditis, Rx Levothyroxine 06/25/2005   PCP:  [name], MD Pharmacy:   [facility] [id number], [place] [address] Phone: [phone] Fax: [phone]  [facility] ([place], [place] [facility] [address] [place] [address] Phone: [phone] Fax: [phone]  [place], [place] [address] Phone: [phone] Fax: [phone]     Social Determinants of Health (SDOH) Social History: SDOH Screenings   Food Insecurity: No Food Insecurity (11/04/2022)  Housing: Low Risk  (11/04/2022)  Transportation Needs: No Transportation Needs (11/04/2022)  Utilities: Not At Risk (11/04/2022)  Depression (PHQ2-9): High Risk (11/02/2022)  Financial Resource Strain: Low Risk  (11/14/2021)  Physical Activity: Inactive (11/14/2021)  Social Connections: Moderately Integrated (11/14/2021)  Stress: Stress Concern Present (11/14/2021)  Tobacco Use: Medium Risk (11/04/2022)   SDOH Interventions:     Readmission Risk Interventions     No data to display

## 2022-11-04 NOTE — Evaluation (Signed)
Physical Therapy Evaluation Patient Details Name: Deanna Rivera MRN: [id number] DOB: August 11, 1933 Today's Date: 11/04/2022  History of Present Illness  Pt is an 89 y.o. female who presented 11/03/22 with constipation and general weakness. Head CT was negative for acute intracranial abnormality. PMH: heart block with pacer, permanent atrial fibrillation on Eliquis, hypothyroidism, inclusion body myositis, HTN, and recent influenza A infection   Clinical Impression  Pt presents with condition above and deficits mentioned below, see PT Problem List. PTA, she was residing with her husband in an ILF. A few months ago she could ambulate short distances with a rollator, but has progressively gotten weaker. Until she was infected with influenza, she was performing transfers to/from her transport chair mod I. Since having the flu, she has needed +1 to +3 assist for transfers. Currently, pt displays deficits in overall strength, balance, activity tolerance, endurance, and power. She is at high risk for falls, needing modA for bed mobility, transfers to stand, and stand pivot transfers at this time. Pt would benefit from short-term inpatient rehab, <3 hours/day. Will continue to follow acutely.       Recommendations for follow up therapy are one component of a multi-disciplinary discharge planning process, led by the attending physician.  Recommendations may be updated based on patient status, additional functional criteria and insurance authorization.  Follow Up Recommendations Skilled nursing-short term rehab (<3 hours/day) Can patient physically be transported by private vehicle: Yes    Assistance Recommended at Discharge Intermittent Supervision/Assistance  Patient can return home with the following  A lot of help with walking and/or transfers;A lot of help with bathing/dressing/bathroom;Assistance with cooking/housework;Assist for transportation    Equipment Recommendations Wheelchair (measurements  PT);Wheelchair cushion (measurements PT);Hospital bed;Rolling walker (2 wheels)  Recommendations for Other Services  OT consult    Functional Status Assessment Patient has had a recent decline in their functional status and demonstrates the ability to make significant improvements in function in a reasonable and predictable amount of time.     Precautions / Restrictions Precautions Precautions: Fall Restrictions Weight Bearing Restrictions: No      Mobility  Bed Mobility Overal bed mobility: Needs Assistance Bed Mobility: Rolling, Sidelying to Sit Rolling: Min assist, Mod assist Sidelying to sit: Mod assist, HOB elevated       General bed mobility comments: Min-modA to roll to L 2x, cuing pt with hand over hand guidance to grab L bed rail with UEs and flex R knee to push off bed with R foot. ModA to ascend trunk and scoot hips to EOB to sit up L EOB.    Transfers Overall transfer level: Needs assistance Equipment used: 1 person hand held assist Transfers: Sit to/from Stand, Bed to chair/wheelchair/BSC Sit to Stand: Mod assist Stand pivot transfers: Mod assist         General transfer comment: Pt holding onto therapist with anterior assist approach and guarding pt's bil knees as needed. ModA to shift weight anteriorly and power up to stand, 1x from EOB and 1x from recliner. ModA to direct hips and provide stability with stand pivot to L from bed to chair.    Ambulation/Gait               General Gait Details: deferred  Stairs            Wheelchair Mobility    Modified Rankin (Stroke Patients Only) Modified Rankin (Stroke Patients Only) Pre-Morbid Rankin Score: Moderately severe disability Modified Rankin: Severe disability     Balance Overall balance assessment:  Needs assistance Sitting-balance support: No upper extremity supported, Feet supported Sitting balance-Leahy Scale: Fair Sitting balance - Comments: Static sitting with minguard for safety    Standing balance support: Bilateral upper extremity supported, During functional activity Standing balance-Leahy Scale: Poor Standing balance comment: Reliant on UE support and modA                             Pertinent Vitals/Pain Pain Assessment Pain Assessment: Faces Faces Pain Scale: Hurts little more Pain Location: R shoulder, L arm Pain Descriptors / Indicators: Discomfort, Guarding Pain Intervention(s): Limited activity within patient's tolerance, Monitored during session, Repositioned    Home Living Family/patient expects to be discharged to:: Assisted living (ILF) Living Arrangements: Spouse/significant other Available Help at Discharge: Family;Available 24 hours/day (husband limited in amount of assist he can provide) Type of Home: Apartment Home Access: Elevator       Home Layout: One level Home Equipment: Rollator (4 wheels);BSC/3in1;Transport chair      Prior Function Prior Level of Function : Needs assist             Mobility Comments: Pt was able to perform transfers mod I to/from transport chair prior to recent flu infection, then has progressively needed increased assist of +1 to +3 for transfers. Pt was ambulating short distances with a rollator a few months ago ADLs Comments: needs assist     Hand Dominance        Extremity/Trunk Assessment   Upper Extremity Assessment Upper Extremity Assessment: Defer to OT evaluation    Lower Extremity Assessment Lower Extremity Assessment: Generalized weakness    Cervical / Trunk Assessment Cervical / Trunk Assessment: Kyphotic  Communication   Communication: No difficulties  Cognition Arousal/Alertness: Awake/alert Behavior During Therapy: WFL for tasks assessed/performed, Anxious Overall Cognitive Status: Within Functional Limits for tasks assessed                                 General Comments: Pt slow to process info, likely baseline though. Mildly anxious, reporting  fear of falling.        General Comments General comments (skin integrity, edema, etc.): VSS on RA    Exercises General Exercises - Lower Extremity Long Arc Quad: AROM, Strengthening, Both, 5 reps, Seated Other Exercises Other Exercises: educated pt on IS use 10x/hour, provided pt with IS Other Exercises: encouraged AROM of arms and legs to improve strength   Assessment/Plan    PT Assessment Patient needs continued PT services  PT Problem List Decreased strength;Decreased activity tolerance;Decreased balance;Decreased mobility       PT Treatment Interventions DME instruction;Gait training;Functional mobility training;Therapeutic activities;Therapeutic exercise;Balance training;Neuromuscular re-education;Patient/family education;Wheelchair mobility training    PT Goals (Current goals can be found in the Care Plan section)  Acute Rehab PT Goals Patient Stated Goal: to go to rehab PT Goal Formulation: With patient/family Time For Goal Achievement: 11/18/22 Potential to Achieve Goals: Fair    Frequency Min 2X/week     Co-evaluation               AM-PAC PT "6 Clicks" Mobility  Outcome Measure Help needed turning from your back to your side while in a flat bed without using bedrails?: A Lot Help needed moving from lying on your back to sitting on the side of a flat bed without using bedrails?: A Lot Help needed moving to and from a bed to  a chair (including a wheelchair)?: A Lot Help needed standing up from a chair using your arms (e.g., wheelchair or bedside chair)?: A Lot Help needed to walk in hospital room?: Total Help needed climbing 3-5 steps with a railing? : Total 6 Click Score: 10    End of Session Equipment Utilized During Treatment: Gait belt Activity Tolerance: Patient tolerated treatment well Patient left: in chair;with call bell/phone within reach;with chair alarm set Nurse Communication: Mobility status;Other (comment) (no grey cord connecting chair  alarm box to wall) PT Visit Diagnosis: Unsteadiness on feet (R26.81);Muscle weakness (generalized) (M62.81);Difficulty in walking, not elsewhere classified (R26.2)    Time: [id number] PT Time Calculation (min) (ACUTE ONLY): 40 min   Charges:   PT Evaluation $PT Eval Moderate Complexity: 1 Mod PT Treatments $Therapeutic Activity: 23-37 mins        Moishe Spice, PT, DPT Acute Rehabilitation Services  Office: [phone]   Orvan Falconer 11/04/2022, 10:35 AM

## 2022-11-04 NOTE — Progress Notes (Signed)
Patient arrived to the room with her eye glasses and top and bottom dentures in place. Husband is at bedside. PT denied pain, is alert and oriented x4. Patient was hooked up to 2L [place] on the wall source, O2 sat at 100%. BP 156/77. Bed alarm initiate, patient was oriented to staffs, room and call bell.  Deanna Rivera

## 2022-11-04 NOTE — ED Notes (Signed)
ED TO INPATIENT HANDOFF REPORT  ED Nurse Name and Phone #: Gennaro Africa RN [phone]  S Name/Age/Gender [name] 89 y.o. female Room/Bed: 030C/030C  Code Status   Code Status: Full Code  Home/SNF/Other Home Patient oriented to: self, place, time, and situation Is this baseline? Yes   Triage Complete: Triage complete  Chief Complaint Physical deconditioning [R53.81]  Triage Note No notes on file   Allergies Allergies  Allergen Reactions   Shellfish-Derived Products Shortness Of Breath and Other (See Comments)    Lips go numb   Epinephrine Other (See Comments) and Hypertension    "Makes me feel wired"   Monosodium Glutamate Other (See Comments)    other   Monosodium Glutamate Hypertension and Other (See Comments)    "Makes me feel badly"  Other Reaction(s): Other (See Comments)  other, other   Ciprofloxacin Other (See Comments)    Allergic reaction not recalled  Other Reaction(s): Other (See Comments)  Other reaction(s): Intolerance   Dronedarone     Other Reaction(s): Other (See Comments)  Leg swelling, Leg swelling   Erythromycin Other (See Comments)    Allergic reaction not recalled   Sulfa Antibiotics Other (See Comments)    Allergic reaction not recalled   Sulfamethoxazole-Trimethoprim    Tetracyclines & Related Other (See Comments)    Allergic reaction not recalled    Level of Care/Admitting Diagnosis ED Disposition     ED Disposition  Admit   Condition  --   [place]: [place] [id number]  Level of Care: Med-Surg [16]  May place patient in observation at [facility] or [place] if equivalent level of care is available:: Yes  Covid Evaluation: Asymptomatic - no recent exposure (last 10 days) testing not required  Diagnosis: Physical deconditioning [303050]  Admitting Physician: Vianne Bulls [id number]  Attending Physician: Vianne Bulls [id number]          B Medical/Surgery History Past Medical  History:  Diagnosis Date   Cataract    Hypertension    Moderate aortic stenosis    Moderate mitral regurgitation    Persistent atrial fibrillation (HCC)    PMR (polymyalgia rheumatica) (HCC)    Second degree AV block    Sick sinus syndrome ([place])    Thyroid disease    Past Surgical History:  Procedure Laterality Date   ABDOMINAL HYSTERECTOMY     APPENDECTOMY     CESAREAN SECTION     x 3   CHOLECYSTECTOMY     PACEMAKER GENERATOR CHANGE  11/20/2018   [employer] Accolade MRI PPM implanted in [place]       A IV Location/Drains/Wounds Patient Lines/Drains/Airways Status     Active Line/Drains/Airways     Name Placement date Placement time Site Days   Peripheral IV 11/04/22 20 G Anterior;Right Wrist 11/04/22  0019  Wrist  less than 1   Peripheral IV 11/04/22 20 G Anterior;Left;Proximal Forearm 11/04/22  0000  Forearm  less than 1            Intake/Output Last 24 hours  Intake/Output Summary (Last 24 hours) at 11/04/2022 0427 Last data filed at 11/04/2022 0304 Gross per 24 hour  Intake 1194.62 ml  Output --  Net 1194.62 ml    Labs/Imaging Results for orders placed or performed during the hospital encounter of 11/03/22 (from the past 48 hour(s))  CBC     Status: Abnormal   Collection Time:  11/03/22 11:50 PM  Result Value Ref Range   WBC 8.2 4.0 - 10.5 K/uL   RBC 4.78 3.87 - 5.11 MIL/uL   Hemoglobin 15.1 (H) 12.0 - 15.0 g/dL   HCT 43.7 36.0 - 46.0 %   MCV 91.4 80.0 - 100.0 fL   MCH 31.6 26.0 - 34.0 pg   MCHC 34.6 30.0 - 36.0 g/dL   RDW 12.6 11.5 - 15.5 %   Platelets 207 150 - 400 K/uL   nRBC 0.0 0.0 - 0.2 %    Comment: Performed at [facility], [place] [address], [place], [place] [address]  Comprehensive metabolic panel     Status: Abnormal   Collection Time: 11/03/22 11:50 PM  Result Value Ref Range   Sodium 132 (L) 135 - 145 mmol/L   Potassium 3.1 (L) 3.5 - 5.1 mmol/L   Chloride 91 (L) 98 - 111  mmol/L   CO2 26 22 - 32 mmol/L   Glucose, Bld 122 (H) 70 - 99 mg/dL    Comment: Glucose reference range applies only to samples taken after fasting for at least 8 hours.   BUN 13 8 - 23 mg/dL   Creatinine, Ser 0.34 (L) 0.44 - 1.00 mg/dL   Calcium 9.5 8.9 - 10.3 mg/dL   Total Protein 7.1 6.5 - 8.1 g/dL   Albumin 2.9 (L) 3.5 - 5.0 g/dL   AST 33 15 - 41 U/L   ALT 26 0 - 44 U/L   Alkaline Phosphatase 46 38 - 126 U/L   Total Bilirubin 0.9 0.3 - 1.2 mg/dL   GFR, Estimated >60 >60 mL/min    Comment: (NOTE) Calculated using the CKD-EPI Creatinine Equation (2021)    Anion gap 15 5 - 15    Comment: Performed at [facility] [address] [place], [place] [address]  Lipase, blood     Status: None   Collection Time: 11/03/22 11:50 PM  Result Value Ref Range   Lipase 42 11 - 51 U/L    Comment: Performed at [facility] [address], [place], [place] [address]  Troponin I (High Sensitivity)     Status: Abnormal   Collection Time: 11/03/22 11:50 PM  Result Value Ref Range   Troponin I (High Sensitivity) 21 (H) <18 ng/L    Comment: (NOTE) Elevated high sensitivity troponin I (hsTnI) values and significant  changes across serial measurements may suggest ACS but many other  chronic and acute conditions are known to elevate hsTnI results.  Refer to the "Links" section for chest pain algorithms and additional  guidance. Performed at [facility], [place] [address] [place], [place] [address]   Lactic acid, plasma     Status: None   Collection Time: 11/03/22 11:50 PM  Result Value Ref Range   Lactic Acid, Venous 1.3 0.5 - 1.9 mmol/L    Comment: Performed at [facility] [address], [place], [place] [address]  Digoxin level     Status: None   Collection Time: 11/03/22 11:50 PM  Result Value Ref Range   Digoxin Level 1.3 0.8 - 2.0 ng/mL    Comment: Performed at [facility] [address] [place], [place] [address]  Brain natriuretic peptide     Status:  Abnormal   Collection Time: 11/04/22 12:13 AM  Result Value Ref Range   B Natriuretic Peptide 123.8 (H) 0.0 - 100.0 pg/mL    Comment: Performed at [facility], [address] [place], [place] [address]  I-stat chem 8, ED     Status: Abnormal   Collection Time: 11/04/22 12:19 AM  Result Value Ref Range   Sodium 130 (L) 135 - 145 mmol/L   Potassium 6.1 (H) 3.5 - 5.1 mmol/L   Chloride 94 (L) 98 - 111 mmol/L   BUN 18 8 - 23 mg/dL   Creatinine, Ser 0.20 (L) 0.44 - 1.00 mg/dL   Glucose, Bld 122 (H) 70 - 99 mg/dL    Comment: Glucose reference range applies only to samples taken after fasting for at least 8 hours.   Calcium, Ion 1.06 (L) 1.15 - 1.40 mmol/L   TCO2 33 (H) 22 - 32 mmol/L   Hemoglobin 15.0 12.0 - 15.0 g/dL   HCT 44.0 36.0 - 46.0 %  Lactic acid, plasma     Status: None   Collection Time: 11/04/22  1:04 AM  Result Value Ref Range   Lactic Acid, Venous 1.6 0.5 - 1.9 mmol/L    Comment: Performed at [facility] [address] [place], [place] [address]  Troponin I (High Sensitivity)     Status: Abnormal   Collection Time: 11/04/22  1:04 AM  Result Value Ref Range   Troponin I (High Sensitivity) 20 (H) <18 ng/L    Comment: (NOTE) Elevated high sensitivity troponin I (hsTnI) values and significant  changes across serial measurements may suggest ACS but many other  chronic and acute conditions are known to elevate hsTnI results.  Refer to the "Links" section for chest pain algorithms and additional  guidance. Performed at [facility], [place] [address] [place], [place] [address]    CT ABDOMEN PELVIS W CONTRAST  Result Date: 11/04/2022 CLINICAL DATA:  Abdominal pain EXAM: CT ABDOMEN AND PELVIS WITH CONTRAST TECHNIQUE: Multidetector CT imaging of the abdomen and pelvis was performed using the standard protocol following bolus administration of intravenous contrast. RADIATION DOSE REDUCTION: This exam was performed according to the departmental  dose-optimization program which includes automated exposure control, adjustment of the mA and/or kV according to patient size and/or use of iterative reconstruction technique. CONTRAST:  53mL OMNIPAQUE IOHEXOL 350 MG/ML SOLN COMPARISON:  03/08/2020 FINDINGS: Lower chest: Small right-sided pleural effusion is noted. Atelectatic changes are noted in the right middle and lower lobes as well as in the left lower lobe. Cardiac shadow is enlarged. Hepatobiliary: No focal liver abnormality is seen. Status post cholecystectomy. No biliary dilatation. Pancreas: Unremarkable. No pancreatic ductal dilatation or surrounding inflammatory changes. Spleen: Normal in size without focal abnormality. Adrenals/Urinary Tract: Adrenal glands are within normal limits. Kidneys demonstrate a normal enhancement pattern. Normal excretion is noted bilaterally. The bladder is well distended. Stomach/Bowel: Diverticular disease of the colon is noted. No evidence of diverticulitis is seen. The appendix has been surgically removed. Small bowel and stomach are within normal limits. Vascular/Lymphatic: Aortic atherosclerosis. No enlarged abdominal or pelvic lymph nodes. Reproductive: Status post hysterectomy. No adnexal masses. Other: No abdominal wall hernia or abnormality. No abdominopelvic ascites. Musculoskeletal: Mild degenerative changes of lumbar spine are noted. IMPRESSION: Diverticulosis without diverticulitis. Bilateral atelectatic changes as described with associated right-sided pleural effusion. Electronically Signed   By: Inez Catalina M.D.   On: 11/04/2022 02:17   CT Head Wo Contrast  Result Date: 11/04/2022 CLINICAL DATA:  Altered mental status EXAM: CT HEAD WITHOUT CONTRAST TECHNIQUE: Contiguous axial images were obtained from the base of the skull through the vertex without intravenous contrast. RADIATION DOSE REDUCTION: This exam was performed  according to the departmental dose-optimization program which includes automated  exposure control, adjustment of the mA and/or kV according to patient size and/or use of iterative reconstruction technique. COMPARISON:  09/04/2021 FINDINGS: Brain: No evidence of acute infarction, hemorrhage, hydrocephalus, extra-axial collection or mass lesion/mass effect. Small lacunar infarct is noted in the left basal ganglia. Vascular: No hyperdense vessel or unexpected calcification. Skull: Normal. Negative for fracture or focal lesion. Sinuses/Orbits: No acute finding. Other: None. IMPRESSION: No acute intracranial abnormality noted. Chronic ischemic changes are noted. Electronically Signed   By: Inez Catalina M.D.   On: 11/04/2022 02:14   DG Chest 2 View  Result Date: 11/03/2022 CLINICAL DATA:  Shortness of breath and hypoxia, initial encounter EXAM: CHEST - 2 VIEW COMPARISON:  10/29/2022 FINDINGS: Cardiac shadow is enlarged but stable. Pacing device is again seen. Increasing right basilar infiltrate is noted. No effusion or pneumothorax is seen. IMPRESSION: Increasing right lower lobe and right middle lobe infiltrate. Electronically Signed   By: Inez Catalina M.D.   On: 11/03/2022 23:38    Pending Labs Unresulted Labs (From admission, onward)     Start     Ordered   11/04/22 [id number]  Basic metabolic panel  Daily,   R      11/04/22 0412   11/04/22 0500  CBC  Daily,   R      11/04/22 0412   11/04/22 0410  Magnesium  Once,   R        11/04/22 0412   11/04/22 0410  Phosphorus  Once,   R        11/04/22 0412   11/04/22 0409  C-reactive protein  Once,   R        11/04/22 0412   11/04/22 0409  Sedimentation rate  Once,   R        11/04/22 0412   11/04/22 0409  CK  Once,   R        11/04/22 0412   11/03/22 2249  Blood culture (routine x 2)  BLOOD CULTURE X 2,   R      11/03/22 2249            Vitals/Pain Today's Vitals   11/04/22 0100 11/04/22 0115 11/04/22 0200 11/04/22 0300  BP: 126/63 138/61 (!) 155/67 105/61  Pulse: 60 64 68 62  Resp: 19 17 17 20   Temp:   97.9 F (36.6 C)    TempSrc:   Oral   SpO2: 99% 98% 99% 99%  Weight:      Height:        Isolation Precautions No active isolations  Medications Medications  digoxin (LANOXIN) tablet 125 mcg (has no administration in time range)  metoprolol succinate (TOPROL-XL) 24 hr tablet 50 mg (has no administration in time range)  levothyroxine (SYNTHROID) tablet 112 mcg (has no administration in time range)  predniSONE (DELTASONE) tablet 1 mg (has no administration in time range)  apixaban (ELIQUIS) tablet 2.5 mg (has no administration in time range)  albuterol (VENTOLIN HFA) 108 (90 Base) MCG/ACT inhaler 2 puff (has no administration in time range)  acetaminophen (TYLENOL) tablet 650 mg (has no administration in time range)    Or  acetaminophen (TYLENOL) suppository 650 mg (has no administration in time range)  polyethylene glycol (MIRALAX / GLYCOLAX) packet 17 g (has no administration in time range)  bisacodyl (DULCOLAX) EC tablet 5 mg (has no administration in time range)  potassium chloride (KLOR-CON) packet 20 mEq (has no administration in time range)  lactated ringers bolus 1,000 mL (0 mLs Intravenous Stopped 11/04/22 0220)  cefTRIAXone (ROCEPHIN) 1 g in sodium chloride 0.9 % 100 mL IVPB (0 g Intravenous Stopped 11/04/22 0051)  potassium chloride 10 mEq in 100 mL IVPB (0 mEq Intravenous Stopped 11/04/22 0304)  iohexol (OMNIPAQUE) 350 MG/ML injection 75 mL (75 mLs Intravenous Contrast Given 11/04/22 0201)    Mobility non-ambulatory     Focused Assessments Cardiac Assessment Handoff:  Cardiac Rhythm: Ventricular paced Lab Results  Component Value Date   CKTOTAL 121 10/29/2022   No results found for: "DDIMER" Does the Patient currently have chest pain? No   , Neuro Assessment Handoff:  Swallow screen pass? Yes  Cardiac Rhythm: Ventricular paced NIH Stroke Scale  Dizziness Present: No Headache Present: No Interval: Initial Level of Consciousness (1a.)   : Alert, keenly responsive LOC Questions  (1b. )   : Answers both questions correctly LOC Commands (1c. )   : Performs both tasks correctly Best Gaze (2. )  : Normal Visual (3. )  : No visual loss Facial Palsy (4. )    : Minor paralysis Motor Arm, Left (5a. )   : No drift Motor Arm, Right (5b. ) : No drift Motor Leg, Left (6a. )  : No drift Motor Leg, Right (6b. ) : No drift Limb Ataxia (7. ): Absent Sensory (8. )  : Normal, no sensory loss Best Language (9. )  : No aphasia Dysarthria (10. ): Normal Extinction/Inattention (11.)   : No Abnormality Complete NIHSS TOTAL: 1 Last date known well: 11/03/22 Last time known well:  (unknown) Neuro Assessment:   Neuro Checks:   Initial (11/03/22 2159)  Has TPA been given? No If patient is a Neuro Trauma and patient is going to OR before floor call report to [facility] nurse: [phone] or [phone]  , Pulmonary Assessment Handoff:  Lung sounds: Bilateral Breath Sounds: Rhonchi O2 Device: Nasal Cannula O2 Flow Rate (L/min): 2 L/min    R Recommendations: See Admitting Provider Note  Report given to:   Additional Notes:

## 2022-11-04 NOTE — NC FL2 (Signed)
[place] MEDICAID FL2 LEVEL OF CARE FORM     IDENTIFICATION  Patient Name: Deanna Rivera Birthdate: 02/09/1933 Sex: female Admission Date (Current Location): 11/03/2022  [facility] and [place] Number:  Herbalist and Address:  [facility], [place] [address] [place], [place] [address]      Provider Number: [id number]  Attending Physician Name and Address:  Kerney Elbe, DO  Relative Name and Phone Number:  Richardson Landry (spouse) [phone]    Current Level of Care: Hospital Recommended Level of Care: [place] Prior Approval Number:    Date Approved/Denied:   PASRR Number: [id number]  Discharge Plan: SNF    Current Diagnoses: Patient Active Problem List   Diagnosis Date Noted   Physical deconditioning 11/04/2022   Atelectasis, bilateral 11/04/2022   Hypokalemia 11/04/2022   Hyponatremia 11/04/2022   Hypothyroidism 11/04/2022   Degenerative lumbar spinal stenosis 05/30/2022   Encounter to establish care 01/14/2022   Sensorineural hearing loss (SNHL) of both ears 01/14/2022   Bilateral impacted cerumen 01/14/2022   Hashimoto's thyroiditis 06/15/2021   History of vitamin D deficiency 06/15/2021   Frequent falls 05/17/2021   Age-related osteoporosis with current pathological fracture 02/06/2021   Compression fracture of body of thoracic vertebra ([place]) 02/06/2021   Primary osteoarthritis of right knee 10/23/2019   Neuralgia, postherpetic 07/02/2019   OAB (overactive bladder) 07/02/2019   SSS (sick sinus syndrome) ([place]) 02/11/2019   Constipation 01/18/2019   Status post placement of cardiac pacemaker 07/08/2018   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   Shellfish allergy 11/22/2015   Nonrheumatic mitral valve regurgitation 09/16/2015   Nontoxic multinodular goiter 09/08/2015   Current use of long term anticoagulation 05/16/2015   Recurrent UTI 03/14/2015   (HFpEF) heart failure with preserved ejection fraction ([place])  09/13/2014   Pacemaker 11/09/2011   Impaired fasting glucose 05/23/2009   Synovial cyst of popliteal space 11/22/2008   Long term current use of systemic steroids 03/30/2008   Polymyalgia rheumatica ([place]) 03/30/2008   Benign essential hypertension 05/05/2007   Persistent atrial fibrillation ([place]) 11/06/2006   Pure hypercholesterolemia 11/06/2006   Hypothyroidism due to Hashimoto's thyroiditis, Rx Levothyroxine 06/25/2005    Orientation RESPIRATION BLADDER Height & Weight     Self, Time, Situation, Place  O2 (Nasal Cannula 2 liters) Incontinent Weight: 125 lb (56.7 kg) Height:  5\' 4"  (162.6 cm)  BEHAVIORAL SYMPTOMS/MOOD NEUROLOGICAL BOWEL NUTRITION STATUS      Continent (WDL) Diet (Please see discharge summary)  AMBULATORY STATUS COMMUNICATION OF NEEDS Skin   Extensive Assist Verbally Other (Comment) (Appropriate for ethnicity,Dry,Wound/Incision LDAs)                       Personal Care Assistance Level of Assistance  Bathing, Feeding, Dressing Bathing Assistance: Maximum assistance Feeding assistance: Limited assistance (Assist with set up, Patient can feed self) Dressing Assistance: Maximum assistance     Functional Limitations Info  Sight, Hearing, Speech Sight Info:  Administrator, sports) Hearing Info: Impaired Speech Info: Adequate    SPECIAL CARE FACTORS FREQUENCY  PT (By licensed PT), OT (By licensed OT)     PT Frequency: 5x min weekly OT Frequency: 5x min weekly            Contractures Contractures Info: Not present    Additional Factors Info  Code Status, Allergies Code Status Info: FULL Allergies Info: Shellfish-derived Products,Epinephrine,Monosodium Glutamate,Monosodium Glutamate other,Ciprofloxacin,Dronedarone,Erythromycin,Sulfa Antibiotics,Sulfamethoxazole-trimethoprim,Tetracyclines & Related           Current Medications (11/04/2022):  This  is the current hospital active medication list Current Facility-Administered Medications  Medication Dose  Route Frequency Provider Last Rate Last Admin   0.9 %  sodium chloride infusion   Intravenous Continuous Raiford Noble [place], DO 75 mL/hr at 11/04/22 1300 Infusion Verify at 11/04/22 1300   acetaminophen (TYLENOL) tablet 650 mg  650 mg Oral Q6H PRN Opyd, Ilene Qua, MD       Or   acetaminophen (TYLENOL) suppository 650 mg  650 mg Rectal Q6H PRN Opyd, Ilene Qua, MD       albuterol (PROVENTIL) (2.5 MG/3ML) 0.083% nebulizer solution 3 mL  3 mL Inhalation Q6H PRN Opyd, Ilene Qua, MD       apixaban (ELIQUIS) tablet 2.5 mg  2.5 mg Oral BID Opyd, Timothy S, MD   2.5 mg at 11/04/22 0920   bisacodyl (DULCOLAX) EC tablet 5 mg  5 mg Oral Daily Raiford Noble [place], DO   5 mg at 11/04/22 1201   digoxin (LANOXIN) tablet 125 mcg  125 mcg Oral Daily Opyd, Ilene Qua, MD   125 mcg at 11/04/22 0920   guaiFENesin (ROBITUSSIN) 100 MG/5ML liquid 5 mL  5 mL Oral Q4H PRN Raiford Noble Latif, DO   5 mL at 11/04/22 1256   levothyroxine (SYNTHROID) tablet 112 mcg  112 mcg Oral QAC breakfast Vianne Bulls, MD   112 mcg at 11/04/22 0631   metoprolol succinate (TOPROL-XL) 24 hr tablet 50 mg  50 mg Oral Daily Opyd, Ilene Qua, MD   50 mg at 11/04/22 0920   Oral care mouth rinse  15 mL Mouth Rinse 4 times per day Raiford Noble Latif, DO   15 mL at 11/04/22 1035   Oral care mouth rinse  15 mL Mouth Rinse PRN Sheikh, Omair Latif, DO       polyethylene glycol (MIRALAX / GLYCOLAX) packet 17 g  17 g Oral Daily Raiford Noble [place], DO   17 g at 11/04/22 1201   potassium chloride 10 mEq in 100 mL IVPB  10 mEq Intravenous Q1 Hr x 3 Sheikh, Omair [place], DO 100 mL/hr at 11/04/22 1302 10 mEq at 11/04/22 1302   predniSONE (DELTASONE) tablet 1 mg  1 mg Oral Daily Opyd, Ilene Qua, MD         Discharge Medications: Please see discharge summary for a list of discharge medications.  Relevant Imaging Results:  Relevant Lab Results:   Additional Information [phone]  Milas Gain, LCSWA

## 2022-11-04 NOTE — Evaluation (Signed)
Clinical/Bedside Swallow Evaluation Patient Details  Name: Deanna Rivera MRN: [id number] Date of Birth: [birth year]  Today's Date: 11/04/2022 Time: SLP Start Time (ACUTE ONLY): 2 SLP Stop Time (ACUTE ONLY): 1450 SLP Time Calculation (min) (ACUTE ONLY): 15 min  Past Medical History:  Past Medical History:  Diagnosis Date   Cataract    Hypertension    Moderate aortic stenosis    Moderate mitral regurgitation    Persistent atrial fibrillation (HCC)    PMR (polymyalgia rheumatica) (HCC)    Second degree AV block    Sick sinus syndrome ([place])    Thyroid disease    Past Surgical History:  Past Surgical History:  Procedure Laterality Date   ABDOMINAL HYSTERECTOMY     APPENDECTOMY     CESAREAN SECTION     x 3   CHOLECYSTECTOMY     PACEMAKER GENERATOR CHANGE  11/20/2018   [employer] Accolade MRI PPM implanted in [place]     HPI:  Patient is an 89 y.o female with PMH: HTN, a-fib, hypothyroidism, inclusion body myositis, recent influenza infection. She presented to the hospital on 11/03/22 with generalized weakness and constipation. CT was negative for acute intracranial abnormality. Of note, a modified barium swallow study was recommended in the past but patient did not get this done. During an OP appointment with her neurologist, he recommended she proceed with this swallow test.    Assessment / Plan / Recommendation  Clinical Impression  Patient is presenting with clinical s/s of dysphagia as per this bedside swallow. She did not exhibit any overt s/s aspiration or penetration but did require multiple swallows with thin liquids (water) and patient reporting that "I make sounds when I swallow". Per patient report, she has had dysphagia for some time. She recalls her neurologist urging her to follow through with getting MBS swallow test but patient said she was hesitant because she wasn't sure if it would help anything. Her son  added that patient is fearful/apprehensive about tests/procedures that would potentially lead to a more invasive, surgical intervention. SLP explained MBS test and assured patient that it was not invasive and the results would be to help understand her swallow function, aspiration risk and determine if there were any strategies to help. Patient in agreement. SLP recommending proceed with MBS. SLP Visit Diagnosis: Dysphagia, unspecified (R13.10)    Aspiration Risk  Mild aspiration risk    Diet Recommendation Regular;Thin liquid   Liquid Administration via: Cup;Straw Medication Administration: Whole meds with liquid Supervision: Patient able to self feed Compensations: Slow rate;Small sips/bites Postural Changes: Seated upright at 90 degrees;Remain upright for at least 30 minutes after po intake    Other  Recommendations Oral Care Recommendations: Oral care BID    Recommendations for follow up therapy are one component of a multi-disciplinary discharge planning process, led by the attending physician.  Recommendations may be updated based on patient status, additional functional criteria and insurance authorization.  Follow up Recommendations Follow physician's recommendations for discharge plan and follow up therapies      Assistance Recommended at Discharge    Functional Status Assessment Patient has had a recent decline in their functional status and demonstrates the ability to make significant improvements in function in a reasonable and predictable amount of time.  Frequency and Duration min 2x/week  2 weeks       Prognosis Prognosis for improved oropharyngeal function: Good      Swallow Study   General  Date of Onset: 11/04/22 HPI: Patient is an 89 y.o female with PMH: HTN, a-fib, hypothyroidism, inclusion body myositis, recent influenza infection. She presented to the hospital on 11/03/22 with generalized weakness and constipation. CT was negative for acute intracranial  abnormality. Of note, a modified barium swallow study was recommended in the past but patient did not get this done. During an OP appointment with her neurologist, he recommended she proceed with this swallow test. Type of Study: Bedside Swallow Evaluation Previous Swallow Assessment: none found Diet Prior to this Study: Regular;Thin liquids (Level 0) Temperature Spikes Noted: No Respiratory Status: Room air History of Recent Intubation: No Behavior/Cognition: Alert;Cooperative;Pleasant mood Oral Cavity Assessment: Within Functional Limits Oral Care Completed by SLP: No Oral Cavity - Dentition: Adequate natural dentition Vision: Functional for self-feeding Self-Feeding Abilities: Able to feed self Patient Positioning: Upright in bed Baseline Vocal Quality: Normal Volitional Cough: Weak Volitional Swallow: Able to elicit    Oral/Motor/Sensory Function Overall Oral Motor/Sensory Function: Within functional limits   Ice Chips     Thin Liquid Thin Liquid: Impaired Pharyngeal  Phase Impairments: Multiple swallows    Nectar Thick Nectar Thick Liquid: Not tested   Honey Thick Honey Thick Liquid: Not tested   Puree Puree: Not tested   Solid     Solid: Not tested      Sonia Baller, MA, CCC-SLP Speech Therapy

## 2022-11-04 NOTE — H&P (Addendum)
History and Physical    Deanna Rivera [id number] DOB: [birth year] DOA: 11/03/2022  PCP: [name], MD   Patient coming from: ILF  Chief Complaint: Constipation, weakness   HPI: Ramiro Kilbourne is an 89 y.o. female with medical history significant for heart block with pacer, permanent atrial fibrillation on Eliquis, hypothyroidism, inclusion body myositis, hypertension, and recent influenza A infection who presents to the emergency department for evaluation of constipation and general weakness.  Patient developed cough, fatigue, nausea, vomiting, and loss of appetite around 10/26/2022 and was diagnosed with influenza A.  While her nausea, vomiting, and loss of appetite has resolved and her cough is improving, she has been suffering increased generalized weakness to the point where she is unable to get out of bed for the last few days.  She has been nonambulatory prior to the recent illness.  She has a history of muscle weakness going back at least to the 1970s, was diagnosed with inclusion body myositis in [place] remotely and saw a local rheumatologist in 2022 who appeared to agree with the diagnosis.  She has had progressive weakness over the years attributed to this.  ED Course: Upon arrival to the ED, patient is found to be afebrile, saturating low to mid 90s on room air, slightly tachypneic, and with normal heart rate and blood pressure.  EKG demonstrates atrial fibrillation.  Head CT was negative for acute intracranial abnormality.  CT of the abdomen and pelvis is negative for acute intra-abdominal or pelvic abnormality but notable for small right pleural effusion and atelectasis involving the right middle, right lower, and left lower lobes.  Labs were most notable for potassium 3.1, albumin 2.9, normal WBC, and normal lactic acid.   Blood culture was collected in the ED and the patient was given 1 L of LR, 10 mill equivalents IV potassium, and 1 g IV Rocephin.  She was placed on 2  L/min of supplemental oxygen.  Review of Systems:  All other systems reviewed and apart from HPI, are negative.  Past Medical History:  Diagnosis Date   Cataract    Hypertension    Moderate aortic stenosis    Moderate mitral regurgitation    Persistent atrial fibrillation (HCC)    PMR (polymyalgia rheumatica) (HCC)    Second degree AV block    Sick sinus syndrome ([place])    Thyroid disease     Past Surgical History:  Procedure Laterality Date   ABDOMINAL HYSTERECTOMY     APPENDECTOMY     CESAREAN SECTION     x 3   CHOLECYSTECTOMY     PACEMAKER GENERATOR CHANGE  11/20/2018   [employer] Accolade MRI PPM implanted in [place] CYST EXCISION      Social History:   reports that she has quit smoking. Her smoking use included cigarettes. She has never used smokeless tobacco. She reports that she does not currently use alcohol. She reports that she does not currently use drugs.  Allergies  Allergen Reactions   Shellfish-Derived Products Shortness Of Breath and Other (See Comments)    Lips go numb   Epinephrine Other (See Comments) and Hypertension    "Makes me feel wired"   Monosodium Glutamate Other (See Comments)    other   Monosodium Glutamate Hypertension and Other (See Comments)    "Makes me feel badly"  Other Reaction(s): Other (See Comments)  other, other   Ciprofloxacin Other (See Comments)    Allergic reaction not recalled  Other Reaction(s): Other (See Comments)  Other reaction(s): Intolerance   Dronedarone     Other Reaction(s): Other (See Comments)  Leg swelling, Leg swelling   Erythromycin Other (See Comments)    Allergic reaction not recalled   Sulfa Antibiotics Other (See Comments)    Allergic reaction not recalled   Sulfamethoxazole-Trimethoprim    Tetracyclines & Related Other (See Comments)    Allergic reaction not recalled    Family History  Problem Relation Age of Onset   Early death Mother 71    Hypertension Brother    Cirrhosis Father 53   Lung cancer Brother 45   Scleroderma Daughter      Prior to Admission medications   Medication Sig Start Date End Date Taking? Authorizing Provider  acetaminophen (TYLENOL) 325 MG tablet Take 2 tablets (650 mg total) by mouth every 6 (six) hours as needed. 10/29/22   Jeanell Sparrow, DO  acetaminophen (TYLENOL) 500 MG tablet Take 500 mg by mouth every 6 (six) hours as needed for mild pain or headache.    [provider]  albuterol (VENTOLIN HFA) 108 (90 Base) MCG/ACT inhaler Inhale 2 puffs into the lungs every 6 (six) hours as needed for wheezing or shortness of breath. 11/02/22   Tawnya Crook, MD  apixaban Arne [place]) 2.5 MG TABS tablet TAKE 1 TABLET BY MOUTH TWICE  DAILY 10/15/22   Evans Lance, MD  cefdinir (OMNICEF) 300 MG capsule Take 1 capsule (300 mg total) by mouth 2 (two) times daily. 11/02/22   Tawnya Crook, MD  Cholecalciferol (VITAMIN D3) 25 MCG (1000 UT) CAPS Take 1 capsule by mouth daily.    [provider]  digoxin (LANOXIN) 0.125 MG tablet TAKE 1 TABLET BY MOUTH DAILY 07/09/22   Evans Lance, MD  dorzolamide-timolol (COSOPT) 22.3-6.8 MG/ML ophthalmic solution Place 1 drop into both eyes 2 (two) times daily. 03/30/21   [provider]  guaiFENesin-dextromethorphan (ROBITUSSIN DM) 100-10 MG/5ML syrup Take 5 mLs by mouth every 4 (four) hours as needed for cough. Patient not taking: Reported on 11/02/2022 10/29/22   Wynona Dove A, DO  hydrochlorothiazide (HYDRODIURIL) 25 MG tablet TAKE 1 TABLET BY MOUTH DAILY 10/29/22   [name], MD  latanoprost (XALATAN) 0.005 % ophthalmic solution Place 1 drop into both eyes at bedtime. 03/13/21   [provider]  levothyroxine (SYNTHROID) 112 MCG tablet TAKE 1 TABLET BY MOUTH DAILY  BEFORE BREAKFAST 08/08/22   [name], MD  metoprolol succinate (TOPROL-XL) 50 MG 24 hr tablet TAKE 1 TABLET BY MOUTH DAILY 06/07/22   [name], MD  ondansetron  (ZOFRAN) 4 MG tablet Take 1 tablet (4 mg total) by mouth every 8 (eight) hours as needed for nausea or vomiting. 10/29/22   Wynona Dove A, DO  potassium chloride (KLOR-CON 10) 10 MEQ tablet Take 1 tablet (10 mEq total) by mouth daily. 11/02/22   Tawnya Crook, MD  predniSONE (DELTASONE) 1 MG tablet TAKE 1 TABLET BY MOUTH DAILY 08/08/22   [name], MD    Physical Exam: Vitals:   11/04/22 0100 11/04/22 0115 11/04/22 0200 11/04/22 0300  BP: 126/63 138/61 (!) 155/67 105/61  Pulse: 60 64 68 62  Resp: 19 17 17 20   Temp:   97.9 F (36.6 C)   TempSrc:   Oral   SpO2: 99% 98% 99% 99%  Weight:      Height:         Constitutional: NAD, no pallor or diaphoresis  Eyes: PERTLA, lids and conjunctivae normal ENMT: Mucous membranes are moist. Posterior pharynx clear of any exudate or lesions.   Neck: supple, no masses  Respiratory: no wheezing, no crackles. No accessory muscle use.  Cardiovascular: S1 & S2 heard, regular rate and rhythm. Lower leg swelling b/l.   Abdomen: No distension, soft, no rebound pain or guarding. Bowel sounds active.  Musculoskeletal: no clubbing / cyanosis. No joint deformity upper and lower extremities.   Skin: no significant rashes, lesions, ulcers. Warm, dry, well-perfused. Neurologic: Gross hearing deficit, CN 2-12 grossly intact otherwise. Sensation intact. Moving all extremities but globally weak. Alert and oriented to person, place, and situation.  Psychiatric: Pleasant. Cooperative.    Labs and Imaging on Admission: I have personally reviewed following labs and imaging studies  CBC: Recent Labs  Lab 10/29/22 0957 11/03/22 2350 11/04/22 0019  WBC 6.1 8.2  --   NEUTROABS 4.5  --   --   HGB 13.9 15.1* 15.0  HCT 41.9 43.7 44.0  MCV 97.0 91.4  --   PLT 179 207  --    Basic Metabolic Panel: Recent Labs  Lab 10/29/22 0957 11/03/22 2350 11/04/22 0019  NA 133* 132* 130*  K 3.0* 3.1* 6.1*  CL 96* 91* 94*  CO2 27 26  --   GLUCOSE 84 122* 122*   BUN 20 13 18   CREATININE 0.38* 0.34* 0.20*  CALCIUM 9.4 9.5  --    GFR: Estimated Creatinine Clearance: 42.2 mL/min (A) (by C-G formula based on SCr of 0.2 mg/dL (L)). Liver Function Tests: Recent Labs  Lab 10/29/22 0957 11/03/22 2350  AST 43* 33  ALT 31 26  ALKPHOS 47 46  BILITOT 0.9 0.9  PROT 8.0 7.1  ALBUMIN 3.5 2.9*   Recent Labs  Lab 11/03/22 2350  LIPASE 42   No results for input(s): "AMMONIA" in the last 168 hours. Coagulation Profile: No results for input(s): "INR", "PROTIME" in the last 168 hours. Cardiac Enzymes: Recent Labs  Lab 10/29/22 0957  CKTOTAL 121   BNP (last 3 results) No results for input(s): "PROBNP" in the last 8760 hours. HbA1C: No results for input(s): "HGBA1C" in the last 72 hours. CBG: No results for input(s): "GLUCAP" in the last 168 hours. Lipid Profile: No results for input(s): "CHOL", "HDL", "LDLCALC", "TRIG", "CHOLHDL", "LDLDIRECT" in the last 72 hours. Thyroid Function Tests: No results for input(s): "TSH", "T4TOTAL", "FREET4", "T3FREE", "THYROIDAB" in the last 72 hours. Anemia Panel: No results for input(s): "VITAMINB12", "FOLATE", "FERRITIN", "TIBC", "IRON", "RETICCTPCT" in the last 72 hours. Urine analysis:    Component Value Date/Time   COLORURINE YELLOW 09/27/2021 1609   APPEARANCEUR Sl Cloudy (A) 09/27/2021 1609   LABSPEC 1.025 09/27/2021 1609   PHURINE 5.5 09/27/2021 1609   GLUCOSEU NEGATIVE 09/27/2021 1609   HGBUR NEGATIVE 09/27/2021 1609   BILIRUBINUR negative 08/29/2022 1533   KETONESUR NEGATIVE 09/27/2021 1609   PROTEINUR Negative 08/29/2022 1533   PROTEINUR NEGATIVE 05/12/2021 1406   UROBILINOGEN 0.2 08/29/2022 1533   UROBILINOGEN 0.2 09/27/2021 1609   NITRITE positive 08/29/2022 1533   NITRITE NEGATIVE 09/27/2021 1609   LEUKOCYTESUR Trace (A) 08/29/2022 1533   LEUKOCYTESUR NEGATIVE 09/27/2021 1609   Sepsis Labs: @LABRCNTIP (procalcitonin:4,lacticidven:4) ) Recent Results (from the past 240 hour(s))   Resp panel by RT-PCR (RSV, Flu A&B, Covid) Anterior Nasal Swab     Status: Abnormal   Collection Time: 10/29/22 10:24 AM   Specimen: Anterior Nasal Swab  Result Value Ref Range Status   SARS Coronavirus 2 by RT  PCR NEGATIVE NEGATIVE Final    Comment: (NOTE) SARS-CoV-2 target nucleic acids are NOT DETECTED.  The SARS-CoV-2 RNA is generally detectable in upper respiratory specimens during the acute phase of infection. The lowest concentration of SARS-CoV-2 viral copies this assay can detect is 138 copies/mL. A negative result does not preclude SARS-Cov-2 infection and should not be used as the sole basis for treatment or other patient management decisions. A negative result may occur with  improper specimen collection/handling, submission of specimen other than nasopharyngeal swab, presence of viral mutation(s) within the areas targeted by this assay, and inadequate number of viral copies(<138 copies/mL). A negative result must be combined with clinical observations, patient history, and epidemiological information. The expected result is Negative.  Fact Sheet for Patients:  EntrepreneurPulse.com.au  Fact Sheet for Healthcare Providers:  IncredibleEmployment.be  This test is no t yet approved or cleared by the [place] FDA and  has been authorized for detection and/or diagnosis of SARS-CoV-2 by FDA under an Emergency Use Authorization (EUA). This EUA will remain  in effect (meaning this test can be used) for the duration of the COVID-19 declaration under Section 564(b)(1) of the Act, 21 U.S.C.section 360bbb-3(b)(1), unless the authorization is terminated  or revoked sooner.       Influenza A by PCR POSITIVE (A) NEGATIVE Final   Influenza B by PCR NEGATIVE NEGATIVE Final    Comment: (NOTE) The Xpert Xpress SARS-CoV-2/FLU/RSV plus assay is intended as an aid in the diagnosis of influenza from Nasopharyngeal swab specimens and should not  be used as a sole basis for treatment. Nasal washings and aspirates are unacceptable for Xpert Xpress SARS-CoV-2/FLU/RSV testing.  Fact Sheet for Patients: EntrepreneurPulse.com.au  Fact Sheet for Healthcare Providers: IncredibleEmployment.be  This test is not yet approved or cleared by the [place] FDA and has been authorized for detection and/or diagnosis of SARS-CoV-2 by FDA under an Emergency Use Authorization (EUA). This EUA will remain in effect (meaning this test can be used) for the duration of the COVID-19 declaration under Section 564(b)(1) of the Act, 21 U.S.C. section 360bbb-3(b)(1), unless the authorization is terminated or revoked.     Resp Syncytial Virus by PCR NEGATIVE NEGATIVE Final    Comment: (NOTE) Fact Sheet for Patients: EntrepreneurPulse.com.au  Fact Sheet for Healthcare Providers: IncredibleEmployment.be  This test is not yet approved or cleared by the [place] FDA and has been authorized for detection and/or diagnosis of SARS-CoV-2 by FDA under an Emergency Use Authorization (EUA). This EUA will remain in effect (meaning this test can be used) for the duration of the COVID-19 declaration under Section 564(b)(1) of the Act, 21 U.S.C. section 360bbb-3(b)(1), unless the authorization is terminated or revoked.  Performed at [facility], [place] [address] [place], [place] [address]      Radiological Exams on Admission: CT ABDOMEN PELVIS W CONTRAST  Result Date: 11/04/2022 CLINICAL DATA:  Abdominal pain EXAM: CT ABDOMEN AND PELVIS WITH CONTRAST TECHNIQUE: Multidetector CT imaging of the abdomen and pelvis was performed using the standard protocol following bolus administration of intravenous contrast. RADIATION DOSE REDUCTION: This exam was performed according to the departmental dose-optimization program which includes automated exposure control, adjustment  of the mA and/or kV according to patient size and/or use of iterative reconstruction technique. CONTRAST:  59mL OMNIPAQUE IOHEXOL 350 MG/ML SOLN COMPARISON:  03/08/2020 FINDINGS: Lower chest: Small right-sided pleural effusion is noted. Atelectatic changes are noted in the right middle and lower lobes as well as in the left lower lobe.  Cardiac shadow is enlarged. Hepatobiliary: No focal liver abnormality is seen. Status post cholecystectomy. No biliary dilatation. Pancreas: Unremarkable. No pancreatic ductal dilatation or surrounding inflammatory changes. Spleen: Normal in size without focal abnormality. Adrenals/Urinary Tract: Adrenal glands are within normal limits. Kidneys demonstrate a normal enhancement pattern. Normal excretion is noted bilaterally. The bladder is well distended. Stomach/Bowel: Diverticular disease of the colon is noted. No evidence of diverticulitis is seen. The appendix has been surgically removed. Small bowel and stomach are within normal limits. Vascular/Lymphatic: Aortic atherosclerosis. No enlarged abdominal or pelvic lymph nodes. Reproductive: Status post hysterectomy. No adnexal masses. Other: No abdominal wall hernia or abnormality. No abdominopelvic ascites. Musculoskeletal: Mild degenerative changes of lumbar spine are noted. IMPRESSION: Diverticulosis without diverticulitis. Bilateral atelectatic changes as described with associated right-sided pleural effusion. Electronically Signed   By: Inez Catalina M.D.   On: 11/04/2022 02:17   CT Head Wo Contrast  Result Date: 11/04/2022 CLINICAL DATA:  Altered mental status EXAM: CT HEAD WITHOUT CONTRAST TECHNIQUE: Contiguous axial images were obtained from the base of the skull through the vertex without intravenous contrast. RADIATION DOSE REDUCTION: This exam was performed according to the departmental dose-optimization program which includes automated exposure control, adjustment of the mA and/or kV according to patient size and/or  use of iterative reconstruction technique. COMPARISON:  09/04/2021 FINDINGS: Brain: No evidence of acute infarction, hemorrhage, hydrocephalus, extra-axial collection or mass lesion/mass effect. Small lacunar infarct is noted in the left basal ganglia. Vascular: No hyperdense vessel or unexpected calcification. Skull: Normal. Negative for fracture or focal lesion. Sinuses/Orbits: No acute finding. Other: None. IMPRESSION: No acute intracranial abnormality noted. Chronic ischemic changes are noted. Electronically Signed   By: Inez Catalina M.D.   On: 11/04/2022 02:14   DG Chest 2 View  Result Date: 11/03/2022 CLINICAL DATA:  Shortness of breath and hypoxia, initial encounter EXAM: CHEST - 2 VIEW COMPARISON:  10/29/2022 FINDINGS: Cardiac shadow is enlarged but stable. Pacing device is again seen. Increasing right basilar infiltrate is noted. No effusion or pneumothorax is seen. IMPRESSION: Increasing right lower lobe and right middle lobe infiltrate. Electronically Signed   By: Inez Catalina M.D.   On: 11/03/2022 23:38    EKG: Independently reviewed. Atrial fibrillation.   Assessment/Plan   1. General weakness; deconditioning  - Patient with progressive weakness over decades attributed to inclusion body myositis and possible PMR presents with increased weakness/unable to get out of bed   - No acute findings on head CT and no definite focal deficit on exam; TSH was normal a few days ago  - Suspect this is d/t deconditioning after recent influenza with underlying inclusion body myositis  - Check CK, ESR, CRP, magnesium, and phosphorus, consult PT, continue supportive care, consider MRI brain if fails to improve    2. Atelectasis  - Saturating low-mid 90s on rm air, noted to have small pleural effusion and atelectasis on CT  - Incentive spirometry    3. Atrial fibrillation  - Continue Eliquis, metoprolol, digoxin   4. Hypokalemia  - Replace, repeat chem panel    5. Inclusion body myositis; PMR    - Continue low-dose prednisone  - Follow-up with rheumatology outpatient and neurology as planned    6. Hypertension  - Continue metoprolol, hold HCTZ while correcting electrolytes   7. Hypothyroidism  - Continue Synthroid    8. Chronic HFpEF  - Appeared hypovolemic in ED in setting of recent anorexia and N/V and was given 1 liter LR  - Monitor volume  status    DVT prophylaxis: Eliquis  Code Status: Full  Level of Care: Level of care: Med-Surg Family Communication: Husband at bedside  Disposition Plan:  Patient is from: ILF  Anticipated d/c is to: TBD Anticipated d/c date is: Possibly as early as 11/05/22  Patient currently: Pending improved oxygenation, PT evaluation  Consults called: None  Admission status: Observation     Vianne Bulls, MD [facility]  11/04/2022, 4:12 AM

## 2022-11-05 ENCOUNTER — Inpatient Hospital Stay (HOSPITAL_COMMUNITY): Payer: Medicare Other

## 2022-11-05 DIAGNOSIS — J9811 Atelectasis: Secondary | ICD-10-CM | POA: Diagnosis not present

## 2022-11-05 DIAGNOSIS — I5032 Chronic diastolic (congestive) heart failure: Secondary | ICD-10-CM | POA: Diagnosis not present

## 2022-11-05 DIAGNOSIS — J189 Pneumonia, unspecified organism: Secondary | ICD-10-CM

## 2022-11-05 DIAGNOSIS — I10 Essential (primary) hypertension: Secondary | ICD-10-CM | POA: Diagnosis not present

## 2022-11-05 DIAGNOSIS — R5381 Other malaise: Secondary | ICD-10-CM | POA: Diagnosis not present

## 2022-11-05 LAB — COMPREHENSIVE METABOLIC PANEL
ALT: 22 U/L (ref 0–44)
AST: 28 U/L (ref 15–41)
Albumin: 2.5 g/dL — ABNORMAL LOW (ref 3.5–5.0)
Alkaline Phosphatase: 44 U/L (ref 38–126)
Anion gap: 8 (ref 5–15)
BUN: 5 mg/dL — ABNORMAL LOW (ref 8–23)
CO2: 30 mmol/L (ref 22–32)
Calcium: 9.1 mg/dL (ref 8.9–10.3)
Chloride: 96 mmol/L — ABNORMAL LOW (ref 98–111)
Creatinine, Ser: 0.36 mg/dL — ABNORMAL LOW (ref 0.44–1.00)
GFR, Estimated: >60 mL/min (ref >60)
Glucose, Bld: 79 mg/dL (ref 70–99)
Potassium: 4.3 mmol/L (ref 3.5–5.1)
Sodium: 134 mmol/L — ABNORMAL LOW (ref 135–145)
Total Bilirubin: 1.2 mg/dL (ref 0.3–1.2)
Total Protein: 6.4 g/dL — ABNORMAL LOW (ref 6.5–8.1)

## 2022-11-05 LAB — CBC WITH DIFFERENTIAL/PLATELET
Abs Immature Granulocytes: 0.02 10*3/uL (ref 0.00–0.07)
Basophils Absolute: 0 10*3/uL (ref 0.0–0.1)
Basophils Relative: 0 %
Eosinophils Absolute: 0 10*3/uL (ref 0.0–0.5)
Eosinophils Relative: 0 %
HCT: 39 % (ref 36.0–46.0)
Hemoglobin: 13.2 g/dL (ref 12.0–15.0)
Immature Granulocytes: 0 %
Lymphocytes Relative: 30 %
Lymphs Abs: 1.6 10*3/uL (ref 0.7–4.0)
MCH: 31.7 pg (ref 26.0–34.0)
MCHC: 33.8 g/dL (ref 30.0–36.0)
MCV: 93.8 fL (ref 80.0–100.0)
Monocytes Absolute: 0.6 10*3/uL (ref 0.1–1.0)
Monocytes Relative: 10 %
Neutro Abs: 3.1 10*3/uL (ref 1.7–7.7)
Neutrophils Relative %: 60 %
Platelets: 199 10*3/uL (ref 150–400)
RBC: 4.16 MIL/uL (ref 3.87–5.11)
RDW: 12.9 % (ref 11.5–15.5)
WBC: 5.3 10*3/uL (ref 4.0–10.5)
nRBC: 0 % (ref 0.0–0.2)

## 2022-11-05 LAB — PHOSPHORUS: Phosphorus: 2.3 mg/dL — ABNORMAL LOW (ref 2.5–4.6)

## 2022-11-05 LAB — VITAMIN B12: Vitamin B-12: 1516 pg/mL — ABNORMAL HIGH (ref 180–914)

## 2022-11-05 LAB — MAGNESIUM: Magnesium: 1.9 mg/dL (ref 1.7–2.4)

## 2022-11-05 MED ORDER — LEVALBUTEROL HCL 0.63 MG/3ML IN NEBU
0.6300 mg | INHALATION_SOLUTION | Freq: Four times a day (QID) | RESPIRATORY_TRACT | Status: DC
  Administered 2022-11-05 – 2022-11-06 (×3): 0.63 mg via RESPIRATORY_TRACT
  Filled 2022-11-05 (×7): qty 3

## 2022-11-05 MED ORDER — K PHOS MONO-SOD PHOS DI & MONO 155-852-130 MG PO TABS
500.0000 mg | ORAL_TABLET | Freq: Once | ORAL | Status: AC
  Administered 2022-11-05: 500 mg via ORAL
  Filled 2022-11-05: qty 2

## 2022-11-05 MED ORDER — BISACODYL 5 MG PO TBEC: 5.0000 mg | DELAYED_RELEASE_TABLET | Freq: Every day | ORAL | Status: DC | PRN

## 2022-11-05 MED ORDER — GUAIFENESIN 100 MG/5ML PO LIQD
5.0000 mL | ORAL | Status: DC | PRN
  Administered 2022-11-06: 5 mL via ORAL
  Filled 2022-11-05: qty 15

## 2022-11-05 MED ORDER — IPRATROPIUM BROMIDE 0.02 % IN SOLN
0.5000 mg | Freq: Four times a day (QID) | RESPIRATORY_TRACT | Status: DC
  Administered 2022-11-05 – 2022-11-06 (×3): 0.5 mg via RESPIRATORY_TRACT
  Filled 2022-11-05 (×4): qty 2.5

## 2022-11-05 MED ORDER — POLYETHYLENE GLYCOL 3350 17 G PO PACK: 17.0000 g | PACK | Freq: Every day | ORAL | Status: DC | PRN

## 2022-11-05 MED ORDER — SODIUM CHLORIDE 0.9 % IV SOLN
3.0000 g | Freq: Four times a day (QID) | INTRAVENOUS | Status: DC
  Administered 2022-11-05 – 2022-11-07 (×9): 3 g via INTRAVENOUS
  Filled 2022-11-05 (×9): qty 8

## 2022-11-05 NOTE — Progress Notes (Signed)
OT Cancellation Note  Patient Details Name: Deanna Rivera MRN: [id number] DOB: 05-28-1933   Cancelled Treatment:    Reason Eval/Treat Not Completed: Patient at procedure or test/ unavailable (Upon arrival, RN and SLP educating pt on MBS with plans for procedure today.)  Elliot Cousin 11/05/2022, 8:29 AM

## 2022-11-05 NOTE — Progress Notes (Signed)
Pharmacy Antibiotic Note  Deanna Rivera is a 89 y.o. female admitted on 11/03/2022 with aspiration/CAP pneumonia.  Pharmacy has been consulted for Unasyn dosing. Patient concerned about possible reaction, but has tolerated Augmentin in early 2024.   Plan: Unasyn 3gm Iv q6h Monitor renal function Deescalate when appropriate  Height: 5\' 4"  (162.6 cm) Weight: 56.7 kg (125 lb) IBW/kg (Calculated) : 54.7  Temp (24hrs), Avg:98.1 F (36.7 C), Min:97.8 F (36.6 C), Max:98.7 F (37.1 C)  Recent Labs  Lab 11/03/22 2350 11/04/22 0019 11/04/22 0104 11/04/22 0445 11/05/22 0448  WBC 8.2  --   --  7.2 5.3  CREATININE 0.34* 0.20*  --  <0.30* 0.36*  LATICACIDVEN 1.3  --  1.6  --   --     Estimated Creatinine Clearance: 42.8 mL/min (A) (by C-G formula based on SCr of 0.36 mg/dL (L)).    Allergies  Allergen Reactions   Shellfish-Derived Products Shortness Of Breath and Other (See Comments)    Lips go numb   Epinephrine Other (See Comments) and Hypertension    "Makes me feel wired"   Monosodium Glutamate Other (See Comments)    other   Monosodium Glutamate Hypertension and Other (See Comments)    "Makes me feel badly"  Other Reaction(s): Other (See Comments)  other, other   Ciprofloxacin Other (See Comments)    Allergic reaction not recalled  Other Reaction(s): Other (See Comments)  Other reaction(s): Intolerance   Dronedarone     Other Reaction(s): Other (See Comments)  Leg swelling, Leg swelling   Erythromycin Other (See Comments)    Allergic reaction not recalled   Sulfa Antibiotics Other (See Comments)    Allergic reaction not recalled   Sulfamethoxazole-Trimethoprim    Tetracyclines & Related Other (See Comments)    Allergic reaction not recalled      Brett Darko A. Levada Dy, PharmD, BCPS, FNKF Clinical Pharmacist [place] Please utilize Amion for appropriate phone number to reach the unit pharmacist ([place])  11/05/2022 10:13 AM

## 2022-11-05 NOTE — Evaluation (Addendum)
Occupational Therapy Evaluation Patient Details Name: Deanna Rivera MRN: [id number] DOB: [birth year] Today's Date: 11/05/2022   History of Present Illness Pt is an 89 y.o. female who presented 11/03/22 with constipation and general weakness. Head CT was negative for acute intracranial abnormality. PMH: heart block with pacer, permanent atrial fibrillation on Eliquis, hypothyroidism, inclusion body myositis, HTN, and recent influenza A infection   Clinical Impression   Deanna Rivera was evaluated s/p the above admission list. She had become progressively weaker, now needed +1-3 assist fro tranfers at baseline; a few months ago she was bale to walk short distances with AD. Upon evaluation she was limited by generalized weakness, activity tolerance, poor balance and cognition. Overall she requires mod A for bed mobility and transfers, only tolerating ~30 seconds of standing at a time. Due to the deficits listed below she also requires set up for UB ADLs and max A for LB ADLs. Pt will benefit from continued acute OT services. Pt will benefit from skilled post-acute rehab services.       Recommendations for follow up therapy are one component of a multi-disciplinary discharge planning process, led by the attending physician.  Recommendations may be updated based on patient status, additional functional criteria and insurance authorization.      Patient can return home with the following A lot of help with walking and/or transfers;A lot of help with bathing/dressing/bathroom;Assistance with cooking/housework;Direct supervision/assist for medications management;Direct supervision/assist for financial management;Assist for transportation;Help with stairs or ramp for entrance    Functional Status Assessment  Patient has had a recent decline in their functional status and demonstrates the ability to make significant improvements in function in a reasonable and predictable amount of time.  Equipment  Recommendations  None recommended by OT       Precautions / Restrictions Precautions Precautions: Fall Restrictions Weight Bearing Restrictions: No      Mobility Bed Mobility Overal bed mobility: Needs Assistance Bed Mobility: Supine to Sit     Supine to sit: Mod assist          Transfers Overall transfer level: Needs assistance Equipment used: 1 person hand held assist Transfers: Sit to/from Stand, Bed to chair/wheelchair/BSC Sit to Stand: Mod assist Stand pivot transfers: Mod assist         General transfer comment: fear of falling      Balance Overall balance assessment: Needs assistance Sitting-balance support: No upper extremity supported, Feet supported Sitting balance-Leahy Scale: Fair     Standing balance support: Bilateral upper extremity supported, During functional activity Standing balance-Leahy Scale: Poor                             ADL either performed or assessed with clinical judgement   ADL Overall ADL's : Needs assistance/impaired Eating/Feeding: Independent;Sitting   Grooming: Set up;Sitting   Upper Body Bathing: Set up;Sitting   Lower Body Bathing: Maximal assistance;Sit to/from stand   Upper Body Dressing : Set up;Sitting   Lower Body Dressing: Maximal assistance;Sit to/from stand   Toilet Transfer: Moderate assistance;Stand-pivot   Toileting- Clothing Manipulation and Hygiene: Maximal assistance;Sit to/from stand       Functional mobility during ADLs: Moderate assistance General ADL Comments: generally weak and poor activity tolerance     Vision Baseline Vision/History: 0 No visual deficits Vision Assessment?: No apparent visual deficits     Perception Perception Perception Tested?: No   Praxis Praxis Praxis tested?: Not tested    Pertinent Vitals/Pain Pain Assessment  Pain Assessment: Faces Faces Pain Scale: Hurts a little bit Pain Location: hip Pain Descriptors / Indicators: Discomfort,  Guarding Pain Intervention(s): Limited activity within patient's tolerance, Monitored during session     Hand Dominance Right   Extremity/Trunk Assessment Upper Extremity Assessment Upper Extremity Assessment: Generalized weakness   Lower Extremity Assessment Lower Extremity Assessment: Defer to PT evaluation   Cervical / Trunk Assessment Cervical / Trunk Assessment: Kyphotic   Communication Communication Communication: No difficulties   Cognition Arousal/Alertness: Awake/alert Behavior During Therapy: WFL for tasks assessed/performed, Anxious Overall Cognitive Status: Within Functional Limits for tasks assessed                                 General Comments: Perseverating on SNF choices throughout, fear of falling     General Comments  VSS on 2L    Exercises Other Exercises Other Exercises: educated pt on IS use 10x/hour, provided pt with IS        Home Living Family/patient expects to be discharged to:: Assisted living Living Arrangements: Spouse/significant other Available Help at Discharge: Family;Available 24 hours/day Type of Home: Apartment Home Access: Elevator     Home Layout: One level     Bathroom Shower/Tub: Chief Strategy Officer: Rollator (4 wheels);BSC/3in1;Transport chair          Prior Functioning/Environment Prior Level of Function : Needs assist             Mobility Comments: Pt was able to perform transfers mod I to/from transport chair prior to recent flu infection, then has progressively needed increased assist of +1 to +3 for transfers. Pt was ambulating short distances with a rollator a few months ago ADLs Comments: needs assist        OT Problem List: Decreased strength;Decreased range of motion;Decreased activity tolerance;Impaired balance (sitting and/or standing);Decreased safety awareness;Decreased knowledge of use of DME or AE;Decreased knowledge of precautions;Pain      OT  Treatment/Interventions: Self-care/ADL training;Therapeutic exercise;Energy conservation;DME and/or AE instruction;Therapeutic activities;Patient/family education;Balance training    OT Goals(Current goals can be found in the care plan section) Acute Rehab OT Goals Patient Stated Goal: to get stronger OT Goal Formulation: With patient Time For Goal Achievement: 11/19/22 Potential to Achieve Goals: Fair ADL Goals Pt Will Perform Lower Body Dressing: with min assist;sit to/from stand Pt Will Transfer to Toilet: with min assist;bedside commode;stand pivot transfer Pt Will Perform Toileting - Clothing Manipulation and hygiene: with min guard assist;sitting/lateral leans Additional ADL Goal #1: pt will complete bed mobility with supervision A as a precursor to ADLs  OT Frequency: Min 2X/week       AM-PAC OT "6 Clicks" Daily Activity     Outcome Measure Help from another person eating meals?: None Help from another person taking care of personal grooming?: A Little Help from another person toileting, which includes using toliet, bedpan, or urinal?: A Lot Help from another person bathing (including washing, rinsing, drying)?: A Lot Help from another person to put on and taking off regular upper body clothing?: A Little Help from another person to put on and taking off regular lower body clothing?: A Lot 6 Click Score: 16   End of Session Equipment Utilized During Treatment: Gait belt Nurse Communication: Mobility status  Activity Tolerance: Patient tolerated treatment well Patient left: in chair;with call bell/phone within reach  OT Visit Diagnosis: Unsteadiness on feet (R26.81);Other abnormalities of gait  and mobility (R26.89);History of falling (Z91.81)                Time: 1255-1320 OT Time Calculation (min): 25 min Charges:  OT General Charges $OT Visit: 1 Visit OT Evaluation $OT Eval Moderate Complexity: 1 Mod OT Treatments $Self Care/Home Management : 8-22 mins  Shade Flood, OTR/L Acute Rehabilitation Services Office [phone] Secure Chat Communication Preferred   Elliot Cousin 11/05/2022, 1:43 PM

## 2022-11-05 NOTE — Progress Notes (Signed)
PROGRESS NOTE    Deanna Rivera  [id number] DOB: [birth year] DOA: 11/03/2022 PCP: [name], MD   Brief Narrative:  The patient is a elderly 89 year old chronically ill-appearing Caucasian female with a past medical history significant for but limited to heart block status post pacemaker placement, permanent atrial fibrillation on anticoagulation with Eliquis, hypothyroidism, inclusion body myositis, hypertension, recent influenza A infection who presented to the emergency department for constipation and progressively worsening weakness.  She had developed a cough, fatigue, nausea, vomiting and loss of appetite around 10/26/2022 and was diagnosed with influenza A.  While her nausea, vomiting and loss of appetite have resolved her cough has been slowly improving but she has suffered significantly worsened generalized weakness to the point where she is unable to get out of the bed for the last few days.  She been on ambulatory prior to her recent illness and has a history of muscle weakness going back to the 1970s when she was diagnosed with inclusion body myositis in [place].  She saw a local rheumatologist in 2022 who appeared to agree with the diagnosis and has progressive weakness over the years that is attributed to this.   In arrival to the ED she is found to be afebrile and saturating mid 90s on room air and EKG demonstrated atrial fibrillation.  Head CT was negative for any acute intracranial abnormalities and CT scan of the abdomen pelvis was negative for any acute intra-abdominal or pelvic abnormalities but was notable for small right pleural effusion and atelectasis involving right mid right lower and left lower lobes.  Labs were notable for potassium of 3.1 and albumin of 2.9 WBC and lactic acid were normal.  In the ED blood cultures obtained and she was given 1 L of lactated Ringer's as well as 10 mEq of IV potassium and 1 g of IV Rocephin.  She was placed on 2 L supplemental oxygen  and now found to have a RML and RLL PNA and will start on IV Unasyn.  Assessment and Plan:  General Weakness Deconditioning  -Patient with progressive weakness over decades attributed to inclusion body myositis and possible PMR presents with increased weakness/unable to get out of bed   -No acute findings on head CT and no definite focal deficit on exam; TSH was normal a few days ago  - Suspect this is d/t deconditioning after recent influenza with underlying inclusion body myositis  -Checked CK and was 61, ESR was 58, CRP was 6.5, -Mag and Phos Trend: Recent Labs  Lab 11/04/22 0445 11/05/22 0448  MG 1.5* 1.9  PHOS 2.8 2.3*  -Consulted PT and OT for further evaluation recommendations -Continue with supportive care and will consider MRI brain if fails to improve but likley in the setting of PNA -Gentle IV fluid hydration with normal saline at 75 MLS per hour now stopped -Will consult dietitian given her poor p.o. intake   Atelectasis and Right Middle Lobe and Lower Lobe PNA, poA -Saturating low-mid 90s on room air, noted to have small pleural effusion and atelectasis on CT as it showed "Bilateral atelectatic changes as described with associated right-sided pleural effusion." -Repeat CXR done and showed "Worsening right middle lobe consolidation which may reflect atelectasis or pneumonia and trace right effusion." -Recently had Influenza A -Incentive spirometry and will add flutter valve as well as guaifenesin 1200 g p.o. twice daily SpO2: 100 % O2 Flow Rate (L/min): 2 L/min -Continue with albuterol 3 mL IH every 6 as needed for wheezing or shortness  of breath and will change to Xopenex and Atrovent every 6 scheduled and continue with guaifenesin 5 mL every 4 as needed for cough -Will start the patient on IV Unasyn -SLP consulted and MBS done recommending regular diet with thin liquids -Will need continuous pulse oximetry maintain O2 saturation greater than 90% -Continue supplemental  oxygen via nasal cannula and wean O2 as tolerated -She will need an Ambulatory home O2 screen prior to discharge and a repeat CXR   Atrial Fibrillation  -Continue anticoagulation with apixaban 2.5 mg p.o. twice daily -Continue with Digoxin 125 mcg p.o. daily as well as metoprolol succinate 50 mg p.o. daily -Continue to monitor on MedSurg and moved to telemetry if necessary   Inclusion Body Myositis Polymyalgia Rheumatica    -Continue low-dose prednisone 1 mg po daily  -Follow-up with rheumatology outpatient and neurology as planned     Hypertension  -Continue Metoprolol Succinate 50 mg p.o. daily and hold hydrochlorothiazide while she is getting IV fluid hydration and therapies -Continue to monitor blood pressures per protocol -Her last blood pressure reading was 139/67   Hypothyroidism  -Recent TSH was 0.510, free T4 was 1.41 and T3 free serum was 0.9 -Continue with Levothyroxine 112 mcg p.o. daily   Chronic HFpEF  - Appeared hypovolemic in ED in setting of recent anorexia and N/V and was given 1 liter LR  -Still appears a little dry so we will start gentle IV fluid hydration with normal saline at 75 MLS per hour for 1 day -Strict I's and O's and daily weights and will need to continue monitor lites status carefully and for signs and symptoms of volume overload  Intake/Output Summary (Last 24 hours) at 11/05/2022 1421 Last data filed at 11/04/2022 1803 Gross per 24 hour  Intake 618.75 ml  Output --  Net 618.75 ml  -Patient is +2.344 L since admission  Hyponatremia -Na+ Trend: Recent Labs  Lab 10/23/22 1406 10/29/22 0957 11/03/22 2350 11/04/22 0019 11/04/22 0445 11/05/22 0448  NA 136 133* 132* 130* 132* 134*  -Continue to Hold hydrochlorothiazide for now and will get gentle IV fluid hydration with normal saline at 75 MLS per hour for now  Hypophosphatemia -Patient's Phos Level is now 2.3 -Phos Level Trend: Recent Labs  Lab 11/04/22 0445 11/05/22 0448  PHOS 2.8 2.3*   -Replete with po K Phos Neutral 500 mg po x1 -Cont   Constipation, improved -Continue with bowel regimen and have initiated bisacodyl 5 mg p.o. daily as needed for moderate constipation as well as MiraLAX 17 g p.o. daily.   Hypokalemia -Patient's K+ Level Trend: Recent Labs  Lab 10/23/22 1406 10/29/22 0957 11/03/22 2350 11/04/22 0019 11/04/22 0445 11/05/22 0448  K 4.0 3.0* 3.1* 6.1* 3.1* 4.3  -Replete with 40 mill colons p.o. once as well as 30 mill equivalents IV -Continue to Monitor and Replete as Necessary -Repeat CMP in the AM    Hypomagnesemia -Patient's Mag Level Trend: Recent Labs  Lab 11/04/22 0445 11/05/22 0448  MG 1.5* 1.9  -Replete with IV Mag Sulfate 2 grams yesterday -Continue to Monitor and Replete as Necessary -Repeat Mag in the AM    Hypoalbuminemia -Patient's Albumin Trend: Recent Labs  Lab 10/29/22 0957 11/03/22 2350 11/05/22 0448  ALBUMIN 3.5 2.9* 2.5*  -Continue to Monitor and Trend and repeat CMP in the AM  DVT prophylaxis: apixaban (ELIQUIS) tablet 2.5 mg Start: 11/04/22 1000 apixaban (ELIQUIS) tablet 2.5 mg    Code Status: Full Code Family Communication: Discussed with the husband at  bedside  Disposition Plan:  Level of care: Med-Surg Status is: Inpatient Remains inpatient appropriate because: Needs further clinical improvement and evaluation by PT OT.  They are recommending SNF and TOC involved and assistance with placement.   Consultants:  None  Procedures:  As delineated as above  Antimicrobials:  Anti-infectives (From admission, onward)    Start     Dose/Rate Route Frequency Ordered Stop   11/05/22 1115  Ampicillin-Sulbactam (UNASYN) 3 g in sodium chloride 0.9 % 100 mL IVPB        3 g 200 mL/hr over 30 Minutes Intravenous Every 6 hours 11/05/22 1016     11/04/22 0000  cefTRIAXone (ROCEPHIN) 1 g in sodium chloride 0.9 % 100 mL IVPB        1 g 200 mL/hr over 30 Minutes Intravenous  Once 11/03/22 2347 11/04/22 0051        Subjective: Seen and examined at bedside she is still very fatigued and states that she has a cough and difficulty bringing up her sputum.  No nausea or vomiting.  Still feels very weak.  Objective: Vitals:   11/04/22 1945 11/05/22 0451 11/05/22 0724 11/05/22 1300  BP: (!) 152/71 (!) 144/76 139/67   Pulse: 60 (!) 58 (!) 59   Resp: 18 16 18    Temp: 97.8 F (36.6 C) 97.8 F (36.6 C) 97.9 F (36.6 C)   TempSrc: Oral     SpO2: 100% 100% 100% 100%  Weight:      Height:        Intake/Output Summary (Last 24 hours) at 11/05/2022 1416 Last data filed at 11/04/2022 1803 Gross per 24 hour  Intake 618.75 ml  Output --  Net 618.75 ml   Filed Weights   11/03/22 2147 11/04/22 0515  Weight: 54 kg 56.7 kg   Examination: Physical Exam:  Constitutional: Thin frail elderly Caucasian female in no acute distress but appears fatigued Respiratory: Diminished to auscultation bilaterally with coarse breath sounds and has some rhonchi and a wet sounding cough.  No appreciable wheezing or rales.  No appreciable crackles noted.  Has a normal respiratory effort but is wearing supplemental oxygen nasal cannula Cardiovascular: RRR, no murmurs / rubs / gallops. S1 and S2 auscultated. No extremity edema. Abdomen: Soft, non-tender, non-distended. Bowel sounds positive.  GU: Deferred. Musculoskeletal: No clubbing / cyanosis of digits/nails. No joint deformity upper and lower extremities. Skin: No rashes, lesions, ulcers on a limited skin evaluation. No induration; Warm and dry.  Neurologic: CN 2-12 grossly intact with no focal deficits.  Romberg sign and cerebellar reflexes not assessed.  Psychiatric: Normal judgment and insight. Alert and oriented x 3. Normal mood and appropriate affect.   Data Reviewed: I have personally reviewed following labs and imaging studies  CBC: Recent Labs  Lab 11/03/22 2350 11/04/22 0019 11/04/22 0445 11/05/22 0448  WBC 8.2  --  7.2 5.3  NEUTROABS  --   --   --  3.1   HGB 15.1* 15.0 13.5 13.2  HCT 43.7 44.0 40.8 39.0  MCV 91.4  --  95.1 93.8  PLT 207  --  190 [id number]   Basic Metabolic Panel: Recent Labs  Lab 11/03/22 2350 11/04/22 0019 11/04/22 0445 11/05/22 0448  NA 132* 130* 132* 134*  K 3.1* 6.1* 3.1* 4.3  CL 91* 94* 95* 96*  CO2 26  --  28 30  GLUCOSE 122* 122* 103* 79  BUN 13 18 10  5*  CREATININE 0.34* 0.20* <0.30* 0.36*  CALCIUM 9.5  --  8.8* 9.1  MG  --   --  1.5* 1.9  PHOS  --   --  2.8 2.3*   GFR: Estimated Creatinine Clearance: 42.8 mL/min (A) (by C-G formula based on SCr of 0.36 mg/dL (L)). Liver Function Tests: Recent Labs  Lab 11/03/22 2350 11/05/22 0448  AST 33 28  ALT 26 22  ALKPHOS 46 44  BILITOT 0.9 1.2  PROT 7.1 6.4*  ALBUMIN 2.9* 2.5*   Recent Labs  Lab 11/03/22 2350  LIPASE 42   No results for input(s): "AMMONIA" in the last 168 hours. Coagulation Profile: No results for input(s): "INR", "PROTIME" in the last 168 hours. Cardiac Enzymes: Recent Labs  Lab 11/04/22 0445  CKTOTAL 61   BNP (last 3 results) No results for input(s): "PROBNP" in the last 8760 hours. HbA1C: No results for input(s): "HGBA1C" in the last 72 hours. CBG: No results for input(s): "GLUCAP" in the last 168 hours. Lipid Profile: No results for input(s): "CHOL", "HDL", "LDLCALC", "TRIG", "CHOLHDL", "LDLDIRECT" in the last 72 hours. Thyroid Function Tests: No results for input(s): "TSH", "T4TOTAL", "FREET4", "T3FREE", "THYROIDAB" in the last 72 hours. Anemia Panel: Recent Labs    11/05/22 0448  VITAMINB12 1,516*   Sepsis Labs: Recent Labs  Lab 11/03/22 2350 11/04/22 0104  LATICACIDVEN 1.3 1.6    Recent Results (from the past 240 hour(s))  Resp panel by RT-PCR (RSV, Flu A&B, Covid) Anterior Nasal Swab     Status: Abnormal   Collection Time: 10/29/22 10:24 AM   Specimen: Anterior Nasal Swab  Result Value Ref Range Status   SARS Coronavirus 2 by RT PCR NEGATIVE NEGATIVE Final    Comment: (NOTE) SARS-CoV-2 target nucleic  acids are NOT DETECTED.  The SARS-CoV-2 RNA is generally detectable in upper respiratory specimens during the acute phase of infection. The lowest concentration of SARS-CoV-2 viral copies this assay can detect is 138 copies/mL. A negative result does not preclude SARS-Cov-2 infection and should not be used as the sole basis for treatment or other patient management decisions. A negative result may occur with  improper specimen collection/handling, submission of specimen other than nasopharyngeal swab, presence of viral mutation(s) within the areas targeted by this assay, and inadequate number of viral copies(<138 copies/mL). A negative result must be combined with clinical observations, patient history, and epidemiological information. The expected result is Negative.  Fact Sheet for Patients:  EntrepreneurPulse.com.au  Fact Sheet for Healthcare Providers:  IncredibleEmployment.be  This test is no t yet approved or cleared by the [place] FDA and  has been authorized for detection and/or diagnosis of SARS-CoV-2 by FDA under an Emergency Use Authorization (EUA). This EUA will remain  in effect (meaning this test can be used) for the duration of the COVID-19 declaration under Section 564(b)(1) of the Act, 21 U.S.C.section 360bbb-3(b)(1), unless the authorization is terminated  or revoked sooner.       Influenza A by PCR POSITIVE (A) NEGATIVE Final   Influenza B by PCR NEGATIVE NEGATIVE Final    Comment: (NOTE) The Xpert Xpress SARS-CoV-2/FLU/RSV plus assay is intended as an aid in the diagnosis of influenza from Nasopharyngeal swab specimens and should not be used as a sole basis for treatment. Nasal washings and aspirates are unacceptable for Xpert Xpress SARS-CoV-2/FLU/RSV testing.  Fact Sheet for Patients: EntrepreneurPulse.com.au  Fact Sheet for Healthcare  Providers: IncredibleEmployment.be  This test is not yet approved or cleared by the [place] FDA and has been authorized for detection and/or diagnosis of SARS-CoV-2 by FDA  under an Emergency Use Authorization (EUA). This EUA will remain in effect (meaning this test can be used) for the duration of the COVID-19 declaration under Section 564(b)(1) of the Act, 21 U.S.C. section 360bbb-3(b)(1), unless the authorization is terminated or revoked.     Resp Syncytial Virus by PCR NEGATIVE NEGATIVE Final    Comment: (NOTE) Fact Sheet for Patients: EntrepreneurPulse.com.au  Fact Sheet for Healthcare Providers: IncredibleEmployment.be  This test is not yet approved or cleared by the [place] FDA and has been authorized for detection and/or diagnosis of SARS-CoV-2 by FDA under an Emergency Use Authorization (EUA). This EUA will remain in effect (meaning this test can be used) for the duration of the COVID-19 declaration under Section 564(b)(1) of the Act, 21 U.S.C. section 360bbb-3(b)(1), unless the authorization is terminated or revoked.  Performed at [facility], [place] [address], [place], [place] [address]   Blood culture (routine x 2)     Status: None (Preliminary result)   Collection Time: 11/04/22 12:13 AM   Specimen: BLOOD  Result Value Ref Range Status   Specimen Description BLOOD BLOOD LEFT ARM  Final   Special Requests   Final    BOTTLES DRAWN AEROBIC AND ANAEROBIC Blood Culture adequate volume   Culture   Final    NO GROWTH 1 DAY Performed at [facility], [place] [address], [place], [place] [address]    Report Status PENDING  Incomplete  Blood culture (routine x 2)     Status: None (Preliminary result)   Collection Time: 11/04/22  5:57 AM   Specimen: BLOOD  Result Value Ref Range Status   Specimen Description BLOOD BLOOD RIGHT HAND  Final   Special Requests   Final    BOTTLES DRAWN  AEROBIC AND ANAEROBIC Blood Culture adequate volume   Culture   Final    NO GROWTH 1 DAY Performed at [facility], [place] [address] [place], [place] [address]    Report Status PENDING  Incomplete    Radiology Studies: DG Swallowing Func-Speech Pathology  Result Date: 11/05/2022 Table formatting from the original result was not included. Modified Barium Swallow Study Patient Details Name: Nesiah Hathway MRN: [id number] Date of Birth: [birth year] Today's Date: 11/05/2022 HPI/PMH: HPI: Patient is an 89 y.o female with PMH: HTN, a-fib, hypothyroidism, inclusion body myositis, recent influenza infection. She presented to the hospital on 11/03/22 with generalized weakness and constipation. CT was negative for acute intracranial abnormality. CT cervical spine 09/04/21: Diffuse disc space narrowing and spurring. CT soft tissue neck 09/12/22: Pharynx and larynx: Epiglottis is normal in appearance. Bilateral tonsils are normal in size. Bilateral vocal cords are symmetric in appearance.  Patient has reported to PCP that food gets stuck in her throat. her husband has had to give her the Heimlich in the past.. She also reports that she has to clear her throat often in order to get food down. Her husband concurs, she has had difficulty swallowing for several years but might be getting worse. No history of aspiration pneumonia. Of note, a modified barium swallow study was recommended in the past but patient did not get this done. During an OP appointment with her neurologist, he recommended she proceed with this swallow test. Clinical Impression: Clinical Impression: Ms. Yarwood presents with a mild-moderate primary pharyngeal dysphagia that is likely chronic in nature and related to degenerative changes and spurring in cervical vertebrae as well as generalized weakness.  Oral phase was largely functional.  Pharyngeal phase was marked  by reduced laryngeal closure, tongue base retraction, epiglottic movement, and  pharyngoesophageal segment opening.  These impairments in the swallow were partially due to appearance of osteophytes which inhibited full inversion of epiglottis over larynx.  There was a prominent CP, leading to material collecting above.  Reduced propulsion through the pharynx/PES was also related to inferred weakness of the base of tongue/pharyngeal wall. There was consistent penetration of thin liquids, often to the level of the vocal folds. Penetration elicited a frequent cough response; there was minimal aspiration.  Solid food residue tended to remain in the valleculae and was difficult to transition.  A chin tuck was trialed, it may have offered marginal benefit in bolus transfer of solids and airway protection with thin liquids. Spent time after the study reviewing imaging in pt's room and discussing results with pt/husband.  She should try tucking chin with all solids/liquids. Continue current diet (regular solids/thin liquids); Pt and husband will need a lot of reinforcement to recall results and precautions. SLP will follow while admitted. Factors that may increase risk of adverse event in presence of aspiration ([place] 2021): Factors that may increase risk of adverse event in presence of aspiration ([place] 2021): Frail or deconditioned Recommendations/Plan: Swallowing Evaluation Recommendations Swallowing Evaluation Recommendations Recommendations: PO diet PO Diet Recommendation: Regular; Thin liquids (Level 0) Liquid Administration via: Cup; Straw Medication Administration: Whole meds with puree Supervision: Patient able to self-feed Swallowing strategies  : Chin tuck Postural changes: Position pt fully upright for meals Treatment Plan Treatment Plan Treatment recommendations: Therapy as outlined in treatment plan below Follow-up recommendations: -- (tba) Functional status assessment: Patient has had a recent decline in their functional status and demonstrates the ability to make  significant improvements in function in a reasonable and predictable amount of time. Treatment frequency: Min 2x/week Treatment duration: 2 weeks Interventions: Patient/family education; Diet toleration management by SLP; Oropharyngeal exercises Recommendations Recommendations for follow up therapy are one component of a multi-disciplinary discharge planning process, led by the attending physician.  Recommendations may be updated based on patient status, additional functional criteria and insurance authorization. Assessment: Orofacial Exam: Orofacial Exam Oral Cavity: Oral Hygiene: WFL Oral Cavity - Dentition: Adequate natural dentition Oral Motor/Sensory Function: WFL Anatomy: Anatomy: Suspected cervical osteophytes Boluses Administered: No data recorded Oral Impairment Domain: Oral Impairment Domain Lip Closure: No labial escape Tongue control during bolus hold: Cohesive bolus between tongue to palatal seal Bolus preparation/mastication: Slow prolonged chewing/mashing with complete recollection Bolus transport/lingual motion: Brisk tongue motion Oral residue: Trace residue lining oral structures Location of oral residue : Tongue Initiation of pharyngeal swallow : Posterior angle of the ramus  Pharyngeal Impairment Domain: Pharyngeal Impairment Domain Soft palate elevation: No bolus between soft palate (SP)/pharyngeal wall (PW) Laryngeal elevation: Complete superior movement of thyroid cartilage with complete approximation of arytenoids to epiglottic petiole Anterior hyoid excursion: Complete anterior movement Epiglottic movement: Partial inversion Laryngeal vestibule closure: Incomplete, narrow column air/contrast in laryngeal vestibule Pharyngeal stripping wave : Present - diminished Pharyngoesophageal segment opening: Partial distention/partial duration, partial obstruction of flow Tongue base retraction: Narrow column of contrast or air between tongue base and PPW Pharyngeal residue: Collection of residue  within or on pharyngeal structures Location of pharyngeal residue: Valleculae; Pyriform sinuses  Esophageal Impairment Domain: No data recorded Pill: No data recorded Penetration/Aspiration Scale Score: No data recorded Compensatory Strategies: Compensatory Strategies Compensatory strategies: Yes Straw: Ineffective Chin tuck: Effective (may have helped improved bolus flow and airway protection marginally) Effective Chin Tuck: Thin liquid (Level  0); Puree   General Information: Caregiver present: Yes  Diet Prior to this Study: Regular; Thin liquids (Level 0)   Temperature : Normal   No data recorded  Supplemental O2: Nasal cannula   History of Recent Intubation: No  Behavior/Cognition: Alert; Cooperative Self-Feeding Abilities: Able to self-feed Baseline vocal quality/speech: Normal Volitional Cough: Able to elicit Volitional Swallow: Able to elicit No data recorded Goal Planning: Prognosis for improved oropharyngeal function: Good No data recorded No data recorded Patient/Family Stated Goal: "My neurologist said you have to get this done" (speaking of MBS swallow test) No data recorded Pain: Pain Assessment Pain Assessment: No/denies pain Faces Pain Scale: 4 Pain Location: R shoulder, L arm Pain Descriptors / Indicators: Discomfort; Guarding Pain Intervention(s): Limited activity within patient's tolerance; Monitored during session; Repositioned End of Session: Start Time:SLP Start Time (ACUTE ONLY): 0850 Stop Time: SLP Stop Time (ACUTE ONLY): 0930 Time Calculation:SLP Time Calculation (min) (ACUTE ONLY): 40 min Charges: SLP Evaluations $ SLP Speech Visit: 1 Visit SLP Evaluations $BSS Swallow: 1 Procedure $MBS Swallow: 1 Procedure SLP visit diagnosis: SLP Visit Diagnosis: Dysphagia, oropharyngeal phase (R13.12) Past Medical History: Past Medical History: Diagnosis Date  Cataract   Hypertension   Moderate aortic stenosis   Moderate mitral regurgitation   Persistent atrial fibrillation (HCC)   PMR (polymyalgia  rheumatica) (HCC)   Second degree AV block   Sick sinus syndrome ([place])   Thyroid disease  Past Surgical History: Past Surgical History: Procedure Laterality Date  ABDOMINAL HYSTERECTOMY    APPENDECTOMY    CESAREAN SECTION    x 3  CHOLECYSTECTOMY    PACEMAKER GENERATOR CHANGE  11/20/2018  [employer] Accolade MRI PPM implanted in [place]   Juan Quam Laurice 11/05/2022, 11:55 AM  DG CHEST PORT 1 VIEW  Result Date: 11/05/2022 CLINICAL DATA:  Shortness of breath EXAM: PORTABLE CHEST 1 VIEW COMPARISON:  Chest radiograph 2 days prior FINDINGS: The left chest wall cardiac device is stable. Cardiomegaly is unchanged. The upper mediastinal contours are stable. There is worsening right middle lobe consolidation. Aeration is otherwise unchanged. There is trace right effusion. There is no significant left effusion. There is no pneumothorax There is no acute osseous abnormality. IMPRESSION: Worsening right middle lobe consolidation which may reflect atelectasis or pneumonia and trace right effusion. Electronically Signed   By: Valetta Mole M.D.   On: 11/05/2022 07:21   CT ABDOMEN PELVIS W CONTRAST  Result Date: 11/04/2022 CLINICAL DATA:  Abdominal pain EXAM: CT ABDOMEN AND PELVIS WITH CONTRAST TECHNIQUE: Multidetector CT imaging of the abdomen and pelvis was performed using the standard protocol following bolus administration of intravenous contrast. RADIATION DOSE REDUCTION: This exam was performed according to the departmental dose-optimization program which includes automated exposure control, adjustment of the mA and/or kV according to patient size and/or use of iterative reconstruction technique. CONTRAST:  3mL OMNIPAQUE IOHEXOL 350 MG/ML SOLN COMPARISON:  03/08/2020 FINDINGS: Lower chest: Small right-sided pleural effusion is noted. Atelectatic changes are noted in the right middle and lower lobes as well as in the left lower lobe. Cardiac shadow is  enlarged. Hepatobiliary: No focal liver abnormality is seen. Status post cholecystectomy. No biliary dilatation. Pancreas: Unremarkable. No pancreatic ductal dilatation or surrounding inflammatory changes. Spleen: Normal in size without focal abnormality. Adrenals/Urinary Tract: Adrenal glands are within normal limits. Kidneys demonstrate a normal enhancement pattern. Normal excretion is noted bilaterally. The bladder is well distended. Stomach/Bowel: Diverticular disease of the colon is noted. No  evidence of diverticulitis is seen. The appendix has been surgically removed. Small bowel and stomach are within normal limits. Vascular/Lymphatic: Aortic atherosclerosis. No enlarged abdominal or pelvic lymph nodes. Reproductive: Status post hysterectomy. No adnexal masses. Other: No abdominal wall hernia or abnormality. No abdominopelvic ascites. Musculoskeletal: Mild degenerative changes of lumbar spine are noted. IMPRESSION: Diverticulosis without diverticulitis. Bilateral atelectatic changes as described with associated right-sided pleural effusion. Electronically Signed   By: Inez Catalina M.D.   On: 11/04/2022 02:17   CT Head Wo Contrast  Result Date: 11/04/2022 CLINICAL DATA:  Altered mental status EXAM: CT HEAD WITHOUT CONTRAST TECHNIQUE: Contiguous axial images were obtained from the base of the skull through the vertex without intravenous contrast. RADIATION DOSE REDUCTION: This exam was performed according to the departmental dose-optimization program which includes automated exposure control, adjustment of the mA and/or kV according to patient size and/or use of iterative reconstruction technique. COMPARISON:  09/04/2021 FINDINGS: Brain: No evidence of acute infarction, hemorrhage, hydrocephalus, extra-axial collection or mass lesion/mass effect. Small lacunar infarct is noted in the left basal ganglia. Vascular: No hyperdense vessel or unexpected calcification. Skull: Normal. Negative for fracture or focal  lesion. Sinuses/Orbits: No acute finding. Other: None. IMPRESSION: No acute intracranial abnormality noted. Chronic ischemic changes are noted. Electronically Signed   By: Inez Catalina M.D.   On: 11/04/2022 02:14   DG Chest 2 View  Result Date: 11/03/2022 CLINICAL DATA:  Shortness of breath and hypoxia, initial encounter EXAM: CHEST - 2 VIEW COMPARISON:  10/29/2022 FINDINGS: Cardiac shadow is enlarged but stable. Pacing device is again seen. Increasing right basilar infiltrate is noted. No effusion or pneumothorax is seen. IMPRESSION: Increasing right lower lobe and right middle lobe infiltrate. Electronically Signed   By: Inez Catalina M.D.   On: 11/03/2022 23:38    Scheduled Meds:  apixaban  2.5 mg Oral BID   digoxin  125 mcg Oral Daily   ipratropium  0.5 mg Nebulization Q6H   levalbuterol  0.63 mg Nebulization Q6H   levothyroxine  112 mcg Oral QAC breakfast   metoprolol succinate  50 mg Oral Daily   mouth rinse  15 mL Mouth Rinse 4 times per day   predniSONE  1 mg Oral Daily   Continuous Infusions:  ampicillin-sulbactam (UNASYN) IV 3 g (11/05/22 1139)    LOS: 1 day   Raiford Noble, DO [facility] Available via Epic secure chat 7am-7pm After these hours, please refer to coverage provider listed on [email or web address] 11/05/2022, 2:16 PM

## 2022-11-05 NOTE — Progress Notes (Signed)
Modified Barium Swallow Study  Patient Details  Name: Deanna Rivera MRN: [id number] Date of Birth: January 29, 1933  Today's Date: 11/05/2022  Modified Barium Swallow completed.  Full report located under Chart Review in the Imaging Section.  History of Present Illness Patient is an 89 y.o female with PMH: HTN, a-fib, hypothyroidism, inclusion body myositis, recent influenza infection. She presented to the hospital on 11/03/22 with generalized weakness and constipation. CT was negative for acute intracranial abnormality. CT cervical spine 09/04/21: Diffuse disc space narrowing and spurring. CT soft tissue neck 09/12/22: Pharynx and larynx: Epiglottis is normal in appearance. Bilateral tonsils are normal in size. Bilateral vocal cords are symmetric in appearance.  Patient has reported to PCP that food gets stuck in her throat. her husband has had to give her the Heimlich in the past.. She also reports that she has to clear her throat often in order to get food down. Her husband concurs, she has had difficulty swallowing for several years but might be getting worse. No history of aspiration pneumonia. Of note, a modified barium swallow study was recommended in the past but patient did not get this done. During an OP appointment with her neurologist, he recommended she proceed with this swallow test.   Clinical Impression Deanna Rivera presents with a mild-moderate primary pharyngeal dysphagia that is likely chronic in nature and related to degenerative changes and spurring in cervical vertebrae as well as generalized weakness.  Oral phase was largely functional.  Pharyngeal phase was marked by reduced laryngeal closure, tongue base retraction, epiglottic movement, and pharyngoesophageal segment opening.  These impairments in the swallow were partially due to appearance of osteophytes which inhibited full inversion of epiglottis over larynx.  There was a prominent CP, leading to material collecting above.  Reduced  propulsion through the pharynx/PES was also related to inferred weakness of the base of tongue/pharyngeal wall. There was consistent penetration of thin liquids, often to the level of the vocal folds. Penetration elicited a frequent cough response; there was minimal aspiration.  Solid food residue tended to remain in the valleculae and was difficult to transition.  A chin tuck was trialed, it may have offered marginal benefit in bolus transfer of solids and airway protection with thin liquids. Spent time after the study reviewing imaging in pt's room and discussing results with pt/husband.  She should try tucking chin with all solids/liquids. Continue current diet (regular solids/thin liquids); Pt and husband will need a lot of reinforcement to recall results and precautions. SLP will follow while admitted. Factors that may increase risk of adverse event in presence of aspiration (Geronimo 2021): Frail or deconditioned  Swallow Evaluation Recommendations Recommendations: PO diet PO Diet Recommendation: Regular;Thin liquids (Level 0) Liquid Administration via: Cup;Straw Medication Administration: Whole meds with puree Supervision: Patient able to self-feed Swallowing strategies  : Chin tuck Postural changes: Position pt fully upright for meals     Deanna Rivera L. Tivis Ringer, MA CCC/SLP Clinical Specialist - Acute Care SLP Acute Rehabilitation Services Office number [phone]  Deanna Rivera 11/05/2022,11:54 AM

## 2022-11-05 NOTE — Progress Notes (Signed)
Mobility Specialist - Progress Note   11/05/22 0937  Mobility  Activity Dangled on edge of bed;Moved into chair position in bed  Level of Assistance Moderate assist, patient does 50-74%  Assistive Device None  Activity Response Tolerated well  Mobility Referral Yes  $Mobility charge 1 Mobility   Pt was received in bed. Pt had just received breakfast and needing help setting up. MS offered to assist pt to chair but pt declined once EOB. Pt was moved into chair position for breakfast. Pt with no specific complaints but when asked how she was she stated she was "terrible". Pt was left in bed with all needs me and husband present.    Franki Monte  Mobility Specialist Please contact via Solicitor or Rehab office at [phone]

## 2022-11-05 NOTE — TOC Progression Note (Addendum)
Transition of Care [facility]) - Progression Note    Patient Details  Name: Deanna Rivera MRN: [id number] Date of Birth: 08-11-1933  Transition of Care [facility] CM/SW Contact  Joanne Chars, LCSW Phone Number: 11/05/2022, 1:01 PM  Clinical Narrative:   Bed offers presented to pt and husband on medicare choice document.  Pt is asking for responses form [facility] and [facility].  CSW reached out to those facilities.    1520: [facility] does offer bed, still waiting on [facility].  CSW spoke with pt and husband.  Their first choice would be [facility].  Also had questions about visitation policy.  CSW reached out to [facility] again.    Expected Discharge Plan: [place] Barriers to Discharge: Continued Medical Work up  Expected Discharge Plan and Services In-house Referral: Clinical Social Work     Living arrangements for the past 2 months: [place] ([facility])                                       Social Determinants of Health (SDOH) Interventions SDOH Screenings   Food Insecurity: No Food Insecurity (11/04/2022)  Housing: Low Risk  (11/04/2022)  Transportation Needs: No Transportation Needs (11/04/2022)  Utilities: Not At Risk (11/04/2022)  Depression (PHQ2-9): High Risk (11/02/2022)  Financial Resource Strain: Low Risk  (11/14/2021)  Physical Activity: Inactive (11/14/2021)  Social Connections: Moderately Integrated (11/14/2021)  Stress: Stress Concern Present (11/14/2021)  Tobacco Use: Medium Risk (11/04/2022)    Readmission Risk Interventions     No data to display

## 2022-11-06 DIAGNOSIS — E43 Unspecified severe protein-calorie malnutrition: Secondary | ICD-10-CM | POA: Insufficient documentation

## 2022-11-06 DIAGNOSIS — R5381 Other malaise: Secondary | ICD-10-CM | POA: Diagnosis not present

## 2022-11-06 DIAGNOSIS — G7241 Inclusion body myositis [IBM]: Secondary | ICD-10-CM | POA: Diagnosis not present

## 2022-11-06 DIAGNOSIS — I10 Essential (primary) hypertension: Secondary | ICD-10-CM | POA: Diagnosis not present

## 2022-11-06 DIAGNOSIS — J9811 Atelectasis: Secondary | ICD-10-CM | POA: Diagnosis not present

## 2022-11-06 DIAGNOSIS — J690 Pneumonitis due to inhalation of food and vomit: Secondary | ICD-10-CM

## 2022-11-06 LAB — CBC WITH DIFFERENTIAL/PLATELET
Abs Immature Granulocytes: 0.04 10*3/uL (ref 0.00–0.07)
Basophils Absolute: 0 10*3/uL (ref 0.0–0.1)
Basophils Relative: 0 %
Eosinophils Absolute: 0 10*3/uL (ref 0.0–0.5)
Eosinophils Relative: 0 %
HCT: 38.5 % (ref 36.0–46.0)
Hemoglobin: 13.2 g/dL (ref 12.0–15.0)
Immature Granulocytes: 1 %
Lymphocytes Relative: 23 %
Lymphs Abs: 1.3 10*3/uL (ref 0.7–4.0)
MCH: 31.8 pg (ref 26.0–34.0)
MCHC: 34.3 g/dL (ref 30.0–36.0)
MCV: 92.8 fL (ref 80.0–100.0)
Monocytes Absolute: 0.5 10*3/uL (ref 0.1–1.0)
Monocytes Relative: 9 %
Neutro Abs: 3.8 10*3/uL (ref 1.7–7.7)
Neutrophils Relative %: 67 %
Platelets: 206 10*3/uL (ref 150–400)
RBC: 4.15 MIL/uL (ref 3.87–5.11)
RDW: 12.9 % (ref 11.5–15.5)
WBC: 5.7 10*3/uL (ref 4.0–10.5)
nRBC: 0 % (ref 0.0–0.2)

## 2022-11-06 LAB — COMPREHENSIVE METABOLIC PANEL
ALT: 22 U/L (ref 0–44)
AST: 28 U/L (ref 15–41)
Albumin: 2.4 g/dL — ABNORMAL LOW (ref 3.5–5.0)
Alkaline Phosphatase: 46 U/L (ref 38–126)
Anion gap: 8 (ref 5–15)
BUN: 6 mg/dL — ABNORMAL LOW (ref 8–23)
CO2: 29 mmol/L (ref 22–32)
Calcium: 8.9 mg/dL (ref 8.9–10.3)
Chloride: 98 mmol/L (ref 98–111)
Creatinine, Ser: 0.35 mg/dL — ABNORMAL LOW (ref 0.44–1.00)
GFR, Estimated: >60 mL/min (ref >60)
Glucose, Bld: 97 mg/dL (ref 70–99)
Potassium: 3.4 mmol/L — ABNORMAL LOW (ref 3.5–5.1)
Sodium: 135 mmol/L (ref 135–145)
Total Bilirubin: 0.9 mg/dL (ref 0.3–1.2)
Total Protein: 6.4 g/dL — ABNORMAL LOW (ref 6.5–8.1)

## 2022-11-06 LAB — MAGNESIUM: Magnesium: 1.9 mg/dL (ref 1.7–2.4)

## 2022-11-06 LAB — PHOSPHORUS: Phosphorus: 2.1 mg/dL — ABNORMAL LOW (ref 2.5–4.6)

## 2022-11-06 MED ORDER — POTASSIUM CHLORIDE CRYS ER 20 MEQ PO TBCR
40.0000 meq | EXTENDED_RELEASE_TABLET | Freq: Once | ORAL | Status: AC
  Administered 2022-11-06: 40 meq via ORAL
  Filled 2022-11-06: qty 2

## 2022-11-06 MED ORDER — K PHOS MONO-SOD PHOS DI & MONO 155-852-130 MG PO TABS
500.0000 mg | ORAL_TABLET | Freq: Two times a day (BID) | ORAL | Status: AC
  Administered 2022-11-06 (×2): 500 mg via ORAL
  Filled 2022-11-06 (×2): qty 2

## 2022-11-06 MED ORDER — ADULT MULTIVITAMIN W/MINERALS CH
1.0000 | ORAL_TABLET | Freq: Every day | ORAL | Status: DC
  Administered 2022-11-07: 1 via ORAL
  Filled 2022-11-06: qty 1

## 2022-11-06 MED ORDER — IPRATROPIUM BROMIDE 0.02 % IN SOLN: 0.5000 mg | Freq: Four times a day (QID) | RESPIRATORY_TRACT | Status: DC | PRN

## 2022-11-06 MED ORDER — ENSURE ENLIVE PO LIQD
237.0000 mL | Freq: Three times a day (TID) | ORAL | Status: DC
  Administered 2022-11-06 – 2022-11-07 (×2): 237 mL via ORAL
  Filled 2022-11-06: qty 237

## 2022-11-06 MED ORDER — LEVALBUTEROL HCL 0.63 MG/3ML IN NEBU: 0.6300 mg | INHALATION_SOLUTION | Freq: Four times a day (QID) | RESPIRATORY_TRACT | Status: DC | PRN

## 2022-11-06 MED ORDER — DRONABINOL 2.5 MG PO CAPS
2.5000 mg | ORAL_CAPSULE | Freq: Two times a day (BID) | ORAL | Status: DC
  Administered 2022-11-06: 2.5 mg via ORAL
  Filled 2022-11-06 (×2): qty 1

## 2022-11-06 MED ORDER — HYDROCOD POLI-CHLORPHE POLI ER 10-8 MG/5ML PO SUER: 5.0000 mL | Freq: Two times a day (BID) | ORAL | Status: DC | PRN

## 2022-11-06 NOTE — Progress Notes (Signed)
PROGRESS NOTE    Deanna Rivera  [id number] DOB: 1933/07/17 DOA: 11/03/2022 PCP: [name], MD   Brief Narrative:  The patient is a elderly 89 year old chronically ill-appearing Caucasian female with a past medical history significant for but limited to heart block status post pacemaker placement, permanent atrial fibrillation on anticoagulation with Eliquis, hypothyroidism, inclusion body myositis, hypertension, recent influenza A infection who presented to the emergency department for constipation and progressively worsening weakness.  She had developed a cough, fatigue, nausea, vomiting and loss of appetite around 10/26/2022 and was diagnosed with influenza A.  While her nausea, vomiting and loss of appetite have resolved her cough has been slowly improving but she has suffered significantly worsened generalized weakness to the point where she is unable to get out of the bed for the last few days.  She been on ambulatory prior to her recent illness and has a history of muscle weakness going back to the 1970s when she was diagnosed with inclusion body myositis in [place].  She saw a local rheumatologist in 2022 who appeared to agree with the diagnosis and has progressive weakness over the years that is attributed to this.   In arrival to the ED she is found to be afebrile and saturating mid 90s on room air and EKG demonstrated atrial fibrillation.  Head CT was negative for any acute intracranial abnormalities and CT scan of the abdomen pelvis was negative for any acute intra-abdominal or pelvic abnormalities but was notable for small right pleural effusion and atelectasis involving right mid right lower and left lower lobes.  Labs were notable for potassium of 3.1 and albumin of 2.9 WBC and lactic acid were normal.  In the ED blood cultures obtained and she was given 1 L of lactated Ringer's as well as 10 mEq of IV potassium and 1 g of IV Rocephin.  She was placed on 2 L supplemental oxygen  and now found to have a RML and RLL PNA and will start on IV Unasyn.   SLP evaluated and patient was taken for an MBS and she is noted to have mild to moderate primary pharyngeal dysphagia is likely chronic in nature and related to other degenerative changes and spurring in the cervical vertebra as well as generalized weakness and oral consent was largely functional.  She had a markedly reduced laryngeal closure tongue base retraction and epiglottic movement of pharyngeal esophageal segment opening.  SLP feels that she has been intermittently aspirating for some time and overall weakness has been exacerbated her swallowing difficulty and SLP feels like her dysphagia will wax and wane and the adverse consequences of aspiration will recur at some point in the future.  We will Continue to treat pneumonia and trying to the patient to cough up her sputum and initiated flutter valve and incentive spirometry she will need to go to SNF for rehabilitative efforts  Assessment and Plan:  General Weakness and Physical Deconditioning  -Patient with progressive weakness over decades attributed to inclusion body myositis and possible PMR presents with increased weakness/unable to get out of bed   -No acute findings on head CT and no definite focal deficit on exam; TSH was normal a few days ago  - Suspect this is d/t deconditioning after recent influenza with underlying inclusion body myositis  -Checked CK and was 61, ESR was 58, CRP was 6.5, Vitamin B12 Level was 1516 -Mag and Phos Trend: Recent Labs  Lab 11/04/22 0445 11/05/22 0448 11/06/22 0349  MG 1.5* 1.9 1.9  PHOS 2.8 2.3* 2.1*  -Consulted PT and OT for further evaluation recommendations and recommending SNF -Continue with supportive care and will consider MRI brain if fails to improve but likley in the setting of PNA -Gentle IV fluid hydration with normal saline at 75 MLS per hour now stopped -Will consult dietitian given her poor p.o. intake and  decreased appetite -Treat Underlying Causes and Aspiration Pneumonia as below   Atelectasis and Right Middle Lobe and Lower Lobe PNA, poA from Aspiration  -Saturating low-mid 90s on room air, noted to have small pleural effusion and atelectasis on CT as it showed "Bilateral atelectatic changes as described with associated right-sided pleural effusion." -Repeat CXR done and showed "Worsening right middle lobe consolidation which may reflect atelectasis or pneumonia and trace right effusion." -Recently had Influenza A -Incentive spirometry and will add flutter valve as well as guaifenesin 1200 g p.o. twice daily SpO2: 100 % O2 Flow Rate (L/min): 2 L/min FiO2 (%): 28 % -Continue with albuterol 3 mL IH every 6 as needed for wheezing or shortness of breath and will change to Xopenex and Atrovent every 6 scheduled and continue with guaifenesin 5 mL every 4 as needed for cough -Will start the patient on IV Unasyn -SLP consulted and MBS done and patient intermittently aspirating. SLP recommending regular diet with thin liquids with aspiration precaution and manuevers  -Will need continuous pulse oximetry maintain O2 saturation greater than 90% -Continue supplemental oxygen via nasal cannula and wean O2 as tolerated -She will need an Ambulatory home O2 screen prior to discharge and a repeat CXR -Repeat CXR done and showed "Worsening right middle lobe consolidation which may reflect atelectasis or pneumonia and trace right effusion." -Will consider palliative care consultation for goals of care discussion   Atrial Fibrillation  -Continue anticoagulation with Apixaban 2.5 mg p.o. twice daily -Continue with Digoxin 125 mcg p.o. daily as well as metoprolol succinate 50 mg p.o. daily -Continue to monitor on MedSurg and moved to telemetry if necessary   Inclusion Body Myositis Polymyalgia Rheumatica    -Continue low-dose prednisone 1 mg po daily  -Follow-up with rheumatology outpatient and neurology as  planned     Hypertension  -Continue Metoprolol Succinate 50 mg p.o. daily and hold hydrochlorothiazide while she is getting IV fluid hydration and therapies -Continue to monitor blood pressures per protocol -Her last blood pressure reading was 147/65   Hypothyroidism  -Recent TSH was 0.510, free T4 was 1.41 and T3 free serum was 0.9 -Continue with Levothyroxine 112 mcg p.o. daily   Chronic HFpEF  - Appeared hypovolemic in ED in setting of recent anorexia and N/V and was given 1 liter LR  -Still appears a little dry so we will start gentle IV fluid hydration with normal saline at 75 MLS per hour for 1 day -Strict I's and O's and daily weights and will need to continue monitor lites status carefully and for signs and symptoms of volume overload  Intake/Output Summary (Last 24 hours) at 11/06/2022 1714 Last data filed at 11/06/2022 1652 Gross per 24 hour  Intake 670 ml  Output 450 ml  Net 220 ml    Hyponatremia -Na+ Trend: Recent Labs  Lab 10/23/22 1406 10/29/22 0957 11/03/22 2350 11/04/22 0019 11/04/22 0445 11/05/22 0448 11/06/22 0349  NA 136 133* 132* 130* 132* 134* 135  -Continue to Hold hydrochlorothiazide for now  -Gentle IV fluid hydration with normal saline at 75 MLS per hour now stopped    Hypophosphatemia -Phos Level Trend: Recent Labs  Lab 11/04/22 0445 11/05/22 0448 11/06/22 0349  PHOS 2.8 2.3* 2.1*  -Replete with po K Phos Neutral 500 mg po BID -Cont   Constipation, improved -Continue with bowel regimen and have initiated bisacodyl 5 mg p.o. daily as needed for moderate constipation as well as MiraLAX 17 g p.o. daily.   Hypokalemia -Patient's K+ Level Trend: Recent Labs  Lab 10/23/22 1406 10/29/22 0957 11/03/22 2350 11/04/22 0019 11/04/22 0445 11/05/22 0448 11/06/22 0349  K 4.0 3.0* 3.1* 6.1* 3.1* 4.3 3.4*  -Replete with po Kcl 40 mEQ and po K Phos Neutral 500 mg po BID -Continue to Monitor and Replete as Necessary -Repeat CMP in the AM     Hypomagnesemia -Patient's Mag Level Trend: Recent Labs  Lab 11/04/22 0445 11/05/22 0448 11/06/22 0349  MG 1.5* 1.9 1.9  -Replete with IV Mag Sulfate 2 grams yesterday -Continue to Monitor and Replete as Necessary -Repeat Mag in the AM    Hypoalbuminemia -Patient's Albumin Trend: Recent Labs  Lab 10/29/22 0957 11/03/22 2350 11/05/22 0448 11/06/22 0349  ALBUMIN 3.5 2.9* 2.5* 2.4*  -Continue to Monitor and Trend and repeat CMP in the AM  Severe Malnutrition in the Context of Acute Illness/Injury -Dietitian consulted for further evaluation and recommendations Nutrition Status: Nutrition Problem: Severe Malnutrition Etiology: acute illness Signs/Symptoms: severe muscle depletion, moderate fat depletion, energy intake < or equal to 50% for > or equal to 5 days Interventions: Ensure Enlive (each supplement provides 350kcal and 20 grams of protein), MVI -Will Add Marinol 2.5 mg po BID given poor appetite   DVT prophylaxis: apixaban (ELIQUIS) tablet 2.5 mg Start: 11/04/22 1000 apixaban (ELIQUIS) tablet 2.5 mg    Code Status: Full Code Family Communication: No family currently at bedside  Disposition Plan:  Level of care: Med-Surg Status is: Inpatient Remains inpatient appropriate because: His further clinical improvement in her pneumonia and assessment of oxygen status once we do an ambulatory home O2 screen and she will need to go to SNF for rehabilitative efforts   Consultants:  None  Procedures:  As delineated above  Antimicrobials:  Anti-infectives (From admission, onward)    Start     Dose/Rate Route Frequency Ordered Stop   11/05/22 1115  Ampicillin-Sulbactam (UNASYN) 3 g in sodium chloride 0.9 % 100 mL IVPB        3 g 200 mL/hr over 30 Minutes Intravenous Every 6 hours 11/05/22 1016     11/04/22 0000  cefTRIAXone (ROCEPHIN) 1 g in sodium chloride 0.9 % 100 mL IVPB        1 g 200 mL/hr over 30 Minutes Intravenous  Once 11/03/22 2347 11/04/22 0051        Subjective: Seen and examined at bedside and she is still feeling very weak.  Not able to bring up very much sputum and continues to cough and sounds a wet sounding cough.  No nausea or vomiting.  No lightheadedness or dizziness.  No other concerns or complaints at this time.  Objective: Vitals:   11/05/22 2010 11/06/22 0406 11/06/22 0633 11/06/22 0723  BP:  (!) 156/72  (!) 147/65  Pulse:  60  (!) 59  Resp:  16    Temp:  98.1 F (36.7 C)  (!) 97.2 F (36.2 C)  TempSrc:    Oral  SpO2: 98% 100%  100%  Weight:   59.4 kg   Height:        Intake/Output Summary (Last 24 hours) at 11/06/2022 0819 Last data filed at 11/06/2022 [id number] Gross per  24 hour  Intake 550.06 ml  Output 350 ml  Net 200.06 ml   Filed Weights   11/03/22 2147 11/04/22 0515 11/06/22 [id number]  Weight: 54 kg 56.7 kg 59.4 kg   Examination: Physical Exam:  Constitutional: Thin frail elderly Caucasian female in no acute distress appears fatigued and slightly uncomfortable Respiratory: Diminished to auscultation bilaterally with coarse breath sounds and has some rhonchi and wet sounding cough.  No appreciable wheezing or rales.  Minimal crackles appreciated.  Has a normal respiratory effort but is wearing supplemental oxygen nasal cannula. Cardiovascular: RRR, no murmurs / rubs / gallops. S1 and S2 auscultated.  No appreciable extremity edema Abdomen: Soft, non-tender, non-distended. Bowel sounds positive.  GU: Deferred. Musculoskeletal: No clubbing / cyanosis of digits/nails. No joint deformity upper and lower extremities. Skin: No rashes, lesions, ulcers on limited skin evaluation. No induration; Warm and dry.  Neurologic: CN 2-12 grossly intact with no focal deficits. Romberg sign and cerebellar reflexes not assessed.  Psychiatric: Normal judgment and insight. Alert and oriented x 3. Normal mood and appropriate affect.   Data Reviewed: I have personally reviewed following labs and imaging studies  CBC: Recent Labs   Lab 11/03/22 2350 11/04/22 0019 11/04/22 0445 11/05/22 0448 11/06/22 0349  WBC 8.2  --  7.2 5.3 5.7  NEUTROABS  --   --   --  3.1 3.8  HGB 15.1* 15.0 13.5 13.2 13.2  HCT 43.7 44.0 40.8 39.0 38.5  MCV 91.4  --  95.1 93.8 92.8  PLT 207  --  190 199 [id number]   Basic Metabolic Panel: Recent Labs  Lab 11/03/22 2350 11/04/22 0019 11/04/22 0445 11/05/22 0448 11/06/22 0349  NA 132* 130* 132* 134* 135  K 3.1* 6.1* 3.1* 4.3 3.4*  CL 91* 94* 95* 96* 98  CO2 26  --  28 30 29   GLUCOSE 122* 122* 103* 79 97  BUN 13 18 10  5* 6*  CREATININE 0.34* 0.20* <0.30* 0.36* 0.35*  CALCIUM 9.5  --  8.8* 9.1 8.9  MG  --   --  1.5* 1.9 1.9  PHOS  --   --  2.8 2.3* 2.1*   GFR: Estimated Creatinine Clearance: 42.8 mL/min (A) (by C-G formula based on SCr of 0.35 mg/dL (L)). Liver Function Tests: Recent Labs  Lab 11/03/22 2350 11/05/22 0448 11/06/22 0349  AST 33 28 28  ALT 26 22 22   ALKPHOS 46 44 46  BILITOT 0.9 1.2 0.9  PROT 7.1 6.4* 6.4*  ALBUMIN 2.9* 2.5* 2.4*   Recent Labs  Lab 11/03/22 2350  LIPASE 42   No results for input(s): "AMMONIA" in the last 168 hours. Coagulation Profile: No results for input(s): "INR", "PROTIME" in the last 168 hours. Cardiac Enzymes: Recent Labs  Lab 11/04/22 0445  CKTOTAL 61   BNP (last 3 results) No results for input(s): "PROBNP" in the last 8760 hours. HbA1C: No results for input(s): "HGBA1C" in the last 72 hours. CBG: No results for input(s): "GLUCAP" in the last 168 hours. Lipid Profile: No results for input(s): "CHOL", "HDL", "LDLCALC", "TRIG", "CHOLHDL", "LDLDIRECT" in the last 72 hours. Thyroid Function Tests: No results for input(s): "TSH", "T4TOTAL", "FREET4", "T3FREE", "THYROIDAB" in the last 72 hours. Anemia Panel: Recent Labs    11/05/22 0448  VITAMINB12 1,516*   Sepsis Labs: Recent Labs  Lab 11/03/22 2350 11/04/22 0104  LATICACIDVEN 1.3 1.6   Recent Results (from the past 240 hour(s))  Resp panel by RT-PCR (RSV, Flu A&B,  Covid) Anterior Nasal Swab  Status: Abnormal   Collection Time: 10/29/22 10:24 AM   Specimen: Anterior Nasal Swab  Result Value Ref Range Status   SARS Coronavirus 2 by RT PCR NEGATIVE NEGATIVE Final    Comment: (NOTE) SARS-CoV-2 target nucleic acids are NOT DETECTED.  The SARS-CoV-2 RNA is generally detectable in upper respiratory specimens during the acute phase of infection. The lowest concentration of SARS-CoV-2 viral copies this assay can detect is 138 copies/mL. A negative result does not preclude SARS-Cov-2 infection and should not be used as the sole basis for treatment or other patient management decisions. A negative result may occur with  improper specimen collection/handling, submission of specimen other than nasopharyngeal swab, presence of viral mutation(s) within the areas targeted by this assay, and inadequate number of viral copies(<138 copies/mL). A negative result must be combined with clinical observations, patient history, and epidemiological information. The expected result is Negative.  Fact Sheet for Patients:  EntrepreneurPulse.com.au  Fact Sheet for Healthcare Providers:  IncredibleEmployment.be  This test is no t yet approved or cleared by the [place] FDA and  has been authorized for detection and/or diagnosis of SARS-CoV-2 by FDA under an Emergency Use Authorization (EUA). This EUA will remain  in effect (meaning this test can be used) for the duration of the COVID-19 declaration under Section 564(b)(1) of the Act, 21 U.S.C.section 360bbb-3(b)(1), unless the authorization is terminated  or revoked sooner.       Influenza A by PCR POSITIVE (A) NEGATIVE Final   Influenza B by PCR NEGATIVE NEGATIVE Final    Comment: (NOTE) The Xpert Xpress SARS-CoV-2/FLU/RSV plus assay is intended as an aid in the diagnosis of influenza from Nasopharyngeal swab specimens and should not be used as a sole basis for treatment.  Nasal washings and aspirates are unacceptable for Xpert Xpress SARS-CoV-2/FLU/RSV testing.  Fact Sheet for Patients: EntrepreneurPulse.com.au  Fact Sheet for Healthcare Providers: IncredibleEmployment.be  This test is not yet approved or cleared by the [place] FDA and has been authorized for detection and/or diagnosis of SARS-CoV-2 by FDA under an Emergency Use Authorization (EUA). This EUA will remain in effect (meaning this test can be used) for the duration of the COVID-19 declaration under Section 564(b)(1) of the Act, 21 U.S.C. section 360bbb-3(b)(1), unless the authorization is terminated or revoked.     Resp Syncytial Virus by PCR NEGATIVE NEGATIVE Final    Comment: (NOTE) Fact Sheet for Patients: EntrepreneurPulse.com.au  Fact Sheet for Healthcare Providers: IncredibleEmployment.be  This test is not yet approved or cleared by the [place] FDA and has been authorized for detection and/or diagnosis of SARS-CoV-2 by FDA under an Emergency Use Authorization (EUA). This EUA will remain in effect (meaning this test can be used) for the duration of the COVID-19 declaration under Section 564(b)(1) of the Act, 21 U.S.C. section 360bbb-3(b)(1), unless the authorization is terminated or revoked.  Performed at [facility], [place] [address], [place], [place] [address]   Blood culture (routine x 2)     Status: None (Preliminary result)   Collection Time: 11/04/22 12:13 AM   Specimen: BLOOD  Result Value Ref Range Status   Specimen Description BLOOD BLOOD LEFT ARM  Final   Special Requests   Final    BOTTLES DRAWN AEROBIC AND ANAEROBIC Blood Culture adequate volume   Culture   Final    NO GROWTH 1 DAY Performed at [facility], [place] [address], [place], [place] [address]    Report Status PENDING  Incomplete  Blood  culture (routine x 2)     Status: None (Preliminary  result)   Collection Time: 11/04/22  5:57 AM   Specimen: BLOOD  Result Value Ref Range Status   Specimen Description BLOOD BLOOD RIGHT HAND  Final   Special Requests   Final    BOTTLES DRAWN AEROBIC AND ANAEROBIC Blood Culture adequate volume   Culture   Final    NO GROWTH 1 DAY Performed at [facility], [address], [place], [place] [address]    Report Status PENDING  Incomplete    Radiology Studies: DG Swallowing Func-Speech Pathology  Result Date: 11/05/2022 Table formatting from the original result was not included. Modified Barium Swallow Study Patient Details Name: Darlah Schrage MRN: [id number] Date of Birth: [birth year] Today's Date: 11/05/2022 HPI/PMH: HPI: Patient is an 89 y.o female with PMH: HTN, a-fib, hypothyroidism, inclusion body myositis, recent influenza infection. She presented to the hospital on 11/03/22 with generalized weakness and constipation. CT was negative for acute intracranial abnormality. CT cervical spine 09/04/21: Diffuse disc space narrowing and spurring. CT soft tissue neck 09/12/22: Pharynx and larynx: Epiglottis is normal in appearance. Bilateral tonsils are normal in size. Bilateral vocal cords are symmetric in appearance.  Patient has reported to PCP that food gets stuck in her throat. her husband has had to give her the Heimlich in the past.. She also reports that she has to clear her throat often in order to get food down. Her husband concurs, she has had difficulty swallowing for several years but might be getting worse. No history of aspiration pneumonia. Of note, a modified barium swallow study was recommended in the past but patient did not get this done. During an OP appointment with her neurologist, he recommended she proceed with this swallow test. Clinical Impression: Clinical Impression: Ms. Kopinski presents with a mild-moderate primary pharyngeal dysphagia that is likely chronic in nature and related to degenerative changes and spurring in cervical  vertebrae as well as generalized weakness.  Oral phase was largely functional.  Pharyngeal phase was marked by reduced laryngeal closure, tongue base retraction, epiglottic movement, and pharyngoesophageal segment opening.  These impairments in the swallow were partially due to appearance of osteophytes which inhibited full inversion of epiglottis over larynx.  There was a prominent CP, leading to material collecting above.  Reduced propulsion through the pharynx/PES was also related to inferred weakness of the base of tongue/pharyngeal wall. There was consistent penetration of thin liquids, often to the level of the vocal folds. Penetration elicited a frequent cough response; there was minimal aspiration.  Solid food residue tended to remain in the valleculae and was difficult to transition.  A chin tuck was trialed, it may have offered marginal benefit in bolus transfer of solids and airway protection with thin liquids. Spent time after the study reviewing imaging in pt's room and discussing results with pt/husband.  She should try tucking chin with all solids/liquids. Continue current diet (regular solids/thin liquids); Pt and husband will need a lot of reinforcement to recall results and precautions. SLP will follow while admitted. Factors that may increase risk of adverse event in presence of aspiration ([place] 2021): Factors that may increase risk of adverse event in presence of aspiration ([place] 2021): Frail or deconditioned Recommendations/Plan: Swallowing Evaluation Recommendations Swallowing Evaluation Recommendations Recommendations: PO diet PO Diet Recommendation: Regular; Thin liquids (Level 0) Liquid Administration via: Cup; Straw Medication Administration: Whole meds with puree Supervision: Patient able to self-feed Swallowing strategies  : [employer]  tuck Postural changes: Position pt fully upright for meals Treatment Plan Treatment Plan Treatment recommendations: Therapy as outlined  in treatment plan below Follow-up recommendations: -- (tba) Functional status assessment: Patient has had a recent decline in their functional status and demonstrates the ability to make significant improvements in function in a reasonable and predictable amount of time. Treatment frequency: Min 2x/week Treatment duration: 2 weeks Interventions: Patient/family education; Diet toleration management by SLP; Oropharyngeal exercises Recommendations Recommendations for follow up therapy are one component of a multi-disciplinary discharge planning process, led by the attending physician.  Recommendations may be updated based on patient status, additional functional criteria and insurance authorization. Assessment: Orofacial Exam: Orofacial Exam Oral Cavity: Oral Hygiene: WFL Oral Cavity - Dentition: Adequate natural dentition Oral Motor/Sensory Function: WFL Anatomy: Anatomy: Suspected cervical osteophytes Boluses Administered: No data recorded Oral Impairment Domain: Oral Impairment Domain Lip Closure: No labial escape Tongue control during bolus hold: Cohesive bolus between tongue to palatal seal Bolus preparation/mastication: Slow prolonged chewing/mashing with complete recollection Bolus transport/lingual motion: Brisk tongue motion Oral residue: Trace residue lining oral structures Location of oral residue : Tongue Initiation of pharyngeal swallow : Posterior angle of the ramus  Pharyngeal Impairment Domain: Pharyngeal Impairment Domain Soft palate elevation: No bolus between soft palate (SP)/pharyngeal wall (PW) Laryngeal elevation: Complete superior movement of thyroid cartilage with complete approximation of arytenoids to epiglottic petiole Anterior hyoid excursion: Complete anterior movement Epiglottic movement: Partial inversion Laryngeal vestibule closure: Incomplete, narrow column air/contrast in laryngeal vestibule Pharyngeal stripping wave : Present - diminished Pharyngoesophageal segment opening: Partial  distention/partial duration, partial obstruction of flow Tongue base retraction: Narrow column of contrast or air between tongue base and PPW Pharyngeal residue: Collection of residue within or on pharyngeal structures Location of pharyngeal residue: Valleculae; Pyriform sinuses  Esophageal Impairment Domain: No data recorded Pill: No data recorded Penetration/Aspiration Scale Score: No data recorded Compensatory Strategies: Compensatory Strategies Compensatory strategies: Yes Straw: Ineffective Chin tuck: Effective (may have helped improved bolus flow and airway protection marginally) Effective Chin Tuck: Thin liquid (Level 0); Puree   General Information: Caregiver present: Yes  Diet Prior to this Study: Regular; Thin liquids (Level 0)   Temperature : Normal   No data recorded  Supplemental O2: Nasal cannula   History of Recent Intubation: No  Behavior/Cognition: Alert; Cooperative Self-Feeding Abilities: Able to self-feed Baseline vocal quality/speech: Normal Volitional Cough: Able to elicit Volitional Swallow: Able to elicit No data recorded Goal Planning: Prognosis for improved oropharyngeal function: Good No data recorded No data recorded Patient/Family Stated Goal: "My neurologist said you have to get this done" (speaking of MBS swallow test) No data recorded Pain: Pain Assessment Pain Assessment: No/denies pain Faces Pain Scale: 4 Pain Location: R shoulder, L arm Pain Descriptors / Indicators: Discomfort; Guarding Pain Intervention(s): Limited activity within patient's tolerance; Monitored during session; Repositioned End of Session: Start Time:SLP Start Time (ACUTE ONLY): 0850 Stop Time: SLP Stop Time (ACUTE ONLY): 0930 Time Calculation:SLP Time Calculation (min) (ACUTE ONLY): 40 min Charges: SLP Evaluations $ SLP Speech Visit: 1 Visit SLP Evaluations $BSS Swallow: 1 Procedure $MBS Swallow: 1 Procedure SLP visit diagnosis: SLP Visit Diagnosis: Dysphagia, oropharyngeal phase (R13.12) Past Medical History:  Past Medical History: Diagnosis Date  Cataract   Hypertension   Moderate aortic stenosis   Moderate mitral regurgitation   Persistent atrial fibrillation (HCC)   PMR (polymyalgia rheumatica) (HCC)   Second degree AV block   Sick sinus syndrome (HCC)   Thyroid disease  Past Surgical History: Past Surgical  History: Procedure Laterality Date  ABDOMINAL HYSTERECTOMY    APPENDECTOMY    CESAREAN SECTION    x 3  CHOLECYSTECTOMY    PACEMAKER GENERATOR CHANGE  11/20/2018  [employer] Accolade MRI PPM implanted in [place]   Juan Quam Laurice 11/05/2022, 11:55 AM  DG CHEST PORT 1 VIEW  Result Date: 11/05/2022 CLINICAL DATA:  Shortness of breath EXAM: PORTABLE CHEST 1 VIEW COMPARISON:  Chest radiograph 2 days prior FINDINGS: The left chest wall cardiac device is stable. Cardiomegaly is unchanged. The upper mediastinal contours are stable. There is worsening right middle lobe consolidation. Aeration is otherwise unchanged. There is trace right effusion. There is no significant left effusion. There is no pneumothorax There is no acute osseous abnormality. IMPRESSION: Worsening right middle lobe consolidation which may reflect atelectasis or pneumonia and trace right effusion. Electronically Signed   By: Valetta Mole M.D.   On: 11/05/2022 07:21    Scheduled Meds:  apixaban  2.5 mg Oral BID   digoxin  125 mcg Oral Daily   ipratropium  0.5 mg Nebulization Q6H   levalbuterol  0.63 mg Nebulization Q6H   levothyroxine  112 mcg Oral QAC breakfast   metoprolol succinate  50 mg Oral Daily   mouth rinse  15 mL Mouth Rinse 4 times per day   phosphorus  500 mg Oral BID   potassium chloride  40 mEq Oral Once   predniSONE  1 mg Oral Daily   Continuous Infusions:  ampicillin-sulbactam (UNASYN) IV Stopped (11/06/22 0535)    LOS: 2 days   Raiford Noble, DO [facility] Available via Epic secure chat 7am-7pm After these hours, please refer to coverage  provider listed on [email or web address] 11/06/2022, 8:19 AM

## 2022-11-06 NOTE — Plan of Care (Signed)

## 2022-11-06 NOTE — Progress Notes (Signed)
Speech Language Pathology Treatment: Dysphagia  Patient Details Name: Deanna Rivera MRN: [id number] DOB: 24-Apr-1933 Today's Date: 11/06/2022 Time: [id number] SLP Time Calculation (min) (ACUTE ONLY): 49 min  Assessment / Plan / Recommendation Clinical Impression  Pt was seen for dysphagia therapy after yesterday's MBS. She had difficulty recalling results and recommendations; we reviewed concerns for aspiration of liquids and poor transport of solids through her throat.  We discussed that she has likely been intermittently aspirating for some time and that her overall weakness has exacerbated her swallowing difficulty. She may demonstrate some improvements as she gets stronger in general. A chin tuck may help marginally with airway protection and bolus flow. Strengthening/exercise may offer some benefit.  It is more likely that dysphagia will wax and wane and the adverse consequences of aspiration will recur at some point in the future.  Today, she required intermittent cues to tuck chin; continued to clear her throat intermittently with all POs.  Spoke with [name]'s son, Lennette Bihari, via phone and reviewed all of the above. He verbalized understanding. Recommended consideration of a Palliative Medicine consult to help with decision-making about her goals in the context of her medical issues. He stated that his mother associates PM with hospice and may be reluctant to talk with them.    SLP will continue to follow for dysphagia while admitted.     HPI HPI: Patient is an 89 y.o female with PMH: HTN, a-fib, hypothyroidism, inclusion body myositis, recent influenza infection. She presented to the hospital on 11/03/22 with generalized weakness and constipation. CT was negative for acute intracranial abnormality. CT cervical spine 09/04/21: Diffuse disc space narrowing and spurring. CT soft tissue neck 09/12/22: Pharynx and larynx: Epiglottis is normal in appearance. Bilateral tonsils are normal in size.  Bilateral vocal cords are symmetric in appearance.  Patient has reported to PCP that food gets stuck in her throat. her husband has had to give her the Heimlich in the past.. She also reports that she has to clear her throat often in order to get food down. Her husband concurs, she has had difficulty swallowing for several years but might be getting worse. No history of aspiration pneumonia. Of note, a modified barium swallow study was recommended in the past but patient did not get this done. During an OP appointment with her neurologist, he recommended she proceed with this swallow test.      SLP Plan  Continue with current plan of care      Recommendations for follow up therapy are one component of a multi-disciplinary discharge planning process, led by the attending physician.  Recommendations may be updated based on patient status, additional functional criteria and insurance authorization.    Recommendations  Diet recommendations: Regular;Thin liquid Liquids provided via: Cup;Straw Medication Administration: Crushed with puree Supervision: Patient able to self feed Compensations: Small sips/bites;Chin tuck Postural Changes and/or Swallow Maneuvers: Seated upright 90 degrees                  Oral care BID     Dysphagia, oropharyngeal phase (R13.12)     Continue with current plan of care   Andersen Iorio L. Tivis Ringer, MA CCC/SLP Clinical Specialist - Acute Care SLP Acute Rehabilitation Services Office number [phone]   Juan Quam Laurice  11/06/2022, 11:51 AM

## 2022-11-06 NOTE — TOC Progression Note (Signed)
Transition of Care [facility]) - Progression Note    Patient Details  Name: Deanna Rivera MRN: [id number] Date of Birth: 05-Mar-1933  Transition [facility] CM/SW Contact  Jinger Neighbors, [place] Phone Number: 11/06/2022, 9:50 AM  Clinical Narrative:     CSW reviewed bed offers and made pt aware that [facility], pt's preference, extended a bed offer. Pt reports concerns of being aware from her husband 14-20 days, as they both live at [facility].  CSW contacted Lennette Bihari, pt's son, to make him aware and will keep him updated.   Expected Discharge Plan: [place] Barriers to Discharge: Continued Medical Work up  Expected Discharge Plan and Services In-house Referral: Clinical Social Work     Living arrangements for the past 2 months: [place] ([facility])                                       Social Determinants of Health (SDOH) Interventions SDOH Screenings   Food Insecurity: No Food Insecurity (11/04/2022)  Housing: Low Risk  (11/04/2022)  Transportation Needs: No Transportation Needs (11/04/2022)  Utilities: Not At Risk (11/04/2022)  Depression (PHQ2-9): High Risk (11/02/2022)  Financial Resource Strain: Low Risk  (11/14/2021)  Physical Activity: Inactive (11/14/2021)  Social Connections: Moderately Integrated (11/14/2021)  Stress: Stress Concern Present (11/14/2021)  Tobacco Use: Medium Risk (11/04/2022)    Readmission Risk Interventions     No data to display

## 2022-11-06 NOTE — Progress Notes (Signed)
Mobility Specialist - Progress Note   11/06/22 1142  Mobility  Activity Stood at bedside  Level of Assistance Moderate assist, patient does 50-74%  Assistive Device None  Activity Response Tolerated fair  Mobility Referral Yes  $Mobility charge 1 Mobility   Pt was received in bed and agreeable to mobility. Pt was ModA to stand at EOB. Pt was only able to tolerate a few seconds of standing before needing to sit down. Pt c/o of general fatigue and weakness. Pt was returned to bed with all needs met.   Franki Monte  Mobility Specialist Please contact via Solicitor or Rehab office at [phone]

## 2022-11-06 NOTE — Progress Notes (Addendum)
Initial Nutrition Assessment  DOCUMENTATION CODES:   Severe malnutrition in context of acute illness/injury  INTERVENTION:  - Add Ensure Enlive po TID, each supplement provides 350 kcal and 20 grams of protein.  - Add MVI q day.   - Consider addition of an appetite stimulant.   NUTRITION DIAGNOSIS:   Severe Malnutrition related to acute illness as evidenced by severe muscle depletion, moderate fat depletion, energy intake < or equal to 50% for > or equal to 5 days.  GOAL:   Patient will meet greater than or equal to 90% of their needs  MONITOR:   PO intake, Supplement acceptance  REASON FOR ASSESSMENT:   Consult Assessment of nutrition requirement/status  ASSESSMENT:   89 y.o. female admits related to constipation and weakness. PMH includes: HTN, afib, thyroid disease. Pt is currently receiving medical management related to physical deconditioning.  Meds reviewed: K phos neutral, klor-con, prednisone. Labs reviewed: K low.   Pt reports that she has no appetite. She states that she has not been eating well since admission and for about 10 days PTA. No significant wt loss per record. RD will add Ensures with meals. Pt is planning to discharge to SNF.  RD will continue to monitor PO intakes and POC.   NUTRITION - FOCUSED PHYSICAL EXAM:  Flowsheet Row Most Recent Value  Orbital Region Moderate depletion  Upper Arm Region Moderate depletion  Thoracic and Lumbar Region Unable to assess  Buccal Region Moderate depletion  Temple Region Severe depletion  Clavicle Bone Region Severe depletion  Clavicle and Acromion Bone Region Severe depletion  Scapular Bone Region Unable to assess  Dorsal Hand Moderate depletion  Patellar Region Mild depletion  Anterior Thigh Region Mild depletion  Posterior Calf Region Mild depletion  Edema (RD Assessment) None  Hair Reviewed  Eyes Reviewed  Mouth Reviewed  Skin Reviewed  Nails Reviewed       Diet Order:   Diet Order              Diet regular Room service appropriate? Yes; Fluid consistency: Thin  Diet effective now                   EDUCATION NEEDS:   Not appropriate for education at this time  Skin:  Skin Assessment: Reviewed RN Assessment  Last BM:  3/26  Height:   Ht Readings from Last 1 Encounters:  11/04/22 5\' 4"  (1.626 m)    Weight:   Wt Readings from Last 1 Encounters:  11/06/22 59.4 kg    Ideal Body Weight:     BMI:  Body mass index is 22.48 kg/m.  Estimated Nutritional Needs:   Kcal:  1485-1780 kcals  Protein:  75-90 gm  Fluid:  >/= 1.4 L  Thalia Bloodgood, RD, LDN, CNSC.

## 2022-11-07 ENCOUNTER — Inpatient Hospital Stay (HOSPITAL_COMMUNITY): Payer: Medicare Other

## 2022-11-07 ENCOUNTER — Other Ambulatory Visit (HOSPITAL_COMMUNITY): Payer: Self-pay

## 2022-11-07 DIAGNOSIS — R5381 Other malaise: Secondary | ICD-10-CM | POA: Diagnosis not present

## 2022-11-07 LAB — COMPREHENSIVE METABOLIC PANEL
ALT: 20 U/L (ref 0–44)
AST: 27 U/L (ref 15–41)
Albumin: 2.2 g/dL — ABNORMAL LOW (ref 3.5–5.0)
Alkaline Phosphatase: 43 U/L (ref 38–126)
Anion gap: 7 (ref 5–15)
BUN: 5 mg/dL — ABNORMAL LOW (ref 8–23)
CO2: 29 mmol/L (ref 22–32)
Calcium: 8.8 mg/dL — ABNORMAL LOW (ref 8.9–10.3)
Chloride: 100 mmol/L (ref 98–111)
Creatinine, Ser: <0.3 mg/dL — ABNORMAL LOW (ref 0.44–1.00)
Glucose, Bld: 101 mg/dL — ABNORMAL HIGH (ref 70–99)
Potassium: 3.3 mmol/L — ABNORMAL LOW (ref 3.5–5.1)
Sodium: 136 mmol/L (ref 135–145)
Total Bilirubin: 1.2 mg/dL (ref 0.3–1.2)
Total Protein: 6 g/dL — ABNORMAL LOW (ref 6.5–8.1)

## 2022-11-07 LAB — CBC WITH DIFFERENTIAL/PLATELET
Abs Immature Granulocytes: 0.03 10*3/uL (ref 0.00–0.07)
Basophils Absolute: 0 10*3/uL (ref 0.0–0.1)
Basophils Relative: 0 %
Eosinophils Absolute: 0 10*3/uL (ref 0.0–0.5)
Eosinophils Relative: 0 %
HCT: 37.7 % (ref 36.0–46.0)
Hemoglobin: 12.7 g/dL (ref 12.0–15.0)
Immature Granulocytes: 0 %
Lymphocytes Relative: 20 %
Lymphs Abs: 1.3 10*3/uL (ref 0.7–4.0)
MCH: 31.6 pg (ref 26.0–34.0)
MCHC: 33.7 g/dL (ref 30.0–36.0)
MCV: 93.8 fL (ref 80.0–100.0)
Monocytes Absolute: 0.6 10*3/uL (ref 0.1–1.0)
Monocytes Relative: 10 %
Neutro Abs: 4.6 10*3/uL (ref 1.7–7.7)
Neutrophils Relative %: 70 %
Platelets: 203 10*3/uL (ref 150–400)
RBC: 4.02 MIL/uL (ref 3.87–5.11)
RDW: 12.8 % (ref 11.5–15.5)
WBC: 6.7 10*3/uL (ref 4.0–10.5)
nRBC: 0 % (ref 0.0–0.2)

## 2022-11-07 LAB — PHOSPHORUS: Phosphorus: 2.5 mg/dL (ref 2.5–4.6)

## 2022-11-07 LAB — MAGNESIUM: Magnesium: 1.9 mg/dL (ref 1.7–2.4)

## 2022-11-07 MED ORDER — ENSURE ENLIVE PO LIQD: 237.0000 mL | Freq: Three times a day (TID) | ORAL | 12 refills | Status: DC

## 2022-11-07 MED ORDER — APIXABAN 2.5 MG PO TABS
2.5000 mg | ORAL_TABLET | Freq: Two times a day (BID) | ORAL | 0 refills | Status: DC
  Filled 2022-11-07: qty 60, 30d supply, fill #0

## 2022-11-07 MED ORDER — POLYETHYLENE GLYCOL 3350 17 G PO PACK: 17.0000 g | PACK | Freq: Every day | ORAL | Status: DC | PRN

## 2022-11-07 MED ORDER — AMOXICILLIN-POT CLAVULANATE 875-125 MG PO TABS: 1.0000 | ORAL_TABLET | Freq: Two times a day (BID) | ORAL | 0 refills | Status: AC

## 2022-11-07 MED ORDER — ADULT MULTIVITAMIN W/MINERALS CH: 1.0000 | ORAL_TABLET | Freq: Every day | ORAL | Status: DC

## 2022-11-07 NOTE — TOC Transition Note (Signed)
Transition of Care [facility]) - CM/SW Discharge Note   Patient Details  Name: Deanna Rivera MRN: [id number] Date of Birth: [birth year]  [facility] CM/SW Contact:  Jinger Neighbors, LCSW Phone Number: 11/07/2022, 1:18 PM   Clinical Narrative:     Pt received Eloquise from pharmacy. Call to report number provided to nurse. Ptar has been called. CSW called pt's son, Lennette Bihari to make him aware of d/c.   Final next level of care: Skilled Nursing Facility Barriers to Discharge: No Barriers Identified   Patient Goals and CMS Choice CMS Medicare.gov Compare Post Acute Care list provided to:: Patient Choice offered to / list presented to : Patient  Discharge Placement                Patient chooses bed at: [facility] Patient to be transferred to facility by: [facility] Name of family member notified: Lennette Bihari Patient and family notified of of transfer: 11/07/22  Discharge Plan and Services Additional resources added to the After Visit Summary for   In-house Referral: Clinical Social Work                                   Social Determinants of Health (SDOH) Interventions SDOH Screenings   Food Insecurity: No Food Insecurity (11/04/2022)  Housing: Low Risk  (11/04/2022)  Transportation Needs: No Transportation Needs (11/04/2022)  Utilities: Not At Risk (11/04/2022)  Depression (PHQ2-9): High Risk (11/02/2022)  Financial Resource Strain: Low Risk  (11/14/2021)  Physical Activity: Inactive (11/14/2021)  Social Connections: Moderately Integrated (11/14/2021)  Stress: Stress Concern Present (11/14/2021)  Tobacco Use: Medium Risk (11/04/2022)     Readmission Risk Interventions     No data to display

## 2022-11-07 NOTE — Care Plan (Addendum)
Patient AxOx4 today, pleasant, waiting to be transferred to SNF.   1302 I called twice to give report at [id number] and I let a voicemail to the nurse supervisor with # to call back.  1624 Report given to the receiving facility. Talked with Denman George, RN

## 2022-11-07 NOTE — Progress Notes (Signed)
Mobility Specialist - Progress Note   11/07/22 1412  Mobility  Activity Stood at bedside  Level of Assistance Moderate assist, patient does 50-74%  Assistive Device Front wheel walker  Activity Response Tolerated well  Mobility Referral Yes  $Mobility charge 1 Mobility   Pt was received in bed and agreeable to mobility. Pt was ModA for bed mobility and standing EOB. Pt was able to tolerate standing for short period of time before requesting to sit. Pt was returned to bed with all needs met.   Deanna Rivera  Mobility Specialist Please contact via Solicitor or Rehab office at [phone]

## 2022-11-07 NOTE — Discharge Summary (Signed)
Physician Discharge Summary   Patient: Deanna Rivera MRN: [id number] DOB: 07-16-1933  Admit date:     11/03/2022  Discharge date: 11/07/22  Discharge Physician: Patrecia Pour   PCP: [name], MD   Recommendations at discharge:  Follow up with PCP in 1-2 weeks Recheck CBC, BMP in next week Monitor BP Follow up with rheumatology/neurology after discharge  Discharge Diagnoses: Principal Problem:   Physical deconditioning Active Problems:   (HFpEF) heart failure with preserved ejection fraction (HCC)   Benign essential hypertension   Persistent atrial fibrillation (HCC)   Inclusion body myositis   Atelectasis, bilateral   Hypokalemia   Hyponatremia   Hypothyroidism   Protein-calorie malnutrition, severe   Hospital Course: The patient is a elderly 89 year old chronically ill-appearing Caucasian female with a past medical history significant for but limited to heart block status post pacemaker placement, permanent atrial fibrillation on anticoagulation with Eliquis, hypothyroidism, inclusion body myositis, hypertension, recent influenza A infection who presented to the emergency department for constipation and progressively worsening weakness.  She had developed a cough, fatigue, nausea, vomiting and loss of appetite around 10/26/2022 and was diagnosed with influenza A.  While her nausea, vomiting and loss of appetite have resolved her cough has been slowly improving but she has suffered significantly worsened generalized weakness to the point where she is unable to get out of the bed for the last few days.  She been on ambulatory prior to her recent illness and has a history of muscle weakness going back to the 1970s when she was diagnosed with inclusion body myositis in [place].  She saw a local rheumatologist in 2022 who appeared to agree with the diagnosis and has progressive weakness over the years that is attributed to this.   In arrival to the ED she is found to be afebrile  and saturating mid 90s on room air and EKG demonstrated atrial fibrillation.  Head CT was negative for any acute intracranial abnormalities and CT scan of the abdomen pelvis was negative for any acute intra-abdominal or pelvic abnormalities but was notable for small right pleural effusion and atelectasis involving right mid right lower and left lower lobes.  Labs were notable for potassium of 3.1 and albumin of 2.9 WBC and lactic acid were normal.  In the ED blood cultures obtained and she was given 1 L of lactated Ringer's as well as 10 mEq of IV potassium and 1 g of IV Rocephin.  She was placed on 2 L supplemental oxygen and now found to have a RML and RLL PNA and will start on IV Unasyn.   SLP evaluated and patient was taken for an MBS and she is noted to have mild to moderate primary pharyngeal dysphagia is likely chronic in nature and related to other degenerative changes and spurring in the cervical vertebra as well as generalized weakness and oral consent was largely functional.  She had a markedly reduced laryngeal closure tongue base retraction and epiglottic movement of pharyngeal esophageal segment opening.  SLP feels that she has been intermittently aspirating for some time and overall weakness has been exacerbated her swallowing difficulty and SLP feels like her dysphagia will wax and wane and the adverse consequences of aspiration will recur at some point in the future.   We will Continue to treat pneumonia and trying to the patient to cough up her sputum and initiated flutter valve and incentive spirometry she will need to go to SNF for rehabilitative efforts  Assessment and Plan: General Weakness  and Physical Deconditioning: Patient with progressive weakness over decades attributed to inclusion body myositis and possible PMR presents with increased weakness/unable to get out of bed while recovering from recent influenza and found to have pneumonia.  - No acute findings on head CT and no  definite focal deficit on exam; TSH was normal a few days ago  - Suspect this is d/t deconditioning after recent influenza with underlying inclusion body myositis  - Checked CK and was 61, ESR was 58, CRP was 6.5, Vitamin B12 Level was 1516.  - Continue PT/OT efforts at SNF.   Atelectasis and Right Middle Lobe and Lower Lobe PNA, suspected to be due to aspiration, recent influenza A: - No hypoxia definitively, and is nonlabored and > 95% on room air here.  - Complete 5 days of Tx with 2 days unasyn (given) and 3 more days of augmentin (prescribed).   - Continue incentive spirometry and flutter valve.  - Continue prn albuterol, though no wheezing noted.   Dysphagia: SLP consulted and MBS done and patient intermittently aspirating. SLP recommending regular diet with thin liquids with aspiration precaution and maneuvers.  - Aspiration precautions very important, to maximize po intake, unrestricted diet.    Atrial Fibrillation, SSS: s/p PPM placed 2013, generator change 11/20/2018 ([employer] MRI PPM implanted in [place]).   - Continue digoxin, metoprolol, eliquis (dosed 2.5mg )    Inclusion Body Myositis, Polymyalgia Rheumatica    - Continue low-dose prednisone 1 mg po daily  - Follow-up with rheumatology outpatient and neurology as planned     Hypertension  - Continue metoprolol, hold HCTZ.    Hypothyroidism: Recent TSH was 0.510, free T4 was 1.41 and T3 free serum was 0.9 - Continue levothyroxine 112 mcg p.o. daily   Chronic HFpEF  - Appeared hypovolemic initially, given IVF, now euvolemic, will not plan to restart thiazide diuretic.    Hyponatremia: Resolved.  - HCTZ was held and pt is normotensive, so will not plan to restart this.    Hypophosphatemia: Resolved.    Constipation, improved -Continue with bowel regimen   Hypokalemia: Supplemented, will continue daily supplement and recommend monitoring. Aim to keep 4.0 or greater.  Hypomagnesemia: Supplemented  and resolved.    Hypoalbuminemia - Supp protein, monitor intermittently    Severe Malnutrition:  - Supplement protein - Consider appetite stimulant if not taking better po after discharge.   Glaucoma: Continue gtt's  Consultants: None Procedures performed: MBS  Disposition: Skilled nursing facility Diet recommendation: Unrestricted with aspiration precautions DISCHARGE MEDICATION: Allergies as of 11/07/2022       Reactions   Shellfish-derived Products Shortness Of Breath, Other (See Comments)   Lips go numb   Epinephrine Other (See Comments), Hypertension   "Makes me feel wired"   Monosodium Glutamate Other (See Comments)   other   Monosodium Glutamate Hypertension, Other (See Comments)   "Makes me feel badly" Other Reaction(s): Other (See Comments) other, other   Ciprofloxacin Other (See Comments)   Allergic reaction not recalled Other Reaction(s): Other (See Comments) Other reaction(s): Intolerance   Dronedarone    Other Reaction(s): Other (See Comments) Leg swelling, Leg swelling   Erythromycin Other (See Comments)   Allergic reaction not recalled   Sulfa Antibiotics Other (See Comments)   Allergic reaction not recalled   Sulfamethoxazole-trimethoprim    Tetracyclines & Related Other (See Comments)   Allergic reaction not recalled        Medication List     STOP taking these medications  cefdinir 300 MG capsule Commonly known as: OMNICEF   hydrochlorothiazide 25 MG tablet Commonly known as: HYDRODIURIL       TAKE these medications    acetaminophen 500 MG tablet Commonly known as: TYLENOL Take 500 mg by mouth every 6 (six) hours as needed for mild pain or headache. What changed: Another medication with the same name was removed. Continue taking this medication, and follow the directions you see here.   albuterol 108 (90 Base) MCG/ACT inhaler Commonly known as: VENTOLIN HFA Inhale 2 puffs into the lungs every 6 (six) hours as needed for wheezing  or shortness of breath.   amoxicillin-clavulanate 875-125 MG tablet Commonly known as: AUGMENTIN Take 1 tablet by mouth 2 (two) times daily for 3 days.   apixaban 2.5 MG Tabs tablet Commonly known as: Eliquis Take 1 tablet (2.5 mg total) by mouth 2 (two) times daily.   digoxin 0.125 MG tablet Commonly known as: LANOXIN TAKE 1 TABLET BY MOUTH DAILY   dorzolamide-timolol 2-0.5 % ophthalmic solution Commonly known as: COSOPT Place 1 drop into both eyes 2 (two) times daily.   feeding supplement Liqd Take 237 mLs by mouth 3 (three) times daily between meals.   guaiFENesin-dextromethorphan 100-10 MG/5ML syrup Commonly known as: ROBITUSSIN DM Take 5 mLs by mouth every 4 (four) hours as needed for cough.   latanoprost 0.005 % ophthalmic solution Commonly known as: XALATAN Place 1 drop into both eyes at bedtime.   levothyroxine 112 MCG tablet Commonly known as: SYNTHROID TAKE 1 TABLET BY MOUTH DAILY  BEFORE BREAKFAST   metoprolol succinate 50 MG 24 hr tablet Commonly known as: TOPROL-XL TAKE 1 TABLET BY MOUTH DAILY   multivitamin with minerals Tabs tablet Take 1 tablet by mouth daily. Start taking on: November 08, 2022   ondansetron 4 MG tablet Commonly known as: ZOFRAN Take 1 tablet (4 mg total) by mouth every 8 (eight) hours as needed for nausea or vomiting.   polyethylene glycol 17 g packet Commonly known as: MIRALAX / GLYCOLAX Take 17 g by mouth daily as needed for severe constipation.   potassium chloride 10 MEQ tablet Commonly known as: Klor-Con 10 Take 1 tablet (10 mEq total) by mouth daily.   predniSONE 1 MG tablet Commonly known as: DELTASONE TAKE 1 TABLET BY MOUTH DAILY What changed: when to take this   Vitamin D3 25 MCG (1000 UT) capsule Generic drug: Cholecalciferol Take 1 capsule by mouth daily.        Discharge Exam: Filed Weights   11/03/22 2147 11/04/22 0515 11/06/22 [id number]  Weight: 54 kg 56.7 kg 59.4 kg  BP 132/65 (BP Location: Right Arm)    Pulse 60   Temp (!) 97.4 F (36.3 C) (Oral)   Resp 17   Ht 5\' 4"  (1.626 m)   Wt 59.4 kg   SpO2 96%   BMI 22.48 kg/m   Elderly, frail, pleasant in no distress, ate about 50% breakfast but all the food here tastes the same.  Diminished at bases without wheezes. Coarse that improves with coughing.  Alert, oriented, no focal deficits.   Condition at discharge: stable  The results of significant diagnostics from this hospitalization (including imaging, microbiology, ancillary and laboratory) are listed below for reference.   Imaging Studies: DG CHEST PORT 1 VIEW  Result Date: 11/07/2022 CLINICAL DATA:  Constipation EXAM: PORTABLE CHEST 1 VIEW COMPARISON:  Chest radiograph 11/05/2022 FINDINGS: Left-sided dual lead cardiac device with unchanged lead positioning. Cardiomegaly. Trace right pleural effusion. No pneumothorax. There are  prominent bilateral interstitial opacities and likely slight pulmonary hyperinflation. There is a persistent airspace opacity in the right infrahilar region, which remains worrisome for infection. No radiographically apparent displaced rib fractures. Visualized upper abdomen is unremarkable. IMPRESSION: 1. Persistent airspace opacity in the right infrahilar region, which remains worrisome for infection. 2. Cardiomegaly with trace right pleural effusion. Electronically Signed   By: Marin Roberts M.D.   On: 11/07/2022 08:02   DG Swallowing Func-Speech Pathology  Result Date: 11/05/2022 Table formatting from the original result was not included. Modified Barium Swallow Study Patient Details Name: Deanna Rivera MRN: [id number] Date of Birth: 19-May-1933 Today's Date: 11/05/2022 HPI/PMH: HPI: Patient is an 89 y.o female with PMH: HTN, a-fib, hypothyroidism, inclusion body myositis, recent influenza infection. She presented to the hospital on 11/03/22 with generalized weakness and constipation. CT was negative for acute intracranial abnormality. CT cervical spine 09/04/21: Diffuse  disc space narrowing and spurring. CT soft tissue neck 09/12/22: Pharynx and larynx: Epiglottis is normal in appearance. Bilateral tonsils are normal in size. Bilateral vocal cords are symmetric in appearance.  Patient has reported to PCP that food gets stuck in her throat. her husband has had to give her the Heimlich in the past.. She also reports that she has to clear her throat often in order to get food down. Her husband concurs, she has had difficulty swallowing for several years but might be getting worse. No history of aspiration pneumonia. Of note, a modified barium swallow study was recommended in the past but patient did not get this done. During an OP appointment with her neurologist, he recommended she proceed with this swallow test. Clinical Impression: Clinical Impression: Ms. Tassinari presents with a mild-moderate primary pharyngeal dysphagia that is likely chronic in nature and related to degenerative changes and spurring in cervical vertebrae as well as generalized weakness.  Oral phase was largely functional.  Pharyngeal phase was marked by reduced laryngeal closure, tongue base retraction, epiglottic movement, and pharyngoesophageal segment opening.  These impairments in the swallow were partially due to appearance of osteophytes which inhibited full inversion of epiglottis over larynx.  There was a prominent CP, leading to material collecting above.  Reduced propulsion through the pharynx/PES was also related to inferred weakness of the base of tongue/pharyngeal wall. There was consistent penetration of thin liquids, often to the level of the vocal folds. Penetration elicited a frequent cough response; there was minimal aspiration.  Solid food residue tended to remain in the valleculae and was difficult to transition.  A chin tuck was trialed, it may have offered marginal benefit in bolus transfer of solids and airway protection with thin liquids. Spent time after the study reviewing imaging in  pt's room and discussing results with pt/husband.  She should try tucking chin with all solids/liquids. Continue current diet (regular solids/thin liquids); Pt and husband will need a lot of reinforcement to recall results and precautions. SLP will follow while admitted. Factors that may increase risk of adverse event in presence of aspiration ([place] 2021): Factors that may increase risk of adverse event in presence of aspiration ([place] 2021): Frail or deconditioned Recommendations/Plan: Swallowing Evaluation Recommendations Swallowing Evaluation Recommendations Recommendations: PO diet PO Diet Recommendation: Regular; Thin liquids (Level 0) Liquid Administration via: Cup; Straw Medication Administration: Whole meds with puree Supervision: Patient able to self-feed Swallowing strategies  : Chin tuck Postural changes: Position pt fully upright for meals Treatment Plan Treatment Plan Treatment recommendations: Therapy as outlined in treatment plan below Follow-up recommendations: -- (  tba) Functional status assessment: Patient has had a recent decline in their functional status and demonstrates the ability to make significant improvements in function in a reasonable and predictable amount of time. Treatment frequency: Min 2x/week Treatment duration: 2 weeks Interventions: Patient/family education; Diet toleration management by SLP; Oropharyngeal exercises Recommendations Recommendations for follow up therapy are one component of a multi-disciplinary discharge planning process, led by the attending physician.  Recommendations may be updated based on patient status, additional functional criteria and insurance authorization. Assessment: Orofacial Exam: Orofacial Exam Oral Cavity: Oral Hygiene: WFL Oral Cavity - Dentition: Adequate natural dentition Oral Motor/Sensory Function: WFL Anatomy: Anatomy: Suspected cervical osteophytes Boluses Administered: No data recorded Oral Impairment Domain: Oral  Impairment Domain Lip Closure: No labial escape Tongue control during bolus hold: Cohesive bolus between tongue to palatal seal Bolus preparation/mastication: Slow prolonged chewing/mashing with complete recollection Bolus transport/lingual motion: Brisk tongue motion Oral residue: Trace residue lining oral structures Location of oral residue : Tongue Initiation of pharyngeal swallow : Posterior angle of the ramus  Pharyngeal Impairment Domain: Pharyngeal Impairment Domain Soft palate elevation: No bolus between soft palate (SP)/pharyngeal wall (PW) Laryngeal elevation: Complete superior movement of thyroid cartilage with complete approximation of arytenoids to epiglottic petiole Anterior hyoid excursion: Complete anterior movement Epiglottic movement: Partial inversion Laryngeal vestibule closure: Incomplete, narrow column air/contrast in laryngeal vestibule Pharyngeal stripping wave : Present - diminished Pharyngoesophageal segment opening: Partial distention/partial duration, partial obstruction of flow Tongue base retraction: Narrow column of contrast or air between tongue base and PPW Pharyngeal residue: Collection of residue within or on pharyngeal structures Location of pharyngeal residue: Valleculae; Pyriform sinuses  Esophageal Impairment Domain: No data recorded Pill: No data recorded Penetration/Aspiration Scale Score: No data recorded Compensatory Strategies: Compensatory Strategies Compensatory strategies: Yes Straw: Ineffective Chin tuck: Effective (may have helped improved bolus flow and airway protection marginally) Effective Chin Tuck: Thin liquid (Level 0); Puree   General Information: Caregiver present: Yes  Diet Prior to this Study: Regular; Thin liquids (Level 0)   Temperature : Normal   No data recorded  Supplemental O2: Nasal cannula   History of Recent Intubation: No  Behavior/Cognition: Alert; Cooperative Self-Feeding Abilities: Able to self-feed Baseline vocal quality/speech: Normal  Volitional Cough: Able to elicit Volitional Swallow: Able to elicit No data recorded Goal Planning: Prognosis for improved oropharyngeal function: Good No data recorded No data recorded Patient/Family Stated Goal: "My neurologist said you have to get this done" (speaking of MBS swallow test) No data recorded Pain: Pain Assessment Pain Assessment: No/denies pain Faces Pain Scale: 4 Pain Location: R shoulder, L arm Pain Descriptors / Indicators: Discomfort; Guarding Pain Intervention(s): Limited activity within patient's tolerance; Monitored during session; Repositioned End of Session: Start Time:SLP Start Time (ACUTE ONLY): 0850 Stop Time: SLP Stop Time (ACUTE ONLY): 0930 Time Calculation:SLP Time Calculation (min) (ACUTE ONLY): 40 min Charges: SLP Evaluations $ SLP Speech Visit: 1 Visit SLP Evaluations $BSS Swallow: 1 Procedure $MBS Swallow: 1 Procedure SLP visit diagnosis: SLP Visit Diagnosis: Dysphagia, oropharyngeal phase (R13.12) Past Medical History: Past Medical History: Diagnosis Date  Cataract   Hypertension   Moderate aortic stenosis   Moderate mitral regurgitation   Persistent atrial fibrillation (HCC)   PMR (polymyalgia rheumatica) (HCC)   Second degree AV block   Sick sinus syndrome (HCC)   Thyroid disease  Past Surgical History: Past Surgical History: Procedure Laterality Date  ABDOMINAL HYSTERECTOMY    APPENDECTOMY    CESAREAN SECTION    x 3  CHOLECYSTECTOMY  PACEMAKER GENERATOR CHANGE  11/20/2018  [employer] Acc[employer]ade MRI PPM implanted in [place] CYST EXCISION   Juan Quam Laurice 11/05/2022, 11:55 AM  DG CHEST PORT 1 VIEW  Result Date: 11/05/2022 CLINICAL DATA:  Shortness of breath EXAM: PORTABLE CHEST 1 VIEW COMPARISON:  Chest radiograph 2 days prior FINDINGS: The left chest wall cardiac device is stable. Cardiomegaly is unchanged. The upper mediastinal contours are stable. There is worsening right middle lobe consolidation. Aeration is otherwise  unchanged. There is trace right effusion. There is no significant left effusion. There is no pneumothorax There is no acute osseous abnormality. IMPRESSION: Worsening right middle lobe consolidation which may reflect atelectasis or pneumonia and trace right effusion. Electronically Signed   By: Valetta Mole M.D.   On: 11/05/2022 07:21   CT ABDOMEN PELVIS W CONTRAST  Result Date: 11/04/2022 CLINICAL DATA:  Abdominal pain EXAM: CT ABDOMEN AND PELVIS WITH CONTRAST TECHNIQUE: Multidetector CT imaging of the abdomen and pelvis was performed using the standard protocol following bolus administration of intravenous contrast. RADIATION DOSE REDUCTION: This exam was performed according to the departmental dose-optimization program which includes automated exposure control, adjustment of the mA and/or kV according to patient size and/or use of iterative reconstruction technique. CONTRAST:  57mL OMNIPAQUE IOHEXOL 350 MG/ML SOLN COMPARISON:  03/08/2020 FINDINGS: Lower chest: Small right-sided pleural effusion is noted. Atelectatic changes are noted in the right middle and lower lobes as well as in the left lower lobe. Cardiac shadow is enlarged. Hepatobiliary: No focal liver abnormality is seen. Status post cholecystectomy. No biliary dilatation. Pancreas: Unremarkable. No pancreatic ductal dilatation or surrounding inflammatory changes. Spleen: Normal in size without focal abnormality. Adrenals/Urinary Tract: Adrenal glands are within normal limits. Kidneys demonstrate a normal enhancement pattern. Normal excretion is noted bilaterally. The bladder is well distended. Stomach/Bowel: Diverticular disease of the colon is noted. No evidence of diverticulitis is seen. The appendix has been surgically removed. Small bowel and stomach are within normal limits. Vascular/Lymphatic: Aortic atherosclerosis. No enlarged abdominal or pelvic lymph nodes. Reproductive: Status post hysterectomy. No adnexal masses. Other: No abdominal wall  hernia or abnormality. No abdominopelvic ascites. Musculoskeletal: Mild degenerative changes of lumbar spine are noted. IMPRESSION: Diverticulosis without diverticulitis. Bilateral atelectatic changes as described with associated right-sided pleural effusion. Electronically Signed   By: Inez Catalina M.D.   On: 11/04/2022 02:17   CT Head Wo Contrast  Result Date: 11/04/2022 CLINICAL DATA:  Altered mental status EXAM: CT HEAD WITHOUT CONTRAST TECHNIQUE: Contiguous axial images were obtained from the base of the skull through the vertex without intravenous contrast. RADIATION DOSE REDUCTION: This exam was performed according to the departmental dose-optimization program which includes automated exposure control, adjustment of the mA and/or kV according to patient size and/or use of iterative reconstruction technique. COMPARISON:  09/04/2021 FINDINGS: Brain: No evidence of acute infarction, hemorrhage, hydrocephalus, extra-axial collection or mass lesion/mass effect. Small lacunar infarct is noted in the left basal ganglia. Vascular: No hyperdense vessel or unexpected calcification. Skull: Normal. Negative for fracture or focal lesion. Sinuses/Orbits: No acute finding. Other: None. IMPRESSION: No acute intracranial abnormality noted. Chronic ischemic changes are noted. Electronically Signed   By: Inez Catalina M.D.   On: 11/04/2022 02:14   DG Chest 2 View  Result Date: 11/03/2022 CLINICAL DATA:  Shortness of breath and hypoxia, initial encounter EXAM: CHEST - 2 VIEW COMPARISON:  10/29/2022 FINDINGS: Cardiac shadow is enlarged but stable. Pacing device is again seen. Increasing right basilar infiltrate is  noted. No effusion or pneumothorax is seen. IMPRESSION: Increasing right lower lobe and right middle lobe infiltrate. Electronically Signed   By: Inez Catalina M.D.   On: 11/03/2022 23:38   DG Chest Port 1 View  Result Date: 10/29/2022 CLINICAL DATA:  Cough, difficulty breathing, weakness for 3 days, slid out  of bed and could not get up, productive cough EXAM: PORTABLE CHEST 1 VIEW COMPARISON:  Portable exam 0949 hours compared to 05/11/2022 FINDINGS: LEFT subclavian sequential transvenous pacemaker leads project at RIGHT atrium and RIGHT ventricle. Enlargement of cardiac silhouette. Mediastinal contours and pulmonary vascularity normal. Mild chronic RIGHT basilar atelectasis and slight accentuation of LEFT basilar markings. Lungs otherwise clear. No pulmonary infiltrate, pleural effusion, or pneumothorax. Bones demineralized. IMPRESSION: Enlargement of cardiac silhouette post pacemaker. Chronic RIGHT basilar atelectasis. Electronically Signed   By: Lavonia Dana M.D.   On: 10/29/2022 10:44   ECHOCARDIOGRAM COMPLETE  Result Date: 10/16/2022    ECHOCARDIOGRAM REPORT   Patient Name:   Deanna Rivera Date of Exam: 10/16/2022 Medical Rec #:  [id number]      Height:       64.0 in Accession #:    [id number]     Weight:       127.0 lb Date of Birth:  [birth year]      BSA:          1.613 m Patient Age:    64 years       BP:           151/70 mmHg Patient Gender: F              HR:           52 bpm. Exam Location:  [place] Procedure: 2D Echo, Color Doppler and Cardiac Doppler Indications:    Congestive Heart Failure- Acute Diastolic [id number]  History:        Patient has prior history of Echocardiogram examinations, most                 recent 01/09/2022. Pacemaker, Pulmonary HTN, Arrythmias:Atrial                 Fibrillation; Risk Factors:Hypertension.  Sonographer:    Mikki Santee RDCS Referring Phys: [place]  1. Left ventricular ejection fraction, by estimation, is 65 to 70%. The left ventricle has normal function. The left ventricle has no regional wall motion abnormalities. Left ventricular diastolic function could not be evaluated.  2. Right ventricular systolic function is normal. The right ventricular size is normal. There is severely elevated pulmonary artery systolic pressure. The estimated  right ventricular systolic pressure is 99991111 mmHg.  3. Left atrial size was severely dilated.  4. Right atrial size was severely dilated.  5. The mitral valve is normal in structure. Mild to moderate mitral valve regurgitation.  6. Tricuspid valve regurgitation is moderate to severe.  7. The aortic valve is tricuspid. There is mild calcification of the aortic valve. There is moderate thickening of the aortic valve. Aortic valve regurgitation is trivial. Moderate aortic valve stenosis.  8. The inferior vena cava is dilated in size with <50% respiratory variability, suggesting right atrial pressure of 15 mmHg. Comparison(s): Prior images reviewed side by side. The tricuspid insufficiency appears slightly worse. Conclusion(s)/Recommendation(s): Findings are consistent with moderate paradoxical low-flow, low-gradient calcific aortic stenosis. However, the aortic stenosis does not appear severe enough to explain this degree of pulmonary artery hypertension. Consider an infiltrative cardiomyopathy, such as amyloidosis. FINDINGS  Left Ventricle:  Left ventricular ejection fraction, by estimation, is 65 to 70%. The left ventricle has normal function. The left ventricle has no regional wall motion abnormalities. The left ventricular internal cavity size was normal in size. There is  no left ventricular hypertrophy. Abnormal (paradoxical) septal motion, consistent with left bundle branch block. Left ventricular diastolic function could not be evaluated due to atrial fibrillation. Left ventricular diastolic function could not be evaluated. Right Ventricle: The right ventricular size is normal. No increase in right ventricular wall thickness. Right ventricular systolic function is normal. There is severely elevated pulmonary artery systolic pressure. The tricuspid regurgitant velocity is 3.43 m/s, and with an assumed right atrial pressure of 15 mmHg, the estimated right ventricular systolic pressure is 99991111 mmHg. Left Atrium:  Left atrial size was severely dilated. Right Atrium: Right atrial size was severely dilated. Pericardium: There is no evidence of pericardial effusion. Mitral Valve: The mitral valve is normal in structure. Mild to moderate mitral valve regurgitation, with centrally-directed jet. Tricuspid Valve: The tricuspid valve is normal in structure. Tricuspid valve regurgitation is moderate to severe. Aortic Valve: The aortic valve is tricuspid. There is mild calcification of the aortic valve. There is moderate thickening of the aortic valve. Aortic valve regurgitation is trivial. Moderate aortic stenosis is present. Aortic valve mean gradient measures 10.0 mmHg. Aortic valve peak gradient measures 17.1 mmHg. Aortic valve area, by VTI measures 1.01 cm. Pulmonic Valve: The pulmonic valve was normal in structure. Pulmonic valve regurgitation is mild. Aorta: The aortic root and ascending aorta are structurally normal, with no evidence of dilitation. Venous: The inferior vena cava is dilated in size with less than 50% respiratory variability, suggesting right atrial pressure of 15 mmHg. IAS/Shunts: There is right bowing of the interatrial septum, suggestive of elevated left atrial pressure. No atrial level shunt detected by color flow Doppler. Additional Comments: A device lead is visualized in the right ventricle.  LEFT VENTRICLE PLAX 2D LVIDd:         4.00 cm   Diastology LVIDs:         2.80 cm   LV e' medial:    6.40 cm/s LV PW:         1.00 cm   LV E/e' medial:  13.6 LV IVS:        1.00 cm   LV e' lateral:   9.29 cm/s LVOT diam:     2.04 cm   LV E/e' lateral: 9.4 LV SV:         44 LV SV Index:   27 LVOT Area:     3.27 cm  RIGHT VENTRICLE RV Basal diam:  3.60 cm RV Mid diam:    3.10 cm RV S prime:     11.90 cm/s TAPSE (M-mode): 2.7 cm LEFT ATRIUM             Index        RIGHT ATRIUM           Index LA diam:        4.30 cm 2.67 cm/m   RA Area:     23.60 cm LA Vol (A2C):   65.2 ml 40.42 ml/m  RA Volume:   63.50 ml  39.37  ml/m LA Vol (A4C):   65.0 ml 40.30 ml/m LA Biplane Vol: 66.5 ml 41.23 ml/m  AORTIC VALVE AV Area (Vmax):    1.02 cm AV Area (Vmean):   0.99 cm AV Area (VTI):     1.01 cm AV Vmax:  207.00 cm/s AV Vmean:          147.000 cm/s AV VTI:            0.436 m AV Peak Grad:      17.1 mmHg AV Mean Grad:      10.0 mmHg LVOT Vmax:         64.40 cm/s LVOT Vmean:        44.300 cm/s LVOT VTI:          0.135 m LVOT/AV VTI ratio: 0.31  AORTA Ao Root diam: 3.20 cm Ao Asc diam:  3.40 cm MITRAL VALVE                  TRICUSPID VALVE MV Area (PHT): 4.15 cm       TR Peak grad:   47.1 mmHg MV Decel Time: 183 msec       TR Vmax:        343.00 cm/s MR Peak grad:    129.0 mmHg MR Mean grad:    78.0 mmHg    SHUNTS MR Vmax:         568.00 cm/s  Systemic VTI:  0.14 m MR Vmean:        418.0 cm/s   Systemic Diam: 2.04 cm MR PISA:         1.57 cm MR PISA Eff ROA: 9 mm MR PISA Radius:  0.50 cm MV E velocity: 87.00 cm/s MV A velocity: 36.70 cm/s MV E/A ratio:  2.37 Mihai Croitoru MD Electronically signed by Sanda Klein MD Signature Date/Time: 10/16/2022/1:41:17 PM    Final    VAS [place] LOWER EXTREMITY VENOUS REFLUX  Result Date: 10/10/2022  Lower Venous Reflux Study Patient Name:  Deanna Rivera  Date of Exam:   10/09/2022 Medical Rec #: [id number]       Accession #:    [id number] Date of Birth: Apr 22, 1933       Patient Gender: F Patient Age:   89 years Exam Location:  [facility] Procedure:      VAS [place] LOWER EXTREMITY VENOUS REFLUX Referring Phys: Jamelle Haring --------------------------------------------------------------------------------  Indications: Edema.  Limitations: Poor ultrasound/tissue interface. Comparison Study: No prior study Performing Technologist: Maudry Mayhew MHA, RDMS, RVT, RDCS  Examination Guidelines: A complete evaluation includes B-mode imaging, spectral Doppler, color Doppler, and power Doppler as needed of all accessible portions of each vessel. Bilateral testing is considered an  integral part of a complete examination. Limited examinations for reoccurring indications may be performed as noted. The reflux portion of the exam is performed with the patient in reverse Trendelenburg. Significant venous reflux is defined as >500 ms in the superficial venous system, and >1 second in the deep venous system.  +--------------+---------+------+-----------+------------+----------------+ LEFT          Reflux NoRefluxReflux TimeDiameter cmsComments                                 Yes                                          +--------------+---------+------+-----------+------------+----------------+ CFV           no                                                     +--------------+---------+------+-----------+------------+----------------+  FV mid        no                                                     +--------------+---------+------+-----------+------------+----------------+ Popliteal     no                                                     +--------------+---------+------+-----------+------------+----------------+ GSV at [facility]    no                            0.45                     +--------------+---------+------+-----------+------------+----------------+ GSV prox thigh          yes    >500 ms      0.31                     +--------------+---------+------+-----------+------------+----------------+ GSV mid thigh           yes    >500 ms      0.31                     +--------------+---------+------+-----------+------------+----------------+ GSV dist thigh          yes    >500 ms      0.28                     +--------------+---------+------+-----------+------------+----------------+ GSV at knee             yes    >500 ms      0.36                     +--------------+---------+------+-----------+------------+----------------+ GSV prox calf                                       out of fascia     +--------------+---------+------+-----------+------------+----------------+ SSV Pop Fossa no                            0.13                     +--------------+---------+------+-----------+------------+----------------+ SSV prox calf no                            0.25    chronic thrombus +--------------+---------+------+-----------+------------+----------------+   Summary: Left: - No evidence of deep vein thrombosis seen in the left lower extremity, from the common femoral through the popliteal veins. 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 - The deep venous system is competent. - The great saphenous vein is incompetent. - The small saphenous vein is competent.  *See table(s) above for measurements and observations. Electronically signed by Jamelle Haring on 10/10/2022 at 9:12:31 AM.    Final     Microbiology: Results for orders placed or performed during the  hospital encounter of 11/03/22  Blood culture (routine x 2)     Status: None (Preliminary result)   Collection Time: 11/04/22 12:13 AM   Specimen: BLOOD  Result Value Ref Range Status   Specimen Description BLOOD BLOOD LEFT ARM  Final   Special Requests   Final    BOTTLES DRAWN AEROBIC AND ANAEROBIC Blood Culture adequate volume   Culture   Final    NO GROWTH 3 DAYS Performed at [facility], [address], [place], [place] [address]    Report Status PENDING  Incomplete  Blood culture (routine x 2)     Status: None (Preliminary result)   Collection Time: 11/04/22  5:57 AM   Specimen: BLOOD  Result Value Ref Range Status   Specimen Description BLOOD BLOOD RIGHT HAND  Final   Special Requests   Final    BOTTLES DRAWN AEROBIC AND ANAEROBIC Blood Culture adequate volume   Culture   Final    NO GROWTH 3 DAYS Performed at [facility], [address], [place], [place] [address]    Report Status PENDING  Incomplete     Labs: CBC: Recent Labs  Lab 11/03/22 2350 11/04/22 0019 11/04/22 0445 11/05/22 0448 11/06/22 0349 11/07/22 0359  WBC 8.2  --  7.2 5.3 5.7 6.7  NEUTROABS  --   --   --  3.1 3.8 4.6  HGB 15.1* 15.0 13.5 13.2 13.2 12.7  HCT 43.7 44.0 40.8 39.0 38.5 37.7  MCV 91.4  --  95.1 93.8 92.8 93.8  PLT 207  --  190 199 206 [id number]   Basic Metabolic Panel: Recent Labs  Lab 11/03/22 2350 11/04/22 0019 11/04/22 0445 11/05/22 0448 11/06/22 0349 11/07/22 0359  NA 132* 130* 132* 134* 135 136  K 3.1* 6.1* 3.1* 4.3 3.4* 3.3*  CL 91* 94* 95* 96* 98 100  CO2 26  --  28 30 29 29   GLUCOSE 122* 122* 103* 79 97 101*  BUN 13 18 10  5* 6* 5*  CREATININE 0.34* 0.20* <0.30* 0.36* 0.35* <0.30*  CALCIUM 9.5  --  8.8* 9.1 8.9 8.8*  MG  --   --  1.5* 1.9 1.9 1.9  PHOS  --   --  2.8 2.3* 2.1* 2.5   Liver Function Tests: Recent Labs  Lab 11/03/22 2350 11/05/22 0448 11/06/22 0349 11/07/22 0359  AST 33 28 28 27   ALT 26 22 22 20   ALKPHOS 46 44 46 43  BILITOT 0.9 1.2 0.9 1.2  PROT 7.1 6.4* 6.4* 6.0*  ALBUMIN 2.9* 2.5* 2.4* 2.2*   CBG: No results for input(s): "GLUCAP" in the last 168 hours.  Discharge time spent: greater than 30 minutes.  Signed: Patrecia Pour, MD [facility] 11/07/2022

## 2022-11-08 NOTE — Care Management Important Message (Signed)
Important Message  Patient Details  Name: Deanna Rivera MRN: [id number] Date of Birth: August 31, 1932   Medicare Important Message Given:  Yes  Patient left piror to IM delivery will mail a copy of IM to the patient home address.    [name] 11/08/2022, 10:37 AM

## 2022-11-09 LAB — CULTURE, BLOOD (ROUTINE X 2)
Culture: NO GROWTH
Culture: NO GROWTH
Special Requests: ADEQUATE
Special Requests: ADEQUATE

## 2022-11-20 ENCOUNTER — Encounter (HOSPITAL_COMMUNITY): Payer: Medicare Other | Admitting: Cardiology

## 2022-11-26 ENCOUNTER — Encounter: Payer: Medicare Other | Admitting: Pharmacist

## 2022-11-28 ENCOUNTER — Telehealth: Payer: Self-pay | Admitting: Internal Medicine

## 2022-11-28 DIAGNOSIS — R600 Localized edema: Secondary | ICD-10-CM

## 2022-11-28 NOTE — Addendum Note (Signed)
Addended by: Bennie Dallas C on: 11/28/2022 06:25 PM   Modules accepted: Orders

## 2022-11-28 NOTE — Telephone Encounter (Signed)
Spoke with patient who is currently at [facility] for rehab following a recent hospitalization. Patient states the hydrochlorothiazide was stopped while she was hospitalized. She reports she has swelling in her left leg. Patient is no longer ambulatory.   She want Dr Ladona Ridgel to know that her HCTZ was stopped and ask if she needs to restart it. Advised I would forward her concerns to Dr Ladona Ridgel for review.

## 2022-11-28 NOTE — Telephone Encounter (Signed)
Pt c/o swelling: STAT is pt has developed SOB within 24 hours  How much weight have you gained and in what time span? Not sure   If swelling, where is the swelling located? Left leg swelling   Are you currently taking a fluid pill? No   Are you currently SOB? No   Do you have a log of your daily weights (if so, list)? No   Have you gained 3 pounds in a day or 5 pounds in a week? No sure   Have you traveled recently? No  Patient is at rehab currently and was taken off some medication and would like to discuss this with someone and to discuss swelling.

## 2022-11-28 NOTE — Telephone Encounter (Signed)
Message received from [facility] Triage.   Text message sent to Dr. Ladona Ridgel.  Per Dr. Ladona Ridgel, ok to restart HCTZ, but will need a BMP in 2 Weeks.    Called Pt telephone number, unable to speak with PT or Operator at nursing home.  I left voicemail message for Pt to call me back for new orders from Dr. Ladona Ridgel.    BMP future lab order entered, but will not schedule until I am able to speak with Pt.    I will follow up with Pt on Friday.

## 2022-11-29 ENCOUNTER — Ambulatory Visit: Payer: Medicare Other | Admitting: Family Medicine

## 2022-11-30 NOTE — Telephone Encounter (Signed)
Pt called back per message received on 11/28/2022.    Dr. Ladona Ridgel provided orders via text message, and I called Pt back this morning to communicate them to her.    Pt answered telephone call and stated: Lt Leg edema or swelling had gone down. Provider that visits Laurena Bering was into see her yesterday. Pt stated her BP has been 108/60, and HR between 80- 90 bpm.  Provider, unknown name, advised against HCTZ for right now;  Provider also addressing Lt Leg edema. Pt advised of Dr. Lubertha Basque order to check BMP in 2 weeks;  Pt stated she will let RN staff know what lab was advised, however feedback obtained from this call indicated that healthcare team at [facility] has already addressed all the Pt concerns.    Pt advised to call [facility] with any new or worsening symptoms, but advised to follow Proivder's plan of care at [facility].

## 2022-12-04 DIAGNOSIS — M6281 Muscle weakness (generalized): Secondary | ICD-10-CM

## 2022-12-04 DIAGNOSIS — I5030 Unspecified diastolic (congestive) heart failure: Secondary | ICD-10-CM

## 2022-12-04 DIAGNOSIS — I10 Essential (primary) hypertension: Secondary | ICD-10-CM

## 2022-12-04 DIAGNOSIS — I4819 Other persistent atrial fibrillation: Secondary | ICD-10-CM

## 2022-12-04 DIAGNOSIS — R278 Other lack of coordination: Secondary | ICD-10-CM

## 2022-12-04 DIAGNOSIS — E876 Hypokalemia: Secondary | ICD-10-CM

## 2022-12-04 DIAGNOSIS — E063 Autoimmune thyroiditis: Secondary | ICD-10-CM

## 2022-12-04 DIAGNOSIS — E871 Hypo-osmolality and hyponatremia: Secondary | ICD-10-CM

## 2022-12-04 DIAGNOSIS — E039 Hypothyroidism, unspecified: Secondary | ICD-10-CM

## 2022-12-07 ENCOUNTER — Other Ambulatory Visit: Payer: Self-pay | Admitting: *Deleted

## 2022-12-07 NOTE — Patient Outreach (Signed)
Mrs. Deanna Rivera currently resides in [facility] skilled nursing facility. Screening for potential [facility] care coordination services as benefit of health plan and primary care provider.   Facility site visit to [employer]. Spoke with Mrs. Deanna Rivera and spouse at bedside to explain [facility] care coordination services. Provided [facility] literature and writer's contact information for future reference if needed..   Mrs. Deanna Rivera is from independent living. No identifiable care coordination needs at this time.    Deanna Noble, MSN, RN,BSN [facility] Post Acute Care Coordinator [phone] (Direct dial)

## 2022-12-17 ENCOUNTER — Telehealth: Payer: Self-pay | Admitting: Internal Medicine

## 2022-12-17 ENCOUNTER — Other Ambulatory Visit: Payer: Self-pay | Admitting: *Deleted

## 2022-12-17 ENCOUNTER — Other Ambulatory Visit: Payer: Self-pay

## 2022-12-17 DIAGNOSIS — Z7689 Persons encountering health services in other specified circumstances: Secondary | ICD-10-CM

## 2022-12-17 DIAGNOSIS — Z0189 Encounter for other specified special examinations: Secondary | ICD-10-CM

## 2022-12-17 DIAGNOSIS — M353 Polymyalgia rheumatica: Secondary | ICD-10-CM

## 2022-12-17 NOTE — Telephone Encounter (Signed)
Pt advised to go to ED but refused.  Patient Name First: Deanna Last: Rivera Gender: Female DOB: 1932/12/31 Age: 89 Y 10 M 10 D Return Phone Number: [phone] (Primary) Address: City/ State/ Zip: [place]  [address] Client [facility] - Human resources officer [facility] [employer] Provider Asencion Partridge- MD Contact Type Call Who Is Calling Patient / Member / Family / Caregiver Call Type Triage / Clinical Relationship To Patient Self Return Phone Number [phone] (Primary) Chief Complaint Abdominal Pain Reason for Call Symptomatic / Request for Health Information Initial Comment Caller states they are experiencing diarrhea and abdominal pain. Translation No Nurse Assessment Nurse: Henri Medal, RN, Amy Date/Time (Eastern Time): 12/15/2022 9:11:11 AM Confirm and document reason for call. If symptomatic, describe symptoms. ---Caller states she has diarrhea and abdominal pain. Started yesterday. Rehab for 35 days & came home Thursday. Abd. pain comes & goes in waves & is very severe. Might not come at same time as the diarrhea. Has had diarrhea 7 times & is watery. Does the patient have any new or worsening symptoms? ---Yes Will a triage be completed? ---Yes Related visit to physician within the last 2 weeks? ---No Does the PT have any chronic conditions? (i.e. diabetes, asthma, this includes High risk factors for pregnancy, etc.) ---Yes List chronic conditions. ---Muscle disease Is this a behavioral health or substance abuse call? ---No Guidelines Guideline Title Affirmed Question Affirmed Notes Nurse Date/Time (Eastern Time) Abdominal Pain - Female [1] SEVERE pain AND [2] age > 44 years Lovelace, RN, Amy 12/15/2022 9:14:48 AM Disp. Time Lamount Cohen Time) Disposition Final User 12/15/2022 9:19:37 AM Go to ED Now Yes Lovelace, RN, Amy Final Disposition 12/15/2022 9:19:37 AM Go to ED Now Yes Lovelace, RN,  Amy Caller Disagree/Comply Disagree Caller Understands Yes PreDisposition InappropriateToAsk Care Advice Given Per Guideline GO TO ED NOW: * You need to be seen in the Emergency Department. * Go to the ED at ___________ Hospital. * Leave now. Drive carefully. BRING MEDICINES: * Bring a list of your current medicines when you go to the Emergency Department (ER). ANOTHER ADULT SHOULD DRIVE: * It is better and safer if another adult drives instead of you. NOTHING BY MOUTH: * Do not eat or drink anything for now. CARE ADVICE given per Abdominal Pain - Female (Adult) guideline. Referrals GO TO FACILITY REFUSED

## 2022-12-17 NOTE — Telephone Encounter (Signed)
Spoke with Patient-refused an office visit. Also, requesting Home Health.

## 2022-12-17 NOTE — Patient Outreach (Signed)
Per [facility] Mrs. Huebsch discharged from [facility] skilled nursing facility on 12/13/22. Screening for potential [facility] care coordination services as a benefit of health plan and primary care provider.  Confirmed with Sharia Reeve Designer, industrial/product. Mrs. Frankowski returned to [facility] with spouse.   No identifiable [facility] care coordination needs.   Raiford Noble, MSN, RN,BSN [facility] Post Acute Care Coordinator [phone] (Direct dial)

## 2022-12-19 ENCOUNTER — Telehealth: Payer: Medicare Other | Admitting: Family Medicine

## 2022-12-19 NOTE — Telephone Encounter (Signed)
LVM to schedule office or virtual (if she can do it)

## 2022-12-20 ENCOUNTER — Telehealth: Payer: Self-pay | Admitting: Family Medicine

## 2022-12-20 NOTE — Telephone Encounter (Signed)
Caller states patient has requested to start PT and home health skilled nursing with [facility]. Deidre an be reached @  [phone].

## 2022-12-21 ENCOUNTER — Telehealth (INDEPENDENT_AMBULATORY_CARE_PROVIDER_SITE_OTHER): Payer: Medicare Other | Admitting: Family Medicine

## 2022-12-21 ENCOUNTER — Encounter: Payer: Self-pay | Admitting: Family Medicine

## 2022-12-21 VITALS — BP 108/84 | HR 60 | Ht 64.0 in

## 2022-12-21 DIAGNOSIS — G7241 Inclusion body myositis [IBM]: Secondary | ICD-10-CM

## 2022-12-21 DIAGNOSIS — I4819 Other persistent atrial fibrillation: Secondary | ICD-10-CM

## 2022-12-21 DIAGNOSIS — I5032 Chronic diastolic (congestive) heart failure: Secondary | ICD-10-CM | POA: Diagnosis not present

## 2022-12-21 DIAGNOSIS — J690 Pneumonitis due to inhalation of food and vomit: Secondary | ICD-10-CM | POA: Diagnosis not present

## 2022-12-21 NOTE — Progress Notes (Signed)
Virtual Visit via Video Note  Subjective  CC:  Chief Complaint  Patient presents with   Follow-up    Pt states she is feeling better since her hosp visit.     I connected with Laurieann Lyerly on 12/24/22 at 11:00 AM EDT by a video enabled telemedicine application and verified that I am speaking with the correct person using two identifiers. Location patient: Home Location provider: [facility] [address] Office Persons participating in the virtual visit: Zophia Dress, Willow Ora, MD Trudie Reed CMA  I discussed the limitations of evaluation and management by telemedicine and the availability of in person appointments. The patient expressed understanding and agreed to proceed. HPI: Deanna Rivera is a 89 y.o. female who was contacted today to address the problems listed above in the chief complaint. Reviewed hospitalization in march: influenza and aspiration pneumonia; how home after rehab. Stronger. Dysphagia. Has no complaints. Remains with lower ext weakness due to neurologic condition. Wants to remain independent. Husband is caretaker. No cp or sob. No fevers. Overdue for f/u cxr and labs.   Assessment  1. Aspiration pneumonia of left lower lobe, unspecified aspiration pneumonia type (HCC)   2. Chronic heart failure with preserved ejection fraction (HCC)   3. Persistent atrial fibrillation (HCC)   4. Inclusion body myositis      Plan  Recovered from pnuemonia:  discussed dysphagia diet/recs. Needs f/u cxr. Will try to see if [facility] can go get labs and cxr since she is no longer mobile.  Continue PT.  High risk; pt does not want nursing home placement.  I discussed the assessment and treatment plan with the patient. The patient was provided an opportunity to ask questions and all were answered. The patient agreed with the plan and demonstrated an understanding of the instructions.   The patient was advised to call back or seek an in-person evaluation  if the symptoms worsen or if the condition fails to improve as anticipated. Follow up: 3 months Visit date not found  No orders of the defined types were placed in this encounter.     I reviewed the patients updated PMH, FH, and SocHx.    Patient Active Problem List   Diagnosis Date Noted   Age-related osteoporosis with current pathological fracture 02/06/2021    Priority: High   SSS (sick sinus syndrome) (HCC) 02/11/2019    Priority: High   Status post placement of cardiac pacemaker 07/08/2018    Priority: High   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    Priority: High   Nontoxic multinodular goiter 09/08/2015    Priority: High   Current use of long term anticoagulation 05/16/2015    Priority: High   (HFpEF) heart failure with preserved ejection fraction (HCC) 09/13/2014    Priority: High   Pacemaker 11/09/2011    Priority: High   Impaired fasting glucose 05/23/2009    Priority: High   Long term current use of systemic steroids 03/30/2008    Priority: High   Polymyalgia rheumatica (HCC) 03/30/2008    Priority: High   Benign essential hypertension 05/05/2007    Priority: High   Persistent atrial fibrillation (HCC) 11/06/2006    Priority: High   Pure hypercholesterolemia 11/06/2006    Priority: High   Hypothyroidism due to Hashimoto's thyroiditis, Rx Levothyroxine 06/25/2005    Priority: High   Primary osteoarthritis of right knee 10/23/2019    Priority: Medium    Nonrheumatic mitral valve regurgitation 09/16/2015    Priority:  Medium    Recurrent UTI 03/14/2015    Priority: Medium    Neuralgia, postherpetic 07/02/2019    Priority: Low   OAB (overactive bladder) 07/02/2019    Priority: Low   Constipation 01/18/2019    Priority: Low   Protein-calorie malnutrition, severe 11/06/2022   Physical deconditioning 11/04/2022   Atelectasis, bilateral 11/04/2022   Hypokalemia 11/04/2022   Hyponatremia 11/04/2022   Hypothyroidism 11/04/2022   Degenerative lumbar spinal  stenosis 05/30/2022   Encounter to establish care 01/14/2022   Sensorineural hearing loss (SNHL) of both ears 01/14/2022   Bilateral impacted cerumen 01/14/2022   Hashimoto's thyroiditis 06/15/2021   History of vitamin D deficiency 06/15/2021   Frequent falls 05/17/2021   Compression fracture of body of thoracic vertebra (HCC) 02/06/2021   Shellfish allergy 11/22/2015   Synovial cyst of popliteal space 11/22/2008   Current Meds  Medication Sig   acetaminophen (TYLENOL) 500 MG tablet Take 500 mg by mouth every 6 (six) hours as needed for mild pain or headache.   albuterol (VENTOLIN HFA) 108 (90 Base) MCG/ACT inhaler Inhale 2 puffs into the lungs every 6 (six) hours as needed for wheezing or shortness of breath.   Cholecalciferol (VITAMIN D3) 25 MCG (1000 UT) CAPS Take 1 capsule by mouth daily.   digoxin (LANOXIN) 0.125 MG tablet TAKE 1 TABLET BY MOUTH DAILY (Patient taking differently: Take 0.125 mg by mouth daily.)   dorzolamide-timolol (COSOPT) 22.3-6.8 MG/ML ophthalmic solution Place 1 drop into both eyes 2 (two) times daily.   feeding supplement (ENSURE ENLIVE / ENSURE PLUS) LIQD Take 237 mLs by mouth 3 (three) times daily between meals.   latanoprost (XALATAN) 0.005 % ophthalmic solution Place 1 drop into both eyes at bedtime.   levothyroxine (SYNTHROID) 112 MCG tablet TAKE 1 TABLET BY MOUTH DAILY  BEFORE BREAKFAST   metoprolol succinate (TOPROL-XL) 50 MG 24 hr tablet TAKE 1 TABLET BY MOUTH DAILY (Patient taking differently: Take 50 mg by mouth daily.)   Multiple Vitamin (MULTIVITAMIN WITH MINERALS) TABS tablet Take 1 tablet by mouth daily.   polyethylene glycol (MIRALAX / GLYCOLAX) 17 g packet Take 17 g by mouth daily as needed for severe constipation.   potassium chloride (KLOR-CON 10) 10 MEQ tablet Take 1 tablet (10 mEq total) by mouth daily.   predniSONE (DELTASONE) 1 MG tablet TAKE 1 TABLET BY MOUTH DAILY (Patient taking differently: Take 1 mg by mouth daily with breakfast.)     Allergies: Patient is allergic to shellfish-derived products, epinephrine, monosodium glutamate, monosodium glutamate, ciprofloxacin, dronedarone, erythromycin, sulfa antibiotics, sulfamethoxazole-trimethoprim, and tetracyclines & related. Family History: Patient family history includes Cirrhosis (age of onset: 12) in her father; Early death (age of onset: 59) in her mother; Hypertension in her brother; Lung cancer (age of onset: 8) in her brother; Scleroderma in her daughter. Social History:  Patient  reports that she has quit smoking. Her smoking use included cigarettes. She has never used smokeless tobacco. She reports that she does not currently use alcohol. She reports that she does not currently use drugs.  Review of Systems: Constitutional: Negative for fever malaise or anorexia Cardiovascular: negative for chest pain Respiratory: negative for SOB or persistent cough Gastrointestinal: negative for abdominal pain  OBJECTIVE Vitals: BP 108/84   Pulse 60   Ht 5\' 4"  (1.626 m)   SpO2 98%   BMI 22.48 kg/m  General: no acute distress , A&Ox3  Willow Ora, MD

## 2022-12-24 NOTE — Telephone Encounter (Signed)
Called to speak with Deanna Rivera but no answer. Phone just kept ringing

## 2022-12-25 ENCOUNTER — Other Ambulatory Visit: Payer: Self-pay

## 2022-12-25 ENCOUNTER — Telehealth: Payer: Self-pay | Admitting: Internal Medicine

## 2022-12-25 ENCOUNTER — Other Ambulatory Visit (INDEPENDENT_AMBULATORY_CARE_PROVIDER_SITE_OTHER): Payer: Medicare Other

## 2022-12-25 DIAGNOSIS — J690 Pneumonitis due to inhalation of food and vomit: Secondary | ICD-10-CM

## 2022-12-25 DIAGNOSIS — I10 Essential (primary) hypertension: Secondary | ICD-10-CM

## 2022-12-25 NOTE — Telephone Encounter (Signed)
Pt would like to know if Home Health is able to draw labs and have results sent to our office. Please advise

## 2022-12-26 ENCOUNTER — Telehealth: Payer: Self-pay | Admitting: Family Medicine

## 2022-12-26 LAB — CBC WITH DIFFERENTIAL/PLATELET
Basophils Absolute: 0.1 10*3/uL (ref 0.0–0.1)
Basophils Relative: 1.2 % (ref 0.0–3.0)
Eosinophils Absolute: 0.1 10*3/uL (ref 0.0–0.7)
Eosinophils Relative: 1.4 % (ref 0.0–5.0)
HCT: 37.2 % (ref 36.0–46.0)
Hemoglobin: 12.6 g/dL (ref 12.0–15.0)
Lymphocytes Relative: 35.4 % (ref 12.0–46.0)
Lymphs Abs: 2 10*3/uL (ref 0.7–4.0)
MCHC: 33.9 g/dL (ref 30.0–36.0)
MCV: 95.2 fl (ref 78.0–100.0)
Monocytes Absolute: 0.5 10*3/uL (ref 0.1–1.0)
Monocytes Relative: 8.9 % (ref 3.0–12.0)
Neutro Abs: 3 10*3/uL (ref 1.4–7.7)
Neutrophils Relative %: 53.1 % (ref 43.0–77.0)
Platelets: 249 10*3/uL (ref 150.0–400.0)
RBC: 3.91 Mil/uL (ref 3.87–5.11)
RDW: 15.7 % — ABNORMAL HIGH (ref 11.5–15.5)
WBC: 5.6 10*3/uL (ref 4.0–10.5)

## 2022-12-26 NOTE — Telephone Encounter (Signed)
Home Health Verbal Orders  Agency:  [facility]  Caller: Stanton Kidney  Reason for Request:  Part of lab results, was a little dehydrated and took partial blood drawn, will get the rest tomorrow. Please call back to get the lab results she has at the moment.   [phone]

## 2022-12-26 NOTE — Telephone Encounter (Signed)
Pt calling back for an update.  Pt also states she is unable to put her shoes on because her feet are very swollen. Pt states she is on half of a fluid pill and not experiencing any sob currently.

## 2022-12-26 NOTE — Telephone Encounter (Signed)
Pt called back per message received yesterday and this morning in [facility] Triage.     When called, Pt stated she has bilateral feet edema.  Pt walks very little, and states she has difficulties getting her shoes on.  Pt states she does not have shortness of breath, and denies any other symptoms.  Pt wanted to come into [facility] and see a provider, but requires assistance and transportation.    I checked all provider schedules at [facility] street and [place].  A few appointments available in the afternoon, but Pt requires transportation, and afternoon appointment not an option.  Reached out to [address] scheduling to double check.    No appointments available.  Pt called back, and advised either contact PCP's office for appointment / request to speak with RN to assess current edema of feet; 2. If new symptoms occur, or worsening edema, an ER visit to see a provider is advised.  Pt verbalized understanding, and stated she will reach out to PCP for appointment / advise regarding next steps.  Pt verbalized understanding to follow up with PCP or ER as discussed.

## 2022-12-27 ENCOUNTER — Telehealth: Payer: Self-pay | Admitting: Family Medicine

## 2022-12-27 LAB — COMPREHENSIVE METABOLIC PANEL
ALT: 21 U/L (ref 0–35)
AST: 29 U/L (ref 0–37)
Albumin: 3.4 g/dL — ABNORMAL LOW (ref 3.5–5.2)
Alkaline Phosphatase: 53 U/L (ref 39–117)
BUN: 17 mg/dL (ref 6–23)
CO2: 29 mEq/L (ref 19–32)
Calcium: 10 mg/dL (ref 8.4–10.5)
Chloride: 98 mEq/L (ref 96–112)
Creatinine, Ser: 0.24 mg/dL — ABNORMAL LOW (ref 0.40–1.20)
GFR: 100.32 mL/min (ref >60.00)
Glucose, Bld: 98 mg/dL (ref 70–99)
Potassium: 4 mEq/L (ref 3.5–5.1)
Sodium: 135 mEq/L (ref 135–145)
Total Bilirubin: 0.6 mg/dL (ref 0.2–1.2)
Total Protein: 7.3 g/dL (ref 6.0–8.3)

## 2022-12-27 NOTE — Telephone Encounter (Signed)
Home Health Verbal Orders  Agency:  [facility]  Caller: Doug  Contact and title  Ph# [phone] -PT  Requesting PT:    Reason for Request: Weakness, transfer and instability   Frequency:  2 x per week for 4 weeks, then 1 x per week for 5 weeks  HH needs F2F w/in last 30 days

## 2022-12-27 NOTE — Telephone Encounter (Signed)
Chest x-ray has been faxed over to [facility] @ [phone] and phone # is [phone]

## 2022-12-27 NOTE — Telephone Encounter (Signed)
Spoke with Gala Romney to move forward with verbal orders for PT

## 2022-12-30 NOTE — Progress Notes (Signed)
See mychart note Dear Ms. Signer, Your chest xray now looks fine. Your pneumonia has completely resolved.  Sincerely, Dr. Mardelle Matte

## 2022-12-30 NOTE — Progress Notes (Signed)
See mychart note Dear Ms. Johnstone, Your lab tests have all improved.   Sincerely, Dr. Mardelle Matte

## 2023-01-04 NOTE — Telephone Encounter (Signed)
Patient states she has a increased edema in lower legs and feet, no reported SOB and CP. She has a virtual visit on 5/10 with her PCP and was advised to see her cardiologist.   I offered her an appointment with Otilio Saber on 5/28 at 08:20 but she declined due to lack of facility transportation that early in the day. Scheduled with DOD for later in the day.  Education provided on when to go to the ED or call 911. Patient verbalized understanding and had no questions.

## 2023-01-04 NOTE — Telephone Encounter (Signed)
Patient called back.She states she was told she needed to be seen very soon. Offered her an appointment 5/28 with Mardelle Matte, but she refused and stated she is supposed to see an MD. I did not see anything until August with Dr. Ladona Ridgel.  Patient requests a nurse call her back.

## 2023-01-05 NOTE — Progress Notes (Deleted)
Cardiology Office Note:   Date:  01/05/2023  ID:  Deanna Rivera, DOB 02/11/1933, MRN [id number]  History of Present Illness:   Deanna Rivera is a 89 y.o. female with history of persistent Afib, HTN, SSS s/p PPM placement, moderate AS, and moderate MR who now presents to clinic for follow-up.  Patient followed by Dr. Ladona Ridgel for persistent Afib and SSS s/p PPM. Last TTE 10/2022 with EF 65-70%, normal RV, biatrial enlargement, mild to moderate MR, moderate to severe TR, moderate AS. Was last seen in clinic on 10/2022 by Dr. Ladona Ridgel. Was complaining of LE edema at that time as well as cold feet. She was referred to vascular.  Admitted 11/07/22 with weakness found to be severely deconditioned. Was discharged to SNF.  Past Medical History:  Diagnosis Date   Cataract    Hypertension    Moderate aortic stenosis    Moderate mitral regurgitation    Persistent atrial fibrillation (HCC)    PMR (polymyalgia rheumatica) (HCC)    Second degree AV block    Sick sinus syndrome (HCC)    Thyroid disease      ROS: ***  Studies Reviewed:    EKG:  ***  Cardiac Studies & Procedures       ECHOCARDIOGRAM  ECHOCARDIOGRAM COMPLETE 10/16/2022  Narrative ECHOCARDIOGRAM REPORT    Patient Name:   Deanna Rivera Date of Exam: 10/16/2022 Medical Rec #:  [id number]      Height:       64.0 in Accession #:    [id number]     Weight:       127.0 lb Date of Birth:  [birth year]      BSA:          1.613 m Patient Age:    89 years       BP:           151/70 mmHg Patient Gender: F              HR:           52 bpm. Exam Location:  [address]  Procedure: 2D Echo, Color Doppler and Cardiac Doppler  Indications:    Congestive Heart Failure- Acute Diastolic I50.31  History:        Patient has prior history of Echocardiogram examinations, most recent 01/09/2022. Pacemaker, Pulmonary HTN, Arrythmias:Atrial Fibrillation; Risk Factors:Hypertension.  Sonographer:    Thurman Coyer RDCS Referring Phys: [id number]  Doylene Canning TAYLOR  IMPRESSIONS   1. Left ventricular ejection fraction, by estimation, is 65 to 70%. The left ventricle has normal function. The left ventricle has no regional wall motion abnormalities. Left ventricular diastolic function could not be evaluated. 2. Right ventricular systolic function is normal. The right ventricular size is normal. There is severely elevated pulmonary artery systolic pressure. The estimated right ventricular systolic pressure is 62.1 mmHg. 3. Left atrial size was severely dilated. 4. Right atrial size was severely dilated. 5. The mitral valve is normal in structure. Mild to moderate mitral valve regurgitation. 6. Tricuspid valve regurgitation is moderate to severe. 7. The aortic valve is tricuspid. There is mild calcification of the aortic valve. There is moderate thickening of the aortic valve. Aortic valve regurgitation is trivial. Moderate aortic valve stenosis. 8. The inferior vena cava is dilated in size with <50% respiratory variability, suggesting right atrial pressure of 15 mmHg.  Comparison(s): Prior images reviewed side by side. The tricuspid insufficiency appears slightly worse.  Conclusion(s)/Recommendation(s): Findings are consistent with moderate paradoxical low-flow, low-gradient calcific aortic stenosis. However,  the aortic stenosis does not appear severe enough to explain this degree of pulmonary artery hypertension. Consider an infiltrative cardiomyopathy, such as amyloidosis.  FINDINGS Left Ventricle: Left ventricular ejection fraction, by estimation, is 65 to 70%. The left ventricle has normal function. The left ventricle has no regional wall motion abnormalities. The left ventricular internal cavity size was normal in size. There is no left ventricular hypertrophy. Abnormal (paradoxical) septal motion, consistent with left bundle branch block. Left ventricular diastolic function could not be evaluated due to atrial fibrillation. Left  ventricular diastolic function could not be evaluated.  Right Ventricle: The right ventricular size is normal. No increase in right ventricular wall thickness. Right ventricular systolic function is normal. There is severely elevated pulmonary artery systolic pressure. The tricuspid regurgitant velocity is 3.43 m/s, and with an assumed right atrial pressure of 15 mmHg, the estimated right ventricular systolic pressure is 62.1 mmHg.  Left Atrium: Left atrial size was severely dilated.  Right Atrium: Right atrial size was severely dilated.  Pericardium: There is no evidence of pericardial effusion.  Mitral Valve: The mitral valve is normal in structure. Mild to moderate mitral valve regurgitation, with centrally-directed jet.  Tricuspid Valve: The tricuspid valve is normal in structure. Tricuspid valve regurgitation is moderate to severe.  Aortic Valve: The aortic valve is tricuspid. There is mild calcification of the aortic valve. There is moderate thickening of the aortic valve. Aortic valve regurgitation is trivial. Moderate aortic stenosis is present. Aortic valve mean gradient measures 10.0 mmHg. Aortic valve peak gradient measures 17.1 mmHg. Aortic valve area, by VTI measures 1.01 cm.  Pulmonic Valve: The pulmonic valve was normal in structure. Pulmonic valve regurgitation is mild.  Aorta: The aortic root and ascending aorta are structurally normal, with no evidence of dilitation.  Venous: The inferior vena cava is dilated in size with less than 50% respiratory variability, suggesting right atrial pressure of 15 mmHg.  IAS/Shunts: There is right bowing of the interatrial septum, suggestive of elevated left atrial pressure. No atrial level shunt detected by color flow Doppler.  Additional Comments: A device lead is visualized in the right ventricle.   LEFT VENTRICLE PLAX 2D LVIDd:         4.00 cm   Diastology LVIDs:         2.80 cm   LV e' medial:    6.40 cm/s LV PW:          1.00 cm   LV E/e' medial:  13.6 LV IVS:        1.00 cm   LV e' lateral:   9.29 cm/s LVOT diam:     2.04 cm   LV E/e' lateral: 9.4 LV SV:         44 LV SV Index:   27 LVOT Area:     3.27 cm   RIGHT VENTRICLE RV Basal diam:  3.60 cm RV Mid diam:    3.10 cm RV S prime:     11.90 cm/s TAPSE (M-mode): 2.7 cm  LEFT ATRIUM             Index        RIGHT ATRIUM           Index LA diam:        4.30 cm 2.67 cm/m   RA Area:     23.60 cm LA Vol (A2C):   65.2 ml 40.42 ml/m  RA Volume:   63.50 ml  39.37 ml/m LA Vol (A4C):   65.0  ml 40.30 ml/m LA Biplane Vol: 66.5 ml 41.23 ml/m AORTIC VALVE AV Area (Vmax):    1.02 cm AV Area (Vmean):   0.99 cm AV Area (VTI):     1.01 cm AV Vmax:           207.00 cm/s AV Vmean:          147.000 cm/s AV VTI:            0.436 m AV Peak Grad:      17.1 mmHg AV Mean Grad:      10.0 mmHg LVOT Vmax:         64.40 cm/s LVOT Vmean:        44.300 cm/s LVOT VTI:          0.135 m LVOT/AV VTI ratio: 0.31  AORTA Ao Root diam: 3.20 cm Ao Asc diam:  3.40 cm  MITRAL VALVE                  TRICUSPID VALVE MV Area (PHT): 4.15 cm       TR Peak grad:   47.1 mmHg MV Decel Time: 183 msec       TR Vmax:        343.00 cm/s MR Peak grad:    129.0 mmHg MR Mean grad:    78.0 mmHg    SHUNTS MR Vmax:         568.00 cm/s  Systemic VTI:  0.14 m MR Vmean:        418.0 cm/s   Systemic Diam: 2.04 cm MR PISA:         1.57 cm MR PISA Eff ROA: 9 mm MR PISA Radius:  0.50 cm MV E velocity: 87.00 cm/s MV A velocity: 36.70 cm/s MV E/A ratio:  2.37  Mihai Croitoru MD Electronically signed by Thurmon Fair MD Signature Date/Time: 10/16/2022/1:41:17 PM    Final              Risk Assessment/Calculations:   {Does this patient have ATRIAL FIBRILLATION?[phone]} No BP recorded.  {Refresh Note OR Click here to enter BP  :1}***        Physical Exam:   VS:  There were no vitals taken for this visit.   Wt Readings from Last 3 Encounters:  11/06/22 130 lb 15.3 oz  (59.4 kg)  10/29/22 120 lb (54.4 kg)  10/09/22 127 lb (57.6 kg)     GEN: Well nourished, well developed in no acute distress NECK: No JVD; No carotid bruits CARDIAC: ***RRR, no murmurs, rubs, gallops RESPIRATORY:  Clear to auscultation without rales, wheezing or rhonchi  ABDOMEN: Soft, non-tender, non-distended EXTREMITIES:  No edema; No deformity   ASSESSMENT AND PLAN:   #Permanent Afib: -Continue apixaban 2.5mg  BID -Continue dig 0.125mg  daily -Continue metop 50mg  XL daily  #SSS s/p PPM: -Follow-up with Dr. Ladona Ridgel as scheduled  #Moderate AS: -TTE 10/2022 with mean gradient , AVA 1.1cm2 -Continue serial montoring  #Mild to Moderate MR: #Moderate to severe TR: -Continue serial management  #Severe Deconditioning: -Follows with Dr. Mardelle Matte -Continue PT    {Are you ordering a CV Procedure (e.g. stress test, cath, DCCV, TEE, etc)?   Press F2        :[id number]}   Signed, Meriam Sprague, MD

## 2023-01-08 ENCOUNTER — Ambulatory Visit: Payer: Medicare Other | Admitting: Cardiology

## 2023-01-08 ENCOUNTER — Telehealth: Payer: Self-pay | Admitting: Family Medicine

## 2023-01-08 NOTE — Telephone Encounter (Signed)
Pt states she was called and left VM informing her that she was due for AWV. I was not able to see this but pt is interested in scheduling this. Can you call patient back and schedule?

## 2023-01-09 ENCOUNTER — Telehealth: Payer: Self-pay | Admitting: Family Medicine

## 2023-01-09 NOTE — Telephone Encounter (Signed)
Form has been placed in andy's box

## 2023-01-09 NOTE — Telephone Encounter (Signed)
NEEDS A CALL BACK THIS AM AS SOON AS POSSIBLE   Home Health Verbal Orders  Agency:  [facility]  Caller:  Oge  Contact and title  Requesting OT/ PT/ Skilled nursing/ Social Work/ Speech:    Reason for Request:  OT, 1 time a week for 7 weeks   [phone] Leave message with approval on VM if she does not pick up

## 2023-01-09 NOTE — Telephone Encounter (Signed)
Spoke with Deanna Rivera to move forward with verbal orders

## 2023-01-09 NOTE — Telephone Encounter (Signed)
Patient dropped off document Home Health Certificate (Order ID [id number]), to be filled out by provider. Patient requested to send it via Fax within 5-days. Document is located in providers tray at front office.Please advise

## 2023-01-10 ENCOUNTER — Telehealth: Payer: Self-pay | Admitting: Internal Medicine

## 2023-01-10 NOTE — Telephone Encounter (Signed)
Message received from [facility] Triage,  Per Dr. Lewayne Bunting:  Lasix 40 mg daily x 4 days, then stop.    Pt called Voicemail message left.  Will follow up tomorrow with medication orders from Dr. Ladona Ridgel above.

## 2023-01-10 NOTE — Telephone Encounter (Signed)
Pt c/o swelling: STAT is pt has developed SOB within 24 hours  If swelling, where is the swelling located? Legs and possibly stomach   How much weight have you gained and in what time span? Unsure  Have you gained 3 pounds in a day or 5 pounds in a week? Unsure   Do you have a log of your daily weights (if so, list)? No   Are you currently taking a fluid pill? Yes   Are you currently SOB? No   Have you traveled recently? No   Is unable to wear shoes due to swelling. Has been ongoing for a month and a half.

## 2023-01-11 NOTE — Addendum Note (Signed)
Addended by: Bennie Dallas C on: 01/11/2023 09:05 AM   Modules accepted: Orders

## 2023-01-11 NOTE — Telephone Encounter (Signed)
Pt called back, follow up, to advise of Dr. Ladona Ridgel order for Lasix 40 mg daily for 4 days.  Pt called last night 5/30, but unable to reach patient.      Pt stated that she had been having difficulties with her feet and ankles for a month and a half.  Pt states the edema has worsened, and is having difficulties wearing her shoes.     Pt advised Dr. Ladona Ridgel gave order for Lasix 40 mg, daily, for 4 days;  Pt refused and stated a provider at PT had put her on HCTZ 12.5 mg ( half tablet ) daily.  This was unknown, and was not indicated on her MAR?  Pt requested a visit with Dr. Ladona Ridgel or [facility] provider to assess her feet / ankles.  With Dr. Lubertha Basque current schedule being full / limited availability, and unknown how much edema is present, a providers office visit with Mr. Tereso Newcomer PA-C was scheduled for Tuesday, 01/15/2023 at 245 pm.  An assessment of her current medication list may also be required as Pt last office visit 09/11/2022.   Pt has BS PPM, and has PMH of bilateral lower extremity edema per Dr. Lubertha Basque office visit note.  Pt appreciated the appointment with Mr. Huntley Dec, to assess her lower extremity concerns.

## 2023-01-15 ENCOUNTER — Encounter: Payer: Self-pay | Admitting: Physician Assistant

## 2023-01-15 ENCOUNTER — Ambulatory Visit: Payer: Medicare Other | Attending: Physician Assistant | Admitting: Physician Assistant

## 2023-01-15 VITALS — BP 128/70 | HR 60 | Ht 64.0 in | Wt 122.0 lb

## 2023-01-15 DIAGNOSIS — I38 Endocarditis, valve unspecified: Secondary | ICD-10-CM | POA: Diagnosis present

## 2023-01-15 DIAGNOSIS — I272 Pulmonary hypertension, unspecified: Secondary | ICD-10-CM | POA: Insufficient documentation

## 2023-01-15 DIAGNOSIS — I4819 Other persistent atrial fibrillation: Secondary | ICD-10-CM | POA: Insufficient documentation

## 2023-01-15 DIAGNOSIS — I495 Sick sinus syndrome: Secondary | ICD-10-CM | POA: Diagnosis present

## 2023-01-15 DIAGNOSIS — I5032 Chronic diastolic (congestive) heart failure: Secondary | ICD-10-CM | POA: Diagnosis not present

## 2023-01-15 DIAGNOSIS — R600 Localized edema: Secondary | ICD-10-CM | POA: Insufficient documentation

## 2023-01-15 MED ORDER — HYDROCHLOROTHIAZIDE 25 MG PO TABS: 25.0000 mg | ORAL_TABLET | Freq: Every day | ORAL | 3 refills | Status: DC

## 2023-01-15 MED ORDER — POTASSIUM CHLORIDE ER 10 MEQ PO TBCR: 10.0000 meq | EXTENDED_RELEASE_TABLET | Freq: Every day | ORAL | 3 refills | Status: DC

## 2023-01-15 NOTE — Assessment & Plan Note (Signed)
Her lower extremity edema is likely multifactorial.  She has seen vascular surgeon in the past due to venous reflux.  Compression and elevation were recommended at that time.  She also has pulmonary hypertension and likely has some RV dysfunction contributing to her lower extremity swelling.  Her neuromuscular disorder has progressively gotten worse over time and she is wheelchair-bound.  Deconditioning likely contributes.  She also has hypoalbuminemia which is also likely a factor.  We discussed a multipronged approach to help improve her swelling.  I think switching from hydrochlorothiazide to furosemide is reasonable.  She is somewhat hesitant to do this.  After much discussion, we decided to increase her hydrochlorothiazide to 25 mg daily.  She has had hypokalemia in the past.  I will add K+ 10 mEq daily back to her regimen.  Obtain BMET, BNP today.  Repeat a BMET in a week.  If she does not have adequate response to increasing hydrochlorothiazide, she will contact us.  At that point, I will switch hydrochlorothiazide to furosemide 20 mg daily.  Follow-up 3 months.

## 2023-01-15 NOTE — Assessment & Plan Note (Signed)
She is over 89 years old and her weight is <60 kg.  Continue Eliquis for 2.5 mg twice daily.  Continue digoxin 0.125 mg daily, Toprol-XL 50 mg daily.

## 2023-01-15 NOTE — Assessment & Plan Note (Signed)
She missed her appointment with Dr. Shirlee Latch in the advanced heart failure clinic for further evaluation of pulmonary hypertension because she was admitted with pneumonia.  We will reschedule her appointment with Dr. Shirlee Latch.

## 2023-01-15 NOTE — Assessment & Plan Note (Signed)
Echocardiogram in March 2024 demonstrated moderate aortic stenosis, moderate to severe tricuspid regurgitation and mild to moderate mitral regurgitation.  She is not likely a candidate for surgical intervention.  She may be a candidate for TAVR if her aortic stenosis worsens.

## 2023-01-15 NOTE — Assessment & Plan Note (Signed)
Echocardiogram in March 2024 demonstrated EF 65-70.  She does not have rales on exam.  She does not have HJR.  Her weight is actually down.  I am not convinced that her leg edema is all related to worsening heart failure.  Obtain BNP today as noted.

## 2023-01-15 NOTE — Assessment & Plan Note (Signed)
Status post pacemaker.  Continue follow-up with EP as planned. 

## 2023-01-15 NOTE — Progress Notes (Signed)
Cardiology Office Note:    Date:  01/15/2023  ID:  Deanna Rivera, DOB 10-30-1932, MRN [id number] PCP: Willow Ora, MD  [facility] Providers Cardiologist:  None Electrophysiologist:  Lewayne Bunting, MD       Patient Profile:      Permanent atrial fibrillation Sick sinus syndrome s/p pacemaker (HFpEF) heart failure with preserved ejection fraction  Pulmonary hypertension  Valvular heart disease Aortic stenosis, mitral regurgitation TTE 10/16/2022: EF 65-70, no RWMA, normal RVSF, severe pulmonary hypertension, RVSP 62.1, severe BAE, mild-moderate MR, moderate to severe TR, trivial AI, moderate aortic stenosis, RAP 15, mean AV gradient 10, AV V-max 207 cm/s, DI 0.31 Hypertension Polymyalgia rheumatica Inclusion body myositis Chronic leg edema Hypothyroidism       History of Present Illness:   Deanna Rivera is a 89 y.o. female who returns for evaluation of leg edema. She was last seen by Dr. Ladona Ridgel in 08/2022. She was referred to vascular surgery at that time due to leg edema and possible arterial insufficiency. She saw Dr. Lenell Antu in 09/2022. Venous duplex was + for GSV reflux. Compression and elevation were recommended. She was not felt to be a candidate for endovenous ablation.  Dr. Ladona Ridgel had obtained a follow-up echocardiogram which demonstrated normal EF, mild to moderate mitral regurgitation, moderate to severe tricuspid regurgitation and moderate aortic stenosis.  She was also noted to have severe pulmonary hypertension.  She was referred to the heart failure clinic with Dr. Shirlee Latch.  However, she did not show for this appointment. She called in with worsening edema and it was recommended that she take Lasix for 4 days.   She is here today with her husband.  Of note, she was admitted in March with pneumonia.  She was discharged to rehab.  Her edema was improved at discharge.  However, after about a week at rehab, she developed worsening pedal edema.  Her neuromuscular disorder has  gotten worse over time.  She is now wheelchair-bound.  She is limited in her activity.  She has not been short of breath.  She sleeps on 2 pillows chronically.  She has not had chest pain or syncope.  She did not start on furosemide because she also takes hydrochlorothiazide.  Review of Systems  Hematologic/Lymphatic: Negative for bleeding problem.   see the HPI    Studies Reviewed:    EKG: Not done   Risk Assessment/Calculations:    CHA2DS2-VASc Score = 5   This indicates a 7.2% annual risk of stroke. The patient's score is based upon: CHF History: 1 HTN History: 1 Diabetes History: 0 Stroke History: 0 Vascular Disease History: 0 Age Score: 2 Gender Score: 1            Physical Exam:   VS:  BP 128/70   Pulse 60   Ht 5\' 4"  (1.626 m)   Wt 122 lb (55.3 kg)   SpO2 98%   BMI 20.94 kg/m    Wt Readings from Last 3 Encounters:  01/15/23 122 lb (55.3 kg)  11/06/22 130 lb 15.3 oz (59.4 kg)  10/29/22 120 lb (54.4 kg)    Constitutional:      Appearance: Not in distress. Chronically ill-appearing.  Pulmonary:     Breath sounds: Normal breath sounds. No wheezing. No rales.  Cardiovascular:     Normal rate. Irregularly irregular rhythm.     Murmurs: There is a grade 2/6 systolic murmur at the URSB.  Edema:    Peripheral edema present.    Pretibial: bilateral  trace edema of the pretibial area.    Ankle: bilateral 1+ edema of the ankle.    Feet: bilateral 2+ edema of the feet. Abdominal:     Palpations: Abdomen is soft.       ASSESSMENT AND PLAN:   Bilateral lower extremity edema Her lower extremity edema is likely multifactorial.  She has seen vascular surgeon in the past due to venous reflux.  Compression and elevation were recommended at that time.  She also has pulmonary hypertension and likely has some RV dysfunction contributing to her lower extremity swelling.  Her neuromuscular disorder has progressively gotten worse over time and she is wheelchair-bound.   Deconditioning likely contributes.  She also has hypoalbuminemia which is also likely a factor.  We discussed a multipronged approach to help improve her swelling.  I think switching from hydrochlorothiazide to furosemide is reasonable.  She is somewhat hesitant to do this.  After much discussion, we decided to increase her hydrochlorothiazide to 25 mg daily.  She has had hypokalemia in the past.  I will add K+ 10 mEq daily back to her regimen.  Obtain BMET, BNP today.  Repeat a BMET in a week.  If she does not have adequate response to increasing hydrochlorothiazide, she will contact us.  At that point, I will switch hydrochlorothiazide to furosemide 20 mg daily.  Follow-up 3 months.  Pulmonary hypertension, unspecified (HCC) She missed her appointment with Dr. Shirlee Latch in the advanced heart failure clinic for further evaluation of pulmonary hypertension because she was admitted with pneumonia.  We will reschedule her appointment with Dr. Shirlee Latch.  (HFpEF) heart failure with preserved ejection fraction [facility]) Echocardiogram in March 2024 demonstrated EF 65-70.  She does not have rales on exam.  She does not have HJR.  Her weight is actually down.  I am not convinced that her leg edema is all related to worsening heart failure.  Obtain BNP today as noted.  Persistent atrial fibrillation (HCC) She is over 20 years old and her weight is <60 kg.  Continue Eliquis for 2.5 mg twice daily.  Continue digoxin 0.125 mg daily, Toprol-XL 50 mg daily.  SSS (sick sinus syndrome) (HCC) Status post pacemaker.  Continue follow-up with EP as planned.  Valvular heart disease Echocardiogram in March 2024 demonstrated moderate aortic stenosis, moderate to severe tricuspid regurgitation and mild to moderate mitral regurgitation.  She is not likely a candidate for surgical intervention.  She may be a candidate for TAVR if her aortic stenosis worsens.     Dispo:  Return in about 3 months (around 04/17/2023) for Routine Follow  Up with Dr. Ladona Ridgel.  Signed, Tereso Newcomer, PA-C

## 2023-01-15 NOTE — Patient Instructions (Addendum)
Medication Instructions:  Your physician has recommended you make the following change in your medication:   INCREASE the Hydrochlorothiazide HCTZ to 25 mg taking 1 tablet daily   START Potassium 10 meq taking 1 daily    *If you need a refill on your cardiac medications before your next appointment, please call your pharmacy*   Lab Work: TODAY:  BMET  1 WEEK:  BMET  If you have labs (blood work) drawn today and your tests are completely normal, you will receive your results only by: MyChart Message (if you have MyChart) OR A paper copy in the mail If you have any lab test that is abnormal or we need to change your treatment, we will call you to review the results.   Testing/Procedures: None ordered  Your physician recommends that you schedule a follow-up appointment in: 1ST AVAILABLE IN THE HEART FAILURE CLINIC.  PT HAD ONE IN MARCH BUT MISSED IT DUE TO BEING IN HOSPITAL   Follow-Up: At [facility], you and your health needs are our priority.  As part of our continuing mission to provide you with exceptional heart care, we have created designated Provider Care Teams.  These Care Teams include your primary Cardiologist (physician) and Advanced Practice Providers (APPs -  Physician Assistants and Nurse Practitioners) who all work together to provide you with the care you need, when you need it.  We recommend signing up for the patient portal called "MyChart".  Sign up information is provided on this After Visit Summary.  MyChart is used to connect with patients for Virtual Visits (Telemedicine).  Patients are able to view lab/test results, encounter notes, upcoming appointments, etc.  Non-urgent messages can be sent to your provider as well.   To learn more about what you can do with MyChart, go to [email or web address].    Your next appointment:   3 month(s)  Provider:   Lewayne Bunting, MD    Other Instructions  Try to elevate your legs and wear compression stockings  as much as you can   Call in 3-5 days if your edema is not any better with the medication changes

## 2023-01-16 ENCOUNTER — Ambulatory Visit: Payer: Medicare Other | Admitting: Family Medicine

## 2023-01-16 LAB — BASIC METABOLIC PANEL
BUN/Creatinine Ratio: 39 — ABNORMAL HIGH (ref 12–28)
BUN: 14 mg/dL (ref 8–27)
CO2: 27 mmol/L (ref 20–29)
Calcium: 10.4 mg/dL — ABNORMAL HIGH (ref 8.7–10.3)
Chloride: 97 mmol/L (ref 96–106)
Creatinine, Ser: 0.36 mg/dL — ABNORMAL LOW (ref 0.57–1.00)
Glucose: 91 mg/dL (ref 70–99)
Potassium: 4 mmol/L (ref 3.5–5.2)
Sodium: 136 mmol/L (ref 134–144)
eGFR: 98 mL/min/{1.73_m2} (ref >59)

## 2023-01-16 NOTE — Addendum Note (Signed)
Addended by: Burnetta Sabin on: 01/16/2023 07:56 AM   Modules accepted: Orders

## 2023-01-17 ENCOUNTER — Telehealth: Payer: Self-pay | Admitting: Internal Medicine

## 2023-01-17 NOTE — Telephone Encounter (Signed)
Pt c/o medication issue:  1. Name of Medication: potassium chloride (KLOR-CON 10) 10 MEQ tablet   2. How are you currently taking this medication (dosage and times per day)? Take 1 tablet (10 mEq total) by mouth daily.   3. Are you having a reaction (difficulty breathing--STAT)? No   4. What is your medication issue? Pharmacy called and said that a new prescription need to be sent in for capsules due to patient having a hard time swallowing pill

## 2023-01-17 NOTE — Progress Notes (Signed)
Pt has been made aware of normal result and verbalized understanding.  jw

## 2023-01-17 NOTE — Telephone Encounter (Signed)
Message received in [facility] Triage office from [facility] pharmacy, [phone].    Per message received, Pt is having difficulties swallowing 10 Meq tablet.  Mr. Huntley Dec ordered 10 Meq tablet at [facility]/15/2023 [facility] appointment.    Consulted Pharm D Ms. Patel, and will research capsule option / easier for Pt to swallow.  Pharm D Ms. Patel to follow up with Pharmacy  / Pt request 01/18/2023, and will send new script into pharmacy.  Follow up still required.

## 2023-01-18 LAB — KAPPA/LAMBDA LIGHT CHAINS
Kappa free light chain: 51.6
Kappa, lambda light chain ratio: 0.71
Lambda free light chains: 72.8

## 2023-01-18 MED ORDER — POTASSIUM CHLORIDE 10 % PO SOLN: 10.0000 meq | Freq: Every day | ORAL | 0 refills | Status: DC

## 2023-01-18 NOTE — Telephone Encounter (Signed)
Follow Up:      Please call, patient says she still have not  received her medicine.

## 2023-01-18 NOTE — Telephone Encounter (Signed)
Patient's son is following up. He states the patient informed him that the medication was changed and they would like a call back with updates. He mentions that [facility] has been trying to contact the office.

## 2023-01-18 NOTE — Telephone Encounter (Signed)
Changed prescription tab to liq

## 2023-01-18 NOTE — Telephone Encounter (Signed)
Will route this message to Carmela Hurt [facility] for further assistance with this request.

## 2023-01-18 NOTE — Telephone Encounter (Signed)
Pt and son aware that our Pharmacist sent in liquid potassium chloride (take 10 mEq po daily) to the her pharmacy of choice, [facility].  Both parties verbalized understanding and agrees with this plan.

## 2023-01-22 ENCOUNTER — Telehealth: Payer: Self-pay | Admitting: Internal Medicine

## 2023-01-22 ENCOUNTER — Other Ambulatory Visit: Payer: Self-pay | Admitting: Nurse Practitioner

## 2023-01-22 ENCOUNTER — Other Ambulatory Visit: Payer: Medicare Other

## 2023-01-22 ENCOUNTER — Inpatient Hospital Stay: Payer: Medicare Other | Attending: Oncology

## 2023-01-22 DIAGNOSIS — D472 Monoclonal gammopathy: Secondary | ICD-10-CM | POA: Diagnosis present

## 2023-01-22 DIAGNOSIS — I5032 Chronic diastolic (congestive) heart failure: Secondary | ICD-10-CM

## 2023-01-22 LAB — CBC WITH DIFFERENTIAL (CANCER CENTER ONLY)
Abs Immature Granulocytes: 0.02 10*3/uL (ref 0.00–0.07)
Basophils Absolute: 0.1 10*3/uL (ref 0.0–0.1)
Basophils Relative: 1 %
Eosinophils Absolute: 0.1 10*3/uL (ref 0.0–0.5)
Eosinophils Relative: 1 %
HCT: 37.6 % (ref 36.0–46.0)
Hemoglobin: 12.8 g/dL (ref 12.0–15.0)
Immature Granulocytes: 0 %
Lymphocytes Relative: 43 %
Lymphs Abs: 2.8 10*3/uL (ref 0.7–4.0)
MCH: 32.3 pg (ref 26.0–34.0)
MCHC: 34 g/dL (ref 30.0–36.0)
MCV: 94.9 fL (ref 80.0–100.0)
Monocytes Absolute: 0.5 10*3/uL (ref 0.1–1.0)
Monocytes Relative: 8 %
Neutro Abs: 3.1 10*3/uL (ref 1.7–7.7)
Neutrophils Relative %: 47 %
Platelet Count: 239 10*3/uL (ref 150–400)
RBC: 3.96 MIL/uL (ref 3.87–5.11)
RDW: 14.5 % (ref 11.5–15.5)
WBC Count: 6.5 10*3/uL (ref 4.0–10.5)
nRBC: 0 % (ref 0.0–0.2)

## 2023-01-22 NOTE — Telephone Encounter (Signed)
Left message for the patient to call the clinic.

## 2023-01-22 NOTE — Telephone Encounter (Signed)
K+ was added to her meds b/c I increased her HCTZ. Placing her on potassium supplement was to avoid developing a low K+. Since she is not taking K+ supplement, she really should come in for a BMET to evaluate her K+ sometime this week. Tereso Newcomer, PA-C    01/22/2023 2:43 PM

## 2023-01-22 NOTE — Telephone Encounter (Signed)
Patient is returning call. Please advise? 

## 2023-01-22 NOTE — Telephone Encounter (Signed)
Patient stated she will not be able to come until next week. She ws just here and she states they have a hard time with transportation

## 2023-01-22 NOTE — Telephone Encounter (Signed)
Pt c/o medication issue:  1. Name of Medication:   Potassium Chloride 10 % SOLN    2. How are you currently taking this medication (dosage and times per day)?   Take 10 mEq by mouth daily.    3. Are you having a reaction (difficulty breathing--STAT)? No  4. What is your medication issue? Pt states that she has yet to start medication and would ike to know if she still needs to have labs done tomorrow. Please advise

## 2023-01-22 NOTE — Telephone Encounter (Signed)
Please arrange for next week. Tereso Newcomer, PA-C    01/22/2023 4:28 PM

## 2023-01-23 ENCOUNTER — Ambulatory Visit: Payer: Medicare Other | Attending: Physician Assistant

## 2023-01-24 ENCOUNTER — Encounter (HOSPITAL_COMMUNITY): Payer: Self-pay | Admitting: Cardiology

## 2023-01-24 ENCOUNTER — Ambulatory Visit (HOSPITAL_COMMUNITY)
Admission: RE | Admit: 2023-01-24 | Discharge: 2023-01-24 | Disposition: A | Discharge status: Home / Self Care | Payer: Medicare Other | Source: Ambulatory Visit | Attending: Cardiology | Admitting: Cardiology

## 2023-01-24 VITALS — BP 118/68 | HR 63 | Wt 119.0 lb

## 2023-01-24 DIAGNOSIS — Z950 Presence of cardiac pacemaker: Secondary | ICD-10-CM | POA: Insufficient documentation

## 2023-01-24 DIAGNOSIS — I272 Pulmonary hypertension, unspecified: Secondary | ICD-10-CM | POA: Insufficient documentation

## 2023-01-24 DIAGNOSIS — I110 Hypertensive heart disease with heart failure: Secondary | ICD-10-CM | POA: Insufficient documentation

## 2023-01-24 DIAGNOSIS — I082 Rheumatic disorders of both aortic and tricuspid valves: Secondary | ICD-10-CM | POA: Diagnosis not present

## 2023-01-24 DIAGNOSIS — I495 Sick sinus syndrome: Secondary | ICD-10-CM | POA: Diagnosis not present

## 2023-01-24 DIAGNOSIS — Z79899 Other long term (current) drug therapy: Secondary | ICD-10-CM | POA: Insufficient documentation

## 2023-01-24 DIAGNOSIS — I5033 Acute on chronic diastolic (congestive) heart failure: Secondary | ICD-10-CM

## 2023-01-24 DIAGNOSIS — I5030 Unspecified diastolic (congestive) heart failure: Secondary | ICD-10-CM | POA: Diagnosis not present

## 2023-01-24 DIAGNOSIS — I4819 Other persistent atrial fibrillation: Secondary | ICD-10-CM | POA: Diagnosis not present

## 2023-01-24 DIAGNOSIS — I38 Endocarditis, valve unspecified: Secondary | ICD-10-CM

## 2023-01-24 LAB — KAPPA/LAMBDA LIGHT CHAINS
Kappa free light chain: 55.4 mg/L — ABNORMAL HIGH (ref 3.3–19.4)
Kappa, lambda light chain ratio: 0.77 (ref 0.26–1.65)
Lambda free light chains: 72.1 mg/L — ABNORMAL HIGH (ref 5.7–26.3)

## 2023-01-24 MED ORDER — FUROSEMIDE 20 MG PO TABS: 20.0000 mg | ORAL_TABLET | Freq: Every day | ORAL | 3 refills | Status: DC

## 2023-01-24 MED ORDER — SPIRONOLACTONE 25 MG PO TABS: 12.5000 mg | ORAL_TABLET | Freq: Every day | ORAL | 3 refills | Status: DC

## 2023-01-24 NOTE — Progress Notes (Signed)
ADVANCED HEART FAILURE CLINIC NOTE  Referring Physician: Willow Ora, MD  Primary Care: Willow Ora, MD   HPI: Deanna Rivera is a 89 y.o. female with heart failure with preserved ejection fraction, persistent atrial fibrillation, sick sinus syndrome status post permanent pacemaker, moderate aortic stenosis and severe tricuspid regurgitation presenting today for further evaluation of pulmonary hypertension.  At baseline, Deanna Rivera is very limited functionally. She is wheelchair bound and has progressive neuromuscular disorder leading to lower extremity weakness. She has difficulty performing ADLs due to this. Over the past 1 year, echocardiograms have demonstrated moderate-severe biatrial enlargement, moderate-severe TR and findings consistent with Hfpef likely secondary to persistent atrial fibrillation. She has struggled with LE edema. She is very frail.   Past Medical History:  Diagnosis Date   Cataract    Hypertension    Moderate aortic stenosis    Moderate mitral regurgitation    Persistent atrial fibrillation (HCC)    PMR (polymyalgia rheumatica) (HCC)    Second degree AV block    Sick sinus syndrome (HCC)    Thyroid disease     Current Outpatient Medications  Medication Sig Dispense Refill   acetaminophen (TYLENOL) 500 MG tablet Take 500 mg by mouth every 6 (six) hours as needed for mild pain or headache.     apixaban (ELIQUIS) 2.5 MG TABS tablet Take 1 tablet (2.5 mg total) by mouth 2 (two) times daily. 60 tablet 0   Cholecalciferol (VITAMIN D3) 25 MCG (1000 UT) CAPS Take 1 capsule by mouth daily.     digoxin (LANOXIN) 0.125 MG tablet TAKE 1 TABLET BY MOUTH DAILY 90 tablet 3   dorzolamide-timolol (COSOPT) 22.3-6.8 MG/ML ophthalmic solution Place 1 drop into both eyes 2 (two) times daily.     hydrochlorothiazide (HYDRODIURIL) 25 MG tablet Take 1 tablet (25 mg total) by mouth daily. 90 tablet 3   latanoprost (XALATAN) 0.005 % ophthalmic solution Place 1 drop into  both eyes at bedtime.     levothyroxine (SYNTHROID) 112 MCG tablet TAKE 1 TABLET BY MOUTH DAILY  BEFORE BREAKFAST 90 tablet 2   metoprolol succinate (TOPROL-XL) 50 MG 24 hr tablet TAKE 1 TABLET BY MOUTH DAILY 90 tablet 3   Multiple Vitamin (MULTIVITAMIN WITH MINERALS) TABS tablet Take 1 tablet by mouth daily.     polyethylene glycol (MIRALAX / GLYCOLAX) 17 g packet Take 17 g by mouth daily as needed for severe constipation.     Potassium Chloride 10 % SOLN Take 10 mEq by mouth daily. 250 mL 0   predniSONE (DELTASONE) 1 MG tablet TAKE 1 TABLET BY MOUTH DAILY 90 tablet 3   No current facility-administered medications for this encounter.    Allergies  Allergen Reactions   Shellfish-Derived Products Shortness Of Breath and Other (See Comments)    Lips go numb   Epinephrine Other (See Comments) and Hypertension    "Makes me feel wired"   Monosodium Glutamate Other (See Comments)    other   Monosodium Glutamate Hypertension and Other (See Comments)    "Makes me feel badly"  Other Reaction(s): Other (See Comments)  other, other   Ciprofloxacin Other (See Comments)    Allergic reaction not recalled  Other Reaction(s): Other (See Comments)  Other reaction(s): Intolerance   Dronedarone     Other Reaction(s): Other (See Comments)  Leg swelling, Leg swelling   Erythromycin Other (See Comments)    Allergic reaction not recalled   Sulfa Antibiotics Other (See Comments)    Allergic reaction not recalled  Sulfamethoxazole-Trimethoprim    Tetracyclines & Related Other (See Comments)    Allergic reaction not recalled      Social History   Socioeconomic History   Marital status: Married    Spouse name: Not on file   Number of children: Not on file   Years of education: Not on file   Highest education level: Not on file  Occupational History   Occupation: Retired     Comment: Midwife   Tobacco Use   Smoking status: Former    Types: Cigarettes   Smokeless tobacco:  Never  Substance and Sexual Activity   Alcohol use: Not Currently   Drug use: Not Currently   Sexual activity: Not Currently    Birth control/protection: Post-menopausal  Other Topics Concern   Not on file  Social History Narrative   ** Merged History Encounter **       Recently moved from [place]. In independent living with her husband. Daughter died from scleroderma complications. Hx of falls, but last was > 1.5 years ago. 01/18/2019    Social Determinants of Health   Financial Resource Strain: Low Risk  (11/14/2021)   Overall Financial Resource Strain (CARDIA)    Difficulty of Paying Living Expenses: Not hard at all  Food Insecurity: No Food Insecurity (11/04/2022)   Hunger Vital Sign    Worried About Running Out of Food in the Last Year: Never true    Ran Out of Food in the Last Year: Never true  Transportation Needs: No Transportation Needs (11/04/2022)   PRAPARE - Administrator, Civil Service (Medical): No    Lack of Transportation (Non-Medical): No  Physical Activity: Inactive (11/14/2021)   Exercise Vital Sign    Days of Exercise per Week: 0 days    Minutes of Exercise per Session: 0 min  Stress: Stress Concern Present (11/14/2021)   [employer] of Occupational Health - Occupational Stress Questionnaire    Feeling of Stress : Rather much  Social Connections: Moderately Integrated (11/14/2021)   Social Connection and Isolation Panel [NHANES]    Frequency of Communication with Friends and Family: More than three times a week    Frequency of Social Gatherings with Friends and Family: More than three times a week    Attends Religious Services: More than 4 times per year    Active Member of [employer] or Organizations: No    Attends Banker Meetings: Never    Marital Status: Married  Catering manager Violence: Not At Risk (11/04/2022)   Humiliation, Afraid, Rape, and Kick questionnaire    Fear of Current or Ex-Partner: No    Emotionally Abused: No     Physically Abused: No    Sexually Abused: No      Family History  Problem Relation Age of Onset   Early death Mother 66   Hypertension Brother    Cirrhosis Father 91   Lung cancer Brother 64   Scleroderma Daughter     PHYSICAL EXAM: Vitals:   01/24/23 1135  BP: 118/68  Pulse: 63  SpO2: 97%   GENERAL: elderly, chronically ill appear WF in wheelchair HEENT: Negative for arcus senilis or xanthelasma. There is no scleral icterus.  The mucous membranes are pink and moist.   NECK: Supple, No masses. Normal carotid upstrokes without bruits. No masses or thyromegaly.    CHEST: There are no chest wall deformities. There is no chest wall tenderness. Respirations are unlabored.  Lungs- decreased b/l with crackles CARDIAC:  JVP: 10  cm H2O         Normal S1, S2  Normal rate with regular rhythm. No murmurs, rubs or gallops.  Pulses are 2+ and symmetrical in upper and lower extremities. +2 edema.  ABDOMEN: Soft, non-tender, non-distended. There are no masses or hepatomegaly. There are normal bowel sounds.  EXTREMITIES: Warm and well perfused with no cyanosis, clubbing.  LYMPHATIC: No axillary or supraclavicular lymphadenopathy.  NEUROLOGIC: Patient is oriented x3 with no focal or lateralizing neurologic deficits.  PSYCH: Patients affect is appropriate, there is no evidence of anxiety or depression.  SKIN: Warm and dry; no lesions or wounds.   DATA REVIEW  ECG: 01/25/23: Ventricular paced rhythm as per my personal interpretation  ECHO: LVEF 65 to 70%, moderate to severe tricuspid regurgitation, normal RV function, moderate mitral regurgitation as per my personal interpretation  ASSESSMENT & PLAN:  Heart failure with preserved ejection fraction -Start spironolactone 12.5 mg daily -Start Lasix 40 mg daily for 7 days then reduce to 20 mg daily -Can discontinue potassium supplementation after 7 days  2.  Evaluation for pulmonary hypertension -Patient potentially has underlying moderate  pulmonary hypertension from longstanding elevated LA pressures (group 2 PH).  However, due to her multiple comorbid conditions she would likely not tolerate PAH therapy well.  At this time would focus on treatment of her HFpEF and volume overload. - As noted above, increase lasix for next 7 days. Repeat labs at her cardiology follow up.  - start spironolactone 12.5mg  daily - She is  non-ambulatory; would target symptom control with diuretics.  - Can consider palliative care conversations if symptoms do not improve with diuretics. At this time she is not a candidate for advanced therapies.    Deanna Rivera Advanced Heart Failure Mechanical Circulatory Support

## 2023-01-24 NOTE — Patient Instructions (Addendum)
START Spironolactone 12.5 mg (1/2 Tab) daily.  START Lasix 40 mg for 7 days, then go to 20 mg daily after that.  You can stop your Potassium after the 7 days of lasix.  Your physician recommends that you schedule a follow-up appointment in: as needed.  If you have any questions or concerns before your next appointment please send [place] a message through [place] or call our office at [phone].    TO LEAVE A MESSAGE FOR THE NURSE SELECT OPTION 2, PLEASE LEAVE A MESSAGE INCLUDING: YOUR NAME DATE OF BIRTH CALL BACK NUMBER REASON FOR CALL**this is important as we prioritize the call backs  YOU WILL RECEIVE A CALL BACK THE SAME DAY AS LONG AS YOU CALL BEFORE 4:00 PM  At the Advanced Heart Failure Clinic, you and your health needs are our priority. As part of our continuing mission to provide you with exceptional heart care, we have created designated Provider Care Teams. These Care Teams include your primary Cardiologist (physician) and Advanced Practice Providers (APPs- Physician Assistants and Nurse Practitioners) who all work together to provide you with the care you need, when you need it.   You may see any of the following providers on your designated Care Team at your next follow up: Dr Arvilla Meres Dr Marca Ancona Dr. Marcos Eke, NP Robbie Lis, [place] [facility] [place], [place] Brynda Peon, NP Karle Plumber, PharmD   Please be sure to bring in all your medications bottles to every appointment.    Thank you for choosing  [facility]

## 2023-01-25 LAB — PROTEIN ELECTROPHORESIS, SERUM
A/G Ratio: 0.7 (ref 0.7–1.7)
Albumin ELP: 3.2 g/dL (ref 2.9–4.4)
Alpha-1-Globulin: 0.3 g/dL (ref 0.0–0.4)
Alpha-2-Globulin: 0.7 g/dL (ref 0.4–1.0)
Beta Globulin: 1.4 g/dL — ABNORMAL HIGH (ref 0.7–1.3)
Gamma Globulin: 2.1 g/dL — ABNORMAL HIGH (ref 0.4–1.8)
Globulin, Total: 4.4 g/dL — ABNORMAL HIGH (ref 2.2–3.9)
M-Spike, %: 0.4 g/dL — ABNORMAL HIGH
Total Protein ELP: 7.6 g/dL (ref 6.0–8.5)

## 2023-01-28 ENCOUNTER — Telehealth (HOSPITAL_COMMUNITY): Payer: Self-pay | Admitting: Cardiology

## 2023-01-28 DIAGNOSIS — I5032 Chronic diastolic (congestive) heart failure: Secondary | ICD-10-CM

## 2023-01-28 NOTE — Telephone Encounter (Signed)
Pt called to schedule repeat labs with AHF clinic Appt made

## 2023-01-30 ENCOUNTER — Ambulatory Visit (HOSPITAL_COMMUNITY)
Admission: RE | Admit: 2023-01-30 | Discharge: 2023-01-30 | Disposition: A | Discharge status: Home / Self Care | Payer: Medicare Other | Source: Ambulatory Visit | Attending: Internal Medicine | Admitting: Internal Medicine

## 2023-01-30 ENCOUNTER — Telehealth: Payer: Self-pay | Admitting: *Deleted

## 2023-01-30 DIAGNOSIS — I5032 Chronic diastolic (congestive) heart failure: Secondary | ICD-10-CM | POA: Insufficient documentation

## 2023-01-30 LAB — BASIC METABOLIC PANEL
Anion gap: 7 (ref 5–15)
BUN: 13 mg/dL (ref 8–23)
CO2: 28 mmol/L (ref 22–32)
Calcium: 9.8 mg/dL (ref 8.9–10.3)
Chloride: 99 mmol/L (ref 98–111)
Creatinine, Ser: <0.3 mg/dL — ABNORMAL LOW (ref 0.44–1.00)
Glucose, Bld: 91 mg/dL (ref 70–99)
Potassium: 4.3 mmol/L (ref 3.5–5.1)
Sodium: 134 mmol/L — ABNORMAL LOW (ref 135–145)

## 2023-01-30 NOTE — Telephone Encounter (Signed)
Left voicemail for patient to return call to office. 

## 2023-01-30 NOTE — Telephone Encounter (Signed)
-----   Message from Ladene Artist, MD sent at 01/29/2023  6:43 AM EDT ----- Please call patient, labs are stable, the serum monoclonal protein level is stable, she is not anemic. I have a low suspicion for multiple myeloma.  She can f/u with primary MD and here as needed.

## 2023-01-30 NOTE — Telephone Encounter (Signed)
Notified Deanna Rivera that her  labs are stable, the serum monoclonal protein level is stable, she is not anemic. Dr. Truett Perna has a low suspicion for multiple myeloma.  She can f/u with primary MD and here as needed. She would like to cancel her 6/27 and have her PCP follow. Appreciates the call

## 2023-01-31 NOTE — Progress Notes (Signed)
I saw Deanna Rivera in neurology clinic on 02/13/23 in follow up for progressive weakness.  HPI: Deanna Rivera is a 89 y.o. year old female with a history of HTN, CHF, afib s/p PM, hypothyroidism (Hashimoto's), polymyalgia rheumatica, and prior presumptive diagnosis of inclusion body myositis who we last saw on 08/14/22.  To briefly review: Initial consult (08/14/22): Patient had symptoms since at least 1970s when she was having weakness. She was told she had lupus. She had improvement of symptoms. She had weakness for many years at least prior to 2009. Per records: Patient has had symptoms for many years. She has previously been seen by neurology at [facility] (Dr. Remus Loffler 02/05/2008), and [facility]. She had an EMG in 2009 that showed myopathic changes in multiple muscles and a muscle biopsy that showed inflammatory infiltrates per documentation from [facility] in 2019. 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 She may have also been diagnosed with polymyalgia rheumatica or polymyositis in the past as well, per patient. Of note, CK in 2019 was 404.   Patient is currently in wheelchair most of the time for about 3 months. Prior to this, patient was using a walker. When she using her walker, she would fall very often (more than 60 times). She was progressively getting weaker over the years. About 1 year ago she fell and broke a T9 vertebrae. Since this, she has particularly gotten worse. She was told to use a wheelchair to prevent falls. Patient continues to work with PT but is not improving. Patient was referred to NSGY by PCP ([facility]) who said the fracture had healed but that there was degenerative changes and "things did not heal well." She is not a surgical candidate per patient.    Patient has had difficulty swallowing for years (liquids and solids). Of late, it has been worse. Patient was found to have a left neck mass. Patient was to have a swallow test and CT of neck, but  has not done either.   Patient denies significant shortness of breath, including when laying flat.   Per patient, she has had longstanding numbness in feet (called it neuropathy).  She can have some pins and needles at night (mild).   She also endorses poor sleep. She is not sure why she does not sleep well, but says her husband has to get up multiple times per night that wakes patient and she cannot fall back asleep.   Patient currently lives in independent living. She does not like the food and sometimes misses meals. She denies significant weight loss. She gets PT at her facility.   Family history of neuropathy/myopathy/NM disease? Daughter had scleroderma and died at age 64.   Patient has been on long term steroids for decades. She is currently on 1 mg of prednisone daily.  Most recent Assessment and Plan (08/14/22): Deanna Rivera is a 89 y.o. female who presents for evaluation of progressive weakness. She has a relevant medical history of HTN, CHF, afib s/p PM, hypothyroidism (Hashimoto's), polymyalgia rheumatica, and prior presumptive diagnosis of inclusion body myositis. Her neurological examination is pertinent for generalized weakness both distally and proximally, diminished sensation in bilateral lower extremities, hyporeflexia, and inability to ambulate. Previous extensive work up is summarized above but includes MRI showing fatty infiltration of vastus lateralis and semimembranosus muscles. EMG in 2009 showed normal sensory responses, reduced motor responses, and fibrillation potentials on needle examination. Muscle biopsy in 2009 showed active inflammation and muscle deinnervation.  This is a complex case with multiple possible overlapping etiologies. Patient's symptoms have been present for decades and slowly progressive despite therapy. This is likely in keeping with previous diagnosis of inclusion body myositis (IBM). This may also be responsible for dysphagia, though there may be a  neck mass as well that could explain this. Patient also has a rheumatologic history (?SLE) and long term treatment with steroids, either of which could contribute. The lack of sensation in lower extremities and "neuropathy" patient describes would not be be expected in IBM though. There may be an overlapping polyneuropathy. I discussed repeating an EMG to clarify, but patient was not interested in pursuing this at this time. I will look for treatable causes and manage conservatively given the slow progressive nature and likely diagnosis of IBM.   PLAN: -Blood work: CK, B1, B12, IFE, ESR, ANA, ENA -Patient was to have a swallow test and CT of neck - ordered by PCP but not completed. I recommended she complete these. -Continue PT -Discussed EMG. Patient not interested in repeating EMG at this time.  Since their last visit: Per my telephone note from 08/21/22: Called patient to discuss the results of her testing. Overall, her CK and ESR are similar to prior. Her CK has been higher in the past. I think her muscle disease is slowly progressive, as would be expected in IBM (what has been her presumed diagnosis in the past). Her ANA was positive (1:1280) with a negative ENA. Patient does not currently see rheumatology. I discussed that they would be helpful to make sure there was not something we were missing. Patient was in agreement with this. 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 She is agreeable to hematology referral for this.  Patient saw hematology who had a low suspicion for MM and felt IFE represented MGUS.  Patient was admitted from 11/03/22 to 11/07/22 for weakness in the setting of recent flu A infection. Per DC summary:  SLP evaluated and patient was taken for an MBS and she is noted to have mild to moderate primary pharyngeal dysphagia is likely chronic in nature and related to other degenerative changes and spurring in the cervical vertebra as well as generalized weakness and oral  consent was largely functional. She had a markedly reduced laryngeal closure tongue base retraction and epiglottic movement of pharyngeal esophageal segment opening. SLP feels that she has been intermittently aspirating for some time and overall weakness has been exacerbated her swallowing difficulty and SLP feels like her dysphagia will wax and wane and the adverse consequences of aspiration will recur at some point in the future.   Patient went to rehab after hospitalization. She feels like baseline in some regards, but now has more difficulty getting up. She has days that she feels strong and some days she feel weak.   Regarding her swallowing, she does endorse difficulty with swallowing. She is choking daily, but maybe not every meal. Patient got speech therapy as well.  She is currently getting physical therapy and occupational therapy.  She has had leg swelling because medications had been stopped during hospitalization. She is now on diuretics.  Patient has an appointment to see rheumatology soon.   MEDICATIONS:  Outpatient Encounter Medications as of 02/13/2023  Medication Sig   acetaminophen (TYLENOL) 500 MG tablet Take 500 mg by mouth every 6 (six) hours as needed for mild pain or headache.   apixaban (ELIQUIS) 2.5 MG TABS tablet Take 1 tablet (2.5 mg total) by mouth 2 (two)  times daily.   Cholecalciferol (VITAMIN D3) 25 MCG (1000 UT) CAPS Take 1 capsule by mouth daily.   digoxin (LANOXIN) 0.125 MG tablet TAKE 1 TABLET BY MOUTH DAILY   dorzolamide-timolol (COSOPT) 22.3-6.8 MG/ML ophthalmic solution Place 1 drop into both eyes 2 (two) times daily.   furosemide (LASIX) 20 MG tablet Take 1 tablet (20 mg total) by mouth daily.   latanoprost (XALATAN) 0.005 % ophthalmic solution Place 1 drop into both eyes at bedtime.   levothyroxine (SYNTHROID) 112 MCG tablet TAKE 1 TABLET BY MOUTH DAILY  BEFORE BREAKFAST   metoprolol succinate (TOPROL-XL) 50 MG 24 hr tablet TAKE 1 TABLET BY MOUTH DAILY    polyethylene glycol (MIRALAX / GLYCOLAX) 17 g packet Take 17 g by mouth daily as needed for severe constipation.   predniSONE (DELTASONE) 1 MG tablet TAKE 1 TABLET BY MOUTH DAILY   spironolactone (ALDACTONE) 25 MG tablet Take 0.5 tablets (12.5 mg total) by mouth daily.   [DISCONTINUED] Multiple Vitamin (MULTIVITAMIN WITH MINERALS) TABS tablet Take 1 tablet by mouth daily.   [DISCONTINUED] Potassium Chloride 10 % SOLN Take 10 mEq by mouth daily.   No facility-administered encounter medications on file as of 02/13/2023.    PAST MEDICAL HISTORY: Past Medical History:  Diagnosis Date   Cataract    Hypertension    Moderate aortic stenosis    Moderate mitral regurgitation    Persistent atrial fibrillation (HCC)    PMR (polymyalgia rheumatica) (HCC)    Second degree AV block    Sick sinus syndrome (HCC)    Thyroid disease     PAST SURGICAL HISTORY: Past Surgical History:  Procedure Laterality Date   ABDOMINAL HYSTERECTOMY     APPENDECTOMY     CESAREAN SECTION     x 3   CHOLECYSTECTOMY     PACEMAKER GENERATOR CHANGE  11/20/2018   [employer] Accolade MRI PPM implanted in [place]   PACEMAKER IMPLANT     PILONIDAL CYST EXCISION      ALLERGIES: Allergies  Allergen Reactions   Shellfish-Derived Products Shortness Of Breath and Other (See Comments)    Lips go numb   Epinephrine Other (See Comments) and Hypertension    "Makes me feel wired"   Monosodium Glutamate Other (See Comments)    other   Monosodium Glutamate Hypertension and Other (See Comments)    "Makes me feel badly"  Other Reaction(s): Other (See Comments)  other, other   Ciprofloxacin Other (See Comments)    Allergic reaction not recalled  Other Reaction(s): Other (See Comments)  Other reaction(s): Intolerance   Dronedarone     Other Reaction(s): Other (See Comments)  Leg swelling, Leg swelling   Erythromycin Other (See Comments)    Allergic reaction not recalled   Sulfa Antibiotics Other (See  Comments)    Allergic reaction not recalled   Sulfamethoxazole-Trimethoprim    Tetracyclines & Related Other (See Comments)    Allergic reaction not recalled    FAMILY HISTORY: Family History  Problem Relation Age of Onset   Early death Mother 57   Hypertension Brother    Cirrhosis Father 26   Lung cancer Brother 60   Scleroderma Daughter     SOCIAL HISTORY: Social History   Tobacco Use   Smoking status: Former    Types: Cigarettes   Smokeless tobacco: Never  Substance Use Topics   Alcohol use: Not Currently   Drug use: Not Currently   Social History   Social History Narrative   ** Merged History Encounter **  Recently moved from [place]. In independent living with her husband. Daughter died from scleroderma complications. Hx of falls, but last was > 1.5 years ago. 01/18/2019     Objective:  Vital Signs:  BP 127/72   Pulse 63   Ht 5\' 4"  (1.626 m)   Wt 119 lb (54 kg)   SpO2 96%   BMI 20.43 kg/m   General: General appearance: Awake and alert. No distress. Cooperative with exam.  Skin: No obvious rash or jaundice. HEENT: Atraumatic. Anicteric. Lungs: Non-labored breathing on room air  Extremities: Pitting edema of bilateral lower extremities  Neurological: Mental Status: Alert. Speech fluent. No pseudobulbar affect Cranial Nerves: CNII: No RAPD. Visual fields intact. CNIII, IV, VI: PERRL. No nystagmus. EOMI. CN V: Facial sensation intact bilaterally to fine touch CN VII: Facial muscles symmetric and strong. No ptosis at rest CN VIII: Hears finger rub well bilaterally. CN IX: No hypophonia. CN X: Palate elevates symmetrically. CN XI: Full strength shoulder shrug bilaterally. CN XII: Tongue protrusion full and midline. No atrophy or fasciculations. Mild dysarthria Motor: Tone is normal. Atrophy of bilateral forearms and thighs.  Individual muscle group testing (MRC grade out of 5):  Movement     Neck flexion 4    Neck extension 5     Right  Left   Shoulder abduction 4- 4-   Elbow flexion 4 4   Elbow extension 4 4   Finger abduction - FDI 4- 4-   Finger abduction - ADM 4- 4-   Finger extension 4- 4-   Finger distal flexion - 2/3 4- 4-   Finger distal flexion - 4/5 4- 4-   Thumb flexion - FPL 3 2    Hip flexion 4 4   Hip extension 5 5   Hip adduction 5 5   Hip abduction 5 5   Knee extension 5 5   Knee flexion 4 4   Dorsiflexion 3 3   Plantarflexion 5- 5-    Reflexes: Trace throughout Sensation: Pinprick: Diminished in distal lower extremities Gait: Unable to ambulate safely   Lab and Test Review: New results: 08/14/22: IFE: IgG lambda monoclonal ab ANA positive: 1:1280, homogeneous dsDNA, RNP, Sm, Scl-70, SSA, SSB negative B1 wnl B12: 493 CK: 251 ESR: 73  Kappa/lambda ratio wnl  CK (10/29/22): 121 TSH (10/29/22): 0.501 ESR (11/04/22): 58 CK (11/04/22): 61  Swallow evaluation (11/05/22): Clinical Impression: Clinical Impression: Ms. Milone presents with a mild-moderate primary pharyngeal dysphagia that is likely chronic in nature and related to degenerative changes and spurring in cervical vertebrae as well as generalized weakness.  Oral phase was largely functional.  Pharyngeal phase was marked by reduced laryngeal closure, tongue base retraction, epiglottic movement, and pharyngoesophageal segment opening.  These impairments in the swallow were partially due to appearance of osteophytes which inhibited full inversion of epiglottis over larynx.  There was a prominent CP, leading to material collecting above.  Reduced propulsion through the pharynx/PES was also related to inferred weakness of the base of tongue/pharyngeal wall. There was consistent penetration of thin liquids, often to the level of the vocal folds. Penetration elicited a frequent cough response; there was minimal aspiration.  Solid food residue tended to remain in the valleculae and was difficult to transition.  A chin tuck was trialed, it may have  offered marginal benefit in bolus transfer of solids and airway protection with thin liquids. Spent time after the study reviewing imaging in pt's room and discussing results with pt/husband.  She should try  tucking chin with all solids/liquids. Continue current diet (regular solids/thin liquids); Pt and husband will need a lot of reinforcement to recall results and precautions. SLP will follow while admitted.  Recommendations/Plan: Swallowing Evaluation Recommendations Swallowing Evaluation Recommendations Recommendations: PO diet PO Diet Recommendation: Regular; Thin liquids (Level 0) Liquid Administration via: Cup; Straw Medication Administration: Whole meds with puree Supervision: Patient able to self-feed Swallowing strategies  : Chin tuck Postural changes: Position pt fully upright for meals  CT head (11/04/22): IMPRESSION: No acute intracranial abnormality noted.   Chronic ischemic changes are noted.  Echo (10/16/22): 1. Left ventricular ejection fraction, by estimation, is 65 to 70%. The  left ventricle has normal function. The left ventricle has no regional  wall motion abnormalities. Left ventricular diastolic function could not  be evaluated.   2. Right ventricular systolic function is normal. The right ventricular  size is normal. There is severely elevated pulmonary artery systolic  pressure. The estimated right ventricular systolic pressure is 62.1 mmHg.   3. Left atrial size was severely dilated.   4. Right atrial size was severely dilated.   5. The mitral valve is normal in structure. Mild to moderate mitral valve  regurgitation.   6. Tricuspid valve regurgitation is moderate to severe.   7. The aortic valve is tricuspid. There is mild calcification of the  aortic valve. There is moderate thickening of the aortic valve. Aortic  valve regurgitation is trivial. Moderate aortic valve stenosis.   8. The inferior vena cava is dilated in size with <50% respiratory   variability, suggesting right atrial pressure of 15 mmHg.   Previously reviewed results: External labs: Per [facility] clinic note from 02/05/2008: CK: since 2002, levels in the 192 to 440 (440 in January 2009. December 03 2007 (5 days post emg) 268 ESR remains high since 2002 (44-82)  EMG elsewhere (april 17th 2009) Normal upper extremities sensory nerve responses, but for prolongation of median latency, low motor amplitudes ulnar and tibial, needle electrode examination shows fibrillations and positive sharp waves and small motor unit potentials in all muscles tested (proximal and paraspinals- no hand-forearm muscles).  US neck (may 2009): multinodular goiter  MRI left leg (february 2009) Signal abn in vastus lateralis, fatty infiltration in vastus lat and semimembranosus Right leg: vastus changes  Muscle biopsy (april 1st 2009) Active inflammation with muscle fiber destruction and invasion by lymphocytes, mild type 2 atrophy, stains normal, no inclusion (no EM done)  Blood tests June 15th Cmp normal Ck 124 Crp N ESR 44 No monoclonal protein in blood and urine Hepatitis B serology- no exposure ANA, ENA, RF negative TSH 0.27 PTH 72 (could indicate low vitamin D) CBC N Urinalysis normal    Imaging: CT thoracic spine wo contrast (07/11/22): IMPRESSION: Chronic T8 compression fracture with progressive anterior height loss, measuring up to 60%, previously 20% on CT June 2022, with exaggerated thoracic kyphosis centered at T8. Unchanged 3 mm inferior endplate retropulsion with resultant mild spinal canal stenosis at this level.   No new compression fracture.   Mild multilevel degenerative changes of the thoracic spine.   3 mm pulmonary nodule in the left upper lobe. No follow-up needed if patient is low-risk. A 12 month follow-up chest CT can be considered if the patient is high-risk.This recommendation follows the consensus statement: Guidelines for Management of  Incidental Pulmonary Nodules Detected on CT Images: From the Fleischner Society 2017; Radiology 2017; 284:228-243.   CT cervical spine wo contrast (08/09/21): IMPRESSION: 1. Slight anterior subluxations at C2-3  and C3-4, similar to prior study, likely degenerative. 2. No acute displaced fractures identified. 3. Diffuse degenerative changes.   CT head wo contrast (09/04/21): IMPRESSION: Atrophy, chronic microvascular disease.   No acute intracranial abnormality.  ASSESSMENT: This is Deanna Rivera, a 89 y.o. female with progressive weakness since at least the 1970s, with recent worsening after flu and pneumonia.   Her neurological examination is pertinent for generalized weakness both distally and proximally, diminished sensation in bilateral lower extremities, hyporeflexia, and inability to ambulate. Previous extensive work up is summarized above but includes MRI showing fatty infiltration of vastus lateralis and semimembranosus muscles. EMG in 2009 showed normal sensory responses, reduced motor responses, and fibrillation potentials on needle examination. Muscle biopsy in 2009 showed active inflammation and muscle deinnervation. She was given the presumptive diagnosis of inclusion body myositis. Patient is currently wheelchair bound since late 2023.    Patient's symptoms have been present for decades and slowly progressive despite therapy. This is likely in keeping with previous diagnosis of inclusion body myositis (IBM). This may also be responsible for dysphagia, though there may be a neck mass as well. Patient also has a rheumatologic history (?SLE) and long term treatment with steroids, either of which could contribute. The lack of sensation in lower extremities and "neuropathy" patient describes would be be expected in IBM though. There may be an overlapping polyneuropathy. We previously discussed repeating an EMG to clarify, but patient was not interested in pursuing that.  Her recent  worsening is likely secondary to flu and PNA and deconditioning that occurred during hospitalization. She has been doing therapy and seeing some improvement, but not back to her more recent baseline.  Plan: -Discussed PEG due to dysphagia. Patient would not want current. She understands the risks of aspiration. She will continue to think about it, but defers for now. -Continue PT/OT -Speech therapy -Power chair evaluation - patient would benefit from power chair given her limited mobility. I will attempt to order. -See rheumatology next month as planned  Return to clinic in 3 months  Total time spent reviewing records, interview, history/exam, documentation, and coordination of care on day of encounter:  40 min  Jacquelyne Balint, MD

## 2023-02-06 ENCOUNTER — Ambulatory Visit (INDEPENDENT_AMBULATORY_CARE_PROVIDER_SITE_OTHER): Payer: Medicare Other

## 2023-02-06 ENCOUNTER — Other Ambulatory Visit (HOSPITAL_COMMUNITY): Payer: Medicare Other

## 2023-02-06 DIAGNOSIS — I495 Sick sinus syndrome: Secondary | ICD-10-CM | POA: Diagnosis not present

## 2023-02-06 LAB — CUP PACEART REMOTE DEVICE CHECK
Battery Remaining Longevity: 144 mo
Battery Remaining Percentage: 100 %
Brady Statistic RA Percent Paced: 0 %
Brady Statistic RV Percent Paced: 64 %
Date Time Interrogation Session: 20240626064100
Implantable Lead Connection Status: 753985
Implantable Lead Connection Status: 753985
Implantable Lead Implant Date: 20130305
Implantable Lead Implant Date: 20130305
Implantable Lead Location: 753859
Implantable Lead Location: 753860
Implantable Lead Model: 4469
Implantable Lead Model: 4470
Implantable Lead Serial Number: 546229
Implantable Lead Serial Number: 712016
Implantable Pulse Generator Implant Date: 20200409
Lead Channel Impedance Value: 410 Ohm
Lead Channel Pacing Threshold Amplitude: 0.6 V
Lead Channel Pacing Threshold Pulse Width: 0.4 ms
Lead Channel Setting Pacing Amplitude: 1.2 V
Lead Channel Setting Pacing Pulse Width: 0.4 ms
Lead Channel Setting Sensing Sensitivity: 0.6 mV
Pulse Gen Serial Number: 859007
Zone Setting Status: 755011

## 2023-02-07 ENCOUNTER — Inpatient Hospital Stay: Payer: Medicare Other

## 2023-02-07 ENCOUNTER — Inpatient Hospital Stay: Payer: Medicare Other | Admitting: Oncology

## 2023-02-07 ENCOUNTER — Ambulatory Visit (INDEPENDENT_AMBULATORY_CARE_PROVIDER_SITE_OTHER): Payer: Medicare Other

## 2023-02-07 VITALS — Wt 119.0 lb

## 2023-02-07 DIAGNOSIS — Z0000 Encounter for general adult medical examination without abnormal findings: Secondary | ICD-10-CM

## 2023-02-07 NOTE — Progress Notes (Signed)
Subjective:   Deanna Rivera is a 89 y.o. female who presents for Medicare Annual (Subsequent) preventive examination.  Visit Complete: Virtual  I connected with  Deanna Rivera on 02/07/23 by a audio enabled telemedicine application and verified that I am speaking with the correct person using two identifiers.  Patient Location: Home  Provider Location: Office/Clinic  I discussed the limitations of evaluation and management by telemedicine. The patient expressed understanding and agreed to proceed.   Review of Systems     Cardiac Risk Factors include: advanced age (>75men, >53 women);hypertension     Objective:    Today's Vitals   02/07/23 1534  Weight: 119 lb (54 kg)   Body mass index is 20.43 kg/m.     02/07/2023    3:40 PM 11/04/2022    6:00 AM 10/29/2022   10:48 AM 09/27/2022    1:38 PM 08/14/2022   10:11 AM 05/11/2022    5:23 PM 02/12/2022    3:13 PM  Advanced Directives  Does Patient Have a Medical Advance Directive? No No No Yes Yes Yes No  Type of Aeronautical engineer of [place];Living will Healthcare Power of [place];Living will Healthcare Power of [place];Living will   Does patient want to make changes to medical advance directive?    No - Patient declined     Copy of Healthcare Power of Attorney in Chart?    No - copy requested     Would patient like information on creating a medical advance directive? No - Patient declined No - Patient declined No - Patient declined    No - Patient declined    Current Medications (verified) Outpatient Encounter Medications as of 02/07/2023  Medication Sig   acetaminophen (TYLENOL) 500 MG tablet Take 500 mg by mouth every 6 (six) hours as needed for mild pain or headache.   apixaban (ELIQUIS) 2.5 MG TABS tablet Take 1 tablet (2.5 mg total) by mouth 2 (two) times daily.   Cholecalciferol (VITAMIN D3) 25 MCG (1000 UT) CAPS Take 1 capsule by mouth daily.   digoxin (LANOXIN) 0.125 MG tablet TAKE 1 TABLET BY  MOUTH DAILY   dorzolamide-timolol (COSOPT) 22.3-6.8 MG/ML ophthalmic solution Place 1 drop into both eyes 2 (two) times daily.   furosemide (LASIX) 20 MG tablet Take 1 tablet (20 mg total) by mouth daily.   latanoprost (XALATAN) 0.005 % ophthalmic solution Place 1 drop into both eyes at bedtime.   levothyroxine (SYNTHROID) 112 MCG tablet TAKE 1 TABLET BY MOUTH DAILY  BEFORE BREAKFAST   metoprolol succinate (TOPROL-XL) 50 MG 24 hr tablet TAKE 1 TABLET BY MOUTH DAILY   polyethylene glycol (MIRALAX / GLYCOLAX) 17 g packet Take 17 g by mouth daily as needed for severe constipation.   predniSONE (DELTASONE) 1 MG tablet TAKE 1 TABLET BY MOUTH DAILY   spironolactone (ALDACTONE) 25 MG tablet Take 0.5 tablets (12.5 mg total) by mouth daily.   [DISCONTINUED] Multiple Vitamin (MULTIVITAMIN WITH MINERALS) TABS tablet Take 1 tablet by mouth daily.   [DISCONTINUED] Potassium Chloride 10 % SOLN Take 10 mEq by mouth daily.   No facility-administered encounter medications on file as of 02/07/2023.    Allergies (verified) Shellfish-derived products, Epinephrine, Monosodium glutamate, Monosodium glutamate, Ciprofloxacin, Dronedarone, Erythromycin, Sulfa antibiotics, Sulfamethoxazole-trimethoprim, and Tetracyclines & related   History: Past Medical History:  Diagnosis Date   Cataract    Hypertension    Moderate aortic stenosis    Moderate mitral regurgitation    Persistent atrial fibrillation (HCC)  PMR (polymyalgia rheumatica) (HCC)    Second degree AV block    Sick sinus syndrome (HCC)    Thyroid disease    Past Surgical History:  Procedure Laterality Date   ABDOMINAL HYSTERECTOMY     APPENDECTOMY     CESAREAN SECTION     x 3   CHOLECYSTECTOMY     PACEMAKER GENERATOR CHANGE  11/20/2018   [employer] Accolade MRI PPM implanted in [place]   PACEMAKER IMPLANT     PILONIDAL CYST EXCISION     Family History  Problem Relation Age of Onset   Early death Mother 7   Hypertension Brother     Cirrhosis Father 54   Lung cancer Brother 34   Scleroderma Daughter    Social History   Socioeconomic History   Marital status: Married    Spouse name: Not on file   Number of children: Not on file   Years of education: Not on file   Highest education level: Not on file  Occupational History   Occupation: Retired     Comment: Midwife   Tobacco Use   Smoking status: Former    Types: Cigarettes   Smokeless tobacco: Never  Substance and Sexual Activity   Alcohol use: Not Currently   Drug use: Not Currently   Sexual activity: Not Currently    Birth control/protection: Post-menopausal  Other Topics Concern   Not on file  Social History Narrative   ** Merged History Encounter **       Recently moved from [place]. In independent living with her husband. Daughter died from scleroderma complications. Hx of falls, but last was > 1.5 years ago. 01/18/2019    Social Determinants of Health   Financial Resource Strain: Low Risk  (02/07/2023)   Overall Financial Resource Strain (CARDIA)    Difficulty of Paying Living Expenses: Not hard at all  Food Insecurity: No Food Insecurity (02/07/2023)   Hunger Vital Sign    Worried About Running Out of Food in the Last Year: Never true    Ran Out of Food in the Last Year: Never true  Transportation Needs: No Transportation Needs (02/07/2023)   PRAPARE - Administrator, Civil Service (Medical): No    Lack of Transportation (Non-Medical): No  Physical Activity: Sufficiently Active (02/07/2023)   Exercise Vital Sign    Days of Exercise per Week: 5 days    Minutes of Exercise per Session: 30 min  Stress: Stress Concern Present (02/07/2023)   [employer] of Occupational Health - Occupational Stress Questionnaire    Feeling of Stress : To some extent  Social Connections: Moderately Integrated (02/07/2023)   Social Connection and Isolation Panel [NHANES]    Frequency of Communication with Friends and Family: More  than three times a week    Frequency of Social Gatherings with Friends and Family: More than three times a week    Attends Religious Services: More than 4 times per year    Active Member of [employer] or Organizations: No    Attends Engineer, structural: Never    Marital Status: Married    Tobacco Counseling Counseling given: Not Answered   Clinical Intake:  Pre-visit preparation completed: Yes  Pain : No/denies pain     BMI - recorded: 20.43 Nutritional Status: BMI of 19-24  Normal Nutritional Risks: None Diabetes: No  How often do you need to have someone help you when you read instructions, pamphlets, or other written materials from your doctor or pharmacy?:  1 - Never  Interpreter Needed?: No  Information entered by :: Lanier Ensign, LPN   Activities of Daily Living    02/07/2023    3:41 PM 11/04/2022    5:56 AM  In your present state of health, do you have any difficulty performing the following activities:  Hearing? 1   Comment hearing aids   Vision? 0   Difficulty concentrating or making decisions? 0   Walking or climbing stairs? 0   Comment no stairs   Dressing or bathing? 0   Doing errands, shopping? 0 0  Preparing Food and eating ? N   Comment assisted living   Using the Toilet? Y   Comment assistant   In the past six months, have you accidently leaked urine? Y   Comment wears a pad   Do you have problems with loss of bowel control? N   Managing your Medications? N   Managing your Finances? N   Housekeeping or managing your Housekeeping? Y     Patient Care Team: Willow Ora, MD as PCP - General (Family Medicine) Marinus Maw, MD as PCP - Electrophysiology (Cardiology) Hillis Range, MD (Inactive) as Consulting Physician (Cardiology) Marcella Dubs as Physician Assistant (Orthopedic Surgery) Francee Gentile, MD as Consulting Physician (Rheumatology) Willow Ora, MD as Attending Physician (Family Medicine) Erroll Luna, [facility] (Inactive) as Pharmacist (Pharmacist) Antony Madura, MD as Consulting Physician (Neurology)  Indicate any recent Medical Services you may have received from other than [facility] providers in the past year (date may be approximate).     Assessment:   This is a routine wellness examination for [facility].  Hearing/Vision screen Hearing Screening - Comments:: Pt wears hearing aids  Vision Screening - Comments:: Pt follows up with Dr Dione Booze for annual eye exams   Dietary issues and exercise activities discussed:     Goals Addressed             This Visit's Progress    Patient Stated       Stay healthy        Depression Screen    02/07/2023    3:38 PM 11/02/2022    4:13 PM 09/27/2022    1:45 PM 08/29/2022    2:53 PM 05/30/2022    2:26 PM 05/15/2022    1:26 PM 04/12/2022   10:21 AM  PHQ 2/9 Scores  PHQ - 2 Score 0 1 2 0 0 0 0  PHQ- 9 Score  11 4  0 0 0    Fall Risk    02/07/2023    3:40 PM 11/02/2022    4:12 PM 08/29/2022    2:53 PM 08/14/2022   10:11 AM 05/30/2022    2:25 PM  Fall Risk   Falls in the past year? 1 1 0 1 0  Number falls in past yr: 1 1 0 1 0  Injury with Fall? 0 1 0 0 0  Risk for fall due to : Impaired vision;Impaired mobility Impaired balance/gait No Fall Risks  History of fall(s)  Follow up Falls prevention discussed Falls prevention discussed Falls evaluation completed  Falls evaluation completed    MEDICARE RISK AT HOME:  Medicare Risk at Home - 02/07/23 1543     Any stairs in or around the home? No    If so, are there any without handrails? No    Home free of loose throw rugs in walkways, pet beds, electrical cords, etc? Yes    Adequate lighting in  your home to reduce risk of falls? Yes    Life alert? Yes    Use of a cane, walker or w/c? Yes    Grab bars in the bathroom? Yes    Shower chair or bench in shower? Yes    Elevated toilet seat or a handicapped toilet? Yes             TIMED UP AND GO:  Was the test performed?  No     Cognitive Function:        02/07/2023    3:44 PM 11/14/2021   10:18 AM 11/12/2019   11:38 AM  6CIT Screen  What Year? 0 points 0 points 0 points  What month? 0 points 0 points 0 points  What time? 0 points 0 points 0 points  Count back from 20 0 points 0 points 0 points  Months in reverse 0 points 0 points 0 points  Repeat phrase 2 points 0 points 0 points  Total Score 2 points 0 points 0 points    Immunizations Immunization History  Administered Date(s) Administered   Fluad Quad(high Dose 65+) 05/13/2020   Moderna Sars-Covid-2 Vaccination 08/12/2019, 09/09/2019, 06/14/2020   Pneumococcal Conjugate-13 04/28/2019   Tdap 05/12/2021, 09/04/2021    TDAP status: Up to date  Flu Vaccine status: Due, Education has been provided regarding the importance of this vaccine. Advised may receive this vaccine at local pharmacy or Health Dept. Aware to provide a copy of the vaccination record if obtained from local pharmacy or Health Dept. Verbalized acceptance and understanding.  Pneumococcal vaccine status: Due, Education has been provided regarding the importance of this vaccine. Advised may receive this vaccine at local pharmacy or Health Dept. Aware to provide a copy of the vaccination record if obtained from local pharmacy or Health Dept. Verbalized acceptance and understanding.  Covid-19 vaccine status: Completed vaccines  Qualifies for Shingles Vaccine? Yes   Zostavax completed No   Shingrix Completed?: No.    Education has been provided regarding the importance of this vaccine. Patient has been advised to call insurance company to determine out of pocket expense if they have not yet received this vaccine. Advised may also receive vaccine at local pharmacy or Health Dept. Verbalized acceptance and understanding.  Screening Tests Health Maintenance  Topic Date Due   Zoster Vaccines- Shingrix (1 of 2) 03/26/2023 (Originally 02/03/1952)   COVID-19 Vaccine (4 - 2023-24 season) 03/27/2023  (Originally 04/13/2022)   Pneumonia Vaccine 37+ Years old (2 of 2 - PPSV23 or PCV20) 05/04/2023 (Originally 04/27/2020)   INFLUENZA VACCINE  03/14/2023   Medicare Annual Wellness (AWV)  02/07/2024   DTaP/Tdap/Td (3 - Td or Tdap) 09/05/2031   HPV VACCINES  Aged Out   DEXA SCAN  Discontinued    Health Maintenance  There are no preventive care reminders to display for this patient.   Colorectal cancer screening: No longer required.   Mammogram status: No longer required due to age .     Additional Screening:   Vision Screening: Recommended annual ophthalmology exams for early detection of glaucoma and other disorders of the eye. Is the patient up to date with their annual eye exam?  Yes  Who is the provider or what is the name of the office in which the patient attends annual eye exams? Dr Dione Booze  If pt is not established with a provider, would they like to be referred to a provider to establish care? No .   Dental Screening: Recommended annual dental exams for proper  oral hygiene   Community Resource Referral / Chronic Care Management: CRR required this visit?  No   CCM required this visit?  No     Plan:     I have personally reviewed and noted the following in the patient's chart:   Medical and social history Use of alcohol, tobacco or illicit drugs  Current medications and supplements including opioid prescriptions. Patient is not currently taking opioid prescriptions. Functional ability and status Nutritional status Physical activity Advanced directives List of other physicians Hospitalizations, surgeries, and ER visits in previous 12 months Vitals Screenings to include cognitive, depression, and falls Referrals and appointments  In addition, I have reviewed and discussed with patient certain preventive protocols, quality metrics, and best practice recommendations. A written personalized care plan for preventive services as well as general preventive health  recommendations were provided to patient.     Marzella Schlein, LPN   11/12/270   After Visit Summary: (MyChart) Due to this being a telephonic visit, the after visit summary with patients personalized plan was offered to patient via MyChart   Nurse Notes: none

## 2023-02-07 NOTE — Patient Instructions (Signed)
Deanna Rivera , Thank you for taking time to come for your Medicare Wellness Visit. I appreciate your ongoing commitment to your health goals. Please review the following plan we discussed and let me know if I can assist you in the future.   These are the goals we discussed:  Goals      Patient Stated     To be able to use walker to walk again      Patient Stated     Stay healthy      Prevent Falls and Broken Bones-Osteoporosis     Timeframe:  Long-Range Goal Priority:  High Start Date:  09/12/21                           Expected End Date: 03/12/22                       Follow Up Date 04/31/23    - always wear shoes or slippers with non-slip sole - keep cell phone with me always - make an emergency alert plan in case I fall - pick up clutter from the floors - remove, or use a non-slip pad, with my throw rugs    Why is this important?   When you fall, there are 3 things that control if a bone breaks or not.  These are the fall itself, how hard and the direction that you fall and how fragile your bones are.  Preventing falls is very important for you because of fragile bones.     Notes:         This is a list of the screening recommended for you and due dates:  Health Maintenance  Topic Date Due   Zoster (Shingles) Vaccine (1 of 2) 03/26/2023*   COVID-19 Vaccine (4 - 2023-24 season) 03/27/2023*   Pneumonia Vaccine (2 of 2 - PPSV23 or PCV20) 05/04/2023*   Flu Shot  03/14/2023   Medicare Annual Wellness Visit  02/07/2024   DTaP/Tdap/Td vaccine (3 - Td or Tdap) 09/05/2031   HPV Vaccine  Aged Out   DEXA scan (bone density measurement)  Discontinued  *Topic was postponed. The date shown is not the original due date.    Advanced directives: Please bring a copy of your health care power of attorney and living will to the office at your convenience.  Conditions/risks identified: stay as healthy as I can   Next appointment: Follow up in one year for your annual wellness  visit    Preventive Care 65 Years and Older, Female Preventive care refers to lifestyle choices and visits with your health care provider that can promote health and wellness. What does preventive care include? A yearly physical exam. This is also called an annual well check. Dental exams once or twice a year. Routine eye exams. Ask your health care provider how often you should have your eyes checked. Personal lifestyle choices, including: Daily care of your teeth and gums. Regular physical activity. Eating a healthy diet. Avoiding tobacco and drug use. Limiting alcohol use. Practicing safe sex. Taking low-dose aspirin every day. Taking vitamin and mineral supplements as recommended by your health care provider. What happens during an annual well check? The services and screenings done by your health care provider during your annual well check will depend on your age, overall health, lifestyle risk factors, and family history of disease. Counseling  Your health care provider may ask you questions about your: Alcohol  use. Tobacco use. Drug use. Emotional well-being. Home and relationship well-being. Sexual activity. Eating habits. History of falls. Memory and ability to understand (cognition). Work and work Astronomer. Reproductive health. Screening  You may have the following tests or measurements: Height, weight, and BMI. Blood pressure. Lipid and cholesterol levels. These may be checked every 5 years, or more frequently if you are over 24 years old. Skin check. Lung cancer screening. You may have this screening every year starting at age 16 if you have a 30-pack-year history of smoking and currently smoke or have quit within the past 15 years. Fecal occult blood test (FOBT) of the stool. You may have this test every year starting at age 42. Flexible sigmoidoscopy or colonoscopy. You may have a sigmoidoscopy every 5 years or a colonoscopy every 10 years starting at age  34. Hepatitis C blood test. Hepatitis B blood test. Sexually transmitted disease (STD) testing. Diabetes screening. This is done by checking your blood sugar (glucose) after you have not eaten for a while (fasting). You may have this done every 1-3 years. Bone density scan. This is done to screen for osteoporosis. You may have this done starting at age 74. Mammogram. This may be done every 1-2 years. Talk to your health care provider about how often you should have regular mammograms. Talk with your health care provider about your test results, treatment options, and if necessary, the need for more tests. Vaccines  Your health care provider may recommend certain vaccines, such as: Influenza vaccine. This is recommended every year. Tetanus, diphtheria, and acellular pertussis (Tdap, Td) vaccine. You may need a Td booster every 10 years. Zoster vaccine. You may need this after age 59. Pneumococcal 13-valent conjugate (PCV13) vaccine. One dose is recommended after age 62. Pneumococcal polysaccharide (PPSV23) vaccine. One dose is recommended after age 23. Talk to your health care provider about which screenings and vaccines you need and how often you need them. This information is not intended to replace advice given to you by your health care provider. Make sure you discuss any questions you have with your health care provider. Document Released: 08/26/2015 Document Revised: 04/18/2016 Document Reviewed: 05/31/2015 Elsevier Interactive Patient Education  2017 [employer].  Fall Prevention in the Home Falls can cause injuries. They can happen to people of all ages. There are many things you can do to make your home safe and to help prevent falls. What can I do on the outside of my home? Regularly fix the edges of walkways and driveways and fix any cracks. Remove anything that might make you trip as you walk through a door, such as a raised step or threshold. Trim any bushes or trees on the  path to your home. Use bright outdoor lighting. Clear any walking paths of anything that might make someone trip, such as rocks or tools. Regularly check to see if handrails are loose or broken. Make sure that both sides of any steps have handrails. Any raised decks and porches should have guardrails on the edges. Have any leaves, snow, or ice cleared regularly. Use sand or salt on walking paths during winter. Clean up any spills in your garage right away. This includes oil or grease spills. What can I do in the bathroom? Use night lights. Install grab bars by the toilet and in the tub and shower. Do not use towel bars as grab bars. Use non-skid mats or decals in the tub or shower. If you need to sit down in the shower,  use a plastic, non-slip stool. Keep the floor dry. Clean up any water that spills on the floor as soon as it happens. Remove soap buildup in the tub or shower regularly. Attach bath mats securely with double-sided non-slip rug tape. Do not have throw rugs and other things on the floor that can make you trip. What can I do in the bedroom? Use night lights. Make sure that you have a light by your bed that is easy to reach. Do not use any sheets or blankets that are too big for your bed. They should not hang down onto the floor. Have a firm chair that has side arms. You can use this for support while you get dressed. Do not have throw rugs and other things on the floor that can make you trip. What can I do in the kitchen? Clean up any spills right away. Avoid walking on wet floors. Keep items that you use a lot in easy-to-reach places. If you need to reach something above you, use a strong step stool that has a grab bar. Keep electrical cords out of the way. Do not use floor polish or wax that makes floors slippery. If you must use wax, use non-skid floor wax. Do not have throw rugs and other things on the floor that can make you trip. What can I do with my stairs? Do not  leave any items on the stairs. Make sure that there are handrails on both sides of the stairs and use them. Fix handrails that are broken or loose. Make sure that handrails are as long as the stairways. Check any carpeting to make sure that it is firmly attached to the stairs. Fix any carpet that is loose or worn. Avoid having throw rugs at the top or bottom of the stairs. If you do have throw rugs, attach them to the floor with carpet tape. Make sure that you have a light switch at the top of the stairs and the bottom of the stairs. If you do not have them, ask someone to add them for you. What else can I do to help prevent falls? Wear shoes that: Do not have high heels. Have rubber bottoms. Are comfortable and fit you well. Are closed at the toe. Do not wear sandals. If you use a stepladder: Make sure that it is fully opened. Do not climb a closed stepladder. Make sure that both sides of the stepladder are locked into place. Ask someone to hold it for you, if possible. Clearly mark and make sure that you can see: Any grab bars or handrails. First and last steps. Where the edge of each step is. Use tools that help you move around (mobility aids) if they are needed. These include: Canes. Walkers. Scooters. Crutches. Turn on the lights when you go into a dark area. Replace any light bulbs as soon as they burn out. Set up your furniture so you have a clear path. Avoid moving your furniture around. If any of your floors are uneven, fix them. If there are any pets around you, be aware of where they are. Review your medicines with your doctor. Some medicines can make you feel dizzy. This can increase your chance of falling. Ask your doctor what other things that you can do to help prevent falls. This information is not intended to replace advice given to you by your health care provider. Make sure you discuss any questions you have with your health care provider. Document Released:  05/26/2009 Document  Revised: 01/05/2016 Document Reviewed: 09/03/2014 Elsevier Interactive Patient Education  2017 [employer].

## 2023-02-08 NOTE — Progress Notes (Signed)
Office Visit Note  Patient: Deanna Rivera             Date of Birth: 1933/08/02           MRN: [id number]             PCP: Willow Ora, MD Referring: Antony Madura, MD Visit Date: 02/21/2023 Occupation: @GUAROCC @  Subjective:  Generalized muscle weakness  History of Present Illness: Deanna Rivera is a 89 y.o. female patient was seen in consultation per request of Dr. Loleta Chance.  According to the patient she started having weakness in all of her muscles in 1970s.  Initially she was told that she had lupus but gradually her symptoms improved except for muscle weakness.  She recalls seeing a rheumatologist in 2009 in [place] who did extensive workup including EMG and nerve conduction velocities followed by muscle biopsy.  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  She was placed on prednisone.  Patient does not recall the dose that she was on.  She started feeling better on prednisone and the prednisone was tapered down to 1 mg p.o. daily.  She was advised never to come off prednisone.  She has taken prednisone 1 mg p.o. daily for many years.  She states over the last few years she has been progressively getting weaker.  She had frequent falls and now she mostly stays in the wheelchair.  She is unable to use her hands much.  She states a year ago she started having thoracic pain and was told that she had T8 and T9 fracture.  She never had any procedure done and she continues to have some thoracic discomfort.  Patient states she has tried physical therapy without much improvement.  She denies any shortness of breath but she has difficulty swallowing liquids and solids over the last few years.  She has been experiencing discomfort in her shoulders and difficulty raising her arms.  She states she was evaluated by the orthopedic surgeon and was told that she has osteoarthritis and rotator cuff issues in her bilateral shoulders.  She has bilateral forward  shoulders.  She also has peripheral neuropathy in her feet.  She gives history of fatigue and infrequent oral ulcers.  There is no history of sicca symptoms, malar rash, photosensitivity, Raynaud's or lymphadenopathy.  Family history is positive for scleroderma and her daughter who died at the age of 33.    Activities of Daily Living:  Patient reports morning stiffness for several hours.   Patient Reports nocturnal pain.  Difficulty dressing/grooming: Reports Difficulty climbing stairs: Reports Difficulty getting out of chair: Reports Difficulty using hands for taps, buttons, cutlery, and/or writing: Reports  Review of Systems  Constitutional:  Positive for fatigue.  HENT:  Positive for mouth sores. Negative for mouth dryness.   Eyes:  Negative for dryness.  Respiratory:  Negative for shortness of breath.   Cardiovascular:  Positive for swelling in legs/feet. Negative for chest pain and palpitations.  Gastrointestinal:  Negative for blood in stool, constipation and diarrhea.  Endocrine: Positive for increased urination.  Genitourinary:  Positive for involuntary urination.  Musculoskeletal:  Positive for joint pain, gait problem, joint pain, myalgias, muscle weakness, morning stiffness, muscle tenderness and myalgias. Negative for joint swelling.  Skin:  Negative for color change, rash, hair loss and sensitivity to sunlight.  Allergic/Immunologic: Negative for susceptible to infections.  Neurological:  Positive for numbness. Negative for dizziness and headaches.  Hematological:  Negative for swollen  glands.  Psychiatric/Behavioral:  Positive for sleep disturbance. Negative for depressed mood. The patient is not nervous/anxious.     PMFS History:  Patient Active Problem List   Diagnosis Date Noted   Bilateral lower extremity edema 01/15/2023   Pulmonary hypertension, unspecified (HCC) 01/15/2023   Valvular heart disease 01/15/2023   Protein-calorie malnutrition, severe 11/06/2022    Physical deconditioning 11/04/2022   Atelectasis, bilateral 11/04/2022   Hypokalemia 11/04/2022   Hyponatremia 11/04/2022   Hypothyroidism 11/04/2022   Degenerative lumbar spinal stenosis 05/30/2022   Encounter to establish care 01/14/2022   Sensorineural hearing loss (SNHL) of both ears 01/14/2022   Bilateral impacted cerumen 01/14/2022   Hashimoto's thyroiditis 06/15/2021   History of vitamin D deficiency 06/15/2021   Frequent falls 05/17/2021   Age-related osteoporosis with current pathological fracture 02/06/2021   Compression fracture of body of thoracic vertebra (HCC) 02/06/2021   Primary osteoarthritis of right knee 10/23/2019   Neuralgia, postherpetic 07/02/2019   OAB (overactive bladder) 07/02/2019   SSS (sick sinus syndrome) (HCC) 02/11/2019   Constipation 01/18/2019   Status post placement of cardiac pacemaker 07/08/2018   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   Shellfish allergy 11/22/2015   Nonrheumatic mitral valve regurgitation 09/16/2015   Nontoxic multinodular goiter 09/08/2015   Current use of long term anticoagulation 05/16/2015   Recurrent UTI 03/14/2015   (HFpEF) heart failure with preserved ejection fraction (HCC) 09/13/2014   Pacemaker 11/09/2011   Impaired fasting glucose 05/23/2009   Synovial cyst of popliteal space 11/22/2008   Long term current use of systemic steroids 03/30/2008   Polymyalgia rheumatica (HCC) 03/30/2008   Benign essential hypertension 05/05/2007   Persistent atrial fibrillation (HCC) 11/06/2006   Pure hypercholesterolemia 11/06/2006   Hypothyroidism due to Hashimoto's thyroiditis, Rx Levothyroxine 06/25/2005    Past Medical History:  Diagnosis Date   Cataract    Hypertension    Moderate aortic stenosis    Moderate mitral regurgitation    Persistent atrial fibrillation (HCC)    PMR (polymyalgia rheumatica) (HCC)    Second degree AV block    Sick sinus syndrome (HCC)    Thyroid disease     Family History  Problem Relation Age  of Onset   Early death Mother 55   Rheumatic fever Mother    Heart disease Mother        valve replacement   Cirrhosis Father 61   Hypertension Father    Hypertension Brother    Lung cancer Brother    Hypertension Brother    Other Brother        smoker   Scleroderma Daughter    Hypertension Son    Hypertension Son    Past Surgical History:  Procedure Laterality Date   ABDOMINAL HYSTERECTOMY     APPENDECTOMY     CESAREAN SECTION     x 3   CHOLECYSTECTOMY     PACEMAKER GENERATOR CHANGE  11/20/2018   [employer] Accolade MRI PPM implanted in [place]   PACEMAKER IMPLANT     PILONIDAL CYST EXCISION     Social History   Social History Narrative   ** Merged History Encounter **       Recently moved from [place]. In independent living with her husband. Daughter died from scleroderma complications. Hx of falls, but last was > 1.5 years ago. 01/18/2019    Immunization History  Administered Date(s) Administered   Fluad Quad(high Dose 65+) 05/13/2020   Moderna Sars-Covid-2 Vaccination 08/12/2019, 09/09/2019, 06/14/2020   Pneumococcal Conjugate-13 04/28/2019   Tdap 05/12/2021,  09/04/2021     Objective: Vital Signs: BP 127/78 (BP Location: Right Arm, Patient Position: Sitting, Cuff Size: Normal)   Pulse 76   Resp 13   Ht 5\' 4"  (1.626 m) Comment: per patient  Wt 120 lb (54.4 kg) Comment: per patient  BMI 20.60 kg/m    Physical Exam Vitals and nursing note reviewed.  Constitutional:      Appearance: She is well-developed.  HENT:     Head: Normocephalic and atraumatic.  Eyes:     Conjunctiva/sclera: Conjunctivae normal.  Cardiovascular:     Rate and Rhythm: Normal rate and regular rhythm.     Heart sounds: Normal heart sounds.  Pulmonary:     Effort: Pulmonary effort is normal.     Breath sounds: Normal breath sounds.  Abdominal:     General: Bowel sounds are normal.     Palpations: Abdomen is soft.  Musculoskeletal:     Cervical back: Normal range of  motion.     Right lower leg: Edema present.     Left lower leg: Edema present.  Lymphadenopathy:     Cervical: No cervical adenopathy.  Skin:    General: Skin is warm and dry.     Capillary Refill: Capillary refill takes less than 2 seconds.     Comments: No nailbed capillary changes, sclerodactyly was noted.  Neurological:     Mental Status: She is alert and oriented to person, place, and time.  Psychiatric:        Behavior: Behavior normal.      Musculoskeletal Exam: Patient had limited lateral rotation of the cervical spine without discomfort.  Thoracic spine and lumbar spine were difficult to assess in the seated position.  She had no point tenderness.  Shoulder joint abduction was limited to 30 degrees bilaterally.  Elbow joints and wrist joints were in good range of motion.  She had incomplete fist formation bilaterally.  No sclerodactyly or nailbed capillary changes were noted.  Hip joints could not be assessed in the seated position.  Knee joints were in good range of motion without any warmth swelling or effusion.  There was no tenderness over ankles or MTPs.  CDAI Exam: CDAI Score: -- Patient Global: --; Provider Global: -- Swollen: --; Tender: -- Joint Exam 02/21/2023   No joint exam has been documented for this visit   There is currently no information documented on the homunculus. Go to the Rheumatology activity and complete the homunculus joint exam.  Investigation: No additional findings.  Imaging: CUP PACEART REMOTE DEVICE CHECK  Result Date: 02/06/2023 Scheduled remote reviewed. Normal device function.  Known permanent AF, programmed VVIR. There was one false NSVT detected that was AF with rapid ventricular rates. Next remote 91 days. Hassell Halim, RN, CCDS, [employer]   Recent Labs: Lab Results  Component Value Date   WBC 6.5 01/22/2023   HGB 12.8 01/22/2023   PLT 239 01/22/2023   NA 134 (L) 01/30/2023   K 4.3 01/30/2023   CL 99 01/30/2023    CO2 28 01/30/2023   GLUCOSE 91 01/30/2023   BUN 13 01/30/2023   CREATININE <0.30 (L) 01/30/2023   BILITOT 0.6 12/27/2022   ALKPHOS 53 12/27/2022   AST 29 12/27/2022   ALT 21 12/27/2022   PROT 7.3 12/27/2022   ALBUMIN 3.4 (L) 12/27/2022   CALCIUM 9.8 01/30/2023   GFRAA 116 07/28/2020   08/23/2022 SPEP showed 2 peaks of beta and gamma region which may represent monoclonal protein.  Kappa free  chain elevated, lambda free chain elevated November 07, 2022 phosphorus normal, magnesium normal November 04, 2022 CK 61, sed rate 58, CRP 6.5 November 05, 2022 B12 normal  Speciality Comments: No specialty comments available.  Procedures:  No procedures performed Allergies: Shellfish-derived products, Epinephrine, Monosodium glutamate, Monosodium glutamate, Ciprofloxacin, Dronedarone, Erythromycin, Sulfa antibiotics, Sulfamethoxazole-trimethoprim, and Tetracyclines & related   Assessment / Plan:     Visit Diagnoses: Positive ANA (antinuclear antibody) -patient has positive ANA but no clinical features of lupus.  There is no history of sicca symptoms, malar rash, Raynaud's, photosensitivity, inflammatory arthritis or lymphadenopathy.  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  She was treated with the prednisone uncertain dose and then tapered.  She states on prednisone 1 mg p.o. daily for many years.  Patient states she felt better on prednisone.  She has generalized muscular weakness and deconditioning.  She had both proximal and distal muscle weakness.  Hyporeflexia was noted.  She stayed in seated position.  CK obtained recently was within normal limits.  Patient wanted to have a myositis panel done.  I discussed about trials on inclusion body myositis at [facility].  Patient is not interested in any trials at this  time.  Plan: CK, Sedimentation rate, Aldolase, 3-Hydroxy-3-Methylglutaryl-Coenzyme A Reductase (HMGCR) AB (IgG), Myositis Specific II Antibodies Panel, ANA  Long term current use of systemic steroids -patient has been on prednisone for at least 15 years.  She has been on low-dose prednisone for many years.  Prednisone 1 mg po qd -patient states she had recent hypokalemia and would like to have repeat CMP.  Plan: COMPLETE METABOLIC PANEL WITH GFR  Chronic pain of both shoulders-patient was able limited by orthopedics.  She has limited range of motion of her shoulders and discomfort.  She was advised that she had frozen shoulder and rotator cuff tendinopathy.  Primary osteoarthritis of right knee-patient states she has end-stage osteoarthritis in her right knee joint.  She has discomfort with range of motion of her right knee without any warmth swelling or effusion.  Age-related osteoporosis with current pathological fracture with routine healing, subsequent encounter -I do not have the DEXA result available.  I offered DEXA scan which patient declined.  Patient states she will not be interested in the treatment of osteoporosis.  Plan: VITAMIN D 25 Hydroxy (Vit-D Deficiency, Fractures)  Compression fracture of body of thoracic vertebra (HCC) - T8,T9 according to the patient  Vitamin D deficiency -she has known history of vitamin D deficiency.  Vitamin D was 25.86 on June 15, 2021.  I will check vitamin D level.  She may benefit from getting vitamin D in the normal range.  Plan: VITAMIN D 25 Hydroxy (Vit-D Deficiency, Fractures)  Other medical problems listed as follows:  Valvular heart disease  SSS (sick sinus syndrome) (HCC)  Persistent atrial fibrillation (HCC)  Status post placement of cardiac pacemaker  Current use of long term anticoagulation - on Eliquis  Nonrheumatic mitral valve regurgitation  Benign essential hypertension  Pure hypercholesterolemia  Acute on chronic heart  failure with preserved ejection fraction (HCC)  Nontoxic multinodular goiter  Hypothyroidism due to Hashimoto's thyroiditis, Rx Levothyroxine  Sensorineural hearing loss (SNHL) of both ears  OAB (overactive bladder)  Protein-calorie malnutrition, severe  Frequent falls  Family history of scleroderma-daughter  Orders: Orders Placed This Encounter  Procedures   CK   COMPLETE METABOLIC PANEL WITH GFR   Sedimentation rate   Aldolase   3-Hydroxy-3-Methylglutaryl-Coenzyme A Reductase (HMGCR) AB (IgG)   Myositis Specific II Antibodies Panel   ANA   VITAMIN D 25 Hydroxy (Vit-D Deficiency, Fractures)   No orders of the defined types were placed in this encounter.    Follow-Up Instructions: Return if symptoms worsen or fail to improve, for myositis, Osteoarthritis, Osteoporosis.  Patient would prefer for [place] to contact her with the lab results.  She will make a decision to come for a follow-up after the lab results are available.   Pollyann Savoy, MD  Note - This record has been created using Animal nutritionist.  Chart creation errors have been sought, but may not always  have been located. Such creation errors do not reflect on  the standard of medical care.

## 2023-02-13 ENCOUNTER — Ambulatory Visit (INDEPENDENT_AMBULATORY_CARE_PROVIDER_SITE_OTHER): Payer: Medicare Other | Admitting: Neurology

## 2023-02-13 ENCOUNTER — Encounter: Payer: Self-pay | Admitting: Neurology

## 2023-02-13 VITALS — BP 127/72 | HR 63 | Ht 64.0 in | Wt 119.0 lb

## 2023-02-13 DIAGNOSIS — R531 Weakness: Secondary | ICD-10-CM | POA: Diagnosis not present

## 2023-02-13 DIAGNOSIS — G629 Polyneuropathy, unspecified: Secondary | ICD-10-CM

## 2023-02-13 DIAGNOSIS — R209 Unspecified disturbances of skin sensation: Secondary | ICD-10-CM

## 2023-02-13 DIAGNOSIS — R768 Other specified abnormal immunological findings in serum: Secondary | ICD-10-CM | POA: Diagnosis not present

## 2023-02-13 DIAGNOSIS — G7241 Inclusion body myositis [IBM]: Secondary | ICD-10-CM

## 2023-02-13 DIAGNOSIS — D472 Monoclonal gammopathy: Secondary | ICD-10-CM

## 2023-02-13 DIAGNOSIS — R262 Difficulty in walking, not elsewhere classified: Secondary | ICD-10-CM

## 2023-02-13 DIAGNOSIS — R5381 Other malaise: Secondary | ICD-10-CM

## 2023-02-13 NOTE — Patient Instructions (Signed)
I want you to continue PT and OT.  I am ordering speech therapy.  I will also look into the power chair for you.   We discussed a feeding tube due to poor swallowing, but you were not interested at this time.  I want to see you back in clinic in about 3 months.  The physicians and staff at [facility] are committed to providing excellent care. You may receive a survey requesting feedback about your experience at our office. We strive to receive "very good" responses to the survey questions. If you feel that your experience would prevent you from giving the office a "very good " response, please contact our office to try to remedy the situation. We may be reached at [phone]. Thank you for taking the time out of your busy day to complete the survey.  Jacquelyne Balint, MD [facility]

## 2023-02-20 ENCOUNTER — Other Ambulatory Visit: Payer: Self-pay | Admitting: Physician Assistant

## 2023-02-21 ENCOUNTER — Encounter: Payer: Self-pay | Admitting: Rheumatology

## 2023-02-21 ENCOUNTER — Ambulatory Visit: Payer: Medicare Other | Attending: Rheumatology | Admitting: Rheumatology

## 2023-02-21 VITALS — BP 127/78 | HR 76 | Resp 13 | Ht 64.0 in | Wt 120.0 lb

## 2023-02-21 DIAGNOSIS — I38 Endocarditis, valve unspecified: Secondary | ICD-10-CM | POA: Insufficient documentation

## 2023-02-21 DIAGNOSIS — E43 Unspecified severe protein-calorie malnutrition: Secondary | ICD-10-CM | POA: Diagnosis present

## 2023-02-21 DIAGNOSIS — Z8639 Personal history of other endocrine, nutritional and metabolic disease: Secondary | ICD-10-CM

## 2023-02-21 DIAGNOSIS — H903 Sensorineural hearing loss, bilateral: Secondary | ICD-10-CM | POA: Insufficient documentation

## 2023-02-21 DIAGNOSIS — N3281 Overactive bladder: Secondary | ICD-10-CM | POA: Insufficient documentation

## 2023-02-21 DIAGNOSIS — G8929 Other chronic pain: Secondary | ICD-10-CM | POA: Diagnosis present

## 2023-02-21 DIAGNOSIS — E7800 Pure hypercholesterolemia, unspecified: Secondary | ICD-10-CM | POA: Diagnosis present

## 2023-02-21 DIAGNOSIS — M25511 Pain in right shoulder: Secondary | ICD-10-CM | POA: Insufficient documentation

## 2023-02-21 DIAGNOSIS — E063 Autoimmune thyroiditis: Secondary | ICD-10-CM | POA: Diagnosis present

## 2023-02-21 DIAGNOSIS — G7241 Inclusion body myositis [IBM]: Secondary | ICD-10-CM | POA: Diagnosis not present

## 2023-02-21 DIAGNOSIS — Z8269 Family history of other diseases of the musculoskeletal system and connective tissue: Secondary | ICD-10-CM | POA: Diagnosis present

## 2023-02-21 DIAGNOSIS — M1711 Unilateral primary osteoarthritis, right knee: Secondary | ICD-10-CM | POA: Insufficient documentation

## 2023-02-21 DIAGNOSIS — E042 Nontoxic multinodular goiter: Secondary | ICD-10-CM | POA: Insufficient documentation

## 2023-02-21 DIAGNOSIS — Z7952 Long term (current) use of systemic steroids: Secondary | ICD-10-CM | POA: Insufficient documentation

## 2023-02-21 DIAGNOSIS — Z950 Presence of cardiac pacemaker: Secondary | ICD-10-CM | POA: Insufficient documentation

## 2023-02-21 DIAGNOSIS — I340 Nonrheumatic mitral (valve) insufficiency: Secondary | ICD-10-CM | POA: Insufficient documentation

## 2023-02-21 DIAGNOSIS — Z7901 Long term (current) use of anticoagulants: Secondary | ICD-10-CM | POA: Insufficient documentation

## 2023-02-21 DIAGNOSIS — I10 Essential (primary) hypertension: Secondary | ICD-10-CM | POA: Diagnosis present

## 2023-02-21 DIAGNOSIS — R768 Other specified abnormal immunological findings in serum: Secondary | ICD-10-CM | POA: Diagnosis not present

## 2023-02-21 DIAGNOSIS — E038 Other specified hypothyroidism: Secondary | ICD-10-CM | POA: Insufficient documentation

## 2023-02-21 DIAGNOSIS — I5033 Acute on chronic diastolic (congestive) heart failure: Secondary | ICD-10-CM | POA: Insufficient documentation

## 2023-02-21 DIAGNOSIS — M8000XD Age-related osteoporosis with current pathological fracture, unspecified site, subsequent encounter for fracture with routine healing: Secondary | ICD-10-CM | POA: Diagnosis present

## 2023-02-21 DIAGNOSIS — R296 Repeated falls: Secondary | ICD-10-CM | POA: Diagnosis present

## 2023-02-21 DIAGNOSIS — I4819 Other persistent atrial fibrillation: Secondary | ICD-10-CM | POA: Diagnosis present

## 2023-02-21 DIAGNOSIS — I495 Sick sinus syndrome: Secondary | ICD-10-CM | POA: Insufficient documentation

## 2023-02-21 DIAGNOSIS — E559 Vitamin D deficiency, unspecified: Secondary | ICD-10-CM | POA: Insufficient documentation

## 2023-02-21 DIAGNOSIS — M25512 Pain in left shoulder: Secondary | ICD-10-CM | POA: Diagnosis present

## 2023-02-21 DIAGNOSIS — J9811 Atelectasis: Secondary | ICD-10-CM

## 2023-02-21 DIAGNOSIS — S22000A Wedge compression fracture of unspecified thoracic vertebra, initial encounter for closed fracture: Secondary | ICD-10-CM | POA: Diagnosis present

## 2023-02-26 ENCOUNTER — Telehealth: Payer: Self-pay | Admitting: *Deleted

## 2023-02-26 DIAGNOSIS — R768 Other specified abnormal immunological findings in serum: Secondary | ICD-10-CM

## 2023-02-26 NOTE — Telephone Encounter (Signed)
-----   Message from [facility] sent at 02/26/2023  8:27 AM EDT ----- Please add ENA, C3-C4 if possible.  If ENA cannot be added please have patient come in for the labs which should include ENA, and C3-C4.

## 2023-02-26 NOTE — Progress Notes (Signed)
Please add ENA, C3-C4 if possible.  If ENA cannot be added please have patient come in for the labs which should include ENA, and C3-C4.

## 2023-02-27 ENCOUNTER — Other Ambulatory Visit: Payer: Self-pay | Admitting: *Deleted

## 2023-02-27 DIAGNOSIS — R768 Other specified abnormal immunological findings in serum: Secondary | ICD-10-CM

## 2023-02-27 DIAGNOSIS — G7241 Inclusion body myositis [IBM]: Secondary | ICD-10-CM

## 2023-02-27 NOTE — Addendum Note (Signed)
Addended by: Henriette Combs on: 02/27/2023 03:12 PM   Modules accepted: Orders

## 2023-02-27 NOTE — Progress Notes (Signed)
 Remote pacemaker transmission.   

## 2023-02-28 ENCOUNTER — Other Ambulatory Visit: Payer: Self-pay | Admitting: Rheumatology

## 2023-02-28 ENCOUNTER — Inpatient Hospital Stay: Payer: Medicare Other | Attending: Oncology

## 2023-02-28 ENCOUNTER — Encounter: Payer: Medicare Other | Admitting: Internal Medicine

## 2023-02-28 LAB — MYOSITIS SPECIFIC II ANTIBODIES PANEL
EJ AB: <11 SI (ref <11)
JO-1 AB: <11 SI (ref <11)
MDA-5 AB: <11 SI (ref <11)
MI-2 ALPHA AB: <11 SI (ref <11)
MI-2 BETA AB: <11 SI (ref <11)
NXP-2 AB: <11 SI (ref <11)
OJ AB: <11 SI (ref <11)
PL-12 AB: <11 SI (ref <11)
PL-7 AB: <11 SI (ref <11)
SRP-AB: <11 SI (ref <11)
TIF-1y AB: <11 SI (ref <11)

## 2023-02-28 LAB — COMPLETE METABOLIC PANEL WITH GFR
AG Ratio: 0.9 (calc) — ABNORMAL LOW (ref 1.0–2.5)
ALT: 24 U/L (ref 6–29)
AST: 33 U/L (ref 10–35)
Albumin: 3.6 g/dL (ref 3.6–5.1)
Alkaline phosphatase (APISO): 58 U/L (ref 37–153)
BUN/Creatinine Ratio: 56 (calc) — ABNORMAL HIGH (ref 6–22)
BUN: 18 mg/dL (ref 7–25)
CO2: 28 mmol/L (ref 20–32)
Calcium: 10 mg/dL (ref 8.6–10.4)
Chloride: 100 mmol/L (ref 98–110)
Creat: 0.32 mg/dL — ABNORMAL LOW (ref 0.60–0.95)
Globulin: 3.9 g/dL (calc) — ABNORMAL HIGH (ref 1.9–3.7)
Glucose, Bld: 91 mg/dL (ref 65–99)
Potassium: 4.2 mmol/L (ref 3.5–5.3)
Sodium: 136 mmol/L (ref 135–146)
Total Bilirubin: 0.8 mg/dL (ref 0.2–1.2)
Total Protein: 7.5 g/dL (ref 6.1–8.1)
eGFR: 100 mL/min/{1.73_m2} (ref >=60)

## 2023-02-28 LAB — ANTI-NUCLEAR AB-TITER (ANA TITER): ANA Titer 1: 1:320 {titer} — ABNORMAL HIGH

## 2023-02-28 LAB — CK: Total CK: 254 U/L — ABNORMAL HIGH (ref 29–143)

## 2023-02-28 LAB — SEDIMENTATION RATE: Sed Rate: 79 mm/h — ABNORMAL HIGH (ref 0–30)

## 2023-02-28 LAB — HMGCR AB (IGG): HMGCR AB (IGG): <2 CU (ref <20)

## 2023-02-28 LAB — VITAMIN D 25 HYDROXY (VIT D DEFICIENCY, FRACTURES): Vit D, 25-Hydroxy: 42 ng/mL (ref 30–100)

## 2023-02-28 LAB — ALDOLASE: Aldolase: 7.7 U/L (ref <=8.1)

## 2023-02-28 LAB — ANA: Anti Nuclear Antibody (ANA): POSITIVE — AB

## 2023-03-01 NOTE — Progress Notes (Signed)
Please have patient come in for ENA, C3 and C4.

## 2023-03-02 LAB — C3 AND C4
Complement C3, Serum: 82 mg/dL (ref 82–167)
Complement C4, Serum: 14 mg/dL (ref 12–38)

## 2023-03-02 LAB — ANTI-DNA ANTIBODY, DOUBLE-STRANDED: dsDNA Ab: 4 IU/mL (ref 0–9)

## 2023-03-02 LAB — ANTI-SCLERODERMA ANTIBODY: Scleroderma (Scl-70) (ENA) Antibody, IgG: <0.2 AI (ref 0.0–0.9)

## 2023-03-02 LAB — ANTI-SMITH ANTIBODY: ENA SM Ab Ser-aCnc: <0.2 AI (ref 0.0–0.9)

## 2023-03-02 LAB — SJOGRENS SYNDROME-A EXTRACTABLE NUCLEAR ANTIBODY: ENA SSA (RO) Ab: <0.2 AI (ref 0.0–0.9)

## 2023-03-02 LAB — RNP ANTIBODIES: ENA RNP Ab: 0.2 AI (ref 0.0–0.9)

## 2023-03-02 LAB — SJOGRENS SYNDROME-B EXTRACTABLE NUCLEAR ANTIBODY: ENA SSB (LA) Ab: <0.2 AI (ref 0.0–0.9)

## 2023-03-02 NOTE — Progress Notes (Signed)
Complements are normal, ENA panel (double-stranded DNA, Smith, SCL 70, RNP, SSA, SSB) is negative.  Patient was planning to cancel the follow-up appointment if her labs were normal.  Please notify patient.

## 2023-03-08 NOTE — Progress Notes (Addendum)
Virtual Visit via Telephone Note  I connected with Deanna Rivera on 03/08/23 at 11:20 AM EDT by telephone and verified that I am speaking with the correct person using two identifiers.  Location: Patient: At home Provider: In office   I discussed the limitations, risks, security and privacy concerns of performing an evaluation and management service by telephone and the availability of in person appointments. I also discussed with the patient that there may be a patient responsible charge related to this service. The patient expressed understanding and agreed to proceed.   History of Present Illness: Positive ANA, inclusion body myositis, osteoporosis and osteoarthritis.  According the patient she continues to take prednisone 5 mg p.o. daily for several years.  She has generalized muscle weakness and deconditioning but no increased muscle weakness than her last visit.  She denies any history of oral ulcers, nasal ulcers, malar rash, photosensitivity, lymphadenopathy or inflammatory arthritis.  She continues to have some stiffness in her shoulders and right knee.  Patient reports morning stiffness for 15 minutes.   Patient reports nocturnal pain.  Difficulty dressing/grooming: Denies Difficulty climbing stairs: Denies Difficulty getting out of chair: Denies Difficulty using hands for taps, buttons, cutlery, and/or writing: Reports    Review of Systems  Constitutional:  Positive for malaise/fatigue.  Eyes:  Negative for redness.  Respiratory:  Negative for shortness of breath.   Cardiovascular:  Negative for chest pain and palpitations.  Gastrointestinal:  Negative for blood in stool, constipation and diarrhea.  Genitourinary:  Positive for frequency.  Musculoskeletal:  Positive for joint pain and myalgias.  Skin:  Negative for rash.  Neurological:  Negative for dizziness and headaches.  Psychiatric/Behavioral:  Negative for depression. The patient is not nervous/anxious.      Observations/Objective:  Physical Exam HENT:     Head: Normocephalic.  Neurological:     Mental Status: She is alert and oriented to person, place, and time.  Psychiatric:        Mood and Affect: Mood normal.      February 21, 2023 myositis panel negative, HMG CR antibody<2, aldolase 7.7, ANA 1: 320 NH, ENA double-stranded DNA, Smith, SCL 70, RNP, SSA, SSB) negative, C3-C4 normal, ESR 79, vitamin D 42  08/23/2022 SPEP showed 2 peaks of beta and gamma region which may represent monoclonal protein.  Kappa free chain elevated, lambda free chain elevated November 07, 2022 phosphorus normal, magnesium normal November 04, 2022 CK 61, sed rate 58, CRP 6.5 November 05, 2022 B12 normal  Assessment and Plan: isit Diagnoses: Positive ANA (antinuclear antibody) -Positive ANA, ENA negative, C3-C4 normal.  Lab results were reviewed with the patient.  No history of oral ulcers, nasal ulcers, sicca symptoms, malar rash, Raynaud's, photosensitivity, inflammatory arthritis or lymphadenopathy.   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  Generalized deconditioning with proximal and distal muscle weakness was noted at the last visit.  She had hyperreflexia at the last office visit.  CK normal, aldolase normal, myositis panel negative, HMG CR antibody negative.  Advised patient to contact me if she develops any new symptoms.   Long term current use of systemic steroids -On prednisone for the last 15 years. Currently on prednisone 5 mg p.o. daily.  She will continue to take prednisone as prescribed.   Chronic pain of both shoulders-Followed by orthopedics for frozen shoulder send rotator cuff tendinopathy.    Primary osteoarthritis of right knee-patient states she has end-stage osteoarthritis  in her right knee joint.  History of chronic pain.   Age-related osteoporosis with current pathological fracture with routine healing,  subsequent encounter -patient declined DEXA scan at the last visit.  She does not want to take any treatment for osteoporosis.  Use of calcium rich diet, vitamin D was advised.   Compression fracture of body of thoracic vertebra (HCC) - T8,T9 according to the patient   Vitamin D deficiency -she has known history of vitamin D deficiency.  Vitamin D was 25.86 on June 15, 2021.  Vitamin D 42 on February 21, 2023.   Other medical problems listed as follows:   Valvular heart disease   SSS (sick sinus syndrome) (HCC)   Persistent atrial fibrillation (HCC)   Status post placement of cardiac pacemaker   Current use of long term anticoagulation - on Eliquis   Nonrheumatic mitral valve regurgitation   Benign essential hypertension   Pure hypercholesterolemia   Acute on chronic heart failure with preserved ejection fraction (HCC)   Nontoxic multinodular goiter   Hypothyroidism due to Hashimoto's thyroiditis, Rx Levothyroxine   Sensorineural hearing loss (SNHL) of both ears   OAB (overactive bladder)   Protein-calorie malnutrition, severe   Frequent falls   Family history of scleroderma-daughter  Follow Up Instructions:    I discussed the assessment and treatment plan with the patient. The patient was provided an opportunity to ask questions and all were answered. The patient agreed with the plan and demonstrated an understanding of the instructions.   The patient was advised to call back or seek an in-person evaluation if the symptoms worsen or if the condition fails to improve as anticipated.  Time spent with patient was 15 minutes.  More than 50% time was spent in counseling and coordination of care.  Pollyann Savoy, MD

## 2023-03-19 ENCOUNTER — Encounter: Payer: Self-pay | Admitting: Family Medicine

## 2023-03-19 ENCOUNTER — Ambulatory Visit (INDEPENDENT_AMBULATORY_CARE_PROVIDER_SITE_OTHER): Payer: Medicare Other | Admitting: Family Medicine

## 2023-03-19 VITALS — BP 114/80 | HR 59 | Temp 97.6°F | Ht 64.0 in | Wt 118.0 lb

## 2023-03-19 DIAGNOSIS — Z7901 Long term (current) use of anticoagulants: Secondary | ICD-10-CM | POA: Diagnosis not present

## 2023-03-19 DIAGNOSIS — I5033 Acute on chronic diastolic (congestive) heart failure: Secondary | ICD-10-CM

## 2023-03-19 DIAGNOSIS — I10 Essential (primary) hypertension: Secondary | ICD-10-CM | POA: Diagnosis not present

## 2023-03-19 DIAGNOSIS — I4819 Other persistent atrial fibrillation: Secondary | ICD-10-CM

## 2023-03-19 DIAGNOSIS — R262 Difficulty in walking, not elsewhere classified: Secondary | ICD-10-CM

## 2023-03-19 DIAGNOSIS — G7241 Inclusion body myositis [IBM]: Secondary | ICD-10-CM

## 2023-03-19 DIAGNOSIS — I272 Pulmonary hypertension, unspecified: Secondary | ICD-10-CM

## 2023-03-19 DIAGNOSIS — M353 Polymyalgia rheumatica: Secondary | ICD-10-CM

## 2023-03-19 DIAGNOSIS — R1313 Dysphagia, pharyngeal phase: Secondary | ICD-10-CM

## 2023-03-19 DIAGNOSIS — E43 Unspecified severe protein-calorie malnutrition: Secondary | ICD-10-CM

## 2023-03-19 NOTE — Patient Instructions (Signed)
Please return in 3 months for recheck  If you have any questions or concerns, please don't hesitate to send me a message via MyChart or call the office at [phone]. Thank you for visiting with [place] today! It's our pleasure caring for you.   Advance Directive  Advance directives are legal documents that allow you to make decisions about your health care and medical treatment in case you become unable to communicate for yourself. Advance directives let your wishes be known to family, friends, and health care providers. Discussing and writing advance directives should happen over time rather than all at once. Advance directives can be changed and updated at any time. There are different types of advance directives, such as: Medical power of attorney. Living will. Do not resuscitate (DNR) order or do not attempt resuscitation (DNAR) order. Health care proxy and medical power of attorney A health care proxy is also called a health care agent. This person is appointed to make medical decisions for you when you are unable to make decisions for yourself. Generally, people ask a trusted friend or family member to act as their proxy and represent their preferences. Make sure you have an agreement with your trusted person to act as your proxy. A proxy may have to make a medical decision on your behalf if your wishes are not known. A medical power of attorney, also called a durable power of attorney for health care, is a legal document that names your health care proxy. Depending on the laws in your state, the document may need to be: Signed. Notarized. Dated. Copied. Witnessed. Incorporated into your medical record. You may also want to appoint a trusted person to manage your money in the event you are unable to do so. This is called a durable power of attorney for finances. It is a separate legal document from the durable power of attorney for health care. You may choose your health care proxy or someone  different to act as your agent in money matters. If you do not appoint a proxy, or there is a concern that the proxy is not acting in your best interest, a court may appoint a guardian to act on your behalf. Living will A living will is a set of instructions that state your wishes about medical care when you cannot express them yourself. Health care providers should keep a copy of your living will in your medical record. You may want to give a copy to family members or friends. To alert caregivers in case of an emergency, you can place a card in your wallet to let them know that you have a living will and where they can find it. A living will is used if you become: Terminally ill. Disabled. Unable to communicate or make decisions. The following decisions should be included in your living will: To use or not to use life support equipment, such as dialysis machines and breathing machines (ventilators). Whether you want a DNR or DNAR order. This tells health care providers not to use cardiopulmonary resuscitation (CPR) if breathing or heartbeat stops. To use or not to use tube feeding. To be given or not to be given food and fluids. Whether you want comfort (palliative) care when the goal becomes comfort rather than a cure. Whether you want to donate your organs and tissues. A living will does not give instructions for distributing your money and property if you should pass away. DNR or DNAR A DNR or DNAR order is a request not  to have CPR in the event that your heart stops beating or you stop breathing. If a DNR or DNAR order has not been made and shared, a health care provider will try to help any patient whose heart has stopped or who has stopped breathing. If you plan to have surgery, talk with your health care provider about how your DNR or DNAR order will be followed if problems occur. What if I do not have an advance directive? Some states assign family decision makers to act on your behalf if  you do not have an advance directive. Each state has its own laws about advance directives. You may want to check with your health care provider, attorney, or state representative about the laws in your state. Summary Advance directives are legal documents that allow you to make decisions about your health care and medical treatment in case you become unable to communicate for yourself. The process of discussing and writing advance directives should happen over time. You can change and update advance directives at any time. Advance directives may include a medical power of attorney, a living will, and a DNR or DNAR order. This information is not intended to replace advice given to you by your health care provider. Make sure you discuss any questions you have with your health care provider. Document Revised: 05/03/2020 Document Reviewed: 05/03/2020 Elsevier Patient Education  2024 [employer].

## 2023-03-19 NOTE — Progress Notes (Signed)
Subjective  CC:  Chief Complaint  Patient presents with   Follow-up    Aspiration pneumonia      HPI: Deanna Rivera is a 89 y.o. female who presents to the office today to address the problems listed above in the chief complaint. 89 year old here for follow-up.  Has chronic medical conditions, I reviewed recent notes from rheumatology and cardiology.  She was last here in May after hospitalizations for aspiration pneumonia and flu.  Fortunately, she reports she continues to improve. She however is very frail.  Her main barriers remain her ambulatory status, now wheelchair-bound for the most part.  She is able to use the bathroom independently.  She does need help with showers.  She has chronic heart failure and pulmonary hypertension and cardiology is adjusting diuretics.  She currently is using Lasix and spironolactone but only every other day due to stomach upset.  However if the swelling has improved. She has progressive neurologic mediated weakness.  She notices worsening of her swallowing and it is more difficult to talk.  She denies recent aspiration but at times she will cough with meals.  She has not been able to regain the weight that she lost during her hospitalization. She has chronic A-fib and is on chronic anticoagulation without any recent bleeds. Her mood is good overall She does not have advanced directives or medical living will.  Wt Readings from Last 3 Encounters:  03/19/23 118 lb (53.5 kg)  02/21/23 120 lb (54.4 kg)  02/13/23 119 lb (54 kg)  March 2024: 130 pounds  Assessment  1. Acute on chronic heart failure with preserved ejection fraction (HCC)   2. Pulmonary hypertension, unspecified (HCC)   3. Benign essential hypertension   4. Current use of long term anticoagulation   5. Persistent atrial fibrillation (HCC)   6. Polymyalgia rheumatica (HCC)   7. Protein-calorie malnutrition, severe   8. Inclusion body myositis   9. Ambulatory dysfunction   10.  Pharyngeal dysphagia      Plan  Chronic medical conditions reviewed as above: Current medications are appropriate.  She will discuss with cardiology how to manage her diuretics.  She does have persistent swelling today.  No chest pain or shortness of breath.  Her heart rate is controlled.  She continues on long-term anticoagulation.  Blood pressure is regulated Debilitation, nonambulatory and progressive weakness: We had a long conversation regarding her neuromuscular condition.  She follows along with neurology.  We discussed the options of palliative care referral although she declines at this time.  We discussed advance care directives.  Education was given.  Will follow-up in 3 months to fill out these forms.  Currently she will think about it and discuss with her family.  She knows she would never want a feeding tube, be on a respirator for his life prolonged if recovery was not likely. Continue dysphagia diet.  Recommended Ensure protein shakes and other highly caloric soft foods.  I spent a total of 44 minutes for this patient encounter. Time spent included preparation, face-to-face counseling with the patient and coordination of care, review of chart and records, and documentation of the encounter.  Follow up: 3 months for recheck Visit date not found  No orders of the defined types were placed in this encounter.  No orders of the defined types were placed in this encounter.     I reviewed the patients updated PMH, FH, and SocHx.    Patient Active Problem List   Diagnosis Date Noted  Bilateral lower extremity edema 01/15/2023    Priority: High   Pulmonary hypertension, unspecified (HCC) 01/15/2023    Priority: High   Age-related osteoporosis with current pathological fracture 02/06/2021    Priority: High   SSS (sick sinus syndrome) (HCC) 02/11/2019    Priority: High   Status post placement of cardiac pacemaker 07/08/2018    Priority: High   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     Priority: High   Nontoxic multinodular goiter 09/08/2015    Priority: High   Current use of long term anticoagulation 05/16/2015    Priority: High   (HFpEF) heart failure with preserved ejection fraction (HCC) 09/13/2014    Priority: High   Pacemaker 11/09/2011    Priority: High   Impaired fasting glucose 05/23/2009    Priority: High   Long term current use of systemic steroids 03/30/2008    Priority: High   Polymyalgia rheumatica (HCC) 03/30/2008    Priority: High   Benign essential hypertension 05/05/2007    Priority: High   Persistent atrial fibrillation (HCC) 11/06/2006    Priority: High   Pure hypercholesterolemia 11/06/2006    Priority: High   Hypothyroidism due to Hashimoto's thyroiditis, Rx Levothyroxine 06/25/2005    Priority: High   Degenerative lumbar spinal stenosis 05/30/2022    Priority: Medium    Compression fracture of body of thoracic vertebra (HCC) 02/06/2021    Priority: Medium    Primary osteoarthritis of right knee 10/23/2019    Priority: Medium    Nonrheumatic mitral valve regurgitation 09/16/2015    Priority: Medium    Recurrent UTI 03/14/2015    Priority: Medium    Sensorineural hearing loss (SNHL) of both ears 01/14/2022    Priority: Low   Neuralgia, postherpetic 07/02/2019    Priority: Low   OAB (overactive bladder) 07/02/2019    Priority: Low   Constipation 01/18/2019    Priority: Low   Protein-calorie malnutrition, severe 11/06/2022   Frequent falls 05/17/2021   Current Meds  Medication Sig   acetaminophen (TYLENOL) 500 MG tablet Take 500 mg by mouth every 6 (six) hours as needed for mild pain or headache.   apixaban (ELIQUIS) 2.5 MG TABS tablet Take 1 tablet (2.5 mg total) by mouth 2 (two) times daily.   Cholecalciferol (VITAMIN D3) 25 MCG (1000 UT) CAPS Take 1 capsule by mouth daily.   digoxin (LANOXIN) 0.125 MG tablet TAKE 1 TABLET BY MOUTH DAILY   dorzolamide-timolol (COSOPT) 22.3-6.8 MG/ML ophthalmic solution Place 1 drop into  both eyes 2 (two) times daily.   furosemide (LASIX) 20 MG tablet Take 1 tablet (20 mg total) by mouth daily.   latanoprost (XALATAN) 0.005 % ophthalmic solution Place 1 drop into both eyes at bedtime.   levothyroxine (SYNTHROID) 112 MCG tablet TAKE 1 TABLET BY MOUTH DAILY  BEFORE BREAKFAST   metoprolol succinate (TOPROL-XL) 50 MG 24 hr tablet TAKE 1 TABLET BY MOUTH DAILY   polyethylene glycol (MIRALAX / GLYCOLAX) 17 g packet Take 17 g by mouth daily as needed for severe constipation.   predniSONE (DELTASONE) 1 MG tablet TAKE 1 TABLET BY MOUTH DAILY   spironolactone (ALDACTONE) 25 MG tablet Take 0.5 tablets (12.5 mg total) by mouth daily.    Allergies: Patient is allergic to shellfish-derived products, epinephrine, monosodium glutamate, monosodium glutamate, ciprofloxacin, dronedarone, erythromycin, sulfa antibiotics, sulfamethoxazole-trimethoprim, and tetracyclines & related. Family History: Patient family history includes Cirrhosis (age of onset: 72) in her father; Early death (age of onset: 65) in her mother; Heart disease in her mother; Hypertension  in her brother, brother, father, son, and son; Lung cancer in her brother; Other in her brother; Rheumatic fever in her mother; Scleroderma in her daughter. Social History:  Patient  reports that she has quit smoking. Her smoking use included cigarettes. She has been exposed to tobacco smoke. She has never used smokeless tobacco. She reports that she does not currently use alcohol. She reports that she does not currently use drugs.  Review of Systems: Constitutional: Negative for fever malaise or anorexia Cardiovascular: negative for chest pain Respiratory: negative for SOB or persistent cough Gastrointestinal: negative for abdominal pain  Objective  Vitals: BP 114/80   Pulse (!) 59   Temp 97.6 F (36.4 C)   Ht 5\' 4"  (1.626 m)   Wt 118 lb (53.5 kg) Comment: Pt reported  SpO2 97%   BMI 20.25 kg/m  General: no acute distress , A&Ox3,  sitting comfortably in wheelchair HEENT: PEERL, conjunctiva normal, neck is supple Cardiovascular: Very irregular with murmur Respiratory:  Good breath sounds bilaterally, CTAB with normal respiratory effort Extremities: 2+ pitting edema, bilateral ankles  Commons side effects, risks, benefits, and alternatives for medications and treatment plan prescribed today were discussed, and the patient expressed understanding of the given instructions. Patient is instructed to call or message via MyChart if he/she has any questions or concerns regarding our treatment plan. No barriers to understanding were identified. We discussed Red Flag symptoms and signs in detail. Patient expressed understanding regarding what to do in case of urgent or emergency type symptoms.  Medication list was reconciled, printed and provided to the patient in AVS. Patient instructions and summary information was reviewed with the patient as documented in the AVS. This note was prepared with assistance of Dragon voice recognition software. Occasional wrong-word or sound-a-like substitutions may have occurred due to the inherent limitations of voice recognition software

## 2023-03-21 ENCOUNTER — Ambulatory Visit: Payer: Medicare Other | Admitting: Rheumatology

## 2023-03-22 ENCOUNTER — Ambulatory Visit: Payer: Medicare Other | Admitting: Rheumatology

## 2023-03-22 ENCOUNTER — Encounter: Payer: Self-pay | Admitting: Rheumatology

## 2023-03-22 DIAGNOSIS — E042 Nontoxic multinodular goiter: Secondary | ICD-10-CM | POA: Diagnosis present

## 2023-03-22 DIAGNOSIS — I4819 Other persistent atrial fibrillation: Secondary | ICD-10-CM | POA: Diagnosis present

## 2023-03-22 DIAGNOSIS — Z7952 Long term (current) use of systemic steroids: Secondary | ICD-10-CM | POA: Insufficient documentation

## 2023-03-22 DIAGNOSIS — Z8269 Family history of other diseases of the musculoskeletal system and connective tissue: Secondary | ICD-10-CM | POA: Diagnosis present

## 2023-03-22 DIAGNOSIS — I340 Nonrheumatic mitral (valve) insufficiency: Secondary | ICD-10-CM | POA: Diagnosis present

## 2023-03-22 DIAGNOSIS — N3281 Overactive bladder: Secondary | ICD-10-CM | POA: Diagnosis present

## 2023-03-22 DIAGNOSIS — M25512 Pain in left shoulder: Secondary | ICD-10-CM | POA: Diagnosis present

## 2023-03-22 DIAGNOSIS — E559 Vitamin D deficiency, unspecified: Secondary | ICD-10-CM | POA: Insufficient documentation

## 2023-03-22 DIAGNOSIS — R296 Repeated falls: Secondary | ICD-10-CM | POA: Diagnosis present

## 2023-03-22 DIAGNOSIS — E7800 Pure hypercholesterolemia, unspecified: Secondary | ICD-10-CM | POA: Insufficient documentation

## 2023-03-22 DIAGNOSIS — E43 Unspecified severe protein-calorie malnutrition: Secondary | ICD-10-CM | POA: Insufficient documentation

## 2023-03-22 DIAGNOSIS — S22000A Wedge compression fracture of unspecified thoracic vertebra, initial encounter for closed fracture: Secondary | ICD-10-CM | POA: Insufficient documentation

## 2023-03-22 DIAGNOSIS — I495 Sick sinus syndrome: Secondary | ICD-10-CM | POA: Insufficient documentation

## 2023-03-22 DIAGNOSIS — R768 Other specified abnormal immunological findings in serum: Secondary | ICD-10-CM

## 2023-03-22 DIAGNOSIS — G8929 Other chronic pain: Secondary | ICD-10-CM

## 2023-03-22 DIAGNOSIS — M1711 Unilateral primary osteoarthritis, right knee: Secondary | ICD-10-CM | POA: Diagnosis not present

## 2023-03-22 DIAGNOSIS — Z7901 Long term (current) use of anticoagulants: Secondary | ICD-10-CM | POA: Insufficient documentation

## 2023-03-22 DIAGNOSIS — E038 Other specified hypothyroidism: Secondary | ICD-10-CM | POA: Insufficient documentation

## 2023-03-22 DIAGNOSIS — E063 Autoimmune thyroiditis: Secondary | ICD-10-CM | POA: Diagnosis present

## 2023-03-22 DIAGNOSIS — G7241 Inclusion body myositis [IBM]: Secondary | ICD-10-CM | POA: Insufficient documentation

## 2023-03-22 DIAGNOSIS — H903 Sensorineural hearing loss, bilateral: Secondary | ICD-10-CM | POA: Diagnosis present

## 2023-03-22 DIAGNOSIS — I5033 Acute on chronic diastolic (congestive) heart failure: Secondary | ICD-10-CM | POA: Diagnosis present

## 2023-03-22 DIAGNOSIS — I38 Endocarditis, valve unspecified: Secondary | ICD-10-CM | POA: Diagnosis present

## 2023-03-22 DIAGNOSIS — I10 Essential (primary) hypertension: Secondary | ICD-10-CM | POA: Insufficient documentation

## 2023-03-22 DIAGNOSIS — M25511 Pain in right shoulder: Secondary | ICD-10-CM | POA: Diagnosis not present

## 2023-03-22 DIAGNOSIS — Z950 Presence of cardiac pacemaker: Secondary | ICD-10-CM | POA: Diagnosis present

## 2023-03-22 DIAGNOSIS — M8000XD Age-related osteoporosis with current pathological fracture, unspecified site, subsequent encounter for fracture with routine healing: Secondary | ICD-10-CM | POA: Insufficient documentation

## 2023-04-11 ENCOUNTER — Ambulatory Visit (HOSPITAL_COMMUNITY)
Admission: RE | Admit: 2023-04-11 | Discharge: 2023-04-11 | Disposition: A | Discharge status: Home / Self Care | Payer: Medicare Other | Source: Ambulatory Visit | Attending: Cardiology | Admitting: Cardiology

## 2023-04-11 ENCOUNTER — Encounter (HOSPITAL_COMMUNITY): Payer: Self-pay | Admitting: Cardiology

## 2023-04-11 VITALS — BP 92/60 | HR 60 | Wt 119.0 lb

## 2023-04-11 DIAGNOSIS — I082 Rheumatic disorders of both aortic and tricuspid valves: Secondary | ICD-10-CM | POA: Diagnosis not present

## 2023-04-11 DIAGNOSIS — R54 Age-related physical debility: Secondary | ICD-10-CM | POA: Insufficient documentation

## 2023-04-11 DIAGNOSIS — I495 Sick sinus syndrome: Secondary | ICD-10-CM | POA: Insufficient documentation

## 2023-04-11 DIAGNOSIS — I272 Pulmonary hypertension, unspecified: Secondary | ICD-10-CM | POA: Diagnosis not present

## 2023-04-11 DIAGNOSIS — Z950 Presence of cardiac pacemaker: Secondary | ICD-10-CM | POA: Diagnosis not present

## 2023-04-11 DIAGNOSIS — I5030 Unspecified diastolic (congestive) heart failure: Secondary | ICD-10-CM | POA: Diagnosis not present

## 2023-04-11 DIAGNOSIS — I4819 Other persistent atrial fibrillation: Secondary | ICD-10-CM | POA: Insufficient documentation

## 2023-04-11 DIAGNOSIS — M353 Polymyalgia rheumatica: Secondary | ICD-10-CM | POA: Diagnosis not present

## 2023-04-11 DIAGNOSIS — Z79899 Other long term (current) drug therapy: Secondary | ICD-10-CM | POA: Insufficient documentation

## 2023-04-11 DIAGNOSIS — I5033 Acute on chronic diastolic (congestive) heart failure: Secondary | ICD-10-CM

## 2023-04-11 DIAGNOSIS — Z993 Dependence on wheelchair: Secondary | ICD-10-CM | POA: Diagnosis not present

## 2023-04-11 DIAGNOSIS — I5032 Chronic diastolic (congestive) heart failure: Secondary | ICD-10-CM

## 2023-04-11 NOTE — Patient Instructions (Addendum)
Medication Changes:  No Changes In Medications at this time.   You have graduated from the heart failure clinic  Please Follow-Up: With your general cardiologist   At the [facility], you and your health needs are our priority. We have a designated team specialized in the treatment of Heart Failure. This Care Team includes your primary Heart Failure Specialized Cardiologist (physician), Advanced Practice Providers (APPs- Physician Assistants and Nurse Practitioners), and Pharmacist who all work together to provide you with the care you need, when you need it.   You may see any of the following providers on your designated Care Team at your next follow up:  Dr. Arvilla Meres Dr. Marca Ancona Dr. Marcos Eke, NP Robbie Lis, [place] [facility] [place], [place] Brynda Peon, NP Karle Plumber, PharmD   Please be sure to bring in all your medications bottles to every appointment.   Need to Contact us:  If you have any questions or concerns before your next appointment please send [place] a message through [place] or call our office at [phone].    TO LEAVE A MESSAGE FOR THE NURSE SELECT OPTION 2, PLEASE LEAVE A MESSAGE INCLUDING: YOUR NAME DATE OF BIRTH CALL BACK NUMBER REASON FOR CALL**this is important as we prioritize the call backs  YOU WILL RECEIVE A CALL BACK THE SAME DAY AS LONG AS YOU CALL BEFORE 4:00 PM

## 2023-04-11 NOTE — Progress Notes (Signed)
[facility] NOTE  Referring Physician: Willow Ora, MD  Primary Care: Deanna Ora, MD   HPI: Deanna Rivera is a 89 y.o. female with heart failure with preserved ejection fraction, persistent atrial fibrillation, sick sinus syndrome status post permanent pacemaker, moderate aortic stenosis and severe tricuspid regurgitation presenting today for further evaluation of pulmonary hypertension.   At baseline, Ms. Deanna Rivera is very limited functionally. She is wheelchair bound and has progressive neuromuscular disorder leading to lower extremity weakness. She has difficulty performing ADLs due to this. Over the past 1 year, echocardiograms have demonstrated moderate-severe biatrial enlargement, moderate-severe TR and findings consistent with Hfpef likely secondary to persistent atrial fibrillation. She has struggled with LE edema. She is very frail.   Interval hx:  Since her last appointment, her LE edema has improved tremendously. She can now wear shoes again. However, she is frustrated by frequent urination and wishes to stop taking her lasix. Otherwise, she remains very functionally limited due to severe arthritis and LE weakness; remains wheelchair bound at this time.   Past Medical History:  Diagnosis Date   Cataract    Hypertension    Moderate aortic stenosis    Moderate mitral regurgitation    Persistent atrial fibrillation (HCC)    PMR (polymyalgia rheumatica) (HCC)    Second degree AV block    Sick sinus syndrome (HCC)    Thyroid disease     Current Outpatient Medications  Medication Sig Dispense Refill   acetaminophen (TYLENOL) 500 MG tablet Take 500 mg by mouth every 6 (six) hours as needed for mild pain or headache.     apixaban (ELIQUIS) 2.5 MG TABS tablet Take 1 tablet (2.5 mg total) by mouth 2 (two) times daily. 60 tablet 0   digoxin (LANOXIN) 0.125 MG tablet TAKE 1 TABLET BY MOUTH DAILY 90 tablet 3   dorzolamide-timolol (COSOPT) 22.3-6.8 MG/ML  ophthalmic solution Place 1 drop into both eyes 2 (two) times daily.     furosemide (LASIX) 20 MG tablet Take 1 tablet (20 mg total) by mouth daily. 90 tablet 3   latanoprost (XALATAN) 0.005 % ophthalmic solution Place 1 drop into both eyes at bedtime.     levothyroxine (SYNTHROID) 112 MCG tablet TAKE 1 TABLET BY MOUTH DAILY  BEFORE BREAKFAST 90 tablet 2   metoprolol succinate (TOPROL-XL) 50 MG 24 hr tablet TAKE 1 TABLET BY MOUTH DAILY 90 tablet 3   polyethylene glycol (MIRALAX / GLYCOLAX) 17 g packet Take 17 g by mouth daily as needed for severe constipation.     predniSONE (DELTASONE) 1 MG tablet TAKE 1 TABLET BY MOUTH DAILY 90 tablet 3   spironolactone (ALDACTONE) 25 MG tablet Take 0.5 tablets (12.5 mg total) by mouth daily. 45 tablet 3   No current facility-administered medications for this encounter.    Allergies  Allergen Reactions   Shellfish-Derived Products Shortness Of Breath and Other (See Comments)    Lips go numb   Epinephrine Other (See Comments) and Hypertension    "Makes me feel wired"   Monosodium Glutamate Other (See Comments)    other   Monosodium Glutamate Hypertension and Other (See Comments)    "Makes me feel badly"  Other Reaction(s): Other (See Comments)  other, other   Ciprofloxacin Other (See Comments)    Allergic reaction not recalled  Other Reaction(s): Other (See Comments)  Other reaction(s): Intolerance   Dronedarone     Other Reaction(s): Other (See Comments)  Leg swelling, Leg swelling   Erythromycin Other (  See Comments)    Allergic reaction not recalled   Sulfa Antibiotics Other (See Comments)    Allergic reaction not recalled   Sulfamethoxazole-Trimethoprim    Tetracyclines & Related Other (See Comments)    Allergic reaction not recalled      Social History   Socioeconomic History   Marital status: Married    Spouse name: Not on file   Number of children: Not on file   Years of education: Not on file   Highest education level: Not  on file  Occupational History   Occupation: Retired     Comment: Midwife   Tobacco Use   Smoking status: Former    Types: Cigarettes    Passive exposure: Past   Smokeless tobacco: Never  Vaping Use   Vaping status: Never Used  Substance and Sexual Activity   Alcohol use: Not Currently   Drug use: Not Currently   Sexual activity: Not Currently    Birth control/protection: Post-menopausal  Other Topics Concern   Not on file  Social History Narrative   ** Merged History Encounter **       Recently moved from [place]. In independent living with her husband. Daughter died from scleroderma complications. Hx of falls, but last was > 1.5 years ago. 01/18/2019    Social Determinants of Health   Financial Resource Strain: Low Risk  (02/07/2023)   Overall Financial Resource Strain (CARDIA)    Difficulty of Paying Living Expenses: Not hard at all  Food Insecurity: No Food Insecurity (02/07/2023)   Hunger Vital Sign    Worried About Running Out of Food in the Last Year: Never true    Ran Out of Food in the Last Year: Never true  Transportation Needs: No Transportation Needs (02/07/2023)   PRAPARE - Administrator, Civil Service (Medical): No    Lack of Transportation (Non-Medical): No  Physical Activity: Sufficiently Active (02/07/2023)   Exercise Vital Sign    Days of Exercise per Week: 5 days    Minutes of Exercise per Session: 30 min  Stress: Stress Concern Present (02/07/2023)   [employer] of Occupational Health - Occupational Stress Questionnaire    Feeling of Stress : To some extent  Social Connections: Moderately Integrated (02/07/2023)   Social Connection and Isolation Panel [NHANES]    Frequency of Communication with Friends and Family: More than three times a week    Frequency of Social Gatherings with Friends and Family: More than three times a week    Attends Religious Services: More than 4 times per year    Active Member of [employer] or  Organizations: No    Attends Banker Meetings: Never    Marital Status: Married  Catering manager Violence: Not At Risk (02/07/2023)   Humiliation, Afraid, Rape, and Kick questionnaire    Fear of Current or Ex-Partner: No    Emotionally Abused: No    Physically Abused: No    Sexually Abused: No      Family History  Problem Relation Age of Onset   Early death Mother 42   Rheumatic fever Mother    Heart disease Mother        valve replacement   Cirrhosis Father 53   Hypertension Father    Hypertension Brother    Lung cancer Brother    Hypertension Brother    Other Brother        smoker   Scleroderma Daughter    Hypertension Son    Hypertension Son  PHYSICAL EXAM: Vitals:   04/11/23 1002  BP: 92/60  Pulse: 60  SpO2: 99%   GENERAL: frail; eldery WF in wheelchair HEENT: Negative for arcus senilis or xanthelasma. There is no scleral icterus.  The mucous membranes are pink and moist.   NECK: Supple, No masses. Normal carotid upstrokes without bruits. No masses or thyromegaly.    CHEST: There are no chest wall deformities. There is no chest wall tenderness. Respirations are unlabored.  Lungs- decreased at bases CARDIAC:  JVP: 9 cm          Normal rate with regular rhythm. No murmurs, rubs or gallops.  Pulses are 2+ and symmetrical in upper and lower extremities. 2+ edema.  ABDOMEN: Soft, non-tender, non-distended. There are no masses or hepatomegaly. There are normal bowel sounds.  EXTREMITIES: Warm and well perfused with no cyanosis, clubbing.  LYMPHATIC: No axillary or supraclavicular lymphadenopathy.  NEUROLOGIC: Patient is oriented x3 with no focal or lateralizing neurologic deficits.  PSYCH: Patients affect is appropriate, there is no evidence of anxiety or depression.  SKIN: Warm and dry; no lesions or wounds.    DATA REVIEW  ECG: 01/25/23: Ventricular paced rhythm as per my personal interpretation  ECHO: LVEF 65 to 70%, moderate to severe  tricuspid regurgitation, normal RV function, moderate mitral regurgitation as per my personal interpretation  ASSESSMENT & PLAN:  Heart failure with preserved ejection fraction -continue spironolactone 12.5mg  daily.  -Continue lasix 40 to 20mg  daily depending on LE edema and volume stutus.  -Will defer further management to general cardiology. She is not a candidate for advanced heart failure and symptom control with loop diuretics are her only option at this time.   2.  Evaluation for pulmonary hypertension -Patient potentially has underlying moderate pulmonary hypertension from longstanding elevated LA pressures (group 2 PH).  However, due to her multiple comorbid conditions she would likely not tolerate PAH therapy well.  At this time would focus on treatment of her HFpEF and volume overload. - She is  non-ambulatory; would target symptom control with diuretics.  - Can consider palliative care conversations if symptoms do not improve with diuretics. At this time she is not a candidate for advanced therapies.    Brixon Zhen Advanced Heart Failure Mechanical Circulatory Support

## 2023-04-17 ENCOUNTER — Telehealth: Payer: Self-pay | Admitting: Family Medicine

## 2023-04-17 NOTE — Telephone Encounter (Signed)
Pt had pneumonia and wanted to make sure she can take the flu shot. Please call pt back with details.

## 2023-04-23 ENCOUNTER — Telehealth: Payer: Self-pay | Admitting: Family Medicine

## 2023-04-23 NOTE — Telephone Encounter (Signed)
Shanda Bumps, pharmacy technician with [employer] called requesting approval from pcp in regards to change of levothyroxine manufacturer. States they are switching from Amneal to Lupin. Approval can be given by calling [phone]. Reference number is [id number]

## 2023-04-23 NOTE — Telephone Encounter (Signed)
Spoke with pharmacy to give approval for change

## 2023-04-25 ENCOUNTER — Ambulatory Visit: Payer: Medicare Other | Attending: Internal Medicine | Admitting: Internal Medicine

## 2023-04-25 ENCOUNTER — Encounter: Payer: Self-pay | Admitting: Internal Medicine

## 2023-04-25 VITALS — BP 122/62 | HR 60 | Ht 64.0 in | Wt 117.0 lb

## 2023-04-25 DIAGNOSIS — I5032 Chronic diastolic (congestive) heart failure: Secondary | ICD-10-CM | POA: Diagnosis present

## 2023-04-25 DIAGNOSIS — I10 Essential (primary) hypertension: Secondary | ICD-10-CM | POA: Diagnosis present

## 2023-04-25 DIAGNOSIS — I38 Endocarditis, valve unspecified: Secondary | ICD-10-CM | POA: Insufficient documentation

## 2023-04-25 DIAGNOSIS — R001 Bradycardia, unspecified: Secondary | ICD-10-CM | POA: Insufficient documentation

## 2023-04-25 DIAGNOSIS — I442 Atrioventricular block, complete: Secondary | ICD-10-CM | POA: Diagnosis not present

## 2023-04-25 NOTE — Progress Notes (Signed)
HPI Deanna Rivera returns today for ongoing evaluation of her heart block, PAF, chronic peripheral edema, and HTN, s/ PPM insertion. She has longstanding peripheral edema which has improved. She has chronic dyspnea. No syncope. She is s/p PM gen change out.     Allergies  Allergen Reactions   Shellfish-Derived Products Shortness Of Breath and Other (See Comments)    Lips go numb   Epinephrine Other (See Comments) and Hypertension    "Makes me feel wired"   Monosodium Glutamate Other (See Comments)    other   Monosodium Glutamate Hypertension and Other (See Comments)    "Makes me feel badly"  Other Reaction(s): Other (See Comments)  other, other   Ciprofloxacin Other (See Comments)    Allergic reaction not recalled  Other Reaction(s): Other (See Comments)  Other reaction(s): Intolerance   Dronedarone     Other Reaction(s): Other (See Comments)  Leg swelling, Leg swelling   Erythromycin Other (See Comments)    Allergic reaction not recalled   Sulfa Antibiotics Other (See Comments)    Allergic reaction not recalled   Sulfamethoxazole-Trimethoprim    Tetracyclines & Related Other (See Comments)    Allergic reaction not recalled     Current Outpatient Medications  Medication Sig Dispense Refill   acetaminophen (TYLENOL) 500 MG tablet Take 500 mg by mouth every 6 (six) hours as needed for mild pain or headache.     apixaban (ELIQUIS) 2.5 MG TABS tablet Take 1 tablet (2.5 mg total) by mouth 2 (two) times daily. 60 tablet 0   digoxin (LANOXIN) 0.125 MG tablet TAKE 1 TABLET BY MOUTH DAILY 90 tablet 3   dorzolamide-timolol (COSOPT) 22.3-6.8 MG/ML ophthalmic solution Place 1 drop into both eyes 2 (two) times daily.     latanoprost (XALATAN) 0.005 % ophthalmic solution Place 1 drop into both eyes at bedtime.     levothyroxine (SYNTHROID) 112 MCG tablet TAKE 1 TABLET BY MOUTH DAILY  BEFORE BREAKFAST 90 tablet 2   metoprolol succinate (TOPROL-XL) 50 MG 24 hr tablet TAKE 1  TABLET BY MOUTH DAILY 90 tablet 3   polyethylene glycol (MIRALAX / GLYCOLAX) 17 g packet Take 17 g by mouth daily as needed for severe constipation.     predniSONE (DELTASONE) 1 MG tablet TAKE 1 TABLET BY MOUTH DAILY 90 tablet 3   spironolactone (ALDACTONE) 25 MG tablet Take 0.5 tablets (12.5 mg total) by mouth daily. 45 tablet 3   furosemide (LASIX) 20 MG tablet Take 1 tablet (20 mg total) by mouth daily. 90 tablet 3   No current facility-administered medications for this visit.     Past Medical History:  Diagnosis Date   Cataract    Hypertension    Moderate aortic stenosis    Moderate mitral regurgitation    Persistent atrial fibrillation (HCC)    PMR (polymyalgia rheumatica) (HCC)    Second degree AV block    Sick sinus syndrome (HCC)    Thyroid disease     ROS:   All systems reviewed and negative except as noted in the HPI.   Past Surgical History:  Procedure Laterality Date   ABDOMINAL HYSTERECTOMY     APPENDECTOMY     CESAREAN SECTION     x 3   CHOLECYSTECTOMY     PACEMAKER GENERATOR CHANGE  11/20/2018   [employer] Accolade MRI PPM implanted in [place]   PACEMAKER IMPLANT     PILONIDAL CYST EXCISION       Family History  Problem  Relation Age of Onset   Early death Mother 46   Rheumatic fever Mother    Heart disease Mother        valve replacement   Cirrhosis Father 45   Hypertension Father    Hypertension Brother    Lung cancer Brother    Hypertension Brother    Other Brother        smoker   Scleroderma Daughter    Hypertension Son    Hypertension Son      Social History   Socioeconomic History   Marital status: Married    Spouse name: Not on file   Number of children: Not on file   Years of education: Not on file   Highest education level: Not on file  Occupational History   Occupation: Retired     Comment: Midwife   Tobacco Use   Smoking status: Former    Types: Cigarettes    Passive exposure: Past   Smokeless  tobacco: Never  Advertising account planner   Vaping status: Never Used  Substance and Sexual Activity   Alcohol use: Not Currently   Drug use: Not Currently   Sexual activity: Not Currently    Birth control/protection: Post-menopausal  Other Topics Concern   Not on file  Social History Narrative   ** Merged History Encounter **       Recently moved from [place]. In independent living with her husband. Daughter died from scleroderma complications. Hx of falls, but last was > 1.5 years ago. 01/18/2019    Social Determinants of Health   Financial Resource Strain: Low Risk  (02/07/2023)   Overall Financial Resource Strain (CARDIA)    Difficulty of Paying Living Expenses: Not hard at all  Food Insecurity: No Food Insecurity (02/07/2023)   Hunger Vital Sign    Worried About Running Out of Food in the Last Year: Never true    Ran Out of Food in the Last Year: Never true  Transportation Needs: No Transportation Needs (02/07/2023)   PRAPARE - Administrator, Civil Service (Medical): No    Lack of Transportation (Non-Medical): No  Physical Activity: Sufficiently Active (02/07/2023)   Exercise Vital Sign    Days of Exercise per Week: 5 days    Minutes of Exercise per Session: 30 min  Stress: Stress Concern Present (02/07/2023)   [employer] of Occupational Health - Occupational Stress Questionnaire    Feeling of Stress : To some extent  Social Connections: Moderately Integrated (02/07/2023)   Social Connection and Isolation Panel [NHANES]    Frequency of Communication with Friends and Family: More than three times a week    Frequency of Social Gatherings with Friends and Family: More than three times a week    Attends Religious Services: More than 4 times per year    Active Member of [employer] or Organizations: No    Attends Banker Meetings: Never    Marital Status: Married  Catering manager Violence: Not At Risk (02/07/2023)   Humiliation, Afraid, Rape, and Kick questionnaire     Fear of Current or Ex-Partner: No    Emotionally Abused: No    Physically Abused: No    Sexually Abused: No     BP 122/62   Pulse 60   Ht 5\' 4"  (1.626 m)   Wt 117 lb (53.1 kg)   SpO2 98%   BMI 20.08 kg/m   Physical Exam:  Well appearing NAD HEENT: Unremarkable Neck:  No JVD, no thyromegally Lymphatics:  No adenopathy Back:  No CVA tenderness Lungs:  Clear with minimal basilar rales HEART:  Regular rate rhythm, no murmurs, no rubs, no clicks Abd:  soft, positive bowel sounds, no organomegally, no rebound, no guarding Ext:  2 plus pulses, 2+ edema, no cyanosis, no clubbing Skin:  No rashes no nodules Neuro:  CN II through XII intact, motor grossly intact  DEVICE  Normal device function.  See PaceArt for details.   Assess/Plan:  Atrial fib - this is now permanent. Her VR is well controlled.  Sinus node dysfunction - she is now in atrial fib all of the time. No symptoms of palpitations. PPM -her [employer] device is working normal. Peripheral edema - this appears to be better. We discussed the pathophysiology and I encouraged her to elevate the foot of her bed, wear support stockings and avoid salty food.   Sharlot Gowda [employer],MD

## 2023-04-25 NOTE — Patient Instructions (Addendum)
Medication Instructions:  Your physician recommends that you continue on your current medications as directed. Please refer to the Current Medication list given to you today.  *If you need a refill on your cardiac medications before your next appointment, please call your pharmacy*  Lab Work: BMP -- Today  If you have labs (blood work) drawn today and your tests are completely normal, you will receive your results only by: MyChart Message (if you have MyChart) OR A paper copy in the mail If you have any lab test that is abnormal or we need to change your treatment, we will call you to review the results.  Testing/Procedures: None ordered.  Follow-Up: At [facility], you and your health needs are our priority.  As part of our continuing mission to provide you with exceptional heart care, we have created designated Provider Care Teams.  These Care Teams include your primary Cardiologist (physician) and Advanced Practice Providers (APPs -  Physician Assistants and Nurse Practitioners) who all work together to provide you with the care you need, when you need it.  Your next appointment:   1 year(s)  The format for your next appointment:   In Person  Provider:   Lewayne Bunting, MD{or one of the following Advanced Practice Providers on your designated Care Team:   Francis Dowse, [place] Casimiro Needle "Mardelle Matte" [place], [place] Earnest Rosier, NP  Important Information About Sugar

## 2023-04-26 LAB — BASIC METABOLIC PANEL
BUN/Creatinine Ratio: 73 — ABNORMAL HIGH (ref 12–28)
BUN: 22 mg/dL (ref 8–27)
CO2: 27 mmol/L (ref 20–29)
Calcium: 10.2 mg/dL (ref 8.7–10.3)
Chloride: 100 mmol/L (ref 96–106)
Creatinine, Ser: 0.3 mg/dL — ABNORMAL LOW (ref 0.57–1.00)
Glucose: 87 mg/dL (ref 70–99)
Potassium: 4.2 mmol/L (ref 3.5–5.2)
Sodium: 139 mmol/L (ref 134–144)
eGFR: 102 mL/min/{1.73_m2} (ref >59)

## 2023-05-07 NOTE — Progress Notes (Signed)
Deanna Rivera is a 89 y.o. female here for a new problem.  History of Present Illness:   No chief complaint on file.   HPI  Sore Posterior *** Knee    Past Medical History:  Diagnosis Date   Cataract    Hypertension    Moderate aortic stenosis    Moderate mitral regurgitation    Persistent atrial fibrillation (HCC)    PMR (polymyalgia rheumatica) (HCC)    Second degree AV block    Sick sinus syndrome (HCC)    Thyroid disease      Social History   Tobacco Use   Smoking status: Former    Types: Cigarettes    Passive exposure: Past   Smokeless tobacco: Never  Vaping Use   Vaping status: Never Used  Substance Use Topics   Alcohol use: Not Currently   Drug use: Not Currently    Past Surgical History:  Procedure Laterality Date   ABDOMINAL HYSTERECTOMY     APPENDECTOMY     CESAREAN SECTION     x 3   CHOLECYSTECTOMY     PACEMAKER GENERATOR CHANGE  11/20/2018   [employer] Accolade MRI PPM implanted in [place]   PACEMAKER IMPLANT     PILONIDAL CYST EXCISION      Family History  Problem Relation Age of Onset   Early death Mother 62   Rheumatic fever Mother    Heart disease Mother        valve replacement   Cirrhosis Father 93   Hypertension Father    Hypertension Brother    Lung cancer Brother    Hypertension Brother    Other Brother        smoker   Scleroderma Daughter    Hypertension Son    Hypertension Son     Allergies  Allergen Reactions   Shellfish-Derived Products Shortness Of Breath and Other (See Comments)    Lips go numb   Epinephrine Other (See Comments) and Hypertension    "Makes me feel wired"   Monosodium Glutamate Other (See Comments)    other   Monosodium Glutamate Hypertension and Other (See Comments)    "Makes me feel badly"  Other Reaction(s): Other (See Comments)  other, other   Ciprofloxacin Other (See Comments)    Allergic reaction not recalled  Other Reaction(s): Other (See Comments)  Other reaction(s):  Intolerance   Dronedarone     Other Reaction(s): Other (See Comments)  Leg swelling, Leg swelling   Erythromycin Other (See Comments)    Allergic reaction not recalled   Sulfa Antibiotics Other (See Comments)    Allergic reaction not recalled   Sulfamethoxazole-Trimethoprim    Tetracyclines & Related Other (See Comments)    Allergic reaction not recalled    Current Medications:   Current Outpatient Medications:    acetaminophen (TYLENOL) 500 MG tablet, Take 500 mg by mouth every 6 (six) hours as needed for mild pain or headache., Disp: , Rfl:    apixaban (ELIQUIS) 2.5 MG TABS tablet, Take 1 tablet (2.5 mg total) by mouth 2 (two) times daily., Disp: 60 tablet, Rfl: 0   digoxin (LANOXIN) 0.125 MG tablet, TAKE 1 TABLET BY MOUTH DAILY, Disp: 90 tablet, Rfl: 3   dorzolamide-timolol (COSOPT) 22.3-6.8 MG/ML ophthalmic solution, Place 1 drop into both eyes 2 (two) times daily., Disp: , Rfl:    furosemide (LASIX) 20 MG tablet, Take 1 tablet (20 mg total) by mouth daily., Disp: 90 tablet, Rfl: 3   latanoprost (XALATAN) 0.005 % ophthalmic solution,  Place 1 drop into both eyes at bedtime., Disp: , Rfl:    levothyroxine (SYNTHROID) 112 MCG tablet, TAKE 1 TABLET BY MOUTH DAILY  BEFORE BREAKFAST, Disp: 90 tablet, Rfl: 2   metoprolol succinate (TOPROL-XL) 50 MG 24 hr tablet, TAKE 1 TABLET BY MOUTH DAILY, Disp: 90 tablet, Rfl: 3   polyethylene glycol (MIRALAX / GLYCOLAX) 17 g packet, Take 17 g by mouth daily as needed for severe constipation., Disp: , Rfl:    predniSONE (DELTASONE) 1 MG tablet, TAKE 1 TABLET BY MOUTH DAILY, Disp: 90 tablet, Rfl: 3   spironolactone (ALDACTONE) 25 MG tablet, Take 0.5 tablets (12.5 mg total) by mouth daily., Disp: 45 tablet, Rfl: 3   Review of Systems:   ROS  Vitals:   There were no vitals filed for this visit.   There is no height or weight on file to calculate BMI.  Physical Exam:   Physical Exam  Assessment and Plan:   ***   I,Alexander Ruley,acting as  a scribe for Jarold Motto, PA.,have documented all relevant documentation on the behalf of Jarold Motto, PA,as directed by  Jarold Motto, PA while in the presence of Jarold Motto, [place].   ***   Jarold Motto, PA-C

## 2023-05-08 ENCOUNTER — Ambulatory Visit: Payer: Medicare Other

## 2023-05-08 ENCOUNTER — Ambulatory Visit: Payer: Medicare Other | Admitting: Physician Assistant

## 2023-05-08 ENCOUNTER — Encounter: Payer: Self-pay | Admitting: Physician Assistant

## 2023-05-08 VITALS — BP 110/70 | HR 59 | Temp 97.8°F | Ht 64.0 in | Wt 116.0 lb

## 2023-05-08 DIAGNOSIS — I442 Atrioventricular block, complete: Secondary | ICD-10-CM

## 2023-05-08 DIAGNOSIS — I4819 Other persistent atrial fibrillation: Secondary | ICD-10-CM | POA: Diagnosis not present

## 2023-05-08 DIAGNOSIS — L989 Disorder of the skin and subcutaneous tissue, unspecified: Secondary | ICD-10-CM | POA: Diagnosis not present

## 2023-05-08 NOTE — Patient Instructions (Signed)
It was great to see you!  Use regular soap and water to clean area DAILY  Cut off small square of xeroform medicated gauze and place on your sore Place sterile bandage on top of the gauze  If any fever, chills, worsening pain/redness, new discharge from area -- please call us!  Take care,  Jarold Motto PA-C

## 2023-05-09 ENCOUNTER — Telehealth: Payer: Self-pay | Admitting: Internal Medicine

## 2023-05-09 NOTE — Telephone Encounter (Signed)
Patient is requesting to speak with the device team.

## 2023-05-09 NOTE — Telephone Encounter (Signed)
Lvm for pt to call back. 

## 2023-05-10 LAB — CUP PACEART REMOTE DEVICE CHECK
Battery Remaining Longevity: 132 mo
Battery Remaining Percentage: 100 %
Brady Statistic RA Percent Paced: 0 %
Brady Statistic RV Percent Paced: 64 %
Date Time Interrogation Session: 20240926162400
Implantable Lead Connection Status: 753985
Implantable Lead Connection Status: 753985
Implantable Lead Implant Date: 20130305
Implantable Lead Implant Date: 20130305
Implantable Lead Location: 753859
Implantable Lead Location: 753860
Implantable Lead Model: 4469
Implantable Lead Model: 4470
Implantable Lead Serial Number: 546229
Implantable Lead Serial Number: 712016
Implantable Pulse Generator Implant Date: 20200409
Lead Channel Impedance Value: 408 Ohm
Lead Channel Pacing Threshold Amplitude: 0.6 V
Lead Channel Pacing Threshold Pulse Width: 0.4 ms
Lead Channel Setting Pacing Amplitude: 1 V
Lead Channel Setting Pacing Pulse Width: 0.4 ms
Lead Channel Setting Sensing Sensitivity: 0.6 mV
Pulse Gen Serial Number: 859007
Zone Setting Status: 755011

## 2023-05-10 NOTE — Telephone Encounter (Signed)
I let the pt know that we got the transmission twice but she did not believe me. I talked her through a manual transmission and now she believes the monitor is working.

## 2023-05-15 NOTE — Progress Notes (Signed)
I saw Rashauna Tep in neurology clinic on 05/22/23 in follow up for inclusion body myositis.  HPI: Aubreigh Fuerte is a 89 y.o. year old female with a history of HTN, CHF, afib s/p PM, hypothyroidism (Hashimoto's), polymyalgia rheumatica, and prior presumptive diagnosis of inclusion body myositis who we last saw on 02/13/23.  To briefly review: Initial consult (08/14/22): Patient had symptoms since at least 1970s when she was having weakness. She was told she had lupus. She had improvement of symptoms. She had weakness for many years at least prior to 2009. Per records: Patient has had symptoms for many years. She has previously been seen by neurology at [facility] (Dr. Remus Loffler 02/05/2008), and [facility]. She had an EMG in 2009 that showed myopathic changes in multiple muscles and a muscle biopsy that showed inflammatory infiltrates per documentation from [facility] in 2019. 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 She may have also been diagnosed with polymyalgia rheumatica or polymyositis in the past as well, per patient. Of note, CK in 2019 was 404.   Patient is currently in wheelchair most of the time for about 3 months. Prior to this, patient was using a walker. When she using her walker, she would fall very often (more than 60 times). She was progressively getting weaker over the years. About 1 year ago she fell and broke a T9 vertebrae. Since this, she has particularly gotten worse. She was told to use a wheelchair to prevent falls. Patient continues to work with PT but is not improving. Patient was referred to NSGY by PCP ([facility]) who said the fracture had healed but that there was degenerative changes and "things did not heal well." She is not a surgical candidate per patient.    Patient has had difficulty swallowing for years (liquids and solids). Of late, it has been worse. Patient was found to have a left neck mass. Patient was to have a swallow test and CT of neck,  but has not done either.   Patient denies significant shortness of breath, including when laying flat.   Per patient, she has had longstanding numbness in feet (called it neuropathy).  She can have some pins and needles at night (mild).   She also endorses poor sleep. She is not sure why she does not sleep well, but says her husband has to get up multiple times per night that wakes patient and she cannot fall back asleep.   Patient currently lives in independent living. She does not like the food and sometimes misses meals. She denies significant weight loss. She gets PT at her facility.   Family history of neuropathy/myopathy/NM disease? Daughter had scleroderma and died at age 42.   Patient has been on long term steroids for decades. She is currently on 1 mg of prednisone daily.  02/13/23: Per my telephone note from 08/21/22: Called patient to discuss the results of her testing. Overall, her CK and ESR are similar to prior. Her CK has been higher in the past. I think her muscle disease is slowly progressive, as would be expected in IBM (what has been her presumed diagnosis in the past). Her ANA was positive (1:1280) with a negative ENA. Patient does not currently see rheumatology. I discussed that they would be helpful to make sure there was not something we were missing. Patient was in agreement with this. 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 She is agreeable to hematology referral for this.  Patient saw hematology who had a low suspicion for MM and felt IFE represented MGUS.   Patient was admitted from 11/03/22 to 11/07/22 for weakness in the setting of recent flu A infection. Per DC summary:  SLP evaluated and patient was taken for an MBS and she is noted to have mild to moderate primary pharyngeal dysphagia is likely chronic in nature and related to other degenerative changes and spurring in the cervical vertebra as well as generalized weakness and oral consent was largely  functional. She had a markedly reduced laryngeal closure tongue base retraction and epiglottic movement of pharyngeal esophageal segment opening. SLP feels that she has been intermittently aspirating for some time and overall weakness has been exacerbated her swallowing difficulty and SLP feels like her dysphagia will wax and wane and the adverse consequences of aspiration will recur at some point in the future.    Patient went to rehab after hospitalization. She feels like baseline in some regards, but now has more difficulty getting up. She has days that she feels strong and some days she feel weak.    Regarding her swallowing, she does endorse difficulty with swallowing. She is choking daily, but maybe not every meal. Patient got speech therapy as well.   She is currently getting physical therapy and occupational therapy.   She has had leg swelling because medications had been stopped during hospitalization. She is now on diuretics.   Patient has an appointment to see rheumatology soon.  Most recent Assessment and Plan (02/13/23): This is Zanyah Lentsch, a 89 y.o. female with progressive weakness since at least the 1970s, with recent worsening after flu and pneumonia.    Her neurological examination is pertinent for generalized weakness both distally and proximally, diminished sensation in bilateral lower extremities, hyporeflexia, and inability to ambulate. Previous extensive work up is summarized above but includes MRI showing fatty infiltration of vastus lateralis and semimembranosus muscles. EMG in 2009 showed normal sensory responses, reduced motor responses, and fibrillation potentials on needle examination. Muscle biopsy in 2009 showed active inflammation and muscle deinnervation. She was given the presumptive diagnosis of inclusion body myositis. Patient is currently wheelchair bound since late 2023.    Patient's symptoms have been present for decades and slowly progressive despite therapy.  This is likely in keeping with previous diagnosis of inclusion body myositis (IBM). This may also be responsible for dysphagia, though there may be a neck mass as well. Patient also has a rheumatologic history (?SLE) and long term treatment with steroids, either of which could contribute. The lack of sensation in lower extremities and "neuropathy" patient describes would be be expected in IBM though. There may be an overlapping polyneuropathy. We previously discussed repeating an EMG to clarify, but patient was not interested in pursuing that.   Her recent worsening is likely secondary to flu and PNA and deconditioning that occurred during hospitalization. She has been doing therapy and seeing some improvement, but not back to her more recent baseline.   Plan: -Discussed PEG due to dysphagia. Patient would not want current. She understands the risks of aspiration. She will continue to think about it, but defers for now. -Continue PT/OT -Speech therapy -Power chair evaluation - patient would benefit from power chair given her limited mobility. I will attempt to order. -See rheumatology next month as planned  Since their last visit: Patient is doing similar to prior. She reports no new issues. She heard people about the power wheelchair, but never heard further after the initial call.  Patient is doing PT and OT. Speech therapy has not started working with her yet, but will. Patient's swallowing continues to get worse. She finds it hard to swallow, including water.   Patient saw rheumatology and had negative myositis work up.  MEDICATIONS:  Outpatient Encounter Medications as of 05/22/2023  Medication Sig   acetaminophen (TYLENOL) 500 MG tablet Take 500 mg by mouth every 6 (six) hours as needed for mild pain or headache.   apixaban (ELIQUIS) 2.5 MG TABS tablet Take 1 tablet (2.5 mg total) by mouth 2 (two) times daily.   digoxin (LANOXIN) 0.125 MG tablet TAKE 1 TABLET BY MOUTH DAILY    dorzolamide-timolol (COSOPT) 22.3-6.8 MG/ML ophthalmic solution Place 1 drop into both eyes 2 (two) times daily.   furosemide (LASIX) 20 MG tablet Take 1 tablet (20 mg total) by mouth daily.   latanoprost (XALATAN) 0.005 % ophthalmic solution Place 1 drop into both eyes at bedtime.   levothyroxine (SYNTHROID) 112 MCG tablet TAKE 1 TABLET BY MOUTH DAILY  BEFORE BREAKFAST   metoprolol succinate (TOPROL-XL) 50 MG 24 hr tablet TAKE 1 TABLET BY MOUTH DAILY   polyethylene glycol (MIRALAX / GLYCOLAX) 17 g packet Take 17 g by mouth daily as needed for severe constipation.   predniSONE (DELTASONE) 1 MG tablet TAKE 1 TABLET BY MOUTH DAILY   spironolactone (ALDACTONE) 25 MG tablet Take 0.5 tablets (12.5 mg total) by mouth daily.   No facility-administered encounter medications on file as of 05/22/2023.    PAST MEDICAL HISTORY: Past Medical History:  Diagnosis Date   Cataract    Hypertension    Moderate aortic stenosis    Moderate mitral regurgitation    Persistent atrial fibrillation (HCC)    PMR (polymyalgia rheumatica) (HCC)    Second degree AV block    Sick sinus syndrome (HCC)    Thyroid disease     PAST SURGICAL HISTORY: Past Surgical History:  Procedure Laterality Date   ABDOMINAL HYSTERECTOMY     APPENDECTOMY     CESAREAN SECTION     x 3   CHOLECYSTECTOMY     PACEMAKER GENERATOR CHANGE  11/20/2018   [employer] Accolade MRI PPM implanted in [place]   PACEMAKER IMPLANT     PILONIDAL CYST EXCISION      ALLERGIES: Allergies  Allergen Reactions   Shellfish-Derived Products Shortness Of Breath and Other (See Comments)    Lips go numb   Epinephrine Other (See Comments) and Hypertension    "Makes me feel wired"   Monosodium Glutamate Other (See Comments)    other   Monosodium Glutamate Hypertension and Other (See Comments)    "Makes me feel badly"  Other Reaction(s): Other (See Comments)  other, other   Ciprofloxacin Other (See Comments)    Allergic reaction not  recalled  Other Reaction(s): Other (See Comments)  Other reaction(s): Intolerance   Dronedarone     Other Reaction(s): Other (See Comments)  Leg swelling, Leg swelling   Erythromycin Other (See Comments)    Allergic reaction not recalled   Sulfa Antibiotics Other (See Comments)    Allergic reaction not recalled   Sulfamethoxazole-Trimethoprim    Tetracyclines & Related Other (See Comments)    Allergic reaction not recalled    FAMILY HISTORY: Family History  Problem Relation Age of Onset   Early death Mother 65   Rheumatic fever Mother    Heart disease Mother        valve replacement   Cirrhosis Father 72   Hypertension Father  Hypertension Brother    Lung cancer Brother    Hypertension Brother    Other Brother        smoker   Scleroderma Daughter    Hypertension Son    Hypertension Son     SOCIAL HISTORY: Social History   Tobacco Use   Smoking status: Former    Types: Cigarettes    Passive exposure: Past   Smokeless tobacco: Never  Vaping Use   Vaping status: Never Used  Substance Use Topics   Alcohol use: Not Currently   Drug use: Not Currently   Social History   Social History Narrative   ** Merged History Encounter **       Recently moved from [place]. In independent living with her husband. Daughter died from scleroderma complications. Hx of falls, but last was > 1.5 years ago. 01/18/2019     Objective:  Vital Signs:  BP (!) 140/57   Pulse 66   Ht 5\' 4"  (1.626 m)   Wt 114 lb (51.7 kg)   SpO2 99%   BMI 19.57 kg/m   General: General appearance: Awake and alert. No distress. Cooperative with exam. Sitting in wheel chair Skin: No obvious rash or jaundice. HEENT: Atraumatic. Anicteric. Lungs: Non-labored breathing on room air   Neurological: Mental Status: Alert. Speech fluent. No pseudobulbar affect Cranial Nerves: CNII: No RAPD. Visual fields intact. CNIII, IV, VI: PERRL. No nystagmus. EOMI. CN V: Facial sensation intact  bilaterally to fine touch. CN VII: Eye closure weak. No ptosis at rest. CN VIII: Hears finger rub well bilaterally. CN IX: No hypophonia. CN X: Palate elevates symmetrically. CN XI: Full strength shoulder shrug bilaterally. CN XII: Tongue protrusion full and midline. No atrophy or fasciculations. Mild dysarthria Motor: Tone is normal.  Individual muscle group testing (MRC grade out of 5):  Movement     Neck flexion 4+    Neck extension 5     Right Left   Shoulder abduction 4 4   Elbow flexion 5 5   Elbow extension 5 5   Finger abduction - FDI 4- 4-   Finger abduction - ADM 4- 4-   Finger extension 4- 4-   Finger distal flexion - 2/3 4- 4-   Finger distal flexion - 4/5 4- 4-   Thumb flexion - FPL 3 2   Thumb abduction - APB 3 3    Hip flexion 4 4   Hip extension 5 5   Hip adduction 5 5   Hip abduction 5 5   Knee extension 4+ 4+   Knee flexion 4 4   Dorsiflexion 3 3   Plantarflexion 5- 5-    Reflexes: Trace throughout Sensation: Pinprick diminished in distal lower extremities Gait: Unable to ambulate (wheel chair bound)   Lab and Test Review: New results: 02/28/23: C3/C4 wnl dsDNA wnl Anti-Sm wnl Scl-70 wnl RNP ab wnl SSA and SSB wnl  02/21/23: CK: 254 Aldolase wnl ESR: 79 ANA positive (1:320, nuclear, homogeneous) Vit D wnl HMGCoAR ab < 2 Myositis panel: Component     Latest Ref Rng 02/21/2023  JO-1 AB     <11 SI <11   PL-7 AB     <11 SI <11   PL-12 AB     <11 SI <11   EJ AB     <11 SI <11   OJ AB     <11 SI <11   SRP-AB     <11 SI <11   MI-2 ALPHA AB     <  11 SI <11   MI-2 BETA AB     <11 SI <11   MDA-5 AB     <11 SI <11   TIF-1y AB     <11 SI <11   NXP-2 AB     <11 SI <11      Previously reviewed results: 08/14/22: IFE: IgG lambda monoclonal ab ANA positive: 1:1280, homogeneous dsDNA, RNP, Sm, Scl-70, SSA, SSB negative B1 wnl B12: 493 CK: 251 ESR: 73   Kappa/lambda ratio wnl   CK (10/29/22): 121 TSH (10/29/22): 0.501 ESR  (11/04/22): 58 CK (11/04/22): 61  External labs: Per [facility] clinic note from 02/05/2008: CK: since 2002, levels in the 192 to 440 (440 in January 2009. December 03 2007 (5 days post emg) 268 ESR remains high since 2002 (44-82)  EMG elsewhere (april 17th 2009) Normal upper extremities sensory nerve responses, but for prolongation of median latency, low motor amplitudes ulnar and tibial, needle electrode examination shows fibrillations and positive sharp waves and small motor unit potentials in all muscles tested (proximal and paraspinals- no hand-forearm muscles).  US neck (may 2009): multinodular goiter  MRI left leg (february 2009) Signal abn in vastus lateralis, fatty infiltration in vastus lat and semimembranosus Right leg: vastus changes  Muscle biopsy (april 1st 2009) Active inflammation with muscle fiber destruction and invasion by lymphocytes, mild type 2 atrophy, stains normal, no inclusion (no EM done)  Blood tests June 15th Cmp normal Ck 124 Crp N ESR 44 No monoclonal protein in blood and urine Hepatitis B serology- no exposure ANA, ENA, RF negative TSH 0.27 PTH 72 (could indicate low vitamin D) CBC N Urinalysis normal    Swallow evaluation (11/05/22): Clinical Impression: Clinical Impression: Ms. Gathright presents with a mild-moderate primary pharyngeal dysphagia that is likely chronic in nature and related to degenerative changes and spurring in cervical vertebrae as well as generalized weakness.  Oral phase was largely functional.  Pharyngeal phase was marked by reduced laryngeal closure, tongue base retraction, epiglottic movement, and pharyngoesophageal segment opening.  These impairments in the swallow were partially due to appearance of osteophytes which inhibited full inversion of epiglottis over larynx.  There was a prominent CP, leading to material collecting above.  Reduced propulsion through the pharynx/PES was also related to inferred weakness of the base of  tongue/pharyngeal wall. There was consistent penetration of thin liquids, often to the level of the vocal folds. Penetration elicited a frequent cough response; there was minimal aspiration.  Solid food residue tended to remain in the valleculae and was difficult to transition.  A chin tuck was trialed, it may have offered marginal benefit in bolus transfer of solids and airway protection with thin liquids. Spent time after the study reviewing imaging in pt's room and discussing results with pt/husband.  She should try tucking chin with all solids/liquids. Continue current diet (regular solids/thin liquids); Pt and husband will need a lot of reinforcement to recall results and precautions. SLP will follow while admitted.   Recommendations/Plan: Swallowing Evaluation Recommendations Swallowing Evaluation Recommendations Recommendations: PO diet PO Diet Recommendation: Regular; Thin liquids (Level 0) Liquid Administration via: Cup; Straw Medication Administration: Whole meds with puree Supervision: Patient able to self-feed Swallowing strategies  : Chin tuck Postural changes: Position pt fully upright for meals   CT head (11/04/22): IMPRESSION: No acute intracranial abnormality noted.   Chronic ischemic changes are noted.   Echo (10/16/22): 1. Left ventricular ejection fraction, by estimation, is 65 to 70%. The  left ventricle has normal function.  The left ventricle has no regional  wall motion abnormalities. Left ventricular diastolic function could not  be evaluated.   2. Right ventricular systolic function is normal. The right ventricular  size is normal. There is severely elevated pulmonary artery systolic  pressure. The estimated right ventricular systolic pressure is 62.1 mmHg.   3. Left atrial size was severely dilated.   4. Right atrial size was severely dilated.   5. The mitral valve is normal in structure. Mild to moderate mitral valve  regurgitation.   6. Tricuspid valve  regurgitation is moderate to severe.   7. The aortic valve is tricuspid. There is mild calcification of the  aortic valve. There is moderate thickening of the aortic valve. Aortic  valve regurgitation is trivial. Moderate aortic valve stenosis.   8. The inferior vena cava is dilated in size with <50% respiratory  variability, suggesting right atrial pressure of 15 mmHg.    Imaging: CT thoracic spine wo contrast (07/11/22): IMPRESSION: Chronic T8 compression fracture with progressive anterior height loss, measuring up to 60%, previously 20% on CT June 2022, with exaggerated thoracic kyphosis centered at T8. Unchanged 3 mm inferior endplate retropulsion with resultant mild spinal canal stenosis at this level.   No new compression fracture.   Mild multilevel degenerative changes of the thoracic spine.   3 mm pulmonary nodule in the left upper lobe. No follow-up needed if patient is low-risk. A 12 month follow-up chest CT can be considered if the patient is high-risk.This recommendation follows the consensus statement: Guidelines for Management of Incidental Pulmonary Nodules Detected on CT Images: From the Fleischner Society 2017; Radiology 2017; 284:228-243.   CT cervical spine wo contrast (08/09/21): IMPRESSION: 1. Slight anterior subluxations at C2-3 and C3-4, similar to prior study, likely degenerative. 2. No acute displaced fractures identified. 3. Diffuse degenerative changes.   CT head wo contrast (09/04/21): IMPRESSION: Atrophy, chronic microvascular disease.   No acute intracranial abnormality.  ASSESSMENT: This is Tiffane Sheldon, a 89 y.o. female with progressive weakness since at least the 4s. Her neurological examination is pertinent for generalized weakness both distally and proximally, diminished sensation in bilateral lower extremities, hyporeflexia, and inability to ambulate. Previous extensive work up is summarized above but includes MRI showing fatty  infiltration of vastus lateralis and semimembranosus muscles. EMG in 2009 showed normal sensory responses, reduced motor responses, and fibrillation potentials on needle examination. Muscle biopsy in 2009 showed active inflammation and muscle deinnervation. She was given the presumptive diagnosis of inclusion body myositis. Patient is currently wheelchair bound since late 2023.    Patient's symptoms have been present for decades and slowly progressive despite therapy. This is likely in keeping with previous diagnosis of inclusion body myositis (IBM). This may also be responsible for dysphagia, though there may be a neck mass as well. The lack of sensation in lower extremities and "neuropathy" patient describes would be be expected in IBM though. 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 We previously discussed repeating an EMG to clarify, but patient was not interested in pursuing that.  Plan: -Will check into power chair as patient would greatly benefit from and would increase her independence -Continue PT and OT -Highly recommend speech therapy. Patient to discuss with her speech therapy. -Again discuss PEG tube, but patient is not interested in this currently  Return to clinic in 6 months  Total time spent reviewing records, interview, history/exam, documentation, and coordination of care on day of encounter:  40 min  Jacquelyne Balint, MD

## 2023-05-22 ENCOUNTER — Ambulatory Visit (INDEPENDENT_AMBULATORY_CARE_PROVIDER_SITE_OTHER): Payer: Medicare Other | Admitting: Neurology

## 2023-05-22 ENCOUNTER — Encounter: Payer: Self-pay | Admitting: Neurology

## 2023-05-22 VITALS — BP 140/57 | HR 66 | Ht 64.0 in | Wt 114.0 lb

## 2023-05-22 DIAGNOSIS — R262 Difficulty in walking, not elsewhere classified: Secondary | ICD-10-CM

## 2023-05-22 DIAGNOSIS — G7241 Inclusion body myositis [IBM]: Secondary | ICD-10-CM

## 2023-05-22 DIAGNOSIS — G629 Polyneuropathy, unspecified: Secondary | ICD-10-CM

## 2023-05-22 DIAGNOSIS — R531 Weakness: Secondary | ICD-10-CM

## 2023-05-22 DIAGNOSIS — R5381 Other malaise: Secondary | ICD-10-CM

## 2023-05-22 DIAGNOSIS — D472 Monoclonal gammopathy: Secondary | ICD-10-CM

## 2023-05-22 NOTE — Patient Instructions (Signed)
I will check into your power wheelchair.  Continue PT and OT.  I highly recommend speech therapy for help with speech and swallowing. Speak to your speech therapy and let me know if there is anything you need from me, such as another order.  I will see you back in 6 months or sooner if needed.  The physicians and staff at [facility] are committed to providing excellent care. You may receive a survey requesting feedback about your experience at our office. We strive to receive "very good" responses to the survey questions. If you feel that your experience would prevent you from giving the office a "very good " response, please contact our office to try to remedy the situation. We may be reached at [phone]. Thank you for taking the time out of your busy day to complete the survey.  Jacquelyne Balint, MD [facility]

## 2023-05-24 ENCOUNTER — Telehealth: Payer: Self-pay | Admitting: Neurology

## 2023-05-24 NOTE — Telephone Encounter (Signed)
Email the orders to [email or web address]  FAX [phone]

## 2023-05-24 NOTE — Telephone Encounter (Signed)
Called and talked with Paul Dykes, received number for Shawna Orleans the ST will call her for orders at [facility] [phone]

## 2023-05-24 NOTE — Progress Notes (Signed)
Remote pacemaker transmission.   

## 2023-05-24 NOTE — Telephone Encounter (Signed)
Patient called , she is needing another order for speech therapy and swallowing therapy

## 2023-05-27 ENCOUNTER — Other Ambulatory Visit: Payer: Self-pay

## 2023-05-27 DIAGNOSIS — G629 Polyneuropathy, unspecified: Secondary | ICD-10-CM

## 2023-05-27 DIAGNOSIS — R209 Unspecified disturbances of skin sensation: Secondary | ICD-10-CM

## 2023-05-27 DIAGNOSIS — R768 Other specified abnormal immunological findings in serum: Secondary | ICD-10-CM

## 2023-05-27 DIAGNOSIS — G7241 Inclusion body myositis [IBM]: Secondary | ICD-10-CM

## 2023-05-27 DIAGNOSIS — R5381 Other malaise: Secondary | ICD-10-CM

## 2023-05-27 DIAGNOSIS — R531 Weakness: Secondary | ICD-10-CM

## 2023-05-27 DIAGNOSIS — D472 Monoclonal gammopathy: Secondary | ICD-10-CM

## 2023-05-27 IMAGING — DX DG KNEE COMPLETE 4+V*R*
4 series · 4 of 4 positions shown · non-contrast
Comparison: 04/02/2019, CT 09/15/2019

CLINICAL DATA: Fall

EXAM:
RIGHT KNEE - COMPLETE 4+ VIEW

[knee ap]
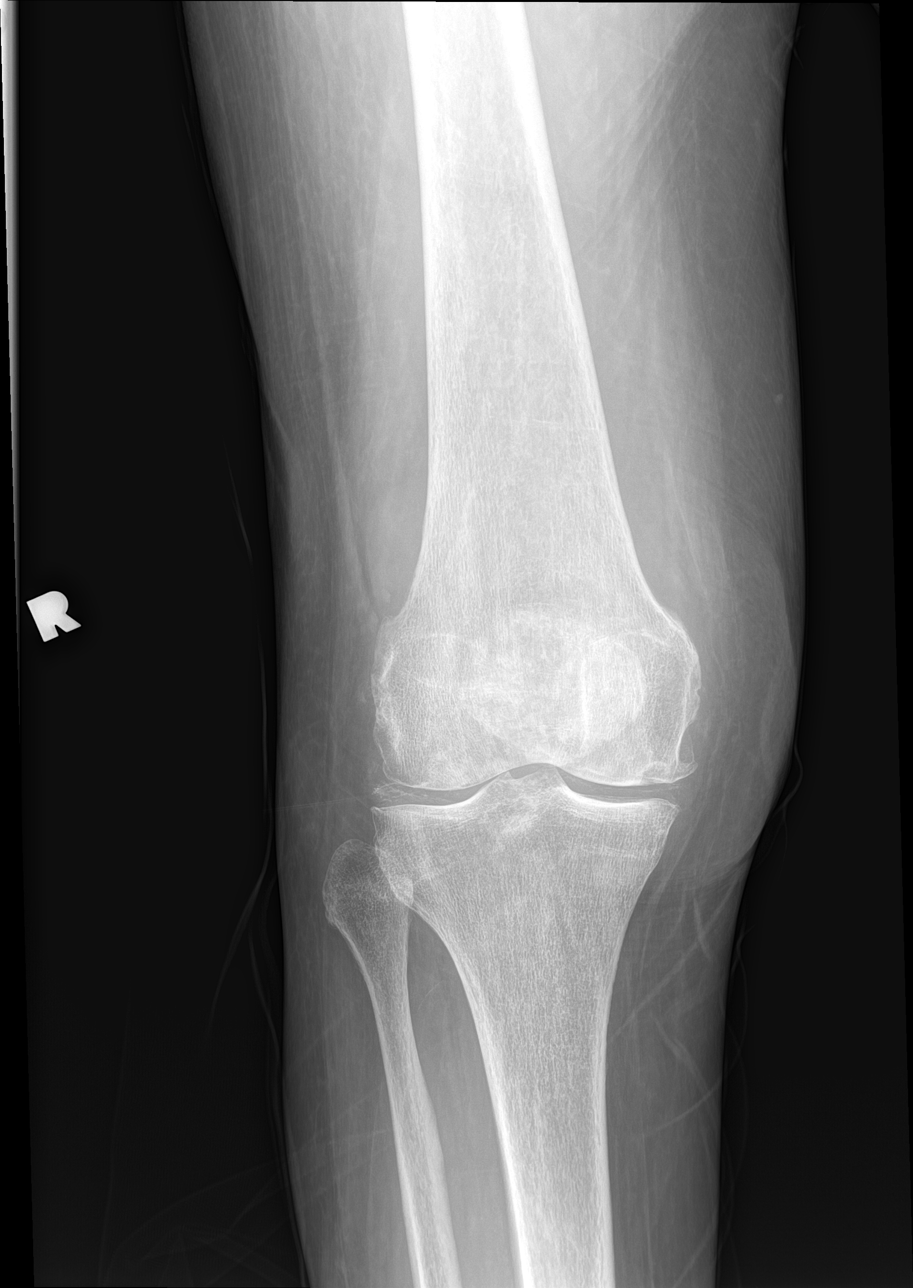

[knee obl (1 of 2)]
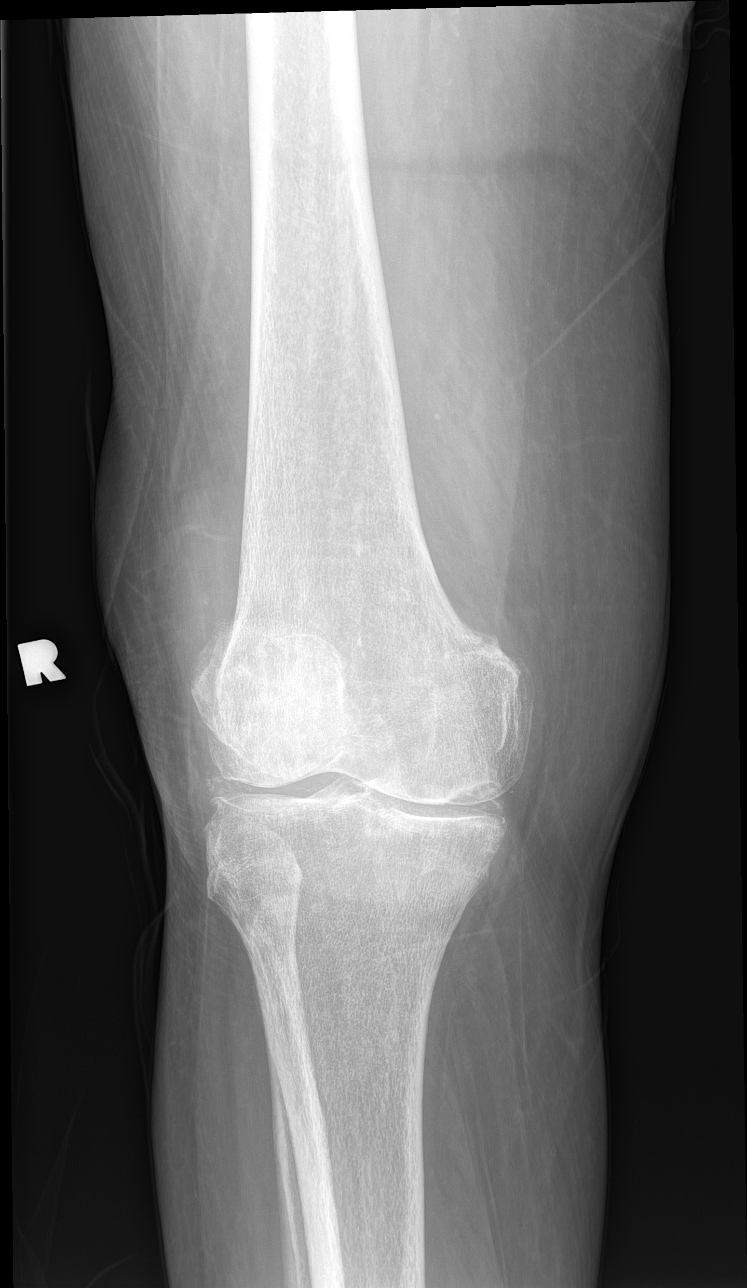

[knee obl (2 of 2)]
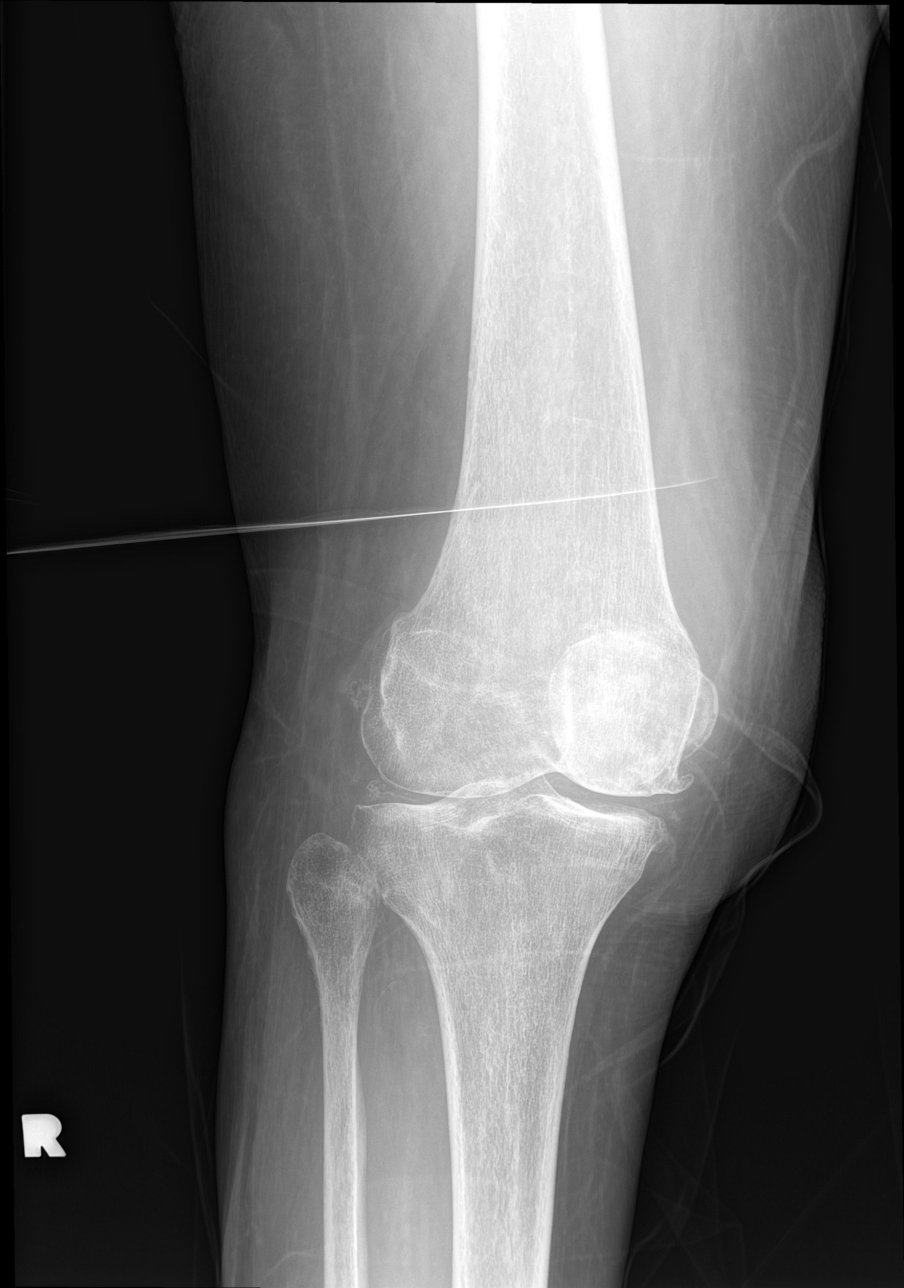

[knee lat]
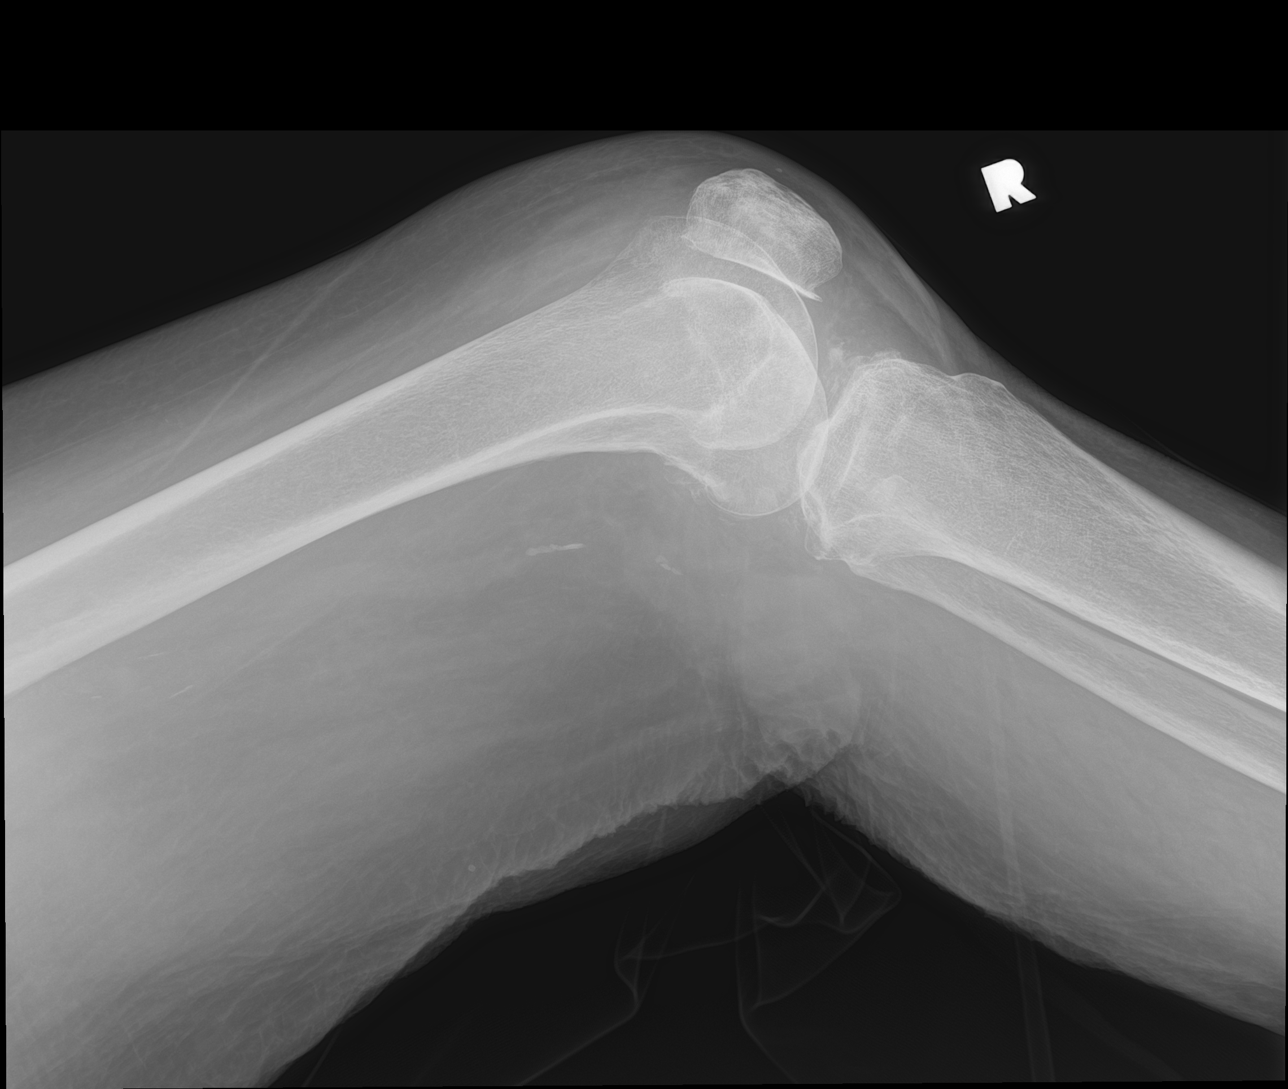

[4 of 4 positions shown; findings below may reference images not displayed]

FINDINGS: No acute displaced fracture or malalignment. Osteochondral lesion at
the medial femoral condyle as noted on prior CT. Moderate
tricompartment arthritis of the knee with probable trace effusion.
Chondrocalcinosis.
IMPRESSION: 1. No acute osseous abnormality
2. Moderate tricompartment arthritis of the knee with small
effusion. Chondrocalcinosis
3. Medial femoral condyle osteochondral lesion

## 2023-05-27 IMAGING — CT CT CERVICAL SPINE W/O CM
3 of 4 series · 11 of 35 positions shown, 13 images · non-contrast
Comparison: MRI cervical spine 02/18/2006

CLINICAL DATA: Acute neck pain after a fall. Prior cervical
surgery.

EXAM:
CT CERVICAL SPINE WITHOUT CONTRAST
TECHNIQUE: Multidetector CT imaging of the cervical spine was performed without
intravenous contrast. Multiplanar CT image reconstructions were also
generated.

[Series 5: cor bone · coronal · 0.31mm/px · 3 of 58 slices shown]
[im 12/58  bone]
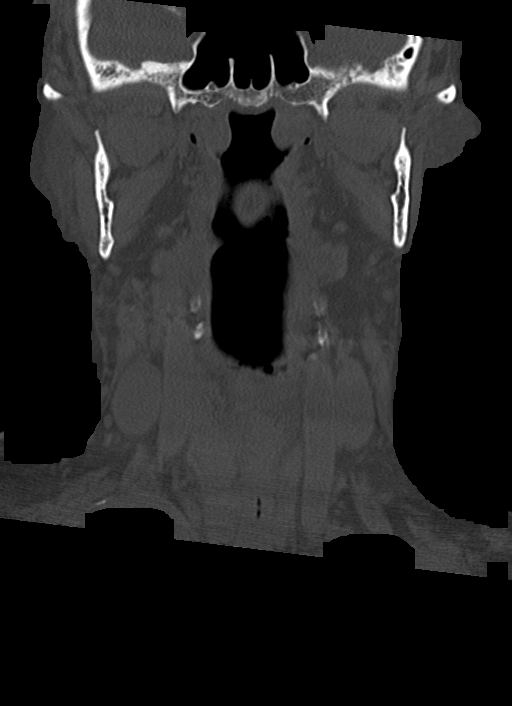
[im 23/58  bone]
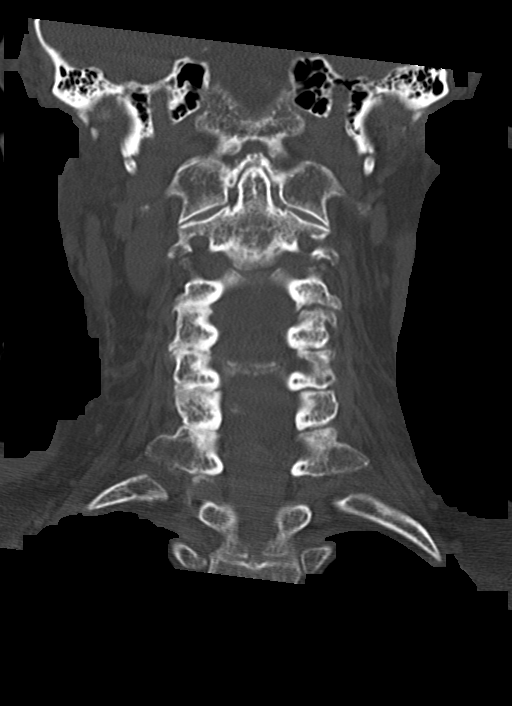
[im 35/58  bone]
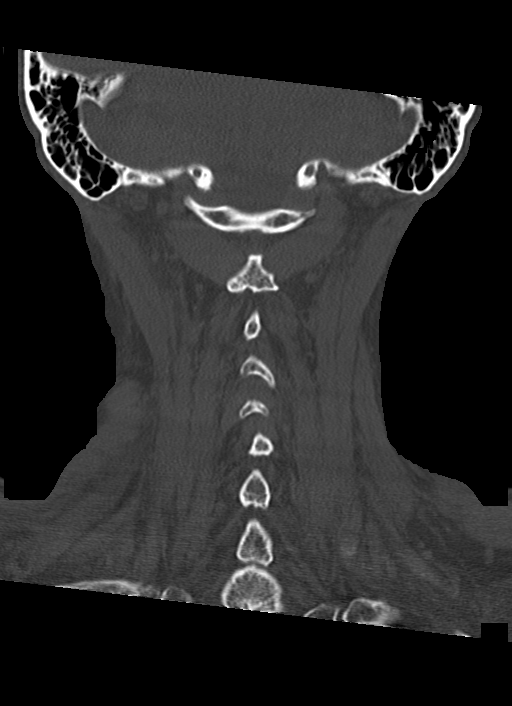

[Series 6: sag bone · sagittal · 0.23mm/px · 5 of 81 slices shown, 6 images]
[im 27/81  bone]
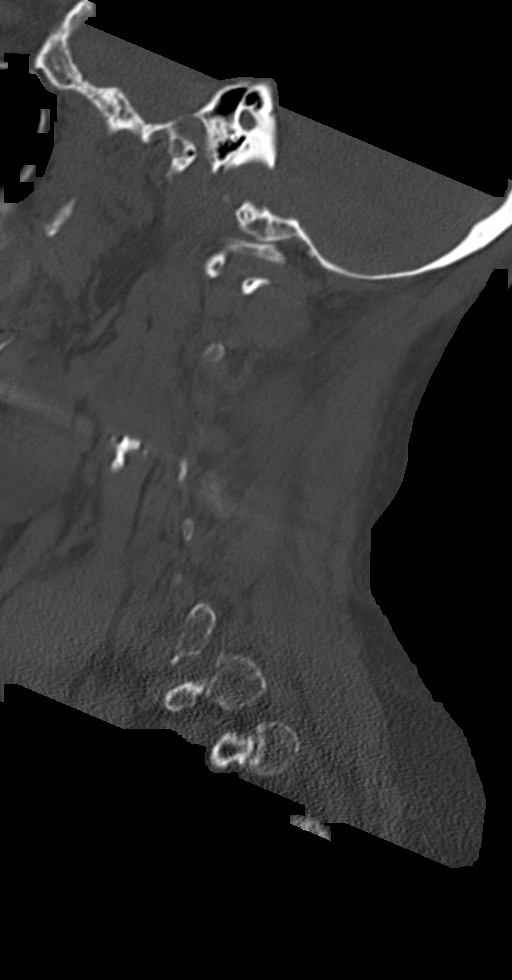
[im 34/81  bone]
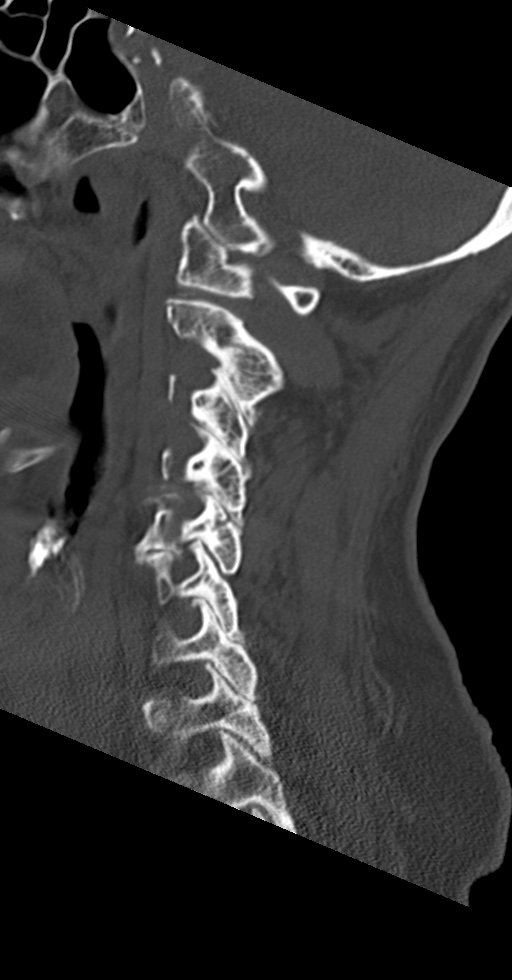
[im 41/81  soft-tissue]
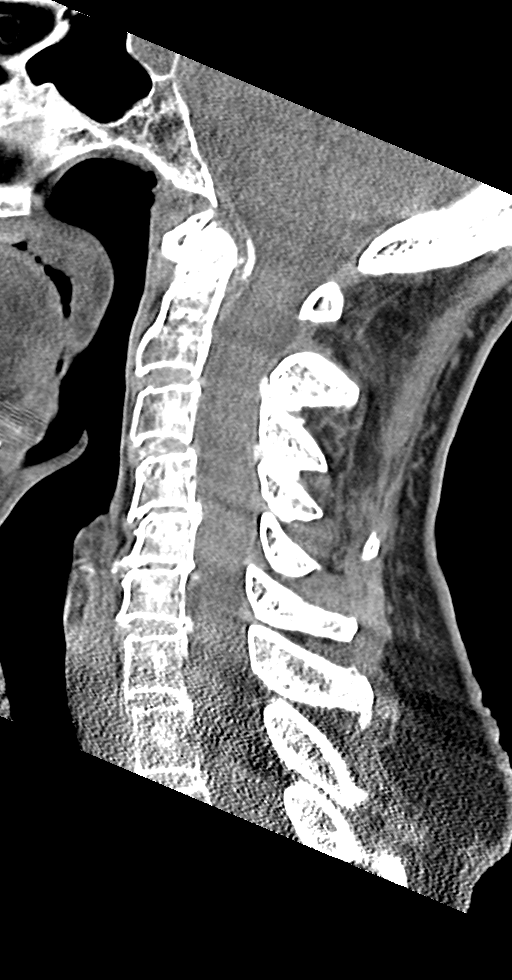
[im 41/81  bone]
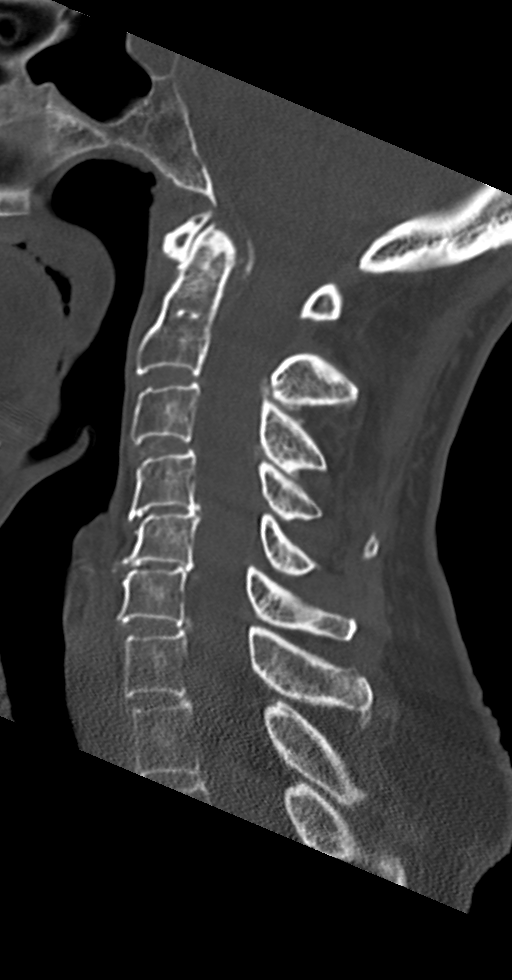
[im 47/81  bone]
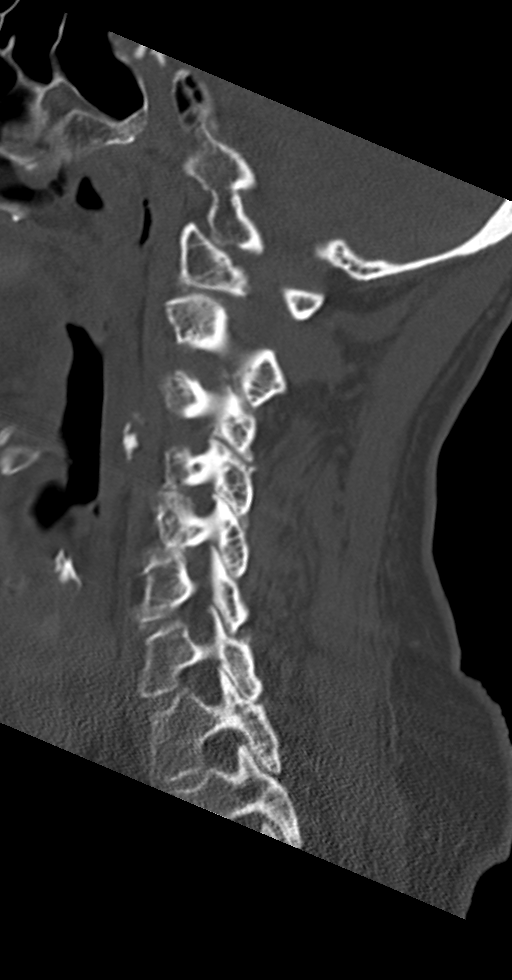
[im 54/81  bone]
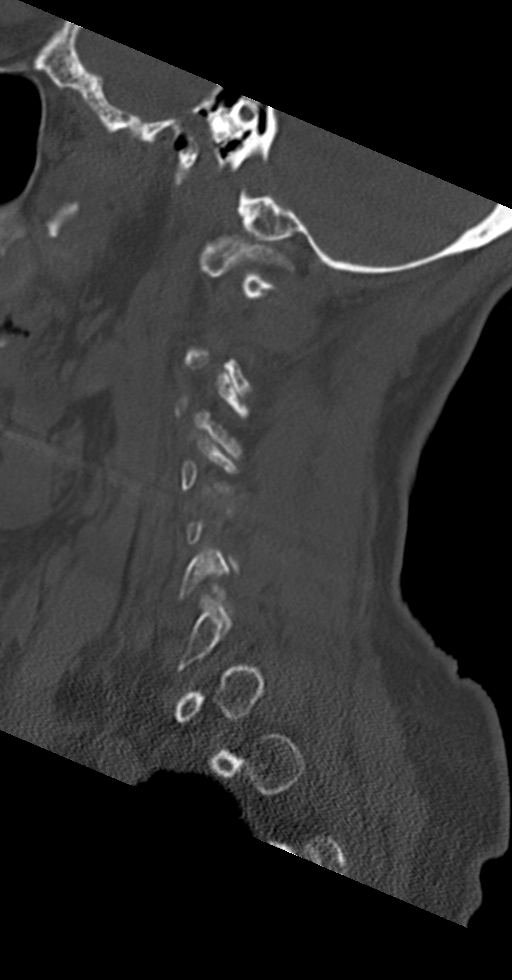

[Series 7: orthogonal axials · axial · 0.21mm/px · z∈[-270,-183]mm · 3 of 76 slices shown, 4 images]
[im 13/76  soft-tissue]
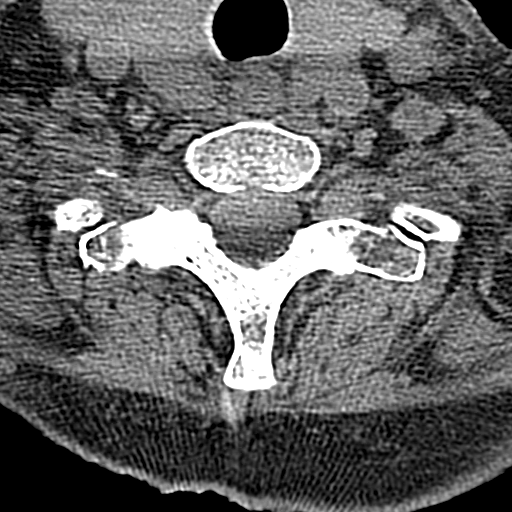
[im 13/76  bone]
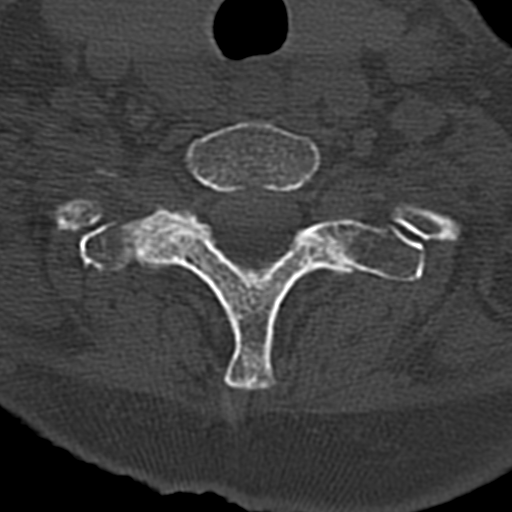
[im 38/76  bone]
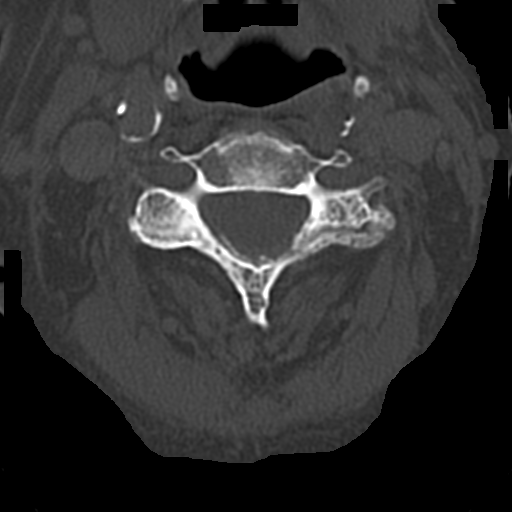
[im 63/76  bone]
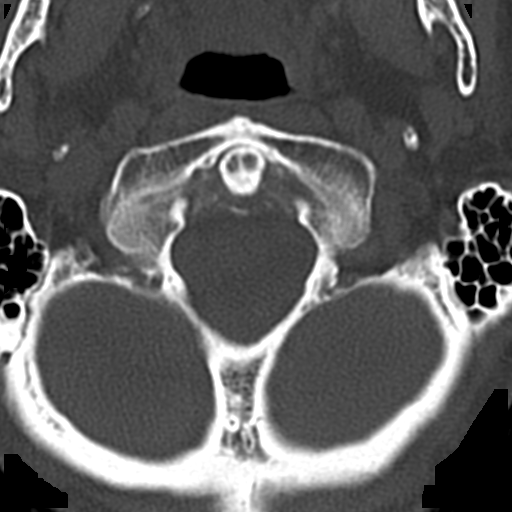

[11 of 35 positions shown; findings below may reference images not displayed]

FINDINGS: Alignment: There is slight anterior subluxation of C2 on C3 and C3
on C4, similar to prior study and likely degenerative. Normal
alignment of the posterior elements. C1-2 articulation appears
intact.

Skull base and vertebrae: Skull base appears intact. No vertebral
compression deformities. No focal bone lesion or bone destruction.
Bone cortex appears intact.

Soft tissues and spinal canal: No prevertebral soft tissue swelling.
No abnormal paraspinal soft tissue mass or infiltration. Cervical
lymph nodes are not pathologically enlarged. Vascular
calcifications.

Disc levels: Degenerative changes with disc space narrowing and
endplate osteophyte formation throughout. Degenerative changes in
the facet joints.

Upper chest: Visualized lung apices are clear.

Other: None.
IMPRESSION: 1. Slight anterior subluxations at C2-3 and C3-4, similar to prior
study, likely degenerative.
2. No acute displaced fractures identified.
3. Diffuse degenerative changes.

## 2023-05-27 NOTE — Telephone Encounter (Signed)
Done  And faxed

## 2023-05-31 ENCOUNTER — Telehealth: Payer: Self-pay | Admitting: Family Medicine

## 2023-05-31 NOTE — Telephone Encounter (Signed)
Patient called stating that skin lesion is still present. States it's not getting worse but it's not getting better. She wants to know if she should continue keeping it covered all the time or do something different. I offered pt an OV today @ 11 am but she declined due to lack of transportation. Please Advise.

## 2023-06-04 NOTE — Telephone Encounter (Signed)
Patient has been scheduled for vv on 10/23 @ 10 am.

## 2023-06-05 ENCOUNTER — Telehealth (INDEPENDENT_AMBULATORY_CARE_PROVIDER_SITE_OTHER): Payer: Medicare Other | Admitting: Family Medicine

## 2023-06-05 VITALS — Ht 64.0 in | Wt 114.0 lb

## 2023-06-05 DIAGNOSIS — S81801D Unspecified open wound, right lower leg, subsequent encounter: Secondary | ICD-10-CM

## 2023-06-05 NOTE — Progress Notes (Signed)
Virtual Visit via Video Note  Subjective  CC:  Chief Complaint  Patient presents with   red spot    Pt stated that she has a red spot on the back of her left left     I connected with Deanna Rivera on 06/05/23 at 10:00 AM EDT by a video enabled telemedicine application and verified that I am speaking with the correct person using two identifiers. Location patient: Home Location provider: [facility] [address] Office Persons participating in the virtual visit: Deanna Rivera, Willow Ora, MD Trudie Reed CMA  I discussed the limitations of evaluation and management by telemedicine and the availability of in person appointments. The patient expressed understanding and agreed to proceed. Reviewed OV from a last month regarding same: this is the pic from 05/08/2023:    Pt reports she can't see wound. But still not healed. No pain or bleeding or drainage.    Assessment  1. Non-healing wound of lower extremity, right, subsequent encounter      Plan  Non healing wound, pressure sore:  start duoderm weekly to get healing. Does not look like a lesion that needs biopsy. Wound remains moist in skin folds ... Will try duoderm.   Follow up: prn 06/20/2023    I reviewed the patients updated PMH, FH, and SocHx.    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    Priority: High   Nontoxic multinodular goiter 09/08/2015    Priority: High   Current use of long term anticoagulation 05/16/2015    Priority: High   (HFpEF) heart failure with preserved  ejection fraction (HCC) 09/13/2014    Priority: High   Pacemaker 11/09/2011    Priority: High   Impaired fasting glucose 05/23/2009    Priority: High   Long term current use of systemic steroids 03/30/2008    Priority: High   Polymyalgia rheumatica (HCC) 03/30/2008    Priority: High   Benign essential hypertension 05/05/2007    Priority: High   Persistent atrial fibrillation (HCC) 11/06/2006    Priority: High   Pure hypercholesterolemia 11/06/2006    Priority: High   Hypothyroidism due to Hashimoto's thyroiditis, Rx Levothyroxine 06/25/2005    Priority: High   Degenerative lumbar spinal stenosis 05/30/2022    Priority: Medium    Compression fracture of body of thoracic vertebra (HCC) 02/06/2021    Priority: Medium    Primary osteoarthritis of right knee 10/23/2019    Priority: Medium    Nonrheumatic mitral valve regurgitation 09/16/2015    Priority: Medium    Recurrent UTI 03/14/2015    Priority: Medium    Sensorineural hearing loss (SNHL) of both ears 01/14/2022    Priority: Low   Neuralgia, postherpetic 07/02/2019    Priority: Low   OAB (overactive bladder) 07/02/2019    Priority: Low   Constipation 01/18/2019    Priority: Low   Protein-calorie malnutrition, severe 11/06/2022   Frequent falls 05/17/2021   Current Meds  Medication Sig   acetaminophen (TYLENOL) 500 MG tablet Take 500 mg by mouth every  6 (six) hours as needed for mild pain or headache.   apixaban (ELIQUIS) 2.5 MG TABS tablet Take 1 tablet (2.5 mg total) by mouth 2 (two) times daily.   digoxin (LANOXIN) 0.125 MG tablet TAKE 1 TABLET BY MOUTH DAILY   dorzolamide-timolol (COSOPT) 22.3-6.8 MG/ML ophthalmic solution Place 1 drop into both eyes 2 (two) times daily.   latanoprost (XALATAN) 0.005 % ophthalmic solution Place 1 drop into both eyes at bedtime.   levothyroxine (SYNTHROID) 112 MCG tablet TAKE 1 TABLET BY MOUTH DAILY  BEFORE BREAKFAST   metoprolol succinate (TOPROL-XL) 50 MG 24 hr tablet TAKE 1 TABLET  BY MOUTH DAILY   predniSONE (DELTASONE) 1 MG tablet TAKE 1 TABLET BY MOUTH DAILY   spironolactone (ALDACTONE) 25 MG tablet Take 0.5 tablets (12.5 mg total) by mouth daily.    Allergies: Patient is allergic to shellfish-derived products, epinephrine, monosodium glutamate, monosodium glutamate, ciprofloxacin, dronedarone, erythromycin, sulfa antibiotics, sulfamethoxazole-trimethoprim, and tetracyclines & related. Family History: Patient family history includes Cirrhosis (age of onset: 27) in her father; Early death (age of onset: 71) in her mother; Heart disease in her mother; Hypertension in her brother, brother, father, son, and son; Lung cancer in her brother; Other in her brother; Rheumatic fever in her mother; Scleroderma in her daughter. Social History:  Patient  reports that she has quit smoking. Her smoking use included cigarettes. She has been exposed to tobacco smoke. She has never used smokeless tobacco. She reports that she does not currently use alcohol. She reports that she does not currently use drugs.  Review of Systems: Constitutional: Negative for fever malaise or anorexia Cardiovascular: negative for chest pain Respiratory: negative for SOB or persistent cough Gastrointestinal: negative for abdominal pain  OBJECTIVE Vitals: Ht 5\' 4"  (1.626 m)   Wt 114 lb (51.7 kg)   BMI 19.57 kg/m  General: no acute distress , A&Ox3 Right post thigh near glut: small open pressure sore w/o redness or drainage  Willow Ora, MD

## 2023-06-11 ENCOUNTER — Telehealth: Payer: Self-pay | Admitting: Neurology

## 2023-06-11 NOTE — Telephone Encounter (Signed)
I called and got the right fax number for [facility] orders and I will check with Mahina about the wheel chair and call her back.

## 2023-06-11 NOTE — Telephone Encounter (Signed)
Pt states that she is at [facility] and no one has called her about a power chair. She also wanted to know if Deanna Rivera got all of the information that she was needing from her

## 2023-06-11 NOTE — Telephone Encounter (Signed)
Called and let pt know that I faxed the orders per Dr Loleta Chance to [facility]. And we are waiting on the electric wheel chair .

## 2023-06-19 ENCOUNTER — Telehealth: Payer: Self-pay | Admitting: Neurology

## 2023-06-19 NOTE — Telephone Encounter (Signed)
Deanna Rivera with [facility] called in returning a call to [facility]

## 2023-06-20 ENCOUNTER — Ambulatory Visit: Payer: Medicare Other | Admitting: Family Medicine

## 2023-06-25 ENCOUNTER — Ambulatory Visit: Payer: Medicare Other | Admitting: Family Medicine

## 2023-06-26 ENCOUNTER — Ambulatory Visit: Payer: Medicare Other | Admitting: Family Medicine

## 2023-06-26 ENCOUNTER — Encounter: Payer: Self-pay | Admitting: Family Medicine

## 2023-06-26 VITALS — BP 130/80 | HR 61 | Temp 98.0°F | Ht 64.0 in | Wt 116.0 lb

## 2023-06-26 DIAGNOSIS — I10 Essential (primary) hypertension: Secondary | ICD-10-CM

## 2023-06-26 DIAGNOSIS — H6123 Impacted cerumen, bilateral: Secondary | ICD-10-CM

## 2023-06-26 DIAGNOSIS — I5032 Chronic diastolic (congestive) heart failure: Secondary | ICD-10-CM

## 2023-06-26 DIAGNOSIS — R609 Edema, unspecified: Secondary | ICD-10-CM

## 2023-06-26 DIAGNOSIS — I4819 Other persistent atrial fibrillation: Secondary | ICD-10-CM | POA: Diagnosis not present

## 2023-06-26 DIAGNOSIS — G7241 Inclusion body myositis [IBM]: Secondary | ICD-10-CM

## 2023-06-26 DIAGNOSIS — M79671 Pain in right foot: Secondary | ICD-10-CM

## 2023-06-26 DIAGNOSIS — I495 Sick sinus syndrome: Secondary | ICD-10-CM

## 2023-06-26 DIAGNOSIS — Z7952 Long term (current) use of systemic steroids: Secondary | ICD-10-CM | POA: Diagnosis not present

## 2023-06-26 NOTE — Progress Notes (Signed)
Subjective  CC:  Chief Complaint  Patient presents with   Foot Swelling    Pt stated that both her feet are hurting and swollen   Wound Check    On her leg and pt would like to have some labs drawn on her.    HPI: Deanna Rivera is a 89 y.o. female who presents to the office today to address the problems listed above in the chief complaint. 89 year old here for follow-up Reports sudden right-sided hearing loss 4 days ago.  No pain.  Wears hearing aids. Reviewed records from cardiology and rheumatology.  Chronic heart failure is stable.  Improving peripheral edema/dependent edema on Lasix.  Follow-up BMP showed normal potassium and renal function.  Continues on heart failure medications.  No chest pain or palpitations.  No shortness of breath. Mostly nonambulatory due to inclusion body myositis.  Reports bump to right foot on a wall while being pushed in her wheelchair and has some pain over the last 4 days.  No bruising. Continues on blood pressure medications. Dysphagia, tries to follow dysphagia protocol. Declines Prevnar 20 today  Assessment  1. Inclusion body myositis   2. Long term current use of systemic steroids   3. Dependent edema   4. Persistent atrial fibrillation (HCC)   5. SSS (sick sinus syndrome) (HCC)   6. Benign essential hypertension   7. Chronic heart failure with preserved ejection fraction (HCC)   8. Bilateral impacted cerumen   9. Right foot pain      Plan  S/p bilateral irrigation to remove cerumen.  Cerumen remained against the membrane on the right.  I manually used a curette to remove remainder of cerumen.  Upon complete removal, membrane was clear and patient was able to hear with her hearing aid again.  Other chronic problems are improved at this time.  No change in medications.  Reviewed all lab work.  Continue supportive care.  Patient did complete advanced directives and living will.  Recommend then bring a copy in for our chart.  Follow up: 6  months for recheck Visit date not found  No orders of the defined types were placed in this encounter.  No orders of the defined types were placed in this encounter.     I reviewed the patients updated PMH, FH, and SocHx.    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    Priority: High   Nontoxic multinodular goiter 09/08/2015    Priority: High   Current use of long term anticoagulation 05/16/2015    Priority: High   (HFpEF) heart failure with preserved ejection fraction (HCC) 09/13/2014    Priority: High   Pacemaker 11/09/2011    Priority: High   Impaired fasting glucose 05/23/2009    Priority: High   Long term current use of systemic steroids 03/30/2008    Priority: High   Polymyalgia rheumatica (HCC) 03/30/2008    Priority: High   Benign essential hypertension 05/05/2007    Priority: High   Persistent atrial fibrillation (HCC) 11/06/2006    Priority: High   Pure hypercholesterolemia 11/06/2006  Priority: High   Hypothyroidism due to Hashimoto's thyroiditis, Rx Levothyroxine 06/25/2005    Priority: High   Degenerative lumbar spinal stenosis 05/30/2022    Priority: Medium    Compression fracture of body of thoracic vertebra (HCC) 02/06/2021    Priority: Medium    Primary osteoarthritis of right knee 10/23/2019    Priority: Medium    Nonrheumatic mitral valve regurgitation 09/16/2015    Priority: Medium    Recurrent UTI 03/14/2015    Priority: Medium    Sensorineural hearing loss (SNHL) of both ears 01/14/2022    Priority: Low   Neuralgia, postherpetic  07/02/2019    Priority: Low   OAB (overactive bladder) 07/02/2019    Priority: Low   Constipation 01/18/2019    Priority: Low   Protein-calorie malnutrition, severe 11/06/2022   Frequent falls 05/17/2021   Current Meds  Medication Sig   acetaminophen (TYLENOL) 500 MG tablet Take 500 mg by mouth every 6 (six) hours as needed for mild pain or headache.   apixaban (ELIQUIS) 2.5 MG TABS tablet Take 1 tablet (2.5 mg total) by mouth 2 (two) times daily.   digoxin (LANOXIN) 0.125 MG tablet TAKE 1 TABLET BY MOUTH DAILY   dorzolamide-timolol (COSOPT) 22.3-6.8 MG/ML ophthalmic solution Place 1 drop into both eyes 2 (two) times daily.   furosemide (LASIX) 20 MG tablet Take 1 tablet (20 mg total) by mouth daily.   latanoprost (XALATAN) 0.005 % ophthalmic solution Place 1 drop into both eyes at bedtime.   levothyroxine (SYNTHROID) 112 MCG tablet TAKE 1 TABLET BY MOUTH DAILY  BEFORE BREAKFAST   metoprolol succinate (TOPROL-XL) 50 MG 24 hr tablet TAKE 1 TABLET BY MOUTH DAILY   polyethylene glycol (MIRALAX / GLYCOLAX) 17 g packet Take 17 g by mouth daily as needed for severe constipation.   predniSONE (DELTASONE) 1 MG tablet TAKE 1 TABLET BY MOUTH DAILY   spironolactone (ALDACTONE) 25 MG tablet Take 0.5 tablets (12.5 mg total) by mouth daily.    Allergies: Patient is allergic to shellfish-derived products, epinephrine, monosodium glutamate, monosodium glutamate, ciprofloxacin, dronedarone, erythromycin, sulfa antibiotics, sulfamethoxazole-trimethoprim, and tetracyclines & related. Family History: Patient family history includes Cirrhosis (age of onset: 75) in her father; Early death (age of onset: 19) in her mother; Heart disease in her mother; Hypertension in her brother, brother, father, son, and son; Lung cancer in her brother; Other in her brother; Rheumatic fever in her mother; Scleroderma in her daughter. Social History:  Patient  reports that she has quit smoking. Her smoking use included  cigarettes. She has been exposed to tobacco smoke. She has never used smokeless tobacco. She reports that she does not currently use alcohol. She reports that she does not currently use drugs.  Review of Systems: Constitutional: Negative for fever malaise or anorexia Cardiovascular: negative for chest pain Respiratory: negative for SOB or persistent cough Gastrointestinal: negative for abdominal pain  Objective  Vitals: BP 130/80   Pulse 61   Temp 98 F (36.7 C)   Ht 5\' 4"  (1.626 m)   Wt 116 lb (52.6 kg)   SpO2 99%   BMI 19.91 kg/m  General: no acute distress , A&Ox3, looks good today in wheelchair HEENT: PEERL, conjunctiva normal, neck is supple bilateral cerumen impaction noted, after irrigation and manual curettage, TMs are clear, hearing returned to baseline Cardiovascular:  RRR without murmur or gallop.  Respiratory:  Good breath sounds bilaterally, CTAB with normal respiratory effort Skin:  Warm, no rashes, removed Band-Aid from back of upper thigh, wound  is now healed. Lower extremities, peripheral edema and cold feet, tender fourth toe without bruising.  No swelling or warmth  Procedure: Cerumen Disimpaction Pt gave verbal consent to irrigation and manual cerumen impaction. The patient had a large amount of cerumen in the external auditory canal(s): bilateral Ear wax softener was not used prior to the lavage. Ear lavage was performed on bilateral ear(s) by Ninetta Lights, CMA Curettage by provider was performed in addition on right as noted above There were no complications and following the disimpaction the tympanic membrane was visible. Patient tolerated the procedure well.  Commons side effects, risks, benefits, and alternatives for medications and treatment plan prescribed today were discussed, and the patient expressed understanding of the given instructions. Patient is instructed to call or message via MyChart if he/she has any questions or concerns regarding our  treatment plan. No barriers to understanding were identified. We discussed Red Flag symptoms and signs in detail. Patient expressed understanding regarding what to do in case of urgent or emergency type symptoms.  Medication list was reconciled, printed and provided to the patient in AVS. Patient instructions and summary information was reviewed with the patient as documented in the AVS. This note was prepared with assistance of Dragon voice recognition software. Occasional wrong-word or sound-a-like substitutions may have occurred due to the inherent limitations of voice recognition software

## 2023-06-26 NOTE — Addendum Note (Signed)
Addended by: Asencion Partridge on: 06/26/2023 04:00 PM   Modules accepted: Level of Service

## 2023-07-06 ENCOUNTER — Other Ambulatory Visit: Payer: Self-pay | Admitting: Internal Medicine

## 2023-07-06 DIAGNOSIS — I4819 Other persistent atrial fibrillation: Secondary | ICD-10-CM

## 2023-07-08 NOTE — Telephone Encounter (Signed)
Prescription refill request for Eliquis received. Indication:afib Last office visit:9/24 Scr:0.30  9/24 Age: 89 Weight:52.6  kg  Prescription refilled

## 2023-07-10 NOTE — Telephone Encounter (Signed)
Called and let pt know that she was approved for power wheel chair. The rep was at the office today and told us this. She understood.

## 2023-07-17 ENCOUNTER — Other Ambulatory Visit: Payer: Self-pay | Admitting: Internal Medicine

## 2023-07-20 ENCOUNTER — Other Ambulatory Visit: Payer: Self-pay | Admitting: Family Medicine

## 2023-07-22 ENCOUNTER — Telehealth: Payer: Self-pay | Admitting: Neurology

## 2023-07-22 NOTE — Telephone Encounter (Signed)
Tobi Bastos called to let Dr.Hill know the patient Deanna Rivera demoed their wheelchair and she is interested in getting one. They are going to send over some paper work for Dr.Hill to fill out. If they could get it back asap

## 2023-07-22 NOTE — Telephone Encounter (Signed)
Called Deanna Rivera and paper will be filled out and sent back to her . Papers given to Dr. Loleta Chance.

## 2023-08-02 ENCOUNTER — Other Ambulatory Visit: Payer: Self-pay | Admitting: Family Medicine

## 2023-08-02 NOTE — Telephone Encounter (Signed)
Copied from CRM [id number]. Topic: Clinical - Medication Refill >> Aug 02, 2023  8:28 AM Desma Mcgregor wrote: Most Recent Primary Care Visit:  Provider: Willow Ora  Department: [facility]  Visit Type: OFFICE VISIT  Date: 06/26/2023  Medication: levothyroxine (SYNTHROID) 112 MCG tablet  Has the patient contacted their pharmacy? Yes (Agent: If no, request that the patient contact the pharmacy for the refill. If patient does not wish to contact the pharmacy document the reason why and proceed with request.) (Agent: If yes, when and what did the pharmacy advise?) Advised for patient to call dr  Is this the correct pharmacy for this prescription? Yes If no, delete pharmacy and type the correct one.  This is the patient's preferred pharmacy:  [facility] - [place], [place] - [address] [place] [address] Phone: [phone] Fax: [phone]   Has the prescription been filled recently?   Is the patient out of the medication? Yes  Has the patient been seen for an appointment in the last year OR does the patient have an upcoming appointment? Yes  Can we respond through MyChart? Yes  Agent: Please be advised that Rx refills may take up to 3 business days. We ask that you follow-up with your pharmacy.

## 2023-08-08 ENCOUNTER — Ambulatory Visit (INDEPENDENT_AMBULATORY_CARE_PROVIDER_SITE_OTHER): Payer: Medicare Other

## 2023-08-08 DIAGNOSIS — I442 Atrioventricular block, complete: Secondary | ICD-10-CM | POA: Diagnosis not present

## 2023-08-10 LAB — CUP PACEART REMOTE DEVICE CHECK
Battery Remaining Longevity: 138 mo
Battery Remaining Percentage: 100 %
Brady Statistic RA Percent Paced: 0 %
Brady Statistic RV Percent Paced: 69 %
Date Time Interrogation Session: 20241226064100
Implantable Lead Connection Status: 753985
Implantable Lead Connection Status: 753985
Implantable Lead Implant Date: 20130305
Implantable Lead Implant Date: 20130305
Implantable Lead Location: 753859
Implantable Lead Location: 753860
Implantable Lead Model: 4469
Implantable Lead Model: 4470
Implantable Lead Serial Number: 546229
Implantable Lead Serial Number: 712016
Implantable Pulse Generator Implant Date: 20200409
Lead Channel Impedance Value: 409 Ohm
Lead Channel Pacing Threshold Amplitude: 0.6 V
Lead Channel Pacing Threshold Pulse Width: 0.4 ms
Lead Channel Setting Pacing Amplitude: 1 V
Lead Channel Setting Pacing Pulse Width: 0.4 ms
Lead Channel Setting Sensing Sensitivity: 0.6 mV
Pulse Gen Serial Number: 859007
Zone Setting Status: 755011

## 2023-08-13 ENCOUNTER — Emergency Department (HOSPITAL_COMMUNITY): Payer: Medicare Other

## 2023-08-13 ENCOUNTER — Other Ambulatory Visit: Payer: Self-pay

## 2023-08-13 ENCOUNTER — Encounter (HOSPITAL_COMMUNITY): Payer: Self-pay

## 2023-08-13 ENCOUNTER — Emergency Department (HOSPITAL_COMMUNITY)
Admission: EM | Admit: 2023-08-13 | Discharge: 2023-08-14 | Disposition: A | Discharge status: Home / Self Care | Payer: Medicare Other | Attending: Emergency Medicine | Admitting: Emergency Medicine

## 2023-08-13 DIAGNOSIS — M25512 Pain in left shoulder: Secondary | ICD-10-CM

## 2023-08-13 DIAGNOSIS — I509 Heart failure, unspecified: Secondary | ICD-10-CM | POA: Diagnosis not present

## 2023-08-13 DIAGNOSIS — S20212A Contusion of left front wall of thorax, initial encounter: Secondary | ICD-10-CM

## 2023-08-13 DIAGNOSIS — R079 Chest pain, unspecified: Secondary | ICD-10-CM | POA: Diagnosis present

## 2023-08-13 DIAGNOSIS — M25562 Pain in left knee: Secondary | ICD-10-CM

## 2023-08-13 DIAGNOSIS — Z7901 Long term (current) use of anticoagulants: Secondary | ICD-10-CM | POA: Diagnosis not present

## 2023-08-13 DIAGNOSIS — W06XXXA Fall from bed, initial encounter: Secondary | ICD-10-CM | POA: Diagnosis not present

## 2023-08-13 DIAGNOSIS — I110 Hypertensive heart disease with heart failure: Secondary | ICD-10-CM | POA: Diagnosis not present

## 2023-08-13 DIAGNOSIS — S80212A Abrasion, left knee, initial encounter: Secondary | ICD-10-CM | POA: Insufficient documentation

## 2023-08-13 DIAGNOSIS — W19XXXA Unspecified fall, initial encounter: Secondary | ICD-10-CM

## 2023-08-13 DIAGNOSIS — E039 Hypothyroidism, unspecified: Secondary | ICD-10-CM | POA: Diagnosis not present

## 2023-08-13 DIAGNOSIS — M542 Cervicalgia: Secondary | ICD-10-CM | POA: Diagnosis not present

## 2023-08-13 DIAGNOSIS — Z950 Presence of cardiac pacemaker: Secondary | ICD-10-CM | POA: Diagnosis not present

## 2023-08-13 DIAGNOSIS — S2242XA Multiple fractures of ribs, left side, initial encounter for closed fracture: Secondary | ICD-10-CM | POA: Diagnosis not present

## 2023-08-13 DIAGNOSIS — M25552 Pain in left hip: Secondary | ICD-10-CM | POA: Insufficient documentation

## 2023-08-13 LAB — URINALYSIS, ROUTINE W REFLEX MICROSCOPIC
Bacteria, UA: NONE SEEN
Bilirubin Urine: NEGATIVE
Glucose, UA: NEGATIVE mg/dL
Hgb urine dipstick: NEGATIVE
Ketones, ur: NEGATIVE mg/dL
Leukocytes,Ua: NEGATIVE
Nitrite: POSITIVE — AB
Protein, ur: NEGATIVE mg/dL
Specific Gravity, Urine: 1.009 (ref 1.005–1.030)
pH: 8 (ref 5.0–8.0)

## 2023-08-13 LAB — I-STAT CHEM 8, ED
BUN: 19 mg/dL (ref 8–23)
Calcium, Ion: 1.3 mmol/L (ref 1.15–1.40)
Chloride: 102 mmol/L (ref 98–111)
Creatinine, Ser: 0.3 mg/dL — ABNORMAL LOW (ref 0.44–1.00)
Glucose, Bld: 75 mg/dL (ref 70–99)
HCT: 44 % (ref 36.0–46.0)
Hemoglobin: 15 g/dL (ref 12.0–15.0)
Potassium: 4.3 mmol/L (ref 3.5–5.1)
Sodium: 140 mmol/L (ref 135–145)
TCO2: 26 mmol/L (ref 22–32)

## 2023-08-13 LAB — TROPONIN I (HIGH SENSITIVITY): Troponin I (High Sensitivity): 19 ng/L — ABNORMAL HIGH (ref <18)

## 2023-08-13 MED ORDER — LIDOCAINE 5 % EX PTCH
1.0000 | MEDICATED_PATCH | CUTANEOUS | Status: DC
  Filled 2023-08-13: qty 1

## 2023-08-13 MED ORDER — ACETAMINOPHEN 500 MG PO TABS
1000.0000 mg | ORAL_TABLET | Freq: Once | ORAL | Status: AC
  Administered 2023-08-13: 1000 mg via ORAL
  Filled 2023-08-13: qty 2

## 2023-08-13 MED ORDER — LIDOCAINE 4 % EX PTCH: 1.0000 | MEDICATED_PATCH | CUTANEOUS | 0 refills | Status: DC

## 2023-08-13 MED ORDER — IOHEXOL 350 MG/ML SOLN
75.0000 mL | Freq: Once | INTRAVENOUS | Status: AC | PRN
  Administered 2023-08-13: 75 mL via INTRAVENOUS

## 2023-08-13 NOTE — ED Provider Notes (Signed)
 Physical Exam  BP 137/74 (BP Location: Left Arm)   Pulse 61   Temp 97.7 F (36.5 C) (Oral)   Resp 17   Ht 5' 4 (1.626 m)   Wt 50.8 kg   SpO2 96%   BMI 19.22 kg/m   Physical Exam Vitals and nursing note reviewed.  Constitutional:      General: She is not in acute distress.    Comments: thin  Eyes:     General: No scleral icterus. Cardiovascular:     Rate and Rhythm: Normal rate.  Pulmonary:     Effort: Pulmonary effort is normal. No respiratory distress.  Chest:     Chest wall: Tenderness present.       Comments: Tenderness to the marked area. No overlying skin changes noted. No step off or deformity.  Abdominal:     Tenderness: There is no abdominal tenderness. There is no guarding or rebound.  Musculoskeletal:       Arms:     Comments: Tenderness to palpation. No overlying skin changes noted. No step off or deformity.   Skin:    General: Skin is warm and dry.  Neurological:     Mental Status: She is alert.     Procedures  Procedures  ED Course / MDM    Medical Decision Making Amount and/or Complexity of Data Reviewed Labs: ordered. Radiology: ordered.  Risk OTC drugs. Prescription drug management.   Accepted handoff at shift change from [facility]. Please see prior provider note for more detail.   Briefly: Patient is 89 y.o. F presenting to the ER for evaluation of fall.  Patient fell out of bed this morning onto her left side.  Workup was concerning for some left-sided rib fractures.  Patient declining pain medication.  DDX: concern for rib fractures  Plan: Follow-up on imaging   X-ray of the left ribs shows Multiple left-sided rib fractures. No pneumothorax. Enlarged cardiac silhouette.  Per radiologist's interpretation.    Given the patient's age and amount of rib fractures. Will need to be admitted, but will need to obtain CT chest before admission. She is still declining pain medication.   CT Chest shows No evidence of pulmonary emboli.  Diffuse thyroid  enlargement stable from a prior exam as described. Given the patient's age, no follow-up recommended (ref: J Am Coll Radiol. 2015 Feb;12(2): 143-50). No evidence of rib fracture. Aortic Atherosclerosis (ICD10-I70.0) and Emphysema. Per radiologist's interpretation.    I called to confirm this read with the radiologist.  He thinks that there may have been some previously healed fractures but no acute fracture seen.  Given there is no fracture, it is likely contusion.  Patient's pain is controlled.  Vital signs show mildly elevated blood pressure otherwise unremarkable.  She was ambulatory with her previous staff.  Family and patient agree they feel stable going home.  Encourage patient to use incentive spirometer.  Will send her home with some lidocaine  patches.  Recommended Tylenol  as needed for pain.  Stable for discharge with close outpatient follow-up.  I discussed this case with my attending physician who cosigned this note including patient's presenting symptoms, physical exam, and planned diagnostics and interventions. Attending physician stated agreement with plan or made changes to plan which were implemented.   CT Chest W Contrast Result Date: 08/13/2023 CLINICAL DATA:  Recent fall with multiple rib fractures on recent plain film, initial encounter EXAM: CT CHEST WITH CONTRAST TECHNIQUE: Multidetector CT imaging of the chest was performed during intravenous contrast administration. RADIATION  DOSE REDUCTION: This exam was performed according to the departmental dose-optimization program which includes automated exposure control, adjustment of the mA and/or kV according to patient size and/or use of iterative reconstruction technique. CONTRAST:  75mL OMNIPAQUE  IOHEXOL  350 MG/ML SOLN COMPARISON:  Chest x-ray from earlier in the same day FINDINGS: Cardiovascular: Atherosclerotic calcifications of the thoracic aorta are noted. No aneurysmal dilatation is noted. Coronary calcifications are  seen. Heart is mildly enlarged in size. Pacing device is again seen. The pulmonary artery shows a normal branching pattern bilaterally. No intraluminal filling defect to suggest pulmonary embolism is noted. Mediastinum/Nodes: Thoracic inlet demonstrates diffuse thyroid  enlargement stable in appearance from a prior exam of January of 2024. No hilar or mediastinal adenopathy is noted. The esophagus as visualized is within normal limits. Lungs/Pleura: Emphysematous changes are noted with mild basilar scarring. No focal infiltrate or sizable effusion is noted. Upper Abdomen: Gallbladder has been surgically removed. Common bile duct is prominent consistent with the post cholecystectomy state. Remainder of the upper abdomen is within normal limits. Musculoskeletal: Degenerative changes of the thoracic spine are noted. No rib fractures are noted to correspond with findings on recent plain film. Chronic appearing T8 compression fracture is noted. IMPRESSION: No evidence of pulmonary emboli. Diffuse thyroid  enlargement stable from a prior exam as described. Given the patient's age, no follow-up recommended (ref: J Am Coll Radiol. 2015 Feb;12(2): 143-50). No evidence of rib fracture. Aortic Atherosclerosis (ICD10-I70.0) and Emphysema (ICD10-J43.9). Electronically Signed   By: Oneil Devonshire M.D.   On: 08/13/2023 21:13   DG Ribs Unilateral W/Chest Left Result Date: 08/13/2023 CLINICAL DATA:  Fall out of bed. EXAM: LEFT RIBS AND CHEST - 3 VIEW COMPARISON:  11/07/2022. FINDINGS: Nondisplaced fractures of the fourth through eighth ribs on the left and possibly the ninth and tenth ribs as well. Osseous structures appear otherwise intact. There is no evidence of pneumothorax or pleural effusion. Both lungs are clear. Cardiac silhouette appears prominent. There is a left-sided pacer. IMPRESSION: Multiple left-sided rib fractures. No pneumothorax. Enlarged cardiac silhouette. Electronically Signed   By: Fonda Field M.D.   On:  08/13/2023 17:11   DG Knee Complete 4 Views Left Result Date: 08/13/2023 CLINICAL DATA:  Fall.  Knee pain. EXAM: LEFT KNEE - COMPLETE 4+ VIEW COMPARISON:  Left knee radiographs dated May 11, 2022. FINDINGS: Diffuse osseous demineralization. No evidence of acute fracture or dislocation. Small joint effusion. Tricompartmental degenerative changes manifested by joint space narrowing and mild osteophytosis. Chondrocalcinosis is again noted. Prepatellar soft tissue swelling. IMPRESSION: 1. No acute osseous abnormality. 2. Mild-to-moderate degenerative changes of the left knee with chondrocalcinosis, which can be seen in the setting of CPPD arthropathy. 3. Small knee joint effusion. Electronically Signed   By: Harrietta Sherry M.D.   On: 08/13/2023 13:36   DG Humerus Left Result Date: 08/13/2023 CLINICAL DATA:  Fall.  Pain. EXAM: LEFT HUMERUS - 2+ VIEW COMPARISON:  None Available. FINDINGS: Diffuse osseous demineralization. There is no evidence of acute fracture or dislocation. Chondrocalcinosis of the radiocapitellar and ulnotrochlear articulations. Mild soft tissue swelling. IMPRESSION: No acute osseous abnormality. Electronically Signed   By: Harrietta Sherry M.D.   On: 08/13/2023 13:31   DG Shoulder Left Result Date: 08/13/2023 CLINICAL DATA:  Fall. EXAM: LEFT SHOULDER - 2+ VIEW COMPARISON:  CT thoracic spine dated July 11, 2022. FINDINGS: There is no evidence of acute fracture or dislocation. The glenohumeral joint is anatomically aligned with mild joint space narrowing. The acromioclavicular joint is anatomically aligned with mild-to-moderate degenerative  changes. Cortical irregularity noted at the posterior left fifth rib. Partially visualized and grossly unchanged chronic T8 compression deformity. Left chest wall cardiac device in place. Mild soft tissue swelling. IMPRESSION: 1. No acute fracture or dislocation of the left shoulder. 2. Cortical irregularity at the posterior left fifth rib is  concerning for possible fracture. Recommend correlation with history and point tenderness on physical exam. 3. Partially visualized and grossly unchanged chronic T8 compression deformity. 4. Mild-to-moderate arthritic changes of the acromioclavicular joint. Electronically Signed   By: Harrietta Sherry M.D.   On: 08/13/2023 13:29   DG Hip Unilat With Pelvis 2-3 Views Left Result Date: 08/13/2023 CLINICAL DATA:  Fall. EXAM: DG HIP (WITH OR WITHOUT PELVIS) 2-3V LEFT COMPARISON:  CT abdomen/pelvis dated November 04, 2022. FINDINGS: There is no evidence of acute hip fracture or dislocation. The sacroiliac joints and pubic symphysis appear anatomically aligned. Mild-to-moderate degenerative changes of the bilateral hips with superior joint space narrowing and marginal osteophytosis. Vascular calcifications are noted. IMPRESSION: 1. No acute osseous abnormality. 2. 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 Electronically Signed   By: Harrietta Sherry M.D.   On: 08/13/2023 13:19   CUP PACEART REMOTE DEVICE CHECK Result Date: 08/10/2023 Scheduled remote reviewed. Normal device function.  Next remote 91 days. - CS, CVRS          Bernis Ernst, PA-C 08/13/23 2151    Dean Clarity, MD 08/14/23 2144

## 2023-08-13 NOTE — Discharge Instructions (Addendum)
 Thank you for letting us  evaluate you today. Your imaging thankfully does not show any new fractures. You can use lidocaine  patches to the area and Tylenol  as needed for pain.   Using an incentive spirometer. An incentive spirometer is a small, plastic chamber attached to a mouthpiece. Many have one or more balls inside the chamber. To do this: Inhale quickly and deeply until the balls rise. Exhale slowly. The balls will drop to the bottom of the chamber. Repeat 10 times or as often as told by your health care provider.  Contact a health care provider if: Your pain gets worse. You develop a cough or wheezing. Get help right away if: You experience any of the following: Shortness of breath. Pain in your abdomen. Nausea or vomiting. A fever or chills. A rapid heart rate. You cough up blood. You feel dizzy or weak. You faint. You have severe chest pain. This may come with other symptoms, such as: Dizziness. Shortness of breath. Pain in your neck, jaw, or back, or in one arm or both arms. These symptoms may represent a serious problem that is an emergency. Do not wait to see if the symptoms will go away. Get medical help right away. Call your local emergency services (911 in the [place] Do not drive yourself to the hospital.

## 2023-08-13 NOTE — ED Notes (Signed)
Ptar called, 4th in line 

## 2023-08-13 NOTE — ED Provider Notes (Signed)
 [facility]ERGENC[facility] DEPARTMENT AT [facility] Provider Note   CSN: [id number] Arrival date & time: 08/13/23  1001     History  No chief complaint on file.   Deanna Rivera is a 89 y.o. female with a past medical history of heart failure, hypertension, AC (Eliquis ), hypothyroidism, pacemaker, pulmonary hypertension presents to emergency department following a fall out of bed onto her left side this morning.  She reports that she was leaning onto her left side when she leaned too far and half of her body went off of the bed.  She reports that she try to catch herself with her arm and was dangling and attempting to hold herself from falling over the railing with her left armpit. She currently complains of left hip pain, abrasion to left knee, left arm pain.  She denies head trauma, LOC, altered mentation, seizure-like activity.  Husband at bedside reports that patient has been acting appropriately without altered mentation, repetitive questioning, vomiting, nor seizures  The history is provided by the patient and the spouse. No language interpreter was used.     Home Medications Prior to Admission medications   Medication Sig Start Date End Date Taking? Authorizing Provider  acetaminophen  (TYLENOL ) 500 MG tablet Take 500 mg by mouth every 6 (six) hours as needed for mild pain or headache.    [provider]  digoxin  (LANOXIN ) 0.125 MG tablet TAKE 1 TABLET BY MOUTH DAILY 07/19/23   Deanna Danelle ORN, MD  dorzolamide-timolol (COSOPT) 22.3-6.8 MG/ML ophthalmic solution Place 1 drop into both eyes 2 (two) times daily. 03/30/21   [provider]  ELIQUIS  2.5 MG TABS tablet TAKE 1 TABLET BY MOUTH TWICE  DAILY 07/08/23   Deanna Danelle ORN, MD  furosemide  (LASIX ) 20 MG tablet Take 1 tablet (20 mg total) by mouth daily. 01/24/23 06/26/23  Sabharwal, Aditya, DO  latanoprost (XALATAN) 0.005 % ophthalmic solution Place 1 drop into both eyes at bedtime. 03/13/21   [provider]  levothyroxine  (SYNTHROID ) 112 MCG tablet TAKE 1 TABLET BY MOUTH DAILY  BEFORE BREAKFAST 08/02/23   Deanna Lavern CROME, MD  metoprolol  succinate (TOPROL -XL) 50 MG 24 hr tablet TAKE 1 TABLET BY MOUTH DAILY 07/22/23   Deanna Lavern CROME, MD  polyethylene glycol (MIRALAX  / GLYCOLAX ) 17 g packet Take 17 g by mouth daily as needed for severe constipation. 11/07/22   Bryn Bernardino NOVAK, MD  predniSONE  (DELTASONE ) 1 MG tablet TAKE 1 TABLET BY MOUTH DAILY 08/08/22   Deanna Lavern CROME, MD  spironolactone  (ALDACTONE ) 25 MG tablet Take 0.5 tablets (12.5 mg total) by mouth daily. 01/24/23   Sabharwal, Aditya, DO      Allergies    Shellfish-derived products, Epinephrine , Monosodium glutamate, Monosodium glutamate, Ciprofloxacin, Dronedarone, Erythromycin, Sulfa antibiotics, Sulfamethoxazole-trimethoprim, and Tetracyclines & related    Review of Systems   Review of Systems  Constitutional:  Negative for chills, fatigue and fever.  Respiratory:  Negative for cough, chest tightness, shortness of breath and wheezing.   Cardiovascular:  Negative for chest pain and palpitations.  Gastrointestinal:  Negative for abdominal pain, constipation, diarrhea, nausea and vomiting.  Musculoskeletal:  Positive for arthralgias.  Skin:  Positive for wound.  Neurological:  Negative for dizziness, seizures, weakness, light-headedness, numbness and headaches.    Physical Exam Updated Vital Signs BP 137/74 (BP Location: Left Arm)   Pulse 61   Temp 97.7 F (36.5 C) (Oral)   Resp 17   Ht 5' 4 (1.626 m)   Wt 50.8 kg  SpO2 96%   BMI 19.22 kg/m  Physical Exam Vitals and nursing note reviewed.  Constitutional:      General: She is not in acute distress.    Appearance: Normal appearance. She is not diaphoretic.  HENT:     Head: Normocephalic and atraumatic.     Comments: No hematoma nor TTP of cranium No crepitus to facial bones    Right Ear: External ear normal. No hemotympanum.     Left Ear: External ear normal.  No hemotympanum.     Nose: Nose normal.     Right Nostril: No epistaxis or septal hematoma.     Left Nostril: No epistaxis or septal hematoma.     Mouth/Throat:     Mouth: Mucous membranes are moist. No injury or lacerations.  Eyes:     General:        Right eye: No discharge.        Left eye: No discharge.     Extraocular Movements: Extraocular movements intact.     Conjunctiva/sclera: Conjunctivae normal.     Pupils: Pupils are equal, round, and reactive to light.     Comments: No subconjunctival hemorrhage, hyphema, tear drop pupil, or fluid leakage bilaterally  Neck:     Vascular: No carotid bruit.  Cardiovascular:     Rate and Rhythm: Normal rate.     Pulses: Normal pulses.          Radial pulses are 2+ on the right side and 2+ on the left side.       Dorsalis pedis pulses are 2+ on the right side and 2+ on the left side.  Pulmonary:     Effort: Pulmonary effort is normal. No respiratory distress.     Breath sounds: Normal breath sounds. No wheezing.  Chest:     Chest wall: No tenderness.  Abdominal:     General: Bowel sounds are normal. There is no distension.     Palpations: Abdomen is soft.     Tenderness: There is no abdominal tenderness. There is no guarding or rebound.  Musculoskeletal:     Cervical back: Full passive range of motion without pain, normal range of motion and neck supple. No deformity, rigidity or bony tenderness. Normal range of motion.     Thoracic back: No deformity or bony tenderness. Normal range of motion.     Lumbar back: No deformity or bony tenderness. Normal range of motion.     Right hip: No bony tenderness or crepitus.     Left hip: No bony tenderness or crepitus.     Right lower leg: No edema.     Left lower leg: No edema.     Comments: No obvious deformity to joints or long bones Pelvis stable with no shortening or rotation of LE bilaterally  Skin:    General: Skin is warm and dry.     Capillary Refill: Capillary refill takes less than  2 seconds.       Neurological:     General: No focal deficit present.     Mental Status: She is alert and oriented to person, place, and time. Mental status is at baseline.     GCS: GCS eye subscore is 4. GCS verbal subscore is 5. GCS motor subscore is 6.     Cranial Nerves: Cranial nerves 2-12 are intact. No cranial nerve deficit.     Sensory: Sensation is intact. No sensory deficit.     Motor: Motor function is intact. No weakness or tremor.  Coordination: Coordination is intact. Coordination normal. Finger-Nose-Finger Test and Heel to [facility] Test normal.     Gait: Gait is intact. Gait normal.     Deep Tendon Reflexes: Reflexes are normal and symmetric. Reflexes normal.     Comments: Acting following commands appropriately    ED Results / Procedures / Treatments   Labs (all labs ordered are listed, but only abnormal results are displayed) Labs Reviewed  URINALYSIS, ROUTINE W REFLEX MICROSCOPIC - Abnormal; Notable for the following components:      Result Value   APPearance HAZY (*)    Nitrite POSITIVE (*)    All other components within normal limits    EKG None  Radiology DG Knee Complete 4 Views Left Result Date: 08/13/2023 CLINICAL DATA:  Fall.  Knee pain. EXAM: LEFT KNEE - COMPLETE 4+ VIEW COMPARISON:  Left knee radiographs dated May 11, 2022. FINDINGS: Diffuse osseous demineralization. No evidence of acute fracture or dislocation. Small joint effusion. Tricompartmental degenerative changes manifested by joint space narrowing and mild osteophytosis. Chondrocalcinosis is again noted. Prepatellar soft tissue swelling. IMPRESSION: 1. No acute osseous abnormality. 2. Mild-to-moderate degenerative changes of the left knee with chondrocalcinosis, which can be seen in the setting of CPPD arthropathy. 3. Small knee joint effusion. Electronically Signed   By: Harrietta Sherry M.D.   On: 08/13/2023 13:36   DG Humerus Left Result Date: 08/13/2023 CLINICAL DATA:  Fall.  Pain.  EXAM: LEFT HUMERUS - 2+ VIEW COMPARISON:  None Available. FINDINGS: Diffuse osseous demineralization. There is no evidence of acute fracture or dislocation. Chondrocalcinosis of the radiocapitellar and ulnotrochlear articulations. Mild soft tissue swelling. IMPRESSION: No acute osseous abnormality. Electronically Signed   By: Harrietta Sherry M.D.   On: 08/13/2023 13:31   DG Shoulder Left Result Date: 08/13/2023 CLINICAL DATA:  Fall. EXAM: LEFT SHOULDER - 2+ VIEW COMPARISON:  CT thoracic spine dated July 11, 2022. FINDINGS: There is no evidence of acute fracture or dislocation. The glenohumeral joint is anatomically aligned with mild joint space narrowing. The acromioclavicular joint is anatomically aligned with mild-to-moderate degenerative changes. Cortical irregularity noted at the posterior left fifth rib. Partially visualized and grossly unchanged chronic T8 compression deformity. Left chest wall cardiac device in place. Mild soft tissue swelling. IMPRESSION: 1. No acute fracture or dislocation of the left shoulder. 2. Cortical irregularity at the posterior left fifth rib is concerning for possible fracture. Recommend correlation with history and point tenderness on physical exam. 3. Partially visualized and grossly unchanged chronic T8 compression deformity. 4. Mild-to-moderate arthritic changes of the acromioclavicular joint. Electronically Signed   By: Harrietta Sherry M.D.   On: 08/13/2023 13:29   DG Hip Unilat With Pelvis 2-3 Views Left Result Date: 08/13/2023 CLINICAL DATA:  Fall. EXAM: DG HIP (WITH OR WITHOUT PELVIS) 2-3V LEFT COMPARISON:  CT abdomen/pelvis dated November 04, 2022. FINDINGS: There is no evidence of acute hip fracture or dislocation. The sacroiliac joints and pubic symphysis appear anatomically aligned. Mild-to-moderate degenerative changes of the bilateral hips with superior joint space narrowing and marginal osteophytosis. Vascular calcifications are noted. IMPRESSION: 1. No  acute osseous abnormality. 2. 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 Electronically Signed   By: Harrietta Sherry M.D.   On: 08/13/2023 13:19    Procedures Procedures    Medications Ordered in ED Medications - No data to display  ED Course/ Medical Decision Making/ A&P  Medical Decision Making Amount and/or Complexity of Data Reviewed Labs: ordered. Radiology: ordered.   Patient presents to the ED for concern of left knee, left paraspinous neck, left chest, left hip pain following fall, this involves an extensive number of treatment options, and is a complaint that carries with it a high risk of complications and morbidity.  The differential diagnosis includes fracture, dislocation, contusion   Co morbidities that complicate the patient evaluation  heart failure, hypertension, AC (Eliquis ), hypothyroidism, pacemaker, pulmonary hypertension    Additional history obtained:  Additional history obtained from EMS, Family, Nursing, and Outside Medical Records   External records from outside source obtained and reviewed including  EMS and triage RN note Husband at bedside Recent medical evaluations and medication list    Imaging Studies ordered:  I ordered imaging studies including x-ray of knee, humerus, shoulder, pelvis I independently visualized and interpreted imaging which showed possible left fifth rib fracture I agree with the radiologist interpretation    Medicines ordered and prescription drug management:  I offered pain management to patient in ED however she preferred to not have analgesia at this time    Problem List / ED Course:  Fall, initial encounter X-ray significant for left fifth rib fracture.  Discussed findings with patient and patient's spouse Discussed importance of pulmonary toilet -  provided IS and educated patient on how to use it Urinary burning Patient currently has diaper on but reports that she is  mostly urinary continent UA negative for infection F/U with PCP   Reevaluation:  After the interventions noted above, I reevaluated the patient and found that they have :stayed the same   Social Determinants of Health:  Has PCP follow-up   Dispostion:  After consideration of the diagnostic results and the patients response to treatment, I feel that the patent would most likely benefit from outpatient management with PCP follow-up.  Discussed return to emergency department precautions with patient and patient's spouse expressed understanding agrees with plan.  All questions answered to their satisfaction.  They are agreeable to discharge if applicable. Sigh out to [place] PA pending final XR reading  Final Clinical Impression(s) / ED Diagnoses Final diagnoses:  Fall, initial encounter  Closed fracture of one rib of left side, initial encounter    Rx / DC Orders ED Discharge Orders     None        Minnie Tinnie BRAVO, PA 08/13/23 1611    Cleotilde Rogue, MD 08/14/23 [phone]

## 2023-08-13 NOTE — ED Triage Notes (Addendum)
Patient from [facility] assisted living and fell out of bed onto left side this am. Complains of left hip pain with abrasion to knee and left arm pain due to hanging on railing. No head trauma, no loc-takes eliquis. Alert and oriented

## 2023-08-26 ENCOUNTER — Ambulatory Visit: Payer: Self-pay | Admitting: Family Medicine

## 2023-08-26 NOTE — Telephone Encounter (Signed)
 Please call pt to schedule an appt with Dr. Mardelle Matte.

## 2023-08-26 NOTE — Telephone Encounter (Signed)
 Noted.

## 2023-08-26 NOTE — Telephone Encounter (Signed)
 Copied from CRM [id number]. Topic: Clinical - Red Word Triage >> Aug 26, 2023 12:43 PM Joanell B wrote: Red Word that prompted transfer to Nurse Triage: Pt stated that her right foot the toe next to pinky toe is infected and swollen, painful to the touch   Chief Complaint: calling back for appt time for infected toe triaged by other nurse today Disposition: [] ED /[] Urgent Care (no appt availability in office) / [] Appointment(In office/virtual)/ []  [facility]/ [] Home Care/ [] Refused Recommended Disposition /[] [facility]/ [x]  Follow-up with PCP Additional Notes: Pt spoke with another triage nurse earlier who recommended she follow up with her PCP, that triage nurse sent message to office as pt did not have her availability determined at the time of the phone call. Per pt chart, office recommended pt be seen with Dr. Jodie. Pt calling back requesting appt since she knows her availability now. Per pt chart, pt was scheduled for appt with Dr. Jodie on 1/15 at 11 am. Informed pt of this appt time, pt reporting schedule conflict, requesting reschedule for earlier appt. No earlier appt available, connected with CAL to see about other options, speaking with CAL now.   Reason for Disposition  Requesting regular office appointment  Answer Assessment - Initial Assessment Questions 1. REASON FOR CALL or QUESTION: What is your reason for calling today? or How can I best help you? or What question do you have that I can help answer?     Pt spoke with another triage nurse earlier who recommended she follow up with her PCP, that triage nurse sent message to office as pt did not have her availability determined at the time of the phone call. Per pt chart, office recommended pt be seen with Dr. Jodie. Pt calling back requesting appt since she knows her availability now.  Protocols used: Information Only Call - No Triage-A-AH

## 2023-08-26 NOTE — Telephone Encounter (Signed)
  Chief Complaint: toe pain Symptoms: right fourth toe pain Frequency: several days Pertinent Negatives: Patient denies fever, rash, leg pain Disposition: [] ED /[] Urgent Care (no appt availability in office) / [] Appointment(In office/virtual)/ []  [facility]/ [] Home Care/ [] Refused Recommended Disposition /[] [facility]/ [x]  Follow-up with PCP Additional Notes: patient c/o right fourth toe pain. Patient states she was told that her toe might be infected when she was getting her toes done at her independent living facility. Patient states her toe is swollen but doesn't notice any redness. Per protocol, patient to be seen within 3 days. Patient is to call back to make an appointment either on Tuesday, Wednesday or Thursday of this week. Patient states she has to check with her facility about transportation. She will call back to make an appointment. Patient verbalized understanding of plan. All questions answered.    Copied from CRM [id number]. Topic: Clinical - Red Word Triage >> Aug 26, 2023 11:54 AM Deanna Rivera wrote: Red Word that prompted transfer to Nurse Triage: Toe painful and swollen / also would like know why she gets very cold and cannot warm up Reason for Disposition  [1] MODERATE pain (e.g., interferes with normal activities, limping) AND [2] present > 3 days  Answer Assessment - Initial Assessment Questions 1. ONSET: When did the pain start?      A few days 2. LOCATION: Where is the pain located?   (e.g., around nail, entire toe, at foot joint)      Right foot-fourth toe 3. PAIN: How bad is the pain?    (Scale 1-10; or mild, moderate, severe)   -  MILD (1-3): doesn't interfere with normal activities    -  MODERATE (4-7): interferes with normal activities (e.g., work or school) or awakens from sleep, limping    -  SEVERE (8-10): excruciating pain, unable to do any normal activities, unable to walk     Moderate 4. APPEARANCE: What does the toe look like?  (e.g., redness, swelling, bruising, pallor)     swelling 5. CAUSE: What do you think is causing the toe pain?     Patient believes toe might be swollen 6. OTHER SYMPTOMS: Do you have any other symptoms? (e.g., leg pain, rash, fever, numbness)     No  Protocols used: Toe Pain-A-AH

## 2023-08-26 NOTE — Telephone Encounter (Signed)
Please call pt and schedule an appt

## 2023-08-28 ENCOUNTER — Ambulatory Visit: Payer: Medicare Other | Admitting: Family Medicine

## 2023-08-28 ENCOUNTER — Encounter: Payer: Self-pay | Admitting: Family Medicine

## 2023-08-28 VITALS — BP 138/82 | HR 69 | Temp 97.0°F | Ht 64.0 in | Wt 112.0 lb

## 2023-08-28 DIAGNOSIS — B353 Tinea pedis: Secondary | ICD-10-CM | POA: Diagnosis not present

## 2023-08-28 DIAGNOSIS — L84 Corns and callosities: Secondary | ICD-10-CM

## 2023-08-28 DIAGNOSIS — R600 Localized edema: Secondary | ICD-10-CM | POA: Diagnosis not present

## 2023-08-28 DIAGNOSIS — L97509 Non-pressure chronic ulcer of other part of unspecified foot with unspecified severity: Secondary | ICD-10-CM | POA: Diagnosis not present

## 2023-08-28 MED ORDER — TERBINAFINE HCL 250 MG PO TABS: 250.0000 mg | ORAL_TABLET | Freq: Every day | ORAL | 0 refills | Status: DC

## 2023-08-28 MED ORDER — CEPHALEXIN 500 MG PO CAPS: 500.0000 mg | ORAL_CAPSULE | Freq: Two times a day (BID) | ORAL | 0 refills | Status: DC

## 2023-08-28 NOTE — Progress Notes (Signed)
 Subjective  CC:  Chief Complaint  Patient presents with   Nail Problem    Pt c/o of right toe swelling since Friday, painful and light fungus seen by nail tech.     HPI: Deanna Rivera is a 89 y.o. female who presents to the office today to address the problems listed above in the chief complaint. Discussed the use of AI scribe software for clinical note transcription with the patient, who gave verbal consent to proceed.  History of Present Illness   The patient, with a history of chronic conditions, presents with a chief complaint of a painful, swollen, and reportedly infected toe. The discomfort has been ongoing for several weeks, with the patient acknowledging that the toe has "always" been painful. The pain has been significant enough to cause difficulty in wearing shoes. The patient was unaware of the severity of the condition until it was pointed out during a recent pedicure.  In addition to the toe issue, the patient also reports a recent fall in the hallway on New Year's Eve, resulting in a 15-hour stay in the hospital. The patient expresses concern about potential exposure to infections during the hospital stay.  Upon examination, the patient's feet, particularly between the toes, are found to be moist and flaky, suggestive of a fungal infection, possibly athlete's foot. The patient denies wearing shoes all the time and reports wearing socks instead. The patient also has a healing wound from the recent fall, indicating the ability to heal.  The patient expresses reluctance to take additional oral medications due to concerns about stomach upset and difficulty with fluid balance related to a water pill currently being taken.       Assessment  1. Tinea pedis of both feet   2. Callus between toes   3. Sore on toe (HCC)   4. Bilateral lower extremity edema      Plan  Assessment and Plan    Tinea Pedis Symptoms include moist and flaking skin between the toes, ongoing for weeks  on both feet. Discussed importance of drying the area to prevent complications. Risks of untreated fungal infection include worsening skin condition and potential bacterial infection. Benefits of treatment include symptom relief and prevention of complications. Patient prefers topical treatment due to concerns about gastrointestinal side effects of oral antifungals. - Recommend Lamisil  cream (OTC) twice daily, pt defers oral lamisil  at this time.  - Advise daily soaks with Epsom salt, warm water with apple cider vinegar, or baking soda for 3-4 days - Instruct to dry feet thoroughly after soaking  Localized Skin Infection Sore on the toe at risk of bacterial infection. No significant bacterial skin infection currently, but prevention is necessary. Risks of untreated infection include progression to a more severe infection. Benefits of antibiotic treatment include prevention of infection spread. Antibiotic coverage preferred over monitoring due to current sore. - Prescribe Keflex  (cephalexin ) for one week - Monitor sore for signs of worsening infection  General Health Maintenance Advised on general foot care to prevent complications from tinea pedis and potential bacterial infections. Discussed importance of breathable socks and allowing feet to air out. - Advise wearing breathable socks and allowing feet to air out - Recommend using a container for foot soaks if not available at home.       Meds ordered this encounter  Medications   DISCONTD: terbinafine  (LAMISIL ) 250 MG tablet    Sig: Take 1 tablet (250 mg total) by mouth daily.    Dispense:  14 tablet  Refill:  0   cephALEXin  (KEFLEX ) 500 MG capsule    Sig: Take 1 capsule (500 mg total) by mouth 2 (two) times daily.    Dispense:  14 capsule    Refill:  0     I reviewed the patients updated PMH, FH, and SocHx.    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    Priority: High   Nontoxic multinodular goiter 09/08/2015    Priority: High   Current use of long term anticoagulation 05/16/2015    Priority: High   (HFpEF) heart failure with preserved ejection fraction (HCC) 09/13/2014    Priority: High   Pacemaker 11/09/2011    Priority: High   Impaired fasting glucose 05/23/2009    Priority: High   Long term current use of systemic steroids 03/30/2008    Priority: High   Polymyalgia rheumatica (HCC) 03/30/2008    Priority: High   Benign essential hypertension 05/05/2007    Priority: High   Persistent atrial fibrillation (HCC) 11/06/2006    Priority: High   Pure hypercholesterolemia 11/06/2006    Priority: High   Hypothyroidism due to Hashimoto's thyroiditis, Rx Levothyroxine  06/25/2005    Priority: High   Degenerative lumbar spinal stenosis 05/30/2022    Priority: Medium    Compression fracture of body of thoracic vertebra (HCC) 02/06/2021    Priority: Medium    Primary osteoarthritis of right knee 10/23/2019    Priority: Medium    Nonrheumatic mitral valve regurgitation 09/16/2015    Priority: Medium    Recurrent UTI 03/14/2015    Priority: Medium    Sensorineural hearing loss (SNHL) of both ears 01/14/2022    Priority: Low   Neuralgia, postherpetic 07/02/2019    Priority: Low   OAB (overactive bladder) 07/02/2019    Priority: Low   Constipation 01/18/2019    Priority: Low   Protein-calorie malnutrition, severe 11/06/2022   Frequent falls 05/17/2021   Current Meds  Medication Sig   acetaminophen  (TYLENOL ) 500 MG tablet Take 500 mg by mouth every 6 (six) hours as needed for mild pain or  headache.   cephALEXin  (KEFLEX ) 500 MG capsule Take 1 capsule (500 mg total) by mouth 2 (two) times daily.   digoxin  (LANOXIN ) 0.125 MG tablet TAKE 1 TABLET BY MOUTH DAILY   dorzolamide-timolol (COSOPT) 22.3-6.8 MG/ML ophthalmic solution Place 1 drop into both eyes 2 (two) times daily.   ELIQUIS  2.5 MG TABS tablet TAKE 1 TABLET BY MOUTH TWICE  DAILY   latanoprost (XALATAN) 0.005 % ophthalmic solution Place 1 drop into both eyes at bedtime.   levothyroxine  (SYNTHROID ) 112 MCG tablet TAKE 1 TABLET BY MOUTH DAILY  BEFORE BREAKFAST   lidocaine  (HM LIDOCAINE  PATCH) 4 % Place 1 patch onto the skin daily.   metoprolol  succinate (TOPROL -XL) 50 MG 24 hr tablet TAKE 1 TABLET BY MOUTH DAILY   polyethylene glycol (MIRALAX  / GLYCOLAX ) 17 g packet Take 17 g by mouth daily as needed for severe constipation.   predniSONE  (DELTASONE ) 1 MG tablet TAKE 1 TABLET BY MOUTH DAILY   spironolactone  (ALDACTONE ) 25 MG tablet  Take 0.5 tablets (12.5 mg total) by mouth daily.   [DISCONTINUED] terbinafine  (LAMISIL ) 250 MG tablet Take 1 tablet (250 mg total) by mouth daily.    Allergies: Patient is allergic to shellfish-derived products, epinephrine , monosodium glutamate, monosodium glutamate, ciprofloxacin, dronedarone, erythromycin, sulfa antibiotics, sulfamethoxazole-trimethoprim, and tetracyclines & related. Family History: Patient family history includes Cirrhosis (age of onset: 25) in her father; Early death (age of onset: 72) in her mother; Heart disease in her mother; Hypertension in her brother, brother, father, son, and son; Lung cancer in her brother; Other in her brother; Rheumatic fever in her mother; Scleroderma in her daughter. Social History:  Patient  reports that she has quit smoking. Her smoking use included cigarettes. She has been exposed to tobacco smoke. She has never used smokeless tobacco. She reports that she does not currently use alcohol. She reports that she does not currently use drugs.  Review  of Systems: Constitutional: Negative for fever malaise or anorexia Cardiovascular: negative for chest pain Respiratory: negative for SOB or persistent cough Gastrointestinal: negative for abdominal pain  Objective  Vitals: BP 138/82   Pulse 69   Temp (!) 97 F (36.1 C)   Ht 5\' 4"  (1.626 m)   Wt 112 lb (50.8 kg)   SpO2 98%   BMI 19.22 kg/m  General: no acute distress , A&Ox3, in wheel chair. Non-ambulatory HEENT: PEERL, conjunctiva normal, neck is supple Bilateral LE with edema Bilateral flaking and maceration between toes, between 4th and 5th on right, scabbed sore on lateral 4th toe.   Commons side effects, risks, benefits, and alternatives for medications and treatment plan prescribed today were discussed, and the patient expressed understanding of the given instructions. Patient is instructed to call or message via MyChart if he/she has any questions or concerns regarding our treatment plan. No barriers to understanding were identified. We discussed Red Flag symptoms and signs in detail. Patient expressed understanding regarding what to do in case of urgent or emergency type symptoms.  Medication list was reconciled, printed and provided to the patient in AVS. Patient instructions and summary information was reviewed with the patient as documented in the AVS. This note was prepared with assistance of Dragon voice recognition software. Occasional wrong-word or sound-a-like substitutions may have occurred due to the inherent limitations of voice recognition software

## 2023-08-28 NOTE — Patient Instructions (Signed)
 VISIT SUMMARY:  During today's visit, we addressed your painful and swollen toe, which has been causing you significant discomfort for several weeks. We also discussed your recent fall and hospital stay, as well as your concerns about potential infections. Upon examination, we identified a fungal infection between your toes and a sore on your toe that requires attention to prevent bacterial infection.  YOUR PLAN:  -TINEA PEDIS (ATHLETE'S FOOT): Tinea pedis, commonly known as athlete's foot, is a fungal infection that causes moist and flaking skin between the toes. To treat this, you should use Lamisil  cream (available over-the-counter) twice daily for 2 weeks. Additionally, soak your feet daily with Epsom salt, warm water with apple cider vinegar, or baking soda for 3-4 days, and make sure to dry your feet thoroughly after soaking.  -LOCALIZED SKIN INFECTION: You have a sore on your toe that is at risk of developing a bacterial infection. To prevent this, we have prescribed Keflex  (cephalexin ) for one week. Please monitor the sore for any signs of worsening infection.  -GENERAL HEALTH MAINTENANCE: We discussed the importance of general foot care to prevent complications from athlete's foot and potential bacterial infections. You should wear breathable socks and allow your feet to air out regularly. If you do not have a container for foot soaks at home, we recommend getting one.  INSTRUCTIONS:  Please follow the treatment plan for your fungal infection and sore on your toe. Use Lamisil  cream twice daily and soak your feet as advised. Take the prescribed Keflex  for one week and monitor the sore for any signs of worsening infection. Ensure you wear breathable socks and allow your feet to air out. If you have any concerns or notice any changes, please schedule a follow-up appointment.

## 2023-09-05 ENCOUNTER — Other Ambulatory Visit: Payer: Self-pay

## 2023-09-05 ENCOUNTER — Telehealth: Payer: Self-pay

## 2023-09-05 DIAGNOSIS — B353 Tinea pedis: Secondary | ICD-10-CM

## 2023-09-05 NOTE — Telephone Encounter (Signed)
Copied from CRM [id number]. Topic: Referral - Request for Referral >> Sep 05, 2023  9:07 AM Truddie Crumble wrote: Did the patient discuss referral with their provider in the last year? Yes (If No - schedule appointment) (If Yes - send message)  Appointment offered? No  Type of order/referral and detailed reason for visit: podiatrist for toe Pt stated she is not doing what the doctor recommended for her toe because she can not  Preference of office, provider, location: [place]  If referral order, have you been seen by this specialty before? No (If Yes, this issue or another issue? When? Where?  Can we respond through MyChart? Yes  Please advise on this referral

## 2023-09-06 NOTE — Telephone Encounter (Signed)
Noted

## 2023-09-11 ENCOUNTER — Ambulatory Visit (INDEPENDENT_AMBULATORY_CARE_PROVIDER_SITE_OTHER): Payer: Medicare Other | Admitting: Podiatry

## 2023-09-11 ENCOUNTER — Encounter: Payer: Self-pay | Admitting: Podiatry

## 2023-09-11 DIAGNOSIS — D689 Coagulation defect, unspecified: Secondary | ICD-10-CM | POA: Diagnosis not present

## 2023-09-11 DIAGNOSIS — I999 Unspecified disorder of circulatory system: Secondary | ICD-10-CM | POA: Diagnosis not present

## 2023-09-11 DIAGNOSIS — M2041 Other hammer toe(s) (acquired), right foot: Secondary | ICD-10-CM | POA: Diagnosis not present

## 2023-09-11 DIAGNOSIS — L84 Corns and callosities: Secondary | ICD-10-CM | POA: Diagnosis not present

## 2023-09-11 NOTE — Progress Notes (Signed)
Subjective:   Patient ID: Deanna Rivera, female   DOB: 89 y.o.   MRN: [id number]   HPI Patient presents with caregiver in wheelchair with a painful area fourth digit right foot with discoloration of the toes that are occurring and patient who has not been able to be ambulatory with poor health history.  Does not smoke and tries to be active as best of her ability   Review of Systems  All other systems reviewed and are negative.       Objective:  Physical Exam Vitals and nursing note reviewed.  Constitutional:      Appearance: She is well-developed.  Pulmonary:     Effort: Pulmonary effort is normal.  Musculoskeletal:        General: Normal range of motion.  Skin:    General: Skin is warm.  Neurological:     Mental Status: She is alert.   Vascular status diminished with multiple signs of venous disease lymphatic disease with nonweightbearing wheelchair being a big issue for her patient does have a keratotic lesion on the lateral side of the fourth digit right that has been painful with swelling occurring in the toe but no indications of proximal edema erythema drainage      Assessment:  Digital deformity digit for right keratotic lesion with venous and vascular disease which is a complicating factor along with blood thinner     Plan:  H&P reviewed with her and caregiver.  This is a difficult for 1 to deal with but today I went ahead I debrided the lesion without noticing any pus or drainage and I advised on soaks I applied cushioning to the toe to offload weight and I explained to her caregiver how to do this and hopefully this will solve the problem.  If any issues were to occur patient is to let us know immediately

## 2023-09-25 ENCOUNTER — Other Ambulatory Visit: Payer: Self-pay | Admitting: Family Medicine

## 2023-09-25 MED ORDER — PREDNISONE 1 MG PO TABS: 1.0000 mg | ORAL_TABLET | Freq: Every day | ORAL | 3 refills | Status: DC

## 2023-09-25 NOTE — Telephone Encounter (Signed)
08/28/2023 LOV  08/08/2022 last fill date  90/3 refills

## 2023-09-25 NOTE — Telephone Encounter (Signed)
Copied from CRM [id number]. Topic: Clinical - Medication Refill >> Sep 25, 2023 10:05 AM Dimitri Ped wrote: Most Recent Primary Care Visit:  Provider: Willow Ora  Department: [facility]  Visit Type: OFFICE VISIT  Date: 08/28/2023  Medication: predniSONE (DELTASONE) 1 MG tablet   Has the patient contacted their pharmacy? Yes dr has not responded to request (Agent: If no, request that the patient contact the pharmacy for the refill. If patient does not wish to contact the pharmacy document the reason why and proceed with request.) (Agent: If yes, when and what did the pharmacy advise?)  Is this the correct pharmacy for this prescription? Yes If no, delete pharmacy and type the correct one.  This is the patient's preferred pharmacy:  [facility] - [place], [place] - [address] [place] [address] Phone: [phone] Fax: [phone]  [id number] - [place], [place] - [address] [place] [address] Phone: [phone] Fax: [phone]   Has the prescription been filled recently? No  Is the patient out of the medication? No will be if they dont approve today   Has the patient been seen for an appointment in the last year OR does the patient have an upcoming appointment? Yes  Can we respond through MyChart? No  Agent: Please be advised that Rx refills may take up to 3 business days. We ask that you follow-up with your pharmacy.

## 2023-09-25 NOTE — Telephone Encounter (Signed)
Last Fill: 08/08/22  Last OV: 08/28/23  Next OV: 12/24/23  Routing to provider for review/authorization.

## 2023-10-10 ENCOUNTER — Ambulatory Visit: Payer: Self-pay | Admitting: Family Medicine

## 2023-10-10 NOTE — Telephone Encounter (Signed)
 Patient called, left VM to return the call to the office to speak to the NT.    Copied From CRM [id number]. Reason for Triage: Blood pressure has been going up and down.

## 2023-10-15 ENCOUNTER — Ambulatory Visit: Payer: Self-pay | Admitting: Family Medicine

## 2023-10-15 NOTE — Telephone Encounter (Signed)
 Copied from CRM [id number]. Topic: Clinical - Red Word Triage >> Oct 15, 2023 10:11 AM Presley Raddle C wrote: Red Word that prompted transfer to Nurse Triage: low blood pressure, 90-80/60, on going for 2 weeks.   Chief Complaint: low blood pressure Symptoms: tired, "just don't feel good," 10 min nosebleed yesterday Frequency: intermittent, continual Pertinent Negatives: Patient denies chest pain, SOB, abdominal pain, fever, dizziness, weakness, lightheadedness Disposition: [] 911 / [] ED /[] Urgent Care (no appt availability in office) / [] Appointment(In office/virtual)/ []  [facility]/ [] Home Care/ [x] Refused Recommended Disposition /[] [facility]/ []  Follow-up with PCP Additional Notes: Pt reporting low BP for the past 2 weeks, been having to skip her metoprolol for her low BP, then sometimes high BP since no med, pulse staying consistent in 60s. Pt reporting yesterday had nosebleed that stopped bleeding after 10 min, fatigue, denies weakness/dizziness. Pt confirms urinating every 8 hours, drinking fluids, has dry mouth with "swallowing problem" but not consistently and not worse than normal lately. Pt BP earlier this morning was 90/70, pt took BP again per nurse request to reading of 115/83, HR 67. Advised be examined in next 4 hours, pt not wanting to see doc other than Dr. Mardelle Matte, connected to CAL, CAL stayed on for remainder of phone call with pt. Pt reporting they're unable to make appt CAL offered today. Advised UC or ED, refusing at this time, advised 911 due to transportation issues especially if BP low again and/or more fatigue or new symptoms. Pt reporting that she "can't convince" her husband that her BP is abnormal, since some readings are technically within normal limits. Advised strongly that pt be examined and/or call 911 if low BP again or worsening. Pt verbalized understanding. Please advise if further recommendations at this time.  Reason for Disposition  [1] Systolic BP < 90  AND [2] NOT dizzy, lightheaded or weak  Answer Assessment - Initial Assessment Questions 1. BLOOD PRESSURE: "What is the blood pressure?" "Did you take at least two measurements 5 minutes apart?"     9 am 90/70, 10:21 am 115/83, HR 67 2. ONSET: "When did you take your blood pressure?"     Last 2 weeks 3. HOW: "How did you obtain the blood pressure?" (e.g., visiting nurse, automatic home BP monitor)     At-home monitor 4. HISTORY: "Do you have a history of low blood pressure?" "What is your blood pressure normally?"     They've been 80/60, been taking HTN meds, then could hardly function 5. MEDICINES: "Are you taking any medications for blood pressure?" If Yes, ask: "Have they been changed recently?"     Metoprolol, stopped taking metoprolol for now, checking BP consistently now to see if can take med which never had to do before, sometimes forget to check BP and goes up to 170 systolic 6. PULSE RATE: "Do you know what your pulse rate is?"      Heart rate stays around 60 bpm because I have a pacemaker 7. OTHER SYMPTOMS: "Have you been sick recently?" "Have you had a recent injury?"     tired  Protocols used: Blood Pressure - Low-A-AH

## 2023-10-18 ENCOUNTER — Ambulatory Visit: Admitting: Family Medicine

## 2023-10-31 ENCOUNTER — Ambulatory Visit: Payer: Self-pay

## 2023-10-31 NOTE — Telephone Encounter (Signed)
 Chief Complaint: fluctuating BP Symptoms: low BP  Frequency: about a month Pertinent Negatives: Patient denies sob, chest pain Disposition: [] ED /[] Urgent Care (no appt availability in office) / [x] Appointment(In office/virtual)/ []  [facility]/ [] Home Care/ [] Refused Recommended Disposition /[]  Mobile Bus/ [x]  Follow-up with PCP Additional Notes: Patient states that her BP has been fluctuating. She states that in the morning it is low and and then goes up throughout the day. She is worried about the metoprolol. Patient states she has been worried about her husbands health and just not felling well herself. She has tried to get in for appt before but because of conflicts with her husbands appts can't. Tried to schedule for tomorrow but her husband has an appt tomorrow so scheduled her for virtual on Monday.  Encouraged to go to ED if symptoms become worse.    Copied from CRM [id number]. Topic: Clinical - Red Word Triage >> Oct 31, 2023  4:01 PM [place] E wrote: [facility] that prompted transfer to Nurse Triage: Fluctuating blood pressure. Patient is worried about her fluctuation in blood pressure. This morning was 90/63 and just now it was 119/72. Reason for Disposition  Brief (now gone) dizziness or lightheadedness after standing up or eating  Answer Assessment - Initial Assessment Questions 1. BLOOD PRESSURE: "What is the blood pressure?" "Did you take at least two measurements 5 minutes apart?"     yes 2. ONSET: "When did you take your blood pressure?"     This morning 3. HOW: "How did you obtain the blood pressure?" (e.g., visiting nurse, automatic home BP monitor)     Home BP cuff 4. HISTORY: "Do you have a history of low blood pressure?" "What is your blood pressure normally?"     no 5. MEDICINES: "Are you taking any medications for blood pressure?" If Yes, ask: "Have they been changed recently?"     metoprolol 6. PULSE RATE: "Do you know what your pulse rate is?"      60-80 has pacemaker 7. OTHER SYMPTOMS: "Have you been sick recently?" "Have you had a recent injury?"     No but have been worried about husband.  Protocols used: Blood Pressure - Low-A-AH

## 2023-11-04 ENCOUNTER — Encounter: Payer: Self-pay | Admitting: Family Medicine

## 2023-11-04 ENCOUNTER — Telehealth (INDEPENDENT_AMBULATORY_CARE_PROVIDER_SITE_OTHER): Admitting: Family Medicine

## 2023-11-04 VITALS — BP 95/70 | Ht 65.0 in | Wt 113.0 lb

## 2023-11-04 DIAGNOSIS — I5032 Chronic diastolic (congestive) heart failure: Secondary | ICD-10-CM

## 2023-11-04 DIAGNOSIS — I10 Essential (primary) hypertension: Secondary | ICD-10-CM | POA: Diagnosis not present

## 2023-11-04 DIAGNOSIS — I4819 Other persistent atrial fibrillation: Secondary | ICD-10-CM | POA: Diagnosis not present

## 2023-11-04 DIAGNOSIS — E43 Unspecified severe protein-calorie malnutrition: Secondary | ICD-10-CM

## 2023-11-04 NOTE — Progress Notes (Signed)
 Virtual Visit via Video Note  Subjective  CC:  Chief Complaint  Patient presents with   low bp    Pt stated that she has been experiencing some low and high bp. Low 94/54 and the highest 179/118. Pt stated that she has not been taking her metoprolol like she should bc of the up/down bp.      I connected with Aleeha Boline on 11/04/23 at 11:00 AM EDT by a video enabled telemedicine application and verified that I am speaking with the correct person using two identifiers. Location patient: Home Location provider: [facility] [address] Office Persons participating in the virtual visit: Deanna Rivera, Deanna Ora, MD Trudie Reed CMA  I discussed the limitations of evaluation and management by telemedicine and the availability of in person appointments. The patient expressed understanding and agreed to proceed. HPI: Deanna Rivera is a 89 y.o. female who was contacted today to address the problems listed above in the chief complaint. Discussed the use of AI scribe software for clinical note transcription with the patient, who gave verbal consent to proceed.  History of Present Illness   Deanna Rivera is an 89 year old female with hypertension who presents with concerns about blood pressure fluctuations.  She experiences significant fluctuations in her blood pressure over the past few months. Her blood pressure is often very low in the mornings, with readings such as 94/70 mmHg, leading her to hesitate in taking her metoprolol. She describes an episode where her blood pressure was extremely low, prompting her to skip her medication. By the evening, her blood pressure can become significantly elevated, with a recent reading of 179/119 mmHg.  She has been adjusting her metoprolol dosage based on her blood pressure readings. She takes half a pill when her blood pressure is around 120/80 mmHg and has taken up to three halves in a day to manage high readings. She  is concerned about the variability and the potential consequences of both high and low blood pressure.  She mentions experiencing stomach pains and is concerned about the possibility of having the flu. She maintains her weight and is not experiencing significant swelling, although she is taking diuretics. Her husband suggests that her stomach issues might be related to taking diuretics on an empty stomach.  She shares that her husband has cancer, which has returned, and this has been a source of stress. She is apprehensive about the idea of moving to assisted living and prefers to remain in her current living situation with her husband.     Reviewed last labs and cards eval: perm afib, and chronic heart failure.  Assessment  1. Benign essential hypertension   2. Chronic heart failure with preserved ejection fraction (HCC)   3. Persistent atrial fibrillation (HCC)   4. Protein-calorie malnutrition, severe      Plan  Assessment and Plan    Hypertension/labile Significant blood pressure fluctuations with morning hypotension and evening severe hypertension. Current metoprolol regimen inadequate. Adjustments needed to prevent hypotension and manage hypertension. - Instruct to take half a pill of metoprolol at dinnertime daily. 25mg  daily at dinner time.  - Allow an additional half pill if evening blood pressure is significantly elevated (diastolic over 120).  Palliative Care Needs Difficulty managing health at home. Palliative care can provide necessary support and resources. - Order a palliative care consult to provide home health support and monitoring.  Follow-up Follow-up scheduled in May. Emphasized importance of monitoring and treatment adjustments. -  Perform blood work at the next appointment in May. - Advise to come in sooner if condition worsens.       I discussed the assessment and treatment plan with the patient. The patient was provided an opportunity to ask questions and all  were answered. The patient agreed with the plan and demonstrated an understanding of the instructions.   The patient was advised to call back or seek an in-person evaluation if the symptoms worsen or if the condition fails to improve as anticipated. Follow up: in person with labs 12/24/2023  No orders of the defined types were placed in this encounter.     I reviewed the patients updated PMH, FH, and SocHx.    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    Priority: High   Nontoxic multinodular goiter 09/08/2015    Priority: High   Current use of long term anticoagulation 05/16/2015    Priority: High   (HFpEF) heart failure with preserved ejection fraction (HCC) 09/13/2014    Priority: High   Pacemaker 11/09/2011    Priority: High   Impaired fasting glucose 05/23/2009    Priority: High   Long term current use of systemic steroids 03/30/2008    Priority: High   Polymyalgia rheumatica (HCC) 03/30/2008    Priority: High   Benign essential hypertension 05/05/2007    Priority: High   Persistent atrial fibrillation (HCC) 11/06/2006    Priority: High   Pure hypercholesterolemia 11/06/2006    Priority: High   Hypothyroidism due to Hashimoto's thyroiditis, Rx Levothyroxine 06/25/2005    Priority: High   Degenerative lumbar spinal stenosis 05/30/2022    Priority: Medium    Compression fracture of body of thoracic vertebra (HCC) 02/06/2021    Priority: Medium    Primary osteoarthritis of right knee 10/23/2019    Priority: Medium    Nonrheumatic mitral valve regurgitation  09/16/2015    Priority: Medium    Recurrent UTI 03/14/2015    Priority: Medium    Sensorineural hearing loss (SNHL) of both ears 01/14/2022    Priority: Low   Neuralgia, postherpetic 07/02/2019    Priority: Low   OAB (overactive bladder) 07/02/2019    Priority: Low   Constipation 01/18/2019    Priority: Low   Protein-calorie malnutrition, severe 11/06/2022   Frequent falls 05/17/2021   No outpatient medications have been marked as taking for the 11/04/23 encounter (Telemedicine) with Deanna Ora, MD.    Allergies: Patient is allergic to shellfish-derived products, epinephrine, monosodium glutamate, monosodium glutamate, ciprofloxacin, dronedarone, erythromycin, sulfa antibiotics, sulfamethoxazole-trimethoprim, and tetracyclines & related. Family History: Patient family history includes Cirrhosis (age of onset: 56) in her father; Early death (age of onset: 21) in her mother; Heart disease in her mother; Hypertension in her brother, brother, father, son, and son; Lung cancer in her brother; Other in her brother; Rheumatic fever in her mother; Scleroderma in her daughter. Social History:  Patient  reports that she has quit smoking. Her smoking use included cigarettes. She has been exposed to tobacco smoke. She has never used smokeless tobacco. She reports that  she does not currently use alcohol. She reports that she does not currently use drugs.  Review of Systems: Constitutional: Negative for fever malaise or anorexia Cardiovascular: negative for chest pain Respiratory: negative for SOB or persistent cough Gastrointestinal: negative for abdominal pain  OBJECTIVE Vitals: BP 95/70   Ht 5\' 5"  (1.651 m)   Wt 113 lb (51.3 kg)   BMI 18.80 kg/m  General: no acute distress , A&Ox3  Deanna Ora, MD

## 2023-11-04 NOTE — Progress Notes (Signed)
 I saw Deanna Rivera in neurology clinic on 11/13/23 in follow up for inclusion body myositis.  HPI: Deanna Rivera is a 89 y.o. year old female with a history of HTN, CHF, afib s/p PM, hypothyroidism (Hashimoto's), polymyalgia rheumatica, and prior presumptive diagnosis of inclusion body myositis who we last saw on 05/22/23.  To briefly review: Initial consult (08/14/22): Patient had symptoms since at least 1970s when she was having weakness. She was told she had lupus. She had improvement of symptoms. She had weakness for many years at least prior to 2009. Per records: Patient has had symptoms for many years. She has previously been seen by neurology at [facility] (Dr. Remus Loffler 02/05/2008), and [facility]. She had an EMG in 2009 that showed myopathic changes in multiple muscles and a muscle biopsy that showed inflammatory infiltrates per documentation from [facility] in 2019. 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 She may have also been diagnosed with polymyalgia rheumatica or polymyositis in the past as well, per patient. Of note, CK in 2019 was 404.   Patient is currently in wheelchair most of the time for about 3 months. Prior to this, patient was using a walker. When she using her walker, she would fall very often (more than 60 times). She was progressively getting weaker over the years. About 1 year ago she fell and broke a T9 vertebrae. Since this, she has particularly gotten worse. She was told to use a wheelchair to prevent falls. Patient continues to work with PT but is not improving. Patient was referred to NSGY by PCP ([facility]) who said the fracture had healed but that there was degenerative changes and "things did not heal well." She is not a surgical candidate per patient.    Patient has had difficulty swallowing for years (liquids and solids). Of late, it has been worse. Patient was found to have a left neck mass. Patient was to have a swallow test and CT of neck,  but has not done either.   Patient denies significant shortness of breath, including when laying flat.   Per patient, she has had longstanding numbness in feet (called it neuropathy).  She can have some pins and needles at night (mild).   She also endorses poor sleep. She is not sure why she does not sleep well, but says her husband has to get up multiple times per night that wakes patient and she cannot fall back asleep.   Patient currently lives in independent living. She does not like the food and sometimes misses meals. She denies significant weight loss. She gets PT at her facility.   Family history of neuropathy/myopathy/NM disease? Daughter had scleroderma and died at age 52.   Patient has been on long term steroids for decades. She is currently on 1 mg of prednisone daily.   02/13/23: Per my telephone note from 08/21/22: Called patient to discuss the results of her testing. Overall, her CK and ESR are similar to prior. Her CK has been higher in the past. I think her muscle disease is slowly progressive, as would be expected in IBM (what has been her presumed diagnosis in the past). Her ANA was positive (1:1280) with a negative ENA. Patient does not currently see rheumatology. I discussed that they would be helpful to make sure there was not something we were missing. Patient was in agreement with this. 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 She is agreeable to hematology referral for this.  Patient saw hematology who had a low suspicion for MM and felt IFE represented MGUS.   Patient was admitted from 11/03/22 to 11/07/22 for weakness in the setting of recent flu A infection. Per DC summary:  SLP evaluated and patient was taken for an MBS and she is noted to have mild to moderate primary pharyngeal dysphagia is likely chronic in nature and related to other degenerative changes and spurring in the cervical vertebra as well as generalized weakness and oral consent was largely  functional. She had a markedly reduced laryngeal closure tongue base retraction and epiglottic movement of pharyngeal esophageal segment opening. SLP feels that she has been intermittently aspirating for some time and overall weakness has been exacerbated her swallowing difficulty and SLP feels like her dysphagia will wax and wane and the adverse consequences of aspiration will recur at some point in the future.    Patient went to rehab after hospitalization. She feels like baseline in some regards, but now has more difficulty getting up. She has days that she feels strong and some days she feel weak.    Regarding her swallowing, she does endorse difficulty with swallowing. She is choking daily, but maybe not every meal. Patient got speech therapy as well.   05/22/23: Patient is doing similar to prior. She reports no new issues. She heard people about the power wheelchair, but never heard further after the initial call.   Patient is doing PT and OT. Speech therapy has not started working with her yet, but will. Patient's swallowing continues to get worse. She finds it hard to swallow, including water.    Patient saw rheumatology and had negative myositis work up.  Most recent Assessment and Plan (05/22/23): This is Deanna Rivera, a 89 y.o. female with progressive weakness since at least the 37s. Her neurological examination is pertinent for generalized weakness both distally and proximally, diminished sensation in bilateral lower extremities, hyporeflexia, and inability to ambulate. Previous extensive work up is summarized above but includes MRI showing fatty infiltration of vastus lateralis and semimembranosus muscles. EMG in 2009 showed normal sensory responses, reduced motor responses, and fibrillation potentials on needle examination. Muscle biopsy in 2009 showed active inflammation and muscle deinnervation. She was given the presumptive diagnosis of inclusion body myositis. Patient is currently  wheelchair bound since late 2023.    Patient's symptoms have been present for decades and slowly progressive despite therapy. This is likely in keeping with previous diagnosis of inclusion body myositis (IBM). This may also be responsible for dysphagia, though there may be a neck mass as well. The lack of sensation in lower extremities and "neuropathy" patient describes would be be expected in IBM though. 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 We previously discussed repeating an EMG to clarify, but patient was not interested in pursuing that.   Plan: -Will check into power chair as patient would greatly benefit from and would increase her independence -Continue PT and OT -Highly recommend speech therapy. Patient to discuss with her speech therapy. -Again discuss PEG tube, but patient is not interested in this currently  Since their last visit: Patient now has a power chair. She thinks it is very helpful. She does not need as much help from her husband.  Patient thinks her swallowing is about the same as prior. She thinks her left neck makes a "noise" that only she can hear. She cannot describe it further. She chokes frequently.  She thinks her weakness is similar to prior. She is doing PT/OT twice  per week currently.  She recently had Noravirus and is recovering.   MEDICATIONS:  Outpatient Encounter Medications as of 11/13/2023  Medication Sig   acetaminophen (TYLENOL) 500 MG tablet Take 500 mg by mouth every 6 (six) hours as needed for mild pain or headache.   digoxin (LANOXIN) 0.125 MG tablet TAKE 1 TABLET BY MOUTH DAILY   dorzolamide-timolol (COSOPT) 22.3-6.8 MG/ML ophthalmic solution Place 1 drop into both eyes 2 (two) times daily.   ELIQUIS 2.5 MG TABS tablet TAKE 1 TABLET BY MOUTH TWICE  DAILY   latanoprost (XALATAN) 0.005 % ophthalmic solution Place 1 drop into both eyes at bedtime.   levothyroxine (SYNTHROID) 112 MCG tablet TAKE 1 TABLET BY MOUTH DAILY  BEFORE BREAKFAST    lidocaine (HM LIDOCAINE PATCH) 4 % Place 1 patch onto the skin daily.   metoprolol succinate (TOPROL-XL) 50 MG 24 hr tablet TAKE 1 TABLET BY MOUTH DAILY   polyethylene glycol (MIRALAX / GLYCOLAX) 17 g packet Take 17 g by mouth daily as needed for severe constipation.   predniSONE (DELTASONE) 1 MG tablet Take 1 tablet (1 mg total) by mouth daily.   spironolactone (ALDACTONE) 25 MG tablet Take 0.5 tablets (12.5 mg total) by mouth daily.   furosemide (LASIX) 20 MG tablet Take 1 tablet (20 mg total) by mouth daily.   No facility-administered encounter medications on file as of 11/13/2023.    PAST MEDICAL HISTORY: Past Medical History:  Diagnosis Date   Cataract    Hypertension    Moderate aortic stenosis    Moderate mitral regurgitation    Persistent atrial fibrillation (HCC)    PMR (polymyalgia rheumatica) (HCC)    Second degree AV block    Sick sinus syndrome (HCC)    Thyroid disease     PAST SURGICAL HISTORY: Past Surgical History:  Procedure Laterality Date   ABDOMINAL HYSTERECTOMY     APPENDECTOMY     CESAREAN SECTION     x 3   CHOLECYSTECTOMY     PACEMAKER GENERATOR CHANGE  11/20/2018   [employer] Accolade MRI PPM implanted in [place]   PACEMAKER IMPLANT     PILONIDAL CYST EXCISION      ALLERGIES: Allergies  Allergen Reactions   Shellfish-Derived Products Shortness Of Breath and Other (See Comments)    Lips go numb   Epinephrine Other (See Comments) and Hypertension    "Makes me feel wired"   Monosodium Glutamate Other (See Comments)    other   Monosodium Glutamate Hypertension and Other (See Comments)    "Makes me feel badly"  Other Reaction(s): Other (See Comments)  other, other   Ciprofloxacin Other (See Comments)    Allergic reaction not recalled  Other Reaction(s): Other (See Comments)  Other reaction(s): Intolerance   Dronedarone     Other Reaction(s): Other (See Comments)  Leg swelling, Leg swelling   Erythromycin Other (See Comments)     Allergic reaction not recalled   Sulfa Antibiotics Other (See Comments)    Allergic reaction not recalled   Sulfamethoxazole-Trimethoprim    Tetracyclines & Related Other (See Comments)    Allergic reaction not recalled    FAMILY HISTORY: Family History  Problem Relation Age of Onset   Early death Mother 30   Rheumatic fever Mother    Heart disease Mother        valve replacement   Cirrhosis Father 27   Hypertension Father    Hypertension Brother    Lung cancer Brother    Hypertension Brother  Other Brother        smoker   Scleroderma Daughter    Hypertension Son    Hypertension Son     SOCIAL HISTORY: Social History   Tobacco Use   Smoking status: Former    Types: Cigarettes    Passive exposure: Past   Smokeless tobacco: Never  Vaping Use   Vaping status: Never Used  Substance Use Topics   Alcohol use: Not Currently   Drug use: Not Currently   Social History   Social History Narrative   ** Merged History Encounter **       Recently moved from [place]. In independent living with her husband. Daughter died from scleroderma complications. Hx of falls, but last was > 1.5 years ago. 01/18/2019     Objective:  Vital Signs:  BP 122/64   Pulse 74   Ht 5\' 5"  (1.651 m)   Wt 113 lb (51.3 kg)   SpO2 97%   BMI 18.80 kg/m   General: General appearance: Awake and alert. No distress. Cooperative with exam.  Skin: No obvious rash or jaundice. HEENT: Atraumatic. Anicteric. Lungs: Non-labored breathing on room air   Neurological: Mental Status: Alert. Speech fluent. No pseudobulbar affect Cranial Nerves: CNII: No RAPD. Visual fields intact. CNIII, IV, VI: PERRL. No nystagmus. EOMI. CN V: Facial sensation intact bilaterally to fine touch.  CN VII: Eye closure weak. Mild ptosis. CN VIII: Hears finger rub well bilaterally. CN IX: No hypophonia. CN X: Palate elevates symmetrically. CN XI: Full strength shoulder shrug bilaterally. CN XII: Tongue protrusion  full and midline. No atrophy or fasciculations. Mild dysarthria Motor: Tone is normal.  Individual muscle group testing (MRC grade out of 5):  Movement     Neck flexion 4+    Neck extension 5     Right Left   Shoulder abduction 4 4   Elbow flexion 5- 5-   Elbow extension 4+ 4+   Finger abduction - FDI 4- 4-   Finger abduction - ADM 4- 4-   Finger extension 4- 4-   Finger distal flexion - 2/3 3 3    Finger distal flexion - 4/5 3 3    Thumb flexion - FPL 3 3   Thumb abduction - APB 3 3    Hip flexion 4 4   Knee extension 4+ 4+   Knee flexion 4+ 4+   Dorsiflexion 3 3   Plantarflexion 5- 5-    Reflexes: Trace throughout Sensation: Vibration diminished in bilateral lower extremities to the knees Gait: In wheelchair, unable to safely ambulate   Lab and Test Review: New results: Chem (08/13/23): unremarkable  CBC w/ diff (11/06/23): unremarkable CMP (11/06/23): unremarkable  Previously reviewed results: 02/28/23: C3/C4 wnl dsDNA wnl Anti-Sm wnl Scl-70 wnl RNP ab wnl SSA and SSB wnl   02/21/23: CK: 254 Aldolase wnl ESR: 79 ANA positive (1:320, nuclear, homogeneous) Vit D wnl HMGCoAR ab < 2 Myositis panel: Component     Latest Ref Rng 02/21/2023  JO-1 AB     <11 SI <11   PL-7 AB     <11 SI <11   PL-12 AB     <11 SI <11   EJ AB     <11 SI <11   OJ AB     <11 SI <11   SRP-AB     <11 SI <11   MI-2 ALPHA AB     <11 SI <11   MI-2 BETA AB     <11 SI <11  MDA-5 AB     <11 SI <11   TIF-1y AB     <11 SI <11   NXP-2 AB     <11 SI <11    08/14/22: IFE: IgG lambda monoclonal ab ANA positive: 1:1280, homogeneous dsDNA, RNP, Sm, Scl-70, SSA, SSB negative B1 wnl B12: 493 CK: 251 ESR: 73   Kappa/lambda ratio wnl   CK (10/29/22): 121 TSH (10/29/22): 0.501 ESR (11/04/22): 58 CK (11/04/22): 61  External labs: Per [facility] clinic note from 02/05/2008: CK: since 2002, levels in the 192 to 440 (440 in January 2009. December 03 2007 (5 days post emg) 268 ESR remains  high since 2002 (44-82)  EMG elsewhere (april 17th 2009) Normal upper extremities sensory nerve responses, but for prolongation of median latency, low motor amplitudes ulnar and tibial, needle electrode examination shows fibrillations and positive sharp waves and small motor unit potentials in all muscles tested (proximal and paraspinals- no hand-forearm muscles).  US neck (may 2009): multinodular goiter  MRI left leg (february 2009) Signal abn in vastus lateralis, fatty infiltration in vastus lat and semimembranosus Right leg: vastus changes  Muscle biopsy (april 1st 2009) Active inflammation with muscle fiber destruction and invasion by lymphocytes, mild type 2 atrophy, stains normal, no inclusion (no EM done)  Blood tests June 15th Cmp normal Ck 124 Crp N ESR 44 No monoclonal protein in blood and urine Hepatitis B serology- no exposure ANA, ENA, RF negative TSH 0.27 PTH 72 (could indicate low vitamin D) CBC N Urinalysis normal    Swallow evaluation (11/05/22): Clinical Impression: Clinical Impression: Ms. Boshers presents with a mild-moderate primary pharyngeal dysphagia that is likely chronic in nature and related to degenerative changes and spurring in cervical vertebrae as well as generalized weakness.  Oral phase was largely functional.  Pharyngeal phase was marked by reduced laryngeal closure, tongue base retraction, epiglottic movement, and pharyngoesophageal segment opening.  These impairments in the swallow were partially due to appearance of osteophytes which inhibited full inversion of epiglottis over larynx.  There was a prominent CP, leading to material collecting above.  Reduced propulsion through the pharynx/PES was also related to inferred weakness of the base of tongue/pharyngeal wall. There was consistent penetration of thin liquids, often to the level of the vocal folds. Penetration elicited a frequent cough response; there was minimal aspiration.  Solid food residue  tended to remain in the valleculae and was difficult to transition.  A chin tuck was trialed, it may have offered marginal benefit in bolus transfer of solids and airway protection with thin liquids. Spent time after the study reviewing imaging in pt's room and discussing results with pt/husband.  She should try tucking chin with all solids/liquids. Continue current diet (regular solids/thin liquids); Pt and husband will need a lot of reinforcement to recall results and precautions. SLP will follow while admitted.   Recommendations/Plan: Swallowing Evaluation Recommendations Swallowing Evaluation Recommendations Recommendations: PO diet PO Diet Recommendation: Regular; Thin liquids (Level 0) Liquid Administration via: Cup; Straw Medication Administration: Whole meds with puree Supervision: Patient able to self-feed Swallowing strategies  : Chin tuck Postural changes: Position pt fully upright for meals   CT head (11/04/22): IMPRESSION: No acute intracranial abnormality noted.   Chronic ischemic changes are noted.   Echo (10/16/22): 1. Left ventricular ejection fraction, by estimation, is 65 to 70%. The  left ventricle has normal function. The left ventricle has no regional  wall motion abnormalities. Left ventricular diastolic function could not  be evaluated.   2.  Right ventricular systolic function is normal. The right ventricular  size is normal. There is severely elevated pulmonary artery systolic  pressure. The estimated right ventricular systolic pressure is 62.1 mmHg.   3. Left atrial size was severely dilated.   4. Right atrial size was severely dilated.   5. The mitral valve is normal in structure. Mild to moderate mitral valve  regurgitation.   6. Tricuspid valve regurgitation is moderate to severe.   7. The aortic valve is tricuspid. There is mild calcification of the  aortic valve. There is moderate thickening of the aortic valve. Aortic  valve regurgitation is trivial.  Moderate aortic valve stenosis.   8. The inferior vena cava is dilated in size with <50% respiratory  variability, suggesting right atrial pressure of 15 mmHg.    CT thoracic spine wo contrast (07/11/22): IMPRESSION: Chronic T8 compression fracture with progressive anterior height loss, measuring up to 60%, previously 20% on CT June 2022, with exaggerated thoracic kyphosis centered at T8. Unchanged 3 mm inferior endplate retropulsion with resultant mild spinal canal stenosis at this level.   No new compression fracture.   Mild multilevel degenerative changes of the thoracic spine.   3 mm pulmonary nodule in the left upper lobe. No follow-up needed if patient is low-risk. A 12 month follow-up chest CT can be considered if the patient is high-risk.This recommendation follows the consensus statement: Guidelines for Management of Incidental Pulmonary Nodules Detected on CT Images: From the Fleischner Society 2017; Radiology 2017; 284:228-243.   CT cervical spine wo contrast (08/09/21): IMPRESSION: 1. Slight anterior subluxations at C2-3 and C3-4, similar to prior study, likely degenerative. 2. No acute displaced fractures identified. 3. Diffuse degenerative changes.   CT head wo contrast (09/04/21): IMPRESSION: Atrophy, chronic microvascular disease.   No acute intracranial abnormality.  ASSESSMENT: This is Deanna Rivera, a 89 y.o. female with progressive weakness since at least the 41s. Her neurological examination is pertinent for mild dysarthria, generalized weakness both distally and proximally, diminished sensation in bilateral lower extremities, hyporeflexia, and inability to ambulate. Previous extensive work up is summarized above but includes MRI showing fatty infiltration of vastus lateralis and semimembranosus muscles. EMG in 2009 showed normal sensory responses, reduced motor responses, and fibrillation potentials on needle examination. Muscle biopsy in 2009 showed  active inflammation and muscle deinnervation. She was given the presumptive diagnosis of inclusion body myositis. Patient is currently wheelchair bound since late 2023.    Patient's symptoms have been present for decades and slowly progressive despite therapy. This is likely in keeping with previous diagnosis of inclusion body myositis (IBM). This may also be responsible for dysphagia as there has not been another etiology identified. The lack of sensation in lower extremities and "neuropathy" patient describes would be be expected in IBM though. 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 We previously discussed repeating an EMG to clarify, but patient has not interested in pursuing that.  Plan: -Continue PT and OT -Again discussed PEG. Patient is not interested currently. Discussed swallow precautions. -Fall precautions discussed  Return to clinic in 6 months  Total time spent reviewing records, interview, history/exam, documentation, and coordination of care on day of encounter:  30 min  Jacquelyne Balint, MD

## 2023-11-05 ENCOUNTER — Ambulatory Visit: Payer: Self-pay | Admitting: Family Medicine

## 2023-11-05 ENCOUNTER — Telehealth (HOSPITAL_COMMUNITY): Payer: Self-pay | Admitting: Cardiology

## 2023-11-05 NOTE — Telephone Encounter (Signed)
 Copied From CRM [id number]. Reason for Triage: Patient is having Diarrhea and would like to know if she should continue to take her Lasix    Chief Complaint: abd pain Symptoms: diarrhea; dehydration Frequency: intermittent Pertinent Negatives: Patient denies shortness of breath Disposition: [] ED /[] Urgent Care (no appt availability in office) / [] Appointment(In office/virtual)/ []  [place] [facility]/ [] Home Care/ [] Refused Recommended Disposition /[] [place] [facility]/ [x]  Follow-up with PCP Additional Notes: Patient reports intermittent abd pain since yesterday, reportd diarrhea as well. Patient concerned about being dehydrated since she is also taking Lasix. Per chart review, the RX for Lasix was expired in 06/2023 and pt should only be taking Spironolactone. Pt sts that she is taking both. RN advising PCP appt and f/u with her cardiologist. Pt refusing pcp appt at this time, would prefer virtual visit. Will defer scheduling to clinic. RN provided pt number to [facility] to f/u concerning the diuretics.   Reason for Disposition  [1] MODERATE pain (e.g., interferes with normal activities) AND [2] pain comes and goes (cramps) AND [3] present > 24 hours  (Exception: Pain with Vomiting or Diarrhea - see that Guideline.)  Answer Assessment - Initial Assessment Questions 1. LOCATION: "Where does it hurt?"      The entire stomach  2. RADIATION: "Does the pain shoot anywhere else?" (e.g., chest, back)     No   3. ONSET: "When did the pain begin?" (e.g., minutes, hours or days ago)      Yesterday  4. SUDDEN: "Gradual or sudden onset?"     Gradual  5. PATTERN "Does the pain come and go, or is it constant?"    - If it comes and goes: "How long does it last?" "Do you have pain now?"     (Note: Comes and goes means the pain is intermittent. It goes away completely between bouts.)    - If constant: "Is it getting better, staying the same, or getting worse?"      (Note:  Constant means the pain never goes away completely; most serious pain is constant and gets worse.)      Comes and go  6. SEVERITY: "How bad is the pain?"  (e.g., Scale 1-10; mild, moderate, or severe)    - MILD (1-3): Doesn't interfere with normal activities, abdomen soft and not tender to touch.     - MODERATE (4-7): Interferes with normal activities or awakens from sleep, abdomen tender to touch.     - SEVERE (8-10): Excruciating pain, doubled over, unable to do any normal activities.       10/10 when the spasm occurs  7. RECURRENT SYMPTOM: "Have you ever had this type of stomach pain before?" If Yes, ask: "When was the last time?" and "What happened that time?"      No  8. CAUSE: "What do you think is causing the stomach pain?"     Unsure  9. RELIEVING/AGGRAVATING FACTORS: "What makes it better or worse?" (e.g., antacids, bending or twisting motion, bowel movement)     No factors  10. OTHER SYMPTOMS: "Do you have any other symptoms?" (e.g., back pain, diarrhea, fever, urination pain, vomiting)       Diarrhea  11. PREGNANCY: "Is there any chance you are pregnant?" "When was your last menstrual period?"       no  Protocols used: Abdominal Pain - [facility]

## 2023-11-05 NOTE — Telephone Encounter (Signed)
 Patient called to review diuretic regimen while dealing with stomach virus  Reports she feels very weak with increased diarrhea otherwise asymptomatic   Reports she has message to PCP regarding stomach bug however concerned for dehydration  Advised generally ok to hold diuretic for 24-48 hours until normal eating/drink/virus passes, anything additional would have to come from provider  Pt request exact amount of days to hold spiro and lasix  Patient aware to schedule follow up with Dr Ladona Ridgel   Please advise

## 2023-11-05 NOTE — Telephone Encounter (Signed)
 Pt aware and voiced understanding

## 2023-11-06 ENCOUNTER — Ambulatory Visit (INDEPENDENT_AMBULATORY_CARE_PROVIDER_SITE_OTHER): Admitting: Family Medicine

## 2023-11-06 ENCOUNTER — Ambulatory Visit: Payer: Medicare Other

## 2023-11-06 ENCOUNTER — Encounter: Payer: Self-pay | Admitting: Family Medicine

## 2023-11-06 VITALS — BP 136/86 | HR 87 | Temp 97.8°F | Ht 65.0 in | Wt 113.0 lb

## 2023-11-06 DIAGNOSIS — I10 Essential (primary) hypertension: Secondary | ICD-10-CM

## 2023-11-06 DIAGNOSIS — R600 Localized edema: Secondary | ICD-10-CM

## 2023-11-06 DIAGNOSIS — I495 Sick sinus syndrome: Secondary | ICD-10-CM | POA: Diagnosis not present

## 2023-11-06 DIAGNOSIS — K529 Noninfective gastroenteritis and colitis, unspecified: Secondary | ICD-10-CM | POA: Diagnosis not present

## 2023-11-06 DIAGNOSIS — I5032 Chronic diastolic (congestive) heart failure: Secondary | ICD-10-CM | POA: Diagnosis not present

## 2023-11-06 LAB — COMPREHENSIVE METABOLIC PANEL
ALT: 25 U/L (ref 0–35)
AST: 33 U/L (ref 0–37)
Albumin: 3.7 g/dL (ref 3.5–5.2)
Alkaline Phosphatase: 51 U/L (ref 39–117)
BUN: 17 mg/dL (ref 6–23)
CO2: 28 meq/L (ref 19–32)
Calcium: 10.2 mg/dL (ref 8.4–10.5)
Chloride: 101 meq/L (ref 96–112)
Creatinine, Ser: 0.24 mg/dL — ABNORMAL LOW (ref 0.40–1.20)
GFR: 99.71 mL/min (ref >60.00)
Glucose, Bld: 117 mg/dL — ABNORMAL HIGH (ref 70–99)
Potassium: 3.8 meq/L (ref 3.5–5.1)
Sodium: 136 meq/L (ref 135–145)
Total Bilirubin: 0.4 mg/dL (ref 0.2–1.2)
Total Protein: 8 g/dL (ref 6.0–8.3)

## 2023-11-06 LAB — CBC WITH DIFFERENTIAL/PLATELET
Basophils Absolute: 0 10*3/uL (ref 0.0–0.1)
Basophils Relative: 0.3 % (ref 0.0–3.0)
Eosinophils Absolute: 0 10*3/uL (ref 0.0–0.7)
Eosinophils Relative: 0.5 % (ref 0.0–5.0)
HCT: 40.1 % (ref 36.0–46.0)
Hemoglobin: 13.6 g/dL (ref 12.0–15.0)
Lymphocytes Relative: 31 % (ref 12.0–46.0)
Lymphs Abs: 1.5 10*3/uL (ref 0.7–4.0)
MCHC: 33.8 g/dL (ref 30.0–36.0)
MCV: 98.3 fl (ref 78.0–100.0)
Monocytes Absolute: 0.4 10*3/uL (ref 0.1–1.0)
Monocytes Relative: 9.1 % (ref 3.0–12.0)
Neutro Abs: 2.8 10*3/uL (ref 1.4–7.7)
Neutrophils Relative %: 59.1 % (ref 43.0–77.0)
Platelets: 180 10*3/uL (ref 150.0–400.0)
RBC: 4.08 Mil/uL (ref 3.87–5.11)
RDW: 13.6 % (ref 11.5–15.5)
WBC: 4.7 10*3/uL (ref 4.0–10.5)

## 2023-11-06 NOTE — Patient Instructions (Signed)
 Please follow up if symptoms do not improve or as needed.    VISIT SUMMARY:  Today, we discussed your recent experience with diarrhea and concerns about potential electrolyte imbalance. We also addressed the risk of a urinary tract infection and your goals of care.  YOUR PLAN:  -ACUTE GASTROENTERITIS: Acute gastroenteritis is an inflammation of the stomach and intestines, often caused by a viral infection like norovirus. It can lead to symptoms such as diarrhea, abdominal pain, and dehydration. We discussed the importance of staying hydrated, using Imodium if symptoms worsen, and holding off on your diuretic medication until the diarrhea has resolved for at least 24 hours. Blood tests have been ordered to check your electrolyte levels.  -RISK OF URINARY TRACT INFECTION: Due to your recent diarrhea and frequent urination, you are at risk for a urinary tract infection (UTI). Although you do not currently have any urinary symptoms, it is important to monitor for them and report any changes so we can consider antibiotic treatment if necessary.  -GOALS OF CARE: We discussed your concerns about a friend who was advised to seek hospice care. We emphasized the importance of focusing on quality of life and the natural course of life.  INSTRUCTIONS:  Please maintain hydration with water but avoid excessive intake to prevent fluid overload. Use Imodium if your diarrhea worsens. Hold off on taking your diuretic medication until your diarrhea has resolved for at least 24 hours. Monitor for any urinary symptoms and report them if they occur. We will review your blood test results tomorrow morning and communicate any critical findings promptly. If your diarrhea persists beyond 3-5 days, please report it for further evaluation.

## 2023-11-06 NOTE — Progress Notes (Signed)
 See mychart note Dear Ms. Nordmeyer, Your labwork looks fine.  You should do well.  Sincerely, Dr. Mardelle Matte

## 2023-11-06 NOTE — Progress Notes (Signed)
 Subjective  CC:  Chief Complaint  Patient presents with   Diarrhea    Pt hstated that she has been having diarrhea since Monday night and Tuesday.Marland Kitchen Has not gone today yet     HPI: Deanna Rivera is a 89 y.o. female who presents to the office today to address the problems listed above in the chief complaint. Discussed the use of AI scribe software for clinical note transcription with the patient, who gave verbal consent to proceed.  History of Present Illness   Deanna Rivera is an 89 year old female who presents with diarrhea and concerns about electrolyte imbalance.  She has been experiencing diarrhea since Monday, November 04, 2023, with frequent, watery, and runny stools occurring multiple times during the day and night, without mucus or blood. She is concerned about potential electrolyte imbalance due to the diarrhea.  She has experienced violent abdominal pains described as spasms and had one episode of dry heaves without vomiting. No lightheadedness, chest pain, or fever has been noted, but she reports a higher heart rate than usual.  She has been holding off on taking her diuretic medication due to the diarrhea. Despite this, she reports frequent urination throughout the night. She has been drinking water to stay hydrated and has not taken any medication for the diarrhea, which has calmed down on its own today.  Her son recently returned from a trip to the [place] where he experienced similar symptoms, but he has since recovered. She wonders if there could be a connection, although it is noted that his illness was likely due to something he consumed abroad and not infectious.       Assessment  1. Gastroenteritis   2. Chronic heart failure with preserved ejection fraction (HCC)   3. Bilateral lower extremity edema   4. Benign essential hypertension      Plan  Assessment and Plan    Acute Gastroenteritis Suspected viral etiology.Risk for electrolyte imbalance due to  diarrhea. Discussed dehydration and electrolyte imbalance risks, benefits of hydration, and Imodium use if symptoms worsen. Held diuretics to prevent dehydration. - Order blood tests to check electrolytes. - Advise to maintain hydration with water. - Recommend Imodium for diarrhea if symptoms worsen. - Advise against excessive water intake to avoid fluid overload. - Hold diuretics until diarrhea resolves for at least 24 hours.  Risk of Urinary Tract Infection At risk due to recent diarrhea and frequent urination. No current urinary symptoms. Prophylactic antibiotics not prescribed to avoid unnecessary treatment. - Monitor for urinary symptoms. - Advise to report any urinary symptoms for potential antibiotic treatment.  Goals of Care Discussed concerns about a friend advised to seek hospice care, emphasizing quality of life and natural course of life.  Follow-up Concerned about electrolyte levels and recovery from gastroenteritis. Reassured that critical lab results would be communicated promptly. - Review blood test results the following morning. - Advise to report if diarrhea persists beyond 3-5 days for further evaluation. - Advise to resume diuretics once diarrhea has resolved for 24 hours.        Orders Placed This Encounter  Procedures   Comprehensive metabolic panel   CBC with Differential/Platelet   No orders of the defined types were placed in this encounter.    I reviewed the patients updated PMH, FH, and SocHx.    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     Priority: High   Nontoxic multinodular goiter 09/08/2015    Priority: High   Current use of long term anticoagulation 05/16/2015    Priority: High   (HFpEF) heart failure with preserved ejection fraction (HCC) 09/13/2014    Priority: High   Pacemaker 11/09/2011    Priority: High   Impaired fasting glucose 05/23/2009    Priority: High   Long term current use of systemic steroids 03/30/2008    Priority: High   Polymyalgia rheumatica (HCC) 03/30/2008    Priority: High   Benign essential hypertension 05/05/2007    Priority: High   Persistent atrial fibrillation (HCC) 11/06/2006    Priority: High   Pure hypercholesterolemia 11/06/2006    Priority: High   Hypothyroidism due to Hashimoto's thyroiditis, Rx Levothyroxine 06/25/2005    Priority: High   Degenerative lumbar spinal stenosis 05/30/2022    Priority: Medium    Compression fracture of body of thoracic vertebra (HCC) 02/06/2021    Priority: Medium    Primary osteoarthritis of right knee 10/23/2019    Priority: Medium    Nonrheumatic mitral valve regurgitation 09/16/2015    Priority: Medium    Recurrent UTI 03/14/2015    Priority: Medium    Sensorineural hearing loss (SNHL) of both ears 01/14/2022    Priority: Low   Neuralgia, postherpetic 07/02/2019    Priority: Low   OAB (overactive bladder) 07/02/2019    Priority: Low   Constipation 01/18/2019    Priority: Low   Protein-calorie malnutrition, severe 11/06/2022   Frequent falls 05/17/2021   Current Meds  Medication Sig   acetaminophen (TYLENOL) 500 MG tablet Take 500 mg by mouth every 6 (six) hours as needed for mild pain or headache.   digoxin (LANOXIN) 0.125 MG tablet TAKE 1 TABLET BY MOUTH DAILY   dorzolamide-timolol (COSOPT) 22.3-6.8 MG/ML ophthalmic solution Place 1 drop into both eyes 2 (two) times daily.   ELIQUIS 2.5 MG TABS tablet TAKE 1 TABLET BY MOUTH TWICE  DAILY   latanoprost (XALATAN) 0.005 % ophthalmic solution Place 1 drop into both eyes at bedtime.    levothyroxine (SYNTHROID) 112 MCG tablet TAKE 1 TABLET BY MOUTH DAILY  BEFORE BREAKFAST   lidocaine (HM LIDOCAINE PATCH) 4 % Place 1 patch onto the skin daily.   metoprolol succinate (TOPROL-XL) 50 MG 24 hr tablet TAKE 1 TABLET BY MOUTH DAILY   polyethylene glycol (MIRALAX / GLYCOLAX) 17 g packet Take 17 g by mouth daily as needed for severe constipation.   predniSONE (DELTASONE) 1 MG tablet Take 1 tablet (1 mg total) by mouth daily.   spironolactone (ALDACTONE) 25 MG tablet Take 0.5 tablets (12.5 mg total) by mouth daily.    Allergies: Patient is allergic to shellfish-derived products, epinephrine, monosodium glutamate, monosodium glutamate, ciprofloxacin, dronedarone, erythromycin, sulfa antibiotics, sulfamethoxazole-trimethoprim, and tetracyclines & related. Family History: Patient family history includes Cirrhosis (age of onset: 62) in her father; Early death (age of onset: 73) in her mother; Heart disease in her mother; Hypertension in her brother, brother, father, son, and son; Lung cancer in her brother; Other in her brother; Rheumatic fever in her mother; Scleroderma in her daughter. Social History:  Patient  reports that she has quit smoking. Her smoking  use included cigarettes. She has been exposed to tobacco smoke. She has never used smokeless tobacco. She reports that she does not currently use alcohol. She reports that she does not currently use drugs.  Review of Systems: Constitutional: Negative for fever malaise or anorexia Cardiovascular: negative for chest pain Respiratory: negative for SOB or persistent cough Gastrointestinal: negative for abdominal pain  Objective  Vitals: BP 136/86   Pulse 87   Temp 97.8 F (36.6 C)   Ht 5\' 5"  (1.651 m)   Wt 113 lb (51.3 kg)   SpO2 98%   BMI 18.80 kg/m  General: no acute distress , A&Ox3 HEENT: PEERL, conjunctiva normal, neck is supple, moist mucous membranes Cardiovascular:  irreg irreg Respiratory:  Good breath sounds  bilaterally, CTAB with normal respiratory effort Skin:  Warm, no rashes Ext: tr edema  Commons side effects, risks, benefits, and alternatives for medications and treatment plan prescribed today were discussed, and the patient expressed understanding of the given instructions. Patient is instructed to call or message via MyChart if he/she has any questions or concerns regarding our treatment plan. No barriers to understanding were identified. We discussed Red Flag symptoms and signs in detail. Patient expressed understanding regarding what to do in case of urgent or emergency type symptoms.  Medication list was reconciled, printed and provided to the patient in AVS. Patient instructions and summary information was reviewed with the patient as documented in the AVS. This note was prepared with assistance of Dragon voice recognition software. Occasional wrong-word or sound-a-like substitutions may have occurred due to the inherent limitations of voice recognition software

## 2023-11-07 ENCOUNTER — Encounter: Payer: Self-pay | Admitting: Internal Medicine

## 2023-11-07 LAB — CUP PACEART REMOTE DEVICE CHECK
Battery Remaining Longevity: 138 mo
Battery Remaining Percentage: 100 %
Brady Statistic RA Percent Paced: 0 %
Brady Statistic RV Percent Paced: 69 %
Date Time Interrogation Session: 20250327064100
Implantable Lead Connection Status: 753985
Implantable Lead Connection Status: 753985
Implantable Lead Implant Date: 20130305
Implantable Lead Implant Date: 20130305
Implantable Lead Location: 753859
Implantable Lead Location: 753860
Implantable Lead Model: 4469
Implantable Lead Model: 4470
Implantable Lead Serial Number: 546229
Implantable Lead Serial Number: 712016
Implantable Pulse Generator Implant Date: 20200409
Lead Channel Impedance Value: 410 Ohm
Lead Channel Pacing Threshold Amplitude: 0.6 V
Lead Channel Pacing Threshold Pulse Width: 0.4 ms
Lead Channel Setting Pacing Amplitude: 1 V
Lead Channel Setting Pacing Pulse Width: 0.4 ms
Lead Channel Setting Sensing Sensitivity: 0.6 mV
Pulse Gen Serial Number: 859007
Zone Setting Status: 755011

## 2023-11-11 ENCOUNTER — Ambulatory Visit: Payer: Self-pay

## 2023-11-11 NOTE — Telephone Encounter (Signed)
 Copied From CRM [id number]. Reason for Triage: Patient states she's urinating more than what she's drinking, on waters pills but states she's worried that her potassium and sodium is low, having a little cramping in legs. States her blood pressure yesterday was 99/73 and today 109/70.   Chief Complaint: leg cramping, peeing more than she is drinking; states trying to juggle pills and would like to talk to PCP Symptoms: see above Frequency: started this am Pertinent Negatives: Patient denies cp, sob, calf swelling/redness Disposition: [] ED /[] Urgent Care (no appt availability in office) / [] Appointment(In office/virtual)/ []  [place] [facility]/ [] Home Care/ [] Refused Recommended Disposition /[] [place] [facility]/ [x]  Follow-up with PCP Additional Notes: would like a call back from pcp regarding medication and labs;  states she is worried her sodium and potassium are low again.  Care advice given, denies questions; instructed to go to ER if becomes worse.   Reason for Disposition  [1] MODERATE pain (e.g., interferes with normal activities, limping) AND [2] present > 3 days  Answer Assessment - Initial Assessment Questions 1. ONSET: "When did the pain start?"      This am 2. LOCATION: "Where is the pain located?"      Right leg cramping 3. PAIN: "How bad is the pain?"    (Scale 1-10; or mild, moderate, severe)   -  MILD (1-3): doesn't interfere with normal activities    -  MODERATE (4-7): interferes with normal activities (e.g., work or school) or awakens from sleep, limping    -  SEVERE (8-10): excruciating pain, unable to do any normal activities, unable to walk     mild 4. WORK OR EXERCISE: "Has there been any recent work or exercise that involved this part of the body?"      denies 5. CAUSE: "What do you think is causing the leg pain?"     Maybe low sodium and potassium 6. OTHER SYMPTOMS: "Do you have any other symptoms?" (e.g., chest pain, back pain, breathing difficulty,  swelling, rash, fever, numbness, weakness)     swelling 7. PREGNANCY: "Is there any chance you are pregnant?" "When was your last menstrual period?"     na  Protocols used: Leg Pain-A-AH

## 2023-11-12 ENCOUNTER — Telehealth: Payer: Self-pay

## 2023-11-12 NOTE — Telephone Encounter (Signed)
 Copied from CRM [id number]. Topic: Clinical - Lab/Test Results >> Nov 12, 2023 12:32 PM Deanna Rivera wrote: Reason for CRM: Patient has some questions regarding ehr electrolyte levels.  Pt stated that her lad say every thing is ok but she does not feel ok. Just tired of going back an d forth to restroom

## 2023-11-12 NOTE — Telephone Encounter (Signed)
 Copied from CRM [id number]. Topic: General - Call Back - No Documentation >> Nov 12, 2023 12:31 PM [place] J wrote: Reason for CRM: Patient returning a call back from earlier today and would like a call back from a Engineer, site.  I have already spoken to pt regarding her questions/concerns.  Message has been sent to provider to address

## 2023-11-13 ENCOUNTER — Encounter: Payer: Self-pay | Admitting: Neurology

## 2023-11-13 ENCOUNTER — Ambulatory Visit (INDEPENDENT_AMBULATORY_CARE_PROVIDER_SITE_OTHER): Payer: Medicare Other | Admitting: Neurology

## 2023-11-13 VITALS — BP 122/64 | HR 74 | Ht 65.0 in | Wt 113.0 lb

## 2023-11-13 DIAGNOSIS — R1310 Dysphagia, unspecified: Secondary | ICD-10-CM

## 2023-11-13 DIAGNOSIS — G629 Polyneuropathy, unspecified: Secondary | ICD-10-CM | POA: Diagnosis not present

## 2023-11-13 DIAGNOSIS — G7241 Inclusion body myositis [IBM]: Secondary | ICD-10-CM

## 2023-11-13 DIAGNOSIS — R5381 Other malaise: Secondary | ICD-10-CM | POA: Diagnosis not present

## 2023-11-13 DIAGNOSIS — R471 Dysarthria and anarthria: Secondary | ICD-10-CM

## 2023-11-13 DIAGNOSIS — R531 Weakness: Secondary | ICD-10-CM

## 2023-11-13 DIAGNOSIS — R209 Unspecified disturbances of skin sensation: Secondary | ICD-10-CM

## 2023-11-13 DIAGNOSIS — R262 Difficulty in walking, not elsewhere classified: Secondary | ICD-10-CM

## 2023-11-13 DIAGNOSIS — D472 Monoclonal gammopathy: Secondary | ICD-10-CM

## 2023-11-13 NOTE — Patient Instructions (Signed)
 Continue PT and OT.  If you decide you want a referral to speech therapy, let me know.  Please be careful with your swallowing as choking is a serious hazard for you.  Let me know if there is anything else I can help with. I will continue to see you every 6 months.  The physicians and staff at [facility] are committed to providing excellent care. You may receive a survey requesting feedback about your experience at our office. We strive to receive "very good" responses to the survey questions. If you feel that your experience would prevent you from giving the office a "very good " response, please contact our office to try to remedy the situation. We may be reached at [phone]. Thank you for taking the time out of your busy day to complete the survey.  Jacquelyne Balint, MD [facility]  Preventing Falls at [facility] are common, often dreaded events in the lives of older people. Aside from the obvious injuries and even death that may result, fall can cause wide-ranging consequences including loss of independence, mental decline, decreased activity and mobility. Younger people are also at risk of falling, especially those with chronic illnesses and fatigue.  Ways to reduce risk for falling Examine diet and medications. Warm foods and alcohol dilate blood vessels, which can lead to dizziness when standing. Sleep aids, antidepressants and pain medications can also increase the likelihood of a fall.  Get a vision exam. Poor vision, cataracts and glaucoma increase the chances of falling.  Check foot gear. Shoes should fit snugly and have a sturdy, nonskid sole and a broad, low heel  Participate in a physician-approved exercise program to build and maintain muscle strength and improve balance and coordination. Programs that use ankle weights or stretch bands are excellent for muscle-strengthening. Water aerobics programs and low-impact Tai Chi programs have also been shown to improve  balance and coordination.  Increase vitamin D intake. Vitamin D improves muscle strength and increases the amount of calcium the body is able to absorb and deposit in bones.  How to prevent falls from common hazards Floors - Remove all loose wires, cords, and throw rugs. Minimize clutter. Make sure rugs are anchored and smooth. Keep furniture in its usual place.  Chairs -- Use chairs with straight backs, armrests and firm seats. Add firm cushions to existing pieces to add height.  Bathroom - Install grab bars and non-skid tape in the tub or shower. Use a bathtub transfer bench or a shower chair with a back support Use an elevated toilet seat and/or safety rails to assist standing from a low surface. Do not use towel racks or bathroom tissue holders to help you stand.  Lighting - Make sure halls, stairways, and entrances are well-lit. Install a night light in your bathroom or hallway. Make sure there is a light switch at the top and bottom of the staircase. Turn lights on if you get up in the middle of the night. Make sure lamps or light switches are within reach of the bed if you have to get up during the night.  Kitchen - Install non-skid rubber mats near the sink and stove. Clean spills immediately. Store frequently used utensils, pots, pans between waist and eye level. This helps prevent reaching and bending. Sit when getting things out of lower cupboards.  Living room/ Bedrooms - Place furniture with wide spaces in between, giving enough room to move around. Establish a route through the living room that gives you  something to hold onto as you walk.  Stairs - Make sure treads, rails, and rugs are secure. Install a rail on both sides of the stairs. If stairs are a threat, it might be helpful to arrange most of your activities on the lower level to reduce the number of times you must climb the stairs.  Entrances and doorways - Install metal handles on the walls adjacent to the doorknobs of all  doors to make it more secure as you travel through the doorway.  Tips for maintaining balance Keep at least one hand free at all times. Try using a backpack or fanny pack to hold things rather than carrying them in your hands. Never carry objects in both hands when walking as this interferes with keeping your balance.  Attempt to swing both arms from front to back while walking. This might require a conscious effort if Parkinson's disease has diminished your movement. It will, however, help you to maintain balance and posture, and reduce fatigue.  Consciously lift your feet off of the ground when walking. Shuffling and dragging of the feet is a common culprit in losing your balance.  When trying to navigate turns, use a "U" technique of facing forward and making a wide turn, rather than pivoting sharply.  Try to stand with your feet shoulder-length apart. When your feet are close together for any length of time, you increase your risk of losing your balance and falling.  Do one thing at a time. Don't try to walk and accomplish another task, such as reading or looking around. The decrease in your automatic reflexes complicates motor function, so the less distraction, the better.  Do not wear rubber or gripping soled shoes, they might "catch" on the floor and cause tripping.  Move slowly when changing positions. Use deliberate, concentrated movements and, if needed, use a grab bar or walking aid. Count 15 seconds between each movement. For example, when rising from a seated position, wait 15 seconds after standing to begin walking.  If balance is a continuous problem, you might want to consider a walking aid such as a cane, walking stick, or walker. Once you've mastered walking with help, you might be ready to try it on your own again.

## 2023-11-15 ENCOUNTER — Telehealth: Payer: Self-pay

## 2023-11-15 NOTE — Telephone Encounter (Signed)
 Copied from CRM [id number]. Topic: Clinical - Medication Question >> Nov 15, 2023 12:09 PM Sonny Dandy B wrote: Reason for CRM:pt called to advise she is ok to have surgery, wants to speak to the provider. Please call [phone]

## 2023-11-15 NOTE — Telephone Encounter (Signed)
 Copied from CRM [id number]. Topic: Clinical - Medication Question >> Nov 15, 2023 12:05 PM Sonny Dandy B wrote: Reason for CRM: pt called to speak with provider regarding her eletrolytes. States she needs advise on what to do. Please call pt back at [phone]  Please advise on this.  Message has been sent to provider to address

## 2023-11-18 ENCOUNTER — Telehealth: Payer: Self-pay | Admitting: Internal Medicine

## 2023-11-18 NOTE — Telephone Encounter (Signed)
 Spoke with pt. Pt was very hard to understand and stated she was unable to hear me well. Pt states she was sick a few weeks ago and had lab work from PCP. Pt states PCP said lab work was fine (as it states in [employer]) but that she "was playing doctor" and the lab results on her "calcium , potassium and other electrolytes" was low. (Lab results referring to are from 3/26)  She states her BP's have been low. She did not have BP readings to give but took a reading while on the phone. Pt did not give me that reading  stating her BP was back up because she had been talking to me. Pt stated she had not been taking her metoprolol correctly because of the sickness and things were "messed up". Dr Mardelle Matte (PCP) follows her BP and prescribes metoprolol. Advised pt to call Dr Modesta Messing office. Pt refused. Offered to ask Dr Ladona Ridgel to refer her to a General Cardiology doctor and possibly order lab work for her. She asked when the labwork would be and I stated to the patient I needed an order from Dr Ladona Ridgel to do that but would put it in as soon as I got the ok. Advised pt again to call PCP. Gave pt ED/911 precautions. Pt asked for my name again and  hung up.  Will forward to Dr Ladona Ridgel for any recommendations as I did not get a clear yes or no for referral and labwork.

## 2023-11-18 NOTE — Telephone Encounter (Signed)
 Pt called in asking to speak with Dr. Lubertha Basque nurse about her electrolytes and her BP.

## 2023-11-25 ENCOUNTER — Telehealth: Payer: Self-pay

## 2023-11-25 ENCOUNTER — Ambulatory Visit: Payer: Self-pay

## 2023-11-25 NOTE — Telephone Encounter (Signed)
 Copied from CRM [id number]. Topic: Clinical - Red Word Triage >> Nov 25, 2023 10:21 AM Alethia Huxley E wrote: [facility] that prompted transfer to Nurse Triage: Fluctuation of blood pressure. Patient stated her blood pressure last night was 91/63, and this morning it was 176/113. >> Nov 25, 2023 11:25 AM Allyne Areola wrote: Patient called to schedule the appointment with Alexander Iba.

## 2023-11-25 NOTE — Telephone Encounter (Signed)
 Copied from CRM [id number]. Topic: Clinical - Red Word Triage >> Nov 25, 2023 10:21 AM Alethia Huxley E wrote: [facility] that prompted transfer to Nurse Triage: Fluctuation of blood pressure. Patient stated her blood pressure last night was 91/63, and this morning it was 176/113.    Chief Complaint: BP "going up and down still." This morning 176/113. Asking for appointment today or tomorrow. Declines another office. Wants to be worked in. Symptoms: Above Frequency: Today Pertinent Negatives: Patient denies chest pain Disposition: [] ED /[] Urgent Care (no appt availability in office) / [] Appointment(In office/virtual)/ []  [place] [facility]/ [] Home Care/ [] Refused Recommended Disposition /[] [place] [facility]/ [x]  Follow-up with PCP Additional Notes: Please advise pt.  Reason for Disposition  Systolic BP  >= 160 OR Diastolic >= 100  Answer Assessment - Initial Assessment Questions 1. BLOOD PRESSURE: "What is the blood pressure?" "Did you take at least two measurements 5 minutes apart?"     176/113 2. ONSET: "When did you take your blood pressure?"     Today 3. HOW: "How did you take your blood pressure?" (e.g., automatic home BP monitor, visiting nurse)     Home cuff 4. HISTORY: "Do you have a history of high blood pressure?"     Yes 5. MEDICINES: "Are you taking any medicines for blood pressure?" "Have you missed any doses recently?"     No 6. OTHER SYMPTOMS: "Do you have any symptoms?" (e.g., blurred vision, chest pain, difficulty breathing, headache, weakness)     No 7. PREGNANCY: "Is there any chance you are pregnant?" "When was your last menstrual period?"     No  Protocols used: Blood Pressure - High-A-AH

## 2023-11-25 NOTE — Telephone Encounter (Signed)
 Pt called back to see different provider from earlier triage today. Pt transferred to [facility] by agent. No triage given.        Copied from CRM [id number]. Topic: Clinical - Red Word Triage >> Nov 25, 2023 10:21 AM Alethia Huxley E wrote: [facility] that prompted transfer to Nurse Triage: Fluctuation of blood pressure. Patient stated her blood pressure last night was 91/63, and this morning it was 176/113. Reason for Disposition  General information question, no triage required and triager able to answer question  Answer Assessment - Initial Assessment Questions 1. REASON FOR CALL or QUESTION: "What is your reason for calling today?" or "How can I best help you?" or "What question do you have that I can help answer?"     Agent called back due to pt changing mind about seeing different provider.  Protocols used: Information Only Call - No Triage-A-AH

## 2023-11-26 ENCOUNTER — Encounter: Payer: Self-pay | Admitting: Physician Assistant

## 2023-11-26 ENCOUNTER — Ambulatory Visit: Admitting: Physician Assistant

## 2023-11-26 VITALS — BP 170/100 | HR 71 | Temp 98.6°F | Ht 65.0 in

## 2023-11-26 DIAGNOSIS — R5383 Other fatigue: Secondary | ICD-10-CM | POA: Diagnosis not present

## 2023-11-26 DIAGNOSIS — E063 Autoimmune thyroiditis: Secondary | ICD-10-CM

## 2023-11-26 DIAGNOSIS — R5381 Other malaise: Secondary | ICD-10-CM | POA: Diagnosis not present

## 2023-11-26 DIAGNOSIS — R350 Frequency of micturition: Secondary | ICD-10-CM | POA: Diagnosis not present

## 2023-11-26 LAB — CBC WITH DIFFERENTIAL/PLATELET
Basophils Absolute: 0 10*3/uL (ref 0.0–0.1)
Basophils Relative: 0.3 % (ref 0.0–3.0)
Eosinophils Absolute: 0.1 10*3/uL (ref 0.0–0.7)
Eosinophils Relative: 0.8 % (ref 0.0–5.0)
HCT: 41.2 % (ref 36.0–46.0)
Hemoglobin: 13.7 g/dL (ref 12.0–15.0)
Lymphocytes Relative: 41.2 % (ref 12.0–46.0)
Lymphs Abs: 2.9 10*3/uL (ref 0.7–4.0)
MCHC: 33.4 g/dL (ref 30.0–36.0)
MCV: 98.9 fl (ref 78.0–100.0)
Monocytes Absolute: 0.5 10*3/uL (ref 0.1–1.0)
Monocytes Relative: 7 % (ref 3.0–12.0)
Neutro Abs: 3.5 10*3/uL (ref 1.4–7.7)
Neutrophils Relative %: 50.7 % (ref 43.0–77.0)
Platelets: 236 10*3/uL (ref 150.0–400.0)
RBC: 4.16 Mil/uL (ref 3.87–5.11)
RDW: 14.1 % (ref 11.5–15.5)
WBC: 7 10*3/uL (ref 4.0–10.5)

## 2023-11-26 LAB — URINALYSIS, ROUTINE W REFLEX MICROSCOPIC
Bilirubin Urine: NEGATIVE
Ketones, ur: NEGATIVE
Nitrite: POSITIVE — AB
Specific Gravity, Urine: 1.015 (ref 1.000–1.030)
Total Protein, Urine: NEGATIVE
Urine Glucose: NEGATIVE
Urobilinogen, UA: 0.2 (ref 0.0–1.0)
pH: 6 (ref 5.0–8.0)

## 2023-11-26 LAB — COMPREHENSIVE METABOLIC PANEL WITH GFR
ALT: 29 U/L (ref 0–35)
AST: 37 U/L (ref 0–37)
Albumin: 4.1 g/dL (ref 3.5–5.2)
Alkaline Phosphatase: 54 U/L (ref 39–117)
BUN: 27 mg/dL — ABNORMAL HIGH (ref 6–23)
CO2: 30 meq/L (ref 19–32)
Calcium: 10.8 mg/dL — ABNORMAL HIGH (ref 8.4–10.5)
Chloride: 97 meq/L (ref 96–112)
Creatinine, Ser: 0.31 mg/dL — ABNORMAL LOW (ref 0.40–1.20)
GFR: 93.71 mL/min (ref >60.00)
Glucose, Bld: 89 mg/dL (ref 70–99)
Potassium: 4.4 meq/L (ref 3.5–5.1)
Sodium: 134 meq/L — ABNORMAL LOW (ref 135–145)
Total Bilirubin: 0.6 mg/dL (ref 0.2–1.2)
Total Protein: 8.6 g/dL — ABNORMAL HIGH (ref 6.0–8.3)

## 2023-11-26 LAB — TSH: TSH: 7.68 u[IU]/mL — ABNORMAL HIGH (ref 0.35–5.50)

## 2023-11-26 MED ORDER — AMOXICILLIN 875 MG PO TABS: 875.0000 mg | ORAL_TABLET | Freq: Two times a day (BID) | ORAL | 0 refills | Status: DC

## 2023-11-26 NOTE — Progress Notes (Signed)
 Deanna Rivera is a 89 y.o. female here for a follow up of a pre-existing problem.  History of Present Illness:   Chief Complaint  Patient presents with   Hypertension   Fatigue    Pt is c/o feeling weak and not feeling well.   Pt is accompanied by her husband.   Hypertension: She reports she is only taking 12.5 mg of Spironolactone once daily every other day and taking Toprol-XL as needed.  She has not been taking Eliquis.  She monitors her blood pressures at home and reports her readings have been fluctuating.  She reports graduating from the heart failure clinic.  Per pt, when she was seeking evaluation from the heart failure clinic for the fluctuating blood pressures, she was advised to follow up with her PCP instead.  Denies any chest pain or shortness of breath.   Fatigue // Hypothyroidism: Pt complains of recent fatigue and weakness.  She reports she has not felt like herself lately.  She currently manages her hypothyroidism with Synthroid 112 mcg once daily.  Reports good compliance and tolerance to her current medication regimen.   Her husband states she has not eating as much as she much food as should.  Pt remains in her wheel chair and does not walk.  Denies any cough or recent falls.   Urinary frequency: Pt complains of urinary frequency, which began about 1 month ago following episodes of gastroenteritis and diarrhea.  She was seen by Dr. Jonelle Neri on 11/06/23 for these gastritis episodes.  Since then, she has noticed her urinary output exceeds her fluid intake.  Pt is agreeable to starting antibiotics.  No hx of recurrent UTIs.   Past Medical History:  Diagnosis Date   Cataract    Hypertension    Moderate aortic stenosis    Moderate mitral regurgitation    Persistent atrial fibrillation (HCC)    PMR (polymyalgia rheumatica) (HCC)    Second degree AV block    Sick sinus syndrome (HCC)    Thyroid disease      Social History   Tobacco Use   Smoking status:  Former    Types: Cigarettes    Passive exposure: Past   Smokeless tobacco: Never  Vaping Use   Vaping status: Never Used  Substance Use Topics   Alcohol use: Not Currently   Drug use: Not Currently    Past Surgical History:  Procedure Laterality Date   ABDOMINAL HYSTERECTOMY     APPENDECTOMY     CESAREAN SECTION     x 3   CHOLECYSTECTOMY     PACEMAKER GENERATOR CHANGE  11/20/2018   [employer] Accolade MRI PPM implanted in [place]    PACEMAKER IMPLANT     PILONIDAL CYST EXCISION      Family History  Problem Relation Age of Onset   Early death Mother 9   Rheumatic fever Mother    Heart disease Mother        valve replacement   Cirrhosis Father 56   Hypertension Father    Hypertension Brother    Lung cancer Brother    Hypertension Brother    Other Brother        smoker   Scleroderma Daughter    Hypertension Son    Hypertension Son     Allergies  Allergen Reactions   Shellfish-Derived Products Shortness Of Breath and Other (See Comments)    Lips go numb   Epinephrine Other (See Comments) and Hypertension    "Makes me feel wired"  Monosodium Glutamate Other (See Comments)    other   Monosodium Glutamate Hypertension and Other (See Comments)    "Makes me feel badly"  Other Reaction(s): Other (See Comments)  other, other   Ciprofloxacin Other (See Comments)    Allergic reaction not recalled  Other Reaction(s): Other (See Comments)  Other reaction(s): Intolerance   Dronedarone     Other Reaction(s): Other (See Comments)  Leg swelling, Leg swelling   Erythromycin Other (See Comments)    Allergic reaction not recalled   Sulfa Antibiotics Other (See Comments)    Allergic reaction not recalled   Sulfamethoxazole-Trimethoprim    Tetracyclines & Related Other (See Comments)    Allergic reaction not recalled    Current Medications:   Current Outpatient Medications:    acetaminophen (TYLENOL) 500 MG tablet, Take 500 mg by mouth every 6 (six)  hours as needed for mild pain or headache., Disp: , Rfl:    digoxin (LANOXIN) 0.125 MG tablet, TAKE 1 TABLET BY MOUTH DAILY, Disp: 90 tablet, Rfl: 2   dorzolamide-timolol (COSOPT) 22.3-6.8 MG/ML ophthalmic solution, Place 1 drop into both eyes 2 (two) times daily., Disp: , Rfl:    ELIQUIS 2.5 MG TABS tablet, TAKE 1 TABLET BY MOUTH TWICE  DAILY, Disp: 60 tablet, Rfl: 11   latanoprost (XALATAN) 0.005 % ophthalmic solution, Place 1 drop into both eyes at bedtime., Disp: , Rfl:    levothyroxine (SYNTHROID) 112 MCG tablet, TAKE 1 TABLET BY MOUTH DAILY  BEFORE BREAKFAST, Disp: 90 tablet, Rfl: 3   metoprolol succinate (TOPROL-XL) 50 MG 24 hr tablet, TAKE 1 TABLET BY MOUTH DAILY (Patient taking differently: Taking as needed), Disp: 90 tablet, Rfl: 3   polyethylene glycol (MIRALAX / GLYCOLAX) 17 g packet, Take 17 g by mouth daily as needed for severe constipation., Disp: , Rfl:    predniSONE (DELTASONE) 1 MG tablet, Take 1 tablet (1 mg total) by mouth daily., Disp: 90 tablet, Rfl: 3   spironolactone (ALDACTONE) 25 MG tablet, Take 0.5 tablets (12.5 mg total) by mouth daily. (Patient taking differently: Take 12.5 mg by mouth every other day.), Disp: 45 tablet, Rfl: 3   furosemide (LASIX) 20 MG tablet, Take 1 tablet (20 mg total) by mouth daily. (Patient taking differently: Take 20 mg by mouth every other day. Pt is alternating with Spironalactone), Disp: 90 tablet, Rfl: 3   Review of Systems:   Negative unless otherwise specified per HPI.  Vitals:   There were no vitals filed for this visit.   There is no height or weight on file to calculate BMI.  Physical Exam:   Physical Exam  Assessment and Plan:   There are no diagnoses linked to this encounter.   I, Bernita Bristle, acting as a Neurosurgeon for Alexander Iba, [place]., have documented all relevant documentation on the behalf of Alexander Iba, [place], as directed by  Alexander Iba, PA while in the presence of Alexander Iba, [place].  I, Bernita Bristle,  have reviewed all documentation for this visit. The documentation on 11/26/23 for the exam, diagnosis, procedures, and orders are all accurate and complete.  Alexander Iba, PA-C

## 2023-11-26 NOTE — Patient Instructions (Signed)
 It was great to see you!  We will be in touch results  If any worsening, GO TO THE ER  Take care,  Sebastian Lurz PA-C

## 2023-11-27 ENCOUNTER — Encounter: Payer: Self-pay | Admitting: Physician Assistant

## 2023-11-27 MED ORDER — AMOXICILLIN-POT CLAVULANATE 875-125 MG PO TABS: 1.0000 | ORAL_TABLET | Freq: Two times a day (BID) | ORAL | 0 refills | Status: DC

## 2023-11-27 MED ORDER — AMOXICILLIN-POT CLAVULANATE 400-57 MG/5ML PO SUSR: 875.0000 mg | Freq: Two times a day (BID) | ORAL | 0 refills | Status: DC

## 2023-11-27 MED ORDER — LEVOTHYROXINE SODIUM 125 MCG PO TABS: 125.0000 ug | ORAL_TABLET | Freq: Every day | ORAL | 1 refills | Status: DC

## 2023-11-27 NOTE — Telephone Encounter (Signed)
 Patient would like a call back from the dr she has a swallowing problem as well

## 2023-11-27 NOTE — Telephone Encounter (Signed)
 Noted.

## 2023-11-27 NOTE — Addendum Note (Signed)
 Addended by: Dalores Weger J on: 11/27/2023 02:57 PM   Modules accepted: Orders

## 2023-11-28 ENCOUNTER — Other Ambulatory Visit: Payer: Self-pay | Admitting: Physician Assistant

## 2023-11-28 LAB — URINE CULTURE
MICRO NUMBER:: 16331929
SPECIMEN QUALITY:: ADEQUATE

## 2023-11-28 MED ORDER — CEPHALEXIN 250 MG/5ML PO SUSR: 500.0000 mg | Freq: Two times a day (BID) | ORAL | 0 refills | Status: DC

## 2023-12-03 ENCOUNTER — Ambulatory Visit: Admitting: Family Medicine

## 2023-12-04 ENCOUNTER — Ambulatory Visit (INDEPENDENT_AMBULATORY_CARE_PROVIDER_SITE_OTHER): Admitting: Family Medicine

## 2023-12-04 ENCOUNTER — Encounter: Payer: Self-pay | Admitting: Family Medicine

## 2023-12-04 VITALS — BP 130/71 | HR 59 | Temp 98.0°F | Ht 65.0 in | Wt 114.0 lb

## 2023-12-04 DIAGNOSIS — G7241 Inclusion body myositis [IBM]: Secondary | ICD-10-CM

## 2023-12-04 DIAGNOSIS — M353 Polymyalgia rheumatica: Secondary | ICD-10-CM

## 2023-12-04 DIAGNOSIS — I5032 Chronic diastolic (congestive) heart failure: Secondary | ICD-10-CM | POA: Diagnosis not present

## 2023-12-04 DIAGNOSIS — I4819 Other persistent atrial fibrillation: Secondary | ICD-10-CM

## 2023-12-04 DIAGNOSIS — R600 Localized edema: Secondary | ICD-10-CM

## 2023-12-04 DIAGNOSIS — E063 Autoimmune thyroiditis: Secondary | ICD-10-CM | POA: Diagnosis not present

## 2023-12-04 DIAGNOSIS — I10 Essential (primary) hypertension: Secondary | ICD-10-CM | POA: Diagnosis not present

## 2023-12-04 DIAGNOSIS — I272 Pulmonary hypertension, unspecified: Secondary | ICD-10-CM

## 2023-12-04 MED ORDER — FUROSEMIDE 20 MG PO TABS
20.0000 mg | ORAL_TABLET | Freq: Every day | ORAL | Status: DC | PRN
Start: 2023-12-04 — End: 2024-01-07

## 2023-12-04 MED ORDER — METOPROLOL SUCCINATE ER 25 MG PO TB24: 12.5000 mg | ORAL_TABLET | Freq: Every day | ORAL | 3 refills | Status: DC

## 2023-12-04 NOTE — Progress Notes (Signed)
 Subjective  CC:  Chief Complaint  Patient presents with   Hypertension    Pt stated that she has been experiencing some high/low bp    HPI: Deanna Rivera is a 89 y.o. female who presents to the office today to address the problems listed above in the chief complaint. Discussed the use of AI scribe software for clinical note transcription with the patient, who gave verbal consent to proceed.  History of Present Illness   Deanna Rivera is an 89 year old female with atrial fibrillation and hypertension who presents with concerns about blood pressure fluctuations and medication management.  She has been feeling generally unwell for a long time, with a recent exacerbation of symptoms following a diarrheal illness. During this period, she experienced dehydration, which she believes affected her blood pressure and electrolyte levels. Her sodium was slightly below normal at 134 mmol/L, and she feels worse since the diarrheal episode. No current diarrhea, but she feels tired and has episodes of feeling unwell.  She has a history of atrial fibrillation and is concerned about her heart rate, which she notes should be regulated by her pacemaker. Her heart rate has been in the 70s to 80s, which she describes as 'perfectly normal,' but she is worried about it going too high. She experiences occasional 'thinking feeling' episodes, which she finds difficult to describe.  She manages her blood pressure with metoprolol  but reports variability in her blood pressure readings, ranging from 90/64 to 157/80. She has been hesitant to take metoprolol  regularly due to concerns about low blood pressure, especially during her recent illness. She mentions taking metoprolol  inconsistently, sometimes a quarter of a 50 mg pill, and is unsure about the appropriate dosing.  She reports significant urination, which she attributes to her diuretic use, including Lasix  and spironolactone . She has been alternating these  medications due to concerns about excessive urination and leg swelling. She notes that she cannot take both medications simultaneously due to the increased frequency of urination.  She mentions a history of thyroid  issues, with a recent adjustment in her thyroid  medication dose. She felt 'wired' after taking the adjusted dose for a few days and has since stopped it. She is unsure about the current status of her thyroid  function.  She has a corn on her toe that was treated by a foot doctor, who cut the corn off. It is not completely healed but is improving. She did not return for a follow-up as she felt it was unnecessary.       Assessment  No diagnosis found.   Plan  Assessment and Plan    Hypertension Blood pressure fluctuates between 90/64 and 157/80 with lightheadedness. Management complicated by recent dehydration and diarrheal illness. Focus on maintaining safe blood pressure range without daily medication adjustments. - Prescribe metoprolol  12.5 mg daily. - Use Lasix  as needed for leg swelling. - Avoid frequent blood pressure monitoring; focus on symptoms like chest pain or dyspnea.  Atrial fibrillation Intermittent atrial fibrillation with heart rate generally below 100 bpm. Metoprolol  recommended to maintain steady heart rate. - Prescribe metoprolol  12.5 mg daily.  Leg swelling Swelling likely due to reduced mobility and recent dehydration. Difficulty managing diuretics due to increased urination. - Use Lasix  as needed. - Consider palliative care or hospice consult for support.  Hypothyroidism Thyroid  levels previously abnormal; dose adjusted. Reports feeling 'wired' after dose change. Plan to recheck thyroid  function in a month. - Continue current thyroid  medication at lower dose. - Recheck thyroid  function in 3-4 weeks.  Urinary tract infection Currently taking medication for infection.  Dehydration Recent dehydration likely due to resolved diarrheal illness,  contributing to blood pressure fluctuations. - Encourage adequate fluid intake.  Goals of Care Discussed palliative care and hospice. Clarified hospice is for end-of-life care, while palliative care provides support without prognosis restrictions. - Consider palliative care or hospice consult for support and to address concerns about being a burden.  Follow-up Follow-up necessary to monitor thyroid  function and overall health. Re-evaluation of blood pressure management planned. - Schedule follow-up appointment in 3-4 weeks to recheck thyroid  levels and assess blood pressure management.        No orders of the defined types were placed in this encounter.  Meds ordered this encounter  Medications   metoprolol  succinate (TOPROL -XL) 25 MG 24 hr tablet    Sig: Take 0.5 tablets (12.5 mg total) by mouth daily.    Dispense:  45 tablet    Refill:  3   furosemide  (LASIX ) 20 MG tablet    Sig: Take 1 tablet (20 mg total) by mouth daily as needed.     I reviewed the patients updated PMH, FH, and SocHx.    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    Priority: High   Nontoxic multinodular goiter 09/08/2015    Priority: High   Current use of long term anticoagulation 05/16/2015    Priority: High   (HFpEF) heart failure with preserved ejection fraction (HCC) 09/13/2014    Priority: High   Pacemaker 11/09/2011    Priority: High   Impaired fasting glucose 05/23/2009    Priority: High   Long term current use of systemic steroids 03/30/2008    Priority: High   Polymyalgia  rheumatica (HCC) 03/30/2008    Priority: High   Benign essential hypertension 05/05/2007    Priority: High   Persistent atrial fibrillation (HCC) 11/06/2006    Priority: High   Pure hypercholesterolemia 11/06/2006    Priority: High   Hypothyroidism due to Hashimoto's thyroiditis, Rx Levothyroxine  06/25/2005    Priority: High   Degenerative lumbar spinal stenosis 05/30/2022    Priority: Medium    Compression fracture of body of thoracic vertebra (HCC) 02/06/2021    Priority: Medium    Primary osteoarthritis of right knee 10/23/2019    Priority: Medium    Nonrheumatic mitral valve regurgitation 09/16/2015    Priority: Medium    Recurrent UTI 03/14/2015    Priority: Medium    Sensorineural hearing loss (SNHL) of both ears 01/14/2022    Priority: Low   Neuralgia, postherpetic 07/02/2019    Priority: Low   OAB (overactive bladder) 07/02/2019    Priority: Low   Constipation 01/18/2019    Priority: Low   Protein-calorie malnutrition, severe 11/06/2022   Frequent falls 05/17/2021   Current Meds  Medication Sig   metoprolol  succinate (TOPROL -XL) 25 MG 24 hr tablet Take 0.5 tablets (12.5 mg total) by mouth daily.    Allergies: Patient is allergic to shellfish-derived products, epinephrine , monosodium glutamate, monosodium glutamate, ciprofloxacin, dronedarone, erythromycin, sulfa antibiotics, sulfamethoxazole-trimethoprim, and tetracyclines & related. Family History: Patient  family history includes Cirrhosis (age of onset: 35) in her father; Early death (age of onset: 69) in her mother; Heart disease in her mother; Hypertension in her brother, brother, father, son, and son; Lung cancer in her brother; Other in her brother; Rheumatic fever in her mother; Scleroderma in her daughter. Social History:  Patient  reports that she has quit smoking. Her smoking use included cigarettes. She has been exposed to tobacco smoke. She has never used smokeless tobacco. She reports that she does not  currently use alcohol. She reports that she does not currently use drugs.  Review of Systems: Constitutional: Negative for fever malaise or anorexia Cardiovascular: negative for chest pain Respiratory: negative for SOB or persistent cough Gastrointestinal: negative for abdominal pain  Objective  Vitals: BP 130/71   Pulse (!) 59   Temp 98 F (36.7 C)   Ht 5\' 5"  (1.651 m)   Wt 114 lb (51.7 kg) Comment: pt reported  SpO2 98%   BMI 18.97 kg/m  General: no acute distress , A&Ox3, in wheel chair Anxious today HEENT: PEERL, conjunctiva normal, neck is supple Cardiovascular:  RRR without murmur or gallop.  Respiratory:  Good breath sounds bilaterally, CTAB with normal respiratory effort Skin:  + edema  Commons side effects, risks, benefits, and alternatives for medications and treatment plan prescribed today were discussed, and the patient expressed understanding of the given instructions. Patient is instructed to call or message via MyChart if he/she has any questions or concerns regarding our treatment plan. No barriers to understanding were identified. We discussed Red Flag symptoms and signs in detail. Patient expressed understanding regarding what to do in case of urgent or emergency type symptoms.  Medication list was reconciled, printed and provided to the patient in AVS. Patient instructions and summary information was reviewed with the patient as documented in the AVS. This note was prepared with assistance of Dragon voice recognition software. Occasional wrong-word or sound-a-like substitutions may have occurred due to the inherent limitations of voice recognition software

## 2023-12-04 NOTE — Patient Instructions (Signed)
 Please follow up if symptoms do not improve or as needed.    VISIT SUMMARY:  During your visit, we discussed your concerns about blood pressure fluctuations, medication management, and overall well-being. We reviewed your history of atrial fibrillation, hypertension, recent dehydration, and thyroid  issues. We also addressed your leg swelling, urinary tract infection, and recent diarrheal illness.  YOUR PLAN:  -HYPERTENSION: Hypertension means high blood pressure. To manage your blood pressure, you should take metoprolol  12.5 mg daily. Use Lasix  as needed for leg swelling, and avoid frequent blood pressure monitoring. Focus on symptoms like chest pain or shortness of breath instead.  -ATRIAL FIBRILLATION: Atrial fibrillation is an irregular and often rapid heart rate. To help maintain a steady heart rate, you should take metoprolol  12.5 mg daily.  -LEG SWELLING: Leg swelling can be due to reduced mobility and recent dehydration. Use Lasix  as needed to manage the swelling. We may also consider a palliative care or hospice consult for additional support.  -HYPOTHYROIDISM: Hypothyroidism means your thyroid  is underactive. Continue your current thyroid  medication at a lower dose, and we will recheck your thyroid  function in 3-4 weeks.  -URINARY TRACT INFECTION: You are currently taking medication for a urinary tract infection. Continue with the prescribed treatment.  -DEHYDRATION: Dehydration means your body does not have enough fluids. This likely contributed to your blood pressure fluctuations. Make sure to drink plenty of fluids.  -GOALS OF CARE: We discussed the options of palliative care and hospice. Palliative care provides support without prognosis restrictions, while hospice is for end-of-life care. We may consider a consult for additional support.  INSTRUCTIONS:  Please schedule a follow-up appointment in 3-4 weeks to recheck your thyroid  levels and assess your blood pressure  management.

## 2023-12-05 ENCOUNTER — Ambulatory Visit: Admitting: Family Medicine

## 2023-12-10 ENCOUNTER — Ambulatory Visit: Admitting: Family Medicine

## 2023-12-18 ENCOUNTER — Ambulatory Visit: Admitting: Family Medicine

## 2023-12-19 NOTE — Addendum Note (Signed)
 Addended by: Lott Rouleau A on: 12/19/2023 01:19 PM   Modules accepted: Orders

## 2023-12-19 NOTE — Progress Notes (Signed)
 Remote pacemaker transmission.

## 2023-12-24 ENCOUNTER — Ambulatory Visit: Payer: Medicare Other | Admitting: Family Medicine

## 2023-12-24 ENCOUNTER — Encounter: Payer: Self-pay | Admitting: Family Medicine

## 2023-12-24 ENCOUNTER — Ambulatory Visit: Payer: Self-pay | Admitting: Family Medicine

## 2023-12-24 ENCOUNTER — Ambulatory Visit (INDEPENDENT_AMBULATORY_CARE_PROVIDER_SITE_OTHER): Admitting: Family Medicine

## 2023-12-24 VITALS — BP 130/66 | HR 76 | Temp 98.2°F | Ht 65.0 in | Wt 114.5 lb

## 2023-12-24 DIAGNOSIS — I5032 Chronic diastolic (congestive) heart failure: Secondary | ICD-10-CM

## 2023-12-24 DIAGNOSIS — H6121 Impacted cerumen, right ear: Secondary | ICD-10-CM | POA: Diagnosis not present

## 2023-12-24 DIAGNOSIS — R600 Localized edema: Secondary | ICD-10-CM | POA: Diagnosis not present

## 2023-12-24 DIAGNOSIS — I10 Essential (primary) hypertension: Secondary | ICD-10-CM

## 2023-12-24 DIAGNOSIS — R8290 Unspecified abnormal findings in urine: Secondary | ICD-10-CM

## 2023-12-24 DIAGNOSIS — E063 Autoimmune thyroiditis: Secondary | ICD-10-CM | POA: Diagnosis not present

## 2023-12-24 LAB — BASIC METABOLIC PANEL WITH GFR
BUN: 19 mg/dL (ref 6–23)
CO2: 32 meq/L (ref 19–32)
Calcium: 10.2 mg/dL (ref 8.4–10.5)
Chloride: 98 meq/L (ref 96–112)
Creatinine, Ser: 0.27 mg/dL — ABNORMAL LOW (ref 0.40–1.20)
GFR: 96.83 mL/min (ref >60.00)
Glucose, Bld: 93 mg/dL (ref 70–99)
Potassium: 4.1 meq/L (ref 3.5–5.1)
Sodium: 136 meq/L (ref 135–145)

## 2023-12-24 LAB — URINALYSIS, ROUTINE W REFLEX MICROSCOPIC
Bilirubin Urine: NEGATIVE
Ketones, ur: NEGATIVE
Nitrite: POSITIVE — AB
Specific Gravity, Urine: 1.01 (ref 1.000–1.030)
Total Protein, Urine: NEGATIVE
Urine Glucose: NEGATIVE
Urobilinogen, UA: 1 (ref 0.0–1.0)
pH: 7 (ref 5.0–8.0)

## 2023-12-24 LAB — TSH: TSH: 4.46 u[IU]/mL (ref 0.35–5.50)

## 2023-12-24 NOTE — Progress Notes (Signed)
 Please call patient:Please let Deanna Rivera know that her electolytes are all in the normal range. However, urine looks infected again. I'd like to wait for the culture results to choose the correct antibiotic: please have her call us  if she develops any urinary symptoms in the meantime. I should have the culture results in 2 days.  And please refer her to ent, Dr. Darlin Ehrlich for cerumen impaction. thanks

## 2023-12-24 NOTE — Progress Notes (Signed)
 Subjective  CC:  Chief Complaint  Patient presents with   Cerumen Impaction    HPI: Deanna Rivera is a 89 y.o. female who presents to the office today to address the problems listed above in the chief complaint. Discussed the use of AI scribe software for clinical note transcription with the patient, who gave verbal consent to proceed.  History of Present Illness Deanna Rivera is an 89 year old female who presents with hearing difficulties and ear wax impaction.  She experiences difficulty hearing, which she attributes to ear wax impaction. Despite being informed at [facility] that her hearing aids were fixed, she continues to have hearing issues. She is particularly concerned about her ability to hear at a family wedding on Saturday.  She reports swelling in her legs despite taking furosemide  daily. She experiences nocturnal enuresis, stating she wets the bed every night and wakes up soaking wet. She does not feel the urge to urinate at night but manages to go to the bathroom during the day. No burning sensation with urination and no typical symptoms of a bladder infection, though she notes a change in the smell of her urine over the past few weeks.  She describes waking up at night with a need to spit up saliva. A therapist mentioned there is a pill for this, but she is hesitant to take more medication.  She experiences significant back and neck pain at night, which affects her sleep. She has tried various pillows without relief. She does not currently take any medication for pain but is open to trying liquid Tylenol  as she cannot crush pills.  She is concerned about her electrolytes and mentions a need for testing. She also reports an increase in atrial symptoms. She is currently taking her blood pressure medication as prescribed.   Assessment  1. Hypothyroidism due to Hashimoto's thyroiditis, Rx Levothyroxine    2. Benign essential hypertension   3. Chronic heart failure with  preserved ejection fraction (HCC)   4. Bilateral lower extremity edema   5. Abnormal urine odor      Plan  Assessment and Plan Assessment & Plan Leg swelling Persistent leg swelling despite furosemide , causing discomfort and nocturnal enuresis. Elevate, ace wrap (can't use compression stockings)  Hypothyroidism: 4 weeks since last check but recheck early due to patient's anxiety and concern about dosing.   Urinary incontinence Likely related to leg swelling and diuretic use, with reports of being soaking wet every morning and no urge to urinate at night.  Atrial fibrillation Experiencing more frequent episodes, including one this morning.  Urinary symptoms with smelly urine Smelly urine for weeks without dysuria. Differential includes infection, dehydration, or dietary causes. - Obtain urine sample for analysis.  Reflux and swallowing problem Nocturnal saliva regurgitation likely due to reflux and swallowing difficulties, identified as chronic.  Neck and back pain Chronic pain, particularly at night, affecting sleep. Avoids pain medication due to difficulty swallowing pills. - Recommend childrens liquid acetaminophen , up to five teaspoons, for pain management.  Hearing loss due to earwax impaction Hearing loss due to hard, deep earwax impaction, particularly in the left ear. - Attempt earwax removal in the office.  Procedure: Cerumen Disimpaction Pt gave verbal consent to irrigation and manual cerumen impaction. The patient had a large amount of cerumen in the external auditory canal(s): right Ear wax softener was not used prior to the lavage. Curettage by provider was performed to remove wax from right ear with curette There were no complications and following the disimpaction  the tympanic membrane was visible. Patient tolerated the procedure well.   Concern about electrolyte imbalance Concern about potential electrolyte imbalance without specific symptoms. - Order  electrolyte panel.    Follow up: 3 mo for recheck Orders Placed This Encounter  Procedures   Urine Culture   Basic metabolic panel with GFR   TSH   Urinalysis, Routine w reflex microscopic   No orders of the defined types were placed in this encounter.    I reviewed the patients updated PMH, FH, and SocHx.  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    Priority: High   Nontoxic multinodular goiter 09/08/2015    Priority: High   Current use of long term anticoagulation 05/16/2015    Priority: High   (HFpEF) heart failure with preserved ejection fraction (HCC) 09/13/2014    Priority: High   Pacemaker 11/09/2011    Priority: High   Impaired fasting glucose 05/23/2009    Priority: High   Long term current use of systemic steroids 03/30/2008    Priority: High   Polymyalgia rheumatica (HCC) 03/30/2008    Priority: High   Benign essential hypertension 05/05/2007    Priority: High   Persistent atrial fibrillation (HCC) 11/06/2006    Priority: High   Pure hypercholesterolemia 11/06/2006    Priority: High   Hypothyroidism due to Hashimoto's thyroiditis, Rx Levothyroxine  06/25/2005    Priority: High   Degenerative lumbar spinal stenosis 05/30/2022    Priority: Medium    Compression fracture of body of thoracic vertebra (HCC) 02/06/2021    Priority: Medium    Primary osteoarthritis of right knee 10/23/2019    Priority: Medium    Nonrheumatic mitral valve regurgitation 09/16/2015    Priority: Medium    Recurrent UTI 03/14/2015    Priority: Medium    Sensorineural  hearing loss (SNHL) of both ears 01/14/2022    Priority: Low   Neuralgia, postherpetic 07/02/2019    Priority: Low   OAB (overactive bladder) 07/02/2019    Priority: Low   Constipation 01/18/2019    Priority: Low   Protein-calorie malnutrition, severe 11/06/2022   Frequent falls 05/17/2021   Current Meds  Medication Sig   acetaminophen  (TYLENOL ) 500 MG tablet Take 500 mg by mouth every 6 (six) hours as needed for mild pain or headache.   digoxin  (LANOXIN ) 0.125 MG tablet TAKE 1 TABLET BY MOUTH DAILY   dorzolamide-timolol (COSOPT) 22.3-6.8 MG/ML ophthalmic solution Place 1 drop into both eyes 2 (two) times daily.   ELIQUIS  2.5 MG TABS tablet TAKE 1 TABLET BY MOUTH TWICE  DAILY   furosemide  (LASIX ) 20 MG tablet Take 1 tablet (20 mg total) by mouth daily as needed.   latanoprost (XALATAN) 0.005 % ophthalmic solution Place 1 drop into both eyes at bedtime.   levothyroxine  (SYNTHROID ) 125 MCG tablet Take 1 tablet (125 mcg total) by mouth daily.   metoprolol  succinate (TOPROL -XL) 25 MG 24 hr tablet Take 0.5 tablets (12.5 mg total) by mouth daily.   polyethylene glycol (MIRALAX  / GLYCOLAX ) 17 g packet Take 17 g by mouth daily as needed for  severe constipation.   predniSONE  (DELTASONE ) 1 MG tablet Take 1 tablet (1 mg total) by mouth daily.   Allergies: Patient is allergic to shellfish-derived products, epinephrine , monosodium glutamate, monosodium glutamate, ciprofloxacin, dronedarone, erythromycin, sulfa antibiotics, sulfamethoxazole-trimethoprim, and tetracyclines & related. Family History: Patient family history includes Cirrhosis (age of onset: 63) in her father; Early death (age of onset: 57) in her mother; Heart disease in her mother; Hypertension in her brother, brother, father, son, and son; Lung cancer in her brother; Other in her brother; Rheumatic fever in her mother; Scleroderma in her daughter. Social History:  Patient  reports that she has quit smoking. Her smoking use included  cigarettes. She has been exposed to tobacco smoke. She has never used smokeless tobacco. She reports that she does not currently use alcohol. She reports that she does not currently use drugs.  Review of Systems: Constitutional: Negative for fever malaise or anorexia Cardiovascular: negative for chest pain Respiratory: negative for SOB or persistent cough Gastrointestinal: negative for abdominal pain  Objective  Vitals: BP 130/66   Pulse 76   Temp 98.2 F (36.8 C)   Ht 5\' 5"  (1.651 m)   Wt 114 lb 8 oz (51.9 kg)   SpO2 98%   BMI 19.05 kg/m  General: no acute distress , A&Ox3 HEENT: PEERL, conjunctiva normal, neck is supple, right cerumen impaction partially cleared Cardiovascular:  RRR without murmur or gallop. +2 pitting edema Respiratory:  Good breath sounds bilaterally, CTAB with normal respiratory effort Skin:  Warm, no rashes Commons side effects, risks, benefits, and alternatives for medications and treatment plan prescribed today were discussed, and the patient expressed understanding of the given instructions. Patient is instructed to call or message via MyChart if he/she has any questions or concerns regarding our treatment plan. No barriers to understanding were identified. We discussed Red Flag symptoms and signs in detail. Patient expressed understanding regarding what to do in case of urgent or emergency type symptoms.  Medication list was reconciled, printed and provided to the patient in AVS. Patient instructions and summary information was reviewed with the patient as documented in the AVS. This note was prepared with assistance of Dragon voice recognition software. Occasional wrong-word or sound-a-like substitutions may have occurred due to the inherent limitations of voice recognition software

## 2023-12-25 ENCOUNTER — Ambulatory Visit: Admitting: Family Medicine

## 2023-12-26 ENCOUNTER — Other Ambulatory Visit: Payer: Self-pay

## 2023-12-26 DIAGNOSIS — H6120 Impacted cerumen, unspecified ear: Secondary | ICD-10-CM

## 2023-12-26 LAB — URINE CULTURE
MICRO NUMBER:: 16448910
SPECIMEN QUALITY:: ADEQUATE

## 2023-12-30 MED ORDER — CEFUROXIME AXETIL 250 MG PO TABS: 250.0000 mg | ORAL_TABLET | Freq: Two times a day (BID) | ORAL | 0 refills | Status: AC

## 2023-12-30 NOTE — Progress Notes (Signed)
 Please call patient:let her know I have ordered her antibiotics to take for her UTI: sent to [facility].

## 2024-01-07 ENCOUNTER — Other Ambulatory Visit: Payer: Self-pay | Admitting: Family Medicine

## 2024-01-07 ENCOUNTER — Telehealth: Payer: Self-pay

## 2024-01-07 ENCOUNTER — Other Ambulatory Visit (HOSPITAL_COMMUNITY): Payer: Self-pay | Admitting: Cardiology

## 2024-01-07 ENCOUNTER — Other Ambulatory Visit: Payer: Self-pay

## 2024-01-07 DIAGNOSIS — R001 Bradycardia, unspecified: Secondary | ICD-10-CM

## 2024-01-07 DIAGNOSIS — I272 Pulmonary hypertension, unspecified: Secondary | ICD-10-CM

## 2024-01-07 DIAGNOSIS — I5032 Chronic diastolic (congestive) heart failure: Secondary | ICD-10-CM

## 2024-01-07 DIAGNOSIS — I10 Essential (primary) hypertension: Secondary | ICD-10-CM

## 2024-01-07 DIAGNOSIS — I442 Atrioventricular block, complete: Secondary | ICD-10-CM

## 2024-01-07 MED ORDER — AMOXICILLIN-POT CLAVULANATE 250-62.5 MG/5ML PO SUSR: 500.0000 mg | Freq: Two times a day (BID) | ORAL | 0 refills | Status: AC

## 2024-01-07 NOTE — Telephone Encounter (Signed)
 Copied from CRM [id number]. Topic: Clinical - Prescription Issue >> Jan 07, 2024  8:38 AM Zipporah Him wrote: Reason for CRM: Patient is calling to let Dr Jonelle Neri know that her medication cefUROXime  (CEFTIN ) 250 MG tablet, patient states these are not working for her, she cannot swallow these and she needs something else.  Please Advise  Message has been sent to provider to address

## 2024-01-07 NOTE — Telephone Encounter (Unsigned)
 Copied from CRM [id number]. Topic: Clinical - Medication Refill >> Jan 07, 2024 12:29 PM Shereese L wrote: Medication: furosemide  (LASIX ) 20 MG tablet  Has the patient contacted their pharmacy? Yes (Agent: If no, request that the patient contact the pharmacy for the refill. If patient does not wish to contact the pharmacy document the reason why and proceed with request.) (Agent: If yes, when and what did the pharmacy advise?)  This is the patient's preferred pharmacy:    [facility] [id number] - [place] - [address] [place] [address] Phone: [phone] Fax: [phone]  Is this the correct pharmacy for this prescription? Yes If no, delete pharmacy and type the correct one.   Has the prescription been filled recently? No  Is the patient out of the medication? Yes  Has the patient been seen for an appointment in the last year OR does the patient have an upcoming appointment? Yes  Can we respond through MyChart? No  Agent: Please be advised that Rx refills may take up to 3 business days. We ask that you follow-up with your pharmacy.

## 2024-01-08 MED ORDER — FUROSEMIDE 20 MG PO TABS: 20.0000 mg | ORAL_TABLET | Freq: Every day | ORAL | 3 refills | Status: DC | PRN

## 2024-01-13 ENCOUNTER — Telehealth: Payer: Self-pay | Admitting: Internal Medicine

## 2024-01-13 ENCOUNTER — Telehealth: Payer: Self-pay | Admitting: Family Medicine

## 2024-01-13 NOTE — Telephone Encounter (Signed)
 Copied from CRM [id number]. Topic: Clinical - Medication Refill >> Jan 13, 2024  9:21 AM Earnestine Goes B wrote: Medication: furosemide  (LASIX ) 20 MG tablet  Has the patient contacted their pharmacy? Yes (Agent: If no, request that the patient contact the pharmacy for the refill. If patient does not wish to contact the pharmacy document the reason why and proceed with request.) (Agent: If yes, when and what did the pharmacy advise?)  This is the patient's preferred pharmacy:    [facility] [id number] - [place], [place] - [address] [place] [address] Phone: [phone] Fax: [phone]  Is this the correct pharmacy for this prescription? Yes If no, delete pharmacy and type the correct one.   Has the prescription been filled recently? Yes  Is the patient out of the medication? Yes  Has the patient been seen for an appointment in the last year OR does the patient have an upcoming appointment? Yes  Can we respond through MyChart? Yes  Agent: Please be advised that Rx refills may take up to 3 business days. We ask that you follow-up with your pharmacy.

## 2024-01-13 NOTE — Telephone Encounter (Signed)
 Per chart review, furosemide  was refilled on 5/28 by cardiology. There is no pharmacy associated with that refill and no receipt confirmation.  This RN called the pt's cardiologist's office. Staff at the office said it appears the refill has not been processed. RN gave to staff the name of pt's preferred pharmacy. Staff said they will relay the need to pt's cardiologist so that he can resend the medication. This RN gave staff a callback number at their request.  RN called pt to advise the pt that cardiology refilled the medication and staff is working on getting it sent to the pharmacy. Pt advised RN that a nurse at her cardiologist's office told her moving forward these medications should be refilled by her PCP.

## 2024-01-13 NOTE — Telephone Encounter (Signed)
*  STAT* If patient is at the pharmacy, call can be transferred to refill team.   1. Which medications need to be refilled? (please list name of each medication and dose if known)   furosemide  (LASIX ) 20 MG tablet    2. Which pharmacy/location (including street and city if local pharmacy) is medication to be sent to? [facility] [id number] - [place], [place] - [address]  3. Do they need a 30 day or 90 day supply?  90 day supply + 3 refills

## 2024-01-13 NOTE — Telephone Encounter (Signed)
 This is a CHF pt. Medication was sent in by CHF but medication had a end date on it. Please resend

## 2024-01-14 ENCOUNTER — Institutional Professional Consult (permissible substitution) (INDEPENDENT_AMBULATORY_CARE_PROVIDER_SITE_OTHER): Admitting: Physician Assistant

## 2024-01-14 MED ORDER — FUROSEMIDE 20 MG PO TABS: 20.0000 mg | ORAL_TABLET | Freq: Every day | ORAL | 2 refills | Status: DC | PRN

## 2024-01-14 NOTE — Telephone Encounter (Signed)
 Sent!

## 2024-01-14 NOTE — Telephone Encounter (Signed)
 This is a CHF pt and pt is calling back concerning this medication refill on furosemide . Would Dr. Carolynne Citron like to refill this medication? Please address

## 2024-01-14 NOTE — Telephone Encounter (Signed)
 Patient made aware.

## 2024-01-14 NOTE — Telephone Encounter (Signed)
*  STAT* If patient is at the pharmacy, call can be transferred to refill team.   1. Which medications need to be refilled? (please list name of each medication and dose if known)   furosemide  (LASIX ) 20 MG tablet   2. Would you like to learn more about the convenience, safety, & potential cost savings by using the [facility]?   3. Are you open to using the [facility] (Type [facility]. ).  4. Which pharmacy/location (including street and city if local pharmacy) is medication to be sent to?  [facility] [id number] - [place], [place] - [address]   5. Do they need a 30 day or 90 day supply?   90 day  Patient stated she only has 2 tablets left and wants prescription re-sent to [facility]. Patient has appointment scheduled on 9/16 with B. Ollis.

## 2024-01-14 NOTE — Telephone Encounter (Signed)
 Pt states she would need the medication by today or she's going to the emergency room. Please advise

## 2024-01-14 NOTE — Telephone Encounter (Signed)
 This is a CHF pt. Has pt been released from CHF? Please address

## 2024-01-14 NOTE — Telephone Encounter (Signed)
 Will route this to [facility] for further advisement on lasix  refill, for they have been managing this for the pt.

## 2024-01-15 NOTE — Telephone Encounter (Signed)
 I'm happy to refill lasix  when needed.

## 2024-01-16 ENCOUNTER — Other Ambulatory Visit: Payer: Self-pay

## 2024-01-16 NOTE — Telephone Encounter (Signed)
 This is a CHF pt

## 2024-01-21 ENCOUNTER — Ambulatory Visit (INDEPENDENT_AMBULATORY_CARE_PROVIDER_SITE_OTHER): Admitting: Physician Assistant

## 2024-01-21 ENCOUNTER — Encounter (INDEPENDENT_AMBULATORY_CARE_PROVIDER_SITE_OTHER): Payer: Self-pay | Admitting: Physician Assistant

## 2024-01-21 VITALS — BP 130/73 | HR 65 | Ht 64.0 in | Wt 115.0 lb

## 2024-01-21 DIAGNOSIS — R1310 Dysphagia, unspecified: Secondary | ICD-10-CM | POA: Diagnosis not present

## 2024-01-21 DIAGNOSIS — H6123 Impacted cerumen, bilateral: Secondary | ICD-10-CM | POA: Diagnosis not present

## 2024-01-21 NOTE — Progress Notes (Unsigned)
 Dear Dr. Jonelle Neri, Here is my assessment for our mutual patient, Deanna Rivera. Thank you for allowing me the opportunity to care for your patient. Please do not hesitate to contact me should you have any other questions. Sincerely, Belma Boxer PA-C  Otolaryngology Clinic Note Referring provider: Dr. Jonelle Neri HPI:  Deanna Rivera is a 89 y.o. female kindly referred by Dr. Jonelle Neri   The patient is an 89 year old female seen in our office for evaluation of cerumen impaction.  She notes a longstanding history of hearing loss.  She currently has hearing aids and is seen at [facility] for this.  She went to have them adjusted as she has had some decreased hearing.  She was noted to have bilateral cerumen impaction.  She went to her primary care provider who attempted irrigation but was unsuccessful.  Today she denies any significant pain, she denies any drainage, no fever.  No history of ear surgeries, she does report tonsillectomy when she was younger.  Patient also notes a longstanding history swallowing both liquids and solids with associated hoarseness.  Per chart review this has been addressed, swallow study ordered as well as CT of neck but the patient had not completed ire prior to her neurology visit on 11/2023. EMG was also recommended.   Chart review shows history of hypothyroidism due to Hashimoto's thyroiditis currently on levothyroxine .    Independent Review of Additional Tests or Records:  Patient was admitted from 11/03/22 to 11/07/22 for weakness in the setting of recent flu A infection. Per DC summary:  SLP evaluated and patient was taken for an MBS and she is noted to have mild to moderate primary pharyngeal dysphagia is likely chronic in nature and related to other degenerative changes and spurring in the cervical vertebra as well as generalized weakness and oral consent was largely functional. She had a markedly reduced laryngeal closure tongue base retraction and epiglottic movement of pharyngeal  esophageal segment opening. SLP feels that she has been intermittently aspirating for some time and overall weakness has been exacerbated her swallowing difficulty and SLP feels like her dysphagia will wax and wane and the adverse consequences of aspiration will recur at some point in the future  Recommendation for PEG tube was made but patient refused at that time    Swallow evaluation (11/05/22): Clinical Impression: Clinical Impression: Deanna Rivera presents with a mild-moderate primary pharyngeal dysphagia that is likely chronic in nature and related to degenerative changes and spurring in cervical vertebrae as well as generalized weakness.  Oral phase was largely functional.  Pharyngeal phase was marked by reduced laryngeal closure, tongue base retraction, epiglottic movement, and pharyngoesophageal segment opening.  These impairments in the swallow were partially due to appearance of osteophytes which inhibited full inversion of epiglottis over larynx.  There was a prominent CP, leading to material collecting above.  Reduced propulsion through the pharynx/PES was also related to inferred weakness of the base of tongue/pharyngeal wall. There was consistent penetration of thin liquids, often to the level of the vocal folds. Penetration elicited a frequent cough response; there was minimal aspiration.  Solid food residue tended to remain in the valleculae and was difficult to transition.  A chin tuck was trialed, it may have offered marginal benefit in bolus transfer of solids and airway protection with thin liquids. Spent time after the study reviewing imaging in pt's room and discussing results with pt/husband.  She should try tucking chin with all solids/liquids. Continue current diet (regular solids/thin liquids); Pt and husband will  need a lot of reinforcement to recall results and precautions. SLP will follow while admitted.  PMH/Meds/All/SocHx/FamHx/ROS:   Past Medical History:  Diagnosis Date    Cataract    Hypertension    Moderate aortic stenosis    Moderate mitral regurgitation    Persistent atrial fibrillation (HCC)    PMR (polymyalgia rheumatica) (HCC)    Second degree AV block    Sick sinus syndrome (HCC)    Thyroid  disease      Past Surgical History:  Procedure Laterality Date   ABDOMINAL HYSTERECTOMY     APPENDECTOMY     CESAREAN SECTION     x 3   CHOLECYSTECTOMY     PACEMAKER GENERATOR CHANGE  11/20/2018   [employer] Accolade MRI PPM implanted in [place]    PACEMAKER IMPLANT     PILONIDAL CYST EXCISION      Family History  Problem Relation Age of Onset   Early death Mother 38   Rheumatic fever Mother    Heart disease Mother        valve replacement   Cirrhosis Father 15   Hypertension Father    Hypertension Brother    Lung cancer Brother    Hypertension Brother    Other Brother        smoker   Scleroderma Daughter    Hypertension Son    Hypertension Son      Social Connections: Moderately Integrated (02/07/2023)   Social Connection and Isolation Panel [NHANES]    Frequency of Communication with Friends and Family: More than three times a week    Frequency of Social Gatherings with Friends and Family: More than three times a week    Attends Religious Services: More than 4 times per year    Active Member of [employer] or Organizations: No    Attends Engineer, structural: Never    Marital Status: Married      Current Outpatient Medications:    acetaminophen  (TYLENOL ) 500 MG tablet, Take 500 mg by mouth every 6 (six) hours as needed for mild pain or headache., Disp: , Rfl:    digoxin  (LANOXIN ) 0.125 MG tablet, TAKE 1 TABLET BY MOUTH DAILY, Disp: 90 tablet, Rfl: 2   dorzolamide-timolol (COSOPT) 22.3-6.8 MG/ML ophthalmic solution, Place 1 drop into both eyes 2 (two) times daily., Disp: , Rfl:    ELIQUIS  2.5 MG TABS tablet, TAKE 1 TABLET BY MOUTH TWICE  DAILY, Disp: 60 tablet, Rfl: 11   furosemide  (LASIX ) 20 MG tablet, Take 1 tablet (20  mg total) by mouth daily as needed., Disp: 30 tablet, Rfl: 2   latanoprost (XALATAN) 0.005 % ophthalmic solution, Place 1 drop into both eyes at bedtime., Disp: , Rfl:    levothyroxine  (SYNTHROID ) 125 MCG tablet, Take 1 tablet (125 mcg total) by mouth daily., Disp: 30 tablet, Rfl: 1   metoprolol  succinate (TOPROL -XL) 25 MG 24 hr tablet, Take 0.5 tablets (12.5 mg total) by mouth daily., Disp: 45 tablet, Rfl: 3   polyethylene glycol (MIRALAX  / GLYCOLAX ) 17 g packet, Take 17 g by mouth daily as needed for severe constipation., Disp: , Rfl:    predniSONE  (DELTASONE ) 1 MG tablet, Take 1 tablet (1 mg total) by mouth daily., Disp: 90 tablet, Rfl: 3   Physical Exam:   BP 130/73   Pulse 65   Ht 5' 4 (1.626 m)   Wt 115 lb (52.2 kg)   SpO2 95%   BMI 19.74 kg/m   Pertinent Findings  CN II-XII intact Bilateral external auditory  canals with cerumen impaction Weber 512: equal Rinne 512: AC > BC b/l  Anterior rhinoscopy: Septum right deviation; bilateral inferior turbinates with minimal hypertrophy No lesions of oral cavity/oropharynx;, absent tonsils No obviously palpable neck masses/lymphadenopathy/thyromegaly (large hard mass noted at the sternoclavicular joint right No respiratory distress or stridor    Seprately Identifiable Procedures:  Procedure: Bilateral ear microscopy and cerumen removal using microscope (CPT [phone]) - Mod 50 Pre-procedure diagnosis: bilateral cerumen impaction external auditory canals Post-procedure diagnosis: same Indication: bilateral cerumen impaction; given patient's otologic complaints and history as well as for improved and comprehensive examination of external ear and tympanic membrane, bilateral otologic examination using microscope was performed and impacted cerumen removed  Procedure: Patient was placed semi-recumbent. Both ear canals were examined using the microscope with findings above. Cerumen removed from bilateral external auditory canals using suction  and currette with improvement in EAC examination and patency. Left: EAC was patent. TM was intact . Middle ear was aerated. Drainage: none Right: EAC was patent. TM was intact . Middle ear was aerated . Drainage: none Patient tolerated the procedure well.   Impression & Plans:  Deanna Rivera is a 89 y.o. female with the following     Cerumen impaction-  Removed without difficulty, hearing improved. Continue outpatient follow up with audiology. I am happy to see here back in our office for review of results and our reassessment of cerumen.   Dysphagia-   This has been previously worked up with recommendations for PEG tube secondary to multifactorial issues causing her swallowing including cervical osteophytes as well as overlapping polyneuropathy. Previous recommendations were for CT neck for evaluation of potential neck mass, repeat EMG, and peg tube. The patient declined this interventions. I have recommended that the patient continue her evaluation as recommended and if any indication for ENT intervention we would be happy to discuss this with her.    - f/u PRN   Thank you for allowing me the opportunity to care for your patient. Please do not hesitate to contact me should you have any other questions.  Sincerely, Belma Boxer PA-C [facility] Phone: [phone] Fax: [phone]  01/21/2024, 1:03 PM

## 2024-01-28 ENCOUNTER — Ambulatory Visit (INDEPENDENT_AMBULATORY_CARE_PROVIDER_SITE_OTHER): Admitting: Family

## 2024-01-28 ENCOUNTER — Ambulatory Visit: Payer: Self-pay | Admitting: Family

## 2024-01-28 VITALS — BP 126/86 | HR 80 | Ht 64.0 in | Wt 115.0 lb

## 2024-01-28 DIAGNOSIS — E878 Other disorders of electrolyte and fluid balance, not elsewhere classified: Secondary | ICD-10-CM | POA: Diagnosis not present

## 2024-01-28 DIAGNOSIS — I5032 Chronic diastolic (congestive) heart failure: Secondary | ICD-10-CM

## 2024-01-28 DIAGNOSIS — N3090 Cystitis, unspecified without hematuria: Secondary | ICD-10-CM

## 2024-01-28 LAB — POC URINALSYSI DIPSTICK (AUTOMATED)
Bilirubin, UA: NEGATIVE
Blood, UA: POSITIVE
Glucose, UA: NEGATIVE
Ketones, UA: NEGATIVE
Nitrite, UA: NEGATIVE
Protein, UA: NEGATIVE
Spec Grav, UA: 1.015 (ref 1.010–1.025)
Urobilinogen, UA: 0.2 U/dL
pH, UA: 6 (ref 5.0–8.0)

## 2024-01-28 LAB — BASIC METABOLIC PANEL WITH GFR
BUN: 16 mg/dL (ref 6–23)
CO2: 34 meq/L — ABNORMAL HIGH (ref 19–32)
Calcium: 10.9 mg/dL — ABNORMAL HIGH (ref 8.4–10.5)
Chloride: 98 meq/L (ref 96–112)
Creatinine, Ser: 0.28 mg/dL — ABNORMAL LOW (ref 0.40–1.20)
GFR: 95.92 mL/min (ref >60.00)
Glucose, Bld: 91 mg/dL (ref 70–99)
Potassium: 4.2 meq/L (ref 3.5–5.1)
Sodium: 137 meq/L (ref 135–145)

## 2024-01-28 MED ORDER — CEPHALEXIN 250 MG/5ML PO SUSR: 500.0000 mg | Freq: Two times a day (BID) | ORAL | 0 refills | Status: AC

## 2024-01-28 NOTE — Progress Notes (Signed)
 Patient ID: Deanna Rivera, female    DOB: 10-Mar-1933, 89 y.o.   MRN: [id number]  Chief Complaint  Patient presents with   Urinary Tract Infection    Asking for electrolytes to be checked and to test for a UTI.   Discussed the use of AI scribe software for clinical note transcription with the patient, who gave verbal consent to proceed.  History of Present Illness Stpehanie Rivera is an 89 year old female with recurrent urinary tract infections who presents with persistent symptoms despite prior antibiotic treatments.  She experiences frequent urination, dysuria, and malodorous urine for several months. Multiple antibiotics, including cefuroxime  and Augmentin , have been ineffective. Augmentin  caused diarrhea, leading to an incomplete course. Difficulty swallowing pills complicates her treatment. She is concerned about polyuria, possibly related to Lasix . Her current medications include metoprolol , digoxin , Lasix , and prednisone . She is aware of her serum creatinine levels but has no specific kidney disease diagnosis. She has a pacemaker and feels unwell with a heart rate of 80, higher than the pacemaker setting of 60. She is on a blood thinner that does not require regular monitoring. She lives in an independent living facility and faces challenges with personal hygiene due to limited dexterity, relying on her husband for assistance with bathing.  Assessment & Plan Recurrent Urinary Tract Infection (UTI) Chronic UTI with previous ineffective treatments. Can't swallow pills, Augmentin  susp sent last UTI caused diarrhea, allergy to sulfa. Ceftin  not effective. Emphasized completing antibiotics to prevent recurrence. - Prescribe Cephalexin  suspension 500 mg/10 mL twice daily for 7 days. - Order urine culture for antibiotic coverage, will call when culture result back - Send prescription to [facility] pharmacy. - Increase water intake to 2L daily - Call office back if sx are not improved after  finishing antibiotic  Chronic Heart Failure Managed with metoprolol , digoxin , and Lasix . Heart rate 80 bpm despite pacemaker. No current cardiologist due to retirement. Discussed need for new cardiologist. Hx of hyponatremia, would like rechecked today. - Check BMP today - Send message to Doctor Jodie for cardiologist recommendation. - Advise contacting cardiovascular center at [facility] for new cardiologist.  Pacemaker Management Heart rate 80 bpm, supposed to be paced at 60bpm. Dissatisfaction with retiring electrophysiologist. Discussed need for new cardiologist for pacemaker management. - Advise establishing care with new cardiologist for pacemaker management.  Corn on Toe Tenderness and scaly skin post corn removal on right 4th lateral toe, new callus on right lateral great toe. Possible incomplete healing. Advised podiatrist follow-up. - Advise follow-up with podiatrist for toe condition evaluation and management.  General Health Maintenance On low dose prednisone , chronic Lasix  and metoprolol . Requested lab work to recheck electrolytes. - Order BMP to assess current health status.   Subjective:    Outpatient Medications Prior to Visit  Medication Sig Dispense Refill   digoxin  (LANOXIN ) 0.125 MG tablet TAKE 1 TABLET BY MOUTH DAILY 90 tablet 2   dorzolamide-timolol (COSOPT) 22.3-6.8 MG/ML ophthalmic solution Place 1 drop into both eyes 2 (two) times daily.     ELIQUIS  2.5 MG TABS tablet TAKE 1 TABLET BY MOUTH TWICE  DAILY 60 tablet 11   furosemide  (LASIX ) 20 MG tablet Take 1 tablet (20 mg total) by mouth daily as needed. 30 tablet 2   latanoprost (XALATAN) 0.005 % ophthalmic solution Place 1 drop into both eyes at bedtime.     levothyroxine  (SYNTHROID ) 125 MCG tablet Take 1 tablet (125 mcg total) by mouth daily. 30 tablet 1   metoprolol  succinate (TOPROL -XL) 25  MG 24 hr tablet Take 0.5 tablets (12.5 mg total) by mouth daily. 45 tablet 3   predniSONE  (DELTASONE ) 1 MG tablet  Take 1 tablet (1 mg total) by mouth daily. 90 tablet 3   acetaminophen  (TYLENOL ) 500 MG tablet Take 500 mg by mouth every 6 (six) hours as needed for mild pain or headache. (Patient not taking: Reported on 01/28/2024)     polyethylene glycol (MIRALAX  / GLYCOLAX ) 17 g packet Take 17 g by mouth daily as needed for severe constipation. (Patient not taking: Reported on 01/28/2024)     No facility-administered medications prior to visit.   Past Medical History:  Diagnosis Date   Cataract    Hypertension    Moderate aortic stenosis    Moderate mitral regurgitation    Persistent atrial fibrillation (HCC)    PMR (polymyalgia rheumatica) (HCC)    Second degree AV block    Sick sinus syndrome (HCC)    Thyroid  disease    Past Surgical History:  Procedure Laterality Date   ABDOMINAL HYSTERECTOMY     APPENDECTOMY     CESAREAN SECTION     x 3   CHOLECYSTECTOMY     PACEMAKER GENERATOR CHANGE  11/20/2018   [employer] Accolade MRI PPM implanted in [place]    PACEMAKER IMPLANT     PILONIDAL CYST EXCISION     Allergies  Allergen Reactions   Shellfish-Derived Products Shortness Of Breath and Other (See Comments)    Lips go numb   Epinephrine  Other (See Comments) and Hypertension    Makes me feel wired   Monosodium Glutamate Other (See Comments)    other   Monosodium Glutamate Hypertension and Other (See Comments)    Makes me feel badly  Other Reaction(s): Other (See Comments)  other, other   Ciprofloxacin Other (See Comments)    Allergic reaction not recalled  Other Reaction(s): Other (See Comments)  Other reaction(s): Intolerance   Dronedarone     Other Reaction(s): Other (See Comments)  Leg swelling, Leg swelling   Erythromycin Other (See Comments)    Allergic reaction not recalled   Sulfa Antibiotics Other (See Comments)    Allergic reaction not recalled   Sulfamethoxazole-Trimethoprim    Tetracyclines & Related Other (See Comments)    Allergic reaction not  recalled      Objective:    Physical Exam Vitals and nursing note reviewed.  Constitutional:      Appearance: Normal appearance.   Cardiovascular:     Rate and Rhythm: Normal rate and regular rhythm.  Pulmonary:     Effort: Pulmonary effort is normal.     Breath sounds: Normal breath sounds.   Musculoskeletal:        General: Normal range of motion.     Right lower leg: 3+ Edema (ankle & foot) present.     Left lower leg: 3+ Edema (ankle and foot) present.  Feet:     Right foot:     Skin integrity: Callus (healing on 4th lateral toe, dried skin noted; new callus noted on lateral great toe) present.   Skin:    General: Skin is warm and dry.   Neurological:     Mental Status: She is alert.   Psychiatric:        Mood and Affect: Mood normal.        Behavior: Behavior normal.   BP 126/86   Pulse 80   Ht 5' 4 (1.626 m)   Wt 115 lb (52.2 kg)   SpO2 99%  BMI 19.74 kg/m  Wt Readings from Last 3 Encounters:  01/28/24 115 lb (52.2 kg)  01/21/24 115 lb (52.2 kg)  12/24/23 114 lb 8 oz (51.9 kg)      Lucius Krabbe, NP

## 2024-01-30 LAB — URINE CULTURE
MICRO NUMBER:: 16590448
SPECIMEN QUALITY:: ADEQUATE

## 2024-01-30 NOTE — Progress Notes (Signed)
 Call patient - electrolytes are in normal range. Serum creatinine is remaining at her baseline. Her urine culture indicates the Cephalexin  antibiotic I gave her should treat her infection, continue taking until finished. Thx

## 2024-02-05 ENCOUNTER — Ambulatory Visit (INDEPENDENT_AMBULATORY_CARE_PROVIDER_SITE_OTHER): Payer: Medicare Other

## 2024-02-05 DIAGNOSIS — I442 Atrioventricular block, complete: Secondary | ICD-10-CM | POA: Diagnosis not present

## 2024-02-05 LAB — CUP PACEART REMOTE DEVICE CHECK
Battery Remaining Longevity: 132 mo
Battery Remaining Percentage: 100 %
Brady Statistic RA Percent Paced: 0 %
Brady Statistic RV Percent Paced: 63 %
Date Time Interrogation Session: 20250625064100
Implantable Lead Connection Status: 753985
Implantable Lead Connection Status: 753985
Implantable Lead Implant Date: 20130305
Implantable Lead Implant Date: 20130305
Implantable Lead Location: 753859
Implantable Lead Location: 753860
Implantable Lead Model: 4469
Implantable Lead Model: 4470
Implantable Lead Serial Number: 546229
Implantable Lead Serial Number: 712016
Implantable Pulse Generator Implant Date: 20200409
Lead Channel Impedance Value: 399 Ohm
Lead Channel Pacing Threshold Amplitude: 0.6 V
Lead Channel Pacing Threshold Pulse Width: 0.4 ms
Lead Channel Setting Pacing Amplitude: 1.1 V
Lead Channel Setting Pacing Pulse Width: 0.4 ms
Lead Channel Setting Sensing Sensitivity: 0.6 mV
Pulse Gen Serial Number: 859007
Zone Setting Status: 755011

## 2024-02-09 ENCOUNTER — Ambulatory Visit: Payer: Self-pay | Admitting: Internal Medicine

## 2024-02-10 ENCOUNTER — Ambulatory Visit: Payer: Self-pay

## 2024-02-10 ENCOUNTER — Ambulatory Visit: Payer: Self-pay | Admitting: *Deleted

## 2024-02-10 NOTE — Telephone Encounter (Signed)
 Message from [place] M sent at 02/10/2024  9:21 AM EDT  Patient has finished all of her antibiotics for a UTI, she said she is still urinating frequently and wanting to know what else she should do    Call History  Contact Date/Time Type Contact Phone/Fax By  02/10/2024 09:18 AM EDT Phone (Incoming) Bensen, Sharley (Self) [phone] Beverley Laymon SAILOR   Reason for Disposition  [1] POSITIVE urine test AND [2] antibiotic treatment in past month for urine infection  Answer Assessment - Initial Assessment Questions 1. TEST: What kind of urine test was performed? (e.g., urinalysis, urine dipstick)      Urine test was done.  So I was prescribed an antibiotic.    I completed the antibiotics I'm urinating frequently, burning once in a while.   Nothing is changing with these UTIs.  I've been on many antibiotics.   I don't know what else to do.    2. URINALYSIS RESULT: Was it positive or negative?  If positive, document what was positive (e.g., LE, WBC, RBC, bacteria, epithelial cells)     The last culture was worse. I get these so often.   3. FEVER: Do you have a fever? If Yes, ask: What is your temperature, how was it measured, and when did it start?     No 4. FLANK PAIN: Do you have any pain in your side?     No flank pain 5. OTHER SYMPTOMS: Do you have any other symptoms? (e.g., blood in urine, vomiting)     Just these repeated urine infections. 6. PREGNANCY: Is there any chance you are pregnant? When was your last menstrual period?     N/A due to age 89. PHENAZOPYRIDINE (Uristat, Pyridium): Have you taken phenazopyridine (turns your urine orange) recently?     Not asked  Protocols used: Urinalysis Results Follow-up Call-A-AH   FYI Only or Action Required?: FYI only for provider.  Patient was last seen in primary care on 01/28/2024 by Lucius Krabbe, NP. Called Nurse Triage reporting Urinary Frequency. Symptoms began several weeks ago. Interventions attempted:  Prescription medications: antibiotic. Symptoms are: unchanged Frequency, smell.  Triage Disposition: Call PCP Within 24 Hours  Patient/caregiver understands and will follow disposition?: Yes

## 2024-02-10 NOTE — Telephone Encounter (Signed)
 Appt tomorrow.

## 2024-02-10 NOTE — Telephone Encounter (Signed)
 Attempt X1 to call patient back regarding the s/s noted.   No response.  Left discrete VM appropriately

## 2024-02-10 NOTE — Telephone Encounter (Signed)
 Copied from CRM [id number]. Topic: Clinical - Pink Word Triage >> Feb 10, 2024  9:20 AM [place] M wrote: Reason for Triage: Patient has finished all of her antibiotics for a UTI, she said she is still urinating frequently and wanting to know what else she should do >> Feb 10, 2024  9:21 AM [place] M wrote: Patient has finished all of her antibiotics for a UTI, she said she is still urinating frequently and wanting to know what else she should do

## 2024-02-11 ENCOUNTER — Other Ambulatory Visit (HOSPITAL_BASED_OUTPATIENT_CLINIC_OR_DEPARTMENT_OTHER): Payer: Self-pay

## 2024-02-11 ENCOUNTER — Ambulatory Visit (INDEPENDENT_AMBULATORY_CARE_PROVIDER_SITE_OTHER): Admitting: Family

## 2024-02-11 ENCOUNTER — Encounter: Payer: Self-pay | Admitting: Family

## 2024-02-11 VITALS — BP 173/75 | HR 58 | Temp 97.6°F | Wt 115.0 lb

## 2024-02-11 DIAGNOSIS — N3090 Cystitis, unspecified without hematuria: Secondary | ICD-10-CM

## 2024-02-11 LAB — POCT URINALYSIS DIPSTICK
Bilirubin, UA: NEGATIVE
Blood, UA: NEGATIVE
Glucose, UA: NEGATIVE
Ketones, UA: NEGATIVE
Protein, UA: NEGATIVE
Spec Grav, UA: 1.01 (ref 1.010–1.025)
Urobilinogen, UA: 0.2 U/dL
pH, UA: 7.5 (ref 5.0–8.0)

## 2024-02-11 MED ORDER — CEPHALEXIN 250 MG/5ML PO SUSR: 500.0000 mg | Freq: Two times a day (BID) | ORAL | 0 refills | Status: DC

## 2024-02-11 MED ORDER — CEPHALEXIN 250 MG/5ML PO SUSR
500.0000 mg | Freq: Two times a day (BID) | ORAL | 0 refills | Status: AC
  Filled 2024-02-11 – 2024-02-12 (×2): qty 200, 10d supply, fill #0

## 2024-02-11 MED ORDER — CEFTRIAXONE SODIUM 1 G IJ SOLR
1.0000 g | Freq: Once | INTRAMUSCULAR | Status: AC
  Administered 2024-02-11: 1 g via INTRAMUSCULAR

## 2024-02-11 MED ORDER — CEFTRIAXONE SODIUM 1 G IJ SOLR: 1.0000 g | Freq: Once | INTRAMUSCULAR | Status: DC

## 2024-02-11 NOTE — Progress Notes (Signed)
 Patient ID: Deanna Rivera, female    DOB: 1932/09/13, 89 y.o.   MRN: [id number]  Chief Complaint  Patient presents with   Urinary Frequency    4 months  Discussed the use of AI scribe software for clinical note transcription with the patient, who gave verbal consent to proceed.  History of Present Illness Renn Folmar is an 89 year old female with recurrent urinary tract infections who presents with symptoms of a recurrent urinary tract infection. She is accompanied by an unnamed individual who appears to be a caregiver or family member.  Urinary tract symptoms - Recurrent urinary tract infections with persistent urinary frequency despite recent treatment finished a week ago - Completed a course of liquid Keflex  - Previous urinalyses have shown varying levels of bacteria: large amount a few weeks ago, trace amounts in the past - No diarrhea with recent antibiotic treatment  Constitutional symptoms - Weakness and nausea present - Fluctuates between feeling very hot and very cold, with cold sensation being particularly distressing - Last night experienced chills, felt very cold, and had sneezing - Caregiver observed she was 'ice cold' and provided massage for comfort  Medication use and tolerance - No recent use of over-the-counter medications such as Tylenol  or ibuprofen - Feels 'horrible' on antibiotics  Assessment & Plan Recurrent Urinary Tract Infection (UTI) Recurrent UTIs with moderate leukocytes on urinalysis. Allergy to several antibiotics and can only swallow liquids, no pills. Previous treatments included Keflex , including 2 weeks ago. Discussed prophylactic antibiotics and potential urology referral. Pt very tearful and despondent after hearing about reinfection and not wanting to have to take antibiotics again. Reviewed possible causes for UTI, she denies any recent diarrhea, but admits she can not wipe herself and requires help from her husband and probably not done as  hygienically as it should be. She also does not drink enough water daily. - Administer Rocephin  1g (ceftriaxone ) injection today. - Prescribe Keflex  500mg  bid suspension for continued treatment starting tomorrow. - Send a note to Dr. Jodie, PCP,  requesting review of notes and possibility of seeing pt prior to August f/u appt. - Consider referral to a urologist for further evaluation.  Hypertension Elevated blood pressure today, possibly related to infection and overall health. Taking Metoprolol  12.5mg  qd and digoxin  qd.  - Advised on increasing water intake, watch sodium intake.   Subjective:    Outpatient Medications Prior to Visit  Medication Sig Dispense Refill   digoxin  (LANOXIN ) 0.125 MG tablet TAKE 1 TABLET BY MOUTH DAILY 90 tablet 2   dorzolamide-timolol (COSOPT) 22.3-6.8 MG/ML ophthalmic solution Place 1 drop into both eyes 2 (two) times daily.     ELIQUIS  2.5 MG TABS tablet TAKE 1 TABLET BY MOUTH TWICE  DAILY 60 tablet 11   furosemide  (LASIX ) 20 MG tablet Take 1 tablet (20 mg total) by mouth daily as needed. 30 tablet 2   latanoprost (XALATAN) 0.005 % ophthalmic solution Place 1 drop into both eyes at bedtime.     levothyroxine  (SYNTHROID ) 125 MCG tablet Take 1 tablet (125 mcg total) by mouth daily. 30 tablet 1   metoprolol  succinate (TOPROL -XL) 25 MG 24 hr tablet Take 0.5 tablets (12.5 mg total) by mouth daily. 45 tablet 3   predniSONE  (DELTASONE ) 1 MG tablet Take 1 tablet (1 mg total) by mouth daily. 90 tablet 3   acetaminophen  (TYLENOL ) 500 MG tablet Take 500 mg by mouth every 6 (six) hours as needed for mild pain or headache. (Patient not taking: Reported on 02/11/2024)  polyethylene glycol (MIRALAX  / GLYCOLAX ) 17 g packet Take 17 g by mouth daily as needed for severe constipation. (Patient not taking: Reported on 02/11/2024)     No facility-administered medications prior to visit.   Past Medical History:  Diagnosis Date   Cataract    Hypertension    Moderate aortic  stenosis    Moderate mitral regurgitation    Persistent atrial fibrillation (HCC)    PMR (polymyalgia rheumatica) (HCC)    Second degree AV block    Sick sinus syndrome (HCC)    Thyroid  disease    Past Surgical History:  Procedure Laterality Date   ABDOMINAL HYSTERECTOMY     APPENDECTOMY     CESAREAN SECTION     x 3   CHOLECYSTECTOMY     PACEMAKER GENERATOR CHANGE  11/20/2018   [employer] Accolade MRI PPM implanted in [place]    PACEMAKER IMPLANT     PILONIDAL CYST EXCISION     Allergies  Allergen Reactions   Shellfish-Derived Products Shortness Of Breath and Other (See Comments)    Lips go numb   Epinephrine  Other (See Comments) and Hypertension    Makes me feel wired   Monosodium Glutamate Other (See Comments)    other   Monosodium Glutamate Hypertension and Other (See Comments)    Makes me feel badly  Other Reaction(s): Other (See Comments)  other, other   Ciprofloxacin Other (See Comments)    Allergic reaction not recalled  Other Reaction(s): Other (See Comments)  Other reaction(s): Intolerance   Dronedarone     Other Reaction(s): Other (See Comments)  Leg swelling, Leg swelling   Erythromycin Other (See Comments)    Allergic reaction not recalled   Sulfa Antibiotics Other (See Comments)    Allergic reaction not recalled   Sulfamethoxazole-Trimethoprim    Tetracyclines & Related Other (See Comments)    Allergic reaction not recalled      Objective:    Physical Exam Vitals and nursing note reviewed.  Constitutional:      Appearance: Normal appearance.   Cardiovascular:     Rate and Rhythm: Normal rate and regular rhythm.  Pulmonary:     Effort: Pulmonary effort is normal.     Breath sounds: Normal breath sounds.   Musculoskeletal:        General: Normal range of motion.     Right lower leg: 3+ Edema (ankle & foot) present.     Left lower leg: 3+ Edema (ankle and foot) present.   Skin:    General: Skin is warm and dry.    Neurological:     Mental Status: She is alert.     Motor: Weakness present.     Gait: Gait abnormal (requires walker for ambulation).   Psychiatric:        Mood and Affect: Mood normal.        Behavior: Behavior normal.    BP (!) 173/75   Pulse (!) 58   Temp 97.6 F (36.4 C)   Wt 115 lb (52.2 kg)   SpO2 96%   BMI 19.74 kg/m  Wt Readings from Last 3 Encounters:  02/11/24 115 lb (52.2 kg)  01/28/24 115 lb (52.2 kg)  01/21/24 115 lb (52.2 kg)      Lucius Krabbe, NP

## 2024-02-11 NOTE — Patient Instructions (Signed)
 It was very nice to see you today!   You have tested positive for another UTI and we have given you a Rocephin  injection in the office today as well as sent over the liquid Keflex  to take again starting tomorrow. I will let Dr Jodie know about our visit today to get her recommendations regarding your recurrent infections. Try to drink up to 1.5 liters of water daily = 6 cups of 8oz every day. Wipe from front to back after using the bathroom to keep stool bacteria out of your urinary tract.  Let us  know if you are not feeling better.      PLEASE NOTE:  If you had any lab tests please let us  know if you have not heard back within a few days. You may see your results on MyChart before we have a chance to review them but we will give you a call once they are reviewed by us . If we ordered any referrals today, please let us  know if you have not heard from their office within the next week.

## 2024-02-12 ENCOUNTER — Other Ambulatory Visit (HOSPITAL_BASED_OUTPATIENT_CLINIC_OR_DEPARTMENT_OTHER): Payer: Self-pay

## 2024-02-17 NOTE — Telephone Encounter (Signed)
 Copied from CRM [id number]. Topic: Referral - Request for Referral >> Feb 17, 2024 11:27 AM Wess RAMAN wrote: Did the patient discuss referral with their provider in the last year? Yes (If No - schedule appointment) (If Yes - send message)  Appointment offered? No  Type of order/referral and detailed reason for visit: Urololgist  Preference of office, provider, location: [facility] [address] [place], [place], [place] [address] Phone: [phone] Fax: [phone]  If referral order, have you been seen by this specialty before? Yes (If Yes, this issue or another issue? When? Where? Another issue Can we respond through MyChart? Yes

## 2024-02-18 NOTE — Progress Notes (Signed)
 This encounter was created in error - please disregard.

## 2024-02-20 ENCOUNTER — Ambulatory Visit

## 2024-02-20 ENCOUNTER — Telehealth: Payer: Self-pay

## 2024-02-20 VITALS — Ht 63.0 in | Wt 115.0 lb

## 2024-02-20 DIAGNOSIS — Z0000 Encounter for general adult medical examination without abnormal findings: Secondary | ICD-10-CM

## 2024-02-20 NOTE — Progress Notes (Addendum)
 Subjective:   Deanna Rivera is a 89 y.o. who presents for a Medicare Wellness preventive visit.  As a reminder, Annual Wellness Visits don't include a physical exam, and some assessments may be limited, especially if this visit is performed virtually. We may recommend an in-person follow-up visit with your provider if needed.  Visit Complete: Virtual I connected with  Deanna Rivera on 02/20/24 by a audio enabled telemedicine application and verified that I am speaking with the correct person using two identifiers.  Patient Location: Home  Provider Location: Office/Clinic  I discussed the limitations of evaluation and management by telemedicine. The patient expressed understanding and agreed to proceed.  Vital Signs: Because this visit was a virtual/telehealth visit, some criteria may be missing or patient reported. Any vitals not documented were not able to be obtained and vitals that have been documented are patient reported.  VideoDeclined- This patient declined Librarian, academic. Therefore the visit was completed with audio only.  Persons Participating in Visit: Patient.  AWV Questionnaire: No: Patient Medicare AWV questionnaire was not completed prior to this visit.  Cardiac Risk Factors include: advanced age (>40men, >61 women);dyslipidemia;hypertension     Objective:    Today's Vitals   02/20/24 1540  Weight: 115 lb (52.2 kg)  Height: 5' 3 (1.6 m)   Body mass index is 20.37 kg/m.     02/20/2024    3:47 PM 11/13/2023    2:53 PM 11/13/2023    2:14 PM 08/13/2023   10:04 AM 05/22/2023    2:34 PM 02/07/2023    3:40 PM 11/04/2022    6:00 AM  Advanced Directives  Does Patient Have a Medical Advance Directive? Yes Yes Yes No Yes No No  Type of Estate agent of [place];Living will Living will;Healthcare Power of [employer] Power of [place];Living will  Living will;Healthcare Power of Attorney    Copy of Healthcare  Power of Attorney in Chart? No - copy requested        Would patient like information on creating a medical advance directive?    No - Patient declined  No - Patient declined No - Patient declined    Current Medications (verified) Outpatient Encounter Medications as of 02/20/2024  Medication Sig   acetaminophen  (TYLENOL ) 500 MG tablet Take 500 mg by mouth every 6 (six) hours as needed for mild pain or headache.   cephALEXin  (KEFLEX ) 250 MG/5ML suspension Take 10 mLs (500 mg total) by mouth 2 (two) times daily for 7 days. Discard remainder.   digoxin  (LANOXIN ) 0.125 MG tablet TAKE 1 TABLET BY MOUTH DAILY   dorzolamide-timolol (COSOPT) 22.3-6.8 MG/ML ophthalmic solution Place 1 drop into both eyes 2 (two) times daily.   ELIQUIS  2.5 MG TABS tablet TAKE 1 TABLET BY MOUTH TWICE  DAILY   furosemide  (LASIX ) 20 MG tablet Take 1 tablet (20 mg total) by mouth daily as needed.   latanoprost (XALATAN) 0.005 % ophthalmic solution Place 1 drop into both eyes at bedtime.   levothyroxine  (SYNTHROID ) 125 MCG tablet Take 1 tablet (125 mcg total) by mouth daily.   metoprolol  succinate (TOPROL -XL) 25 MG 24 hr tablet Take 0.5 tablets (12.5 mg total) by mouth daily.   polyethylene glycol (MIRALAX  / GLYCOLAX ) 17 g packet Take 17 g by mouth daily as needed for severe constipation.   predniSONE  (DELTASONE ) 1 MG tablet Take 1 tablet (1 mg total) by mouth daily.   No facility-administered encounter medications on file as of 02/20/2024.    Allergies (  verified) Shellfish-derived products, Epinephrine , Monosodium glutamate, Monosodium glutamate, Ciprofloxacin, Dronedarone, Erythromycin, Sulfa antibiotics, Sulfamethoxazole-trimethoprim, and Tetracyclines & related   History: Past Medical History:  Diagnosis Date   Cataract    Hypertension    Moderate aortic stenosis    Moderate mitral regurgitation    Persistent atrial fibrillation (HCC)    PMR (polymyalgia rheumatica) (HCC)    Second degree AV block    Sick sinus  syndrome (HCC)    Thyroid  disease    Past Surgical History:  Procedure Laterality Date   ABDOMINAL HYSTERECTOMY     APPENDECTOMY     CESAREAN SECTION     x 3   CHOLECYSTECTOMY     PACEMAKER GENERATOR CHANGE  11/20/2018   [employer] Accolade MRI PPM implanted in [place]    PACEMAKER IMPLANT     PILONIDAL CYST EXCISION     Family History  Problem Relation Age of Onset   Early death Mother 28   Rheumatic fever Mother    Heart disease Mother        valve replacement   Cirrhosis Father 26   Hypertension Father    Hypertension Brother    Lung cancer Brother    Hypertension Brother    Other Brother        smoker   Scleroderma Daughter    Hypertension Son    Hypertension Son    Social History   Socioeconomic History   Marital status: Married    Spouse name: Not on file   Number of children: Not on file   Years of education: Not on file   Highest education level: Not on file  Occupational History   Occupation: Retired     Comment: Midwife   Tobacco Use   Smoking status: Former    Types: Cigarettes    Passive exposure: Past   Smokeless tobacco: Never  Vaping Use   Vaping status: Never Used  Substance and Sexual Activity   Alcohol use: Not Currently   Drug use: Not Currently   Sexual activity: Not Currently    Birth control/protection: Post-menopausal  Other Topics Concern   Not on file  Social History Narrative   ** Merged History Encounter **       Recently moved from [place] . In independent living with her husband. Daughter died from scleroderma complications. Hx of falls, but last was > 1.5 years ago. 01/18/2019    Social Drivers of Health   Financial Resource Strain: Low Risk  (02/20/2024)   Overall Financial Resource Strain (CARDIA)    Difficulty of Paying Living Expenses: Not hard at all  Food Insecurity: No Food Insecurity (02/20/2024)   Hunger Vital Sign    Worried About Running Out of Food in the Last Year: Never true    Ran  Out of Food in the Last Year: Never true  Transportation Needs: No Transportation Needs (02/20/2024)   PRAPARE - Administrator, Civil Service (Medical): No    Lack of Transportation (Non-Medical): No  Physical Activity: Sufficiently Active (02/20/2024)   Exercise Vital Sign    Days of Exercise per Week: 5 days    Minutes of Exercise per Session: 30 min  Stress: No Stress Concern Present (02/20/2024)   [employer] of Occupational Health - Occupational Stress Questionnaire    Feeling of Stress: Only a little  Social Connections: Moderately Integrated (02/20/2024)   Social Connection and Isolation Panel    Frequency of Communication with Friends and Family: More than three times a week  Frequency of Social Gatherings with Friends and Family: More than three times a week    Attends Religious Services: More than 4 times per year    Active Member of [employer] or Organizations: No    Attends Engineer, structural: Never    Marital Status: Married    Tobacco Counseling Counseling given: Not Answered    Clinical Intake:  Pre-visit preparation completed: Yes  Pain : No/denies pain     BMI - recorded: 20.37 Nutritional Status: BMI of 19-24  Normal Diabetes: No  Lab Results  Component Value Date   HGBA1C 5.8 (H) 07/28/2020   HGBA1C 5.8 10/27/2019     How often do you need to have someone help you when you read instructions, pamphlets, or other written materials from your doctor or pharmacy?: 1 - Never  Interpreter Needed?: No  Information entered by :: Ellouise Haws, LPN   Activities of Daily Living     02/20/2024    3:41 PM  In your present state of health, do you have any difficulty performing the following activities:  Hearing? 1  Comment hearing aids  Vision? 0  Difficulty concentrating or making decisions? 0  Walking or climbing stairs? 0  Dressing or bathing? 0  Doing errands, shopping? 0  Preparing Food and eating ? Y  Comment meals  prepared  Using the Toilet? N  In the past six months, have you accidently leaked urine? Y  Comment weras a pad  Do you have problems with loss of bowel control? N  Managing your Medications? N  Managing your Finances? N  Housekeeping or managing your Housekeeping? N    Patient Care Team: Jodie Lavern CROME, MD as PCP - General (Family Medicine) Waddell Danelle ORN, MD as PCP - Electrophysiology (Cardiology) Kelsie Agent, MD (Inactive) as Consulting Physician (Cardiology) Gretta Bertrum ORN DEVONNA as Physician Assistant (Orthopedic Surgery) Ziolkowska, Aldona, MD as Consulting Physician (Rheumatology) Jodie Lavern CROME, MD as Attending Physician (Family Medicine) Nicholaus Sherlean CROME, [facility] (Inactive) as Pharmacist (Pharmacist) Leigh Venetia CROME, MD as Consulting Physician (Neurology) Gardenia Led, DO as Consulting Physician (Cardiology)  I have updated your Care Teams any recent Medical Services you may have received from other providers in the past year.     Assessment:   This is a routine wellness examination for [facility].  Hearing/Vision screen Hearing Screening - Comments:: Pt has hearing issues  Vision Screening - Comments:: Wears rx glasses - up to date with routine eye exams with Dr octavia     Goals Addressed   None    Depression Screen     02/20/2024    3:44 PM 12/24/2023   11:36 AM 12/04/2023    2:05 PM 11/06/2023    2:03 PM 11/04/2023   10:54 AM 06/26/2023    1:23 PM 06/05/2023    9:53 AM  PHQ 2/9 Scores  PHQ - 2 Score 0 0 0 0 0 0 0  PHQ- 9 Score      0 0    Fall Risk     02/20/2024    3:48 PM 01/28/2024   11:51 AM 12/24/2023   11:36 AM 12/04/2023    2:05 PM 11/13/2023    2:53 PM  Fall Risk   Falls in the past year? 0 0 0 0 0  Number falls in past yr: 0 0 0 0 0  Injury with Fall? 0 0 0 0 0  Risk for fall due to : Impaired mobility No Fall Risks No Fall Risks  No Fall Risks   Follow up Falls prevention discussed Falls evaluation completed Falls evaluation completed Falls  evaluation completed Falls evaluation completed    MEDICARE RISK AT HOME:  Medicare Risk at Home Any stairs in or around the home?: No If so, are there any without handrails?: No Home free of loose throw rugs in walkways, pet beds, electrical cords, etc?: Yes Adequate lighting in your home to reduce risk of falls?: Yes Life alert?: Yes Use of a cane, walker or w/c?: Yes Grab bars in the bathroom?: Yes Shower chair or bench in shower?: Yes Elevated toilet seat or a handicapped toilet?: Yes  TIMED UP AND GO:  Was the test performed?  No  Cognitive Function: 6CIT completed        02/20/2024    3:50 PM 02/07/2023    3:44 PM 11/14/2021   10:18 AM 11/12/2019   11:38 AM  6CIT Screen  What Year? 0 points 0 points 0 points 0 points  What month? 0 points 0 points 0 points 0 points  What time? 0 points 0 points 0 points 0 points  Count back from 20 0 points 0 points 0 points 0 points  Months in reverse 0 points 0 points 0 points 0 points  Repeat phrase 0 points 2 points 0 points 0 points  Total Score 0 points 2 points 0 points 0 points    Immunizations Immunization History  Administered Date(s) Administered   Fluad Quad(high Dose 65+) 05/13/2020   Fluad Trivalent(High Dose 65+) 05/14/2023   Influenza, High Dose Seasonal PF 05/22/2021   Influenza-Unspecified 05/21/2022   Moderna Covid-19 Vaccine Bivalent Booster 4yrs & up 05/22/2021   Moderna Sars-Covid-2 Vaccination 08/12/2019, 09/09/2019, 06/14/2020, 12/29/2020   Pneumococcal Conjugate-13 04/28/2019   Tdap 05/12/2021, 09/04/2021    Screening Tests Health Maintenance  Topic Date Due   COVID-19 Vaccine (6 - 2024-25 season) 04/14/2023   Pneumococcal Vaccine: 50+ Years (2 of 2 - PPSV23, PCV20, or PCV21) 06/25/2024 (Originally 06/23/2019)   INFLUENZA VACCINE  03/13/2024   Medicare Annual Wellness (AWV)  02/19/2025   DTaP/Tdap/Td (3 - Td or Tdap) 09/05/2031   Hepatitis B Vaccines  Aged Out   HPV VACCINES  Aged Out    Meningococcal B Vaccine  Aged Out   DEXA SCAN  Discontinued   Zoster Vaccines- Shingrix  Discontinued    Health Maintenance  Health Maintenance Due  Topic Date Due   COVID-19 Vaccine (6 - 2024-25 season) 04/14/2023   Health Maintenance Items Addressed: See Nurse Notes at the end of this note  Additional Screening:  Vision Screening: Recommended annual ophthalmology exams for early detection of glaucoma and other disorders of the eye. Would you like a referral to an eye doctor? No    Dental Screening: Recommended annual dental exams for proper oral hygiene  Community Resource Referral / Chronic Care Management: CRR required this visit?  No   CCM required this visit?  No   Plan:    I have personally reviewed and noted the following in the patient's chart:   Medical and social history Use of alcohol, tobacco or illicit drugs  Current medications and supplements including opioid prescriptions. Patient is not currently taking opioid prescriptions. Functional ability and status Nutritional status Physical activity Advanced directives List of other physicians Hospitalizations, surgeries, and ER visits in previous 12 months Vitals Screenings to include cognitive, depression, and falls Referrals and appointments  In addition, I have reviewed and discussed with patient certain preventive protocols, quality metrics, and best  practice recommendations. A written personalized care plan for preventive services as well as general preventive health recommendations were provided to patient.   Ellouise VEAR Haws, LPN   2/89/7974   After Visit Summary: (MyChart) Due to this being a telephonic visit, the after visit summary with patients personalized plan was offered to patient via MyChart   Notes: Nothing significant to report at this time.

## 2024-02-20 NOTE — Telephone Encounter (Signed)
 is she asking about the culture results? Unfortunately, this was not ordered and I am not sure why, I am guessing it was thrown out and could not be tested, because I always order a culture with a positive UTI. Has she finished the antibiotic? Is she having symptoms?

## 2024-02-20 NOTE — Patient Instructions (Signed)
 Deanna Rivera , Thank you for taking time out of your busy schedule to complete your Annual Wellness Visit with me. I enjoyed our conversation and look forward to speaking with you again next year. I, as well as your care team,  appreciate your ongoing commitment to your health goals. Please review the following plan we discussed and let me know if I can assist you in the future. Your Game plan/ To Do List    Referrals: If you haven't heard from the office you've been referred to, please reach out to them at the phone provided.   Follow up Visits: Next Medicare AWV with our clinical staff: 03/01/25   Have you seen your provider in the last 6 months (3 months if uncontrolled diabetes)? Yes Next Office Visit with your provider: 03/25/24  Clinician Recommendations:  Aim for 30 minutes of exercise or brisk walking, 6-8 glasses of water, and 5 servings of fruits and vegetables each day.       This is a list of the screening recommended for you and due dates:  Health Maintenance  Topic Date Due   COVID-19 Vaccine (6 - 2024-25 season) 04/14/2023   Pneumococcal Vaccine for age over 104 (2 of 2 - PPSV23, PCV20, or PCV21) 06/25/2024*   Flu Shot  03/13/2024   Medicare Annual Wellness Visit  02/19/2025   DTaP/Tdap/Td vaccine (3 - Td or Tdap) 09/05/2031   Hepatitis B Vaccine  Aged Out   HPV Vaccine  Aged Out   Meningitis B Vaccine  Aged Out   DEXA scan (bone density measurement)  Discontinued   Zoster (Shingles) Vaccine  Discontinued  *Topic was postponed. The date shown is not the original due date.    Advanced directives: (Copy Requested) Please bring a copy of your health care power of attorney and living will to the office to be added to your chart at your convenience. You can mail to [facility] [address] [place], [place] [address] or email to [email or web address] Advance Care Planning is important because it:  [x]  Makes sure you receive the medical care that is  consistent with your values, goals, and preferences  [x]  It provides guidance to your family and loved ones and reduces their decisional burden about whether or not they are making the right decisions based on your wishes.  Follow the link provided in your after visit summary or read over the paperwork we have mailed to you to help you started getting your Advance Directives in place. If you need assistance in completing these, please reach out to us  so that we can help you!  See attachments for Preventive Care and Fall Prevention Tips.

## 2024-02-20 NOTE — Telephone Encounter (Signed)
 Copied from CRM [id number]. Topic: Clinical - Lab/Test Results >> Feb 20, 2024  8:55 AM Deanna Rivera wrote: Reason for CRM: Patient would like to discuss her urine lab results. Callback number [phone].  Message has been sent to provider to address

## 2024-02-24 ENCOUNTER — Ambulatory Visit: Payer: Self-pay

## 2024-02-24 NOTE — Telephone Encounter (Signed)
 Noted, pt scheduled for appt

## 2024-02-24 NOTE — Telephone Encounter (Signed)
 FYI Only or Action Required?: FYI only for provider.  Patient was last seen in primary care on 02/11/2024 by Lucius Krabbe, NP.  Called Nurse Triage reporting Dysuria.  Symptoms began several months ago.  Interventions attempted: Prescription medications: weekly abx and Rest, hydration, or home remedies.  Symptoms are: unchanged.  Triage Disposition: See PCP When Office is Open (Within 3 Days)  Patient/caregiver understands and will follow disposition?: Yes    Copied from CRM [id number]. Topic: Clinical - Red Word Triage >> Feb 24, 2024 10:54 AM Jayma L wrote: Red Word that prompted transfer to Nurse Triage: patient called in stated she might have a UTI and wants to be tested. Stated she goes to the bathroom a lot and is having burning when she urinates. Asking for a appt. Said she has maybe had the UTI for about a week now Reason for Disposition  Age > 50 years  Answer Assessment - Initial Assessment Questions 1. SEVERITY: How bad is the pain?  (e.g., Scale 1-10; mild, moderate, or severe)     Denies pain, just frequency 2. FREQUENCY: How many times have you had painful urination today?      3-4 times 3. PATTERN: Is pain present every time you urinate or just sometimes?      Pain sometimes 4. ONSET: When did the painful urination start?      X 1 week - endorses every week she gets abx and has been going on for months 5. FEVER: Do you have a fever? If Yes, ask: What is your temperature, how was it measured, and when did it start?     denies 6. PAST UTI: Have you had a urine infection before? If Yes, ask: When was the last time? and What happened that time?      Last week 7. CAUSE: What do you think is causing the painful urination?  (e.g., UTI, scratch, Herpes sore)     UTI 8. OTHER SYMPTOMS: Do you have any other symptoms? (e.g., blood in urine, flank pain, genital sores, urgency, vaginal discharge)     denies 9. PREGNANCY: Is there any chance you  are pregnant? When was your last menstrual period?     N/a    Scheduled patient on February 26, 2024 with PCP per pt preference d/t limited transportation accommodations living at a ALF .  Protocols used: Urination Pain - Female-A-AH

## 2024-02-26 ENCOUNTER — Encounter: Payer: Self-pay | Admitting: Family Medicine

## 2024-02-26 ENCOUNTER — Ambulatory Visit (INDEPENDENT_AMBULATORY_CARE_PROVIDER_SITE_OTHER): Admitting: Family Medicine

## 2024-02-26 VITALS — BP 140/69 | HR 67 | Temp 98.1°F | Ht 63.0 in | Wt 115.0 lb

## 2024-02-26 DIAGNOSIS — R350 Frequency of micturition: Secondary | ICD-10-CM | POA: Diagnosis not present

## 2024-02-26 DIAGNOSIS — N390 Urinary tract infection, site not specified: Secondary | ICD-10-CM

## 2024-02-26 LAB — POCT URINALYSIS DIPSTICK
Bilirubin, UA: NEGATIVE
Blood, UA: NEGATIVE
Glucose, UA: NEGATIVE
Ketones, UA: NEGATIVE
Nitrite, UA: NEGATIVE
Protein, UA: NEGATIVE
Spec Grav, UA: 1.015 (ref 1.010–1.025)
Urobilinogen, UA: NEGATIVE U/dL — AB
pH, UA: 7 (ref 5.0–8.0)

## 2024-02-27 ENCOUNTER — Ambulatory Visit: Admitting: Podiatry

## 2024-02-27 ENCOUNTER — Other Ambulatory Visit: Payer: Self-pay

## 2024-02-27 LAB — URINE CULTURE
MICRO NUMBER:: 16706575
SPECIMEN QUALITY:: ADEQUATE

## 2024-02-28 ENCOUNTER — Ambulatory Visit: Payer: Self-pay | Admitting: Family Medicine

## 2024-02-28 NOTE — Progress Notes (Signed)
 Please call patient: Please let her know that her urine is negative for infection or bacteria at this time.  This is good news

## 2024-03-02 NOTE — Progress Notes (Signed)
 Subjective  CC:  Chief Complaint  Patient presents with   Urinary Frequency    Going on for the past 3 months    HPI: Deanna Rivera is a 89 y.o. female who presents to the office today to address the problems listed above in the chief complaint. Reviewed recent records; has had recurrent e.coli uti; recently treated. However, no urinary sxs now.  HTN: taking medications. No cp   Assessment  1. Urinary frequency   2. Recurrent UTI      Plan  H/o recurrent uti:  however, now no sxs and history is vague: uncertain if had classic sxs at last visit. No urine culture was done at that visit. ? Urinary colonozation vs true infection. Education given. Recheck urine culture today.  Bp is controlled.   Follow up: as scheduled 03/25/2024  Orders Placed This Encounter  Procedures   Urine Culture   POCT Urinalysis Dipstick   No orders of the defined types were placed in this encounter.     I reviewed the patients updated PMH, FH, and SocHx.    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    Priority: High   Nontoxic multinodular goiter 09/08/2015    Priority: High   Current use of long term anticoagulation 05/16/2015    Priority: High   (HFpEF) heart failure with preserved ejection fraction (HCC) 09/13/2014    Priority: High   Pacemaker 11/09/2011    Priority: High   Impaired fasting glucose 05/23/2009    Priority: High   Long term current use of systemic steroids 03/30/2008    Priority: High   Polymyalgia rheumatica (HCC) 03/30/2008    Priority: High   Benign  essential hypertension 05/05/2007    Priority: High   Persistent atrial fibrillation (HCC) 11/06/2006    Priority: High   Pure hypercholesterolemia 11/06/2006    Priority: High   Hypothyroidism due to Hashimoto's thyroiditis, Rx Levothyroxine  06/25/2005    Priority: High   Degenerative lumbar spinal stenosis 05/30/2022    Priority: Medium    Compression fracture of body of thoracic vertebra (HCC) 02/06/2021    Priority: Medium    Primary osteoarthritis of right knee 10/23/2019    Priority: Medium    Nonrheumatic mitral valve regurgitation 09/16/2015    Priority: Medium    Recurrent UTI 03/14/2015    Priority: Medium    Sensorineural hearing loss (SNHL) of both ears 01/14/2022    Priority: Low   Neuralgia, postherpetic 07/02/2019    Priority: Low   OAB (overactive bladder) 07/02/2019    Priority: Low   Constipation 01/18/2019    Priority: Low   Protein-calorie malnutrition, severe 11/06/2022   Frequent falls 05/17/2021   Current Meds  Medication Sig   acetaminophen  (TYLENOL ) 500 MG tablet Take 500 mg by mouth every 6 (six) hours as needed for mild pain or headache.   digoxin  (LANOXIN ) 0.125 MG tablet TAKE 1 TABLET BY MOUTH DAILY   dorzolamide-timolol (COSOPT) 22.3-6.8 MG/ML ophthalmic solution Place 1 drop into both eyes 2 (two) times daily.   ELIQUIS  2.5 MG TABS  tablet TAKE 1 TABLET BY MOUTH TWICE  DAILY   furosemide  (LASIX ) 20 MG tablet Take 1 tablet (20 mg total) by mouth daily as needed.   latanoprost (XALATAN) 0.005 % ophthalmic solution Place 1 drop into both eyes at bedtime.   levothyroxine  (SYNTHROID ) 125 MCG tablet Take 1 tablet (125 mcg total) by mouth daily.   metoprolol  succinate (TOPROL -XL) 25 MG 24 hr tablet Take 0.5 tablets (12.5 mg total) by mouth daily.   polyethylene glycol (MIRALAX  / GLYCOLAX ) 17 g packet Take 17 g by mouth daily as needed for severe constipation.   predniSONE  (DELTASONE ) 1 MG tablet Take 1 tablet (1 mg total) by mouth daily.     Allergies: Patient is allergic to shellfish-derived products, epinephrine , monosodium glutamate, monosodium glutamate, ciprofloxacin, dronedarone, erythromycin, sulfa antibiotics, sulfamethoxazole-trimethoprim, and tetracyclines & related. Family History: Patient family history includes Cirrhosis (age of onset: 50) in her father; Early death (age of onset: 26) in her mother; Heart disease in her mother; Hypertension in her brother, brother, father, son, and son; Lung cancer in her brother; Other in her brother; Rheumatic fever in her mother; Scleroderma in her daughter. Social History:  Patient  reports that she has quit smoking. Her smoking use included cigarettes. She has been exposed to tobacco smoke. She has never used smokeless tobacco. She reports that she does not currently use alcohol. She reports that she does not currently use drugs.  Review of Systems: Constitutional: Negative for fever malaise or anorexia Cardiovascular: negative for chest pain Respiratory: negative for SOB or persistent cough Gastrointestinal: negative for abdominal pain  Objective  Vitals: BP (!) 140/69   Pulse 67   Temp 98.1 F (36.7 C)   Ht 5' 3 (1.6 m)   Wt 115 lb (52.2 kg) Comment: pt reported  SpO2 96%   BMI 20.37 kg/m  General: no acute distress , A&Ox3   Commons side effects, risks, benefits, and alternatives for medications and treatment plan prescribed today were discussed, and the patient expressed understanding of the given instructions. Patient is instructed to call or message via MyChart if he/she has any questions or concerns regarding our treatment plan. No barriers to understanding were identified. We discussed Red Flag symptoms and signs in detail. Patient expressed understanding regarding what to do in case of urgent or emergency type symptoms.  Medication list was reconciled, printed and provided to the patient in AVS. Patient instructions and summary information was reviewed with  the patient as documented in the AVS. This note was prepared with assistance of Dragon voice recognition software. Occasional wrong-word or sound-a-like substitutions may have occurred due to the inherent limitations of voice recognition software

## 2024-03-03 ENCOUNTER — Telehealth: Payer: Self-pay | Admitting: Internal Medicine

## 2024-03-03 NOTE — Telephone Encounter (Signed)
 I spoke with patient.  She reports she has been having palpitations for awhile but they have been worse recently and waking her up at night.  Patient will send in transmission. Patient has year follow up scheduled with B Aniceto, NP in September but is requesting to see Dr Waddell.

## 2024-03-03 NOTE — Telephone Encounter (Signed)
 Patient c/o Palpitations:  STAT if patient reporting lightheadedness, shortness of breath, or chest pain  How long have you had palpitations/irregular HR/ Afib? 2-3 mo ago Are you having the symptoms now? No  Are you currently experiencing lightheadedness, SOB or CP? No  Do you have a history of afib (atrial fibrillation) or irregular heart rhythm? Afib  Have you checked your BP or HR? (document readings if available): No  Are you experiencing any other symptoms? No

## 2024-03-04 ENCOUNTER — Encounter: Payer: Self-pay | Admitting: Podiatry

## 2024-03-04 ENCOUNTER — Ambulatory Visit (INDEPENDENT_AMBULATORY_CARE_PROVIDER_SITE_OTHER): Admitting: Podiatry

## 2024-03-04 VITALS — BP 130/80 | HR 88 | Temp 98.2°F | Resp 16 | Ht 63.0 in | Wt 115.0 lb

## 2024-03-04 DIAGNOSIS — D689 Coagulation defect, unspecified: Secondary | ICD-10-CM | POA: Diagnosis not present

## 2024-03-04 DIAGNOSIS — I999 Unspecified disorder of circulatory system: Secondary | ICD-10-CM

## 2024-03-04 DIAGNOSIS — L97511 Non-pressure chronic ulcer of other part of right foot limited to breakdown of skin: Secondary | ICD-10-CM

## 2024-03-04 NOTE — Progress Notes (Signed)
 Subjective:   Patient ID: Deanna Rivera, female   DOB: 89 y.o.   MRN: [id number]   HPI Patient presents with caregiver in wheelchair stating that she bumped her right big toe and that it has been draining and that she also has a small area between the 4th and 5th digits   ROS      Objective:  Physical Exam  Neurovascular status significantly diminished with significant venous disease with patient who does not weight-bear and is in a wheelchair with an area on the right hallux medial side measuring about 5 x 5 mm that does not have subcutaneous exposure and is probably trauma from bumping her foot with area of irritation between the 4th and 5th digits right that is not draining currently     Assessment:  Low-grade ulceration right hallux secondary to trauma with trauma between the 4th and 5th toes but stable H     Plan:  NP reviewed and I applied cushioning with Silvadene to both areas and instructed on open toed shoes and soaks.  I offered antibiotic she denies and refuses an antibiotic at the current time I we will see her back and see how this responds.  I gave strict instructions if any changes were to occur to reappoint immediately

## 2024-03-04 NOTE — Telephone Encounter (Signed)
 Message from [employer] received:  Unscheduled remote transmission: PII, clinic requested, palpitations - route to triage Normal device function.  Presenting irregular, hx of AF, Eliquis  No new events Follow up as scheduled [place], [facility] __________________________________________________________________________  Called patient to speak with them about their symptoms. No answer. LMTCB  Agree with [employer] assessment of transmission. Patient likely symptomatic from occasional fast ventricular response related to atrial fibrillation  BB and OAC noted in patient's active med list

## 2024-03-06 NOTE — Telephone Encounter (Signed)
 Spoke with patient.  States only symptoms are that she has noticed more palpitations and fatigue over last few months.  She requests to see Waddell sooner than her September appointment with Daphne Barrack.   Appears we may have an opening with Dr. Waddell on 8/14 in the afternoon.  Will forward to our scheduling team to confirm and call her back to set up.   Patient aware that if symptoms progress in interim to call us  back, if severe, go to ER.

## 2024-03-19 ENCOUNTER — Institutional Professional Consult (permissible substitution) (INDEPENDENT_AMBULATORY_CARE_PROVIDER_SITE_OTHER): Admitting: Otolaryngology

## 2024-03-25 ENCOUNTER — Ambulatory Visit (INDEPENDENT_AMBULATORY_CARE_PROVIDER_SITE_OTHER): Admitting: Family Medicine

## 2024-03-25 ENCOUNTER — Encounter: Payer: Self-pay | Admitting: Family Medicine

## 2024-03-25 VITALS — BP 146/84 | HR 64 | Temp 98.2°F | Ht 63.0 in | Wt 117.0 lb

## 2024-03-25 DIAGNOSIS — R300 Dysuria: Secondary | ICD-10-CM

## 2024-03-25 DIAGNOSIS — I5032 Chronic diastolic (congestive) heart failure: Secondary | ICD-10-CM

## 2024-03-25 DIAGNOSIS — I10 Essential (primary) hypertension: Secondary | ICD-10-CM | POA: Diagnosis not present

## 2024-03-25 DIAGNOSIS — R600 Localized edema: Secondary | ICD-10-CM | POA: Diagnosis not present

## 2024-03-25 DIAGNOSIS — E063 Autoimmune thyroiditis: Secondary | ICD-10-CM

## 2024-03-25 DIAGNOSIS — I495 Sick sinus syndrome: Secondary | ICD-10-CM | POA: Diagnosis not present

## 2024-03-25 DIAGNOSIS — F321 Major depressive disorder, single episode, moderate: Secondary | ICD-10-CM | POA: Insufficient documentation

## 2024-03-25 DIAGNOSIS — T380X5A Adverse effect of glucocorticoids and synthetic analogues, initial encounter: Secondary | ICD-10-CM | POA: Insufficient documentation

## 2024-03-25 DIAGNOSIS — D84821 Immunodeficiency due to drugs: Secondary | ICD-10-CM

## 2024-03-25 DIAGNOSIS — Z7952 Long term (current) use of systemic steroids: Secondary | ICD-10-CM

## 2024-03-25 LAB — COMPREHENSIVE METABOLIC PANEL WITH GFR
ALT: 21 U/L (ref 0–35)
AST: 33 U/L (ref 0–37)
Albumin: 3.6 g/dL (ref 3.5–5.2)
Alkaline Phosphatase: 48 U/L (ref 39–117)
BUN: 18 mg/dL (ref 6–23)
CO2: 35 meq/L — ABNORMAL HIGH (ref 19–32)
Calcium: 10.4 mg/dL (ref 8.4–10.5)
Chloride: 97 meq/L (ref 96–112)
Creatinine, Ser: 0.26 mg/dL — ABNORMAL LOW (ref 0.40–1.20)
GFR: 97.54 mL/min (ref >60.00)
Glucose, Bld: 83 mg/dL (ref 70–99)
Potassium: 4 meq/L (ref 3.5–5.1)
Sodium: 137 meq/L (ref 135–145)
Total Bilirubin: 0.5 mg/dL (ref 0.2–1.2)
Total Protein: 8.4 g/dL — ABNORMAL HIGH (ref 6.0–8.3)

## 2024-03-25 LAB — LIPID PANEL
Cholesterol: 190 mg/dL (ref 0–200)
HDL: 66.1 mg/dL (ref >39.00)
LDL Cholesterol: 109 mg/dL — ABNORMAL HIGH (ref 0–99)
NonHDL: 123.75
Total CHOL/HDL Ratio: 3
Triglycerides: 72 mg/dL (ref 0.0–149.0)
VLDL: 14.4 mg/dL (ref 0.0–40.0)

## 2024-03-25 LAB — TSH: TSH: 7.66 u[IU]/mL — ABNORMAL HIGH (ref 0.35–5.50)

## 2024-03-25 NOTE — Progress Notes (Signed)
 Subjective  CC:  Chief Complaint  Patient presents with   Hypothyroidism   Hypertension    HPI: Deanna Rivera is a 89 y.o. female who presents to the office today to address the problems listed above in the chief complaint. Discussed the use of AI scribe software for clinical note transcription with the patient, who gave verbal consent to proceed.  History of Present Illness Deanna Rivera is an 89 year old female who presents for a three-month follow-up to monitor her health status.  Hypothyroidism on levothyroxine  with normal TSH in May.  Will recheck to ensure it is stable.  Chronic edema, nonambulatory due to neurologic condition, chronic heart failure on Lasix : Currently remains same.  No change in symptoms.  Tolerating medications.  No chest pain or shortness of breath.  Hypertension with fluctuations in blood pressure.  Normal today.  Intolerant to multiple medications.  Tolerating beta-blocker.  Husband with cancer and now retinal vein occlusion and vision loss.  This is a tremendous change in his functional capabilities.  They live independently and remain adamant that they will remain independent.  They report they can hire an caretakers and help when needed.  She experiences burning sensations during urination, which began shortly after her last visit. Despite a negative urine test last month, she is concerned about a possible urinary tract infection. She reports no changes in urine color or odor. No other urinary symptoms such as leakage or frequency are present.  She wakes up at night coughing and spitting up phlegm, which she associates with dairy intake. After eliminating dairy products, her symptoms have improved. She is concerned about maintaining her protein intake without dairy, as she finds it difficult to chew meat. She consumes peanut butter and beans as alternative protein sources.  I reviewed lab work from the last year.  She is due to recheck thyroid  and  chemistry panels.  He has had history of hyper calcium Mia.    Assessment  1. Benign essential hypertension   2. Bilateral lower extremity edema   3. Hypothyroidism due to Hashimoto's thyroiditis, Rx Levothyroxine    4. SSS (sick sinus syndrome) (HCC)   5. Chronic heart failure with preserved ejection fraction (HCC)   6. Depression, major, single episode, moderate (HCC) Chronic  7. Immunosuppression due to chronic steroid use (HCC)   8. Dysuria      Plan  Assessment and Plan Assessment & Plan Urinary tract infection symptoms (dysuria) Reports burning sensation during urination for several weeks. Previous urine test was negative for infection. - Obtain urine sample for urine culture   Chronic foot wound, healing Chronic foot wound with scab formation.  - Apply Vaseline to the wound to prevent dryness.  Hypothyroidism for recheck today on levothyroxine .  Monitor renal function and calcium levels.  Stress and anxiety related to caregiving and health decline Experiencing stress and anxiety due to caregiving responsibilities and personal health decline. Declined tranquilizers due to concerns about side effects. Occasionally takes additional metoprolol  for stress-related blood pressure spikes.  Chronic lower extremity edema Chronic lower extremity edema likely related to limited mobility.  Continue Lasix   Blood pressure is controlled    Follow up: 6 months for recheck Orders Placed This Encounter  Procedures   Urine Culture   Comprehensive metabolic panel with GFR   TSH   Lipid panel   No orders of the defined types were placed in this encounter.    I reviewed the patients updated PMH, FH, and SocHx.  Patient Active Problem List  Diagnosis Date Noted   Bilateral lower extremity edema 01/15/2023    Priority: High   Pulmonary hypertension, unspecified (HCC) 01/15/2023    Priority: High   Age-related osteoporosis with current pathological fracture 02/06/2021     Priority: High   SSS (sick sinus syndrome) (HCC) 02/11/2019    Priority: High   Status post placement of cardiac pacemaker 07/08/2018    Priority: High   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    Priority: High   Nontoxic multinodular goiter 09/08/2015    Priority: High   Current use of long term anticoagulation 05/16/2015    Priority: High   (HFpEF) heart failure with preserved ejection fraction (HCC) 09/13/2014    Priority: High   Pacemaker 11/09/2011    Priority: High   Impaired fasting glucose 05/23/2009    Priority: High   Long term current use of systemic steroids 03/30/2008    Priority: High   Polymyalgia rheumatica (HCC) 03/30/2008    Priority: High   Benign essential hypertension 05/05/2007    Priority: High   Persistent atrial fibrillation (HCC) 11/06/2006    Priority: High   Pure hypercholesterolemia 11/06/2006    Priority: High   Hypothyroidism due to Hashimoto's thyroiditis, Rx Levothyroxine 06/25/2005    Priority: High   Degenerative lumbar spinal stenosis 05/30/2022    Priority: Medium    Compression fracture of body of thoracic vertebra (HCC) 02/06/2021    Priority: Medium    Primary osteoarthritis of right knee 10/23/2019    Priority: Medium    Nonrheumatic mitral valve regurgitation 09/16/2015    Priority: Medium    Recurrent UTI 03/14/2015    Priority: Medium    Sensorineural hearing loss (SNHL) of both ears 01/14/2022    Priority: Low   Neuralgia, postherpetic 07/02/2019    Priority: Low   OAB (overactive bladder) 07/02/2019    Priority: Low   Constipation 01/18/2019    Priority: Low   Depression, major, single episode, moderate (HCC) 03/25/2024   Immunosuppression due to chronic steroid use (HCC) 03/25/2024   Protein-calorie malnutrition, severe 11/06/2022   Frequent falls 05/17/2021   No outpatient medications have been marked as taking for the 03/25/24 encounter (Office Visit) with Jodie Lavern CROME, MD.   Allergies: Patient is allergic to  shellfish-derived products, epinephrine, monosodium glutamate, monosodium glutamate, ciprofloxacin, dronedarone, erythromycin, sulfa antibiotics, sulfamethoxazole-trimethoprim, and tetracyclines & related. Family History: Patient family history includes Cirrhosis (age of onset: 4) in her father; Early death (age of onset: 78) in her mother; Heart disease in her mother; Hypertension in her brother, brother, father, son, and son; Lung cancer in her brother; Other in her brother; Rheumatic fever in her mother; Scleroderma in her daughter. Social History:  Patient  reports that she has quit smoking. Her smoking use included cigarettes. She has been exposed to tobacco smoke. She has never used smokeless tobacco. She reports that she does not currently use alcohol. She reports that she does not currently use drugs.  Review of Systems: Constitutional: Negative for fever malaise or anorexia Cardiovascular: negative for chest pain Respiratory: negative for SOB or persistent cough Gastrointestinal: negative for abdominal pain  Objective  Vitals: BP (!) 146/84   Pulse 64   Temp 98.2 F (36.8 C)   Ht 5' 3 (1.6 m)   Wt 117 lb (53.1 kg)   SpO2 98%   BMI 20.73 kg/m  General: no acute distress , A&Ox3, in wheelchair Lower extremities with chronic nonpitting edema.  Scab on left great toe without signs of infection  Commons side effects, risks, benefits, and alternatives for medications and treatment plan prescribed today were discussed, and the patient expressed understanding of the given instructions. Patient is instructed to call or message via MyChart if he/she has any questions or concerns regarding our treatment plan. No barriers to understanding were identified. We discussed Red Flag symptoms and signs in detail. Patient expressed understanding regarding what to do in case of urgent or emergency type symptoms.  Medication list was reconciled, printed and provided to the patient in AVS. Patient  instructions and summary information was reviewed with the patient as documented in the AVS. This note was prepared with assistance of Dragon voice recognition software. Occasional wrong-word or sound-a-like substitutions may have occurred due to the inherent limitations of voice recognition software

## 2024-03-26 ENCOUNTER — Encounter: Payer: Self-pay | Admitting: Internal Medicine

## 2024-03-26 ENCOUNTER — Ambulatory Visit: Attending: Internal Medicine | Admitting: Internal Medicine

## 2024-03-26 VITALS — BP 152/77 | HR 68 | Ht 63.0 in | Wt 117.0 lb

## 2024-03-26 DIAGNOSIS — I5043 Acute on chronic combined systolic (congestive) and diastolic (congestive) heart failure: Secondary | ICD-10-CM | POA: Insufficient documentation

## 2024-03-26 DIAGNOSIS — I442 Atrioventricular block, complete: Secondary | ICD-10-CM | POA: Insufficient documentation

## 2024-03-26 LAB — URINE CULTURE
MICRO NUMBER:: 16826055
Result:: NO GROWTH
SPECIMEN QUALITY:: ADEQUATE

## 2024-03-26 NOTE — Patient Instructions (Addendum)
 Medication Instructions:  Your physician recommends that you continue on your current medications as directed. Please refer to the Current Medication list given to you today.  *If you need a refill on your cardiac medications before your next appointment, please call your pharmacy*  Lab Work: None ordered.  You may go to any [facility] Location for your lab work:  [facility] - [address] ([facility]) - [address] [phone] [address]  [place] - [address]   [place]  - [address]   [place] - [address] - [address] [facility]  - [address] ([facility]   If you have labs (blood work) drawn today and your tests are completely normal, you will receive your results only by: [employer] (if you have MyChart)  If you have any lab test that is abnormal or we need to change your treatment, we will call you or send a MyChart message to review the results.  Testing/Procedures: Your physician has requested that you have an echocardiogram. Echocardiography is a painless test that uses sound waves to create images of your heart. It provides your doctor with information about the size and shape of your heart and how well your heart's chambers and valves are working. This procedure takes approximately one hour. There are no restrictions for this procedure. Please do NOT wear cologne, perfume, aftershave, or lotions (deodorant is allowed). Please arrive 15 minutes prior to your appointment time.  Please note: We ask at that you not bring children with you during ultrasound (echo/ vascular) testing. Due to room size and safety concerns, children are not allowed in the ultrasound rooms during exams. Our front office staff cannot provide observation of children in our lobby area while testing is being conducted. An adult accompanying a patient  to their appointment will only be allowed in the ultrasound room at the discretion of the ultrasound technician under special circumstances. We apologize for any inconvenience.   Follow-Up: At [facility], you and your health needs are our priority.  As part of our continuing mission to provide you with exceptional heart care, we have created designated Provider Care Teams.  These Care Teams include your primary Cardiologist (physician) and Advanced Practice Providers (APPs -  Physician Assistants and Nurse Practitioners) who all work together to provide you with the care you need, when you need it.  Your next appointment:   1 year(s)  The format for your next appointment:   In Person  Provider:   Donnice Primus, MD or one of the following Advanced Practice Providers on your designated Care Team:   Charlies Arthur, [place] Ozell Jodie Passey, [place] Leotis Barrack, NP  Note: Remote monitoring is used to monitor your Pacemaker/ ICD from home. This monitoring reduces the number of office visits required to check your device to one time per year. It allows us  to keep an eye on the functioning of your device to ensure it is working properly.

## 2024-03-26 NOTE — Progress Notes (Signed)
 HPI Ms. Colee returns today for ongoing evaluation of her heart block, PAF, chronic peripheral edema, and HTN, s/ PPM insertion. She has longstanding peripheral edema and venous insufficiency. She has chronic dyspnea. No syncope.  Allergies  Allergen Reactions   Shellfish-Derived Products Shortness Of Breath and Other (See Comments)    Lips go numb   Epinephrine  Other (See Comments) and Hypertension    Makes me feel wired   Monosodium Glutamate Other (See Comments)    other   Monosodium Glutamate Hypertension and Other (See Comments)    Makes me feel badly  Other Reaction(s): Other (See Comments)  other, other   Ciprofloxacin Other (See Comments)    Allergic reaction not recalled  Other Reaction(s): Other (See Comments)  Other reaction(s): Intolerance   Dronedarone     Other Reaction(s): Other (See Comments)  Leg swelling, Leg swelling   Erythromycin Other (See Comments)    Allergic reaction not recalled   Sulfa Antibiotics Other (See Comments)    Allergic reaction not recalled   Sulfamethoxazole-Trimethoprim    Tetracyclines & Related Other (See Comments)    Allergic reaction not recalled     Current Outpatient Medications  Medication Sig Dispense Refill   acetaminophen  (TYLENOL ) 500 MG tablet Take 500 mg by mouth every 6 (six) hours as needed for mild pain or headache.     digoxin  (LANOXIN ) 0.125 MG tablet TAKE 1 TABLET BY MOUTH DAILY 90 tablet 2   dorzolamide-timolol (COSOPT) 22.3-6.8 MG/ML ophthalmic solution Place 1 drop into both eyes 2 (two) times daily.     ELIQUIS  2.5 MG TABS tablet TAKE 1 TABLET BY MOUTH TWICE  DAILY 60 tablet 11   furosemide  (LASIX ) 20 MG tablet Take 1 tablet (20 mg total) by mouth daily as needed. 30 tablet 2   latanoprost (XALATAN) 0.005 % ophthalmic solution Place 1 drop into both eyes at bedtime.     levothyroxine  (SYNTHROID ) 125 MCG tablet Take 1 tablet (125 mcg total) by mouth daily. 30 tablet 1   metoprolol  succinate  (TOPROL -XL) 25 MG 24 hr tablet Take 0.5 tablets (12.5 mg total) by mouth daily. 45 tablet 3   polyethylene glycol (MIRALAX  / GLYCOLAX ) 17 g packet Take 17 g by mouth daily as needed for severe constipation.     predniSONE  (DELTASONE ) 1 MG tablet Take 1 tablet (1 mg total) by mouth daily. 90 tablet 3   No current facility-administered medications for this visit.     Past Medical History:  Diagnosis Date   Cataract    Hypertension    Moderate aortic stenosis    Moderate mitral regurgitation    Persistent atrial fibrillation (HCC)    PMR (polymyalgia rheumatica) (HCC)    Second degree AV block    Sick sinus syndrome (HCC)    Thyroid  disease     ROS:   All systems reviewed and negative except as noted in the HPI.   Past Surgical History:  Procedure Laterality Date   ABDOMINAL HYSTERECTOMY     APPENDECTOMY     CESAREAN SECTION     x 3   CHOLECYSTECTOMY     PACEMAKER GENERATOR CHANGE  11/20/2018   [employer] Accolade MRI PPM implanted in [place]    PACEMAKER IMPLANT     PILONIDAL CYST EXCISION       Family History  Problem Relation Age of Onset   Early death Mother 60   Rheumatic fever Mother    Heart disease Mother  valve replacement   Cirrhosis Father 56   Hypertension Father    Hypertension Brother    Lung cancer Brother    Hypertension Brother    Other Brother        smoker   Scleroderma Daughter    Hypertension Son    Hypertension Son      Social History   Socioeconomic History   Marital status: Married    Spouse name: Not on file   Number of children: Not on file   Years of education: Not on file   Highest education level: Not on file  Occupational History   Occupation: Retired     Comment: Midwife   Tobacco Use   Smoking status: Former    Types: Cigarettes    Passive exposure: Past   Smokeless tobacco: Never  Vaping Use   Vaping status: Never Used  Substance and Sexual Activity   Alcohol use: Not Currently    Drug use: Not Currently   Sexual activity: Not Currently    Birth control/protection: Post-menopausal  Other Topics Concern   Not on file  Social History Narrative   ** Merged History Encounter **       Recently moved from [place] . In independent living with her husband. Daughter died from scleroderma complications. Hx of falls, but last was > 1.5 years ago. 01/18/2019    Social Drivers of Health   Financial Resource Strain: Low Risk  (02/20/2024)   Overall Financial Resource Strain (CARDIA)    Difficulty of Paying Living Expenses: Not hard at all  Food Insecurity: No Food Insecurity (02/20/2024)   Hunger Vital Sign    Worried About Running Out of Food in the Last Year: Never true    Ran Out of Food in the Last Year: Never true  Transportation Needs: No Transportation Needs (02/20/2024)   PRAPARE - Administrator, Civil Service (Medical): No    Lack of Transportation (Non-Medical): No  Physical Activity: Sufficiently Active (02/20/2024)   Exercise Vital Sign    Days of Exercise per Week: 5 days    Minutes of Exercise per Session: 30 min  Stress: No Stress Concern Present (02/20/2024)   [employer] of Occupational Health - Occupational Stress Questionnaire    Feeling of Stress: Only a little  Social Connections: Moderately Integrated (02/20/2024)   Social Connection and Isolation Panel    Frequency of Communication with Friends and Family: More than three times a week    Frequency of Social Gatherings with Friends and Family: More than three times a week    Attends Religious Services: More than 4 times per year    Active Member of [employer] or Organizations: No    Attends Banker Meetings: Never    Marital Status: Married  Catering manager Violence: Not At Risk (02/20/2024)   Humiliation, Afraid, Rape, and Kick questionnaire    Fear of Current or Ex-Partner: No    Emotionally Abused: No    Physically Abused: No    Sexually Abused: No     BP (!) 152/77    Pulse 68   Ht 5' 3 (1.6 m)   Wt 117 lb (53.1 kg)   SpO2 99%   BMI 20.73 kg/m   Physical Exam:  Well appearing NAD HEENT: Unremarkable Neck:  No JVD, no thyromegally Lymphatics:  No adenopathy Back:  No CVA tenderness Lungs:  Clear HEART:  Regular rate rhythm, no murmurs, no rubs, no clicks Abd:  soft, positive bowel sounds, no  organomegally, no rebound, no guarding Ext:  2 plus pulses, 3+ peripheral edema, no cyanosis, no clubbing Skin:  No rashes no nodules Neuro:  CN II through XII intact, motor grossly intact  EKG - atrial fib with a controlled VR and ventricular pacing  DEVICE  Normal device function.  See PaceArt for details.   Assess/Plan:  Atrial fib - this is now permanent. Her VR is well controlled.  Sinus node dysfunction - she is now in atrial fib all of the time. No symptoms of palpitations. PPM -her [employer] device is working normal. Peripheral edema - this appears worse.  We discussed the pathophysiology and I encouraged her to elevate the foot of her bed, wear support stockings and avoid salty food.   Danelle Ysenia Filice,MD

## 2024-03-29 ENCOUNTER — Ambulatory Visit: Payer: Self-pay | Admitting: Family Medicine

## 2024-03-29 NOTE — Progress Notes (Signed)
 Please call patient:let her know that her urine test is normal; there is no infection at this time.  Please ensure that she is taking levothyroxine  125mcg daily. I am wondering if she is taking it or has the correct dose because her thyroid  test is off a little again. Other labs are stable. Let me know what she says about her levothyroxine  dose.

## 2024-03-30 ENCOUNTER — Other Ambulatory Visit: Payer: Self-pay

## 2024-03-30 MED ORDER — LEVOTHYROXINE SODIUM 125 MCG PO TABS: 125.0000 ug | ORAL_TABLET | Freq: Every day | ORAL | 1 refills | Status: DC

## 2024-04-05 ENCOUNTER — Other Ambulatory Visit: Payer: Self-pay | Admitting: Cardiology

## 2024-04-09 ENCOUNTER — Ambulatory Visit (HOSPITAL_COMMUNITY)
Admission: RE | Admit: 2024-04-09 | Discharge: 2024-04-09 | Disposition: A | Discharge status: Home / Self Care | Source: Ambulatory Visit | Attending: Internal Medicine | Admitting: Internal Medicine

## 2024-04-09 DIAGNOSIS — I5043 Acute on chronic combined systolic (congestive) and diastolic (congestive) heart failure: Secondary | ICD-10-CM | POA: Diagnosis not present

## 2024-04-09 LAB — ECHOCARDIOGRAM COMPLETE
AR max vel: 0.74 cm2
AV Area VTI: 0.91 cm2
AV Area mean vel: 0.72 cm2
AV Mean grad: 8.5 mmHg
AV Peak grad: 14.8 mmHg
Ao pk vel: 1.92 m/s
Area-P 1/2: 4.33 cm2
S' Lateral: 2.2 cm

## 2024-04-14 ENCOUNTER — Telehealth: Payer: Self-pay | Admitting: Internal Medicine

## 2024-04-14 DIAGNOSIS — I350 Nonrheumatic aortic (valve) stenosis: Secondary | ICD-10-CM

## 2024-04-14 NOTE — Telephone Encounter (Signed)
 Patient called requesting her echo results. Informed patient Dr. Waddell has not reviewed the results yet. Will forward to Dr. Waddell to review and have his nurse follow-up with patient with any recommendations.

## 2024-04-14 NOTE — Telephone Encounter (Signed)
  Patient is calling to get echo result

## 2024-04-17 NOTE — Telephone Encounter (Signed)
 Deanna Ozell Barter, PA-C to Me    04/17/24  3:33 PM Her ejection fraction looks OK;  Her Aortic Stenosis has gotten worse and is now described as "severe."     This could potentially be contributing to her edema, but given that she is non-ambulatory it is likely multi factorial.   If she would like to be seen by structural to discuss whether or not she is a candidate for procedures can send that referral.     Otherwise, if she would like to continue conservative management on her current therapy that is also reasonable.  Would encourage her to take her as needed lasix  for a day or two if she feels her edema is worse.  The patient has been notified of the result and verbalized understanding.  All questions (if any) were answered. Deanna Lerner [place], RN 04/17/2024 4:04 PM   Patient okay with referral to structural.

## 2024-04-17 NOTE — Telephone Encounter (Signed)
 Called patient back about message. Patient is extremely disappointed in not getting results yet. Patient stated it has been 10 days, corrected her, letting her know it's been 8. Will send message to ordering provider and his APP to see if someone can get results to patient.

## 2024-04-17 NOTE — Telephone Encounter (Signed)
Pt calling back to f/u on Echo results. Please advise

## 2024-04-20 ENCOUNTER — Other Ambulatory Visit: Payer: Self-pay

## 2024-04-20 DIAGNOSIS — I350 Nonrheumatic aortic (valve) stenosis: Secondary | ICD-10-CM

## 2024-04-20 NOTE — Telephone Encounter (Signed)
 Referral placed.

## 2024-04-26 ENCOUNTER — Other Ambulatory Visit: Payer: Self-pay | Admitting: Internal Medicine

## 2024-04-28 ENCOUNTER — Ambulatory Visit: Admitting: Pulmonary Disease

## 2024-04-28 NOTE — Progress Notes (Unsigned)
 Patient ID: Deanna Rivera MRN: [id number] DOB/AGE: Jan 20, 1933 89 y.o.  Primary Care Physician:Andy, Lavern CROME, MD Primary Cardiologist: Waddell Referring provider: Lesia  CC:  Aortic valvular disease management     FOCUSED PROBLEM LIST:   Aortic stenosis PLFLG, AVA 0.74, MG 8, DI 0.29, SVI 24, EF 60 to 65%, TTE August 2025 EKG atrial fibrillation with ventricular pacing and occasional PVCs Mitral regurgitation Mild to moderate, TTE August 2025 Tricuspid regurgitation Moderate, TTE August 2025 Sick sinus syndrome Status post PPM 2020 Persistent atrial fibrillation On low-dose Eliquis  Hyperlipidemia Aortic atherosclerosis Chest CT 2024 Inclusion body myositis Wheelchair-bound Followed by neurology BMI 20/BSA 1.17 April 2024:  Patient consents to use of AI scribe. The patient is an 89 year old female with the above listed medical problems referred for recommendations regarding the patient's aortic valvular disease.  He has been followed by the EP division due to sick sinus syndrome.  She had been seen by the advanced heart failure division in June 2024 due to pulmonary hypertension.  This was thought to be due to group 2 pulmonary hypertension.  Spironolactone  was added to her regimen for diastolic heart failure.  She does not thought to be a candidate for advanced therapies.  The patient was seen by EP recently.  She reported chronic stable dyspnea that is longstanding.  An echocardiogram was performed which demonstrated significant aortic valvular disease and she is referred for further recommendations.  No episodes of syncope, dizziness, or significant bleeding while on anticoagulation. No lightheadedness during transfers or exercises.  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 She uses a wheelchair for mobility and has experienced over sixty falls, with a significant fall two years ago causing a vertebral fracture. She  is unable to stand or walk without assistance and requires a therapist for transfers between chairs.  She experiences fatigue and is tired most of the time, even with minimal activity. She attempts to exercise but finds it increasingly difficult. She experiences dyspnea a couple of times a day, especially after prolonged sitting, but not during transfers or exercises. She also has orthopnea and requires assistance to adjust her position in bed.          Past Medical History:  Diagnosis Date   Cataract    Hypertension    Moderate aortic stenosis    Moderate mitral regurgitation    Persistent atrial fibrillation (HCC)    PMR (polymyalgia rheumatica) (HCC)    Second degree AV block    Sick sinus syndrome (HCC)    Thyroid  disease     Past Surgical History:  Procedure Laterality Date   ABDOMINAL HYSTERECTOMY     APPENDECTOMY     CESAREAN SECTION     x 3   CHOLECYSTECTOMY     PACEMAKER GENERATOR CHANGE  11/20/2018   [employer] Accolade MRI PPM implanted in [place]    PACEMAKER IMPLANT     PILONIDAL CYST EXCISION      Family History  Problem Relation Age of Onset   Early death Mother 17   Rheumatic fever Mother    Heart disease Mother        valve replacement   Cirrhosis Father 69   Hypertension Father    Hypertension Brother    Lung cancer Brother    Hypertension Brother    Other Brother        smoker   Scleroderma Daughter    Hypertension Son    Hypertension Son  Social History   Socioeconomic History   Marital status: Married    Spouse name: Not on file   Number of children: Not on file   Years of education: Not on file   Highest education level: Not on file  Occupational History   Occupation: Retired     Comment: Midwife   Tobacco Use   Smoking status: Former    Types: Cigarettes    Passive exposure: Past   Smokeless tobacco: Never  Vaping Use   Vaping status: Never Used  Substance and Sexual Activity   Alcohol use: Not  Currently   Drug use: Not Currently   Sexual activity: Not Currently    Birth control/protection: Post-menopausal  Other Topics Concern   Not on file  Social History Narrative   ** Merged History Encounter **       Recently moved from [place] . In independent living with her husband. Daughter died from scleroderma complications. Hx of falls, but last was > 1.5 years ago. 01/18/2019    Social Drivers of Health   Financial Resource Strain: Low Risk  (02/20/2024)   Overall Financial Resource Strain (CARDIA)    Difficulty of Paying Living Expenses: Not hard at all  Food Insecurity: No Food Insecurity (02/20/2024)   Hunger Vital Sign    Worried About Running Out of Food in the Last Year: Never true    Ran Out of Food in the Last Year: Never true  Transportation Needs: No Transportation Needs (02/20/2024)   PRAPARE - Administrator, Civil Service (Medical): No    Lack of Transportation (Non-Medical): No  Physical Activity: Sufficiently Active (02/20/2024)   Exercise Vital Sign    Days of Exercise per Week: 5 days    Minutes of Exercise per Session: 30 min  Stress: No Stress Concern Present (02/20/2024)   [employer] of Occupational Health - Occupational Stress Questionnaire    Feeling of Stress: Only a little  Social Connections: Moderately Integrated (02/20/2024)   Social Connection and Isolation Panel    Frequency of Communication with Friends and Family: More than three times a week    Frequency of Social Gatherings with Friends and Family: More than three times a week    Attends Religious Services: More than 4 times per year    Active Member of [employer] or Organizations: No    Attends Banker Meetings: Never    Marital Status: Married  Catering manager Violence: Not At Risk (02/20/2024)   Humiliation, Afraid, Rape, and Kick questionnaire    Fear of Current or Ex-Partner: No    Emotionally Abused: No    Physically Abused: No    Sexually Abused: No      Prior to Admission medications   Medication Sig Start Date End Date Taking? Authorizing Provider  acetaminophen  (TYLENOL ) 500 MG tablet Take 500 mg by mouth every 6 (six) hours as needed for mild pain or headache.    [provider]  digoxin  (LANOXIN ) 0.125 MG tablet TAKE 1 TABLET BY MOUTH DAILY 07/19/23   Waddell Danelle ORN, MD  dorzolamide-timolol (COSOPT) 22.3-6.8 MG/ML ophthalmic solution Place 1 drop into both eyes 2 (two) times daily. 03/30/21   [provider]  ELIQUIS  2.5 MG TABS tablet TAKE 1 TABLET BY MOUTH TWICE  DAILY 07/08/23   Waddell Danelle ORN, MD  furosemide  (LASIX ) 20 MG tablet TAKE 1 TABLET BY MOUTH DAILY AS NEEDED 04/06/24   Sabharwal, Aditya, DO  latanoprost (XALATAN) 0.005 % ophthalmic solution Place 1  drop into both eyes at bedtime. 03/13/21   [provider]  levothyroxine  (SYNTHROID ) 125 MCG tablet Take 1 tablet (125 mcg total) by mouth daily. 03/30/24   Jodie Lavern CROME, MD  metoprolol  succinate (TOPROL -XL) 25 MG 24 hr tablet Take 0.5 tablets (12.5 mg total) by mouth daily. 12/04/23   Jodie Lavern CROME, MD  polyethylene glycol (MIRALAX  / GLYCOLAX ) 17 g packet Take 17 g by mouth daily as needed for severe constipation. 11/07/22   Bryn Bernardino NOVAK, MD  predniSONE  (DELTASONE ) 1 MG tablet Take 1 tablet (1 mg total) by mouth daily. 09/25/23   Jodie Lavern CROME, MD    Allergies  Allergen Reactions   Shellfish-Derived Products Shortness Of Breath and Other (See Comments)    Lips go numb   Epinephrine  Other (See Comments) and Hypertension    Makes me feel wired   Monosodium Glutamate Other (See Comments)    other   Monosodium Glutamate Hypertension and Other (See Comments)    Makes me feel badly  Other Reaction(s): Other (See Comments)  other, other   Ciprofloxacin Other (See Comments)    Allergic reaction not recalled  Other Reaction(s): Other (See Comments)  Other reaction(s): Intolerance   Dronedarone     Other Reaction(s): Other (See  Comments)  Leg swelling, Leg swelling   Erythromycin Other (See Comments)    Allergic reaction not recalled   Sulfa Antibiotics Other (See Comments)    Allergic reaction not recalled   Sulfamethoxazole-Trimethoprim    Tetracyclines & Related Other (See Comments)    Allergic reaction not recalled    REVIEW OF SYSTEMS:  General: no fevers/chills/night sweats Eyes: no blurry vision, diplopia, or amaurosis ENT: no sore throat or hearing loss Resp: no cough, wheezing, or hemoptysis CV: no edema or palpitations GI: no abdominal pain, nausea, vomiting, diarrhea, or constipation GU: no dysuria, frequency, or hematuria Skin: no rash Neuro: no headache, numbness, tingling, or weakness of extremities Musculoskeletal: no joint pain or swelling Heme: no bleeding, DVT, or easy bruising Endo: no polydipsia or polyuria  BP (!) 160/82   Pulse 65   Ht 5' 3.5 (1.613 m)   Wt 117 lb (53.1 kg)   SpO2 96%   BMI 20.40 kg/m   PHYSICAL EXAM: GEN:  AO x 3 in no acute distress, frail HEENT: normal Dentition: Normal Neck: JVP normal. +2 carotid upstrokes without bruits. No thyromegaly. Lungs: equal expansion, clear bilaterally CV: Apex is discrete and nondisplaced, RRR with 3/6 systolic ejection murmur Abd: soft, non-tender, non-distended; no bruit; positive bowel sounds Ext: no edema, ecchymoses, or cyanosis Vascular: 2+ femoral pulses, 2+ radial pulses       Skin: warm and dry without rash Neuro: CN II-XII grossly intact; motor and sensory grossly intact    DATA AND STUDIES:  EKG:EKG atrial fibrillation with ventricular pacing and occasional PVCs  EKG Interpretation Date/Time:    Ventricular Rate:    PR Interval:    QRS Duration:    QT Interval:    QTC Calculation:   R Axis:      Text Interpretation:          CARDIAC STUDIES: Refer to CV Procedures and Imaging Tabs  11/26/2023: Hemoglobin 13.7; Platelets 236.0 03/25/2024: ALT 21; BUN 18; Creatinine, Ser 0.26; Potassium 4.0;  Sodium 137; TSH 7.66   STS RISK CALCULATOR: Pending  NHYA CLASS: 1    ASSESSMENT AND PLAN:   1. Nonrheumatic aortic valve stenosis   2. Nonrheumatic mitral valve regurgitation   3. Nonrheumatic  tricuspid valve regurgitation   4. SSS (sick sinus syndrome) (HCC)   5. Persistent atrial fibrillation (HCC)   6. Secondary hypercoagulable state (HCC)   7. Hyperlipidemia LDL goal <70   8. Aortic atherosclerosis (HCC)     Aortic stenosis: The patient has a very low functional capacity.  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  I think the threshold for an aortic valve intervention in this patient with low functional capacity is very very high.  I think if she develops severe shortness of breath when transferring an aortic valve intervention could be considered but again I think the threshold is quite high.  I do not think her symptoms of fatigue that she is experiencing now are due to her aortic stenosis.  Her fatigue seems to be happening at rest and she tells me is not with exertion.  We will have her follow-up in 6 months or earlier if needed. Mitral regurgitation: Looks to be mild to moderate on my review her last echocardiogram; continue medical therapy. Tricuspid regurgitation: I do not have any echocardiograms prior to her pacemaker placement in 2020.  There was mild tricuspid regurgitation immediately after the pacemaker placement so I do not think her tricuspid regurgitation is related to her pacemaker placement.  This is likely due to continued right atrial remodeling due to persistent atrial fibrillation. Sick sinus syndrome: Status post dual-chamber pacemaker; followed by EP. Persistent atrial fibrillation: Continue Eliquis  2.5 mg twice daily, Toprol  25 mg daily, digoxin  0.125 mg daily Secondary hypercoagulable state: Continue low-dose Eliquis  2.5 mg twice daily Hyperlipidemia: By virtue of the presence of aortic atherosclerosis the patient's LDL goal is less than  70; given the patient's advanced age would defer intensive lipid-lowering. Aortic atherosclerosis: Continue Eliquis  2.5 mg twice daily instead of aspirin; defer statin.   I have personally reviewed the patients imaging data as summarized above.  I have reviewed the natural history of aortic stenosis with the patient and family members who are present today. We have discussed the limitations of medical therapy and the poor prognosis associated with symptomatic aortic stenosis. We have also reviewed potential treatment options, including palliative medical therapy, conventional surgical aortic valve replacement, and transcatheter aortic valve replacement. We discussed treatment options in the context of this patient's specific comorbid medical conditions.   All of the patient's questions were answered today. Will make further recommendations based on the results of studies outlined above.   I spent 45 minutes reviewing all clinical data during and prior to this visit including all relevant imaging studies, laboratories, clinical information from other health systems and prior notes from both Cardiology and other specialties, interviewing the patient, conducting a complete physical examination, and coordinating care in order to formulate a comprehensive and personalized evaluation and treatment plan.   Deklen Popelka K Charliegh Vasudevan, MD  04/30/2024 2:04 PM    [facility] [address] [place], [place], [place]  [address] Phone: [phone]; Fax: [phone]

## 2024-04-29 ENCOUNTER — Inpatient Hospital Stay: Attending: Oncology

## 2024-04-29 DIAGNOSIS — Z23 Encounter for immunization: Secondary | ICD-10-CM | POA: Diagnosis not present

## 2024-04-29 MED ORDER — INFLUENZA VAC SPLIT HIGH-DOSE 0.5 ML IM SUSY
0.5000 mL | PREFILLED_SYRINGE | Freq: Once | INTRAMUSCULAR | Status: AC
  Administered 2024-04-29: 0.5 mL via INTRAMUSCULAR
  Filled 2024-04-29: qty 0.5

## 2024-04-29 NOTE — Progress Notes (Signed)
 Remote pacemaker transmission.

## 2024-04-30 ENCOUNTER — Encounter: Payer: Self-pay | Admitting: Internal Medicine

## 2024-04-30 ENCOUNTER — Ambulatory Visit: Attending: Internal Medicine | Admitting: Internal Medicine

## 2024-04-30 VITALS — BP 160/82 | HR 65 | Ht 63.5 in | Wt 117.0 lb

## 2024-04-30 DIAGNOSIS — I495 Sick sinus syndrome: Secondary | ICD-10-CM | POA: Insufficient documentation

## 2024-04-30 DIAGNOSIS — E785 Hyperlipidemia, unspecified: Secondary | ICD-10-CM | POA: Insufficient documentation

## 2024-04-30 DIAGNOSIS — I340 Nonrheumatic mitral (valve) insufficiency: Secondary | ICD-10-CM | POA: Diagnosis present

## 2024-04-30 DIAGNOSIS — I350 Nonrheumatic aortic (valve) stenosis: Secondary | ICD-10-CM | POA: Insufficient documentation

## 2024-04-30 DIAGNOSIS — I4819 Other persistent atrial fibrillation: Secondary | ICD-10-CM | POA: Diagnosis present

## 2024-04-30 DIAGNOSIS — I361 Nonrheumatic tricuspid (valve) insufficiency: Secondary | ICD-10-CM | POA: Diagnosis present

## 2024-04-30 DIAGNOSIS — I700 Atherosclerosis of aorta: Secondary | ICD-10-CM | POA: Diagnosis present

## 2024-04-30 DIAGNOSIS — D6869 Other thrombophilia: Secondary | ICD-10-CM | POA: Diagnosis present

## 2024-04-30 NOTE — Patient Instructions (Signed)
 Medication Instructions:  No medication changes were made at this visit. Continue current regimen.   *If you need a refill on your cardiac medications before your next appointment, please call your pharmacy*  Lab Work: None ordered today. If you have labs (blood work) drawn today and your tests are completely normal, you will receive your results only by: MyChart Message (if you have MyChart) OR A paper copy in the mail If you have any lab test that is abnormal or we need to change your treatment, we will call you to review the results.  Testing/Procedures: None ordered today.  Follow-Up: At [facility], you and your health needs are our priority.  As part of our continuing mission to provide you with exceptional heart care, our providers are all part of one team.  This team includes your primary Cardiologist (physician) and Advanced Practice Providers or APPs (Physician Assistants and Nurse Practitioners) who all work together to provide you with the care you need, when you need it.  Your next appointment:   6 month(s)  Provider:   Izetta Hummer, PA-C

## 2024-05-06 ENCOUNTER — Ambulatory Visit (INDEPENDENT_AMBULATORY_CARE_PROVIDER_SITE_OTHER): Payer: Medicare Other

## 2024-05-06 DIAGNOSIS — I442 Atrioventricular block, complete: Secondary | ICD-10-CM

## 2024-05-06 NOTE — Progress Notes (Deleted)
 I saw Deanna Rivera in neurology clinic on 05/14/24 in follow up for inclusion body myositis.  HPI: Deanna Rivera is a 89 y.o. year old female with a history of HTN, CHF, afib s/p PM, hypothyroidism (Hashimoto's), polymyalgia rheumatica, and prior presumptive diagnosis of inclusion body myositis who we last saw on 11/13/23.  To briefly review: Initial consult (08/14/22): Patient had symptoms since at least 1970s when she was having weakness. She was told she had lupus. She had improvement of symptoms. She had weakness for many years at least prior to 2009. Per records: Patient has had symptoms for many years. She has previously been seen by neurology at [facility] (Dr. Nelida 02/05/2008), and [facility]. She had an EMG in 2009 that showed myopathic changes in multiple muscles and a muscle biopsy that showed inflammatory infiltrates per documentation from [facility] in 2019. 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 She may have also been diagnosed with polymyalgia rheumatica or polymyositis in the past as well, per patient. Of note, CK in 2019 was 404.   Patient is currently in wheelchair most of the time for about 3 months. Prior to this, patient was using a walker. When she using her walker, she would fall very often (more than 60 times). She was progressively getting weaker over the years. About 1 year ago she fell and broke a T9 vertebrae. Since this, she has particularly gotten worse. She was told to use a wheelchair to prevent falls. Patient continues to work with PT but is not improving. Patient was referred to NSGY by PCP ([facility]) who said the fracture had healed but that there was degenerative changes and things did not heal well. She is not a surgical candidate per patient.    Patient has had difficulty swallowing for years (liquids and solids). Of late, it has been worse. Patient was found to have a left neck mass. Patient was to have a swallow test and CT of neck,  but has not done either.   Patient denies significant shortness of breath, including when laying flat.   Per patient, she has had longstanding numbness in feet (called it neuropathy).  She can have some pins and needles at night (mild).   She also endorses poor sleep. She is not sure why she does not sleep well, but says her husband has to get up multiple times per night that wakes patient and she cannot fall back asleep.   Patient currently lives in independent living. She does not like the food and sometimes misses meals. She denies significant weight loss. She gets PT at her facility.   Family history of neuropathy/myopathy/NM disease? Daughter had scleroderma and died at age 61.   Patient has been on long term steroids for decades. She is currently on 1 mg of prednisone  daily.   02/13/23: Per my telephone note from 08/21/22: Called patient to discuss the results of her testing. Overall, her CK and ESR are similar to prior. Her CK has been higher in the past. I think her muscle disease is slowly progressive, as would be expected in IBM (what has been her presumed diagnosis in the past). Her ANA was positive (1:1280) with a negative ENA. Patient does not currently see rheumatology. I discussed that they would be helpful to make sure there was not something we were missing. Patient was in agreement with this. 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 She is agreeable to hematology referral for this.  Patient saw hematology who had a low suspicion for MM and felt IFE represented MGUS.   Patient was admitted from 11/03/22 to 11/07/22 for weakness in the setting of recent flu A infection. Per DC summary:  SLP evaluated and patient was taken for an MBS and she is noted to have mild to moderate primary pharyngeal dysphagia is likely chronic in nature and related to other degenerative changes and spurring in the cervical vertebra as well as generalized weakness and oral consent was largely  functional. She had a markedly reduced laryngeal closure tongue base retraction and epiglottic movement of pharyngeal esophageal segment opening. SLP feels that she has been intermittently aspirating for some time and overall weakness has been exacerbated her swallowing difficulty and SLP feels like her dysphagia will wax and wane and the adverse consequences of aspiration will recur at some point in the future.    Patient went to rehab after hospitalization. She feels like baseline in some regards, but now has more difficulty getting up. She has days that she feels strong and some days she feel weak.    Regarding her swallowing, she does endorse difficulty with swallowing. She is choking daily, but maybe not every meal. Patient got speech therapy as well.   05/22/23: Patient is doing similar to prior. She reports no new issues. She heard people about the power wheelchair, but never heard further after the initial call.   Patient is doing PT and OT. Speech therapy has not started working with her yet, but will. Patient's swallowing continues to get worse. She finds it hard to swallow, including water.    Patient saw rheumatology and had negative myositis work up.  11/13/23: Patient now has a power chair. She thinks it is very helpful. She does not need as much help from her husband.   Patient thinks her swallowing is about the same as prior. She thinks her left neck makes a noise that only she can hear. She cannot describe it further. She chokes frequently.   She thinks her weakness is similar to prior. She is doing PT/OT twice per week currently.   She recently had Noravirus and is recovering.  Most recent Assessment and Plan (11/13/23): This is Deanna Rivera, a 89 y.o. female with progressive weakness since at least the 55s. Her neurological examination is pertinent for mild dysarthria, generalized weakness both distally and proximally, diminished sensation in bilateral lower extremities,  hyporeflexia, and inability to ambulate. Previous extensive work up is summarized above but includes MRI showing fatty infiltration of vastus lateralis and semimembranosus muscles. EMG in 2009 showed normal sensory responses, reduced motor responses, and fibrillation potentials on needle examination. Muscle biopsy in 2009 showed active inflammation and muscle deinnervation. She was given the presumptive diagnosis of inclusion body myositis. Patient is currently wheelchair bound since late 2023.    Patient's symptoms have been present for decades and slowly progressive despite therapy. This is likely in keeping with previous diagnosis of inclusion body myositis (IBM). This may also be responsible for dysphagia as there has not been another etiology identified. The lack of sensation in lower extremities and neuropathy patient describes would be be expected in IBM though. 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 We previously discussed repeating an EMG to clarify, but patient has not interested in pursuing that.   Plan: -Continue PT and OT -Again discussed PEG. Patient is not interested currently. Discussed swallow precautions. -Fall precautions discussed  Since their last visit: ***  Weakness? Falls? Swallowing? PT/OT?  ROS:  Pertinent positive and negative systems reviewed in HPI. ***   MEDICATIONS:  Outpatient Encounter Medications as of 05/14/2024  Medication Sig   acetaminophen  (TYLENOL ) 500 MG tablet Take 500 mg by mouth every 6 (six) hours as needed for mild pain or headache.   digoxin  (LANOXIN ) 0.125 MG tablet TAKE 1 TABLET BY MOUTH DAILY   dorzolamide-timolol (COSOPT) 22.3-6.8 MG/ML ophthalmic solution Place 1 drop into both eyes 2 (two) times daily.   ELIQUIS  2.5 MG TABS tablet TAKE 1 TABLET BY MOUTH TWICE  DAILY   furosemide  (LASIX ) 20 MG tablet TAKE 1 TABLET BY MOUTH DAILY AS NEEDED   latanoprost (XALATAN) 0.005 % ophthalmic solution Place 1 drop into both eyes at bedtime.    levothyroxine  (SYNTHROID ) 125 MCG tablet Take 1 tablet (125 mcg total) by mouth daily.   metoprolol  succinate (TOPROL -XL) 25 MG 24 hr tablet Take 0.5 tablets (12.5 mg total) by mouth daily.   polyethylene glycol (MIRALAX  / GLYCOLAX ) 17 g packet Take 17 g by mouth daily as needed for severe constipation.   predniSONE  (DELTASONE ) 1 MG tablet Take 1 tablet (1 mg total) by mouth daily.   No facility-administered encounter medications on file as of 05/14/2024.    PAST MEDICAL HISTORY: Past Medical History:  Diagnosis Date   Cataract    Hypertension    Moderate aortic stenosis    Moderate mitral regurgitation    Persistent atrial fibrillation (HCC)    PMR (polymyalgia rheumatica)    Second degree AV block    Sick sinus syndrome (HCC)    Thyroid  disease     PAST SURGICAL HISTORY: Past Surgical History:  Procedure Laterality Date   ABDOMINAL HYSTERECTOMY     APPENDECTOMY     CESAREAN SECTION     x 3   CHOLECYSTECTOMY     PACEMAKER GENERATOR CHANGE  11/20/2018   [employer] Accolade MRI PPM implanted in [place]    PACEMAKER IMPLANT     PILONIDAL CYST EXCISION      ALLERGIES: Allergies  Allergen Reactions   Shellfish-Derived Products Shortness Of Breath and Other (See Comments)    Lips go numb   Epinephrine  Other (See Comments) and Hypertension    Makes me feel wired   Monosodium Glutamate Other (See Comments)    other   Monosodium Glutamate Hypertension and Other (See Comments)    Makes me feel badly  Other Reaction(s): Other (See Comments)  other, other   Ciprofloxacin Other (See Comments)    Allergic reaction not recalled  Other Reaction(s): Other (See Comments)  Other reaction(s): Intolerance   Dronedarone     Other Reaction(s): Other (See Comments)  Leg swelling, Leg swelling   Erythromycin Other (See Comments)    Allergic reaction not recalled   Sulfa Antibiotics Other (See Comments)    Allergic reaction not recalled    Sulfamethoxazole-Trimethoprim    Tetracyclines & Related Other (See Comments)    Allergic reaction not recalled    FAMILY HISTORY: Family History  Problem Relation Age of Onset   Early death Mother 48   Rheumatic fever Mother    Heart disease Mother        valve replacement   Cirrhosis Father 53   Hypertension Father    Hypertension Brother    Lung cancer Brother    Hypertension Brother    Other Brother        smoker   Scleroderma Daughter    Hypertension Son    Hypertension Son     SOCIAL HISTORY: Social  History   Tobacco Use   Smoking status: Former    Types: Cigarettes    Passive exposure: Past   Smokeless tobacco: Never  Vaping Use   Vaping status: Never Used  Substance Use Topics   Alcohol use: Not Currently   Drug use: Not Currently   Social History   Social History Narrative   ** Merged History Encounter **       Recently moved from [place] . In independent living with her husband. Daughter died from scleroderma complications. Hx of falls, but last was > 1.5 years ago. 01/18/2019     Objective:  Vital Signs:  There were no vitals taken for this visit.  General:*** General appearance: Awake and alert. No distress. Cooperative with exam.  Skin: No obvious rash or jaundice. HEENT: Atraumatic. Anicteric. Lungs: Non-labored breathing on room air  Heart: Regular Abdomen: Soft, non tender. Extremities: No edema. No obvious deformity.  Musculoskeletal: No obvious joint swelling.  Neurological: Mental Status: Alert. Speech fluent. No pseudobulbar affect Cranial Nerves: CNII: No RAPD. Visual fields intact. CNIII, IV, VI: PERRL. No nystagmus. EOMI. CN V: Facial sensation intact bilaterally to fine touch. [place] clench strong. Jaw jerk***. CN VII: Facial muscles symmetric and strong. No ptosis at rest or after sustained upgaze***. CN VIII: Hears finger rub well bilaterally. CN IX: No hypophonia. CN X: Palate elevates symmetrically. CN XI: Full  strength shoulder shrug bilaterally. CN XII: Tongue protrusion full and midline. No atrophy or fasciculations. No significant dysarthria*** Motor: Tone is ***. *** fasciculations in *** extremities. *** atrophy. No grip or percussive myotonia.  Individual muscle group testing (MRC grade out of 5):  Movement     Neck flexion ***    Neck extension ***     Right Left   Shoulder abduction *** ***   Shoulder adduction *** ***   Shoulder ext rotation *** ***   Shoulder int rotation *** ***   Elbow flexion *** ***   Elbow extension *** ***   Wrist extension *** ***   Wrist flexion *** ***   Finger abduction - FDI *** ***   Finger abduction - ADM *** ***   Finger extension *** ***   Finger distal flexion - 2/3 *** ***   Finger distal flexion - 4/5 *** ***   Thumb flexion - FPL *** ***   Thumb abduction - APB *** ***    Hip flexion *** ***   Hip extension *** ***   Hip adduction *** ***   Hip abduction *** ***   Knee extension *** ***   Knee flexion *** ***   Dorsiflexion *** ***   Plantarflexion *** ***   Inversion *** ***   Eversion *** ***   Great toe extension *** ***   Great toe flexion *** ***     Reflexes:  Right Left  Bicep *** ***  Tricep *** ***  BrRad *** ***  Knee *** ***  Ankle *** ***   Pathological Reflexes: Babinski: *** response bilaterally*** Hoffman: *** Troemner: *** Pectoral: *** Palmomental: *** Facial: *** Midline tap: *** Sensation: Pinprick: *** Vibration: *** Temperature: *** Proprioception: *** Coordination: Intact finger-to- nose-finger and heel-to-shin bilaterally. Romberg negative.*** Gait: Able to rise from chair with arms crossed unassisted. Normal, narrow-based gait. Able to tandem walk. Able to walk on toes and heels.***   Lab and Test Review: New results: 03/25/24: TSH: 7.66 CMP unremarkable Lipid panel: tChol 190, LDL 109, TG 72.0  Previously reviewed results: Chem (08/13/23): unremarkable  CBC w/ diff  (11/06/23): unremarkable CMP (11/06/23): unremarkable   02/28/23: C3/C4 wnl dsDNA wnl Anti-Sm wnl Scl-70 wnl RNP ab wnl SSA and SSB wnl   02/21/23: CK: 254 Aldolase wnl ESR: 79 ANA positive (1:320, nuclear, homogeneous) Vit D wnl HMGCoAR ab < 2 Myositis panel: Component     Latest Ref Rng 02/21/2023  JO-1 AB     <11 SI <11   PL-7 AB     <11 SI <11   PL-12 AB     <11 SI <11   EJ AB     <11 SI <11   OJ AB     <11 SI <11   SRP-AB     <11 SI <11   MI-2 ALPHA AB     <11 SI <11   MI-2 BETA AB     <11 SI <11   MDA-5 AB     <11 SI <11   TIF-1y AB     <11 SI <11   NXP-2 AB     <11 SI <11    08/14/22: IFE: IgG lambda monoclonal ab ANA positive: 1:1280, homogeneous dsDNA, RNP, Sm, Scl-70, SSA, SSB negative B1 wnl B12: 493 CK: 251 ESR: 73   Kappa/lambda ratio wnl   CK (10/29/22): 121 TSH (10/29/22): 0.501 ESR (11/04/22): 58 CK (11/04/22): 61  External labs: Per [facility] clinic note from 02/05/2008: CK: since 2002, levels in the 192 to 440 (440 in January 2009. December 03 2007 (5 days post emg) 268 ESR remains high since 2002 (44-82)  EMG elsewhere (april 17th 2009) Normal upper extremities sensory nerve responses, but for prolongation of median latency, low motor amplitudes ulnar and tibial, needle electrode examination shows fibrillations and positive sharp waves and small motor unit potentials in all muscles tested (proximal and paraspinals- no hand-forearm muscles).  US  neck (may 2009): multinodular goiter  MRI left leg (february 2009) Signal abn in vastus lateralis, fatty infiltration in vastus lat and semimembranosus Right leg: vastus changes  Muscle biopsy (april 1st 2009) Active inflammation with muscle fiber destruction and invasion by lymphocytes, mild type 2 atrophy, stains normal, no inclusion (no EM done)  Blood tests June 15th Cmp normal Ck 124 Crp N ESR 44 No monoclonal protein in blood and urine Hepatitis B serology- no exposure ANA, ENA, RF  negative TSH 0.27 PTH 72 (could indicate low vitamin D ) CBC N Urinalysis normal    Swallow evaluation (11/05/22): Clinical Impression: Clinical Impression: Deanna Rivera presents with a mild-moderate primary pharyngeal dysphagia that is likely chronic in nature and related to degenerative changes and spurring in cervical vertebrae as well as generalized weakness.  Oral phase was largely functional.  Pharyngeal phase was marked by reduced laryngeal closure, tongue base retraction, epiglottic movement, and pharyngoesophageal segment opening.  These impairments in the swallow were partially due to appearance of osteophytes which inhibited full inversion of epiglottis over larynx.  There was a prominent CP, leading to material collecting above.  Reduced propulsion through the pharynx/PES was also related to inferred weakness of the base of tongue/pharyngeal wall. There was consistent penetration of thin liquids, often to the level of the vocal folds. Penetration elicited a frequent cough response; there was minimal aspiration.  Solid food residue tended to remain in the valleculae and was difficult to transition.  A chin tuck was trialed, it may have offered marginal benefit in bolus transfer of solids and airway protection with thin liquids. Spent time after the study reviewing imaging in pt's room and discussing results  with pt/husband.  She should try tucking chin with all solids/liquids. Continue current diet (regular solids/thin liquids); Pt and husband will need a lot of reinforcement to recall results and precautions. SLP will follow while admitted.   Recommendations/Plan: Swallowing Evaluation Recommendations Swallowing Evaluation Recommendations Recommendations: PO diet PO Diet Recommendation: Regular; Thin liquids (Level 0) Liquid Administration via: Cup; Straw Medication Administration: Whole meds with puree Supervision: Patient able to self-feed Swallowing strategies  : Chin tuck Postural  changes: Position pt fully upright for meals   CT head (11/04/22): IMPRESSION: No acute intracranial abnormality noted.   Chronic ischemic changes are noted.   Echo (10/16/22): 1. Left ventricular ejection fraction, by estimation, is 65 to 70%. The  left ventricle has normal function. The left ventricle has no regional  wall motion abnormalities. Left ventricular diastolic function could not  be evaluated.   2. Right ventricular systolic function is normal. The right ventricular  size is normal. There is severely elevated pulmonary artery systolic  pressure. The estimated right ventricular systolic pressure is 62.1 mmHg.   3. Left atrial size was severely dilated.   4. Right atrial size was severely dilated.   5. The mitral valve is normal in structure. Mild to moderate mitral valve  regurgitation.   6. Tricuspid valve regurgitation is moderate to severe.   7. The aortic valve is tricuspid. There is mild calcification of the  aortic valve. There is moderate thickening of the aortic valve. Aortic  valve regurgitation is trivial. Moderate aortic valve stenosis.   8. The inferior vena cava is dilated in size with <50% respiratory  variability, suggesting right atrial pressure of 15 mmHg.    CT thoracic spine wo contrast (07/11/22): IMPRESSION: Chronic T8 compression fracture with progressive anterior height loss, measuring up to 60%, previously 20% on CT June 2022, with exaggerated thoracic kyphosis centered at T8. Unchanged 3 mm inferior endplate retropulsion with resultant mild spinal canal stenosis at this level.   No new compression fracture.   Mild multilevel degenerative changes of the thoracic spine.   3 mm pulmonary nodule in the left upper lobe. No follow-up needed if patient is low-risk. A 12 month follow-up chest CT can be considered if the patient is high-risk.This recommendation follows the consensus statement: Guidelines for Management of Incidental Pulmonary  Nodules Detected on CT Images: From the Fleischner Society 2017; Radiology 2017; 284:228-243.   CT cervical spine wo contrast (08/09/21): IMPRESSION: 1. Slight anterior subluxations at C2-3 and C3-4, similar to prior study, likely degenerative. 2. No acute displaced fractures identified. 3. Diffuse degenerative changes.   CT head wo contrast (09/04/21): IMPRESSION: Atrophy, chronic microvascular disease.   No acute intracranial abnormality.  ASSESSMENT: This is Deanna Rivera, a 89 y.o. female with:  ***  Plan: ***  Return to clinic in ***  Total time spent reviewing records, interview, history/exam, documentation, and coordination of care on day of encounter:  *** min  Venetia Potters, MD

## 2024-05-07 ENCOUNTER — Other Ambulatory Visit (HOSPITAL_BASED_OUTPATIENT_CLINIC_OR_DEPARTMENT_OTHER): Payer: Self-pay

## 2024-05-07 LAB — CUP PACEART REMOTE DEVICE CHECK
Battery Remaining Longevity: 126 mo
Battery Remaining Percentage: 100 %
Brady Statistic RA Percent Paced: 0 %
Brady Statistic RV Percent Paced: 61 %
Date Time Interrogation Session: 20250925064200
Implantable Lead Connection Status: 753985
Implantable Lead Connection Status: 753985
Implantable Lead Implant Date: 20130305
Implantable Lead Implant Date: 20130305
Implantable Lead Location: 753859
Implantable Lead Location: 753860
Implantable Lead Model: 4469
Implantable Lead Model: 4470
Implantable Lead Serial Number: 546229
Implantable Lead Serial Number: 712016
Implantable Pulse Generator Implant Date: 20200409
Lead Channel Impedance Value: 419 Ohm
Lead Channel Pacing Threshold Amplitude: 0.5 V
Lead Channel Pacing Threshold Pulse Width: 0.4 ms
Lead Channel Setting Pacing Amplitude: 1 V
Lead Channel Setting Pacing Pulse Width: 0.4 ms
Lead Channel Setting Sensing Sensitivity: 0.6 mV
Pulse Gen Serial Number: 859007
Zone Setting Status: 755011

## 2024-05-07 MED ORDER — COMIRNATY 30 MCG/0.3ML IM SUSY
0.3000 mL | PREFILLED_SYRINGE | Freq: Once | INTRAMUSCULAR | 0 refills | Status: AC
  Filled 2024-05-07: qty 0.3, 1d supply, fill #0

## 2024-05-10 ENCOUNTER — Ambulatory Visit: Payer: Self-pay | Admitting: Internal Medicine

## 2024-05-11 NOTE — Progress Notes (Signed)
 Remote PPM Transmission

## 2024-05-14 ENCOUNTER — Ambulatory Visit: Admitting: Neurology

## 2024-05-15 ENCOUNTER — Other Ambulatory Visit: Payer: Self-pay | Admitting: Family Medicine

## 2024-05-15 ENCOUNTER — Ambulatory Visit: Payer: Self-pay

## 2024-05-15 NOTE — Telephone Encounter (Signed)
 FYI Only or Action Required?: FYI only for provider.  Patient was last seen in primary care on 03/25/2024 by Jodie Lavern CROME, MD.  Called Nurse Triage reporting Urinary Frequency.  Symptoms began x 10 days ago.  Interventions attempted: Nothing.  Symptoms are: unchanged.  Triage Disposition: See Physician Within 24 Hours  Patient/caregiver understands and will follow disposition?: Yes  **See note below**       Copied from CRM #[id number]. Topic: Clinical - Red Word Triage >> May 15, 2024 12:45 PM Viola F wrote: Red Word that prompted transfer to Nurse Triage: Patient having burning urination and odor - wants to know what she should do Reason for Disposition  Urinating more frequently than usual (i.e., frequency) OR new-onset of the feeling of an urgent need to urinate (i.e., urgency)  Answer Assessment - Initial Assessment Questions 1. SYMPTOM: What's the main symptom you're concerned about? (e.g., frequency, incontinence)     Burning urination and odor, frequency   2. ONSET: When did the  symptoms  start?     X 10 days   3. PAIN: Is there any pain? If Yes, ask: How bad is it? (Scale: 1-10; mild, moderate, severe)     No   4. CAUSE: What do you think is causing the symptoms?     Possible UTI   5. OTHER SYMPTOMS: Do you have any other symptoms? (e.g., blood in urine, fever, flank pain, pain with urination) No  Patient stated care giver will drop off urine sample on Monday 10/6; she declined an appointment. Patient stated this has been done before; can the office follow-up?  Protocols used: Urinary Symptoms-A-AH

## 2024-05-18 NOTE — Telephone Encounter (Signed)
 Noted

## 2024-05-18 NOTE — Telephone Encounter (Signed)
 Called pt to offer ov but pt declined stating she is unable to come in any time this week. Advised pt to not wait too much longer as symptoms will continue or worsen. P states she will call us  back

## 2024-05-28 ENCOUNTER — Encounter: Payer: Self-pay | Admitting: Family Medicine

## 2024-05-28 ENCOUNTER — Ambulatory Visit: Payer: Self-pay | Admitting: Family Medicine

## 2024-05-28 ENCOUNTER — Ambulatory Visit: Admitting: Family Medicine

## 2024-05-28 VITALS — BP 120/70 | HR 61 | Temp 98.1°F | Ht 63.5 in | Wt 115.0 lb

## 2024-05-28 DIAGNOSIS — I350 Nonrheumatic aortic (valve) stenosis: Secondary | ICD-10-CM | POA: Diagnosis not present

## 2024-05-28 DIAGNOSIS — R350 Frequency of micturition: Secondary | ICD-10-CM | POA: Diagnosis not present

## 2024-05-28 DIAGNOSIS — E063 Autoimmune thyroiditis: Secondary | ICD-10-CM

## 2024-05-28 DIAGNOSIS — L723 Sebaceous cyst: Secondary | ICD-10-CM | POA: Diagnosis not present

## 2024-05-28 LAB — T3, FREE: T3, Free: 2.2 pg/mL — ABNORMAL LOW (ref 2.3–4.2)

## 2024-05-28 LAB — TSH: TSH: 3.25 u[IU]/mL (ref 0.35–5.50)

## 2024-05-28 LAB — T4, FREE: Free T4: 1.51 ng/dL (ref 0.60–1.60)

## 2024-05-28 NOTE — Progress Notes (Signed)
 Subjective  CC:  Chief Complaint  Patient presents with   Cyst    Pt stated that she has a cyst at the base of her neck and it is red.    HPI: Deanna Rivera is a 89 y.o. female who presents to the office today to address the problems listed above in the chief complaint. Discussed the use of AI scribe software for clinical note transcription with the patient, who gave verbal consent to proceed.  History of Present Illness Deanna Rivera is an 89 year old female who presents with a recurrent sebaceous cyst on her back.  Cutaneous mass (sebaceous cyst) - Recurrent sebaceous cyst located on the back, initially identified many many years ago - Previously offered removal, but not fully excised; recurrence noted - Cyst has been present for many years without significant discomfort until recently - Recent increase in size and erythema over the past week - Caregiver observed the cyst appearing 'mad' and 'infected' - No significant pain associated with the cyst  Thyroid  dysfunction - History of thyroid  issues - Currently taking medication for hyperthyroidism - due for recheck since adjusting dose upward Lab Results  Component Value Date   TSH 7.66 (H) 03/25/2024    Urinary symptoms and polydipsia - Increased urinary frequency - Previous dysuria, now resolved - Persistent thirst - Nocturnal awakening to drink water - Concern regarding electrolyte balance   Assessment  1. Hypothyroidism due to Hashimoto's thyroiditis   2. Sebaceous cyst   3. Non-rheumatic aortic stenosis   4. Urinary frequency      Plan  Assessment and Plan Assessment & Plan Urinary frequency, possible urinary tract infection Increased urinary frequency with previous burning sensation, now resolved. Possible urinary tract infection considered. - Perform urinaly culture to evaluate for urinary tract infection.  hypothyroidism Thyroid  disorder under treatment. Regular monitoring required to ensure  appropriate management. - Order thyroid  function tests to assess current status.  Aortic valve disorder, severe - reviewed cardiology notes. Sedentary and wheel chair bound so asymptomatic. Severe aortic valve disorder, previously evaluated. No current intervention planned due to her overall health status and risk assessment. She understands that intervention is unlikely in the future.  Sebaceous cyst of back Sebaceous cyst on the back, present for years, currently not infected. Redness due to stretching of capillaries as the cyst grows. No tenderness or significant pain reported. She prefers to avoid intervention unless necessary due to past discomfort with excision. - Monitor the cyst for signs of infection or increased discomfort. Offered excision but pt defers.  - discussed risks of infection are possible.  - Consider referral to a dermatologist for excision if desired.  Follow up: prn Orders Placed This Encounter  Procedures   Urine Culture   TSH   T4, free   T3, free   No orders of the defined types were placed in this encounter.    I reviewed the patients updated PMH, FH, and SocHx.  Patient Active Problem List   Diagnosis Date Noted   Non-rheumatic aortic stenosis 05/28/2024    Priority: High   Bilateral lower extremity edema 01/15/2023    Priority: High   Pulmonary hypertension, unspecified (HCC) 01/15/2023    Priority: High   Age-related osteoporosis with current pathological fracture 02/06/2021    Priority: High   SSS (sick sinus syndrome) (HCC) 02/11/2019    Priority: High   Status post placement of cardiac pacemaker 07/08/2018    Priority: High   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  Priority: High   Nontoxic multinodular goiter 09/08/2015    Priority: High   Current use of long term anticoagulation 05/16/2015    Priority: High   (HFpEF) heart failure with preserved ejection fraction (HCC) 09/13/2014    Priority: High   Pacemaker 11/09/2011     Priority: High   Impaired fasting glucose 05/23/2009    Priority: High   Long term current use of systemic steroids 03/30/2008    Priority: High   Polymyalgia rheumatica 03/30/2008    Priority: High   Benign essential hypertension 05/05/2007    Priority: High   Persistent atrial fibrillation (HCC) 11/06/2006    Priority: High   Pure hypercholesterolemia 11/06/2006    Priority: High   Hypothyroidism due to Hashimoto's thyroiditis, Rx Levothyroxine  06/25/2005    Priority: High   Degenerative lumbar spinal stenosis 05/30/2022    Priority: Medium    Compression fracture of body of thoracic vertebra (HCC) 02/06/2021    Priority: Medium    Primary osteoarthritis of right knee 10/23/2019    Priority: Medium    Nonrheumatic mitral valve regurgitation 09/16/2015    Priority: Medium    Recurrent UTI 03/14/2015    Priority: Medium    Sensorineural hearing loss (SNHL) of both ears 01/14/2022    Priority: Low   Neuralgia, postherpetic 07/02/2019    Priority: Low   OAB (overactive bladder) 07/02/2019    Priority: Low   Constipation 01/18/2019    Priority: Low   Depression, major, single episode, moderate (HCC) 03/25/2024   Immunosuppression due to chronic steroid use 03/25/2024   Protein-calorie malnutrition, severe 11/06/2022   Frequent falls 05/17/2021   Current Meds  Medication Sig   acetaminophen  (TYLENOL ) 500 MG tablet Take 500 mg by mouth every 6 (six) hours as needed for mild pain or headache.   digoxin  (LANOXIN ) 0.125 MG tablet TAKE 1 TABLET BY MOUTH DAILY   dorzolamide-timolol (COSOPT) 22.3-6.8 MG/ML ophthalmic solution Place 1 drop into both eyes 2 (two) times daily.   ELIQUIS  2.5 MG TABS tablet TAKE 1 TABLET BY MOUTH TWICE  DAILY   furosemide  (LASIX ) 20 MG tablet TAKE 1 TABLET BY MOUTH DAILY AS NEEDED   latanoprost (XALATAN) 0.005 % ophthalmic solution Place 1 drop into both eyes at bedtime.   levothyroxine  (SYNTHROID ) 125 MCG tablet Take 1 tablet (125 mcg total) by mouth  daily.   metoprolol  succinate (TOPROL -XL) 25 MG 24 hr tablet Take 0.5 tablets (12.5 mg total) by mouth daily.   polyethylene glycol (MIRALAX  / GLYCOLAX ) 17 g packet Take 17 g by mouth daily as needed for severe constipation.   predniSONE  (DELTASONE ) 1 MG tablet Take 1 tablet (1 mg total) by mouth daily.   Allergies: Patient is allergic to shellfish protein-containing drug products, epinephrine , monosodium glutamate, monosodium glutamate, ciprofloxacin, dronedarone, erythromycin, sulfa antibiotics, sulfamethoxazole-trimethoprim, and tetracyclines & related. Family History: Patient family history includes Cirrhosis (age of onset: 32) in her father; Early death (age of onset: 77) in her mother; Heart disease in her mother; Hypertension in her brother, brother, father, son, and son; Lung cancer in her brother; Other in her brother; Rheumatic fever in her mother; Scleroderma in her daughter. Social History:  Patient  reports that she has quit smoking. Her smoking use included cigarettes. She has been exposed to tobacco smoke. She has never used smokeless tobacco. She reports that she does not currently use alcohol. She reports that she does not currently use drugs.  Review of Systems: Constitutional: Negative for fever malaise or anorexia Cardiovascular: negative  for chest pain Respiratory: negative for SOB or persistent cough Gastrointestinal: negative for abdominal pain  Objective  Vitals: BP 120/70   Pulse 61   Temp 98.1 F (36.7 C)   Ht 5' 3.5 (1.613 m)   Wt 115 lb (52.2 kg) Comment: pt reported  SpO2 97%   BMI 20.05 kg/m  General: no acute distress , A&Ox3 HEENT: PEERL, conjunctiva normal, neck is supple Cardiovascular:  RRR without murmur or gallop.  Respiratory:  Good breath sounds bilaterally, CTAB with normal respiratory effort Skin:  Warm, marble size non tender sebaceous cyst in mid upper back. No fluctuance or warmth Commons side effects, risks, benefits, and alternatives for  medications and treatment plan prescribed today were discussed, and the patient expressed understanding of the given instructions. Patient is instructed to call or message via MyChart if he/she has any questions or concerns regarding our treatment plan. No barriers to understanding were identified. We discussed Red Flag symptoms and signs in detail. Patient expressed understanding regarding what to do in case of urgent or emergency type symptoms.  Medication list was reconciled, printed and provided to the patient in AVS. Patient instructions and summary information was reviewed with the patient as documented in the AVS. This note was prepared with assistance of Dragon voice recognition software. Occasional wrong-word or sound-a-like substitutions may have occurred due to the inherent limitations of voice recognition software

## 2024-05-28 NOTE — Progress Notes (Signed)
 Please call patient:her thyroid  tests are improved. Please continue taking levothyroxine  125mcg daily and refill for her if needed. And did she leave a urine sample?

## 2024-05-28 NOTE — Patient Instructions (Signed)
 Please follow up if symptoms do not improve or as needed.     VISIT SUMMARY: Today, we discussed several health concerns including a recurrent sebaceous cyst on your back, thyroid  dysfunction, hypertension, and urinary symptoms. We reviewed your current symptoms and planned the necessary tests and follow-ups to manage your conditions effectively.  YOUR PLAN: -URINARY FREQUENCY, POSSIBLE URINARY TRACT INFECTION: You have been experiencing increased urinary frequency, which may indicate a urinary tract infection. We will perform a urinalysis to check for any infection.  -ELECTROLYTE IMBALANCE: Persistent thirst and waking up at night to drink water may suggest an imbalance in your body's electrolytes. We will order an electrolyte panel to assess your current status.  -THYROID  DISORDER: You have a history of thyroid  issues and are currently on medication for hyperthyroidism. We will order thyroid  function tests to ensure your thyroid  levels are well-managed.  -AORTIC VALVE DISORDER, SEVERE: You have a severe aortic valve disorder that has been previously evaluated. Given your overall health, no intervention is planned at this time, and it is unlikely that intervention will be needed in the future.  -SEBACEOUS CYST OF BACK: You have a recurrent sebaceous cyst on your back that has recently increased in size and redness. It is not currently infected. We will monitor the cyst for any signs of infection or increased discomfort. If you prefer, we can refer you to a dermatologist for possible removal.  INSTRUCTIONS: Please complete the urinalysis and electrolyte panel as soon as possible. We will also need to perform thyroid  function tests to monitor your thyroid  levels. Follow up with us  after completing these tests to discuss the results and next steps.                      Contains text generated by [employer].                                 Contains  text generated by [employer].

## 2024-06-02 ENCOUNTER — Ambulatory Visit (HOSPITAL_BASED_OUTPATIENT_CLINIC_OR_DEPARTMENT_OTHER): Admitting: Cardiology

## 2024-06-04 ENCOUNTER — Encounter: Payer: Self-pay | Admitting: Family Medicine

## 2024-06-04 NOTE — Progress Notes (Deleted)
 I saw Deanna Rivera in neurology clinic on 05/14/24 in follow up for inclusion body myositis.  HPI: Deanna Rivera is a 89 y.o. year old female with a history of HTN, CHF, afib s/p PM, hypothyroidism (Hashimoto's), polymyalgia rheumatica, and prior presumptive diagnosis of inclusion body myositis who we last saw on 11/13/23.  To briefly review: Initial consult (08/14/22): Patient had symptoms since at least 1970s when she was having weakness. She was told she had lupus. She had improvement of symptoms. She had weakness for many years at least prior to 2009. Per records: Patient has had symptoms for many years. She has previously been seen by neurology at [facility] (Dr. Nelida 02/05/2008), and [facility]. She had an EMG in 2009 that showed myopathic changes in multiple muscles and a muscle biopsy that showed inflammatory infiltrates per documentation from [facility] in 2019. 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 She may have also been diagnosed with polymyalgia rheumatica or polymyositis in the past as well, per patient. Of note, CK in 2019 was 404.   Patient is currently in wheelchair most of the time for about 3 months. Prior to this, patient was using a walker. When she using her walker, she would fall very often (more than 60 times). She was progressively getting weaker over the years. About 1 year ago she fell and broke a T9 vertebrae. Since this, she has particularly gotten worse. She was told to use a wheelchair to prevent falls. Patient continues to work with PT but is not improving. Patient was referred to NSGY by PCP ([facility]) who said the fracture had healed but that there was degenerative changes and things did not heal well. She is not a surgical candidate per patient.    Patient has had difficulty swallowing for years (liquids and solids). Of late, it has been worse. Patient was found to have a left neck mass. Patient was to have a swallow test and CT of neck,  but has not done either.   Patient denies significant shortness of breath, including when laying flat.   Per patient, she has had longstanding numbness in feet (called it neuropathy).  She can have some pins and needles at night (mild).   She also endorses poor sleep. She is not sure why she does not sleep well, but says her husband has to get up multiple times per night that wakes patient and she cannot fall back asleep.   Patient currently lives in independent living. She does not like the food and sometimes misses meals. She denies significant weight loss. She gets PT at her facility.   Family history of neuropathy/myopathy/NM disease? Daughter had scleroderma and died at age 20.   Patient has been on long term steroids for decades. She is currently on 1 mg of prednisone  daily.   02/13/23: Per my telephone note from 08/21/22: Called patient to discuss the results of her testing. Overall, her CK and ESR are similar to prior. Her CK has been higher in the past. I think her muscle disease is slowly progressive, as would be expected in IBM (what has been her presumed diagnosis in the past). Her ANA was positive (1:1280) with a negative ENA. Patient does not currently see rheumatology. I discussed that they would be helpful to make sure there was not something we were missing. Patient was in agreement with this. 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 She is agreeable to hematology referral for this.  Patient saw hematology who had a low suspicion for MM and felt IFE represented MGUS.   Patient was admitted from 11/03/22 to 11/07/22 for weakness in the setting of recent flu A infection. Per DC summary:  SLP evaluated and patient was taken for an MBS and she is noted to have mild to moderate primary pharyngeal dysphagia is likely chronic in nature and related to other degenerative changes and spurring in the cervical vertebra as well as generalized weakness and oral consent was largely  functional. She had a markedly reduced laryngeal closure tongue base retraction and epiglottic movement of pharyngeal esophageal segment opening. SLP feels that she has been intermittently aspirating for some time and overall weakness has been exacerbated her swallowing difficulty and SLP feels like her dysphagia will wax and wane and the adverse consequences of aspiration will recur at some point in the future.    Patient went to rehab after hospitalization. She feels like baseline in some regards, but now has more difficulty getting up. She has days that she feels strong and some days she feel weak.    Regarding her swallowing, she does endorse difficulty with swallowing. She is choking daily, but maybe not every meal. Patient got speech therapy as well.   05/22/23: Patient is doing similar to prior. She reports no new issues. She heard people about the power wheelchair, but never heard further after the initial call.   Patient is doing PT and OT. Speech therapy has not started working with her yet, but will. Patient's swallowing continues to get worse. She finds it hard to swallow, including water.    Patient saw rheumatology and had negative myositis work up.  11/13/23: Patient now has a power chair. She thinks it is very helpful. She does not need as much help from her husband.   Patient thinks her swallowing is about the same as prior. She thinks her left neck makes a noise that only she can hear. She cannot describe it further. She chokes frequently.   She thinks her weakness is similar to prior. She is doing PT/OT twice per week currently.   She recently had Noravirus and is recovering.  Most recent Assessment and Plan (11/13/23): This is Deanna Rivera, a 89 y.o. female with progressive weakness since at least the 27s. Her neurological examination is pertinent for mild dysarthria, generalized weakness both distally and proximally, diminished sensation in bilateral lower extremities,  hyporeflexia, and inability to ambulate. Previous extensive work up is summarized above but includes MRI showing fatty infiltration of vastus lateralis and semimembranosus muscles. EMG in 2009 showed normal sensory responses, reduced motor responses, and fibrillation potentials on needle examination. Muscle biopsy in 2009 showed active inflammation and muscle deinnervation. She was given the presumptive diagnosis of inclusion body myositis. Patient is currently wheelchair bound since late 2023.    Patient's symptoms have been present for decades and slowly progressive despite therapy. This is likely in keeping with previous diagnosis of inclusion body myositis (IBM). This may also be responsible for dysphagia as there has not been another etiology identified. The lack of sensation in lower extremities and neuropathy patient describes would be be expected in IBM though. 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 We previously discussed repeating an EMG to clarify, but patient has not interested in pursuing that.   Plan: -Continue PT and OT -Again discussed PEG. Patient is not interested currently. Discussed swallow precautions. -Fall precautions discussed  Since their last visit: ***  Weakness? Falls? Swallowing? PT/OT?  ROS:  Pertinent positive and negative systems reviewed in HPI. ***   MEDICATIONS:  Outpatient Encounter Medications as of 06/12/2024  Medication Sig   acetaminophen  (TYLENOL ) 500 MG tablet Take 500 mg by mouth every 6 (six) hours as needed for mild pain or headache.   digoxin  (LANOXIN ) 0.125 MG tablet TAKE 1 TABLET BY MOUTH DAILY   dorzolamide-timolol (COSOPT) 22.3-6.8 MG/ML ophthalmic solution Place 1 drop into both eyes 2 (two) times daily.   ELIQUIS  2.5 MG TABS tablet TAKE 1 TABLET BY MOUTH TWICE  DAILY   furosemide  (LASIX ) 20 MG tablet TAKE 1 TABLET BY MOUTH DAILY AS NEEDED   latanoprost (XALATAN) 0.005 % ophthalmic solution Place 1 drop into both eyes at bedtime.    levothyroxine  (SYNTHROID ) 125 MCG tablet Take 1 tablet (125 mcg total) by mouth daily.   metoprolol  succinate (TOPROL -XL) 25 MG 24 hr tablet Take 0.5 tablets (12.5 mg total) by mouth daily.   polyethylene glycol (MIRALAX  / GLYCOLAX ) 17 g packet Take 17 g by mouth daily as needed for severe constipation.   predniSONE  (DELTASONE ) 1 MG tablet Take 1 tablet (1 mg total) by mouth daily.   No facility-administered encounter medications on file as of 06/12/2024.    PAST MEDICAL HISTORY: Past Medical History:  Diagnosis Date   Cataract    Hypertension    Moderate aortic stenosis    Moderate mitral regurgitation    Persistent atrial fibrillation (HCC)    PMR (polymyalgia rheumatica)    Second degree AV block    Sick sinus syndrome (HCC)    Thyroid  disease     PAST SURGICAL HISTORY: Past Surgical History:  Procedure Laterality Date   ABDOMINAL HYSTERECTOMY     APPENDECTOMY     CESAREAN SECTION     x 3   CHOLECYSTECTOMY     PACEMAKER GENERATOR CHANGE  11/20/2018   [employer] Accolade MRI PPM implanted in [place]    PACEMAKER IMPLANT     PILONIDAL CYST EXCISION      ALLERGIES: Allergies  Allergen Reactions   Shellfish Protein-Containing Drug Products Shortness Of Breath and Other (See Comments)    Lips go numb   Epinephrine  Other (See Comments) and Hypertension    Makes me feel wired   Monosodium Glutamate Other (See Comments)    other   Monosodium Glutamate Hypertension and Other (See Comments)    Makes me feel badly  Other Reaction(s): Other (See Comments)  other, other   Ciprofloxacin Other (See Comments)    Allergic reaction not recalled  Other Reaction(s): Other (See Comments)  Other reaction(s): Intolerance   Dronedarone     Other Reaction(s): Other (See Comments)  Leg swelling, Leg swelling   Erythromycin Other (See Comments)    Allergic reaction not recalled   Sulfa Antibiotics Other (See Comments)    Allergic reaction not recalled    Sulfamethoxazole-Trimethoprim    Tetracyclines & Related Other (See Comments)    Allergic reaction not recalled    FAMILY HISTORY: Family History  Problem Relation Age of Onset   Early death Mother 44   Rheumatic fever Mother    Heart disease Mother        valve replacement   Cirrhosis Father 2   Hypertension Father    Hypertension Brother    Lung cancer Brother    Hypertension Brother    Other Brother        smoker   Scleroderma Daughter    Hypertension Son    Hypertension Son     SOCIAL  HISTORY: Social History   Tobacco Use   Smoking status: Former    Types: Cigarettes    Passive exposure: Past   Smokeless tobacco: Never  Vaping Use   Vaping status: Never Used  Substance Use Topics   Alcohol use: Not Currently   Drug use: Not Currently   Social History   Social History Narrative   ** Merged History Encounter **       Recently moved from [place] . In independent living with her husband. Daughter died from scleroderma complications. Hx of falls, but last was > 1.5 years ago. 01/18/2019     Objective:  Vital Signs:  There were no vitals taken for this visit.  General:*** General appearance: Awake and alert. No distress. Cooperative with exam.  Skin: No obvious rash or jaundice. HEENT: Atraumatic. Anicteric. Lungs: Non-labored breathing on room air  Heart: Regular Abdomen: Soft, non tender. Extremities: No edema. No obvious deformity.  Musculoskeletal: No obvious joint swelling.  Neurological: Mental Status: Alert. Speech fluent. No pseudobulbar affect Cranial Nerves: CNII: No RAPD. Visual fields intact. CNIII, IV, VI: PERRL. No nystagmus. EOMI. CN V: Facial sensation intact bilaterally to fine touch. [place] clench strong. Jaw jerk***. CN VII: Facial muscles symmetric and strong. No ptosis at rest or after sustained upgaze***. CN VIII: Hears finger rub well bilaterally. CN IX: No hypophonia. CN X: Palate elevates symmetrically. CN XI: Full  strength shoulder shrug bilaterally. CN XII: Tongue protrusion full and midline. No atrophy or fasciculations. No significant dysarthria*** Motor: Tone is ***. *** fasciculations in *** extremities. *** atrophy. No grip or percussive myotonia.  Individual muscle group testing (MRC grade out of 5):  Movement     Neck flexion ***    Neck extension ***     Right Left   Shoulder abduction *** ***   Shoulder adduction *** ***   Shoulder ext rotation *** ***   Shoulder int rotation *** ***   Elbow flexion *** ***   Elbow extension *** ***   Wrist extension *** ***   Wrist flexion *** ***   Finger abduction - FDI *** ***   Finger abduction - ADM *** ***   Finger extension *** ***   Finger distal flexion - 2/3 *** ***   Finger distal flexion - 4/5 *** ***   Thumb flexion - FPL *** ***   Thumb abduction - APB *** ***    Hip flexion *** ***   Hip extension *** ***   Hip adduction *** ***   Hip abduction *** ***   Knee extension *** ***   Knee flexion *** ***   Dorsiflexion *** ***   Plantarflexion *** ***   Inversion *** ***   Eversion *** ***   Great toe extension *** ***   Great toe flexion *** ***     Reflexes:  Right Left  Bicep *** ***  Tricep *** ***  BrRad *** ***  Knee *** ***  Ankle *** ***   Pathological Reflexes: Babinski: *** response bilaterally*** Hoffman: *** Troemner: *** Pectoral: *** Palmomental: *** Facial: *** Midline tap: *** Sensation: Pinprick: *** Vibration: *** Temperature: *** Proprioception: *** Coordination: Intact finger-to- nose-finger and heel-to-shin bilaterally. Romberg negative.*** Gait: Able to rise from chair with arms crossed unassisted. Normal, narrow-based gait. Able to tandem walk. Able to walk on toes and heels.***   Lab and Test Review: New results: 03/25/24: TSH: 7.66 CMP unremarkable Lipid panel: tChol 190, LDL 109, TG 72.0  TSH (05/28/24): wnl  Previously reviewed  results: Chem (08/13/23): unremarkable    CBC w/ diff (11/06/23): unremarkable CMP (11/06/23): unremarkable   02/28/23: C3/C4 wnl dsDNA wnl Anti-Sm wnl Scl-70 wnl RNP ab wnl SSA and SSB wnl   02/21/23: CK: 254 Aldolase wnl ESR: 79 ANA positive (1:320, nuclear, homogeneous) Vit D wnl HMGCoAR ab < 2 Myositis panel: Component     Latest Ref Rng 02/21/2023  JO-1 AB     <11 SI <11   PL-7 AB     <11 SI <11   PL-12 AB     <11 SI <11   EJ AB     <11 SI <11   OJ AB     <11 SI <11   SRP-AB     <11 SI <11   MI-2 ALPHA AB     <11 SI <11   MI-2 BETA AB     <11 SI <11   MDA-5 AB     <11 SI <11   TIF-1y AB     <11 SI <11   NXP-2 AB     <11 SI <11    08/14/22: IFE: IgG lambda monoclonal ab ANA positive: 1:1280, homogeneous dsDNA, RNP, Sm, Scl-70, SSA, SSB negative B1 wnl B12: 493 CK: 251 ESR: 73   Kappa/lambda ratio wnl   CK (10/29/22): 121 TSH (10/29/22): 0.501 ESR (11/04/22): 58 CK (11/04/22): 61  External labs: Per [facility] clinic note from 02/05/2008: CK: since 2002, levels in the 192 to 440 (440 in January 2009. December 03 2007 (5 days post emg) 268 ESR remains high since 2002 (44-82)  EMG elsewhere (april 17th 2009) Normal upper extremities sensory nerve responses, but for prolongation of median latency, low motor amplitudes ulnar and tibial, needle electrode examination shows fibrillations and positive sharp waves and small motor unit potentials in all muscles tested (proximal and paraspinals- no hand-forearm muscles).  US  neck (may 2009): multinodular goiter  MRI left leg (february 2009) Signal abn in vastus lateralis, fatty infiltration in vastus lat and semimembranosus Right leg: vastus changes  Muscle biopsy (april 1st 2009) Active inflammation with muscle fiber destruction and invasion by lymphocytes, mild type 2 atrophy, stains normal, no inclusion (no EM done)  Blood tests June 15th Cmp normal Ck 124 Crp N ESR 44 No monoclonal protein in blood and urine Hepatitis B serology- no exposure ANA, ENA,  RF negative TSH 0.27 PTH 72 (could indicate low vitamin D ) CBC N Urinalysis normal    Swallow evaluation (11/05/22): Clinical Impression: Clinical Impression: Ms. Tupper presents with a mild-moderate primary pharyngeal dysphagia that is likely chronic in nature and related to degenerative changes and spurring in cervical vertebrae as well as generalized weakness.  Oral phase was largely functional.  Pharyngeal phase was marked by reduced laryngeal closure, tongue base retraction, epiglottic movement, and pharyngoesophageal segment opening.  These impairments in the swallow were partially due to appearance of osteophytes which inhibited full inversion of epiglottis over larynx.  There was a prominent CP, leading to material collecting above.  Reduced propulsion through the pharynx/PES was also related to inferred weakness of the base of tongue/pharyngeal wall. There was consistent penetration of thin liquids, often to the level of the vocal folds. Penetration elicited a frequent cough response; there was minimal aspiration.  Solid food residue tended to remain in the valleculae and was difficult to transition.  A chin tuck was trialed, it may have offered marginal benefit in bolus transfer of solids and airway protection with thin liquids. Spent time after the study reviewing imaging  in pt's room and discussing results with pt/husband.  She should try tucking chin with all solids/liquids. Continue current diet (regular solids/thin liquids); Pt and husband will need a lot of reinforcement to recall results and precautions. SLP will follow while admitted.   Recommendations/Plan: Swallowing Evaluation Recommendations Swallowing Evaluation Recommendations Recommendations: PO diet PO Diet Recommendation: Regular; Thin liquids (Level 0) Liquid Administration via: Cup; Straw Medication Administration: Whole meds with puree Supervision: Patient able to self-feed Swallowing strategies  : Chin tuck Postural  changes: Position pt fully upright for meals   CT head (11/04/22): IMPRESSION: No acute intracranial abnormality noted.   Chronic ischemic changes are noted.   Echo (10/16/22): 1. Left ventricular ejection fraction, by estimation, is 65 to 70%. The  left ventricle has normal function. The left ventricle has no regional  wall motion abnormalities. Left ventricular diastolic function could not  be evaluated.   2. Right ventricular systolic function is normal. The right ventricular  size is normal. There is severely elevated pulmonary artery systolic  pressure. The estimated right ventricular systolic pressure is 62.1 mmHg.   3. Left atrial size was severely dilated.   4. Right atrial size was severely dilated.   5. The mitral valve is normal in structure. Mild to moderate mitral valve  regurgitation.   6. Tricuspid valve regurgitation is moderate to severe.   7. The aortic valve is tricuspid. There is mild calcification of the  aortic valve. There is moderate thickening of the aortic valve. Aortic  valve regurgitation is trivial. Moderate aortic valve stenosis.   8. The inferior vena cava is dilated in size with <50% respiratory  variability, suggesting right atrial pressure of 15 mmHg.    CT thoracic spine wo contrast (07/11/22): IMPRESSION: Chronic T8 compression fracture with progressive anterior height loss, measuring up to 60%, previously 20% on CT June 2022, with exaggerated thoracic kyphosis centered at T8. Unchanged 3 mm inferior endplate retropulsion with resultant mild spinal canal stenosis at this level.   No new compression fracture.   Mild multilevel degenerative changes of the thoracic spine.   3 mm pulmonary nodule in the left upper lobe. No follow-up needed if patient is low-risk. A 12 month follow-up chest CT can be considered if the patient is high-risk.This recommendation follows the consensus statement: Guidelines for Management of Incidental Pulmonary  Nodules Detected on CT Images: From the Fleischner Society 2017; Radiology 2017; 284:228-243.   CT cervical spine wo contrast (08/09/21): IMPRESSION: 1. Slight anterior subluxations at C2-3 and C3-4, similar to prior study, likely degenerative. 2. No acute displaced fractures identified. 3. Diffuse degenerative changes.   CT head wo contrast (09/04/21): IMPRESSION: Atrophy, chronic microvascular disease.   No acute intracranial abnormality.  ASSESSMENT: This is Akirra Zbikowski, a 89 y.o. female with:  ***  Plan: ***  Return to clinic in ***  Total time spent reviewing records, interview, history/exam, documentation, and coordination of care on day of encounter:  *** min  Venetia Potters, MD

## 2024-06-05 ENCOUNTER — Ambulatory Visit (HOSPITAL_BASED_OUTPATIENT_CLINIC_OR_DEPARTMENT_OTHER): Admitting: Cardiology

## 2024-06-05 ENCOUNTER — Other Ambulatory Visit: Payer: Self-pay | Admitting: Family Medicine

## 2024-06-06 ENCOUNTER — Other Ambulatory Visit: Payer: Self-pay | Admitting: Internal Medicine

## 2024-06-06 DIAGNOSIS — I4819 Other persistent atrial fibrillation: Secondary | ICD-10-CM

## 2024-06-08 ENCOUNTER — Encounter (HOSPITAL_BASED_OUTPATIENT_CLINIC_OR_DEPARTMENT_OTHER): Payer: Self-pay

## 2024-06-08 NOTE — Telephone Encounter (Signed)
 Eliquis  2.5mg  refill request received. Patient is 89 years old, weight-52.2kg, Crea-0.26 on 03/25/24, Diagnosis-Afib, and last seen by Dr. Cherri on 04/30/24. Dose is appropriate based on dosing criteria. Will send in refill to requested pharmacy.

## 2024-06-11 ENCOUNTER — Ambulatory Visit (INDEPENDENT_AMBULATORY_CARE_PROVIDER_SITE_OTHER): Admitting: Cardiology

## 2024-06-11 ENCOUNTER — Encounter (HOSPITAL_BASED_OUTPATIENT_CLINIC_OR_DEPARTMENT_OTHER): Payer: Self-pay | Admitting: Cardiology

## 2024-06-11 VITALS — BP 171/93 | HR 70 | Ht 64.0 in | Wt 115.0 lb

## 2024-06-11 DIAGNOSIS — I350 Nonrheumatic aortic (valve) stenosis: Secondary | ICD-10-CM

## 2024-06-11 DIAGNOSIS — D6869 Other thrombophilia: Secondary | ICD-10-CM | POA: Diagnosis not present

## 2024-06-11 DIAGNOSIS — I4821 Permanent atrial fibrillation: Secondary | ICD-10-CM | POA: Diagnosis not present

## 2024-06-11 DIAGNOSIS — I5032 Chronic diastolic (congestive) heart failure: Secondary | ICD-10-CM

## 2024-06-11 DIAGNOSIS — I272 Pulmonary hypertension, unspecified: Secondary | ICD-10-CM

## 2024-06-11 DIAGNOSIS — Z7901 Long term (current) use of anticoagulants: Secondary | ICD-10-CM | POA: Diagnosis not present

## 2024-06-11 DIAGNOSIS — R600 Localized edema: Secondary | ICD-10-CM

## 2024-06-11 DIAGNOSIS — Z7189 Other specified counseling: Secondary | ICD-10-CM

## 2024-06-11 MED ORDER — FUROSEMIDE 20 MG PO TABS: 20.0000 mg | ORAL_TABLET | Freq: Every day | ORAL | Status: DC

## 2024-06-11 NOTE — Progress Notes (Signed)
 Cardiology Office Note:  .   Date:  06/11/2024  ID:  Deanna Rivera, DOB 21-Aug-1932, MRN [id number] PCP: Jodie Lavern CROME, MD  [facility] Providers Cardiologist:  None Electrophysiologist:  Danelle Birmingham, MD {  History of Present Illness: .   Deanna Rivera is a 89 y.o. female with PMH inclusion body myositis (wheelchair bound), aortic stenosis, SSS s/p PPM, atrial fibrillation, chronic diastolic heart failure, venous insufficiency and chronic LE edema.  She is seen as a new patient evaluation at the request of Dr. Birmingham. Referral from 01/10/24 reviewed. She has not been followed by general cardiology in the past, has seen subspecialists as noted.  She has seen Dr. Wendel in structural clinic for her AS, felt that TAVR unlikely to improve symptoms given comorbidities. Seen by Dr. Nance in advanced heart failure given challenges with edema, started on spironolactone  and furosemide . Some concern for pulmonary hypertension but not felt to be a good candidate for therapy, so further evaluation deferred.  She would like to avoid all procedures and surgeries if possible. She has questions about goals of care/DNR. We discussed at length. She follows with palliative care, but she and her husband have opposing views on this.  Has high blood pressure at the end of the day, every day. Usually 180s systolic at the end of the day. She takes her metoprolol  in the evening.  Doesn't check in the morning recently (she was told not to), but in the past when she has checked, it is low in the morning.  Was previously on 25 mg metoprolol , but cut to 12.5 mg dose by her PCP due to low blood pressure. Feels tired when BP is low, feels overheated and poor when it is high.   Was placed on spironolactone  and lasix , but spironolactone  upset her stomach. Takes furosemide  every morning. Swelling is present in the morning and not significantly improved after lasix , similar later in the day. Breathing is always  short, she feels it is worsening due to inability to sit up straight. Sleeps propped on 2 regular and 1 small pillows.   ROS: Denies chest pain or unexpected weight gain. No syncope or palpitations. ROS otherwise negative except as noted.   Studies Reviewed: SABRA    EKG:  EKG Interpretation Date/Time:  Thursday June 11 2024 13:33:36 EDT Ventricular Rate:  70 PR Interval:    QRS Duration:  80 QT Interval:  358 QTC Calculation: 386 R Axis:   11  Text Interpretation: Atrial fibrillation with frequent ventricular-paced complexes Septal infarct , age undetermined Non-specific ST-t changes Confirmed by Lonni Slain [phone]) on 06/11/2024 2:25:56 PM    Physical Exam:   VS:  BP (!) 171/93 (BP Location: Left Arm, Patient Position: Sitting)   Pulse 70   Ht 5' 4 (1.626 m)   Wt 115 lb (52.2 kg)   SpO2 97%   BMI 19.74 kg/m    Wt Readings from Last 3 Encounters:  06/11/24 115 lb (52.2 kg)  05/28/24 115 lb (52.2 kg)  04/30/24 117 lb (53.1 kg)    GEN: Frail, elderly woman in wheelchair, in NAD HEENT: Normal, moist mucous membranes NECK: No JVD appreciated sitting upright CARDIAC: irregularly irregular rhythm, normal S1 and S2, no rubs or gallops. 3/6 systolic murmur. VASCULAR: Radial and DP pulses 2+ bilaterally. No carotid bruits RESPIRATORY:  Clear to auscultation without rales, wheezing or rhonchi  ABDOMEN: Soft, non-tender, non-distended MUSCULOSKELETAL:  in wheechair SKIN: Warm and dry, bilateral 2+ pitting LE edema L>R NEUROLOGIC:  Alert  and oriented x 3. No focal neuro deficits noted. PSYCHIATRIC:  Normal affect    ASSESSMENT AND PLAN: .    Permanent atrial fibrillation Secondary hypercoagulable state, on long term anticoagulation CHA2DS2/VAS Stroke Risk Points = 5  -continue reduced dose apixaban     Aortic stenosis, severe by AVA, low flow low gradient -seen by Dr. Wendel. Not felt to be a good candidate. On our discussion, she states that she would not want  surgery or procedure like TAVR. We discussed that aortic stenosis is mechanical problem, side effects can be managed (to a point) but ultimately severe AS is life limiting. She understands. See goals of care below  Chronic diastolic heart failure Pulmonary hypertension (likely secondary) Chronic LE edema -seen by Dr. Army. Started on spironolactone , did not tolerate. Now on furosemide  alone. See below re: blood pressure for discussion -reports not significantly helped by elevation  Labile blood pressure -very difficult situation. With very low pressures in the AM and high by the end of day. With severe AS, want to minimize hypotension. We may have to tolerate PM spikes. We did discuss possibly adding PM dose of lasix  given edema as well as BP, would need to monitor potassium on this. She will consider.  Goals of care Discussed hospice. She has palliative care already established. She would be content to go into hospice now. We discussed that with severe AS with no plans to intervene, she does qualify, although life expectancy can vary from less than a year to multiple years. Husband does not think hospice is a good idea, has many concerns. I explained the options as best as I can, but I recommend they discuss in more detail with their established palliative care team.  Dispo: 3 mos, does not wish to keep follow up with structural clinic (does not want procedures)  Total time of encounter: I spent 45 minutes dedicated to the care of this patient on the date of this encounter to include pre-visit review of records, face-to-face time with the patient discussing conditions above, and clinical documentation with the electronic health record. We specifically spent time today discussing complex issues listed above (aortic stenosis, heart failure, edema, blood pressure, goals of care)  Signed, Shelda Bruckner, MD   Shelda Bruckner, MD, PhD, [facility] [place]  [facility]  [facility] at [facility] [address], [address] [place], [place] [address] [phone]

## 2024-06-11 NOTE — Patient Instructions (Signed)
 Medication Instructions:  No changes *If you need a refill on your cardiac medications before your next appointment, please call your pharmacy*  Lab Work: none   Testing/Procedures: none  Follow-Up: At [facility], you and your health needs are our priority.  As part of our continuing mission to provide you with exceptional heart care, our providers are all part of one team.  This team includes your primary Cardiologist (physician) and Advanced Practice Providers or APPs (Physician Assistants and Nurse Practitioners) who all work together to provide you with the care you need, when you need it.  Your next appointment:   3 month(s)  Provider:   Shelda Bruckner, MD, Rosaline Bane, NP, or Reche Finder, NP

## 2024-06-12 ENCOUNTER — Ambulatory Visit: Admitting: Neurology

## 2024-07-07 ENCOUNTER — Telehealth: Payer: Self-pay

## 2024-07-07 NOTE — Telephone Encounter (Signed)
 Copied from CRM [id number]. Topic: Clinical - Prescription Issue >> Jul 07, 2024  1:30 PM Lauren C wrote: Reason for CRM: Courney with [facility] calling to let us  know levothyroxine  rx is changing manufacturers and they need both the OK from the patient and the provider. Please return call at [phone]  [facility] [id number] - [place], [place] - [address] [place] [address] Phone: [phone] Fax: [phone]

## 2024-07-08 NOTE — Telephone Encounter (Signed)
 Called [facility] to give verbal ok for manufacture change for med Per Dr. Jodie.

## 2024-07-14 ENCOUNTER — Other Ambulatory Visit: Payer: Self-pay | Admitting: Family Medicine

## 2024-07-22 NOTE — Progress Notes (Deleted)
 I saw Deanna Rivera in neurology clinic on 07/31/24 in follow up for inclusion body myositis.  HPI: Deanna Rivera is a 89 y.o. year old female with a history of HTN, CHF, afib s/p PM, hypothyroidism (Hashimoto's), polymyalgia rheumatica, and prior presumptive diagnosis of inclusion body myositis who we last saw on 11/13/23.  To briefly review: Initial consult (08/14/22): Patient had symptoms since at least 1970s when she was having weakness. She was told she had lupus. She had improvement of symptoms. She had weakness for many years at least prior to 2009. Per records: Patient has had symptoms for many years. She has previously been seen by neurology at [facility] (Dr. Nelida 02/05/2008), and [facility]. She had an EMG in 2009 that showed myopathic changes in multiple muscles and a muscle biopsy that showed inflammatory infiltrates per documentation from [facility] in 2019. 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 She may have also been diagnosed with polymyalgia rheumatica or polymyositis in the past as well, per patient. Of note, CK in 2019 was 404.   Patient is currently in wheelchair most of the time for about 3 months. Prior to this, patient was using a walker. When she using her walker, she would fall very often (more than 60 times). She was progressively getting weaker over the years. About 1 year ago she fell and broke a T9 vertebrae. Since this, she has particularly gotten worse. She was told to use a wheelchair to prevent falls. Patient continues to work with PT but is not improving. Patient was referred to NSGY by PCP ([facility]) who said the fracture had healed but that there was degenerative changes and things did not heal well. She is not a surgical candidate per patient.    Patient has had difficulty swallowing for years (liquids and solids). Of late, it has been worse. Patient was found to have a left neck mass. Patient was to have a swallow test and CT of  neck, but has not done either.   Patient denies significant shortness of breath, including when laying flat.   Per patient, she has had longstanding numbness in feet (called it neuropathy).  She can have some pins and needles at night (mild).   She also endorses poor sleep. She is not sure why she does not sleep well, but says her husband has to get up multiple times per night that wakes patient and she cannot fall back asleep.   Patient currently lives in independent living. She does not like the food and sometimes misses meals. She denies significant weight loss. She gets PT at her facility.   Family history of neuropathy/myopathy/NM disease? Daughter had scleroderma and died at age 31.   Patient has been on long term steroids for decades. She is currently on 1 mg of prednisone  daily.   02/13/23: Per my telephone note from 08/21/22: Called patient to discuss the results of her testing. Overall, her CK and ESR are similar to prior. Her CK has been higher in the past. I think her muscle disease is slowly progressive, as would be expected in IBM (what has been her presumed diagnosis in the past). Her ANA was positive (1:1280) with a negative ENA. Patient does not currently see rheumatology. I discussed that they would be helpful to make sure there was not something we were missing. Patient was in agreement with this. 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 She is agreeable to hematology referral for this.  Patient saw hematology who had a low suspicion for MM and felt IFE represented MGUS.   Patient was admitted from 11/03/22 to 11/07/22 for weakness in the setting of recent flu A infection. Per DC summary:  SLP evaluated and patient was taken for an MBS and she is noted to have mild to moderate primary pharyngeal dysphagia is likely chronic in nature and related to other degenerative changes and spurring in the cervical vertebra as well as generalized weakness and oral consent was largely  functional. She had a markedly reduced laryngeal closure tongue base retraction and epiglottic movement of pharyngeal esophageal segment opening. SLP feels that she has been intermittently aspirating for some time and overall weakness has been exacerbated her swallowing difficulty and SLP feels like her dysphagia will wax and wane and the adverse consequences of aspiration will recur at some point in the future.    Patient went to rehab after hospitalization. She feels like baseline in some regards, but now has more difficulty getting up. She has days that she feels strong and some days she feel weak.    Regarding her swallowing, she does endorse difficulty with swallowing. She is choking daily, but maybe not every meal. Patient got speech therapy as well.   05/22/23: Patient is doing similar to prior. She reports no new issues. She heard people about the power wheelchair, but never heard further after the initial call.   Patient is doing PT and OT. Speech therapy has not started working with her yet, but will. Patient's swallowing continues to get worse. She finds it hard to swallow, including water.    Patient saw rheumatology and had negative myositis work up.   11/13/23: Patient now has a power chair. She thinks it is very helpful. She does not need as much help from her husband.   Patient thinks her swallowing is about the same as prior. She thinks her left neck makes a noise that only she can hear. She cannot describe it further. She chokes frequently.   She thinks her weakness is similar to prior. She is doing PT/OT twice per week currently.  Most recent Assessment and Plan (11/13/23): This is Deanna Rivera, a 89 y.o. female with progressive weakness since at least the 19s. Her neurological examination is pertinent for mild dysarthria, generalized weakness both distally and proximally, diminished sensation in bilateral lower extremities, hyporeflexia, and inability to ambulate. Previous  extensive work up is summarized above but includes MRI showing fatty infiltration of vastus lateralis and semimembranosus muscles. EMG in 2009 showed normal sensory responses, reduced motor responses, and fibrillation potentials on needle examination. Muscle biopsy in 2009 showed active inflammation and muscle deinnervation. She was given the presumptive diagnosis of inclusion body myositis. Patient is currently wheelchair bound since late 2023.    Patient's symptoms have been present for decades and slowly progressive despite therapy. This is likely in keeping with previous diagnosis of inclusion body myositis (IBM). This may also be responsible for dysphagia as there has not been another etiology identified. The lack of sensation in lower extremities and neuropathy patient describes would be be expected in IBM though. 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 We previously discussed repeating an EMG to clarify, but patient has not interested in pursuing that.   Plan: -Continue PT and OT -Again discussed PEG. Patient is not interested currently. Discussed swallow precautions. -Fall precautions discussed  Since their last visit: ***  Weakness? Falls? Swallowing? PT/OT?   MEDICATIONS:  Outpatient Encounter Medications as of 07/31/2024  Medication Sig   acetaminophen  (TYLENOL ) 500 MG tablet Take 500 mg by mouth every 6 (six) hours as needed for mild pain or headache.   apixaban  (ELIQUIS ) 2.5 MG TABS tablet TAKE 1 TABLET BY MOUTH TWICE  DAILY   digoxin  (LANOXIN ) 0.125 MG tablet TAKE 1 TABLET BY MOUTH DAILY   dorzolamide-timolol (COSOPT) 22.3-6.8 MG/ML ophthalmic solution Place 1 drop into both eyes 2 (two) times daily.   furosemide  (LASIX ) 20 MG tablet Take 1 tablet (20 mg total) by mouth daily.   latanoprost (XALATAN) 0.005 % ophthalmic solution Place 1 drop into both eyes at bedtime.   levothyroxine  (SYNTHROID ) 125 MCG tablet TAKE 1 TABLET BY MOUTH DAILY   metoprolol  succinate  (TOPROL -XL) 25 MG 24 hr tablet Take 0.5 tablets (12.5 mg total) by mouth daily.   polyethylene glycol (MIRALAX  / GLYCOLAX ) 17 g packet Take 17 g by mouth daily as needed for severe constipation.   predniSONE  (DELTASONE ) 1 MG tablet Take 1 tablet (1 mg total) by mouth daily.   No facility-administered encounter medications on file as of 07/31/2024.    PAST MEDICAL HISTORY: Past Medical History:  Diagnosis Date   Cataract    Hypertension    Moderate aortic stenosis    Moderate mitral regurgitation    Persistent atrial fibrillation (HCC)    PMR (polymyalgia rheumatica)    Second degree AV block    Sick sinus syndrome (HCC)    Thyroid  disease     PAST SURGICAL HISTORY: Past Surgical History:  Procedure Laterality Date   ABDOMINAL HYSTERECTOMY     APPENDECTOMY     CESAREAN SECTION     x 3   CHOLECYSTECTOMY     PACEMAKER GENERATOR CHANGE  11/20/2018   [employer] Accolade MRI PPM implanted in [place]    PACEMAKER IMPLANT     PILONIDAL CYST EXCISION      ALLERGIES: Allergies  Allergen Reactions   Shellfish Protein-Containing Drug Products Shortness Of Breath and Other (See Comments)    Lips go numb   Epinephrine  Other (See Comments) and Hypertension    Makes me feel wired   Monosodium Glutamate Other (See Comments)    other   Monosodium Glutamate Hypertension and Other (See Comments)    Makes me feel badly  Other Reaction(s): Other (See Comments)  other, other   Ciprofloxacin Other (See Comments)    Allergic reaction not recalled  Other Reaction(s): Other (See Comments)  Other reaction(s): Intolerance   Dronedarone     Other Reaction(s): Other (See Comments)  Leg swelling, Leg swelling   Erythromycin Other (See Comments)    Allergic reaction not recalled   Sulfa Antibiotics Other (See Comments)    Allergic reaction not recalled   Sulfamethoxazole-Trimethoprim    Tetracyclines & Related Other (See Comments)    Allergic reaction not recalled     FAMILY HISTORY: Family History  Problem Relation Age of Onset   Early death Mother 39   Rheumatic fever Mother    Heart disease Mother        valve replacement   Cirrhosis Father 25   Hypertension Father    Hypertension Brother    Lung cancer Brother    Hypertension Brother    Other Brother        smoker   Scleroderma Daughter    Hypertension Son    Hypertension Son     SOCIAL HISTORY: Social History   Tobacco Use   Smoking status: Former    Types: Cigarettes    Passive  exposure: Past   Smokeless tobacco: Never  Vaping Use   Vaping status: Never Used  Substance Use Topics   Alcohol use: Not Currently   Drug use: Not Currently   Social History   Social History Narrative   ** Merged History Encounter **       Recently moved from [place] . In independent living with her husband. Daughter died from scleroderma complications. Hx of falls, but last was > 1.5 years ago. 01/18/2019     Objective:  Vital Signs:  There were no vitals taken for this visit.  General:*** General appearance: Awake and alert. No distress. Cooperative with exam.  Skin: No obvious rash or jaundice. HEENT: Atraumatic. Anicteric. Lungs: Non-labored breathing on room air  Heart: Regular Abdomen: Soft, non tender. Extremities: No edema. No obvious deformity.  Musculoskeletal: No obvious joint swelling.  Neurological: Mental Status: Alert. Speech fluent. No pseudobulbar affect Cranial Nerves: CNII: No RAPD. Visual fields intact. CNIII, IV, VI: PERRL. No nystagmus. EOMI. CN V: Facial sensation intact bilaterally to fine touch. [place] clench strong. Jaw jerk***. CN VII: Facial muscles symmetric and strong. No ptosis at rest or after sustained upgaze***. CN VIII: Hears finger rub well bilaterally. CN IX: No hypophonia. CN X: Palate elevates symmetrically. CN XI: Full strength shoulder shrug bilaterally. CN XII: Tongue protrusion full and midline. No atrophy or fasciculations. No  significant dysarthria*** Motor: Tone is ***. *** fasciculations in *** extremities. *** atrophy. No grip or percussive myotonia.  Individual muscle group testing (MRC grade out of 5):  Movement     Neck flexion ***    Neck extension ***     Right Left   Shoulder abduction *** ***   Shoulder adduction *** ***   Shoulder ext rotation *** ***   Shoulder int rotation *** ***   Elbow flexion *** ***   Elbow extension *** ***   Wrist extension *** ***   Wrist flexion *** ***   Finger abduction - FDI *** ***   Finger abduction - ADM *** ***   Finger extension *** ***   Finger distal flexion - 2/3 *** ***   Finger distal flexion - 4/5 *** ***   Thumb flexion - FPL *** ***   Thumb abduction - APB *** ***    Hip flexion *** ***   Hip extension *** ***   Hip adduction *** ***   Hip abduction *** ***   Knee extension *** ***   Knee flexion *** ***   Dorsiflexion *** ***   Plantarflexion *** ***   Inversion *** ***   Eversion *** ***   Great toe extension *** ***   Great toe flexion *** ***     Reflexes:  Right Left  Bicep *** ***  Tricep *** ***  BrRad *** ***  Knee *** ***  Ankle *** ***   Pathological Reflexes: Babinski: *** response bilaterally*** Hoffman: *** Troemner: *** Pectoral: *** Palmomental: *** Facial: *** Midline tap: *** Sensation: Pinprick: *** Vibration: *** Temperature: *** Proprioception: *** Coordination: Intact finger-to- nose-finger and heel-to-shin bilaterally. Romberg negative.*** Gait: Able to rise from chair with arms crossed unassisted. Normal, narrow-based gait. Able to tandem walk. Able to walk on toes and heels.***   Lab and Test Review: New results: TSH (05/28/24): wnl (3.25)  03/25/24: TSH: 7.66 CMP unremarkable Lipid panel: tChol 190, LDL 109, TG 72.0   Previously reviewed results: Chem (08/13/23): unremarkable   CBC w/ diff (11/06/23): unremarkable CMP (11/06/23): unremarkable   02/28/23: C3/C4 wnl  dsDNA  wnl Anti-Sm wnl Scl-70 wnl RNP ab wnl SSA and SSB wnl   02/21/23: CK: 254 Aldolase wnl ESR: 79 ANA positive (1:320, nuclear, homogeneous) Vit D wnl HMGCoAR ab < 2 Myositis panel: Component     Latest Ref Rng 02/21/2023  JO-1 AB     <11 SI <11   PL-7 AB     <11 SI <11   PL-12 AB     <11 SI <11   EJ AB     <11 SI <11   OJ AB     <11 SI <11   SRP-AB     <11 SI <11   MI-2 ALPHA AB     <11 SI <11   MI-2 BETA AB     <11 SI <11   MDA-5 AB     <11 SI <11   TIF-1y AB     <11 SI <11   NXP-2 AB     <11 SI <11    08/14/22: IFE: IgG lambda monoclonal ab ANA positive: 1:1280, homogeneous dsDNA, RNP, Sm, Scl-70, SSA, SSB negative B1 wnl B12: 493 CK: 251 ESR: 73   Kappa/lambda ratio wnl   CK (10/29/22): 121 TSH (10/29/22): 0.501 ESR (11/04/22): 58 CK (11/04/22): 61  External labs: Per [facility] clinic note from 02/05/2008: CK: since 2002, levels in the 192 to 440 (440 in January 2009. December 03 2007 (5 days post emg) 268 ESR remains high since 2002 (44-82)  EMG elsewhere (april 17th 2009) Normal upper extremities sensory nerve responses, but for prolongation of median latency, low motor amplitudes ulnar and tibial, needle electrode examination shows fibrillations and positive sharp waves and small motor unit potentials in all muscles tested (proximal and paraspinals- no hand-forearm muscles).  US  neck (may 2009): multinodular goiter  MRI left leg (february 2009) Signal abn in vastus lateralis, fatty infiltration in vastus lat and semimembranosus Right leg: vastus changes  Muscle biopsy (april 1st 2009) Active inflammation with muscle fiber destruction and invasion by lymphocytes, mild type 2 atrophy, stains normal, no inclusion (no EM done)  Blood tests June 15th Cmp normal Ck 124 Crp N ESR 44 No monoclonal protein in blood and urine Hepatitis B serology- no exposure ANA, ENA, RF negative TSH 0.27 PTH 72 (could indicate low vitamin D ) CBC N Urinalysis normal     Swallow evaluation (11/05/22): Clinical Impression: Clinical Impression: Ms. Kottke presents with a mild-moderate primary pharyngeal dysphagia that is likely chronic in nature and related to degenerative changes and spurring in cervical vertebrae as well as generalized weakness.  Oral phase was largely functional.  Pharyngeal phase was marked by reduced laryngeal closure, tongue base retraction, epiglottic movement, and pharyngoesophageal segment opening.  These impairments in the swallow were partially due to appearance of osteophytes which inhibited full inversion of epiglottis over larynx.  There was a prominent CP, leading to material collecting above.  Reduced propulsion through the pharynx/PES was also related to inferred weakness of the base of tongue/pharyngeal wall. There was consistent penetration of thin liquids, often to the level of the vocal folds. Penetration elicited a frequent cough response; there was minimal aspiration.  Solid food residue tended to remain in the valleculae and was difficult to transition.  A chin tuck was trialed, it may have offered marginal benefit in bolus transfer of solids and airway protection with thin liquids. Spent time after the study reviewing imaging in pt's room and discussing results with pt/husband.  She should try tucking chin with all solids/liquids. Continue current  diet (regular solids/thin liquids); Pt and husband will need a lot of reinforcement to recall results and precautions. SLP will follow while admitted.   Recommendations/Plan: Swallowing Evaluation Recommendations Swallowing Evaluation Recommendations Recommendations: PO diet PO Diet Recommendation: Regular; Thin liquids (Level 0) Liquid Administration via: Cup; Straw Medication Administration: Whole meds with puree Supervision: Patient able to self-feed Swallowing strategies  : Chin tuck Postural changes: Position pt fully upright for meals   CT head (11/04/22): IMPRESSION: No acute  intracranial abnormality noted.   Chronic ischemic changes are noted.   Echo (10/16/22): 1. Left ventricular ejection fraction, by estimation, is 65 to 70%. The  left ventricle has normal function. The left ventricle has no regional  wall motion abnormalities. Left ventricular diastolic function could not  be evaluated.   2. Right ventricular systolic function is normal. The right ventricular  size is normal. There is severely elevated pulmonary artery systolic  pressure. The estimated right ventricular systolic pressure is 62.1 mmHg.   3. Left atrial size was severely dilated.   4. Right atrial size was severely dilated.   5. The mitral valve is normal in structure. Mild to moderate mitral valve  regurgitation.   6. Tricuspid valve regurgitation is moderate to severe.   7. The aortic valve is tricuspid. There is mild calcification of the  aortic valve. There is moderate thickening of the aortic valve. Aortic  valve regurgitation is trivial. Moderate aortic valve stenosis.   8. The inferior vena cava is dilated in size with <50% respiratory  variability, suggesting right atrial pressure of 15 mmHg.    CT thoracic spine wo contrast (07/11/22): IMPRESSION: Chronic T8 compression fracture with progressive anterior height loss, measuring up to 60%, previously 20% on CT June 2022, with exaggerated thoracic kyphosis centered at T8. Unchanged 3 mm inferior endplate retropulsion with resultant mild spinal canal stenosis at this level.   No new compression fracture.   Mild multilevel degenerative changes of the thoracic spine.   3 mm pulmonary nodule in the left upper lobe. No follow-up needed if patient is low-risk. A 12 month follow-up chest CT can be considered if the patient is high-risk.This recommendation follows the consensus statement: Guidelines for Management of Incidental Pulmonary Nodules Detected on CT Images: From the Fleischner Society 2017; Radiology 2017; 284:228-243.    CT cervical spine wo contrast (08/09/21): IMPRESSION: 1. Slight anterior subluxations at C2-3 and C3-4, similar to prior study, likely degenerative. 2. No acute displaced fractures identified. 3. Diffuse degenerative changes.   CT head wo contrast (09/04/21): IMPRESSION: Atrophy, chronic microvascular disease.   No acute intracranial abnormality.  ASSESSMENT: This is Deanna Rivera, a 89 y.o. female with:  ***  Plan: ***  Return to clinic in ***  Total time spent reviewing records, interview, history/exam, documentation, and coordination of care on day of encounter:  *** min  Venetia Potters, MD

## 2024-07-24 ENCOUNTER — Ambulatory Visit: Payer: Self-pay | Admitting: *Deleted

## 2024-07-24 NOTE — Telephone Encounter (Signed)
 FYI Only or Action Required?: Action required by provider: medication refill request.  Patient was last seen in primary care on 05/28/2024 by Jodie Lavern CROME, MD.  Called Nurse Triage reporting Dysuria.  Symptoms began several weeks ago.  Interventions attempted: Nothing.  Symptoms are: gradually worsening.  Triage Disposition: See Physician Within 24 Hours  Patient/caregiver understands and will follow disposition?: No, refuses disposition  Copied from CRM #[id number]. Topic: Clinical - Red Word Triage >> Jul 24, 2024  2:57 PM Carlyon D wrote: Red Word that prompted transfer to Nurse Triage: UTI, extreme burning, and extreme odor took at home UTI test came positive Reason for Disposition  Age > 50 years  Answer Assessment - Initial Assessment Questions Patient states she is unable to come for appointment- she is requesting medication. Patient states her husband is having surgery next week and she does not have transportation. Patient advised of office policy regarding antibiotic request and also policy with calls coming to office after 2pm. Patient is aware. Still requesting send request- [facility] [place] is pharmacy. Patient advised UC if her symptoms should get worse- increased pain, fever  1. SEVERITY: How bad is the pain?  (e.g., Scale 1-10; mild, moderate, or severe)     Burning with urination- patient states she has burning then denies pain. Very concerned with odor of urine 2. FREQUENCY: How many times have you had painful urination today?      none 3. PATTERN: Is pain present every time you urinate or just sometimes?      not all the time 4. ONSET: When did the painful urination start?      Over 2 weeks 5. FEVER: Do you have a fever? If Yes, ask: What is your temperature, how was it measured, and when did it start?     no 6. PAST UTI: Have you had a urine infection before? If Yes, ask: When was the last time? and What happened that time?      Yes- it  has been while- over 1 month 7. CAUSE: What do you think is causing the painful urination?  (e.g., UTI, scratch, Herpes sore)     UTI 8. OTHER SYMPTOMS: Do you have any other symptoms? (e.g., blood in urine, flank pain, genital sores, urgency, vaginal discharge)     Fatigue, strong odor with urine  Protocols used: Urination Pain - Female-A-AH

## 2024-07-30 ENCOUNTER — Other Ambulatory Visit: Payer: Self-pay

## 2024-07-30 DIAGNOSIS — R300 Dysuria: Secondary | ICD-10-CM

## 2024-07-30 MED ORDER — NITROFURANTOIN MONOHYD MACRO 100 MG PO CAPS: 100.0000 mg | ORAL_CAPSULE | Freq: Two times a day (BID) | ORAL | 0 refills | Status: DC

## 2024-07-30 NOTE — Telephone Encounter (Signed)
 Patient still having pain with urination.  Sent Macrobid  prescription in per Dr. Jodie

## 2024-07-31 ENCOUNTER — Ambulatory Visit: Admitting: Neurology

## 2024-08-03 ENCOUNTER — Telehealth: Payer: Self-pay

## 2024-08-03 NOTE — Telephone Encounter (Signed)
 Copied from CRM [id number]. Topic: Clinical - Prescription Issue >> Aug 03, 2024  2:55 PM Anairis L wrote: Reason for RMF:[id number] can not take pill, she is requesting a liquid form of  nitrofurantoin , macrocrystal-monohydrate, (MACROBID ) 100 MG capsule,pharmacist informed her no liquid form is available so she is requesting the last antibiotic that was prescribe to her in liquid form.    Please advise.  Please see pt call and concern regarding antibiotic and unable to take pills. Pt requesting same antibiotic that she previously used to be sent to pharmacy. Please advise

## 2024-08-04 ENCOUNTER — Other Ambulatory Visit: Payer: Self-pay | Admitting: *Deleted

## 2024-08-04 ENCOUNTER — Inpatient Hospital Stay: Attending: Oncology

## 2024-08-04 ENCOUNTER — Ambulatory Visit: Payer: Self-pay | Admitting: Oncology

## 2024-08-04 DIAGNOSIS — D472 Monoclonal gammopathy: Secondary | ICD-10-CM

## 2024-08-04 LAB — CMP (CANCER CENTER ONLY)
ALT: 22 U/L (ref 0–44)
AST: 34 U/L (ref 15–41)
Albumin: 3.9 g/dL (ref 3.5–5.0)
Alkaline Phosphatase: 56 U/L (ref 38–126)
Anion gap: 7 (ref 5–15)
BUN: 23 mg/dL (ref 8–23)
CO2: 30 mmol/L (ref 22–32)
Calcium: 10.9 mg/dL — ABNORMAL HIGH (ref 8.9–10.3)
Chloride: 101 mmol/L (ref 98–111)
Creatinine: 0.31 mg/dL — ABNORMAL LOW (ref 0.44–1.00)
GFR, Estimated: >60 mL/min
Glucose, Bld: 122 mg/dL — ABNORMAL HIGH (ref 70–99)
Potassium: 4.5 mmol/L (ref 3.5–5.1)
Sodium: 139 mmol/L (ref 135–145)
Total Bilirubin: 0.6 mg/dL (ref 0.0–1.2)
Total Protein: 8.1 g/dL (ref 6.5–8.1)

## 2024-08-05 MED ORDER — CEPHALEXIN 250 MG/5ML PO SUSR: 500.0000 mg | Freq: Three times a day (TID) | ORAL | 0 refills | Status: AC

## 2024-08-07 ENCOUNTER — Other Ambulatory Visit: Payer: Self-pay

## 2024-08-07 ENCOUNTER — Other Ambulatory Visit: Payer: Self-pay | Admitting: Family

## 2024-08-07 ENCOUNTER — Telehealth: Payer: Self-pay

## 2024-08-07 ENCOUNTER — Ambulatory Visit: Payer: Medicare Other

## 2024-08-07 DIAGNOSIS — I4821 Permanent atrial fibrillation: Secondary | ICD-10-CM

## 2024-08-07 MED ORDER — NITROFURANTOIN 50 MG/10ML PO SUSP: 100.0000 mg | Freq: Two times a day (BID) | ORAL | 0 refills | Status: DC

## 2024-08-07 NOTE — Telephone Encounter (Signed)
 Copied from CRM [id number]. Topic: Clinical - Prescription Issue >> Aug 07, 2024  3:44 PM Rea C wrote: Reason for CRM: Nitrofurantoin  50 MG/10ML SUSP was not covered by insurance- cost $4000 and would not be available until next week. Can an alternative solution be provided for patient?   [facility] [id number] [place], [place] - [address] [place] [address] Phone: [phone] Fax: [phone]  [phone]  (H)  [phone] [place] Grand-daughter

## 2024-08-07 NOTE — Telephone Encounter (Signed)
 Copied from CRM #[id number]. Topic: Clinical - Medication Question >> Aug 07, 2024  9:16 AM Anairis L wrote: Reason for CRM: Patient is unable to swallow medication nitrofurantoin , macrocrystal-monohydrate, (MACROBID ) 100 MG capsule, she would like a liquid form to be prescribed.    [facility] [id number] [place], [place] - [address] [place] [address] Phone: [phone] Fax: [phone]  Message sent to webb for response

## 2024-08-08 ENCOUNTER — Other Ambulatory Visit: Payer: Self-pay | Admitting: Family

## 2024-08-09 LAB — CUP PACEART REMOTE DEVICE CHECK
Battery Remaining Longevity: 126 mo
Battery Remaining Percentage: 100 %
Brady Statistic RA Percent Paced: 0 %
Brady Statistic RV Percent Paced: 57 %
Date Time Interrogation Session: 20251226064100
Implantable Lead Connection Status: 753985
Implantable Lead Connection Status: 753985
Implantable Lead Implant Date: 20130305
Implantable Lead Implant Date: 20130305
Implantable Lead Location: 753859
Implantable Lead Location: 753860
Implantable Lead Model: 4469
Implantable Lead Model: 4470
Implantable Lead Serial Number: 546229
Implantable Lead Serial Number: 712016
Implantable Pulse Generator Implant Date: 20200409
Lead Channel Impedance Value: 424 Ohm
Lead Channel Pacing Threshold Amplitude: 0.5 V
Lead Channel Pacing Threshold Pulse Width: 0.4 ms
Lead Channel Setting Pacing Amplitude: 1 V
Lead Channel Setting Pacing Pulse Width: 0.4 ms
Lead Channel Setting Sensing Sensitivity: 0.6 mV
Pulse Gen Serial Number: 859007
Zone Setting Status: 755011

## 2024-08-11 ENCOUNTER — Ambulatory Visit: Payer: Self-pay | Admitting: Internal Medicine

## 2024-08-12 ENCOUNTER — Ambulatory Visit: Payer: Self-pay

## 2024-08-12 ENCOUNTER — Other Ambulatory Visit: Payer: Self-pay

## 2024-08-12 ENCOUNTER — Telehealth: Payer: Self-pay | Admitting: Neurology

## 2024-08-12 DIAGNOSIS — R471 Dysarthria and anarthria: Secondary | ICD-10-CM

## 2024-08-12 DIAGNOSIS — R1310 Dysphagia, unspecified: Secondary | ICD-10-CM

## 2024-08-12 NOTE — Telephone Encounter (Signed)
 FYI Only or Action Required?: FYI only for provider: Agreeable to UC as no OV available until Friday.  Patient was last seen in primary care on 05/28/2024 by Jodie Lavern CROME, MD.  Called Nurse Triage reporting Dysuria.  Symptoms began several weeks ago.  Interventions attempted: Prescription medications: Macrobid  (but pt thought she was taking cephalexin ).  Symptoms are: unchanged.  Triage Disposition: See HCP Within 4 Hours (Or PCP Triage)  Patient/caregiver understands and will follow disposition?:  Reason for Disposition  [1] Side (flank) or lower back pain AND [2] new-onset since starting antibiotics  Answer Assessment - Initial Assessment Questions Pt states she started cephalexin  6 days ago for UTI. However, record shows she was prescribed Macrobid . Patient unclear which she is taking. States no improvement as far as she can tell and states she feels terrible. Denies known fever. No OV available, pt agreeable to UC and was provided with location and telephone number.  ED advised if flank pain worsens or if she develops fever, abdominal pain, nausea or vomiting. Pt verbalized understanding.  1. MAIN SYMPTOM: What is the main symptom you are concerned about? (e.g., painful urination, urine frequency)     Dysuria not improving, continues to experience pain and malodorous urine 2. BETTER-SAME-WORSE: Are you getting better, staying the same, or getting worse compared to how you felt at your last visit to the doctor (most recent medical visit)?     Unable to specify, states feels to terrible to tell 4. FEVER: Do you have a fever? If Yes, ask: What is it, how was it measured, and when did it start?     Unknown 5. OTHER SYMPTOMS: Do you have any other symptoms? (e.g., blood in the urine, flank pain, vaginal discharge)     Flank pain 6. DIAGNOSIS: When was the UTI diagnosed? By whom? Was it a kidney infection, bladder infection or both?     States chronic, was  prescribed Macrobid  on 08/07/24  Protocols used: Urinary Tract Infection on Antibiotic Follow-up Call - [facility] Copied from CRM #[id number]. Topic: Clinical - Red Word Triage >> Aug 12, 2024 10:12 AM Kevelyn M wrote: Red Word that prompted transfer to Nurse Triage: Patient calling in because she just finished medicine for a UTI and is still having symptoms and patient says she feels terrible and is still experiencing brown urine and cloudy urine.

## 2024-08-12 NOTE — Telephone Encounter (Signed)
 Orders done with need a PA for orders.

## 2024-08-12 NOTE — Progress Notes (Signed)
 Remote PPM Transmission

## 2024-08-12 NOTE — Telephone Encounter (Signed)
 Noted, Pt going to UC

## 2024-08-12 NOTE — Telephone Encounter (Signed)
 I returned patient's call. She is having worsening swallowing. We had discussed this in the past, including PEG tube. Patient is still not interested in PEG.  She would be willing to get an updated swallow study and speech/swallow therapy.  I will order those today.  All questions were answered.  Venetia Potters, MD [facility]

## 2024-08-12 NOTE — Telephone Encounter (Signed)
 Kharis called and LVM.  PH: [phone]  FYI: Florence Gut back. She stated that she is having more problems swallowing and eating. She has requested a call back for medical advise.

## 2024-08-14 ENCOUNTER — Other Ambulatory Visit: Payer: Self-pay | Admitting: Family Medicine

## 2024-08-14 ENCOUNTER — Other Ambulatory Visit (HOSPITAL_COMMUNITY): Payer: Self-pay | Admitting: *Deleted

## 2024-08-14 ENCOUNTER — Telehealth: Payer: Self-pay

## 2024-08-14 DIAGNOSIS — R1310 Dysphagia, unspecified: Secondary | ICD-10-CM

## 2024-08-14 DIAGNOSIS — R059 Cough, unspecified: Secondary | ICD-10-CM

## 2024-08-14 NOTE — Telephone Encounter (Signed)
 Pt does not need a PA for the Barium Swallow test

## 2024-08-15 ENCOUNTER — Other Ambulatory Visit: Payer: Self-pay | Admitting: Family Medicine

## 2024-08-17 ENCOUNTER — Ambulatory Visit: Payer: Self-pay | Admitting: Family Medicine

## 2024-08-17 NOTE — Telephone Encounter (Signed)
 FYI Only or Action Required?: Action required by provider: Covid Med Request.  Patient was last seen in primary care on 05/28/2024 by Jodie Lavern CROME, MD.  Called Nurse Triage reporting Cough.  Symptoms began yesterday.  Interventions attempted: Nothing.  Symptoms are: stable.  Triage Disposition: No disposition on file.  Patient/caregiver understands and will follow disposition?:  Reason for Disposition  [1] Continuous (nonstop) coughing interferes with work or school AND [2] no improvement using cough treatment per Care Advice  Answer Assessment - Initial Assessment Questions Patient reports her husband tested positive for covid 5 days ago. Patient states she lives in assisted living and would be able to get tested today, and patient states it is too difficult for her to get into the office for an appointment. Patient is requesting if any medication can be sent in to [facility] pharmacy on file. Advised patient to call back with her test results. Please advise.   1. ONSET: When did the cough begin?      Yesterday  2. SEVERITY: How bad is the cough today?      Moderate  3. SPUTUM: Describe the color of your sputum (e.g., none, dry cough; clear, white, yellow, green)     Unable to cough it up, only getting a little up occasionally, does not think theres a color  4. HEMOPTYSIS: Are you coughing up any blood? If Yes, ask: How much? (e.g., flecks, streaks, tablespoons, etc.)     Denies  5. DIFFICULTY BREATHING: Are you having difficulty breathing? If Yes, ask: How bad is it? (e.g., mild, moderate, severe)      Denies  6. FEVER: Do you have a fever? If Yes, ask: What is your temperature, how was it measured, and when did it start?     Unsure  7. CARDIAC HISTORY: Do you have any history of heart disease? (e.g., heart attack, congestive heart failure)      Afib, HF, Hypertension  8. LUNG HISTORY: Do you have any history of lung disease?  (e.g., pulmonary  embolus, asthma, emphysema)     Denies  9. PE RISK FACTORS: Do you have a history of blood clots? (or: recent major surgery, recent prolonged travel, bedridden)    Cannot walk, either in a bed or in a chair  10. OTHER SYMPTOMS: Do you have any other symptoms? (e.g., runny nose, wheezing, chest pain)       Nausea, no appetite, diarrhea  Protocols used: Cough - Acute Non-Productive-A-AH  Copied from CRM #[id number]. Topic: Clinical - Medical Advice >> Aug 17, 2024  8:07 AM Robinson H wrote: Reason for CRM: Patient states she thinks her husband gave her COVID coughing, can't cough up mucus, nauseated, no appetite wants to know what she should do.  Donatella [phone]

## 2024-08-18 ENCOUNTER — Encounter (HOSPITAL_COMMUNITY): Payer: Self-pay

## 2024-08-18 ENCOUNTER — Other Ambulatory Visit: Payer: Self-pay

## 2024-08-18 ENCOUNTER — Ambulatory Visit: Payer: Self-pay

## 2024-08-18 ENCOUNTER — Emergency Department (HOSPITAL_COMMUNITY)

## 2024-08-18 ENCOUNTER — Inpatient Hospital Stay (HOSPITAL_COMMUNITY)
Admission: EM | Admit: 2024-08-18 | Discharge: 2024-08-24 | DRG: 177 | Disposition: A | Discharge status: Hospice | Attending: Internal Medicine | Admitting: Internal Medicine

## 2024-08-18 DIAGNOSIS — Z79890 Hormone replacement therapy (postmenopausal): Secondary | ICD-10-CM

## 2024-08-18 DIAGNOSIS — I2489 Other forms of acute ischemic heart disease: Secondary | ICD-10-CM | POA: Diagnosis present

## 2024-08-18 DIAGNOSIS — Z801 Family history of malignant neoplasm of trachea, bronchus and lung: Secondary | ICD-10-CM

## 2024-08-18 DIAGNOSIS — F419 Anxiety disorder, unspecified: Secondary | ICD-10-CM | POA: Diagnosis present

## 2024-08-18 DIAGNOSIS — E039 Hypothyroidism, unspecified: Secondary | ICD-10-CM | POA: Diagnosis present

## 2024-08-18 DIAGNOSIS — R54 Age-related physical debility: Secondary | ICD-10-CM | POA: Diagnosis present

## 2024-08-18 DIAGNOSIS — Z79899 Other long term (current) drug therapy: Secondary | ICD-10-CM | POA: Diagnosis not present

## 2024-08-18 DIAGNOSIS — U071 COVID-19: Principal | ICD-10-CM | POA: Diagnosis present

## 2024-08-18 DIAGNOSIS — Z66 Do not resuscitate: Secondary | ICD-10-CM | POA: Diagnosis present

## 2024-08-18 DIAGNOSIS — I5033 Acute on chronic diastolic (congestive) heart failure: Secondary | ICD-10-CM | POA: Diagnosis present

## 2024-08-18 DIAGNOSIS — Z9049 Acquired absence of other specified parts of digestive tract: Secondary | ICD-10-CM

## 2024-08-18 DIAGNOSIS — I493 Ventricular premature depolarization: Secondary | ICD-10-CM | POA: Diagnosis present

## 2024-08-18 DIAGNOSIS — I420 Dilated cardiomyopathy: Secondary | ICD-10-CM | POA: Diagnosis present

## 2024-08-18 DIAGNOSIS — Z7952 Long term (current) use of systemic steroids: Secondary | ICD-10-CM

## 2024-08-18 DIAGNOSIS — Z9102 Food additives allergy status: Secondary | ICD-10-CM

## 2024-08-18 DIAGNOSIS — R1310 Dysphagia, unspecified: Secondary | ICD-10-CM | POA: Diagnosis present

## 2024-08-18 DIAGNOSIS — Z90710 Acquired absence of both cervix and uterus: Secondary | ICD-10-CM

## 2024-08-18 DIAGNOSIS — Z950 Presence of cardiac pacemaker: Secondary | ICD-10-CM | POA: Diagnosis not present

## 2024-08-18 DIAGNOSIS — I441 Atrioventricular block, second degree: Secondary | ICD-10-CM | POA: Diagnosis present

## 2024-08-18 DIAGNOSIS — J9601 Acute respiratory failure with hypoxia: Secondary | ICD-10-CM | POA: Diagnosis present

## 2024-08-18 DIAGNOSIS — Z881 Allergy status to other antibiotic agents status: Secondary | ICD-10-CM

## 2024-08-18 DIAGNOSIS — Z8249 Family history of ischemic heart disease and other diseases of the circulatory system: Secondary | ICD-10-CM

## 2024-08-18 DIAGNOSIS — F05 Delirium due to known physiological condition: Secondary | ICD-10-CM | POA: Diagnosis not present

## 2024-08-18 DIAGNOSIS — I350 Nonrheumatic aortic (valve) stenosis: Secondary | ICD-10-CM | POA: Diagnosis present

## 2024-08-18 DIAGNOSIS — Z7901 Long term (current) use of anticoagulants: Secondary | ICD-10-CM | POA: Diagnosis not present

## 2024-08-18 DIAGNOSIS — Z87891 Personal history of nicotine dependence: Secondary | ICD-10-CM | POA: Diagnosis not present

## 2024-08-18 DIAGNOSIS — J159 Unspecified bacterial pneumonia: Secondary | ICD-10-CM | POA: Diagnosis present

## 2024-08-18 DIAGNOSIS — Z888 Allergy status to other drugs, medicaments and biological substances status: Secondary | ICD-10-CM

## 2024-08-18 DIAGNOSIS — Z91013 Allergy to seafood: Secondary | ICD-10-CM

## 2024-08-18 DIAGNOSIS — I110 Hypertensive heart disease with heart failure: Secondary | ICD-10-CM | POA: Diagnosis present

## 2024-08-18 DIAGNOSIS — M353 Polymyalgia rheumatica: Secondary | ICD-10-CM | POA: Diagnosis present

## 2024-08-18 DIAGNOSIS — Z993 Dependence on wheelchair: Secondary | ICD-10-CM

## 2024-08-18 DIAGNOSIS — I4819 Other persistent atrial fibrillation: Secondary | ICD-10-CM | POA: Diagnosis present

## 2024-08-18 DIAGNOSIS — Z882 Allergy status to sulfonamides status: Secondary | ICD-10-CM

## 2024-08-18 LAB — BASIC METABOLIC PANEL WITH GFR
Anion gap: 9 (ref 5–15)
BUN: 20 mg/dL (ref 8–23)
CO2: 31 mmol/L (ref 22–32)
Calcium: 10.7 mg/dL — ABNORMAL HIGH (ref 8.9–10.3)
Chloride: 95 mmol/L — ABNORMAL LOW (ref 98–111)
Creatinine, Ser: 0.39 mg/dL — ABNORMAL LOW (ref 0.44–1.00)
GFR, Estimated: >60 mL/min
Glucose, Bld: 127 mg/dL — ABNORMAL HIGH (ref 70–99)
Potassium: 4 mmol/L (ref 3.5–5.1)
Sodium: 135 mmol/L (ref 135–145)

## 2024-08-18 LAB — RESP PANEL BY RT-PCR (RSV, FLU A&B, COVID)  RVPGX2
Influenza A by PCR: NEGATIVE
Influenza B by PCR: NEGATIVE
Resp Syncytial Virus by PCR: NEGATIVE
SARS Coronavirus 2 by RT PCR: POSITIVE — AB

## 2024-08-18 LAB — PRO BRAIN NATRIURETIC PEPTIDE: Pro Brain Natriuretic Peptide: 2287 pg/mL — ABNORMAL HIGH

## 2024-08-18 LAB — CBC
HCT: 43 % (ref 36.0–46.0)
Hemoglobin: 14.6 g/dL (ref 12.0–15.0)
MCH: 32.8 pg (ref 26.0–34.0)
MCHC: 34 g/dL (ref 30.0–36.0)
MCV: 96.6 fL (ref 80.0–100.0)
Platelets: 195 K/uL (ref 150–400)
RBC: 4.45 MIL/uL (ref 3.87–5.11)
RDW: 13.4 % (ref 11.5–15.5)
WBC: 12.3 K/uL — ABNORMAL HIGH (ref 4.0–10.5)
nRBC: 0 % (ref 0.0–0.2)

## 2024-08-18 LAB — C-REACTIVE PROTEIN: CRP: 13.5 mg/dL — ABNORMAL HIGH

## 2024-08-18 LAB — I-STAT CG4 LACTIC ACID, ED
Lactic Acid, Venous: 1.8 mmol/L (ref 0.5–1.9)
Lactic Acid, Venous: 1.6 mmol/L (ref 0.5–1.9)

## 2024-08-18 LAB — TROPONIN T, HIGH SENSITIVITY
Troponin T High Sensitivity: 118 ng/L (ref 0–19)
Troponin T High Sensitivity: 120 ng/L (ref 0–19)

## 2024-08-18 MED ORDER — METOPROLOL SUCCINATE ER 25 MG PO TB24
12.5000 mg | ORAL_TABLET | Freq: Every day | ORAL | Status: DC
  Administered 2024-08-18 – 2024-08-23 (×6): 12.5 mg via ORAL
  Filled 2024-08-18 (×7): qty 1

## 2024-08-18 MED ORDER — METHYLPREDNISOLONE SODIUM SUCC 125 MG IJ SOLR
60.0000 mg | INTRAMUSCULAR | Status: DC
  Administered 2024-08-18: 60 mg via INTRAVENOUS
  Filled 2024-08-18: qty 2

## 2024-08-18 MED ORDER — DORZOLAMIDE HCL-TIMOLOL MAL 2-0.5 % OP SOLN
1.0000 [drp] | Freq: Two times a day (BID) | OPHTHALMIC | Status: DC
  Administered 2024-08-20 – 2024-08-23 (×5): 1 [drp] via OPHTHALMIC
  Filled 2024-08-18: qty 10

## 2024-08-18 MED ORDER — BISACODYL 5 MG PO TBEC: 5.0000 mg | DELAYED_RELEASE_TABLET | Freq: Every day | ORAL | Status: DC | PRN

## 2024-08-18 MED ORDER — POLYETHYLENE GLYCOL 3350 17 G PO PACK: 17.0000 g | PACK | Freq: Every day | ORAL | Status: DC | PRN

## 2024-08-18 MED ORDER — IPRATROPIUM-ALBUTEROL 0.5-2.5 (3) MG/3ML IN SOLN
3.0000 mL | Freq: Once | RESPIRATORY_TRACT | Status: AC
  Administered 2024-08-18: 3 mL via RESPIRATORY_TRACT
  Filled 2024-08-18: qty 3

## 2024-08-18 MED ORDER — HYDRALAZINE HCL 25 MG PO TABS
25.0000 mg | ORAL_TABLET | Freq: Three times a day (TID) | ORAL | Status: DC
  Administered 2024-08-19 – 2024-08-24 (×10): 25 mg via ORAL
  Filled 2024-08-18 (×16): qty 1

## 2024-08-18 MED ORDER — ACETAMINOPHEN 325 MG PO TABS: 650.0000 mg | ORAL_TABLET | Freq: Four times a day (QID) | ORAL | Status: DC | PRN

## 2024-08-18 MED ORDER — ONDANSETRON HCL 4 MG/2ML IJ SOLN
4.0000 mg | Freq: Four times a day (QID) | INTRAMUSCULAR | Status: DC | PRN
  Administered 2024-08-20 – 2024-08-22 (×2): 4 mg via INTRAVENOUS
  Filled 2024-08-18 (×2): qty 2

## 2024-08-18 MED ORDER — LEVOTHYROXINE SODIUM 25 MCG PO TABS
125.0000 ug | ORAL_TABLET | Freq: Every day | ORAL | Status: DC
  Administered 2024-08-19 – 2024-08-24 (×2): 125 ug via ORAL
  Filled 2024-08-18 (×6): qty 1

## 2024-08-18 MED ORDER — ONDANSETRON HCL 4 MG PO TABS: 4.0000 mg | ORAL_TABLET | Freq: Four times a day (QID) | ORAL | Status: DC | PRN

## 2024-08-18 MED ORDER — FUROSEMIDE 10 MG/ML IJ SOLN
60.0000 mg | Freq: Once | INTRAMUSCULAR | Status: AC
  Administered 2024-08-18: 60 mg via INTRAVENOUS
  Filled 2024-08-18: qty 6

## 2024-08-18 MED ORDER — NITROFURANTOIN 50 MG/10ML PO SUSP: 100.0000 mg | Freq: Two times a day (BID) | ORAL | Status: DC

## 2024-08-18 MED ORDER — ACETAMINOPHEN 650 MG RE SUPP
650.0000 mg | Freq: Once | RECTAL | Status: DC
  Filled 2024-08-18: qty 1

## 2024-08-18 MED ORDER — HYDRALAZINE HCL 20 MG/ML IJ SOLN
10.0000 mg | Freq: Four times a day (QID) | INTRAMUSCULAR | Status: DC | PRN
  Administered 2024-08-20 – 2024-08-23 (×3): 10 mg via INTRAVENOUS
  Filled 2024-08-18 (×4): qty 1

## 2024-08-18 MED ORDER — ACETAMINOPHEN 650 MG RE SUPP: 650.0000 mg | Freq: Four times a day (QID) | RECTAL | Status: DC | PRN

## 2024-08-18 MED ORDER — FUROSEMIDE 20 MG PO TABS: 20.0000 mg | ORAL_TABLET | Freq: Every day | ORAL | Status: DC

## 2024-08-18 MED ORDER — APIXABAN 2.5 MG PO TABS
2.5000 mg | ORAL_TABLET | Freq: Two times a day (BID) | ORAL | Status: DC
  Administered 2024-08-18 – 2024-08-23 (×11): 2.5 mg via ORAL
  Filled 2024-08-18 (×12): qty 1

## 2024-08-18 MED ORDER — ALBUTEROL SULFATE (2.5 MG/3ML) 0.083% IN NEBU: 2.5000 mg | INHALATION_SOLUTION | RESPIRATORY_TRACT | Status: DC | PRN

## 2024-08-18 MED ORDER — DIGOXIN 125 MCG PO TABS
125.0000 ug | ORAL_TABLET | Freq: Every day | ORAL | Status: DC
  Administered 2024-08-18 – 2024-08-23 (×6): 125 ug via ORAL
  Filled 2024-08-18 (×7): qty 1

## 2024-08-18 NOTE — ED Notes (Signed)
 Attempted to call speech therapy r/t pending consult, no answer

## 2024-08-18 NOTE — ED Notes (Signed)
 Attempted to call speech language. Will try again in a few minutes.

## 2024-08-18 NOTE — ED Triage Notes (Signed)
 Pt bib gcems for shob and cough for 3 days. Hx of CHF. Positive for Covid this morning. Bilateral edema in her legs but states that is normal. Lungs are rhonchi. Axo 4

## 2024-08-18 NOTE — ED Notes (Signed)
 Dr. Dennise says ok to hold PO meds until SLP

## 2024-08-18 NOTE — H&P (Signed)
 "                                                                                                                                                                                                                                                                                                  TRH H&P   Patient Demographics:    Deanna Rivera, is a 89 y.o. female  MRN: [id number]   DOB - 1933-04-16  Admit Date - 08/18/2024  Outpatient Primary MD for the patient is Jodie Lavern CROME, MD  Patient coming from: Independent living/[place], ER  Chief Complaint  Patient presents with   Shortness of Breath      HPI:    Deanna Rivera  is a 89 y.o. female, with history of paroxysmal atrial fibrillation, t second-degree AV block has a pacemaker, Chad vasc 2 score of greater than 3 on Eliquis , PMR, hypothyroidism, hypertension, cataract, ongoing dysphagia, moderate aortic stenosis, moderate MR.  Patient with above history lives at an assisted living facility with her husband who had COVID-19 infection a few days ago, he improved but she started feeling worse for the last 2 to 3 days with generalized weakness, poor appetite and shortness of breath on exertion and laying flat.  She has also noticed lower extremity edema progressively worse over the last several weeks.  In the ER she was diagnosed with COVID-19 infection along with acute on chronic diastolic CHF causing hypoxic respiratory failure and I was requested to admit the patient.  Patient currently denies any fever or chills, no headache, no chest pain, does have a wet cough, shortness of breath on laying flat and exertion, no abdominal pain, no diarrhea or dysuria, no focal weakness, she does have generalized weakness and is wheelchair-bound at baseline, also having problems swallowing food or liquids for last several weeks and was scheduled for a swallow evaluation in the outpatient setting in the next few days.    Review of systems:     A full  10 point Review of Systems was done, except as stated above, all other Review of Systems were negative.  With Past History of the following :    Past Medical History:  Diagnosis Date   Cataract    Hypertension    Moderate aortic stenosis    Moderate mitral regurgitation    Persistent atrial fibrillation (HCC)    PMR (polymyalgia rheumatica)    Second degree AV block    Sick sinus syndrome (HCC)    Thyroid  disease       Past Surgical History:  Procedure Laterality Date   ABDOMINAL HYSTERECTOMY     APPENDECTOMY     CESAREAN SECTION     x 3   CHOLECYSTECTOMY     PACEMAKER GENERATOR CHANGE  11/20/2018   [employer] Accolade MRI PPM implanted in [place]    PACEMAKER IMPLANT     PILONIDAL CYST EXCISION        Social History:     Social History   Tobacco Use   Smoking status: Former    Types: Cigarettes    Passive exposure: Past   Smokeless tobacco: Never  Substance Use Topics   Alcohol use: Not Currently       Family History :     Family History  Problem Relation Age of Onset   Early death Mother 47   Rheumatic fever Mother    Heart disease Mother        valve replacement   Cirrhosis Father 40   Hypertension Father    Hypertension Brother    Lung cancer Brother    Hypertension Brother    Other Brother        smoker   Scleroderma Daughter    Hypertension Son    Hypertension Son      Home Medications:   Prior to Admission medications  Medication Sig Start Date End Date Taking? Authorizing Provider  apixaban  (ELIQUIS ) 2.5 MG TABS tablet TAKE 1 TABLET BY MOUTH TWICE  DAILY 06/08/24  Yes Thukkani, Arun K, MD  digoxin  (LANOXIN ) 0.125 MG tablet TAKE 1 TABLET BY MOUTH DAILY 04/28/24  Yes Waddell Danelle ORN, MD  latanoprost  (XALATAN ) 0.005 % ophthalmic solution Place 1 drop into both eyes at bedtime. 03/13/21  Yes [provider]  levothyroxine  (SYNTHROID ) 125 MCG tablet TAKE 1 TABLET BY MOUTH DAILY 07/15/24  Yes Jodie Lavern CROME, MD   acetaminophen  (TYLENOL ) 500 MG tablet Take 500 mg by mouth every 6 (six) hours as needed for mild pain or headache. Patient not taking: No sig reported    [provider]  dorzolamide -timolol  (COSOPT ) 22.3-6.8 MG/ML ophthalmic solution Place 1 drop into both eyes 2 (two) times daily. 03/30/21   [provider]  furosemide  (LASIX ) 20 MG tablet Take 1 tablet (20 mg total) by mouth daily. 06/11/24   Lonni Slain, MD  metoprolol  succinate (TOPROL -XL) 25 MG 24 hr tablet TAKE ONE-HALF TABLET BY MOUTH  DAILY 08/14/24   Jodie Lavern CROME, MD  Nitrofurantoin  50 MG/10ML SUSP Take 100 mg by mouth 2 (two) times daily. 08/07/24   Webb, Padonda B, FNP  polyethylene glycol (MIRALAX  / GLYCOLAX ) 17 g packet Take 17 g by mouth daily as needed for severe constipation. 11/07/22   Bryn Bernardino NOVAK, MD  predniSONE  (DELTASONE ) 1 MG tablet TAKE 1 TABLET BY MOUTH DAILY 08/15/24   Webb, Padonda B, FNP     Allergies:    Allergies[1]   Physical Exam:   Vitals  Blood pressure (!) 172/77, pulse 77, temperature 98.5 F (36.9 C), temperature source Oral, resp. rate (!) 22, height 5' 4 (1.626 m), weight 52.2 kg, SpO2  94%.   1. General Frail elderly white female lying in hospital bed appearing quite weak,  2. Normal affect and insight, Not Suicidal or Homicidal, Awake Alert,   3. No F.N deficits, ALL C.Nerves Intact, Strength 5/5 all 4 extremities, Sensation intact all 4 extremities, Plantars down going.  4. Ears and Eyes appear Normal, Conjunctivae clear, PERRLA. Moist Oral Mucosa.  5. Supple Neck, No JVD, No cervical lymphadenopathy appriciated, No Carotid Bruits.  Bilateral prominently clavicles on the medial aspect right more than left.  6. Symmetrical Chest wall movement, Good air movement bilaterally, coarse bilateral breath sounds with diffuse crackles bilaterally,  7. RRR, No Gallops, Rubs or Murmurs, No Parasternal Heave.  8. Positive Bowel Sounds, Abdomen Soft, No tenderness, No  organomegaly appriciated,No rebound -guarding or rigidity.  9.  No Cyanosis, Normal Skin Turgor,  2+ lower extremity edema with prominent venous engorgement  10. Good muscle tone,  joints appear normal , no effusions, Normal ROM.  11. No Palpable Lymph Nodes in Neck or Axillae      Data Review:   Recent Labs  Lab 08/18/24 1312  WBC 12.3*  HGB 14.6  HCT 43.0  PLT 195  MCV 96.6  MCH 32.8  MCHC 34.0  RDW 13.4    Recent Labs  Lab 08/18/24 1312 08/18/24 1325  NA 135  --   K 4.0  --   CL 95*  --   CO2 31  --   ANIONGAP 9  --   GLUCOSE 127*  --   BUN 20  --   CREATININE 0.39*  --   LATICACIDVEN  --  1.8  CALCIUM 10.7*  --     Lab Results  Component Value Date   CHOL 190 03/25/2024   HDL 66.10 03/25/2024   LDLCALC 109 (H) 03/25/2024   TRIG 72.0 03/25/2024   CHOLHDL 3 03/25/2024    Recent Labs  Lab 08/18/24 1312 08/18/24 1325  LATICACIDVEN  --  1.8  CALCIUM 10.7*  --     Recent Labs  Lab 08/18/24 1312 08/18/24 1325  WBC 12.3*  --   PLT 195  --   LATICACIDVEN  --  1.8  CREATININE 0.39*  --     Urinalysis    Component Value Date/Time   COLORURINE YELLOW 12/24/2023 1219   APPEARANCEUR Cloudy (A) 12/24/2023 1219   LABSPEC 1.010 12/24/2023 1219   PHURINE 7.0 12/24/2023 1219   GLUCOSEU NEGATIVE 12/24/2023 1219   HGBUR MODERATE (A) 12/24/2023 1219   BILIRUBINUR negative 02/26/2024 1158   KETONESUR NEGATIVE 12/24/2023 1219   PROTEINUR Negative 02/26/2024 1158   PROTEINUR NEGATIVE 08/13/2023 1415   UROBILINOGEN negative (A) 02/26/2024 1158   UROBILINOGEN 1.0 12/24/2023 1219   NITRITE negative 02/26/2024 1158   NITRITE POSITIVE (A) 12/24/2023 1219   LEUKOCYTESUR Trace (A) 02/26/2024 1158   LEUKOCYTESUR SMALL (A) 12/24/2023 1219      Imaging Results:    DG Chest 2 View Result Date: 08/18/2024 CLINICAL DATA:  Shortness of breath and cough EXAM: DG CHEST 2V COMPARISON:  CT scan of the chest 08/13/2023 FINDINGS: Similar degree of cardiomegaly. Left  subclavian approach cardiac rhythm maintenance device. Leads project over the right atrium and right ventricle. No significant vascular congestion or overt pulmonary edema. Nonspecific patchy left basilar airspace opacity favored to reflect atelectasis. No acute osseous abnormality. IMPRESSION: 1. Left retrocardiac patchy airspace opacity may reflect atelectasis or infiltrate. Atelectasis is favored. 2. Stable cardiomegaly without evidence of acute pulmonary edema. 3. Unchanged position of left  subclavian approach cardiac rhythm maintenance device. Electronically Signed   By: Wilkie Lent M.D.   On: 08/18/2024 12:59    My personal review of EKG: Rhythm NSR, Rate  82/min, nonspecific ST changes   Assessment & Plan:    1.  Acute hypoxic respiratory failure, orthopnea, exertional shortness of breath in elderly patient due to acute on chronic diastolic CHF and RNCPI-80 infection.  Patient will be admitted to the hospital, telemetry, IV Lasix , fluid restriction, daily weights, monitor intake and output, dose Lasix  daily, continue home dose beta-blocker, check inflammatory markers including CBC, procalcitonin, for now IV Solu-Medrol , encouraged to sit in chair use I-S and flutter valve for pulmonary toiletry.  Advance activity and titrate down oxygen, check inflammatory markers including CRP and procalcitonin.  Recent echo noted with EF of 60%.  2.  Underlying moderate MR, moderate to severe aortic valve stenosis .  Supportive care.  3.  Dysphagia.  Ongoing for several weeks, dysphagia 3 diet, speech eval.  4.  Hypothyroidism.  Home dose Synthroid .  5.  Proximal atrial fibrillation.  Chad vas 2 score of greater than 3.  Home digoxin  and-dose beta-blocker will be continued along with home Eliquis .  6.  Generalized weakness.  Wheelchair-bound.  PT OT may require SNF.  7.  Mildly elevated troponin.  No chest pain, due to demand mismatch from #1 above.  Supportive care.  Continue Eliquis  and  beta-blocker.  8.  History of polymyalgia rheumatica.  On chronic steroids.  Currently on Solu-Medrol .  9.  History of second-degree AV block.  Has pacemaker.   DVT Prophylaxis Eliquis   AM Labs Ordered, also please review Full Orders  Family Communication: Admission, patients condition and plan of care including tests being ordered have been discussed with the patient and son who indicate understanding and agree with the plan and Code Status.  Code Status DNR  Likely DC to to be decided may require SNF  Condition GUARDED    Consults called: None    Admission status:   Inpt  Time spent in minutes : 40  Signature  -    Lavada Stank M.D on 08/18/2024 at 2:40 PM   -  To page go to [email or web address]            [1]  Allergies Allergen Reactions   Shellfish Protein-Containing Drug Products Shortness Of Breath and Other (See Comments)    Lips go numb   Epinephrine  Other (See Comments) and Hypertension    Makes me feel wired   Monosodium Glutamate Other (See Comments)    other   Monosodium Glutamate Hypertension and Other (See Comments)    Makes me feel badly  Other Reaction(s): Other (See Comments)  other, other   Ciprofloxacin Other (See Comments)    Allergic reaction not recalled  Other Reaction(s): Other (See Comments)  Other reaction(s): Intolerance   Dronedarone     Other Reaction(s): Other (See Comments)  Leg swelling, Leg swelling   Erythromycin Other (See Comments)    Allergic reaction not recalled   Sulfa Antibiotics Other (See Comments)    Allergic reaction not recalled   Sulfamethoxazole-Trimethoprim    Tetracyclines & Related Other (See Comments)    Allergic reaction not recalled   "

## 2024-08-18 NOTE — ED Provider Triage Note (Signed)
 Emergency Medicine Provider Triage Evaluation Note  Mckenzie Bove , a 89 y.o. female  was evaluated in triage.  Pt complains of congestion, cough, fevers, chills, nausea, shortness of breath, worsened edema and COVID-positive earlier.  Review of Systems  Positive: Positive COVID test today, fatigue, shortness of breath, cough, worsening peripheral edema, malaise, chills, nausea Negative: Chest pain, palpitations, constipation, urinary changes  Physical Exam  BP (!) 172/77 (BP Location: Left Arm)   Pulse 77   Temp 98.5 F (36.9 C) (Oral)   Resp (!) 22   Ht 5' 4 (1.626 m)   Wt 52.2 kg   SpO2 94%   BMI 19.75 kg/m  Gen:   Awake, no distress   Resp:  Normal effort, increased work of breathing with cough with rhonchi MSK:   Moves extremities without difficulty, worsened peripheral edema in both legs Other:  Ill-appearing  Medical Decision Making  Medically screening exam initiated at 12:17 PM.  Appropriate orders placed.  Kinga Bishara was informed that the remainder of the evaluation will be completed by another provider, this initial triage assessment does not replace that evaluation, and the importance of remaining in the ED until their evaluation is complete.  Kateline Brotherton is a 89 y.o. female with a past medical history significant for hypertension, atrial fibrillation on Eliquis  therapy, sick sinus syndrome with pacemaker, previous cholecystectomy, aortic stenosis, and heart failure who was diagnosed with COVID today who presents with worsening shortness of breath, fatigue, chills, nausea, malaise, and worsening peripheral edema.  Patient brought in on oxygen as she reportedly was hypoxic and she does not take oxygen normally.  Patient reports that for the last 2 days she has had the symptoms that have all been worsening.  On my exam, she is extremely coarse breath sounds and is breathing quickly.  Oxygen saturations are 94% on 2 L which she normally does not use.  Chest was  nontender and abdomen was nontender.  She has very edematous legs bilaterally she reports is worse than baseline.  Back nontender.  Moving all extremities.  Given her recent COVID-positive test I suspect this is all related COVID however with her coarse breath sounds and hypoxia and increased work of breathing will make sure does not have pneumonia as well.  Will get screening labs to look for heart failure exacerbation as well.  Will get other cardiac workup with a troponin.  EKG does not show STEMI at this time.  Anticipate due to the new oxygen requirement, age, and increased work of breathing she will need admission for further management of hypoxia with COVID and heart failure.  Anticipate evaluation and admission when she is seen by another provider in exam room.          Querida Beretta, Lonni PARAS, MD 08/18/24 [phone]

## 2024-08-18 NOTE — ED Notes (Addendum)
 Pt able to eat applesauce without choking. Clearing throat after swallowing, states this is her baseline.

## 2024-08-18 NOTE — Telephone Encounter (Signed)
 FYI Only or Action Required?: FYI only for provider: ED advised.  Patient was last seen in primary care on 05/28/2024 by Jodie Lavern CROME, MD.  Called Nurse Triage reporting Covid Positive.  Symptoms began several days ago.  Interventions attempted: Other: unknown .  Symptoms are: gradually worsening.  Triage Disposition: Go to ED Now (or PCP Triage)  Patient/caregiver understands and will follow disposition?: Yes               Reason for Disposition  Patient sounds very sick or weak to the triager    Due to weakness  Answer Assessment - Initial Assessment Questions Patient reports COVID positive this morning symptoms started few days ago. Very weak usually can stand  to transfer out of power chair is able to do this but not long. Heart rate is 88 usually 98. Has pacemaker and has fibrillation. Headache yesterday when blood pressure was 170. Blood pressure this morning has not checked. No headache. Not able to eat yesterday but ate this morning. Urinated this morning. Swollen feet . Audible continuous coughing on line.  Patient gave permission to speak with caretaker Tiny, who spoke with spouse who advised ER and agreeable to call 911 and will update patients son. Caretaker is going to call  [phone].      1. SYMPTOMS: What is your main symptom or concern? (e.g., cough, fever, shortness of breath, muscle aches)     Wet sounding cough  2. ONSET: When did the symptoms start?      Few days ago  3. COUGH: Do you have a cough? If Yes, ask: How bad is the cough?       Yes started few days ago getting worse  4. FEVER: Do you have a fever? If Yes, ask: What is your temperature, how was it measured, and when did it start?     Did not report  5. BREATHING DIFFICULTY: Are you having any difficulty breathing? (e.g., normal; shortness of breath, wheezing, unable to speak)      Patient denies difficulty breathing  6. BETTER-SAME-WORSE: Are you getting better, staying the  same or getting worse compared to yesterday?  If getting worse, ask, In what way?     Worse  7. OTHER SYMPTOMS: Do you have any other symptoms?  (e.g., chills, fatigue, headache, loss of smell or taste, muscle pain, sore throat)     Very weak , elevated blood pressure , heart rate concerns has afib 8. COVID-19 DIAGNOSIS: How do you know that you have COVID? (e.g., positive lab test or self-test, diagnosed by doctor or NP/PA, symptoms after exposure).     Home test  9. COVID-19 EXPOSURE: Was there any known exposure to COVID before the symptoms began?      Unknown   11. HIGH RISK DISEASE: Do you have any chronic medical problems? (e.g., asthma, heart or lung disease, weak immune system, obesity, etc.)       Afib, age   26. O2 SATURATION MONITOR:  Do you use an oxygen saturation monitor (pulse oximeter) at home? If Yes, ask What is your reading (oxygen level) today? What is your usual oxygen saturation reading? (e.g., 95%)       Not able to assess  Protocols used: COVID-19 - Diagnosed or Suspected-A-AH Copied from CRM #[id number]. Topic: Clinical - Medical Advice >> Aug 18, 2024 10:22 AM Montie POUR wrote: Reason for CRM:  Raeann tested positive with COVID this morning at home. She is coughing, can't set up, unknown about  fever, she has A-Fib, Pacemaker.

## 2024-08-18 NOTE — ED Provider Notes (Signed)
 "  EMERGENCY DEPARTMENT AT [facility] Provider Note   CSN: [id number] Arrival date & time: 08/18/24  1151     Patient presents with: Shortness of Breath   Deanna Rivera is a 89 y.o. female with history of congestive heart failure, on Eliquis , sick sinus syndrome with pacemaker, presents to the emergency department today for evaluation of shortness of breath and nonproductive cough for the past 3 days.  History of congestive heart failure.  Reports some chills and bodyaches but no recorded temperature.  Positive for COVID this morning.  Reports that she has some bilateral lower leg edema but is normal.  Did not take any of her normal medications this morning.  She denies any chest pain, abdominal pain, or dysuria.  Reports more generalized fatigue with runny nose and nasal congestion as well.  Was told that her oxygen was in the mid 80s and was told to come into the ER.   Shortness of Breath Associated symptoms: cough   Associated symptoms: no abdominal pain, no chest pain, no fever, no headaches, no sore throat and no vomiting        Prior to Admission medications  Medication Sig Start Date End Date Taking? Authorizing Provider  apixaban  (ELIQUIS ) 2.5 MG TABS tablet TAKE 1 TABLET BY MOUTH TWICE  DAILY 06/08/24  Yes Thukkani, Arun K, MD  digoxin  (LANOXIN ) 0.125 MG tablet TAKE 1 TABLET BY MOUTH DAILY 04/28/24  Yes Waddell Danelle ORN, MD  dorzolamide -timolol  (COSOPT ) 22.3-6.8 MG/ML ophthalmic solution Place 1 drop into both eyes 2 (two) times daily. 03/30/21  Yes [provider]  furosemide  (LASIX ) 20 MG tablet Take 1 tablet (20 mg total) by mouth daily. 06/11/24  Yes Lonni Slain, MD  latanoprost  (XALATAN ) 0.005 % ophthalmic solution Place 1 drop into both eyes at bedtime. 03/13/21  Yes [provider]  levothyroxine  (SYNTHROID ) 125 MCG tablet TAKE 1 TABLET BY MOUTH DAILY 07/15/24  Yes Jodie Lavern CROME, MD  metoprolol  succinate (TOPROL -XL) 25 MG 24 hr  tablet TAKE ONE-HALF TABLET BY MOUTH  DAILY 08/14/24  Yes Jodie Lavern CROME, MD  polyethylene glycol (MIRALAX  / GLYCOLAX ) 17 g packet Take 17 g by mouth daily as needed for severe constipation. 11/07/22  Yes Bryn Bernardino NOVAK, MD  predniSONE  (DELTASONE ) 1 MG tablet TAKE 1 TABLET BY MOUTH DAILY 08/15/24  Yes Webb, Padonda B, FNP  acetaminophen  (TYLENOL ) 500 MG tablet Take 500 mg by mouth every 6 (six) hours as needed for mild pain or headache. Patient not taking: No sig reported    [provider]  Nitrofurantoin  50 MG/10ML SUSP Take 100 mg by mouth 2 (two) times daily. 08/07/24   Webb, Padonda B, FNP    Allergies: Shellfish protein-containing drug products, Epinephrine , Monosodium glutamate, Ciprofloxacin, Dronedarone, Erythromycin, Sulfa antibiotics, Sulfamethoxazole-trimethoprim, and Tetracyclines & related    Review of Systems  Constitutional:  Positive for chills. Negative for fever.  HENT:  Positive for congestion and rhinorrhea. Negative for sore throat.   Respiratory:  Positive for cough and shortness of breath.   Cardiovascular:  Negative for chest pain.  Gastrointestinal:  Negative for abdominal pain and vomiting.  Genitourinary:  Negative for dysuria.  Musculoskeletal:  Positive for myalgias.  Neurological:  Negative for headaches.    Updated Vital Signs BP (!) 172/77 (BP Location: Left Arm)   Pulse 77   Temp 98.5 F (36.9 C) (Oral)   Resp (!) 22   Ht 5' 4 (1.626 m)   Wt 52.2 kg   SpO2  94%   BMI 19.75 kg/m   Physical Exam Constitutional:      Appearance: She is not toxic-appearing.     Comments: Chronically ill-appearing, cachectic  Cardiovascular:     Rate and Rhythm: Normal rate.  Pulmonary:     Effort: Pulmonary effort is normal.     Breath sounds: Rhonchi and rales present.     Comments: Coarse rhonchi/rales in the bilateral lower bases.  Does have a more wet cough.  Speaks in full sentences.  On 2 L nasal cannula. Abdominal:     Palpations: Abdomen is soft.      Tenderness: There is no abdominal tenderness.  Musculoskeletal:     Right lower leg: Edema present.     Left lower leg: Edema present.  Skin:    General: Skin is warm and dry.  Neurological:     Mental Status: She is alert.     (all labs ordered are listed, but only abnormal results are displayed) Labs Reviewed  RESP PANEL BY RT-PCR (RSV, FLU A&B, COVID)  RVPGX2 - Abnormal; Notable for the following components:      Result Value   SARS Coronavirus 2 by RT PCR POSITIVE (*)    All other components within normal limits  BASIC METABOLIC PANEL WITH GFR - Abnormal; Notable for the following components:   Chloride 95 (*)    Glucose, Bld 127 (*)    Creatinine, Ser 0.39 (*)    Calcium 10.7 (*)    All other components within normal limits  CBC - Abnormal; Notable for the following components:   WBC 12.3 (*)    All other components within normal limits  PRO BRAIN NATRIURETIC PEPTIDE - Abnormal; Notable for the following components:   Pro Brain Natriuretic Peptide 2,287.0 (*)    All other components within normal limits  TROPONIN T, HIGH SENSITIVITY - Abnormal; Notable for the following components:   Troponin T High Sensitivity 118 (*)    All other components within normal limits  C-REACTIVE PROTEIN  I-STAT CG4 LACTIC ACID, ED  I-STAT CG4 LACTIC ACID, ED  TROPONIN T, HIGH SENSITIVITY    EKG: EKG Interpretation Date/Time:  Tuesday August 18 2024 12:08:27 EST Ventricular Rate:  82 PR Interval:    QRS Duration:  90 QT Interval:  350 QTC Calculation: 408 R Axis:   2  Text Interpretation: Atrial fibrillation Anterior infarct , age undetermined ST & T wave abnormality, consider inferolateral ischemia Abnormal ECG When compared with ECG of 11-Jun-2024 13:33, PREVIOUS ECG IS PRESENT when compared top rior, similar afib with less PVC No STEMI Confirmed by Ginger Barefoot ([id number]) on 08/18/2024 12:20:12 PM  Radiology: DG Chest 2 View Result Date: 08/18/2024 CLINICAL DATA:  Shortness of  breath and cough EXAM: DG CHEST 2V COMPARISON:  CT scan of the chest 08/13/2023 FINDINGS: Similar degree of cardiomegaly. Left subclavian approach cardiac rhythm maintenance device. Leads project over the right atrium and right ventricle. No significant vascular congestion or overt pulmonary edema. Nonspecific patchy left basilar airspace opacity favored to reflect atelectasis. No acute osseous abnormality. IMPRESSION: 1. Left retrocardiac patchy airspace opacity may reflect atelectasis or infiltrate. Atelectasis is favored. 2. Stable cardiomegaly without evidence of acute pulmonary edema. 3. Unchanged position of left subclavian approach cardiac rhythm maintenance device. Electronically Signed   By: Wilkie Lent M.D.   On: 08/18/2024 12:59   Procedures   Medications Ordered in the ED  metoprolol  succinate (TOPROL -XL) 24 hr tablet 12.5 mg (has no administration in time range)  polyethylene glycol (MIRALAX  / GLYCOLAX ) packet 17 g (has no administration in time range)  dorzolamide -timolol  (COSOPT ) 2-0.5 % ophthalmic solution 1 drop (has no administration in time range)  Nitrofurantoin  SUSP 100 mg (has no administration in time range)  ipratropium-albuterol  (DUONEB) 0.5-2.5 (3) MG/3ML nebulizer solution 3 mL (has no administration in time range)  albuterol  (PROVENTIL ) (2.5 MG/3ML) 0.083% nebulizer solution 2.5 mg (has no administration in time range)  methylPREDNISolone  sodium succinate (SOLU-MEDROL ) 125 mg/2 mL injection 60 mg (has no administration in time range)  levothyroxine  (SYNTHROID ) tablet 125 mcg (has no administration in time range)  furosemide  (LASIX ) injection 60 mg (has no administration in time range)  acetaminophen  (TYLENOL ) tablet 650 mg (has no administration in time range)    Or  acetaminophen  (TYLENOL ) suppository 650 mg (has no administration in time range)  bisacodyl  (DULCOLAX) EC tablet 5 mg (has no administration in time range)  ondansetron  (ZOFRAN ) tablet 4 mg (has no  administration in time range)    Or  ondansetron  (ZOFRAN ) injection 4 mg (has no administration in time range)  apixaban  (ELIQUIS ) tablet 2.5 mg (has no administration in time range)  digoxin  (LANOXIN ) tablet 125 mcg (has no administration in time range)  acetaminophen  (TYLENOL ) suppository 650 mg (650 mg Rectal Given 08/18/24 1429)    Medical Decision Making Amount and/or Complexity of Data Reviewed Labs: ordered. Radiology: ordered.  Risk OTC drugs. Decision regarding hospitalization.   89 y.o. female presents to the ER for evaluation of cough SOB. Differential diagnosis includes but is not limited to Upper respiratory infection, lower respiratory infection, allergies/irritants, asthma, reflux, CHF, interstitial lung disease, foreign body, ACE inhibitors. Vital signs hypertensive, satting 94% on 2 L nasal cannula.. Physical exam as noted above.   Workup initiated in triage.   I independently reviewed and interpreted the patient's labs.  I-STAT lactic within normal limits.  CBC shows slight leukocytosis of 12.3.  BMP shows chloride of 95, glucose at 127 with creatinine of 0.39.  Calcium mildly elevated at 10.7.  Patient's proBNP is elevated at 2287.  No previous compare this to.  Troponin elevated at 118, delta pending.  COVID-positive.  EKG reviewed and interpreted by my attending and read as Atrial fibrillation Anterior infarct , age undetermined ST & T wave abnormality, consider inferolateral ischemia Abnormal ECG When compared with ECG of 11-Jun-2024 13:33, PREVIOUS ECG IS PRESENT when compared top rior, similar afib with less PVC No STEMI.   CXR shows 1. Left retrocardiac patchy airspace opacity may reflect atelectasis or infiltrate. Atelectasis is favored. 2. Stable cardiomegaly without evidence of acute pulmonary edema. 3. Unchanged position of left subclavian approach cardiac rhythm maintenance device. Per radiologist's interpretation.    Patient usually does not require any  additional oxygen.  She reports that she is feeling better with the oxygen.  Chest x-ray shows atelectasis versus infiltrate however patient does have COVID.  Will leave this up for hospitalist decision for treatment versus watching and waiting.  Given the new need for oxygen, she will need to be admitted for further treatment and care.  Patient and family agreeable to the plan.  Admit to [facility] hospitalist.  Portions of this report may have been transcribed using voice recognition software. Every effort was made to ensure accuracy; however, inadvertent computerized transcription errors may be present.    Final diagnoses:  COVID  Acute hypoxic respiratory failure [facility])    ED Discharge Orders     None          Bernis Ernst,  PA-C 08/18/24 1534  "

## 2024-08-18 NOTE — ED Notes (Signed)
 Per attending Dennise ,MD, try PO meds with apple sauce, if unable to tolerate hold till Speech can evaluate pt.

## 2024-08-18 NOTE — ED Notes (Signed)
 Md singh aware of trop level. No repeat trop needed per MD

## 2024-08-18 NOTE — Telephone Encounter (Signed)
 Noted

## 2024-08-19 LAB — CBC WITH DIFFERENTIAL/PLATELET
Abs Immature Granulocytes: 0.04 K/uL (ref 0.00–0.07)
Basophils Absolute: 0 K/uL (ref 0.0–0.1)
Basophils Relative: 0 %
Eosinophils Absolute: 0 K/uL (ref 0.0–0.5)
Eosinophils Relative: 0 %
HCT: 44.9 % (ref 36.0–46.0)
Hemoglobin: 15.1 g/dL — ABNORMAL HIGH (ref 12.0–15.0)
Immature Granulocytes: 0 %
Lymphocytes Relative: 14 %
Lymphs Abs: 1.4 K/uL (ref 0.7–4.0)
MCH: 32.5 pg (ref 26.0–34.0)
MCHC: 33.6 g/dL (ref 30.0–36.0)
MCV: 96.8 fL (ref 80.0–100.0)
Monocytes Absolute: 0.2 K/uL (ref 0.1–1.0)
Monocytes Relative: 2 %
Neutro Abs: 8.5 K/uL — ABNORMAL HIGH (ref 1.7–7.7)
Neutrophils Relative %: 84 %
Platelets: 193 K/uL (ref 150–400)
RBC: 4.64 MIL/uL (ref 3.87–5.11)
RDW: 13.5 % (ref 11.5–15.5)
WBC: 10.1 K/uL (ref 4.0–10.5)
nRBC: 0 % (ref 0.0–0.2)

## 2024-08-19 LAB — BASIC METABOLIC PANEL WITH GFR
Anion gap: 10 (ref 5–15)
BUN: 24 mg/dL — ABNORMAL HIGH (ref 8–23)
CO2: 30 mmol/L (ref 22–32)
Calcium: 10.4 mg/dL — ABNORMAL HIGH (ref 8.9–10.3)
Chloride: 98 mmol/L (ref 98–111)
Creatinine, Ser: 0.37 mg/dL — ABNORMAL LOW (ref 0.44–1.00)
GFR, Estimated: >60 mL/min
Glucose, Bld: 118 mg/dL — ABNORMAL HIGH (ref 70–99)
Potassium: 4.3 mmol/L (ref 3.5–5.1)
Sodium: 138 mmol/L (ref 135–145)

## 2024-08-19 LAB — MAGNESIUM: Magnesium: 2.1 mg/dL (ref 1.7–2.4)

## 2024-08-19 LAB — PROCALCITONIN: Procalcitonin: 0.44 ng/mL

## 2024-08-19 LAB — C-REACTIVE PROTEIN: CRP: 17.9 mg/dL — ABNORMAL HIGH

## 2024-08-19 MED ORDER — GUAIFENESIN ER 600 MG PO TB12
1200.0000 mg | ORAL_TABLET | Freq: Two times a day (BID) | ORAL | Status: DC
  Administered 2024-08-19 – 2024-08-23 (×2): 1200 mg via ORAL
  Filled 2024-08-19 (×10): qty 2

## 2024-08-19 MED ORDER — REVEFENACIN 175 MCG/3ML IN SOLN
175.0000 ug | Freq: Every day | RESPIRATORY_TRACT | Status: DC
  Administered 2024-08-20 – 2024-08-21 (×2): 175 ug via RESPIRATORY_TRACT
  Filled 2024-08-19 (×3): qty 3

## 2024-08-19 MED ORDER — IPRATROPIUM-ALBUTEROL 0.5-2.5 (3) MG/3ML IN SOLN: 3.0000 mL | RESPIRATORY_TRACT | Status: DC | PRN

## 2024-08-19 MED ORDER — METHYLPREDNISOLONE SODIUM SUCC 40 MG IJ SOLR
40.0000 mg | INTRAMUSCULAR | Status: DC
  Administered 2024-08-19: 40 mg via INTRAVENOUS
  Filled 2024-08-19: qty 1

## 2024-08-19 MED ORDER — LATANOPROST 0.005 % OP SOLN
1.0000 [drp] | Freq: Every day | OPHTHALMIC | Status: DC
  Administered 2024-08-23: 1 [drp] via OPHTHALMIC
  Filled 2024-08-19: qty 2.5

## 2024-08-19 MED ORDER — OLANZAPINE 5 MG PO TABS
2.5000 mg | ORAL_TABLET | Freq: Three times a day (TID) | ORAL | Status: DC
  Administered 2024-08-20 – 2024-08-21 (×3): 2.5 mg via ORAL
  Filled 2024-08-19 (×12): qty 1

## 2024-08-19 MED ORDER — METOPROLOL TARTRATE 5 MG/5ML IV SOLN
5.0000 mg | INTRAVENOUS | Status: DC | PRN
  Administered 2024-08-20: 3 mg via INTRAVENOUS
  Administered 2024-08-24: 5 mg via INTRAVENOUS
  Filled 2024-08-19 (×2): qty 5

## 2024-08-19 MED ORDER — GLUCAGON HCL RDNA (DIAGNOSTIC) 1 MG IJ SOLR: 1.0000 mg | INTRAMUSCULAR | Status: DC | PRN

## 2024-08-19 MED ORDER — ARFORMOTEROL TARTRATE 15 MCG/2ML IN NEBU
15.0000 ug | INHALATION_SOLUTION | Freq: Two times a day (BID) | RESPIRATORY_TRACT | Status: DC
  Administered 2024-08-19 – 2024-08-21 (×5): 15 ug via RESPIRATORY_TRACT
  Filled 2024-08-19 (×6): qty 2

## 2024-08-19 MED ORDER — GUAIFENESIN 100 MG/5ML PO LIQD
5.0000 mL | ORAL | Status: DC | PRN
  Administered 2024-08-20 – 2024-08-21 (×2): 5 mL via ORAL
  Filled 2024-08-19 (×4): qty 5

## 2024-08-19 NOTE — Evaluation (Signed)
 Physical Therapy Evaluation Patient Details Name: Deanna Rivera MRN: [id number] DOB: [birth year] Today's Date: 08/19/2024  History of Present Illness  Pt is 89 year old presented to [facility] on  [facility]/18/24 for weakness and SOB. Pt with covid and acute CHF.  PMH - heart block with pacer, permanent atrial fibrillation on Eliquis , hypothyroidism, inclusion body myositis, HTN, and recent influenza A infection  Clinical Impression  Pt admitted with above diagnosis and presents to PT with functional limitations due to deficits listed below (See PT problem list). Pt needs skilled PT to maximize independence and safety. Pt from [facility] with husband. Unsure if at Independent Living or Assisted Living. Pt w/c bound at baseline and uses power chair. Today pt weak and cognition swinging between fairly appropriate conversation to paranoid adversarial conversation. Patient will benefit from continued inpatient follow up therapy, <3 hours/day unless she progress rapidly back to baseline physically and cognitively.           If plan is discharge home, recommend the following: A lot of help with walking and/or transfers;A lot of help with bathing/dressing/bathroom   Can travel by private vehicle   No    Equipment Recommendations None recommended by PT  Recommendations for Other Services       Functional Status Assessment Patient has had a recent decline in their functional status and demonstrates the ability to make significant improvements in function in a reasonable and predictable amount of time.     Precautions / Restrictions Precautions Precautions: Fall Recall of Precautions/Restrictions: Impaired Restrictions Weight Bearing Restrictions Per Provider Order: No      Mobility  Bed Mobility Overal bed mobility: Needs Assistance Bed Mobility: Supine to Sit, Sit to Supine     Supine to sit: Mod assist, HOB elevated Sit to supine: Min assist   General bed mobility comments: Assist to elevate  trunk into sitting. assist to bring  legs back up into the bed.    Transfers                   General transfer comment: did not attempt from high stretcher with 1 person assist.    Ambulation/Gait                  Stairs            Wheelchair Mobility     Tilt Bed    Modified Rankin (Stroke Patients Only)       Balance Overall balance assessment: Needs assistance Sitting-balance support: No upper extremity supported, Feet unsupported Sitting balance-Leahy Scale: Fair                                       Pertinent Vitals/Pain Pain Assessment Pain Assessment: No/denies pain    Home Living Family/patient expects to be discharged to:: Other (Comment)                   Additional Comments: Pt at [facility]. Unsure if at independent level or assisted level. Has aide 3hrs/day.    Prior Function Prior Level of Function : Needs assist             Mobility Comments: Pt uses electric w/c and reports she can perform transfers modified independently       Extremity/Trunk Assessment   Upper Extremity Assessment Upper Extremity Assessment: Generalized weakness    Lower Extremity Assessment Lower Extremity Assessment: Generalized weakness  Communication   Communication Communication: Impaired Factors Affecting Communication: Hearing impaired    Cognition Arousal: Alert Behavior During Therapy: Anxious, Restless   PT - Cognitive impairments: No family/caregiver present to determine baseline, Memory, Attention, Problem solving, Safety/Judgement                       PT - Cognition Comments: At times pt would converse appropriately and answer questions appropriately. Then she started asking why the people were marching past her door. I explained she was in the ED and that there were a lot of people moving from place to place. She became irritated and said that is not it and that people were moving in  lockstep and looking in her room. She then stated I thought I could trust you. Following commands: Impaired Following commands impaired: Follows one step commands inconsistently, Follows one step commands with increased time     Cueing Cueing Techniques: Verbal cues, Tactile cues     General Comments General comments (skin integrity, edema, etc.): HR 100's to 120's    Exercises     Assessment/Plan    PT Assessment Patient needs continued PT services  PT Problem List Decreased strength;Decreased activity tolerance;Decreased balance;Decreased mobility;Decreased cognition       PT Treatment Interventions DME instruction;Functional mobility training;Therapeutic activities;Therapeutic exercise;Balance training;Patient/family education    PT Goals (Current goals can be found in the Care Plan section)  Acute Rehab PT Goals Patient Stated Goal: Pt did not state PT Goal Formulation: Patient unable to participate in goal setting Time For Goal Achievement: 09/02/24 Potential to Achieve Goals: Fair    Frequency Min 2X/week     Co-evaluation               AM-PAC PT 6 Clicks Mobility  Outcome Measure Help needed turning from your back to your side while in a flat bed without using bedrails?: A Little Help needed moving from lying on your back to sitting on the side of a flat bed without using bedrails?: A Lot Help needed moving to and from a bed to a chair (including a wheelchair)?: Total Help needed standing up from a chair using your arms (e.g., wheelchair or bedside chair)?: Total Help needed to walk in hospital room?: Total Help needed climbing 3-5 steps with a railing? : Total 6 Click Score: 9    End of Session   Activity Tolerance: Patient limited by fatigue Patient left: in bed;with call bell/phone within reach Nurse Communication: Mobility status PT Visit Diagnosis: Unsteadiness on feet (R26.81);Other abnormalities of gait and mobility (R26.89);Muscle weakness  (generalized) (M62.81)    Time: 8861-8844 PT Time Calculation (min) (ACUTE ONLY): 17 min   Charges:   PT Evaluation $PT Eval Moderate Complexity: 1 Mod   PT General Charges $$ ACUTE PT VISIT: 1 Visit         [facility] PT Acute Rehabilitation Services Office [phone]   Rodgers ORN [facility] 08/19/2024, 2:30 PM

## 2024-08-19 NOTE — ED Notes (Signed)
 5W called and notified that this pt is coming up to their room 34.

## 2024-08-19 NOTE — ED Notes (Signed)
 Pt very argumentative with clinical research associate throughout shift. Pt paranoid about any documentation stating the writer is trying to make her look her look bad. Pt requires continuous education and encouragement to take medications. Pt upset about med being crushed to her appropriate consistency. Pt refusing all nebulized treatments and states we are trying to kill her. Writer continued to re-educate pt about medications and the benefits. Dr. Caleen made aware of pt behavior and medication refusal.

## 2024-08-19 NOTE — Evaluation (Signed)
 Clinical/Bedside Swallow Evaluation Patient Details  Name: Deanna Rivera MRN: [id number] Date of Birth: 04/21/1933  Today's Date: 08/19/2024 Time: SLP Start Time (ACUTE ONLY): 1039 SLP Stop Time (ACUTE ONLY): 1110 SLP Time Calculation (min) (ACUTE ONLY): 31 min  Past Medical History:  Past Medical History:  Diagnosis Date   Cataract    Hypertension    Moderate aortic stenosis    Moderate mitral regurgitation    Persistent atrial fibrillation (HCC)    PMR (polymyalgia rheumatica)    Second degree AV block    Sick sinus syndrome (HCC)    Thyroid  disease    Past Surgical History:  Past Surgical History:  Procedure Laterality Date   ABDOMINAL HYSTERECTOMY     APPENDECTOMY     CESAREAN SECTION     x 3   CHOLECYSTECTOMY     PACEMAKER GENERATOR CHANGE  11/20/2018   [employer] Accolade MRI PPM implanted in [place]    PACEMAKER IMPLANT     PILONIDAL CYST EXCISION     HPI:  89 yo female presenting to ED 1/6 from ILF with generalized weakness, fatigue, and shortness of breath. MBS 11/05/22 shows mild-moderate pharyngeal dysphagia that is thought to be chronic in nature related to degenerative changes and spurring in cervical vertebrae as well as generalized weakness. Persistent penetration, often to the vocal folds, occurred and elicited a frequent cough response with minimal aspiration. Function also impacted by significant vallecular residue. Of note, OP MBS was scheduled for 09/14/24. PMH includes paroxysmal A-fib, second degree AV block s/p pacemaker, HTN, PMR, hypothyroidism, dysphagia, moderate MR    Assessment / Plan / Recommendation  Clinical Impression  While dysphagia is thought to be more chronic in nature, pt is agreeable to proceeding with an MBS while admitted since she was planning to f/u as an OP soon. Unsure that radiology's scheduling will accommodate completion this date but will f/u as able. In the interim, continue current diet. Meds can be given whole with  puree.   Pt has limited recall of previous MBS but reports she scheduled upcoming OP MBS due to her husband's complaints of audible swallowing and throat clearance. No coughing or throat clearance followed sips of thin liquids in isolation. Once solids were introduced and she sequenced small bites and sips of water with multiple swallows, throat clearance followed. She reports she has experienced globus in the past and has developed a very effortful method of masticating/swallowing solids to avoid recurrence. Her performance may be indicative of an esophageal component but could also represent persistent pharygneal residue noted on previous MBS 2024.   SLP Visit Diagnosis: Dysphagia, unspecified (R13.10)    Aspiration Risk  Mild aspiration risk    Diet Recommendation           Other Recommendations Oral Care Recommendations: Oral care BID     Swallow Evaluation Recommendations Recommendations: PO diet PO Diet Recommendation: Dysphagia 3 (Mechanical soft);Thin liquids (Level 0) Liquid Administration via: Cup;Straw Medication Administration: Whole meds with puree Supervision: Patient able to self-feed;Intermittent supervision/cueing for swallowing strategies Swallowing strategies  : Minimize environmental distractions;Slow rate;Small bites/sips Postural changes: Position pt fully upright for meals;Stay upright 30-60 min after meals Oral care recommendations: Oral care BID (2x/day)   Assistance Recommended at Discharge    Functional Status Assessment    Frequency and Duration            Prognosis Prognosis for improved oropharyngeal function: Good Barriers to Reach Goals: Time post onset      Swallow Study  General HPI: 89 yo female presenting to ED 1/6 from ILF with generalized weakness, fatigue, and shortness of breath. MBS 11/05/22 shows mild-moderate pharyngeal dysphagia that is thought to be chronic in nature related to degenerative changes and spurring in cervical vertebrae as  well as generalized weakness. Persistent penetration, often to the vocal folds, occurred and elicited a frequent cough response with minimal aspiration. Function also impacted by significant vallecular residue. Of note, OP MBS was scheduled for 09/14/24. PMH includes paroxysmal A-fib, second degree AV block s/p pacemaker, HTN, PMR, hypothyroidism, dysphagia, moderate MR Type of Study: Bedside Swallow Evaluation Previous Swallow Assessment: see HPI Diet Prior to this Study: Dysphagia 3 (mechanical soft);Thin liquids (Level 0) Temperature Spikes Noted: No Respiratory Status: Room air History of Recent Intubation: No Behavior/Cognition: Alert;Cooperative Oral Cavity Assessment: Within Functional Limits Oral Care Completed by SLP: No Oral Cavity - Dentition: Adequate natural dentition Vision: Functional for self-feeding Self-Feeding Abilities: Able to feed self Patient Positioning: Upright in bed Baseline Vocal Quality: Normal Volitional Cough: Strong Volitional Swallow: Able to elicit    Oral/Motor/Sensory Function Overall Oral Motor/Sensory Function: Within functional limits   Ice Chips Ice chips: Not tested   Thin Liquid Thin Liquid: Within functional limits Presentation: Straw;Self Fed    Nectar Thick Nectar Thick Liquid: Not tested   Honey Thick Honey Thick Liquid: Not tested   Puree Puree: Not tested   Solid     Solid: Impaired Presentation: Self Fed Pharyngeal Phase Impairments: Multiple swallows;Throat Clearing - Immediate      [name], M.A., CCC-SLP Speech Language Pathology, Acute Rehabilitation Services  Secure Chat preferred [phone]  08/19/2024,12:30 PM

## 2024-08-19 NOTE — Hospital Course (Addendum)
 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  She was found to have COVID-19 infection along with acute diastolic CHF with hypoxia.   Assessment & Plan:  Acute hypoxic respiratory failure - Multifactorial with combination of CHF exacerbation and COVID-19 infection  Acute COVID-19 infection with hypoxia, improved - Good pulmonary hygiene.  Bronchodilators, I-S/flutter valve.  Check and trend inflammatory markers.  Will add scheduled bronchodilators, twice daily Mucinex  - Reduce steroids, significant anxiety.  Hypoxia improved - Procalcitonin unequivocal 0.4, proBNP  Acute diastolic CHF with preserved EF, 65% Second-degree AV block has chronic pacemaker Moderate MR Severe aortic stenosis - Echocardiogram in August 2025 showed EF 65% with dilated cardiomyopathy along with valvular disease. Class III symptoms.  Received 60 mg of IV Lasix  in the ER.  Has elevated BNP.  Will closely monitor volume status-preload/afterload. Will closely monitor volume status and give Lasix  as necessary.  Elevated troponin - Demand ischemia.  Severe anxiety and agitation - Will reduce steroids.  Add Zyprexa   Ongoing chronic outpatient dysphagia - Currently on dysphagia 3 diet.  Speech and swallow evaluation  Hypothyroidism - Synthroid   Polymyalgia rheumatica - Continue steroids, eventually will resume home p.o.  Paroxysmal atrial fibrillation - Continue home digoxin , beta-blocker and Eliquis   Essential hypertension - On hydralazine , Toprol .  IV as needed  DVT prophylaxis: Eliquis  DNR/DNI Family Communication:  [employer]; he is uptodate Status is: Inpatient Remains inpatient appropriate because: On going management for COVID   PT  Follow up Recs:   Subjective: Very anxious. Denies any SOB at rest, chest pain Reports of being very weak.    Examination:  General exam: Appears calm and comfortable ; weak frail Respiratory system: Clear to auscultation. Respiratory effort normal. Cardiovascular system: S1 & S2 heard, RRR. No JVD, murmurs, rubs, gallops or clicks. No pedal edema. Gastrointestinal system: Abdomen is nondistended, soft and nontender. No organomegaly or masses felt. Normal bowel sounds heard. Central nervous system: Alert and oriented. No focal neurological deficits. Extremities: Symmetric 5 x 5 power. Skin: No rashes, lesions or ulcers Psychiatry: Judgement and insight appear normal. Mood & affect appropriate. Very anxious

## 2024-08-19 NOTE — Progress Notes (Signed)
 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  She was found to have COVID-19 infection along with acute diastolic CHF with hypoxia.   Assessment & Plan:  Acute hypoxic respiratory failure - Multifactorial with combination of CHF exacerbation and COVID-19 infection  Acute COVID-19 infection with hypoxia, improved - Good pulmonary hygiene.  Bronchodilators, I-S/flutter valve.  Check and trend inflammatory markers.  Will add scheduled bronchodilators, twice daily Mucinex  - Reduce steroids, significant anxiety.  Hypoxia improved - Procalcitonin unequivocal 0.4, proBNP  Acute diastolic CHF with preserved EF, 65% Second-degree AV block has chronic pacemaker Moderate MR Severe aortic stenosis - Echocardiogram in August 2025 showed EF 65% with dilated cardiomyopathy along with valvular disease. Class III symptoms.  Received 60 mg of IV Lasix  in the ER.  Has elevated BNP.  Will closely monitor volume status-preload/afterload. Will closely monitor volume status and give Lasix  as necessary.  Elevated troponin - Demand ischemia.  Severe anxiety and agitation - Will reduce steroids.  Add Zyprexa   Ongoing chronic outpatient dysphagia - Currently on dysphagia 3 diet.  Speech and swallow evaluation  Hypothyroidism - Synthroid   Polymyalgia rheumatica - Continue steroids, eventually will resume home p.o.  Paroxysmal atrial fibrillation - Continue home digoxin , beta-blocker and Eliquis   Essential hypertension - On hydralazine , Toprol .  IV as needed  DVT prophylaxis: Eliquis  DNR/DNI Family Communication:  [employer]; he is  uptodate Status is: Inpatient Remains inpatient appropriate because: On going management for COVID   PT Follow up Recs:   Subjective: Very anxious. Denies any SOB at rest, chest pain Reports of being very weak.    Examination:  General exam: Appears calm and comfortable ; weak frail Respiratory system: Clear to auscultation. Respiratory effort normal. Cardiovascular system: S1 & S2 heard, RRR. No JVD, murmurs, rubs, gallops or clicks. No pedal edema. Gastrointestinal system: Abdomen is nondistended, soft and nontender. No organomegaly or masses felt. Normal bowel sounds heard. Central nervous system: Alert and oriented. No focal neurological deficits. Extremities: Symmetric 5 x 5 power. Skin: No rashes, lesions or ulcers Psychiatry: Judgement and insight appear normal. Mood & affect appropriate. Very anxious                Diet Orders (From admission, onward)     Start     Ordered   08/18/24 1438  DIET DYS 3 Room service appropriate? Yes; Fluid consistency: Thin; Fluid restriction: 1500 mL Fluid  Diet effective now       Question Answer Comment  Room service appropriate? Yes   Fluid consistency: Thin   Fluid restriction: 1500 mL Fluid      08/18/24 1437            Objective: Vitals:   08/19/24 0930 08/19/24 0945 08/19/24 1000 08/19/24 1121  BP:   108/65   Pulse: 91 (!) 114 78 91  Resp: (!) 31 (!) 29 (!) 24   Temp:      TempSrc:      SpO2: 96% 96% 94%   Weight:      Height:        Intake/Output Summary (Last 24 hours) at 08/19/2024 1136 Last data filed at 08/18/2024 2033 Gross per 24 hour  Intake --  Output 280 ml  Net -280 ml   Filed Weights   08/18/24 1155  Weight: 52.2 kg    Scheduled Meds:  acetaminophen   650 mg Rectal Once   apixaban   2.5 mg Oral BID   arformoterol   15 mcg Nebulization BID   digoxin   125 mcg Oral Daily   dorzolamide -timolol   1 drop Both Eyes BID   guaiFENesin   1,200 mg Oral BID   hydrALAZINE   25 mg Oral Q8H    latanoprost   1 drop Both Eyes QHS   levothyroxine   125 mcg Oral Daily   [START ON 08/20/2024] methylPREDNISolone  (SOLU-MEDROL ) injection  40 mg Intravenous Q24H   metoprolol  succinate  12.5 mg Oral Daily   OLANZapine   2.5 mg Oral Q8H   revefenacin   175 mcg Nebulization Daily   Continuous Infusions:  Nutritional status     Body mass index is 19.75 kg/m.  Data Reviewed:   CBC: Recent Labs  Lab 08/18/24 1312 08/19/24 0523  WBC 12.3* 10.1  NEUTROABS  --  8.5*  HGB 14.6 15.1*  HCT 43.0 44.9  MCV 96.6 96.8  PLT 195 193   Basic Metabolic Panel: Recent Labs  Lab 08/18/24 1312 08/19/24 0523  NA 135 138  K 4.0 4.3  CL 95* 98  CO2 31 30  GLUCOSE 127* 118*  BUN 20 24*  CREATININE 0.39* 0.37*  CALCIUM 10.7* 10.4*  MG  --  2.1   GFR: Estimated Creatinine Clearance: 39.3 mL/min (A) (by C-G formula based on SCr of 0.37 mg/dL (L)). Liver Function Tests: No results for input(s): AST, ALT, ALKPHOS, BILITOT, PROT, ALBUMIN in the last 168 hours. No results for input(s): LIPASE, AMYLASE in the last 168 hours. No results for input(s): AMMONIA in the last 168 hours. Coagulation Profile: No results for input(s): INR, PROTIME in the last 168 hours. Cardiac Enzymes: No results for input(s): CKTOTAL, CKMB, CKMBINDEX, TROPONINI in the last 168 hours. BNP (last 3 results) Recent Labs    08/18/24 1312  PROBNP 2,287.0*   HbA1C: No results for input(s): HGBA1C in the last 72 hours. CBG: No results for input(s): GLUCAP in the last 168 hours. Lipid Profile: No results for input(s): CHOL, HDL, LDLCALC, TRIG, CHOLHDL, LDLDIRECT in the last 72 hours. Thyroid  Function Tests: No results for input(s): TSH, T4TOTAL, FREET4, T3FREE, THYROIDAB in the last 72 hours. Anemia Panel: No results for input(s): VITAMINB12, FOLATE, FERRITIN, TIBC, IRON, RETICCTPCT in the last 72 hours. Sepsis Labs: Recent Labs  Lab 08/18/24 1325  08/18/24 1558 08/19/24 0523  PROCALCITON  --   --  0.44  LATICACIDVEN 1.8 1.6  --     Recent Results (from the past 240 hours)  Resp panel by RT-PCR (RSV, Flu A&B, Covid) Anterior Nasal Swab     Status: Abnormal   Collection Time: 08/18/24  1:05 PM   Specimen: Anterior Nasal Swab  Result Value Ref Range Status   SARS Coronavirus 2 by RT PCR POSITIVE (A) NEGATIVE Final   Influenza A by PCR NEGATIVE NEGATIVE Final   Influenza B by PCR NEGATIVE NEGATIVE Final    Comment: (NOTE) The Xpert Xpress SARS-CoV-2/FLU/RSV plus assay is intended as an aid in the diagnosis of influenza from Nasopharyngeal swab specimens and should not be used as a sole basis for treatment. Nasal washings and aspirates are unacceptable for Xpert Xpress SARS-CoV-2/FLU/RSV testing.  Fact Sheet for Patients: bloggercourse.com  Fact Sheet for Healthcare Providers: seriousbroker.it  This test is not yet approved or cleared by the  [place]  FDA and has been authorized for detection and/or diagnosis of SARS-CoV-2 by FDA under an Emergency Use Authorization (EUA). This EUA will remain in effect (meaning this test can be used) for the duration of the COVID-19 declaration under Section 564(b)(1) of the Act, 21 U.S.C. section 360bbb-3(b)(1), unless the authorization is terminated or revoked.     Resp Syncytial Virus by PCR NEGATIVE NEGATIVE Final    Comment: (NOTE) Fact Sheet for Patients: bloggercourse.com  Fact Sheet for Healthcare Providers: seriousbroker.it  This test is not yet approved or cleared by the [place]  FDA and has been authorized for detection and/or diagnosis of SARS-CoV-2 by FDA under an Emergency Use Authorization (EUA). This EUA will remain in effect (meaning this test can be used) for the duration of the COVID-19 declaration under Section 564(b)(1) of the Act, 21 U.S.C. section  360bbb-3(b)(1), unless the authorization is terminated or revoked.  Performed at [facility], [address] [place], [place] [address]          Radiology Studies: DG Chest 2 View Result Date: 08/18/2024 CLINICAL DATA:  Shortness of breath and cough EXAM: DG CHEST 2V COMPARISON:  CT scan of the chest 08/13/2023 FINDINGS: Similar degree of cardiomegaly. Left subclavian approach cardiac rhythm maintenance device. Leads project over the right atrium and right ventricle. No significant vascular congestion or overt pulmonary edema. Nonspecific patchy left basilar airspace opacity favored to reflect atelectasis. No acute osseous abnormality. IMPRESSION: 1. Left retrocardiac patchy airspace opacity may reflect atelectasis or infiltrate. Atelectasis is favored. 2. Stable cardiomegaly without evidence of acute pulmonary edema. 3. Unchanged position of left subclavian approach cardiac rhythm maintenance device. Electronically Signed   By: Wilkie Lent M.D.   On: 08/18/2024 12:59           LOS: 1 day   Time spent= 35 mins    Burgess JAYSON Dare, MD [facility]  If 7PM-7AM, please contact night-coverage  08/19/2024, 11:36 AM  "

## 2024-08-19 NOTE — Progress Notes (Signed)
 Pt complaining of wet cough. RN offered prn robitussin which patient replied someone told me once I can't take that. Education provided.

## 2024-08-19 NOTE — ED Notes (Signed)
 Pt given AM meds crushed in apple sauce. No issues noted. Pt given water per her request.

## 2024-08-20 ENCOUNTER — Inpatient Hospital Stay (HOSPITAL_COMMUNITY)

## 2024-08-20 DIAGNOSIS — J9601 Acute respiratory failure with hypoxia: Secondary | ICD-10-CM | POA: Diagnosis not present

## 2024-08-20 LAB — CBC WITH DIFFERENTIAL/PLATELET
Abs Immature Granulocytes: 0.04 K/uL (ref 0.00–0.07)
Basophils Absolute: 0 K/uL (ref 0.0–0.1)
Basophils Relative: 0 %
Eosinophils Absolute: 0 K/uL (ref 0.0–0.5)
Eosinophils Relative: 0 %
HCT: 44.3 % (ref 36.0–46.0)
Hemoglobin: 15.3 g/dL — ABNORMAL HIGH (ref 12.0–15.0)
Immature Granulocytes: 0 %
Lymphocytes Relative: 9 %
Lymphs Abs: 0.9 K/uL (ref 0.7–4.0)
MCH: 32.7 pg (ref 26.0–34.0)
MCHC: 34.5 g/dL (ref 30.0–36.0)
MCV: 94.7 fL (ref 80.0–100.0)
Monocytes Absolute: 0.2 K/uL (ref 0.1–1.0)
Monocytes Relative: 1 %
Neutro Abs: 9.3 K/uL — ABNORMAL HIGH (ref 1.7–7.7)
Neutrophils Relative %: 90 %
Platelets: 241 K/uL (ref 150–400)
RBC: 4.68 MIL/uL (ref 3.87–5.11)
RDW: 13.4 % (ref 11.5–15.5)
WBC: 10.5 K/uL (ref 4.0–10.5)
nRBC: 0 % (ref 0.0–0.2)

## 2024-08-20 LAB — BASIC METABOLIC PANEL WITH GFR
Anion gap: 12 (ref 5–15)
BUN: 37 mg/dL — ABNORMAL HIGH (ref 8–23)
CO2: 30 mmol/L (ref 22–32)
Calcium: 11.1 mg/dL — ABNORMAL HIGH (ref 8.9–10.3)
Chloride: 96 mmol/L — ABNORMAL LOW (ref 98–111)
Creatinine, Ser: 0.45 mg/dL (ref 0.44–1.00)
GFR, Estimated: >60 mL/min
Glucose, Bld: 124 mg/dL — ABNORMAL HIGH (ref 70–99)
Potassium: 3.9 mmol/L (ref 3.5–5.1)
Sodium: 137 mmol/L (ref 135–145)

## 2024-08-20 LAB — MAGNESIUM: Magnesium: 2.3 mg/dL (ref 1.7–2.4)

## 2024-08-20 LAB — PHOSPHORUS: Phosphorus: 2.3 mg/dL — ABNORMAL LOW (ref 2.5–4.6)

## 2024-08-20 LAB — PROCALCITONIN: Procalcitonin: 0.36 ng/mL

## 2024-08-20 LAB — C-REACTIVE PROTEIN: CRP: 12.9 mg/dL — ABNORMAL HIGH

## 2024-08-20 MED ORDER — K PHOS MONO-SOD PHOS DI & MONO 155-852-130 MG PO TABS
500.0000 mg | ORAL_TABLET | Freq: Four times a day (QID) | ORAL | Status: AC
  Administered 2024-08-20 (×2): 500 mg via ORAL
  Filled 2024-08-20 (×3): qty 2

## 2024-08-20 MED ORDER — METHYLPREDNISOLONE SODIUM SUCC 40 MG IJ SOLR
40.0000 mg | Freq: Two times a day (BID) | INTRAMUSCULAR | Status: DC
  Administered 2024-08-20 – 2024-08-22 (×4): 40 mg via INTRAVENOUS
  Filled 2024-08-20 (×6): qty 1

## 2024-08-20 MED ORDER — FUROSEMIDE 10 MG/ML IJ SOLN
20.0000 mg | Freq: Every day | INTRAMUSCULAR | Status: DC
  Administered 2024-08-20 – 2024-08-22 (×3): 20 mg via INTRAVENOUS
  Filled 2024-08-20 (×3): qty 2

## 2024-08-20 MED ORDER — SODIUM CHLORIDE 0.9 % IV SOLN
200.0000 mg | Freq: Once | INTRAVENOUS | Status: AC
  Administered 2024-08-20: 200 mg via INTRAVENOUS
  Filled 2024-08-20: qty 40

## 2024-08-20 MED ORDER — SODIUM CHLORIDE 0.9 % IV SOLN
100.0000 mg | INTRAVENOUS | Status: AC
  Administered 2024-08-21 – 2024-08-22 (×2): 100 mg via INTRAVENOUS
  Filled 2024-08-20 (×2): qty 20

## 2024-08-20 MED ORDER — SODIUM CHLORIDE 0.9 % IV SOLN
100.0000 mg | Freq: Two times a day (BID) | INTRAVENOUS | Status: DC
  Administered 2024-08-20 – 2024-08-24 (×7): 100 mg via INTRAVENOUS
  Filled 2024-08-20 (×11): qty 100

## 2024-08-20 MED ORDER — FUROSEMIDE 10 MG/ML IJ SOLN
20.0000 mg | Freq: Once | INTRAMUSCULAR | Status: AC
  Administered 2024-08-20: 20 mg via INTRAVENOUS
  Filled 2024-08-20: qty 2

## 2024-08-20 MED ORDER — SODIUM CHLORIDE 0.9 % IV SOLN
2.0000 g | INTRAVENOUS | Status: AC
  Administered 2024-08-20 – 2024-08-24 (×5): 2 g via INTRAVENOUS
  Filled 2024-08-20 (×5): qty 20

## 2024-08-20 NOTE — Progress Notes (Signed)
 Around 0530 charge RN was notified that patient had called 911. This RN went into the room and patient was having a panic attack and yelling to her son on the phone she was in atrial over and over. RN attempted to calm patient, however, pt was sustaining 120-140HRs never dropping below 105 and in a-fib for about . During this time patient was also refusing prn IV metoprolol . Multiple education sessions provided with patient. Patient finally agreed to let RN give PRN, see eMAR. During administration, pt only required 3mg  of the 5mg  ordered as her HR began sustaining 60-80s while in afib. Patient then refused all 6am medications. MD notified of event. Patient resting calmly in bed, call bell within reach.

## 2024-08-20 NOTE — Progress Notes (Signed)
 Pt called out stating she cannot sleep and has been coughing all night. RN educated patient again on medication that patient has been refusing. Patient argumentative entire conversation. Patient agreeable to take prn cough medication, see eMAR. RN offered other medications to help with cough, mucus, and anxiety/sleep and patient refused and became upset. RN attempted education again and to calm patient without success or evidence of learning. RN left room to de-escalate and let patient self soothe. Patient upright, call bell within reach, bed alarm on. Oxygen levels in 90s on 2Lnc.

## 2024-08-20 NOTE — Progress Notes (Signed)
 SLP Cancellation Note  Patient Details Name: Deanna Rivera MRN: [id number] DOB: [birth year]   Cancelled treatment:       Reason Eval/Treat Not Completed: Patient transported to radiology for MBS but declined, stating that she was too tired to participate. Education and encouragement provided with pt continuing to refuse. SLP will f/u clinically before re-attempting instrumental testing (next date at earliest).    [name], M.A., CCC-SLP Speech Language Pathology, Acute Rehabilitation Services  Secure Chat preferred [phone]  08/20/2024, 3:35 PM

## 2024-08-20 NOTE — TOC Initial Note (Signed)
 Transition of Care [facility]) - Initial/Assessment Note    Patient Details  Name: Deanna Rivera MRN: [id number] Date of Birth: January 15, 1933  [facility] CM/SW Contact:    Inocente GORMAN Kindle, LCSW Phone Number: 08/20/2024, 9:42 AM  Clinical Narrative:                 Patient was admitted with COVID from [facility]. CSW confirmed with staff there that [place] and has a product/process development scientist for 3 hours daily. Patient is electric wheelchair level at baseline. CSW following to determine discharge venue as PT is recommending SNF.     Barriers to Discharge: Continued Medical Work up   Patient Goals and CMS Choice            Expected Discharge Plan and Services In-house Referral: Clinical Social Work     Living arrangements for the past 2 months: Independent Living Facility                                      Prior Living Arrangements/Services Living arrangements for the past 2 months: Independent Living Facility Lives with:: Spouse Patient language and need for interpreter reviewed:: Yes Do you feel safe going back to the place where you live?: Yes      Need for Family Participation in Patient Care: Yes (Comment) Care giver support system in place?: Yes (comment) Current home services: DME, Homehealth aide ([employer] [facility]) Criminal Activity/Legal Involvement Pertinent to Current Situation/Hospitalization: No - Comment as needed  Activities of Daily Living   ADL Screening (condition at time of admission) Independently performs ADLs?: No Does the patient have a NEW difficulty with bathing/dressing/toileting/self-feeding that is expected to last >3 days?: No Does the patient have a NEW difficulty with getting in/out of bed, walking, or climbing stairs that is expected to last >3 days?: No Does the patient have a NEW difficulty with communication that is expected to last >3 days?: No Is the patient deaf or have difficulty  hearing?: No Does the patient have difficulty seeing, even when wearing glasses/contacts?: No Does the patient have difficulty concentrating, remembering, or making decisions?: No  Permission Sought/Granted Permission sought to share information with : Facility Medical Sales Representative, Family Supports Permission granted to share information with : No  Share Information with NAME: SAVHANNA, SLIVA   [phone]  Permission granted to share info w AGENCY: [facility]        Emotional Assessment Appearance:: Appears stated age Attitude/Demeanor/Rapport: Unable to Assess Affect (typically observed): Unable to Assess Orientation: : Oriented to Self Alcohol / Substance Use: Not Applicable Psych Involvement: No (comment)  Admission diagnosis:  COVID [U07.1] Acute hypoxic respiratory failure (HCC) [J96.01] Patient Active Problem List   Diagnosis Date Noted   Acute hypoxic respiratory failure (HCC) 08/18/2024   Non-rheumatic aortic stenosis 05/28/2024   Depression, major, single episode, moderate (HCC) 03/25/2024   Immunosuppression due to chronic steroid use 03/25/2024   Bilateral lower extremity edema 01/15/2023   Pulmonary hypertension, unspecified (HCC) 01/15/2023   Protein-calorie malnutrition, severe 11/06/2022   Degenerative lumbar spinal stenosis 05/30/2022   Sensorineural hearing loss (SNHL) of both ears 01/14/2022   Frequent falls 05/17/2021   Age-related osteoporosis with current pathological fracture 02/06/2021   Compression fracture of body of thoracic vertebra (HCC) 02/06/2021   Primary osteoarthritis of right knee 10/23/2019   Neuralgia, postherpetic 07/02/2019   OAB (overactive bladder)  07/02/2019   SSS (sick sinus syndrome) (HCC) 02/11/2019   Constipation 01/18/2019   Status post placement of cardiac pacemaker 07/08/2018   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   Nonrheumatic mitral valve regurgitation 09/16/2015   Nontoxic multinodular goiter 09/08/2015    Current use of long term anticoagulation 05/16/2015   Recurrent UTI 03/14/2015   (HFpEF) heart failure with preserved ejection fraction (HCC) 09/13/2014   Pacemaker 11/09/2011   Impaired fasting glucose 05/23/2009   Long term current use of systemic steroids 03/30/2008   Polymyalgia rheumatica 03/30/2008   Benign essential hypertension 05/05/2007   Persistent atrial fibrillation (HCC) 11/06/2006   Pure hypercholesterolemia 11/06/2006   Hypothyroidism due to Hashimoto's thyroiditis, Rx Levothyroxine  06/25/2005   PCP:  Jodie Lavern CROME, MD Pharmacy:   [facility] [id number] [place] [place], [place] - [address] [place] [address] Phone: [phone] Fax: [phone]  [facility] - [place], [place] - [address] [place] [address] Phone: [phone] Fax: [phone]  [facility] [address] [place] [address] Phone: [phone] Fax: [phone]     Social Drivers of Health (SDOH) Social History: SDOH Screenings   Food Insecurity: No Food Insecurity (08/19/2024)  Housing: Low Risk (08/19/2024)  Transportation Needs: No Transportation Needs (08/19/2024)  Utilities: Not At Risk (08/19/2024)  Alcohol Screen: Low Risk (02/20/2024)  Depression (PHQ2-9): Low Risk (05/28/2024)  Financial Resource Strain: Low Risk (02/20/2024)  Physical Activity: Sufficiently Active (02/20/2024)  Social Connections: Moderately Isolated (08/19/2024)  Stress: No Stress Concern Present (02/20/2024)  Tobacco Use: Medium Risk (08/18/2024)  Health Literacy: Adequate Health Literacy (02/20/2024)   SDOH Interventions:     Readmission Risk Interventions     No data to display

## 2024-08-20 NOTE — Progress Notes (Signed)
 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 She was found to have COVID-19 infection along with acute diastolic CHF with hypoxia.   Assessment & Plan:   Principal Problem:   Acute hypoxic respiratory failure (HCC)   Acute hypoxic respiratory failure Acute infection with pneumonia with suspicion of upper imposed bacterial pneumonia -COVID 19 positive, with worsening hypoxia, chest x-ray this morning showing increasing opacity.  This morning requiring up to 8 L oxygen at rest - Increase IV steroids, will change to Medrol  40 mg IV twice daily (patient very hesitant of increasing steroids as causing increased anxiety but have informed her we need to increase the dose with worsening hypoxia and dyspnea. - CRP significantly elevated. - He was encouraged use incentive spirometry and flutter valve. -Will start on Mucinex . -Will encouraged with incentive spirometry and flutter valve. -Will start on IV Rocephin  and azithromycin for bacterial coverage. - Given rapidly hypoxia will start on remdesivir       Acute diastolic CHF with preserved EF, 65% Second-degree AV block has chronic pacemaker Moderate MR Severe aortic stenosis - Echocardiogram in August 2025 showed EF 65% with dilated cardiomyopathy along with valvular disease. Class III symptoms.   -Received 60 mg of IV Lasix  in the ER.  Has elevated BNP.  Will closely monitor volume status-preload/afterload. - Will start on low-dose Lasix  and give extra dosing as needed  Elevated troponin - Demand  ischemia.  Hypothermia -Will start on warming blanket   Severe anxiety and agitation - Add Zyprexa    Ongoing chronic outpatient dysphagia - Currently on dysphagia 3 diet.  Speech and swallow evaluation   Hypothyroidism - Synthroid    Polymyalgia rheumatica - Continue steroids, eventually will resume home p.o.   Paroxysmal atrial fibrillation - Continue home digoxin , beta-blocker and Eliquis , will check digoxin  level   Essential hypertension - On hydralazine , Toprol .  IV as needed    DVT prophylaxis: ( Eliquis  ) Code Status: (Full) Family Communication: ( none at bedside ) Disposition:   Status is: Inpatient    Consultants:   none  Subjective:  Reports dyspnea, cough and congestion, generalized weakness with increased oxygen requirement overnight.  Objective: Vitals:   08/20/24 0434 08/20/24 0534 08/20/24 0814 08/20/24 1200  BP: (!) 160/79 (!) 142/93 (!) 147/88 132/78  Pulse: 91  95 63  Resp: 20   16  Temp: (!) 97.4 F (36.3 C)     TempSrc: Oral     SpO2: 95% 93%  98%  Weight:      Height:       No intake or output data in the 24 hours ending 08/20/24 1312 Filed Weights   08/18/24 1155  Weight: 52.2 kg    Examination:  Awake Alert, frail, chronically ill-appearing Airway rales, scattered wheezing RRR +ve B.Sounds, Abd Soft, No tenderness Cold feet bilaterally, no clubbing or edema, No new Rash or bruise      Data Reviewed: I have personally reviewed following labs and imaging studies  CBC: Recent Labs  Lab 08/18/24 1312 08/19/24 0523 08/20/24 0352  WBC 12.3* 10.1 10.5  NEUTROABS  --  8.5* 9.3*  HGB 14.6 15.1* 15.3*  HCT 43.0 44.9 44.3  MCV 96.6 96.8 94.7  PLT 195 193 241    Basic Metabolic Panel: Recent Labs  Lab 08/18/24 1312 08/19/24 0523 08/20/24 0352  NA 135 138 137  K 4.0 4.3 3.9  CL 95* 98 96*  CO2 31 30 30   GLUCOSE 127* 118* 124*  BUN 20 24* 37*  CREATININE 0.39* 0.37* 0.45  CALCIUM 10.7* 10.4* 11.1*  MG  --  2.1  2.3  PHOS  --   --  2.3*    GFR: Estimated Creatinine Clearance: 39.3 mL/min (by C-G formula based on SCr of 0.45 mg/dL).  Liver Function Tests: No results for input(s): AST, ALT, ALKPHOS, BILITOT, PROT, ALBUMIN in the last 168 hours.  CBG: No results for input(s): GLUCAP in the last 168 hours.   Recent Results (from the past 240 hours)  Resp panel by RT-PCR (RSV, Flu A&B, Covid) Anterior Nasal Swab     Status: Abnormal   Collection Time: 08/18/24  1:05 PM   Specimen: Anterior Nasal Swab  Result Value Ref Range Status   SARS Coronavirus 2 by RT PCR POSITIVE (A) NEGATIVE Final   Influenza A by PCR NEGATIVE NEGATIVE Final   Influenza B by PCR NEGATIVE NEGATIVE Final    Comment: (NOTE) The Xpert Xpress SARS-CoV-2/FLU/RSV plus assay is intended as an aid in the diagnosis of influenza from Nasopharyngeal swab specimens and should not be used as a sole basis for treatment. Nasal washings and aspirates are unacceptable for Xpert Xpress SARS-CoV-2/FLU/RSV testing.  Fact Sheet for Patients: bloggercourse.com  Fact Sheet for Healthcare Providers: seriousbroker.it  This test is not yet approved or cleared by the [place]  FDA and has been authorized for detection and/or diagnosis of SARS-CoV-2 by FDA under an Emergency Use Authorization (EUA). This EUA will remain in effect (meaning this test can be used) for the duration of the COVID-19 declaration under Section 564(b)(1) of the Act, 21 U.S.C. section 360bbb-3(b)(1), unless the authorization is terminated or revoked.     Resp Syncytial Virus by PCR NEGATIVE NEGATIVE Final    Comment: (NOTE) Fact Sheet for Patients: bloggercourse.com  Fact Sheet for Healthcare Providers: seriousbroker.it  This test is not yet approved or cleared by the [place]  FDA and has been authorized for detection and/or diagnosis of  SARS-CoV-2 by FDA under an Emergency Use Authorization (EUA). This EUA will remain in effect (meaning this test can be used) for the duration of the COVID-19 declaration under Section 564(b)(1) of the Act, 21 U.S.C. section 360bbb-3(b)(1), unless the authorization is terminated or revoked.  Performed at [facility], [address], [place], [place] [address]          Radiology Studies: DG CHEST PORT 1 VIEW Result Date: 08/20/2024 CLINICAL DATA:  Dyspnea EXAM: PORTABLE CHEST 1 VIEW COMPARISON:  August 18, 2024 FINDINGS: Stable cardiomegaly. Left-sided pacemaker is unchanged. Significantly increased right middle lobe opacity is noted most consistent with pneumonia. Stable left retrocardiac opacity suggesting probable atelectasis. Bony thorax is unremarkable. IMPRESSION: Significantly increased right middle lobe opacity is noted most consistent with pneumonia. Electronically Signed   By: Lynwood Landy Raddle M.D.   On: 08/20/2024 09:47        Scheduled Meds:  acetaminophen   650 mg Rectal Once   apixaban   2.5 mg Oral BID   arformoterol   15 mcg Nebulization BID   digoxin   125 mcg Oral Daily   dorzolamide -timolol   1 drop Both Eyes BID  guaiFENesin   1,200 mg Oral BID   hydrALAZINE   25 mg Oral Q8H   latanoprost   1 drop Both Eyes QHS   levothyroxine   125 mcg Oral Daily   methylPREDNISolone  (SOLU-MEDROL ) injection  40 mg Intravenous Q12H   metoprolol  succinate  12.5 mg Oral Daily   OLANZapine   2.5 mg Oral Q8H   revefenacin   175 mcg Nebulization Daily   Continuous Infusions:  cefTRIAXone  (ROCEPHIN )  IV     doxycycline  (VIBRAMYCIN ) IV     [START ON 08/21/2024] remdesivir  100 mg in sodium chloride  0.9 % 100 mL IVPB       LOS: 2 days      Brayton Lye, MD [facility]   To contact the attending provider between 7A-7P or the covering provider during after hours 7P-7A, please log into the web site [email or web address] and access using universal [place] password for that  web site. If you do not have the password, please call the hospital operator.  08/20/2024, 1:12 PM   "

## 2024-08-20 NOTE — Progress Notes (Signed)
 Heart Failure Navigator Progress Note  Assessed for Heart & Vascular TOC clinic readiness.  Patient does not meet criteria due to EF 60-65%, has a scheduled [facility] appointment on 09/07/2024. No HF TOC. .   Navigator will sign off at this time.    Stephane Haddock, BSN, Scientist, Clinical (histocompatibility And Immunogenetics) Only

## 2024-08-21 ENCOUNTER — Inpatient Hospital Stay (HOSPITAL_COMMUNITY)

## 2024-08-21 DIAGNOSIS — J9601 Acute respiratory failure with hypoxia: Secondary | ICD-10-CM | POA: Diagnosis not present

## 2024-08-21 LAB — CBC WITH DIFFERENTIAL/PLATELET
Abs Immature Granulocytes: 0.03 K/uL (ref 0.00–0.07)
Basophils Absolute: 0 K/uL (ref 0.0–0.1)
Basophils Relative: 0 %
Eosinophils Absolute: 0 K/uL (ref 0.0–0.5)
Eosinophils Relative: 0 %
HCT: 44.2 % (ref 36.0–46.0)
Hemoglobin: 14.4 g/dL (ref 12.0–15.0)
Immature Granulocytes: 0 %
Lymphocytes Relative: 12 %
Lymphs Abs: 0.8 K/uL (ref 0.7–4.0)
MCH: 32.2 pg (ref 26.0–34.0)
MCHC: 32.6 g/dL (ref 30.0–36.0)
MCV: 98.9 fL (ref 80.0–100.0)
Monocytes Absolute: 0.2 K/uL (ref 0.1–1.0)
Monocytes Relative: 3 %
Neutro Abs: 5.8 K/uL (ref 1.7–7.7)
Neutrophils Relative %: 85 %
Platelets: 211 K/uL (ref 150–400)
RBC: 4.47 MIL/uL (ref 3.87–5.11)
RDW: 13.5 % (ref 11.5–15.5)
WBC: 6.9 K/uL (ref 4.0–10.5)
nRBC: 0 % (ref 0.0–0.2)

## 2024-08-21 LAB — BASIC METABOLIC PANEL WITH GFR
Anion gap: 14 (ref 5–15)
BUN: 48 mg/dL — ABNORMAL HIGH (ref 8–23)
CO2: 26 mmol/L (ref 22–32)
Calcium: 10.2 mg/dL (ref 8.9–10.3)
Chloride: 100 mmol/L (ref 98–111)
Creatinine, Ser: 0.47 mg/dL (ref 0.44–1.00)
GFR, Estimated: >60 mL/min
Glucose, Bld: 113 mg/dL — ABNORMAL HIGH (ref 70–99)
Potassium: 3.7 mmol/L (ref 3.5–5.1)
Sodium: 141 mmol/L (ref 135–145)

## 2024-08-21 LAB — PRO BRAIN NATRIURETIC PEPTIDE: Pro Brain Natriuretic Peptide: 3643 pg/mL — ABNORMAL HIGH

## 2024-08-21 LAB — MAGNESIUM: Magnesium: 2.2 mg/dL (ref 1.7–2.4)

## 2024-08-21 LAB — PHOSPHORUS: Phosphorus: 4 mg/dL (ref 2.5–4.6)

## 2024-08-21 LAB — DIGOXIN LEVEL: Digoxin Level: 1.6 ng/mL (ref 0.8–2.0)

## 2024-08-21 LAB — C-REACTIVE PROTEIN: CRP: 6.6 mg/dL — ABNORMAL HIGH

## 2024-08-21 LAB — PROCALCITONIN: Procalcitonin: 0.31 ng/mL

## 2024-08-21 NOTE — Progress Notes (Signed)
 Modified Barium Swallow Study  Patient Details  Name: Deanna Rivera MRN: [id number] Date of Birth: 1933-04-17  Today's Date: 08/21/2024  Modified Barium Swallow completed.  Full report located under Chart Review in the Imaging Section.  History of Present Illness 89 yo female presenting to ED 1/6 from ILF with generalized weakness, fatigue, and shortness of breath. MBS 11/05/22 shows mild-moderate pharyngeal dysphagia that is thought to be chronic in nature related to degenerative changes and spurring in cervical vertebrae as well as generalized weakness. Persistent penetration, often to the vocal folds, occurred and elicited a frequent cough response with minimal aspiration. Function also impacted by significant vallecular residue. Of note, OP MBS was scheduled for 09/14/24. CXR 1/8 shows significantly increased R middle lobe opacity, consistent with pneumonia. Supplemental O2 needs increased 1/8 to 8 LPM via  but returned to 3 LPM 1/9. PMH includes paroxysmal A-fib, second degree AV block s/p pacemaker, HTN, PMR, hypothyroidism, dysphagia, moderate MR   Clinical Impression Pt exhibits severe pharyngeal dysphagia, representing decreased function since previous MBS 10/2022. This is primarily caused by a prominent cricopharyngeus and degenerative changes of the cervical spine. Base of tongue retraction, anterior hyoid movement, and epiglottic inversion remain minimal, which contributes to decreased pharyngeal clearance and incomplete laryngeal closure. Thin and nectar thick liquids consistently enter the laryngeal vestibule and progress to the vocal folds during the swallow (PAS 5). Due to the amount of vallecular residue, the volume sitting on the vocal folds increases after the swallow and leads to aspiration of thin liquids after the swallow (PAS 7). This does not elicit a cough response and spontaneous throat clearance does not eject penetrates or aspirates. A chin tuck did not yield improved safety or  efficiency. Discussed with MD and would recommend she continue current diet, using strategies to minimize the risk of adverse events. Will f/u to provide education to pt and her family.  DIGEST Swallow Severity Rating*  Safety: 2  Efficiency: 3   Overall Pharyngeal Swallow Severity: 3 (severe) 1: mild; 2: moderate; 3: severe; 4: profound  *The Dynamic Imaging Grade of Swallowing Toxicity is standardized for the head and neck cancer population, however, demonstrates promising clinical applications across populations to standardize the clinical rating of pharyngeal swallow safety and severity.      Factors that may increase risk of adverse event in presence of aspiration [employer] 2021): Poor general health and/or compromised immunity;Reduced cognitive function;Limited mobility;Frail or deconditioned;Weak cough  Swallow Evaluation Recommendations Recommendations: PO diet PO Diet Recommendation: Dysphagia 3 (Mechanical soft);Thin liquids (Level 0) Liquid Administration via: Cup;Straw Medication Administration: Crushed with puree Supervision: Staff to assist with self-feeding;Intermittent supervision/cueing for swallowing strategies Swallowing strategies  : Minimize environmental distractions;Slow rate;Small bites/sips;Multiple dry swallows after each bite/sip Postural changes: Position pt fully upright for meals;Stay upright 30-60 min after meals Oral care recommendations: Oral care BID (2x/day);Oral care before PO Recommended consults: Consider Palliative care    [name], M.A., CCC-SLP Speech Language Pathology, Acute Rehabilitation Services  Secure Chat preferred [phone]  08/21/2024,4:08 PM

## 2024-08-21 NOTE — Progress Notes (Signed)
 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 She was found to have COVID-19 infection along with acute diastolic CHF with hypoxia.   Assessment & Plan:   Principal Problem:   Acute hypoxic respiratory failure (HCC)   Acute hypoxic respiratory failure Acute infection with pneumonia with suspicion of upper imposed bacterial pneumonia -COVID 19 positive, with worsening hypoxia, chest x-ray with worsening pneumonia, requiring up to 8 L nasal cannula, improving, she is on 3 L oxygen this morning, she is on room air at baseline - continue with Medrol  40 mg IV twice daily (patient very hesitant of increasing steroids as causing increased anxiety but have informed her we need to increase the dose with worsening hypoxia and dyspnea. - CRP significantly elevated. - He was encouraged use incentive spirometry and flutter valve. -Mucinex . -encouraged with incentive spirometry and flutter valve. - on IV Rocephin  and azithromycin for bacterial coverage. - Given rapidly worsening hypoxia she was started on remdesivir     Acute diastolic CHF with preserved EF, 65% Second-degree AV block has chronic pacemaker Moderate MR Severe aortic stenosis - Echocardiogram in August 2025 showed EF 65% with dilated cardiomyopathy along with valvular disease. Class III symptoms.   -Received 60 mg of IV Lasix  in the ER.  Has elevated BNP.  Will closely monitor volume status-preload/afterload. - Will start on low-dose Lasix  and give extra dosing as needed  Elevated troponin - Demand  ischemia.  Hypothermia -Will start on warming blanket   Severe anxiety and agitation - Add Zyprexa    Ongoing chronic outpatient dysphagia - Currently on dysphagia 3 diet.  Speech and swallow evaluation   Hypothyroidism - Synthroid    Polymyalgia rheumatica - Continue steroids, eventually will resume home p.o.   Paroxysmal atrial fibrillation - Continue home digoxin , beta-blocker and Eliquis , will check digoxin  level   Essential hypertension - On hydralazine , Toprol .  IV as needed    DVT prophylaxis: ( Eliquis  ) Code Status: (Full) Family Communication: ( none at bedside ) Disposition:   Status is: Inpatient    Consultants:   none  Subjective:  Orts she is feeling better today, dyspnea and cough has improved  Objective: Vitals:   08/21/24 0400 08/21/24 0500 08/21/24 0932 08/21/24 1422  BP: (!) 100/48 (!) 122/58 135/78 (!) (P) 129/53  Pulse: 62 68 80   Resp: 15 19 (!) 23   Temp: (!) 97.3 F (36.3 C)  99 F (37.2 C)   TempSrc: Axillary  Oral   SpO2: 100% 99% 91%   Weight:      Height:        Intake/Output Summary (Last 24 hours) at 08/21/2024 1547 Last data filed at 08/21/2024 0936 Gross per 24 hour  Intake 533.22 ml  Output 1300 ml  Net -766.78 ml   Filed Weights   08/18/24 1155  Weight: 52.2 kg    Examination:  Awake Alert, frail, chronically ill-appearing No wheezing today, improved air entry RRR +ve B.Sounds, Abd Soft, No tenderness No edema, cold feet bilaterally   Data Reviewed: I have personally reviewed following labs and imaging studies  CBC: Recent Labs  Lab  08/18/24 1312 08/19/24 0523 08/20/24 0352 08/21/24 0323  WBC 12.3* 10.1 10.5 6.9  NEUTROABS  --  8.5* 9.3* 5.8  HGB 14.6 15.1* 15.3* 14.4  HCT 43.0 44.9 44.3 44.2  MCV 96.6 96.8 94.7 98.9  PLT 195 193 241 211    Basic Metabolic Panel: Recent Labs  Lab 08/18/24 1312 08/19/24 0523 08/20/24 0352 08/21/24 0323  NA 135 138 137 141  K 4.0 4.3 3.9 3.7  CL 95* 98 96*  100  CO2 31 30 30 26   GLUCOSE 127* 118* 124* 113*  BUN 20 24* 37* 48*  CREATININE 0.39* 0.37* 0.45 0.47  CALCIUM 10.7* 10.4* 11.1* 10.2  MG  --  2.1 2.3 2.2  PHOS  --   --  2.3* 4.0    GFR: Estimated Creatinine Clearance: 39.3 mL/min (by C-G formula based on SCr of 0.47 mg/dL).  Liver Function Tests: No results for input(s): AST, ALT, ALKPHOS, BILITOT, PROT, ALBUMIN in the last 168 hours.  CBG: No results for input(s): GLUCAP in the last 168 hours.   Recent Results (from the past 240 hours)  Resp panel by RT-PCR (RSV, Flu A&B, Covid) Anterior Nasal Swab     Status: Abnormal   Collection Time: 08/18/24  1:05 PM   Specimen: Anterior Nasal Swab  Result Value Ref Range Status   SARS Coronavirus 2 by RT PCR POSITIVE (A) NEGATIVE Final   Influenza A by PCR NEGATIVE NEGATIVE Final   Influenza B by PCR NEGATIVE NEGATIVE Final    Comment: (NOTE) The Xpert Xpress SARS-CoV-2/FLU/RSV plus assay is intended as an aid in the diagnosis of influenza from Nasopharyngeal swab specimens and should not be used as a sole basis for treatment. Nasal washings and aspirates are unacceptable for Xpert Xpress SARS-CoV-2/FLU/RSV testing.  Fact Sheet for Patients: bloggercourse.com  Fact Sheet for Healthcare Providers: seriousbroker.it  This test is not yet approved or cleared by the [place]  FDA and has been authorized for detection and/or diagnosis of SARS-CoV-2 by FDA under an Emergency Use Authorization (EUA). This EUA will remain in effect (meaning this test can be used) for the duration of the COVID-19 declaration under Section 564(b)(1) of the Act, 21 U.S.C. section 360bbb-3(b)(1), unless the authorization is terminated or revoked.     Resp Syncytial Virus by PCR NEGATIVE NEGATIVE Final    Comment: (NOTE) Fact Sheet for Patients: bloggercourse.com  Fact Sheet for Healthcare  Providers: seriousbroker.it  This test is not yet approved or cleared by the [place]  FDA and has been authorized for detection and/or diagnosis of SARS-CoV-2 by FDA under an Emergency Use Authorization (EUA). This EUA will remain in effect (meaning this test can be used) for the duration of the COVID-19 declaration under Section 564(b)(1) of the Act, 21 U.S.C. section 360bbb-3(b)(1), unless the authorization is terminated or revoked.  Performed at [facility], [address], [place], [place] [address]          Radiology Studies: DG CHEST PORT 1 VIEW Result Date: 08/20/2024 CLINICAL DATA:  Dyspnea EXAM: PORTABLE CHEST 1 VIEW COMPARISON:  August 18, 2024 FINDINGS: Stable cardiomegaly. Left-sided pacemaker is unchanged. Significantly increased right middle lobe opacity is noted most consistent with pneumonia. Stable left retrocardiac opacity suggesting probable atelectasis. Bony thorax is unremarkable. IMPRESSION: Significantly increased right middle lobe opacity is noted most consistent with pneumonia. Electronically Signed   By: Lynwood Landy Raddle M.D.   On: 08/20/2024 09:47        Scheduled Meds:  acetaminophen   650 mg Rectal Once   apixaban   2.5 mg Oral BID   arformoterol   15 mcg Nebulization BID   digoxin   125 mcg Oral Daily   dorzolamide -timolol   1 drop Both Eyes BID   furosemide   20 mg Intravenous Daily   guaiFENesin   1,200 mg Oral BID   hydrALAZINE   25 mg Oral Q8H   latanoprost   1 drop Both Eyes QHS   levothyroxine   125 mcg Oral Daily   methylPREDNISolone  (SOLU-MEDROL ) injection  40 mg Intravenous Q12H   metoprolol  succinate  12.5 mg Oral Daily   OLANZapine   2.5 mg Oral Q8H   revefenacin   175 mcg Nebulization Daily   Continuous Infusions:  cefTRIAXone  (ROCEPHIN )  IV 2 g (08/21/24 1249)   doxycycline  (VIBRAMYCIN ) IV 100 mg (08/21/24 1252)   remdesivir  100 mg in sodium chloride  0.9 % 100 mL IVPB 100 mg (08/21/24 1016)     LOS: 3  days      Brayton Lye, MD [facility]   To contact the attending provider between 7A-7P or the covering provider during after hours 7P-7A, please log into the web site [email or web address] and access using universal [place] password for that web site. If you do not have the password, please call the hospital operator.  08/21/2024, 3:47 PM   "

## 2024-08-21 NOTE — TOC Progression Note (Signed)
 Transition of Care [facility]) - Progression Note    Patient Details  Name: Deanna Rivera MRN: [id number] Date of Birth: 1932-12-19  Transition of Care [facility]) CM/SW Contact  Landry DELENA Senters, RN Phone Number: 08/21/2024, 2:32 PM  Clinical Narrative:     CM spoke with patient and husband regarding SNF rec. Patient and husband both verbalize they likely will want her to return home at hospital d/c.   They are in agreement to review SNF options. Info to be faxed out.   CM called [facility], who have seen patient previously at [facility]. They report they are able to see patient for PT/OT/ST if she does decide to return home. They will need orders and most recent PT/ST therapy notes faxed to them at [phone].   Patient requested ICM f/u with her in a couple days to review SNF vs home decision.  ICM will continue to follow.     Barriers to Discharge: Continued Medical Work up               Expected Discharge Plan and Services In-house Referral: Clinical Social Work     Living arrangements for the past 2 months: Independent [employer]                                       Social Drivers of Health (SDOH) Interventions SDOH Screenings   Food Insecurity: No Food Insecurity (08/19/2024)  Housing: Low Risk (08/19/2024)  Transportation Needs: No Transportation Needs (08/19/2024)  Utilities: Not At Risk (08/19/2024)  Alcohol Screen: Low Risk (02/20/2024)  Depression (PHQ2-9): Low Risk (05/28/2024)  Financial Resource Strain: Low Risk (02/20/2024)  Physical Activity: Sufficiently Active (02/20/2024)  Social Connections: Moderately Isolated (08/19/2024)  Stress: No Stress Concern Present (02/20/2024)  Tobacco Use: Medium Risk (08/18/2024)  Health Literacy: Adequate Health Literacy (02/20/2024)    Readmission Risk Interventions     No data to display

## 2024-08-21 NOTE — Progress Notes (Signed)
 Speech Language Pathology Treatment: Dysphagia  Patient Details Name: Staphanie Biello MRN: [id number] DOB: 04-29-1933 Today's Date: 08/21/2024 Time: 8782-8769 SLP Time Calculation (min) (ACUTE ONLY): 13 min  Assessment / Plan / Recommendation Clinical Impression  Pt is agreeable to completing an MBS to ease anxiety she feels around eating/drinking. Clinically, her presentation appears generally similar to what is expected based on MBS 10/2022 showing chronic dysphagia. There is no overt coughing with thin liquids but multiple swallows and throat clearance are observed. Will f/u to complete MBS as scheduling allows. Continue current diet with meds whole in puree.    HPI HPI: 89 yo female presenting to ED 1/6 from ILF with generalized weakness, fatigue, and shortness of breath. MBS 11/05/22 shows mild-moderate pharyngeal dysphagia that is thought to be chronic in nature related to degenerative changes and spurring in cervical vertebrae as well as generalized weakness. Persistent penetration, often to the vocal folds, occurred and elicited a frequent cough response with minimal aspiration. Function also impacted by significant vallecular residue. Of note, OP MBS was scheduled for 09/14/24. PMH includes paroxysmal A-fib, second degree AV block s/p pacemaker, HTN, PMR, hypothyroidism, dysphagia, moderate MR      SLP Plan  MBS        Swallow Evaluation Recommendations   Recommendations: PO diet PO Diet Recommendation: Dysphagia 3 (Mechanical soft);Thin liquids (Level 0) Liquid Administration via: Cup;Straw Medication Administration: Whole meds with puree Supervision: Patient able to self-feed;Intermittent supervision/cueing for swallowing strategies Postural changes: Position pt fully upright for meals;Stay upright 30-60 min after meals Oral care recommendations: Oral care BID (2x/day)     Recommendations                     Oral care BID   PRN Dysphagia, unspecified (R13.10)      MBS     [name], M.A., CCC-SLP Speech Language Pathology, Acute Rehabilitation Services  Secure Chat preferred [phone]    08/21/2024, 1:06 PM

## 2024-08-21 NOTE — Plan of Care (Signed)

## 2024-08-21 NOTE — [place] FL2 (Signed)
 " [place]  MEDICAID FL2 LEVEL OF CARE FORM     IDENTIFICATION  Patient Name: Deanna Rivera Birthdate: 1932/11/19 Sex: female Admission Date (Current Location): 08/18/2024  [facility] and [place] Number:  Producer, Television/film/video and Address:  [facility], [address], [place], [place] [address]      Provider Number: [id number]  Attending Physician Name and Address:  Elgergawy, Brayton RAMAN, MD  Relative Name and Phone Number:       Current Level of Care: Hospital Recommended Level of Care: Skilled Nursing Facility Prior Approval Number:    Date Approved/Denied:   PASRR Number: [id number]  Discharge Plan: SNF    Current Diagnoses: Patient Active Problem List   Diagnosis Date Noted   Acute hypoxic respiratory failure (HCC) 08/18/2024   Non-rheumatic aortic stenosis 05/28/2024   Depression, major, single episode, moderate (HCC) 03/25/2024   Immunosuppression due to chronic steroid use 03/25/2024   Bilateral lower extremity edema 01/15/2023   Pulmonary hypertension, unspecified (HCC) 01/15/2023   Protein-calorie malnutrition, severe 11/06/2022   Degenerative lumbar spinal stenosis 05/30/2022   Sensorineural hearing loss (SNHL) of both ears 01/14/2022   Frequent falls 05/17/2021   Age-related osteoporosis with current pathological fracture 02/06/2021   Compression fracture of body of thoracic vertebra (HCC) 02/06/2021   Primary osteoarthritis of right knee 10/23/2019   Neuralgia, postherpetic 07/02/2019   OAB (overactive bladder) 07/02/2019   SSS (sick sinus syndrome) (HCC) 02/11/2019   Constipation 01/18/2019   Status post placement of cardiac pacemaker 07/08/2018   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   Nonrheumatic mitral valve regurgitation 09/16/2015   Nontoxic multinodular goiter 09/08/2015   Current use of long term anticoagulation 05/16/2015   Recurrent UTI 03/14/2015   (HFpEF) heart failure with preserved ejection fraction (HCC)  09/13/2014   Pacemaker 11/09/2011   Impaired fasting glucose 05/23/2009   Long term current use of systemic steroids 03/30/2008   Polymyalgia rheumatica 03/30/2008   Benign essential hypertension 05/05/2007   Persistent atrial fibrillation (HCC) 11/06/2006   Pure hypercholesterolemia 11/06/2006   Hypothyroidism due to Hashimoto's thyroiditis, Rx Levothyroxine  06/25/2005    Orientation RESPIRATION BLADDER Height & Weight     Self, Time, Situation, Place  O2 (3L nasal cannula) Incontinent, External catheter Weight: 115 lb 1.3 oz (52.2 kg) Height:  5' 4 (162.6 cm)  BEHAVIORAL SYMPTOMS/MOOD NEUROLOGICAL BOWEL NUTRITION STATUS      Continent Diet (see dc summary)  AMBULATORY STATUS COMMUNICATION OF NEEDS Skin   Limited Assist Verbally PU Stage and Appropriate Care (Stage II on buttocks)                       Personal Care Assistance Level of Assistance  Bathing, Feeding, Dressing Bathing Assistance: Limited assistance Feeding assistance: Independent Dressing Assistance: Limited assistance     Functional Limitations Info  Hearing   Hearing Info: Impaired      SPECIAL CARE FACTORS FREQUENCY  PT (By licensed PT), OT (By licensed OT)     PT Frequency: 5x/week OT Frequency: 5x/week            Contractures Contractures Info: Not present    Additional Factors Info  Code Status, Allergies, Isolation Precautions Code Status Info: DNR Allergies Info: Shellfish Protein-containing Drug Products, Epinephrine , Monosodium Glutamate, Ciprofloxacin, Dronedarone, Erythromycin, Sulfa Antibiotics, Sulfamethoxazole-trimethoprim, Tetracyclines & Related     Isolation Precautions Info: COVID + 08/18/24     Current Medications (08/21/2024):  This is the current hospital active medication list Current Facility-Administered  Medications  Medication Dose Route Frequency Provider Last Rate Last Admin   acetaminophen  (TYLENOL ) suppository 650 mg  650 mg Rectal Once Ransom, Riley, PA-C        acetaminophen  (TYLENOL ) tablet 650 mg  650 mg Oral Q6H PRN Singh, Prashant K, MD       Or   acetaminophen  (TYLENOL ) suppository 650 mg  650 mg Rectal Q6H PRN Singh, Prashant K, MD       apixaban  (ELIQUIS ) tablet 2.5 mg  2.5 mg Oral BID Singh, Prashant K, MD   2.5 mg at 08/21/24 1006   arformoterol  (BROVANA ) nebulizer solution 15 mcg  15 mcg Nebulization BID Amin, Ankit C, MD   15 mcg at 08/21/24 0901   bisacodyl  (DULCOLAX) EC tablet 5 mg  5 mg Oral Daily PRN Singh, Prashant K, MD       cefTRIAXone  (ROCEPHIN ) 2 g in sodium chloride  0.9 % 100 mL IVPB  2 g Intravenous Q24H Elgergawy, Dawood S, MD 200 mL/hr at 08/21/24 1249 2 g at 08/21/24 1249   digoxin  (LANOXIN ) tablet 125 mcg  125 mcg Oral Daily Singh, Prashant K, MD   125 mcg at 08/21/24 1005   dorzolamide -timolol  (COSOPT ) 2-0.5 % ophthalmic solution 1 drop  1 drop Both Eyes BID Singh, Prashant K, MD   1 drop at 08/21/24 1042   doxycycline  (VIBRAMYCIN ) 100 mg in sodium chloride  0.9 % 250 mL IVPB  100 mg Intravenous Q12H Elgergawy, Dawood S, MD 125 mL/hr at 08/21/24 1252 100 mg at 08/21/24 1252   furosemide  (LASIX ) injection 20 mg  20 mg Intravenous Daily Elgergawy, Dawood S, MD   20 mg at 08/21/24 1006   glucagon  (human recombinant) (GLUCAGEN) injection 1 mg  1 mg Intravenous PRN Amin, Ankit C, MD       guaiFENesin  (MUCINEX ) 12 hr tablet 1,200 mg  1,200 mg Oral BID Amin, Ankit C, MD   1,200 mg at 08/19/24 1120   guaiFENesin  (ROBITUSSIN) 100 MG/5ML liquid 5 mL  5 mL Oral Q4H PRN Amin, Ankit C, MD   5 mL at 08/20/24 0044   hydrALAZINE  (APRESOLINE ) injection 10 mg  10 mg Intravenous Q6H PRN Singh, Prashant K, MD   10 mg at 08/20/24 0439   hydrALAZINE  (APRESOLINE ) tablet 25 mg  25 mg Oral Q8H Singh, Prashant K, MD   25 mg at 08/20/24 1629   ipratropium-albuterol  (DUONEB) 0.5-2.5 (3) MG/3ML nebulizer solution 3 mL  3 mL Nebulization Q4H PRN Amin, Ankit C, MD       latanoprost  (XALATAN ) 0.005 % ophthalmic solution 1 drop  1 drop Both Eyes QHS Amin,  Ankit C, MD       levothyroxine  (SYNTHROID ) tablet 125 mcg  125 mcg Oral Daily Singh, Prashant K, MD   125 mcg at 08/19/24 9471   methylPREDNISolone  sodium succinate (SOLU-MEDROL ) 40 mg/mL injection 40 mg  40 mg Intravenous Q12H Elgergawy, Dawood S, MD   40 mg at 08/21/24 1244   metoprolol  succinate (TOPROL -XL) 24 hr tablet 12.5 mg  12.5 mg Oral Daily Singh, Prashant K, MD   12.5 mg at 08/21/24 1006   metoprolol  tartrate (LOPRESSOR ) injection 5 mg  5 mg Intravenous Q4H PRN Amin, Ankit C, MD   3 mg at 08/20/24 0545   OLANZapine  (ZYPREXA ) tablet 2.5 mg  2.5 mg Oral Q8H Amin, Ankit C, MD   2.5 mg at 08/20/24 2232   ondansetron  (ZOFRAN ) tablet 4 mg  4 mg Oral Q6H PRN Singh, Prashant K, MD  Or   ondansetron  (ZOFRAN ) injection 4 mg  4 mg Intravenous Q6H PRN Singh, Prashant K, MD   4 mg at 08/20/24 0810   polyethylene glycol (MIRALAX  / GLYCOLAX ) packet 17 g  17 g Oral Daily PRN Singh, Prashant K, MD       remdesivir  100 mg in sodium chloride  0.9 % 100 mL IVPB  100 mg Intravenous Q24 Hr x 2 Powell, Lisa K, RPH 200 mL/hr at 08/21/24 1016 100 mg at 08/21/24 1016   revefenacin  (YUPELRI ) nebulizer solution 175 mcg  175 mcg Nebulization Daily Amin, Ankit C, MD   175 mcg at 08/21/24 0901     Discharge Medications: Please see discharge summary for a list of discharge medications.  Relevant Imaging Results:  Relevant Lab Results:   Additional Information [id number]  Inocente GORMAN Kindle, LCSW     "

## 2024-08-21 NOTE — Progress Notes (Signed)
 PT Cancellation Note  Patient Details Name: Deanna Rivera MRN: [id number] DOB: May 07, 1933   Cancelled Treatment:    Reason Eval/Treat Not Completed: (P) Patient at procedure or test/unavailable (Pt off floor for MBS. Will follow up as able.)   Darryle George 08/21/2024, 3:16 PM

## 2024-08-22 DIAGNOSIS — J9601 Acute respiratory failure with hypoxia: Secondary | ICD-10-CM | POA: Diagnosis not present

## 2024-08-22 LAB — CBC WITH DIFFERENTIAL/PLATELET
Abs Immature Granulocytes: 0.03 K/uL (ref 0.00–0.07)
Basophils Absolute: 0 K/uL (ref 0.0–0.1)
Basophils Relative: 0 %
Eosinophils Absolute: 0 K/uL (ref 0.0–0.5)
Eosinophils Relative: 0 %
HCT: 43.3 % (ref 36.0–46.0)
Hemoglobin: 14.3 g/dL (ref 12.0–15.0)
Immature Granulocytes: 1 %
Lymphocytes Relative: 12 %
Lymphs Abs: 0.7 K/uL (ref 0.7–4.0)
MCH: 32.5 pg (ref 26.0–34.0)
MCHC: 33 g/dL (ref 30.0–36.0)
MCV: 98.4 fL (ref 80.0–100.0)
Monocytes Absolute: 0.2 K/uL (ref 0.1–1.0)
Monocytes Relative: 3 %
Neutro Abs: 5 K/uL (ref 1.7–7.7)
Neutrophils Relative %: 84 %
Platelets: 234 K/uL (ref 150–400)
RBC: 4.4 MIL/uL (ref 3.87–5.11)
RDW: 13.4 % (ref 11.5–15.5)
WBC: 5.9 K/uL (ref 4.0–10.5)
nRBC: 0 % (ref 0.0–0.2)

## 2024-08-22 LAB — PHOSPHORUS: Phosphorus: 2.7 mg/dL (ref 2.5–4.6)

## 2024-08-22 LAB — BASIC METABOLIC PANEL WITH GFR
Anion gap: 8 (ref 5–15)
BUN: 48 mg/dL — ABNORMAL HIGH (ref 8–23)
CO2: 32 mmol/L (ref 22–32)
Calcium: 10.3 mg/dL (ref 8.9–10.3)
Chloride: 100 mmol/L (ref 98–111)
Creatinine, Ser: 0.37 mg/dL — ABNORMAL LOW (ref 0.44–1.00)
GFR, Estimated: >60 mL/min
Glucose, Bld: 131 mg/dL — ABNORMAL HIGH (ref 70–99)
Potassium: 4 mmol/L (ref 3.5–5.1)
Sodium: 140 mmol/L (ref 135–145)

## 2024-08-22 LAB — C-REACTIVE PROTEIN: CRP: 4.7 mg/dL — ABNORMAL HIGH

## 2024-08-22 LAB — MAGNESIUM: Magnesium: 2.2 mg/dL (ref 1.7–2.4)

## 2024-08-22 LAB — PROCALCITONIN: Procalcitonin: 0.17 ng/mL

## 2024-08-22 MED ORDER — FUROSEMIDE 10 MG/ML IJ SOLN
40.0000 mg | Freq: Once | INTRAMUSCULAR | Status: AC
  Administered 2024-08-22: 40 mg via INTRAVENOUS
  Filled 2024-08-22: qty 4

## 2024-08-22 MED ORDER — PANTOPRAZOLE SODIUM 40 MG PO TBEC
80.0000 mg | DELAYED_RELEASE_TABLET | Freq: Once | ORAL | Status: AC
  Administered 2024-08-22: 80 mg via ORAL
  Filled 2024-08-22: qty 2

## 2024-08-22 MED ORDER — PANTOPRAZOLE SODIUM 40 MG PO TBEC
40.0000 mg | DELAYED_RELEASE_TABLET | Freq: Every day | ORAL | Status: DC
  Administered 2024-08-23: 40 mg via ORAL
  Filled 2024-08-22 (×2): qty 1

## 2024-08-22 MED ORDER — MIDODRINE HCL 5 MG PO TABS
5.0000 mg | ORAL_TABLET | Freq: Three times a day (TID) | ORAL | Status: DC
  Filled 2024-08-22 (×2): qty 1

## 2024-08-22 MED ORDER — FUROSEMIDE 10 MG/ML IJ SOLN
40.0000 mg | Freq: Every day | INTRAMUSCULAR | Status: DC
  Administered 2024-08-23: 40 mg via INTRAVENOUS
  Filled 2024-08-22: qty 4

## 2024-08-22 NOTE — Evaluation (Signed)
 RT Evaluate and Treat Note  08/22/2024   Breathing is (select one): Same as normal    The following was found on auscultation (select multiple):  Bilateral Breath Sounds: Clear;Diminished (08/22/24 1036)  R Upper  Breath Sounds: Clear;Diminished (08/22/24 1036) L Upper Breath Sounds: Clear;Diminished (08/22/24 1036) R Lower Breath Sounds: Diminished (08/22/24 1036) L Lower Breath Sounds: Diminished (08/22/24 1036)    Cough Assessment: Cough: Non-productive (08/22/24 1036)    Most Recent Chest Xray:... (No results found.    The following medications and/or interventions were ordered/changed/discontinued as part of the Respiratory Treatment protocol:   Medication Changes: Budesonide Discontinued and Yupelri  Discontinued   Airway Clearance Changes: No Change   Oxygen Therapy Changes:No Change

## 2024-08-22 NOTE — Progress Notes (Signed)
 [place] [id number] [facility]?Hospital Liaison Note:??   ???   Notified by Marval, care manager of patient/family request for [facility] Palliative services at home after discharge.????????   ????   Hospital liaison will follow patient for discharge disposition.?   ????   Please call with any hospice or outpatient palliative care related questions.???   ????   Thank you for the opportunity to participate in this patients care.   Nat Babe, BSN, [employer] [phone]

## 2024-08-22 NOTE — TOC Initial Note (Signed)
 Transition of Care [facility]) - Initial/Assessment Note    Patient Details  Name: Deanna Rivera MRN: [id number] Date of Birth: November 06, 1932  [facility] CM/SW Contact:    Marval Gell, RN Phone Number: 08/22/2024, 4:00 PM  Clinical Narrative:                  Beatris w patient's son Franky on room line (cell not working, number verified in chart) He states that patient lives in [place] w spouse at [employer]. She has caregivers (he could not provide name of company at the moment) that services 3 hours a day. He is going to reach out to them tonigh and have services increased up to 12 hours. Suggested he reach out to [facility] to see if they would require her to go to ALF but he states as long as she has caregivers, they would not insist on ALF.  Discussed possibly needing added HH to set up, will set up if needed after he discusses with caregivers.  He states that they would like palliative services, explained differences between palliative and hospice and provided choice. Referral made to [facility].  He states that she has a set up bathroom and a motirized WC at home.  Provided my name and callback info, he is to reach back out to me after he speaks with caregivers    Barriers to Discharge: Continued Medical Work up   Patient Goals and CMS Choice            Expected Discharge Plan and Services In-house Referral: Clinical Social Work     Living arrangements for the past 2 months: Independent Living Facility                                      Prior Living Arrangements/Services Living arrangements for the past 2 months: Independent Living Facility Lives with:: Spouse Patient language and need for interpreter reviewed:: Yes Do you feel safe going back to the place where you live?: Yes      Need for Family Participation in Patient Care: Yes (Comment) Care giver support system in place?: Yes (comment) Current home services: DME, Homehealth aide ([facility]) Criminal  Activity/Legal Involvement Pertinent to Current Situation/Hospitalization: No - Comment as needed  Activities of Daily Living   ADL Screening (condition at time of admission) Independently performs ADLs?: No Does the patient have a NEW difficulty with bathing/dressing/toileting/self-feeding that is expected to last >3 days?: No Does the patient have a NEW difficulty with getting in/out of bed, walking, or climbing stairs that is expected to last >3 days?: No Does the patient have a NEW difficulty with communication that is expected to last >3 days?: No Is the patient deaf or have difficulty hearing?: No Does the patient have difficulty seeing, even when wearing glasses/contacts?: No Does the patient have difficulty concentrating, remembering, or making decisions?: No  Permission Sought/Granted Permission sought to share information with : Facility Medical Sales Representative, Family Supports Permission granted to share information with : No  Share Information with NAME: LIBIA, FAZZINI   [phone]  Permission granted to share info w AGENCY: [facility]        Emotional Assessment Appearance:: Appears stated age Attitude/Demeanor/Rapport: Unable to Assess Affect (typically observed): Unable to Assess Orientation: : Oriented to Self Alcohol / Substance Use: Not Applicable Psych Involvement: No (comment)  Admission diagnosis:  COVID [U07.1] Acute hypoxic respiratory failure (HCC) [  J96.01] Patient Active Problem List   Diagnosis Date Noted   Acute hypoxic respiratory failure (HCC) 08/18/2024   Non-rheumatic aortic stenosis 05/28/2024   Depression, major, single episode, moderate (HCC) 03/25/2024   Immunosuppression due to chronic steroid use 03/25/2024   Bilateral lower extremity edema 01/15/2023   Pulmonary hypertension, unspecified (HCC) 01/15/2023   Protein-calorie malnutrition, severe 11/06/2022   Degenerative lumbar spinal stenosis 05/30/2022   Sensorineural hearing loss  (SNHL) of both ears 01/14/2022   Frequent falls 05/17/2021   Age-related osteoporosis with current pathological fracture 02/06/2021   Compression fracture of body of thoracic vertebra (HCC) 02/06/2021   Primary osteoarthritis of right knee 10/23/2019   Neuralgia, postherpetic 07/02/2019   OAB (overactive bladder) 07/02/2019   SSS (sick sinus syndrome) (HCC) 02/11/2019   Constipation 01/18/2019   Status post placement of cardiac pacemaker 07/08/2018   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   Nonrheumatic mitral valve regurgitation 09/16/2015   Nontoxic multinodular goiter 09/08/2015   Current use of long term anticoagulation 05/16/2015   Recurrent UTI 03/14/2015   (HFpEF) heart failure with preserved ejection fraction (HCC) 09/13/2014   Pacemaker 11/09/2011   Impaired fasting glucose 05/23/2009   Long term current use of systemic steroids 03/30/2008   Polymyalgia rheumatica 03/30/2008   Benign essential hypertension 05/05/2007   Persistent atrial fibrillation (HCC) 11/06/2006   Pure hypercholesterolemia 11/06/2006   Hypothyroidism due to Hashimoto's thyroiditis, Rx Levothyroxine  06/25/2005   PCP:  Jodie Lavern CROME, MD Pharmacy:   [facility] [id number] [place], [place] - [address] [place] [address] Phone: [phone] Fax: [phone]  [facility] - [place], [place] - [address] [place] [address] Phone: [phone] Fax: [phone]  [facility] [address] [place] [address] Phone: [phone] Fax: [phone]     Social Drivers of Health (SDOH) Social History: SDOH Screenings   Food Insecurity: No Food Insecurity (08/19/2024)  Housing: Low Risk (08/19/2024)  Transportation Needs: No Transportation Needs (08/19/2024)  Utilities: Not At Risk (08/19/2024)  Alcohol Screen: Low Risk (02/20/2024)  Depression (PHQ2-9): Low  Risk (05/28/2024)  Financial Resource Strain: Low Risk (02/20/2024)  Physical Activity: Sufficiently Active (02/20/2024)  Social Connections: Moderately Isolated (08/19/2024)  Stress: No Stress Concern Present (02/20/2024)  Tobacco Use: Medium Risk (08/18/2024)  Health Literacy: Adequate Health Literacy (02/20/2024)   SDOH Interventions:     Readmission Risk Interventions     No data to display

## 2024-08-22 NOTE — Plan of Care (Signed)
" °  Problem: Coping: Goal: Psychosocial and spiritual needs will be supported Outcome: Progressing   Problem: Respiratory: Goal: Complications related to the disease process, condition or treatment will be avoided or minimized Outcome: Progressing   Problem: Clinical Measurements: Goal: Ability to maintain clinical measurements within normal limits will improve Outcome: Progressing Goal: Diagnostic test results will improve Outcome: Progressing Goal: Respiratory complications will improve Outcome: Progressing Goal: Cardiovascular complication will be avoided Outcome: Progressing   "

## 2024-08-22 NOTE — Progress Notes (Signed)
 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 She was found to have COVID-19 infection along with acute diastolic CHF with hypoxia.   Assessment & Plan:   Principal Problem:   Acute hypoxic respiratory failure (HCC)   Acute hypoxic respiratory failure Acute infection with pneumonia with suspicion of upper imposed bacterial pneumonia -COVID 19 positive, with worsening hypoxia, chest x-ray with worsening pneumonia, requiring up to 8 L nasal cannula, improving, she is on 2 L oxygen this morning, she is on room air at baseline - continue with Medrol  40 mg IV twice daily (patient very hesitant of increasing steroids as causing increased anxiety but have informed her we need to increase the dose with worsening hypoxia and dyspnea. - CRP significantly elevated. - He was encouraged use incentive spirometry and flutter valve. -Mucinex . -encouraged with incentive spirometry and flutter valve. - on IV Rocephin  and azithromycin for bacterial coverage. - Given rapidly worsening hypoxia she was started on remdesivir   - her resp status improving, I have encouraged her to use incentive spirometer more today, she is down to 2 L oxygen today.  CRP is trending down which is reassuring -On coverage for bacterial pneumonia as well, patient is high risk for aspiration, but is the same for clear or thickened liquid, so we will continue at current diet, SLP input appreciated   Acute diastolic CHF with preserved EF, 65% Second-degree AV block has chronic pacemaker Moderate  MR Severe aortic stenosis - Echocardiogram in August 2025 showed EF 65% with dilated cardiomyopathy along with valvular disease. Class III symptoms.   -Received 60 mg of IV Lasix  in the ER.  Has elevated BNP.  Will closely monitor volume status-preload/afterload. - Will start on low-dose Lasix  and give extra dosing as needed, will give extra 40 mg of IV Lasix  today.  Elevated troponin - Demand ischemia.  Hypothermia -Will start on warming blanket   Severe anxiety and agitation - Add Zyprexa    Ongoing chronic outpatient dysphagia - Currently on dysphagia 3 diet.  Speech and swallow evaluation   Hypothyroidism - Synthroid    Polymyalgia rheumatica - Continue steroids, eventually will resume home p.o.   Paroxysmal atrial fibrillation - Continue home digoxin , beta-blocker and Eliquis , digoxin  level within normal limit   Essential hypertension - On hydralazine , Toprol .  IV as needed    DVT prophylaxis: ( Eliquis  ) Code Status: (Full) Family Communication: ( none at bedside ) with son by phone Disposition:   Status is: Inpatient    Consultants:   none  Subjective:  Dyspnea has improved today, she denies any chest pain  Objective: Vitals:   08/22/24 0957 08/22/24 1036 08/22/24 1125 08/22/24 1414  BP: 136/72  (!) 148/66 (!) 172/94  Pulse: 86 78 64   Resp:  (!) 22 20   Temp:   98.7 F (37.1 C)   TempSrc:   Axillary   SpO2:  100%    Weight:      Height:        Intake/Output Summary (Last 24 hours) at 08/22/2024 1507 Last data filed at 08/21/2024 2331 Gross per 24 hour  Intake  114.09 ml  Output --  Net 114.09 ml   Filed Weights   08/18/24 1155 08/22/24 0500  Weight: 52.2 kg 54.3 kg    Examination:  Awake Alert, frail, chronically ill-appearing Good air entry today, no wheezing RRR +ve B.Sounds, Abd Soft, No tenderness No edema, or extremity diminished pulses, with mild discoloration and bilateral feet but no signs of critical ischemia.   Data  Reviewed: I have personally reviewed following labs and imaging studies  CBC: Recent Labs  Lab 08/18/24 1312 08/19/24 0523 08/20/24 0352 08/21/24 0323 08/22/24 0451  WBC 12.3* 10.1 10.5 6.9 5.9  NEUTROABS  --  8.5* 9.3* 5.8 5.0  HGB 14.6 15.1* 15.3* 14.4 14.3  HCT 43.0 44.9 44.3 44.2 43.3  MCV 96.6 96.8 94.7 98.9 98.4  PLT 195 193 241 211 234    Basic Metabolic Panel: Recent Labs  Lab 08/18/24 1312 08/19/24 0523 08/20/24 0352 08/21/24 0323 08/22/24 0451  NA 135 138 137 141 140  K 4.0 4.3 3.9 3.7 4.0  CL 95* 98 96* 100 100  CO2 31 30 30 26  32  GLUCOSE 127* 118* 124* 113* 131*  BUN 20 24* 37* 48* 48*  CREATININE 0.39* 0.37* 0.45 0.47 0.37*  CALCIUM 10.7* 10.4* 11.1* 10.2 10.3  MG  --  2.1 2.3 2.2 2.2  PHOS  --   --  2.3* 4.0 2.7    GFR: Estimated Creatinine Clearance: 40.9 mL/min (A) (by C-G formula based on SCr of 0.37 mg/dL (L)).  Liver Function Tests: No results for input(s): AST, ALT, ALKPHOS, BILITOT, PROT, ALBUMIN in the last 168 hours.  CBG: No results for input(s): GLUCAP in the last 168 hours.   Recent Results (from the past 240 hours)  Resp panel by RT-PCR (RSV, Flu A&B, Covid) Anterior Nasal Swab     Status: Abnormal   Collection Time: 08/18/24  1:05 PM   Specimen: Anterior Nasal Swab  Result Value Ref Range Status   SARS Coronavirus 2 by RT PCR POSITIVE (A) NEGATIVE Final   Influenza A by PCR NEGATIVE NEGATIVE Final   Influenza B by PCR NEGATIVE NEGATIVE Final    Comment: (NOTE) The Xpert Xpress SARS-CoV-2/FLU/RSV plus assay is intended as an aid in the diagnosis of influenza from Nasopharyngeal swab specimens and should not be used as a sole basis for treatment. Nasal washings and aspirates are unacceptable for Xpert Xpress SARS-CoV-2/FLU/RSV testing.  Fact Sheet for Patients: bloggercourse.com  Fact Sheet for Healthcare Providers: seriousbroker.it  This test is not yet  approved or cleared by the [place]  FDA and has been authorized for detection and/or diagnosis of SARS-CoV-2 by FDA under an Emergency Use Authorization (EUA). This EUA will remain in effect (meaning this test can be used) for the duration of the COVID-19 declaration under Section 564(b)(1) of the Act, 21 U.S.C. section 360bbb-3(b)(1), unless the authorization is terminated or revoked.     Resp Syncytial Virus by PCR NEGATIVE NEGATIVE Final    Comment: (NOTE) Fact Sheet for Patients: bloggercourse.com  Fact Sheet for Healthcare Providers: seriousbroker.it  This test is not yet approved or cleared by the [place]  FDA and has been authorized for detection and/or diagnosis of SARS-CoV-2 by FDA under an Emergency Use Authorization (EUA). This EUA will remain in effect (meaning this test can be used) for the duration of the COVID-19 declaration under Section 564(b)(1) of the Act, 21 U.S.C. section 360bbb-3(b)(1), unless the authorization is terminated or revoked.  Performed at [facility], [address], [place], [place] [address]          Radiology Studies: DG Swallowing Func-Speech Pathology Result Date: 08/21/2024 Table formatting from the original result was not included. Images from the original result were not included. Modified Barium Swallow Study Patient Details Name: Deanna Rivera MRN: [id number] Date of Birth: [birth year] Today's Date: 08/21/2024 HPI/PMH: HPI: 89 yo female presenting to ED 1/6 from ILF with generalized weakness, fatigue, and shortness of breath. MBS 11/05/22 shows mild-moderate pharyngeal dysphagia that is thought to be chronic in nature related to degenerative changes and spurring in cervical vertebrae as well as generalized weakness. Persistent penetration, often to the vocal folds, occurred and elicited a frequent cough response with minimal aspiration. Function also impacted by significant  vallecular residue. Of note, OP MBS was scheduled for 09/14/24. CXR 1/8 shows significantly increased R middle lobe opacity, consistent with pneumonia. Supplemental O2 needs increased 1/8 to 8 LPM via [place] but returned to 3 LPM 1/9. PMH includes paroxysmal A-fib, second degree AV block s/p pacemaker, HTN, PMR, hypothyroidism, dysphagia, moderate MR Clinical Impression: Pt exhibits severe pharyngeal dysphagia, representing decreased function since previous MBS 10/2022. This is primarily caused by a prominent cricopharyngeus and degenerative changes of the cervical spine. Base of tongue retraction, anterior hyoid movement, and epiglottic inversion remain minimal, which contributes to decreased pharyngeal clearance and incomplete laryngeal closure. Thin and nectar thick liquids consistently enter the laryngeal vestibule and progress to the vocal folds during the swallow (PAS 5). Due to the amount of vallecular residue, the volume sitting on the vocal folds increases after the swallow and leads to aspiration of thin liquids after the swallow (PAS 7). This does not elicit a cough response and spontaneous throat clearance does not eject penetrates or aspirates. A chin tuck did not yield improved safety or efficiency. Discussed with MD and would recommend she continue current diet, using strategies to minimize the risk of adverse events. Will f/u to provide education to pt and her family. DIGEST Swallow Severity Rating*  Safety: 2  Efficiency: 3  Overall Pharyngeal Swallow Severity: 3 (severe) 1: mild; 2: moderate; 3: severe; 4: profound *The Dynamic Imaging Grade of Swallowing Toxicity is standardized for the head and neck cancer population, however, demonstrates promising clinical applications across populations to standardize the clinical rating of pharyngeal swallow safety and severity.  Factors that may increase risk of adverse event in presence of aspiration Noe & Lianne 2021): Factors that may increase risk of  adverse event in presence of aspiration Noe & Lianne 2021): Poor general health and/or compromised immunity; Reduced cognitive function; Limited mobility; Frail or deconditioned; Weak cough Recommendations/Plan: Swallowing Evaluation Recommendations Swallowing Evaluation Recommendations Recommendations: PO diet PO Diet Recommendation: Dysphagia 3 (Mechanical soft); Thin liquids (Level 0) Liquid Administration via: Cup; Straw Medication Administration: Crushed with puree Supervision: Staff to assist with self-feeding; Intermittent supervision/cueing for swallowing strategies Swallowing strategies  : Minimize environmental distractions; Slow rate; Small bites/sips; Multiple dry swallows after each bite/sip Postural changes: Position pt fully upright for meals; Stay upright 30-60 min after meals Oral care recommendations: Oral care BID (2x/day); Oral care before PO Recommended consults: Consider Palliative care Treatment Plan Treatment Plan Treatment recommendations: Therapy as outlined in treatment plan below Follow-up recommendations: Skilled nursing-short term rehab (<3 hours/day) Functional status assessment: Patient has had a recent decline in their functional status and demonstrates the ability to make significant improvements in function in a reasonable and predictable amount of time. Treatment frequency: Min 2x/week Treatment duration: 2 weeks Interventions: Aspiration precaution  training; Patient/family education; Diet toleration management by SLP Recommendations Recommendations for follow up therapy are one component of a multi-disciplinary discharge planning process, led by the attending physician.  Recommendations may be updated based on patient status, additional functional criteria and insurance authorization. Assessment: Orofacial Exam: Orofacial Exam Oral Cavity: Oral Hygiene: WFL Oral Cavity - Dentition: Adequate natural dentition Orofacial Anatomy: WFL Oral Motor/Sensory Function: WFL Anatomy:  Anatomy: Suspected cervical osteophytes; Prominent cricopharyngeus Boluses Administered: Boluses Administered Boluses Administered: Thin liquids (Level 0); Mildly thick liquids (Level 2, nectar thick); Puree; Solid  Oral Impairment Domain: Oral Impairment Domain Lip Closure: No labial escape Tongue control during bolus hold: Posterior escape of less than half of bolus Bolus preparation/mastication: Slow prolonged chewing/mashing with complete recollection Bolus transport/lingual motion: Brisk tongue motion Oral residue: Trace residue lining oral structures Location of oral residue : Tongue; Palate Initiation of pharyngeal swallow : Posterior angle of the ramus  Pharyngeal Impairment Domain: Pharyngeal Impairment Domain Soft palate elevation: Trace column of contrast or air between SP and PW Laryngeal elevation: Partial superior movement of thyroid  cartilage/partial approximation of arytenoids to epiglottic petiole Anterior hyoid excursion: No anterior movement Epiglottic movement: No inversion Laryngeal vestibule closure: Incomplete, narrow column air/contrast in laryngeal vestibule Pharyngeal stripping wave : Present - diminished Pharyngeal contraction (A/P view only): N/A Pharyngoesophageal segment opening: Minimal distention/minimal duration, marked obstruction of flow Tongue base retraction: Narrow column of contrast or air between tongue base and PPW Pharyngeal residue: Majority of contrast within or on pharyngeal structures Location of pharyngeal residue: Tongue base; Valleculae; Pharyngeal wall; Pyriform sinuses; Diffuse (>3 areas)  Esophageal Impairment Domain: Esophageal Impairment Domain Esophageal clearance upright position: Esophageal retention Pill: No data recorded Penetration/Aspiration Scale Score: Penetration/Aspiration Scale Score 2.  Material enters airway, remains ABOVE vocal cords then ejected out: Puree; Solid 5.  Material enters airway, CONTACTS cords and not ejected out: Mildly thick liquids  (Level 2, nectar thick) 7.  Material enters airway, passes BELOW cords and not ejected out despite cough attempt by patient: Thin liquids (Level 0) Compensatory Strategies: Compensatory Strategies Compensatory strategies: Yes Chin tuck: Ineffective Ineffective Chin Tuck: Thin liquid (Level 0); Mildly thick liquid (Level 2, nectar thick) Liquid wash: Ineffective Ineffective Liquid Wash: Thin liquid (Level 0); Mildly thick liquid (Level 2, nectar thick)   General Information: Caregiver present: No  Diet Prior to this Study: Dysphagia 3 (mechanical soft); Thin liquids (Level 0)   Temperature : Normal   Respiratory Status: WFL   Supplemental O2: Nasal cannula   History of Recent Intubation: No  Behavior/Cognition: Alert; Cooperative Self-Feeding Abilities: Needs set-up for self-feeding Baseline vocal quality/speech: Normal Volitional Cough: Able to elicit Volitional Swallow: Able to elicit Exam Limitations: No limitations Goal Planning: Prognosis for improved oropharyngeal function: Fair Barriers to Reach Goals: Time post onset; Severity of deficits; Behavior No data recorded Patient/Family Stated Goal: none stated Consulted and agree with results and recommendations: Patient; Physician Pain: Pain Assessment Pain Assessment: No/denies pain End of Session: Start Time:SLP Start Time (ACUTE ONLY): 1507 Stop Time: SLP Stop Time (ACUTE ONLY): 1529 Time Calculation:SLP Time Calculation (min) (ACUTE ONLY): 22 min Charges: SLP Evaluations $ SLP Speech Visit: 1 Visit SLP Evaluations $MBS Swallow: 1 Procedure $Swallowing Treatment: 1 Procedure SLP visit diagnosis: SLP Visit Diagnosis: Dysphagia, pharyngeal phase (R13.13) Past Medical History: Past Medical History: Diagnosis Date  Cataract   Hypertension   Moderate aortic stenosis   Moderate mitral regurgitation   Persistent atrial fibrillation (HCC)   PMR (polymyalgia rheumatica)   Second degree AV  block   Sick sinus syndrome (HCC)   Thyroid  disease  Past Surgical History: Past  Surgical History: Procedure Laterality Date  ABDOMINAL HYSTERECTOMY    APPENDECTOMY    CESAREAN SECTION    x 3  CHOLECYSTECTOMY    PACEMAKER GENERATOR CHANGE  11/20/2018  [employer] Accolade MRI PPM implanted in [place]   PACEMAKER IMPLANT    PILONIDAL CYST EXCISION   [name], M.A., CCC-SLP Speech Language Pathology, Acute Rehabilitation Services Secure Chat preferred [phone] 08/21/2024, 4:18 PM       Scheduled Meds:  acetaminophen   650 mg Rectal Once   apixaban   2.5 mg Oral BID   digoxin   125 mcg Oral Daily   dorzolamide -timolol   1 drop Both Eyes BID   furosemide   20 mg Intravenous Daily   guaiFENesin   1,200 mg Oral BID   hydrALAZINE   25 mg Oral Q8H   latanoprost   1 drop Both Eyes QHS   levothyroxine   125 mcg Oral Daily   methylPREDNISolone  (SOLU-MEDROL ) injection  40 mg Intravenous Q12H   metoprolol  succinate  12.5 mg Oral Daily   midodrine   5 mg Oral TID WC   OLANZapine   2.5 mg Oral Q8H   Continuous Infusions:  cefTRIAXone  (ROCEPHIN )  IV 2 g (08/22/24 1214)   doxycycline  (VIBRAMYCIN ) IV 100 mg (08/22/24 1211)     LOS: 4 days      Brayton Lye, MD [facility]   To contact the attending provider between 7A-7P or the covering provider during after hours 7P-7A, please log into the web site [email or web address] and access using universal [place] password for that web site. If you do not have the password, please call the hospital operator.  08/22/2024, 3:07 PM   "

## 2024-08-22 NOTE — Progress Notes (Signed)
 Physical Therapy Treatment Patient Details Name: Deanna Rivera MRN: [id number] DOB: Dec 25, 1932 Today's Date: 08/22/2024   History of Present Illness Pt is 89 year old presented to [facility] on  08/18/24 for weakness and SOB. Pt with covid and acute CHF.  PMH - heart block with pacer, permanent atrial fibrillation on Eliquis , hypothyroidism, inclusion body myositis, HTN, and recent influenza A infection    PT Comments  Pt able to tolerate OOB to chair. Needed use of Stedy and mod to max assist to stand. Lives in ILF with husband and has a caregiver 3 hrs/day. Pt does not want to go to SNF. Son understands that pt would need caregiver all day. Caregiver will need to be able to physically assist pt with transfers and bed mobility. Would maximize HH services if she chooses to return home.     If plan is discharge home, recommend the following: A lot of help with walking and/or transfers;A lot of help with bathing/dressing/bathroom   Can travel by private vehicle     No  Equipment Recommendations  None recommended by PT    Recommendations for Other Services       Precautions / Restrictions Precautions Precautions: Fall Recall of Precautions/Restrictions: Impaired Restrictions Weight Bearing Restrictions Per Provider Order: No     Mobility  Bed Mobility Overal bed mobility: Needs Assistance Bed Mobility: Supine to Sit     Supine to sit: Mod assist, HOB elevated     General bed mobility comments: Assist to elevate trunk into sitting and bring hips to EOB    Transfers Overall transfer level: Needs assistance Equipment used: 1 person hand held assist, Ambulation equipment used Transfers: Sit to/from Stand, Bed to chair/wheelchair/BSC Sit to Stand: Max assist, Mod assist, From elevated surface           General transfer comment: Max assist to stand from bed using bilateral shelf arm support. Posterior lean/propping on bed with posterior LE's. Unable to attempt pivot transfer. Mod  assist to stand from elevated bed with Stedy. Stedy for bed to recliner. Transfer via Lift Equipment: Stedy  Ambulation/Gait               General Gait Details: Non ambulatory at baseline   Stairs             Wheelchair Mobility     Tilt Bed    Modified Rankin (Stroke Patients Only)       Balance Overall balance assessment: Needs assistance Sitting-balance support: Bilateral upper extremity supported, Feet supported Sitting balance-Leahy Scale: Poor Sitting balance - Comments: UE support Postural control: Posterior lean   Standing balance-Leahy Scale: Zero Standing balance comment: Max assist to stand                            Communication Communication Communication: Impaired Factors Affecting Communication: Hearing impaired  Cognition Arousal: Alert Behavior During Therapy: WFL for tasks assessed/performed, Anxious   PT - Cognitive impairments: Attention, Memory, Awareness                         Following commands: Impaired Following commands impaired: Only follows one step commands consistently    Cueing Cueing Techniques: Verbal cues, Tactile cues  Exercises      General Comments General comments (skin integrity, edema, etc.): SpO2 93-95% on 2L O2      Pertinent Vitals/Pain Pain Assessment Pain Assessment: No/denies pain    Home Living  Prior Function            PT Goals (current goals can now be found in the care plan section) Acute Rehab PT Goals Patient Stated Goal: go home Progress towards PT goals: Progressing toward goals    Frequency    Min 2X/week      PT Plan      Co-evaluation              AM-PAC PT 6 Clicks Mobility   Outcome Measure  Help needed turning from your back to your side while in a flat bed without using bedrails?: A Little Help needed moving from lying on your back to sitting on the side of a flat bed without using bedrails?: A  Lot Help needed moving to and from a bed to a chair (including a wheelchair)?: Total Help needed standing up from a chair using your arms (e.g., wheelchair or bedside chair)?: A Lot Help needed to walk in hospital room?: Total Help needed climbing 3-5 steps with a railing? : Total 6 Click Score: 10    End of Session Equipment Utilized During Treatment: Gait belt;Oxygen Activity Tolerance: Patient limited by fatigue Patient left: in chair;with call bell/phone within reach;with chair alarm set;with family/visitor present Nurse Communication: Mobility status;Need for lift equipment PT Visit Diagnosis: Unsteadiness on feet (R26.81);Other abnormalities of gait and mobility (R26.89);Muscle weakness (generalized) (M62.81)     Time: 8484-8450 PT Time Calculation (min) (ACUTE ONLY): 34 min  Charges:    $Therapeutic Activity: 23-37 mins PT General Charges $$ ACUTE PT VISIT: 1 Visit                     [facility] PT Acute Rehabilitation Services Office [phone]    [place] ORN [facility] 08/22/2024, 4:26 PM

## 2024-08-23 DIAGNOSIS — J9601 Acute respiratory failure with hypoxia: Secondary | ICD-10-CM | POA: Diagnosis not present

## 2024-08-23 LAB — CBC WITH DIFFERENTIAL/PLATELET
Abs Immature Granulocytes: 0.07 K/uL (ref 0.00–0.07)
Basophils Absolute: 0 K/uL (ref 0.0–0.1)
Basophils Relative: 0 %
Eosinophils Absolute: 0 K/uL (ref 0.0–0.5)
Eosinophils Relative: 0 %
HCT: 47.4 % — ABNORMAL HIGH (ref 36.0–46.0)
Hemoglobin: 15.9 g/dL — ABNORMAL HIGH (ref 12.0–15.0)
Immature Granulocytes: 1 %
Lymphocytes Relative: 10 %
Lymphs Abs: 0.7 K/uL (ref 0.7–4.0)
MCH: 32.5 pg (ref 26.0–34.0)
MCHC: 33.5 g/dL (ref 30.0–36.0)
MCV: 96.9 fL (ref 80.0–100.0)
Monocytes Absolute: 0.7 K/uL (ref 0.1–1.0)
Monocytes Relative: 10 %
Neutro Abs: 5.7 K/uL (ref 1.7–7.7)
Neutrophils Relative %: 79 %
Platelets: 246 K/uL (ref 150–400)
RBC: 4.89 MIL/uL (ref 3.87–5.11)
RDW: 13.2 % (ref 11.5–15.5)
WBC: 7.2 K/uL (ref 4.0–10.5)
nRBC: 0 % (ref 0.0–0.2)

## 2024-08-23 LAB — BASIC METABOLIC PANEL WITH GFR
Anion gap: 9 (ref 5–15)
BUN: 41 mg/dL — ABNORMAL HIGH (ref 8–23)
CO2: 33 mmol/L — ABNORMAL HIGH (ref 22–32)
Calcium: 10.7 mg/dL — ABNORMAL HIGH (ref 8.9–10.3)
Chloride: 100 mmol/L (ref 98–111)
Creatinine, Ser: 0.4 mg/dL — ABNORMAL LOW (ref 0.44–1.00)
GFR, Estimated: >60 mL/min
Glucose, Bld: 127 mg/dL — ABNORMAL HIGH (ref 70–99)
Potassium: 3.6 mmol/L (ref 3.5–5.1)
Sodium: 142 mmol/L (ref 135–145)

## 2024-08-23 LAB — MAGNESIUM: Magnesium: 2.1 mg/dL (ref 1.7–2.4)

## 2024-08-23 LAB — PROCALCITONIN: Procalcitonin: 0.11 ng/mL

## 2024-08-23 LAB — GLUCOSE, CAPILLARY: Glucose-Capillary: 165 mg/dL — ABNORMAL HIGH (ref 70–99)

## 2024-08-23 LAB — PHOSPHORUS: Phosphorus: 1.9 mg/dL — ABNORMAL LOW (ref 2.5–4.6)

## 2024-08-23 LAB — PRO BRAIN NATRIURETIC PEPTIDE: Pro Brain Natriuretic Peptide: 3450 pg/mL — ABNORMAL HIGH

## 2024-08-23 MED ORDER — POTASSIUM CHLORIDE CRYS ER 20 MEQ PO TBCR
40.0000 meq | EXTENDED_RELEASE_TABLET | Freq: Once | ORAL | Status: AC
  Administered 2024-08-23: 40 meq via ORAL
  Filled 2024-08-23: qty 2

## 2024-08-23 MED ORDER — METOPROLOL TARTRATE 12.5 MG HALF TABLET
12.5000 mg | ORAL_TABLET | Freq: Once | ORAL | Status: AC
  Administered 2024-08-23: 12.5 mg via ORAL
  Filled 2024-08-23: qty 1

## 2024-08-23 MED ORDER — FUROSEMIDE 10 MG/ML IJ SOLN
40.0000 mg | Freq: Two times a day (BID) | INTRAMUSCULAR | Status: DC
  Administered 2024-08-23 – 2024-08-24 (×3): 40 mg via INTRAVENOUS
  Filled 2024-08-23 (×3): qty 4

## 2024-08-23 MED ORDER — K PHOS MONO-SOD PHOS DI & MONO 155-852-130 MG PO TABS
500.0000 mg | ORAL_TABLET | Freq: Three times a day (TID) | ORAL | Status: AC
  Administered 2024-08-23 (×2): 500 mg via ORAL
  Filled 2024-08-23 (×3): qty 2

## 2024-08-23 MED ORDER — PREDNISONE 20 MG PO TABS
40.0000 mg | ORAL_TABLET | Freq: Every day | ORAL | Status: DC
  Administered 2024-08-23: 40 mg via ORAL
  Filled 2024-08-23 (×2): qty 2

## 2024-08-23 NOTE — Progress Notes (Signed)
 Pt continuously refuses to take her medication. When asked if she will take it, she says yes.However,after the medication is crushed and prepared she says no I've taken that already. She is very adamant about not taking her meds, even when explained the importance. She also is very tearful while stating she is going to die .

## 2024-08-23 NOTE — Progress Notes (Signed)
 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 She was found to have COVID-19 infection along with acute diastolic CHF with hypoxia.   Assessment & Plan:   Principal Problem:   Acute hypoxic respiratory failure (HCC)   Acute hypoxic respiratory failure Acute infection with pneumonia with suspicion of upper imposed bacterial pneumonia -COVID 19 positive, with worsening hypoxia, chest x-ray with worsening pneumonia, requiring up to 8 L nasal cannula, improving, she is on 2 L oxygen this morning, she is on room air at baseline - continue with Medrol  40 mg IV twice daily (patient very hesitant of increasing steroids as causing increased anxiety but have informed her we need to increase the dose with worsening hypoxia and dyspnea. - CRP significantly elevated. - He was encouraged use incentive spirometry and flutter valve. -Mucinex . -encouraged with incentive spirometry and flutter valve. - on IV Rocephin  and azithromycin for bacterial coverage. - Given rapidly worsening hypoxia she was started on remdesivir   - her resp status improving, I have encouraged her to use incentive spirometer more today, she remains requiring up to 2 L nasal cannula. -On coverage for bacterial pneumonia as well, patient is high risk for aspiration, but is the same for clear or thickened liquid, so we will continue at current diet, SLP input appreciated   Acute diastolic CHF with preserved EF, 65% Second-degree AV block has chronic pacemaker Moderate MR Severe aortic stenosis -  Echocardiogram in August 2025 showed EF 65% with dilated cardiomyopathy along with valvular disease. Class III symptoms.   -Received 60 mg of IV Lasix  in the ER.  Has elevated BNP.  Will closely monitor volume status-preload/afterload. - Significant evidence of volume overload today, will increase her Lasix  to 40 mg IV twice daily  - With multiple PVCs today, she was started on midodrine  yesterday to assist with blood pressure and room for diuresis, so we will discontinue midodrine   Elevated troponin - Demand ischemia.  Hypothermia -Will start on warming blanket   Severe anxiety and agitation - Add Zyprexa    Ongoing chronic outpatient dysphagia - Currently on dysphagia 3 diet.  Speech and swallow evaluation   Hypothyroidism - Synthroid    Polymyalgia rheumatica - Continue steroids, eventually will resume home p.o.   Paroxysmal atrial fibrillation - Continue home digoxin , beta-blocker and Eliquis , digoxin  level within normal limit   Essential hypertension - On hydralazine , Toprol .  IV as needed  Goals of care -I have discussed with son at bedside, overall medical condition, frailty and severe deconditioning, patient would be a good candidate for hospice at home    DVT prophylaxis: ( Eliquis  ) Code Status: (Full) Family Communication: (Discussed with son and daughter in law at bedside Disposition:   Status is: Inpatient    Consultants:   none  Subjective:  No significant events overnight discussed with staff  Objective: Vitals:   08/23/24 1000 08/23/24 1014 08/23/24 1139 08/23/24 1514  BP: (!) 184/87 (!) 184/87 (!) 151/67 (!) 143/50  Pulse:  76 (!) 106 78  Resp:   20   Temp:      TempSrc:  SpO2:   95%   Weight:      Height:        Intake/Output Summary (Last 24 hours) at 08/23/2024 1604 Last data filed at 08/23/2024 0742 Gross per 24 hour  Intake 647.46 ml  Output 1300 ml  Net -652.54 ml   Filed Weights   08/18/24 1155 08/22/24 0500 08/23/24 0500   Weight: 52.2 kg 54.3 kg 54.2 kg    Examination:  Awake, alert, chronically ill appearing, frail, confused today  She is with diminished air entry at the bases but no wheezing  irregular  +ve B.Sounds, Abd Soft, No tenderness Edema, with significant bilateral feet venous insufficiency and discoloration    Data Reviewed: I have personally reviewed following labs and imaging studies  CBC: Recent Labs  Lab 08/19/24 0523 08/20/24 0352 08/21/24 0323 08/22/24 0451 08/23/24 0429  WBC 10.1 10.5 6.9 5.9 7.2  NEUTROABS 8.5* 9.3* 5.8 5.0 5.7  HGB 15.1* 15.3* 14.4 14.3 15.9*  HCT 44.9 44.3 44.2 43.3 47.4*  MCV 96.8 94.7 98.9 98.4 96.9  PLT 193 241 211 234 246    Basic Metabolic Panel: Recent Labs  Lab 08/19/24 0523 08/20/24 0352 08/21/24 0323 08/22/24 0451 08/23/24 0429  NA 138 137 141 140 142  K 4.3 3.9 3.7 4.0 3.6  CL 98 96* 100 100 100  CO2 30 30 26  32 33*  GLUCOSE 118* 124* 113* 131* 127*  BUN 24* 37* 48* 48* 41*  CREATININE 0.37* 0.45 0.47 0.37* 0.40*  CALCIUM 10.4* 11.1* 10.2 10.3 10.7*  MG 2.1 2.3 2.2 2.2 2.1  PHOS  --  2.3* 4.0 2.7 1.9*    GFR: Estimated Creatinine Clearance: 40.8 mL/min (A) (by C-G formula based on SCr of 0.4 mg/dL (L)).  Liver Function Tests: No results for input(s): AST, ALT, ALKPHOS, BILITOT, PROT, ALBUMIN in the last 168 hours.  CBG: No results for input(s): GLUCAP in the last 168 hours.   Recent Results (from the past 240 hours)  Resp panel by RT-PCR (RSV, Flu A&B, Covid) Anterior Nasal Swab     Status: Abnormal   Collection Time: 08/18/24  1:05 PM   Specimen: Anterior Nasal Swab  Result Value Ref Range Status   SARS Coronavirus 2 by RT PCR POSITIVE (A) NEGATIVE Final   Influenza A by PCR NEGATIVE NEGATIVE Final   Influenza B by PCR NEGATIVE NEGATIVE Final    Comment: (NOTE) The Xpert Xpress SARS-CoV-2/FLU/RSV plus assay is intended as an aid in the diagnosis of influenza from Nasopharyngeal swab specimens  and should not be used as a sole basis for treatment. Nasal washings and aspirates are unacceptable for Xpert Xpress SARS-CoV-2/FLU/RSV testing.  Fact Sheet for Patients: bloggercourse.com  Fact Sheet for Healthcare Providers: seriousbroker.it  This test is not yet approved or cleared by the [place]  FDA and has been authorized for detection and/or diagnosis of SARS-CoV-2 by FDA under an Emergency Use Authorization (EUA). This EUA will remain in effect (meaning this test can be used) for the duration of the COVID-19 declaration under Section 564(b)(1) of the Act, 21 U.S.C. section 360bbb-3(b)(1), unless the authorization is terminated or revoked.     Resp Syncytial Virus by PCR NEGATIVE NEGATIVE Final    Comment: (NOTE) Fact Sheet for Patients: bloggercourse.com  Fact Sheet for Healthcare Providers: seriousbroker.it  This test is not yet approved or cleared by the [place]  FDA and has been authorized for detection and/or diagnosis of SARS-CoV-2 by FDA under an Emergency Use Authorization (EUA). This EUA will  remain in effect (meaning this test can be used) for the duration of the COVID-19 declaration under Section 564(b)(1) of the Act, 21 U.S.C. section 360bbb-3(b)(1), unless the authorization is terminated or revoked.  Performed at [facility], [address] [place], [place] [address]          Radiology Studies: No results found.       Scheduled Meds:  acetaminophen   650 mg Rectal Once   apixaban   2.5 mg Oral BID   digoxin   125 mcg Oral Daily   dorzolamide -timolol   1 drop Both Eyes BID   furosemide   40 mg Intravenous BID   guaiFENesin   1,200 mg Oral BID   hydrALAZINE   25 mg Oral Q8H   latanoprost   1 drop Both Eyes QHS   levothyroxine   125 mcg Oral Daily   metoprolol  succinate  12.5 mg Oral Daily   pantoprazole   40 mg Oral Daily    phosphorus  500 mg Oral TID   predniSONE   40 mg Oral Q breakfast   Continuous Infusions:  cefTRIAXone  (ROCEPHIN )  IV 2 g (08/23/24 1116)   doxycycline  (VIBRAMYCIN ) IV 100 mg (08/23/24 1121)     LOS: 5 days      Brayton Lye, MD [facility]   To contact the attending provider between 7A-7P or the covering provider during after hours 7P-7A, please log into the web site [email or web address] and access using universal [place] password for that web site. If you do not have the password, please call the hospital operator.  08/23/2024, 4:04 PM   "

## 2024-08-23 NOTE — Plan of Care (Signed)
  Problem: Respiratory: Goal: Will maintain a patent airway Outcome: Progressing   Problem: Clinical Measurements: Goal: Cardiovascular complication will be avoided Outcome: Progressing   Problem: Elimination: Goal: Will not experience complications related to bowel motility Outcome: Progressing Goal: Will not experience complications related to urinary retention Outcome: Progressing   Problem: Safety: Goal: Ability to remain free from injury will improve Outcome: Progressing

## 2024-08-24 ENCOUNTER — Other Ambulatory Visit (HOSPITAL_COMMUNITY): Payer: Self-pay

## 2024-08-24 LAB — CBC
HCT: 48.5 % — ABNORMAL HIGH (ref 36.0–46.0)
Hemoglobin: 15.9 g/dL — ABNORMAL HIGH (ref 12.0–15.0)
MCH: 32.2 pg (ref 26.0–34.0)
MCHC: 32.8 g/dL (ref 30.0–36.0)
MCV: 98.2 fL (ref 80.0–100.0)
Platelets: 202 K/uL (ref 150–400)
RBC: 4.94 MIL/uL (ref 3.87–5.11)
RDW: 13.3 % (ref 11.5–15.5)
WBC: 7.8 K/uL (ref 4.0–10.5)
nRBC: 0 % (ref 0.0–0.2)

## 2024-08-24 LAB — BASIC METABOLIC PANEL WITH GFR
Anion gap: 11 (ref 5–15)
BUN: 34 mg/dL — ABNORMAL HIGH (ref 8–23)
CO2: 36 mmol/L — ABNORMAL HIGH (ref 22–32)
Calcium: 10.6 mg/dL — ABNORMAL HIGH (ref 8.9–10.3)
Chloride: 99 mmol/L (ref 98–111)
Creatinine, Ser: 0.44 mg/dL (ref 0.44–1.00)
GFR, Estimated: >60 mL/min
Glucose, Bld: 120 mg/dL — ABNORMAL HIGH (ref 70–99)
Potassium: 3.4 mmol/L — ABNORMAL LOW (ref 3.5–5.1)
Sodium: 146 mmol/L — ABNORMAL HIGH (ref 135–145)

## 2024-08-24 LAB — MAGNESIUM: Magnesium: 2.1 mg/dL (ref 1.7–2.4)

## 2024-08-24 MED ORDER — ACETAMINOPHEN 325 MG PO TABS: 650.0000 mg | ORAL_TABLET | Freq: Four times a day (QID) | ORAL | Status: DC | PRN

## 2024-08-24 MED ORDER — PANTOPRAZOLE SODIUM 40 MG PO TBEC
40.0000 mg | DELAYED_RELEASE_TABLET | Freq: Every day | ORAL | 0 refills | Status: DC
  Filled 2024-08-24: qty 30, 30d supply, fill #0

## 2024-08-24 MED ORDER — IPRATROPIUM-ALBUTEROL 0.5-2.5 (3) MG/3ML IN SOLN
3.0000 mL | RESPIRATORY_TRACT | 0 refills | Status: DC | PRN
  Filled 2024-08-24: qty 90, 5d supply, fill #0

## 2024-08-24 MED ORDER — POTASSIUM CHLORIDE CRYS ER 20 MEQ PO TBCR
20.0000 meq | EXTENDED_RELEASE_TABLET | Freq: Every day | ORAL | 0 refills | Status: DC
  Filled 2024-08-24: qty 30, 30d supply, fill #0

## 2024-08-24 MED ORDER — PREDNISONE 10 MG (21) PO TBPK
ORAL_TABLET | ORAL | 0 refills | Status: DC
  Filled 2024-08-24: qty 21, 6d supply, fill #0

## 2024-08-24 MED ORDER — DOXYCYCLINE HYCLATE 100 MG PO TABS
100.0000 mg | ORAL_TABLET | Freq: Two times a day (BID) | ORAL | 0 refills | Status: AC
  Filled 2024-08-24: qty 10, 5d supply, fill #0

## 2024-08-24 MED ORDER — METHYLPREDNISOLONE SODIUM SUCC 40 MG IJ SOLR
40.0000 mg | Freq: Once | INTRAMUSCULAR | Status: AC
  Administered 2024-08-24: 40 mg via INTRAVENOUS
  Filled 2024-08-24: qty 1

## 2024-08-24 MED ORDER — TORSEMIDE 20 MG PO TABS
ORAL_TABLET | ORAL | 0 refills | Status: DC
  Filled 2024-08-24: qty 120, 40d supply, fill #0

## 2024-08-24 NOTE — Progress Notes (Addendum)
 Toc meds delivered to [facility] at [phone].

## 2024-08-24 NOTE — Progress Notes (Addendum)
 Contacted pt's son, kevin, about [facility] picking pt's up.   Amado GORMAN Arabia, RN

## 2024-08-24 NOTE — Progress Notes (Signed)
 Placed TOC meds and pt's belongings into pt's belonging bag and put it at the bedside for [facility] picking up.   Amado GORMAN Arabia, RN

## 2024-08-24 NOTE — Plan of Care (Signed)
" °  Problem: Education: Goal: Knowledge of risk factors and measures for prevention of condition will improve Outcome: Not Progressing   Problem: Coping: Goal: Psychosocial and spiritual needs will be supported Outcome: Progressing   Problem: Education: Goal: Knowledge of General Education information will improve Description: Including pain rating scale, medication(s)/side effects and non-pharmacologic comfort measures Outcome: Not Progressing   Problem: Clinical Measurements: Goal: Respiratory complications will improve Outcome: Progressing   Problem: Activity: Goal: Risk for activity intolerance will decrease Outcome: Progressing   "

## 2024-08-24 NOTE — TOC Transition Note (Signed)
 Transition of Care [facility]) - Discharge Note   Patient Details  Name: Deanna Rivera MRN: [id number] Date of Birth: 20-Jul-1933  [facility] CM/SW Contact:  Landry DELENA Senters, RN Phone Number: 08/24/2024, 3:33 PM   Clinical Narrative:    Patient will be discharging home with family and 24/7 caregivers, discharging with [facility] hospice. [facility] will be providing transportation home.   DME arranged through [facility] - CM did call son and confirm this has been delivered before arranging transportation for d/c.   No further needs identified by CM.   Final next level of care: Home/Self Care Barriers to Discharge: No Barriers Identified   Patient Goals and CMS Choice            Discharge Placement                       Discharge Plan and Services Additional resources added to the After Visit Summary for   In-house Referral: Clinical Social Work                                   Social Drivers of Health (SDOH) Interventions SDOH Screenings   Food Insecurity: No Food Insecurity (08/19/2024)  Housing: Low Risk (08/19/2024)  Transportation Needs: No Transportation Needs (08/19/2024)  Utilities: Not At Risk (08/19/2024)  Alcohol Screen: Low Risk (02/20/2024)  Depression (PHQ2-9): Low Risk (05/28/2024)  Financial Resource Strain: Low Risk (02/20/2024)  Physical Activity: Sufficiently Active (02/20/2024)  Social Connections: Moderately Isolated (08/19/2024)  Stress: No Stress Concern Present (02/20/2024)  Tobacco Use: Medium Risk (08/18/2024)  Health Literacy: Adequate Health Literacy (02/20/2024)     Readmission Risk Interventions     No data to display

## 2024-08-24 NOTE — Discharge Summary (Signed)
 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 She was found to have COVID-19 infection along with acute diastolic CHF with hypoxia.    Acute hypoxic respiratory failure Acute infection with pneumonia with suspicion of upper imposed bacterial pneumonia -COVID 19 positive, with worsening hypoxia, chest x-ray with worsening pneumonia, requiring up to 8 L nasal cannula, has been gradually improving, she is currently requiring 2 L nasal cannula at rest which will be arranged at discharge . - She was treated with IV steroids during hospital stay, she will discharge on prednisone  taper and then to continue with her home dose prednisone  . - Treated with antibiotics due to concern for bacterial pneumonia as well, to discharge on  -doxycycline  as well.   - Was treated with remdesivir  as CRP significantly elevated, significant oxygen requirement and worsening pneumonia on imaging, finish total of 3 days, currently oxygen requirement much improved. - patient is high risk for aspiration, but is the same for clear or thickened liquid, so we will continue at current diet, SLP input appreciated   Acute diastolic CHF with preserved EF, 65% Second-degree AV block has chronic pacemaker Moderate MR Severe aortic stenosis -  Echocardiogram in August 2025 showed EF 65% with dilated cardiomyopathy along with valvular disease. Class III symptoms.   - Patient required significant IV diuresis, given significant volume overload, she will be discharged on 40 mg of oral torsemide  daily and as needed evening torsemide  instead of Lasix  given significant IV diuresis required.   - With multiple PVCs started on midodrine  so it was discontinued.     Elevated troponin - Demand ischemia.   Hypothermia -Resolved    Severe anxiety and agitation -Due to hospital delirium   Ongoing chronic outpatient dysphagia - Currently on dysphagia 3 diet.  Speech and swallow evaluation   Hypothyroidism - Synthroid    Polymyalgia rheumatica - Continue steroids,   Paroxysmal atrial fibrillation - Continue home digoxin , beta-blocker and Eliquis , digoxin  level within normal limit   Essential hypertension - On hydralazine , Toprol .   Goals of care -I have discussed with son at bedside, patient with poor baseline wheelchair pendant, and recently more in the bed, extremely frail, weak, deconditioned, mainly severe aortic stenosis not a surgical candidate, they have considered hospice in the past, so currently plan to discharge home with hospice.    Discharge Diagnoses:  Principal Problem:   Acute hypoxic respiratory failure [facility])    Discharge Instructions  Discharge Instructions     Discharge instructions   Complete by: As directed    Increase activity slowly   Complete by: As directed    No wound care   Complete by: As directed       Allergies as of 08/24/2024       Reactions   Shellfish Protein-containing Drug Products Shortness Of Breath, Other (See Comments)   Lips go numb   Epinephrine  Other (See Comments), Hypertension   Makes me feel wired   Monosodium Glutamate  Other (See Comments), Hypertension   Makes me feel badly   Ciprofloxacin Other (See Comments)   Allergic reaction not recalled Intolerance    Dronedarone Other (See Comments)   Leg swelling, Leg swelling   Erythromycin Other (See Comments)   Allergic reaction not recalled   Sulfa Antibiotics Other (See Comments)   Allergic reaction not recalled   Sulfamethoxazole-trimethoprim    Tetracyclines & Related Other (See Comments)   Allergic reaction not recalled        Medication List     STOP taking these medications    furosemide  20 MG tablet Commonly known as: LASIX        TAKE these medications    acetaminophen  325 MG tablet Commonly known as: TYLENOL  Take 2 tablets (650 mg total) by mouth every 6 (six) hours as needed for mild pain (pain score 1-3) or fever (or Fever >/= 101).   digoxin  0.125 MG tablet Commonly known as: LANOXIN  TAKE 1 TABLET BY MOUTH DAILY   dorzolamide -timolol  2-0.5 % ophthalmic solution Commonly known as: COSOPT  Place 1 drop into both eyes 2 (two) times daily.   doxycycline  100 MG tablet Commonly known as: VIBRA -TABS Take 1 tablet (100 mg total) by mouth 2 (two) times daily for 5 days.   Eliquis  2.5 MG Tabs tablet Generic drug: apixaban  TAKE 1 TABLET BY MOUTH TWICE  DAILY   ipratropium-albuterol  0.5-2.5 (3) MG/3ML Soln Commonly known as: DUONEB Take 3 mLs by nebulization every 4 (four) hours as needed.   latanoprost  0.005 % ophthalmic solution Commonly known as: XALATAN  Place 1 drop into both eyes at bedtime.   levothyroxine  125 MCG tablet Commonly known as: SYNTHROID  TAKE 1 TABLET BY MOUTH DAILY   metoprolol  succinate 25 MG 24 hr tablet Commonly known as: TOPROL -XL TAKE ONE-HALF TABLET BY MOUTH  DAILY   pantoprazole  40 MG tablet Commonly known as: PROTONIX  Take 1 tablet (40 mg total) by mouth daily. Start taking on: August 25, 2024   polyethylene glycol 17 g packet Commonly known as: MIRALAX  / GLYCOLAX  Take 17 g by mouth daily as needed for severe constipation.   potassium chloride  SA 20 MEQ tablet Commonly known as: KLOR-CON  M Take 1 tablet (20 mEq total) by mouth  daily.   predniSONE  1 MG tablet Commonly known as: DELTASONE  TAKE 1 TABLET BY MOUTH DAILY What changed: Another medication with the same name was added. Make sure you understand how and when to take each.   predniSONE  10 MG (21) Tbpk tablet Commonly known as: STERAPRED UNI-PAK 21 TAB Use per package instruction What changed: You were already taking a medication with the same name, and this prescription was added. Make sure you understand how and when to take each.   torsemide  20 MG tablet Commonly known as: DEMADEX  Take 2 tablets (40 mg) oral daily, as well you may take an extra tablet (20 mg) as needed evening time if remains with shortness of breath and significant lower extremity edema.        Allergies[1]  Consultations: None   Procedures/Studies: DG Swallowing Func-Speech Pathology Result Date: 08/21/2024 Table formatting from the original result was not included. Images from the original result were not included. Modified Barium Swallow Study Patient Details Name: Deanna Rivera MRN: [id number] Date of Birth: Apr 10, 1933 Today's Date: 08/21/2024 HPI/PMH: HPI: 89 yo female presenting to ED 1/6 from ILF with generalized weakness, fatigue, and shortness of breath. MBS 11/05/22 shows mild-moderate pharyngeal dysphagia that is thought to be chronic in nature related to degenerative changes and  spurring in cervical vertebrae as well as generalized weakness. Persistent penetration, often to the vocal folds, occurred and elicited a frequent cough response with minimal aspiration. Function also impacted by significant vallecular residue. Of note, OP MBS was scheduled for 09/14/24. CXR 1/8 shows significantly increased R middle lobe opacity, consistent with pneumonia. Supplemental O2 needs increased 1/8 to 8 LPM via [place] but returned to 3 LPM 1/9. PMH includes paroxysmal A-fib, second degree AV block s/p pacemaker, HTN, PMR, hypothyroidism, dysphagia, moderate MR Clinical Impression: Pt exhibits severe  pharyngeal dysphagia, representing decreased function since previous MBS 10/2022. This is primarily caused by a prominent cricopharyngeus and degenerative changes of the cervical spine. Base of tongue retraction, anterior hyoid movement, and epiglottic inversion remain minimal, which contributes to decreased pharyngeal clearance and incomplete laryngeal closure. Thin and nectar thick liquids consistently enter the laryngeal vestibule and progress to the vocal folds during the swallow (PAS 5). Due to the amount of vallecular residue, the volume sitting on the vocal folds increases after the swallow and leads to aspiration of thin liquids after the swallow (PAS 7). This does not elicit a cough response and spontaneous throat clearance does not eject penetrates or aspirates. A chin tuck did not yield improved safety or efficiency. Discussed with MD and would recommend she continue current diet, using strategies to minimize the risk of adverse events. Will f/u to provide education to pt and her family. DIGEST Swallow Severity Rating*  Safety: 2  Efficiency: 3  Overall Pharyngeal Swallow Severity: 3 (severe) 1: mild; 2: moderate; 3: severe; 4: profound *The Dynamic Imaging Grade of Swallowing Toxicity is standardized for the head and neck cancer population, however, demonstrates promising clinical applications across populations to standardize the clinical rating of pharyngeal swallow safety and severity.  Factors that may increase risk of adverse event in presence of aspiration Noe & Lianne 2021): Factors that may increase risk of adverse event in presence of aspiration Noe & Lianne 2021): Poor general health and/or compromised immunity; Reduced cognitive function; Limited mobility; Frail or deconditioned; Weak cough Recommendations/Plan: Swallowing Evaluation Recommendations Swallowing Evaluation Recommendations Recommendations: PO diet PO Diet Recommendation: Dysphagia 3 (Mechanical soft); Thin liquids (Level  0) Liquid Administration via: Cup; Straw Medication Administration: Crushed with puree Supervision: Staff to assist with self-feeding; Intermittent supervision/cueing for swallowing strategies Swallowing strategies  : Minimize environmental distractions; Slow rate; Small bites/sips; Multiple dry swallows after each bite/sip Postural changes: Position pt fully upright for meals; Stay upright 30-60 min after meals Oral care recommendations: Oral care BID (2x/day); Oral care before PO Recommended consults: Consider Palliative care Treatment Plan Treatment Plan Treatment recommendations: Therapy as outlined in treatment plan below Follow-up recommendations: Skilled nursing-short term rehab (<3 hours/day) Functional status assessment: Patient has had a recent decline in their functional status and demonstrates the ability to make significant improvements in function in a reasonable and predictable amount of time. Treatment frequency: Min 2x/week Treatment duration: 2 weeks Interventions: Aspiration precaution training; Patient/family education; Diet toleration management by SLP Recommendations Recommendations for follow up therapy are one component of a multi-disciplinary discharge planning process, led by the attending physician.  Recommendations may be updated based on patient status, additional functional criteria and insurance authorization. Assessment: Orofacial Exam: Orofacial Exam Oral Cavity: Oral Hygiene: WFL Oral Cavity - Dentition: Adequate natural dentition Orofacial Anatomy: WFL Oral Motor/Sensory Function: WFL Anatomy: Anatomy: Suspected cervical osteophytes; Prominent cricopharyngeus Boluses Administered: Boluses Administered Boluses Administered: Thin liquids (Level 0); Mildly thick liquids (Level 2, nectar thick); Puree; Solid  Oral  Impairment Domain: Oral Impairment Domain Lip Closure: No labial escape Tongue control during bolus hold: Posterior escape of less than half of bolus Bolus  preparation/mastication: Slow prolonged chewing/mashing with complete recollection Bolus transport/lingual motion: Brisk tongue motion Oral residue: Trace residue lining oral structures Location of oral residue : Tongue; Palate Initiation of pharyngeal swallow : Posterior angle of the ramus  Pharyngeal Impairment Domain: Pharyngeal Impairment Domain Soft palate elevation: Trace column of contrast or air between SP and PW Laryngeal elevation: Partial superior movement of thyroid  cartilage/partial approximation of arytenoids to epiglottic petiole Anterior hyoid excursion: No anterior movement Epiglottic movement: No inversion Laryngeal vestibule closure: Incomplete, narrow column air/contrast in laryngeal vestibule Pharyngeal stripping wave : Present - diminished Pharyngeal contraction (A/P view only): N/A Pharyngoesophageal segment opening: Minimal distention/minimal duration, marked obstruction of flow Tongue base retraction: Narrow column of contrast or air between tongue base and PPW Pharyngeal residue: Majority of contrast within or on pharyngeal structures Location of pharyngeal residue: Tongue base; Valleculae; Pharyngeal wall; Pyriform sinuses; Diffuse (>3 areas)  Esophageal Impairment Domain: Esophageal Impairment Domain Esophageal clearance upright position: Esophageal retention Pill: No data recorded Penetration/Aspiration Scale Score: Penetration/Aspiration Scale Score 2.  Material enters airway, remains ABOVE vocal cords then ejected out: Puree; Solid 5.  Material enters airway, CONTACTS cords and not ejected out: Mildly thick liquids (Level 2, nectar thick) 7.  Material enters airway, passes BELOW cords and not ejected out despite cough attempt by patient: Thin liquids (Level 0) Compensatory Strategies: Compensatory Strategies Compensatory strategies: Yes Chin tuck: Ineffective Ineffective Chin Tuck: Thin liquid (Level 0); Mildly thick liquid (Level 2, nectar thick) Liquid wash: Ineffective Ineffective  Liquid Wash: Thin liquid (Level 0); Mildly thick liquid (Level 2, nectar thick)   General Information: Caregiver present: No  Diet Prior to this Study: Dysphagia 3 (mechanical soft); Thin liquids (Level 0)   Temperature : Normal   Respiratory Status: WFL   Supplemental O2: Nasal cannula   History of Recent Intubation: No  Behavior/Cognition: Alert; Cooperative Self-Feeding Abilities: Needs set-up for self-feeding Baseline vocal quality/speech: Normal Volitional Cough: Able to elicit Volitional Swallow: Able to elicit Exam Limitations: No limitations Goal Planning: Prognosis for improved oropharyngeal function: Fair Barriers to Reach Goals: Time post onset; Severity of deficits; Behavior No data recorded Patient/Family Stated Goal: none stated Consulted and agree with results and recommendations: Patient; Physician Pain: Pain Assessment Pain Assessment: No/denies pain End of Session: Start Time:SLP Start Time (ACUTE ONLY): 1507 Stop Time: SLP Stop Time (ACUTE ONLY): 1529 Time Calculation:SLP Time Calculation (min) (ACUTE ONLY): 22 min Charges: SLP Evaluations $ SLP Speech Visit: 1 Visit SLP Evaluations $MBS Swallow: 1 Procedure $Swallowing Treatment: 1 Procedure SLP visit diagnosis: SLP Visit Diagnosis: Dysphagia, pharyngeal phase (R13.13) Past Medical History: Past Medical History: Diagnosis Date  Cataract   Hypertension   Moderate aortic stenosis   Moderate mitral regurgitation   Persistent atrial fibrillation (HCC)   PMR (polymyalgia rheumatica)   Second degree AV block   Sick sinus syndrome (HCC)   Thyroid  disease  Past Surgical History: Past Surgical History: Procedure Laterality Date  ABDOMINAL HYSTERECTOMY    APPENDECTOMY    CESAREAN SECTION    x 3  CHOLECYSTECTOMY    PACEMAKER GENERATOR CHANGE  11/20/2018  [employer] Accolade MRI PPM implanted in [place]   PACEMAKER IMPLANT    PILONIDAL CYST EXCISION   [name], M.A., CCC-SLP Speech Language Pathology, Acute Rehabilitation Services Secure Chat  preferred [phone] 08/21/2024, 4:18 PM  DG CHEST PORT 1 VIEW Result Date: 08/20/2024  CLINICAL DATA:  Dyspnea EXAM: PORTABLE CHEST 1 VIEW COMPARISON:  August 18, 2024 FINDINGS: Stable cardiomegaly. Left-sided pacemaker is unchanged. Significantly increased right middle lobe opacity is noted most consistent with pneumonia. Stable left retrocardiac opacity suggesting probable atelectasis. Bony thorax is unremarkable. IMPRESSION: Significantly increased right middle lobe opacity is noted most consistent with pneumonia. Electronically Signed   By: Lynwood Landy Raddle M.D.   On: 08/20/2024 09:47   DG Chest 2 View Result Date: 08/18/2024 CLINICAL DATA:  Shortness of breath and cough EXAM: DG CHEST 2V COMPARISON:  CT scan of the chest 08/13/2023 FINDINGS: Similar degree of cardiomegaly. Left subclavian approach cardiac rhythm maintenance device. Leads project over the right atrium and right ventricle. No significant vascular congestion or overt pulmonary edema. Nonspecific patchy left basilar airspace opacity favored to reflect atelectasis. No acute osseous abnormality. IMPRESSION: 1. Left retrocardiac patchy airspace opacity may reflect atelectasis or infiltrate. Atelectasis is favored. 2. Stable cardiomegaly without evidence of acute pulmonary edema. 3. Unchanged position of left subclavian approach cardiac rhythm maintenance device. Electronically Signed   By: Wilkie Lent M.D.   On: 08/18/2024 12:59   CUP PACEART REMOTE DEVICE CHECK Result Date: 08/09/2024 PPM Scheduled remote reviewed. Normal device function.  Presenting rhythm: VP. Next remote transmission per protocol. - CS, CVRS  (Echo, Carotid, EGD, Colonoscopy, ERCP)    Subjective:   Discharge Exam: Vitals:   08/24/24 0955 08/24/24 1241  BP:  (!) 153/84  Pulse: 100 88  Resp:  20  Temp:  97.6 F (36.4 C)  SpO2:  95%   Vitals:   08/24/24 0600 08/24/24 0822 08/24/24 0955 08/24/24 1241  BP:  (!) 158/74  (!) 153/84  Pulse:  (!) 55 100 88   Resp: 20   20  Temp:  98.3 F (36.8 C)  97.6 F (36.4 C)  TempSrc:  Oral  Oral  SpO2:  92%  95%  Weight:      Height:        General:, Alert, confused, restless Cardiovascular: Regular, murmur present Respiratory: Good air entry today with scattered rhonchi Abdominal: Soft, NT, ND, bowel sounds + Extremities: 1 edema, significant bilateral distal feet discoloration in the setting of known venous insufficiency    The results of significant diagnostics from this hospitalization (including imaging, microbiology, ancillary and laboratory) are listed below for reference.     Microbiology: Recent Results (from the past 240 hours)  Resp panel by RT-PCR (RSV, Flu A&B, Covid) Anterior Nasal Swab     Status: Abnormal   Collection Time: 08/18/24  1:05 PM   Specimen: Anterior Nasal Swab  Result Value Ref Range Status   SARS Coronavirus 2 by RT PCR POSITIVE (A) NEGATIVE Final   Influenza A by PCR NEGATIVE NEGATIVE Final   Influenza B by PCR NEGATIVE NEGATIVE Final    Comment: (NOTE) The Xpert Xpress SARS-CoV-2/FLU/RSV plus assay is intended as an aid in the diagnosis of influenza from Nasopharyngeal swab specimens and should not be used as a sole basis for treatment. Nasal washings and aspirates are unacceptable for Xpert Xpress SARS-CoV-2/FLU/RSV testing.  Fact Sheet for Patients: bloggercourse.com  Fact Sheet for Healthcare Providers: seriousbroker.it  This test is not yet approved or cleared by the [place]  FDA and has been authorized for detection and/or diagnosis of SARS-CoV-2 by FDA under an Emergency Use Authorization (EUA). This EUA will remain in effect (meaning this test can be used) for the duration of the COVID-19 declaration under Section 564(b)(1) of the Act, 21 U.S.C.  section 360bbb-3(b)(1), unless the authorization is terminated or revoked.     Resp Syncytial Virus by PCR NEGATIVE NEGATIVE Final     Comment: (NOTE) Fact Sheet for Patients: bloggercourse.com  Fact Sheet for Healthcare Providers: seriousbroker.it  This test is not yet approved or cleared by the [place]  FDA and has been authorized for detection and/or diagnosis of SARS-CoV-2 by FDA under an Emergency Use Authorization (EUA). This EUA will remain in effect (meaning this test can be used) for the duration of the COVID-19 declaration under Section 564(b)(1) of the Act, 21 U.S.C. section 360bbb-3(b)(1), unless the authorization is terminated or revoked.  Performed at [facility], [address], [place], [place] [address]      Labs: BNP (last 3 results) No results for input(s): BNP in the last 8760 hours. Basic Metabolic Panel: Recent Labs  Lab 08/20/24 0352 08/21/24 0323 08/22/24 0451 08/23/24 0429 08/24/24 1118  NA 137 141 140 142 146*  K 3.9 3.7 4.0 3.6 3.4*  CL 96* 100 100 100 99  CO2 30 26 32 33* 36*  GLUCOSE 124* 113* 131* 127* 120*  BUN 37* 48* 48* 41* 34*  CREATININE 0.45 0.47 0.37* 0.40* 0.44  CALCIUM 11.1* 10.2 10.3 10.7* 10.6*  MG 2.3 2.2 2.2 2.1 2.1  PHOS 2.3* 4.0 2.7 1.9*  --    Liver Function Tests: No results for input(s): AST, ALT, ALKPHOS, BILITOT, PROT, ALBUMIN in the last 168 hours. No results for input(s): LIPASE, AMYLASE in the last 168 hours. No results for input(s): AMMONIA in the last 168 hours. CBC: Recent Labs  Lab 08/19/24 0523 08/20/24 0352 08/21/24 0323 08/22/24 0451 08/23/24 0429 08/24/24 1118  WBC 10.1 10.5 6.9 5.9 7.2 7.8  NEUTROABS 8.5* 9.3* 5.8 5.0 5.7  --   HGB 15.1* 15.3* 14.4 14.3 15.9* 15.9*  HCT 44.9 44.3 44.2 43.3 47.4* 48.5*  MCV 96.8 94.7 98.9 98.4 96.9 98.2  PLT 193 241 211 234 246 202   Cardiac Enzymes: No results for input(s): CKTOTAL, CKMB, CKMBINDEX, TROPONINI in the last 168 hours. BNP: Invalid input(s): POCBNP CBG: Recent Labs  Lab 08/23/24 1642   GLUCAP 165*   D-Dimer No results for input(s): DDIMER in the last 72 hours. Hgb A1c No results for input(s): HGBA1C in the last 72 hours. Lipid Profile No results for input(s): CHOL, HDL, LDLCALC, TRIG, CHOLHDL, LDLDIRECT in the last 72 hours. Thyroid  function studies No results for input(s): TSH, T4TOTAL, T3FREE, THYROIDAB in the last 72 hours.  Invalid input(s): FREET3 Anemia work up No results for input(s): VITAMINB12, FOLATE, FERRITIN, TIBC, IRON, RETICCTPCT in the last 72 hours. Urinalysis    Component Value Date/Time   COLORURINE YELLOW 12/24/2023 1219   APPEARANCEUR Cloudy (A) 12/24/2023 1219   LABSPEC 1.010 12/24/2023 1219   PHURINE 7.0 12/24/2023 1219   GLUCOSEU NEGATIVE 12/24/2023 1219   HGBUR MODERATE (A) 12/24/2023 1219   BILIRUBINUR negative 02/26/2024 1158   KETONESUR NEGATIVE 12/24/2023 1219   PROTEINUR Negative 02/26/2024 1158   PROTEINUR NEGATIVE 08/13/2023 1415   UROBILINOGEN negative (A) 02/26/2024 1158   UROBILINOGEN 1.0 12/24/2023 1219   NITRITE negative 02/26/2024 1158   NITRITE POSITIVE (A) 12/24/2023 1219   LEUKOCYTESUR Trace (A) 02/26/2024 1158   LEUKOCYTESUR SMALL (A) 12/24/2023 1219   Sepsis Labs Recent Labs  Lab 08/21/24 0323 08/22/24 0451 08/23/24 0429 08/24/24 1118  WBC 6.9 5.9 7.2 7.8   Microbiology Recent Results (from the past 240 hours)  Resp panel by RT-PCR (RSV, Flu A&B, Covid)  Anterior Nasal Swab     Status: Abnormal   Collection Time: 08/18/24  1:05 PM   Specimen: Anterior Nasal Swab  Result Value Ref Range Status   SARS Coronavirus 2 by RT PCR POSITIVE (A) NEGATIVE Final   Influenza A by PCR NEGATIVE NEGATIVE Final   Influenza B by PCR NEGATIVE NEGATIVE Final    Comment: (NOTE) The Xpert Xpress SARS-CoV-2/FLU/RSV plus assay is intended as an aid in the diagnosis of influenza from Nasopharyngeal swab specimens and should not be used as a sole basis for treatment. Nasal washings  and aspirates are unacceptable for Xpert Xpress SARS-CoV-2/FLU/RSV testing.  Fact Sheet for Patients: bloggercourse.com  Fact Sheet for Healthcare Providers: seriousbroker.it  This test is not yet approved or cleared by the [place]  FDA and has been authorized for detection and/or diagnosis of SARS-CoV-2 by FDA under an Emergency Use Authorization (EUA). This EUA will remain in effect (meaning this test can be used) for the duration of the COVID-19 declaration under Section 564(b)(1) of the Act, 21 U.S.C. section 360bbb-3(b)(1), unless the authorization is terminated or revoked.     Resp Syncytial Virus by PCR NEGATIVE NEGATIVE Final    Comment: (NOTE) Fact Sheet for Patients: bloggercourse.com  Fact Sheet for Healthcare Providers: seriousbroker.it  This test is not yet approved or cleared by the [place]  FDA and has been authorized for detection and/or diagnosis of SARS-CoV-2 by FDA under an Emergency Use Authorization (EUA). This EUA will remain in effect (meaning this test can be used) for the duration of the COVID-19 declaration under Section 564(b)(1) of the Act, 21 U.S.C. section 360bbb-3(b)(1), unless the authorization is terminated or revoked.  Performed at [facility], [address] [place], [place] [address]      Time coordinating discharge: Over 30 minutes  SIGNED:   Brayton Lye, MD  [facility] 08/24/2024, 3:33 PM Pager   If 7PM-7AM, please contact night-coverage [email or web address] Password [email or web address]    [1]  Allergies Allergen Reactions   Shellfish Protein-Containing Drug Products Shortness Of Breath and Other (See Comments)    Lips go numb   Epinephrine  Other (See Comments) and Hypertension    Makes me feel wired   Monosodium Glutamate Other (See Comments) and Hypertension    Makes me feel badly     Ciprofloxacin  Other (See Comments)    Allergic reaction not recalled  Intolerance   Dronedarone Other (See Comments)    Leg swelling, Leg swelling   Erythromycin Other (See Comments)    Allergic reaction not recalled   Sulfa Antibiotics Other (See Comments)    Allergic reaction not recalled   Sulfamethoxazole-Trimethoprim    Tetracyclines & Related Other (See Comments)    Allergic reaction not recalled

## 2024-08-24 NOTE — Progress Notes (Signed)
 VAST was consulted to place new PIV. Previous site infiltrated. Pt was hesitant to get new IV but understood need for IV abx. Only 1 viable vein found in LFA, 24G catheter placed and flushed adequately.  Pt was instructed to keep arm straight as the IV pump would occlude if pt bent arm, but pt continued to bend arm while VAST was still in room.  No other suitable veins for PIVs found. Please consult MD in case current IV fails.

## 2024-08-24 NOTE — Progress Notes (Addendum)
 Pt is AX4. Refused taking meds. Pt said that I'm not taking these meds. You are trying to kill me! RN explained these meds gonna help her but she adamantly refused. MD notified.

## 2024-08-24 NOTE — Progress Notes (Signed)
 Administered metoprolol  iv for hr=110.  Amado GORMAN Arabia, RN

## 2024-08-25 ENCOUNTER — Telehealth: Payer: Self-pay

## 2024-08-25 NOTE — Telephone Encounter (Signed)
 Please advise on being hospice provider. Please and thank you.  Copied from CRM [id number]. Topic: General - Other >> Aug 25, 2024 11:53 AM Avram MATSU wrote: Reason for CRM: patient would like her MD to be her hospice primary care provider. agreeing patient has a terminal illness with life expectancy of 6 month or less. Pt is being evaluated today for hospices service at 12:45pm please advise [phone]

## 2024-08-25 NOTE — Transitions of Care (Post Inpatient/ED Visit) (Signed)
" ° °  08/25/2024  Name: Mycah Floresca MRN: [id number] DOB: 26-Jun-1933  Today's TOC FU Call Status: Today's TOC FU Call Status:: Successful TOC FU Call Completed TOC FU Call Complete Date: 08/24/24  Patient's Name and Date of Birth confirmed. Name, DOB  Transition Care Management Follow-up Telephone Call Date of Discharge: 08/25/24 Discharge Facility: [facility]) Type of Discharge: Inpatient Admission Primary Inpatient Discharge Diagnosis:: Acute hypoxic respiratory failure  1332pm   Phone call with son, Franky who states that family is currently meeting with hospice and he would like a call back.    1511: call back to son, Franky who states that patient is not enrolled in hospice care. No other concerns noted.      Alan Ee, RN, BSN, CEN Population Health- Transition of Care Team.  [facility] [phone]  "

## 2024-08-26 ENCOUNTER — Telehealth: Payer: Self-pay | Admitting: Family Medicine

## 2024-08-26 NOTE — Telephone Encounter (Signed)
 [facility] faxed Hospice Certification and Plan of care (Order ID [phone]), to be filled out by provider. [facility] requested to send it back via Fax within ASAP. Document is located in providers tray at front office.Please advise at [phone].

## 2024-08-27 ENCOUNTER — Telehealth: Payer: Self-pay

## 2024-08-27 NOTE — Telephone Encounter (Signed)
 Form placed in providers box.

## 2024-08-27 NOTE — Telephone Encounter (Signed)
 Copied from CRM [id number]. Topic: Clinical - Medication Question >> Aug 27, 2024 10:13 AM China J wrote: Reason for CRM: Hospice will not cover apixaban  (ELIQUIS ) 2.5 MG TABS tablet. Instead, they are recommending Asprin. The patient's son Abigail) is wanting to speak with Dr. Jodie or her nurse/medical assistant about a list of alternatives she recommends.  Please call her son Omar at [phone]

## 2024-08-28 ENCOUNTER — Telehealth: Payer: Self-pay

## 2024-08-28 NOTE — Telephone Encounter (Signed)
 Copied from CRM [id number]. Topic: Clinical - Medication Question >> Aug 28, 2024 10:54 AM Mia F wrote: Reason for CRM: Obie Planas with [facility] care called in stating pt was in the hospital for respitory failure and pnumonia and was sent home to take doxycycline  (VIBRA -TABS) 100 MG tablet and predniSONE  (DELTASONE ) 1 MG tablet and predniSONE  (STERAPRED UNI-PAK 21 TAB) 10 MG (21) TBPK tablet but did not send the medication to the pharmacy. Planas called the hospital to get the medication sent but was told at this point pt pcp need to be contacted. Pt would like for these to be sent to [facility] [id number] [place], [place] - [address] [place] [address] Phone: [phone] Fax: [phone]  Please call pt or her son Franky back about this [phone]

## 2024-08-28 NOTE — Telephone Encounter (Signed)
 Please advise.

## 2024-08-31 ENCOUNTER — Telehealth: Payer: Self-pay | Admitting: Family Medicine

## 2024-09-07 ENCOUNTER — Ambulatory Visit (HOSPITAL_BASED_OUTPATIENT_CLINIC_OR_DEPARTMENT_OTHER): Admitting: Family

## 2024-09-13 NOTE — Telephone Encounter (Signed)
 Pt did not receive liquid antibiotic.   Patient Name First: Deanna Last: Rivera Gender: Female DOB: 1933/05/27 Age: 89 Y 6 M 24 D Return Phone Number: [phone] (Primary) Address: City/ State/ Zip: [place]  [address] Client [facility] - Human Resources Officer [facility] Night Provider Jodie Gammons- MD Contact Type Call Who Is Calling Patient / Member / Family / Caregiver Call Type Triage / Clinical Caller Name Mitsuko Luera Caller Phone Number [phone] Return Phone Number [phone] (Primary) Chief Complaint BREATHING - shortness of breath or sounds breathless Reason for Call Symptomatic / Request for Health Information Initial Comment Caller states her Mother in law was in the hospital for PNA. She was released to hospice care in her home. When she was moved back to her appt, she did not get her liquid antibiotic for her PNA because she has an issue swallowing. Pharmacy does not have it. She still has liquid in her lungs, and laying on her back and is giving her difficulty breathing. Translation No Nurse Assessment Nurse: Gorden, RN, Daine Date/Time (Eastern Time): 08/29/2024 9:33:31 AM Confirm and document reason for call. If symptomatic, describe symptoms. ---Caller states antibiotic and steroid was to be sent home with her from hospital yesterday. hospice nurse called and scripts were to be sent. She was released to hospice care in her home. she had covid and pneumonia. Pharmacy did not receive script from office. has 24 hr care givers at home. caller is not with [name]. Does the patient have any new or worsening symptoms? ---No Please document clinical information provided and list any resource used. ---son will call hospice nurse about the meds. [place] not available for triage at this time Disp. Time Titus Time) Disposition Final User 08/29/2024 9:29:54 AM Send to Urgent Queue  Myrna Ladora Caldron 08/29/2024 9:39:11 AM Clinical Call Yes Gorden, RN, Daine Final Disposition 08/29/2024 9:39:11 AM Clinical Call Yes Gorden, RN, Tracie

## 2024-09-13 DEATH — deceased

## 2024-09-14 ENCOUNTER — Encounter (HOSPITAL_COMMUNITY)

## 2024-09-25 ENCOUNTER — Ambulatory Visit: Admitting: Neurology

## 2024-10-29 ENCOUNTER — Ambulatory Visit: Admitting: Physician Assistant

## 2024-11-06 ENCOUNTER — Ambulatory Visit

## 2025-01-14 ENCOUNTER — Ambulatory Visit: Payer: Self-pay | Admitting: Neurology

## 2025-02-05 ENCOUNTER — Ambulatory Visit

## 2025-03-01 ENCOUNTER — Ambulatory Visit

## 2025-05-07 ENCOUNTER — Ambulatory Visit

## 2025-08-06 ENCOUNTER — Ambulatory Visit

## 2025-11-05 ENCOUNTER — Ambulatory Visit

## 2098-08-12 ENCOUNTER — Ambulatory Visit: Admission: RE | Admit: 2098-08-12 | Payer: Medicare Other | Source: Ambulatory Visit | Admitting: Cardiovascular Disease
